# Patient Record
Sex: Male | Born: 1970 | Race: White | Hispanic: No | Marital: Single | Smoking: Never smoker
Health system: Southern US, Community
[De-identification: ages and names within clinical notes are randomized; demographics above are authoritative.]

## PROBLEM LIST (undated history)

## (undated) DIAGNOSIS — I219 Acute myocardial infarction, unspecified: Secondary | ICD-10-CM

## (undated) DIAGNOSIS — I499 Cardiac arrhythmia, unspecified: Secondary | ICD-10-CM

## (undated) DIAGNOSIS — N183 Chronic kidney disease, stage 3 unspecified: Secondary | ICD-10-CM

## (undated) DIAGNOSIS — I82402 Acute embolism and thrombosis of unspecified deep veins of left lower extremity: Secondary | ICD-10-CM

## (undated) DIAGNOSIS — L02619 Cutaneous abscess of unspecified foot: Secondary | ICD-10-CM

## (undated) DIAGNOSIS — L03119 Cellulitis of unspecified part of limb: Secondary | ICD-10-CM

## (undated) DIAGNOSIS — M869 Osteomyelitis, unspecified: Secondary | ICD-10-CM

## (undated) DIAGNOSIS — I10 Essential (primary) hypertension: Secondary | ICD-10-CM

## (undated) DIAGNOSIS — I509 Heart failure, unspecified: Secondary | ICD-10-CM

## (undated) DIAGNOSIS — E119 Type 2 diabetes mellitus without complications: Secondary | ICD-10-CM

## (undated) DIAGNOSIS — K2970 Gastritis, unspecified, without bleeding: Secondary | ICD-10-CM

## (undated) DIAGNOSIS — K219 Gastro-esophageal reflux disease without esophagitis: Secondary | ICD-10-CM

## (undated) HISTORY — PX: FOOT SURGERY: SHX648

---

## 2004-11-06 ENCOUNTER — Emergency Department: Payer: Self-pay | Admitting: Internal Medicine

## 2004-11-06 ENCOUNTER — Other Ambulatory Visit: Payer: Self-pay

## 2004-12-15 ENCOUNTER — Emergency Department: Payer: Self-pay | Admitting: Emergency Medicine

## 2005-01-24 ENCOUNTER — Emergency Department (HOSPITAL_COMMUNITY): Admission: EM | Admit: 2005-01-24 | Discharge: 2005-01-24 | Payer: Self-pay | Admitting: Family Medicine

## 2005-08-31 ENCOUNTER — Emergency Department: Payer: Self-pay | Admitting: Emergency Medicine

## 2006-01-22 ENCOUNTER — Emergency Department: Payer: Self-pay | Admitting: Emergency Medicine

## 2006-04-30 ENCOUNTER — Inpatient Hospital Stay: Payer: Self-pay

## 2006-08-14 ENCOUNTER — Emergency Department: Payer: Self-pay | Admitting: Emergency Medicine

## 2006-11-12 ENCOUNTER — Emergency Department: Payer: Self-pay | Admitting: Emergency Medicine

## 2007-02-02 ENCOUNTER — Emergency Department: Payer: Self-pay | Admitting: Internal Medicine

## 2007-05-23 ENCOUNTER — Emergency Department: Payer: Self-pay | Admitting: Emergency Medicine

## 2007-05-25 ENCOUNTER — Emergency Department: Payer: Self-pay | Admitting: Emergency Medicine

## 2007-06-12 ENCOUNTER — Emergency Department: Payer: Self-pay | Admitting: Emergency Medicine

## 2007-07-15 ENCOUNTER — Inpatient Hospital Stay: Payer: Self-pay | Admitting: Internal Medicine

## 2007-07-15 ENCOUNTER — Other Ambulatory Visit: Payer: Self-pay

## 2008-08-16 ENCOUNTER — Emergency Department: Payer: Self-pay | Admitting: Emergency Medicine

## 2008-09-01 ENCOUNTER — Emergency Department: Payer: Self-pay | Admitting: Emergency Medicine

## 2010-08-09 ENCOUNTER — Emergency Department: Payer: Self-pay | Admitting: Emergency Medicine

## 2010-10-31 ENCOUNTER — Emergency Department: Payer: Self-pay | Admitting: Emergency Medicine

## 2011-09-28 ENCOUNTER — Emergency Department: Payer: Self-pay | Admitting: Unknown Physician Specialty

## 2012-02-21 ENCOUNTER — Ambulatory Visit: Payer: Self-pay

## 2013-06-17 ENCOUNTER — Inpatient Hospital Stay: Payer: Self-pay | Admitting: Family Medicine

## 2013-06-17 LAB — CBC
HGB: 10.8 g/dL — ABNORMAL LOW (ref 13.0–18.0)
MCHC: 34 g/dL (ref 32.0–36.0)
MCV: 90 fL (ref 80–100)
RBC: 3.55 10*6/uL — ABNORMAL LOW (ref 4.40–5.90)
RDW: 12.5 % (ref 11.5–14.5)
WBC: 7.7 10*3/uL (ref 3.8–10.6)

## 2013-06-17 LAB — URINALYSIS, COMPLETE
Bilirubin,UR: NEGATIVE
Blood: NEGATIVE
Glucose,UR: 50 mg/dL (ref 0–75)
Hyaline Cast: 28
Ketone: NEGATIVE
Leukocyte Esterase: NEGATIVE
Nitrite: NEGATIVE
Protein: 75
RBC,UR: <1 /HPF (ref 0–5)
Specific Gravity: 1.015 (ref 1.003–1.030)
Squamous Epithelial: <1
WBC UR: <1 /HPF (ref 0–5)

## 2013-06-17 LAB — IRON AND TIBC
Iron Bind.Cap.(Total): 311 ug/dL (ref 250–450)
Iron: 76 ug/dL (ref 65–175)
Unbound Iron-Bind.Cap.: 235 ug/dL

## 2013-06-17 LAB — COMPREHENSIVE METABOLIC PANEL
Albumin: 3.9 g/dL (ref 3.4–5.0)
Alkaline Phosphatase: 70 U/L (ref 50–136)
Anion Gap: 9 (ref 7–16)
Bilirubin,Total: 0.2 mg/dL (ref 0.2–1.0)
Calcium, Total: 9.8 mg/dL (ref 8.5–10.1)
Chloride: 106 mmol/L (ref 98–107)
Co2: 23 mmol/L (ref 21–32)
Creatinine: 3.28 mg/dL — ABNORMAL HIGH (ref 0.60–1.30)
EGFR (African American): 25 — ABNORMAL LOW
Glucose: 372 mg/dL — ABNORMAL HIGH (ref 65–99)
Osmolality: 316 (ref 275–301)
SGOT(AST): 13 U/L — ABNORMAL LOW (ref 15–37)
SGPT (ALT): 27 U/L (ref 12–78)
Sodium: 138 mmol/L (ref 136–145)

## 2013-06-17 LAB — TROPONIN I: Troponin-I: <0.02 ng/mL

## 2013-06-18 DIAGNOSIS — I6789 Other cerebrovascular disease: Secondary | ICD-10-CM

## 2013-06-18 LAB — BASIC METABOLIC PANEL
BUN: 70 mg/dL — ABNORMAL HIGH (ref 7–18)
Calcium, Total: 8.9 mg/dL (ref 8.5–10.1)
Chloride: 116 mmol/L — ABNORMAL HIGH (ref 98–107)
Creatinine: 2.46 mg/dL — ABNORMAL HIGH (ref 0.60–1.30)
EGFR (Non-African Amer.): 31 — ABNORMAL LOW
Glucose: 126 mg/dL — ABNORMAL HIGH (ref 65–99)
Osmolality: 307 (ref 275–301)
Potassium: 4.4 mmol/L (ref 3.5–5.1)
Sodium: 143 mmol/L (ref 136–145)

## 2013-06-18 LAB — CBC WITH DIFFERENTIAL/PLATELET
Basophil #: 0.1 10*3/uL (ref 0.0–0.1)
Eosinophil #: 0.2 10*3/uL (ref 0.0–0.7)
Eosinophil %: 2.7 %
HCT: 28.9 % — ABNORMAL LOW (ref 40.0–52.0)
MCH: 30.4 pg (ref 26.0–34.0)
MCHC: 34.2 g/dL (ref 32.0–36.0)
MCV: 89 fL (ref 80–100)
Monocyte #: 0.5 x10 3/mm (ref 0.2–1.0)
Neutrophil %: 63.6 %
RBC: 3.25 10*6/uL — ABNORMAL LOW (ref 4.40–5.90)
WBC: 7.5 10*3/uL (ref 3.8–10.6)

## 2013-06-18 LAB — LIPID PANEL
Cholesterol: 164 mg/dL (ref 0–200)
Ldl Cholesterol, Calc: 117 mg/dL — ABNORMAL HIGH (ref 0–100)
Triglycerides: 98 mg/dL (ref 0–200)

## 2013-06-18 LAB — HEMOGLOBIN A1C: Hemoglobin A1C: 8.7 % — ABNORMAL HIGH (ref 4.2–6.3)

## 2014-03-31 ENCOUNTER — Emergency Department: Payer: Self-pay | Admitting: Emergency Medicine

## 2014-03-31 LAB — CBC WITH DIFFERENTIAL/PLATELET
Basophil #: 0 10*3/uL (ref 0.0–0.1)
Basophil %: 0.4 %
EOS PCT: 1.6 %
Eosinophil #: 0.2 10*3/uL (ref 0.0–0.7)
HCT: 37.2 % — AB (ref 40.0–52.0)
HGB: 12.4 g/dL — ABNORMAL LOW (ref 13.0–18.0)
Lymphocyte #: 0.6 10*3/uL — ABNORMAL LOW (ref 1.0–3.6)
Lymphocyte %: 6.5 %
MCH: 29.9 pg (ref 26.0–34.0)
MCHC: 33.3 g/dL (ref 32.0–36.0)
MCV: 90 fL (ref 80–100)
MONOS PCT: 4.8 %
Monocyte #: 0.5 x10 3/mm (ref 0.2–1.0)
Neutrophil #: 8.5 10*3/uL — ABNORMAL HIGH (ref 1.4–6.5)
Neutrophil %: 86.7 %
PLATELETS: 208 10*3/uL (ref 150–440)
RBC: 4.14 10*6/uL — ABNORMAL LOW (ref 4.40–5.90)
RDW: 13.2 % (ref 11.5–14.5)
WBC: 9.8 10*3/uL (ref 3.8–10.6)

## 2014-03-31 LAB — URINALYSIS, COMPLETE
BILIRUBIN, UR: NEGATIVE
Bacteria: NONE SEEN
GLUCOSE, UR: NEGATIVE mg/dL (ref 0–75)
KETONE: NEGATIVE
LEUKOCYTE ESTERASE: NEGATIVE
NITRITE: NEGATIVE
Ph: 5 (ref 4.5–8.0)
Protein: >=500
RBC,UR: 1 /HPF (ref 0–5)
Specific Gravity: 1.016 (ref 1.003–1.030)
Squamous Epithelial: NONE SEEN

## 2014-03-31 LAB — TROPONIN I: Troponin-I: <0.02 ng/mL

## 2014-03-31 LAB — COMPREHENSIVE METABOLIC PANEL
ALBUMIN: 3.7 g/dL (ref 3.4–5.0)
ALT: 26 U/L (ref 12–78)
AST: 21 U/L (ref 15–37)
Alkaline Phosphatase: 81 U/L
Anion Gap: 5 — ABNORMAL LOW (ref 7–16)
BILIRUBIN TOTAL: 0.4 mg/dL (ref 0.2–1.0)
BUN: 36 mg/dL — AB (ref 7–18)
CALCIUM: 8.8 mg/dL (ref 8.5–10.1)
CO2: 22 mmol/L (ref 21–32)
Chloride: 114 mmol/L — ABNORMAL HIGH (ref 98–107)
Creatinine: 2.04 mg/dL — ABNORMAL HIGH (ref 0.60–1.30)
EGFR (African American): 44 — ABNORMAL LOW
EGFR (Non-African Amer.): 38 — ABNORMAL LOW
Glucose: 116 mg/dL — ABNORMAL HIGH (ref 65–99)
Osmolality: 291 (ref 275–301)
POTASSIUM: 4.6 mmol/L (ref 3.5–5.1)
Sodium: 141 mmol/L (ref 136–145)
TOTAL PROTEIN: 8 g/dL (ref 6.4–8.2)

## 2014-03-31 LAB — LIPASE, BLOOD: Lipase: 150 U/L (ref 73–393)

## 2014-07-19 ENCOUNTER — Emergency Department: Payer: Self-pay | Admitting: Emergency Medicine

## 2014-07-26 ENCOUNTER — Inpatient Hospital Stay: Payer: Self-pay | Admitting: Internal Medicine

## 2014-07-26 LAB — POTASSIUM
Potassium: 6.1 mmol/L — ABNORMAL HIGH (ref 3.5–5.1)
Potassium: 5.7 mmol/L — ABNORMAL HIGH (ref 3.5–5.1)

## 2014-07-26 LAB — URINALYSIS, COMPLETE
BACTERIA: NONE SEEN
BLOOD: NEGATIVE
Bilirubin,UR: NEGATIVE
Glucose,UR: NEGATIVE mg/dL (ref 0–75)
Ketone: NEGATIVE
LEUKOCYTE ESTERASE: NEGATIVE
Nitrite: NEGATIVE
PH: 5 (ref 4.5–8.0)
Protein: 100
Specific Gravity: 1.012 (ref 1.003–1.030)
Squamous Epithelial: NONE SEEN
WBC UR: NONE SEEN /HPF (ref 0–5)

## 2014-07-26 LAB — CBC WITH DIFFERENTIAL/PLATELET
BASOS PCT: 1.1 %
Basophil #: 0.1 10*3/uL (ref 0.0–0.1)
Eosinophil #: 0.2 10*3/uL (ref 0.0–0.7)
Eosinophil %: 2.6 %
HCT: 31.4 % — ABNORMAL LOW (ref 40.0–52.0)
HGB: 9.8 g/dL — AB (ref 13.0–18.0)
LYMPHS ABS: 1.4 10*3/uL (ref 1.0–3.6)
Lymphocyte %: 23.8 %
MCH: 28.9 pg (ref 26.0–34.0)
MCHC: 31.2 g/dL — AB (ref 32.0–36.0)
MCV: 93 fL (ref 80–100)
Monocyte #: 0.4 x10 3/mm (ref 0.2–1.0)
Monocyte %: 7.1 %
NEUTROS ABS: 4 10*3/uL (ref 1.4–6.5)
NEUTROS PCT: 65.4 %
Platelet: 201 10*3/uL (ref 150–440)
RBC: 3.39 10*6/uL — ABNORMAL LOW (ref 4.40–5.90)
RDW: 13.5 % (ref 11.5–14.5)
WBC: 6.1 10*3/uL (ref 3.8–10.6)

## 2014-07-26 LAB — BASIC METABOLIC PANEL
Anion Gap: 6 — ABNORMAL LOW (ref 7–16)
BUN: 60 mg/dL — ABNORMAL HIGH (ref 7–18)
Calcium, Total: 8.6 mg/dL (ref 8.5–10.1)
Chloride: 114 mmol/L — ABNORMAL HIGH (ref 98–107)
Co2: 21 mmol/L (ref 21–32)
Creatinine: 4.03 mg/dL — ABNORMAL HIGH (ref 0.60–1.30)
EGFR (African American): 21 — ABNORMAL LOW
GFR CALC NON AF AMER: 17 — AB
GLUCOSE: 72 mg/dL (ref 65–99)
OSMOLALITY: 297 (ref 275–301)
Potassium: 6.8 mmol/L (ref 3.5–5.1)
Sodium: 141 mmol/L (ref 136–145)

## 2014-07-26 LAB — PROTIME-INR
INR: 1
PROTHROMBIN TIME: 12.7 s (ref 11.5–14.7)

## 2014-07-26 LAB — TROPONIN I: Troponin-I: <0.02 ng/mL

## 2014-07-26 LAB — CK TOTAL AND CKMB (NOT AT ARMC)
CK, TOTAL: 281 U/L
CK-MB: 5.6 ng/mL — ABNORMAL HIGH (ref 0.5–3.6)

## 2014-07-27 LAB — BASIC METABOLIC PANEL
Anion Gap: 6 — ABNORMAL LOW (ref 7–16)
BUN: 49 mg/dL — AB (ref 7–18)
CO2: 24 mmol/L (ref 21–32)
CREATININE: 3.26 mg/dL — AB (ref 0.60–1.30)
Calcium, Total: 8.6 mg/dL (ref 8.5–10.1)
Chloride: 114 mmol/L — ABNORMAL HIGH (ref 98–107)
EGFR (Non-African Amer.): 22 — ABNORMAL LOW
GFR CALC AF AMER: 27 — AB
Glucose: 84 mg/dL (ref 65–99)
Osmolality: 299 (ref 275–301)
Potassium: 5 mmol/L (ref 3.5–5.1)
Sodium: 144 mmol/L (ref 136–145)

## 2014-07-27 LAB — CBC WITH DIFFERENTIAL/PLATELET
Basophil #: 0.1 10*3/uL (ref 0.0–0.1)
Basophil %: 1 %
Eosinophil #: 0.2 10*3/uL (ref 0.0–0.7)
Eosinophil %: 3.2 %
HCT: 27.3 % — AB (ref 40.0–52.0)
HGB: 8.6 g/dL — ABNORMAL LOW (ref 13.0–18.0)
LYMPHS PCT: 33.8 %
Lymphocyte #: 1.9 10*3/uL (ref 1.0–3.6)
MCH: 29 pg (ref 26.0–34.0)
MCHC: 31.3 g/dL — ABNORMAL LOW (ref 32.0–36.0)
MCV: 93 fL (ref 80–100)
MONOS PCT: 7.6 %
Monocyte #: 0.4 x10 3/mm (ref 0.2–1.0)
Neutrophil #: 3.1 10*3/uL (ref 1.4–6.5)
Neutrophil %: 54.4 %
Platelet: 164 10*3/uL (ref 150–440)
RBC: 2.95 10*6/uL — AB (ref 4.40–5.90)
RDW: 13.5 % (ref 11.5–14.5)
WBC: 5.7 10*3/uL (ref 3.8–10.6)

## 2014-07-27 LAB — FERRITIN
FERRITIN (ARMC): 83 ng/mL (ref 8–388)
FERRITIN (ARMC): 84 ng/mL (ref 8–388)

## 2014-07-27 LAB — IRON AND TIBC
IRON: 52 ug/dL — AB (ref 65–175)
IRON: 66 ug/dL (ref 65–175)
Iron Bind.Cap.(Total): 239 ug/dL — ABNORMAL LOW (ref 250–450)
Iron Bind.Cap.(Total): 212 ug/dL — ABNORMAL LOW (ref 250–450)
Iron Saturation: 22 %
Iron Saturation: 31 %
UNBOUND IRON-BIND. CAP.: 187 ug/dL
Unbound Iron-Bind.Cap.: 146 ug/dL

## 2014-07-27 LAB — PHOSPHORUS: Phosphorus: 3.3 mg/dL (ref 2.5–4.9)

## 2014-07-28 LAB — RENAL FUNCTION PANEL
ALBUMIN: 2.8 g/dL — AB (ref 3.4–5.0)
Anion Gap: 5 — ABNORMAL LOW (ref 7–16)
BUN: 36 mg/dL — AB (ref 7–18)
CALCIUM: 8.2 mg/dL — AB (ref 8.5–10.1)
CO2: 25 mmol/L (ref 21–32)
Chloride: 113 mmol/L — ABNORMAL HIGH (ref 98–107)
Creatinine: 2.53 mg/dL — ABNORMAL HIGH (ref 0.60–1.30)
GFR CALC AF AMER: 36 — AB
GFR CALC NON AF AMER: 29 — AB
Glucose: 144 mg/dL — ABNORMAL HIGH (ref 65–99)
OSMOLALITY: 296 (ref 275–301)
POTASSIUM: 5.3 mmol/L — AB (ref 3.5–5.1)
Phosphorus: 3.8 mg/dL (ref 2.5–4.9)
SODIUM: 143 mmol/L (ref 136–145)

## 2014-07-28 LAB — HEMOGLOBIN A1C: Hemoglobin A1C: 7 % — ABNORMAL HIGH (ref 4.2–6.3)

## 2014-07-29 LAB — BASIC METABOLIC PANEL
Anion Gap: 6 — ABNORMAL LOW (ref 7–16)
BUN: 34 mg/dL — AB (ref 7–18)
CALCIUM: 8.8 mg/dL (ref 8.5–10.1)
CO2: 25 mmol/L (ref 21–32)
Chloride: 110 mmol/L — ABNORMAL HIGH (ref 98–107)
Creatinine: 2.4 mg/dL — ABNORMAL HIGH (ref 0.60–1.30)
EGFR (Non-African Amer.): 31 — ABNORMAL LOW
GFR CALC AF AMER: 38 — AB
Glucose: 207 mg/dL — ABNORMAL HIGH (ref 65–99)
OSMOLALITY: 295 (ref 275–301)
Potassium: 5.4 mmol/L — ABNORMAL HIGH (ref 3.5–5.1)
SODIUM: 141 mmol/L (ref 136–145)

## 2014-07-29 LAB — PROTEIN / CREATININE RATIO, URINE
Creatinine, Urine: 85.9 mg/dL (ref 30.0–125.0)
PROTEIN/CREAT. RATIO: 896 mg/g{creat} — AB (ref 0–200)
Protein, Random Urine: 77 mg/dL — ABNORMAL HIGH (ref 0–12)

## 2014-07-29 LAB — PROTEIN ELECTROPHORESIS(ARMC)

## 2014-07-31 LAB — UR PROT ELECTROPHORESIS, URINE RANDOM

## 2014-07-31 LAB — CULTURE, BLOOD (SINGLE)

## 2014-09-18 ENCOUNTER — Ambulatory Visit: Payer: Self-pay | Admitting: Nephrology

## 2014-09-18 LAB — BASIC METABOLIC PANEL
ANION GAP: 7 (ref 7–16)
BUN: 52 mg/dL — ABNORMAL HIGH (ref 7–18)
CALCIUM: 9 mg/dL (ref 8.5–10.1)
CREATININE: 2.5 mg/dL — AB (ref 0.60–1.30)
Chloride: 111 mmol/L — ABNORMAL HIGH (ref 98–107)
Co2: 22 mmol/L (ref 21–32)
GFR CALC AF AMER: 36 — AB
GFR CALC NON AF AMER: 30 — AB
Glucose: 270 mg/dL — ABNORMAL HIGH (ref 65–99)
Osmolality: 303 (ref 275–301)
Potassium: 5.6 mmol/L — ABNORMAL HIGH (ref 3.5–5.1)
Sodium: 140 mmol/L (ref 136–145)

## 2014-09-28 ENCOUNTER — Ambulatory Visit: Payer: Self-pay | Admitting: Pain Medicine

## 2014-10-06 ENCOUNTER — Ambulatory Visit: Payer: Self-pay | Admitting: Internal Medicine

## 2014-10-07 ENCOUNTER — Ambulatory Visit: Payer: Self-pay | Admitting: Pain Medicine

## 2014-10-14 ENCOUNTER — Ambulatory Visit: Payer: Self-pay | Admitting: Pain Medicine

## 2014-11-02 ENCOUNTER — Ambulatory Visit: Payer: Self-pay | Admitting: Internal Medicine

## 2014-11-09 ENCOUNTER — Emergency Department: Payer: Self-pay | Admitting: Student

## 2015-01-22 NOTE — H&P (Signed)
PATIENT NAME:  Juan Roberson, Juan Roberson MR#:  [id number] DATE OF BIRTH:  May 16, 1971  DATE OF ADMISSION:  06/17/2013  REFERRING PHYSICIAN:  Dr. Chaney Malling  PRIMARY CARE PHYSICIAN:  Dr. Loma Sender  CHIEF COMPLAINT: Slurred speech, weakness of the right side of the body lasting for 5 minutes 3 times today.   HISTORY OF PRESENT ILLNESS: A very nice 44 year old gentleman with history of diabetes. He comes in today with the main complaint of being at work, being his normal self this morning, and around 1:15 started developing acute numbness at the level of his right hand. He stopped working to rest, sit down, and then his right arm became numb completely as well. He went to talk to one of his coworkers, and they noticed that his speech was very slurred. He was not able to put words together for a little while, and then while he was standing, his right leg became numb as well. This episode lasted 5 minutes, and then within the next hour repeated 3 times again to the point that he was not able to continue his labor. He was very concerned, and came to the ER. In the ER department, he was evaluated. His CT scan of the head did not show any acute abnormalities. He had normal cardiac enzymes, but his creatinine was 3.28. His previous creatinine in 2012 was 1.3. The patient states that his blood sugars have not been very well-controlled, despite the fact that he has been taking is Glyburide. Within the last 6 months, he has developed some edema of the lower extremities. He asked his mother-in-law what to do, and she offered him her water pills. He started taking it for 3 to 4 days, and he started urinating a lot. He did not consult his primary care physician about this action. The patient overall says at this moment he is back to his normal self. He has not had any more episodes of slurred speech or right-sided numbness or weakness, and but he is concerned about this situation. The patient is admitted for evaluation and  observation of these issues, and also for treatment of his acute kidney injury.   REVIEW OF SYSTEMS:  A 12-system review of systems.  CONSTITUTIONAL: Denies fever, fatigue, weight loss or weight gain.  EYES: No double vision, blurry vision or erythema.  ENT: No difficulty swallowing. No tinnitus.  RESPIRATORY: No shortness of breath, cough, wheezing, or hemoptysis.  CARDIOVASCULAR: No chest pain, orthopnea or arrhythmia. The patient has edema of the lower extremities that apparently has been really severe to the point that he is not able to put on his shoes, that resolved after taking the medication given by his mother-in-law, which apparently might be Lasix.  GENITOURINARY: No dysuria, hematuria or changes in frequency.  GASTROINTESTINAL: No nausea, vomiting, or diarrhea. The patient had mild constipation within the last week.  ENDOCRINE: No polyuria, polydipsia or polyphagia. His blood sugars are not well-controlled. He does not have any thyroid disease.  HEMATOLOGIC AND LYMPHATIC: No anemia, easy bruising or swollen glands.  MUSCULOSKELETAL: No significant joint pain, gout, or deformity.  NEUROLOGIC: No numbness, tingling. No vertigo, no ataxia. Positive TIA episode mentioned above today. No memory loss. No seizures.  PSYCHIATRIC: No anxiety, depression or insomnia.   PAST MEDICAL HISTORY: 1.  Type 2 non-insulin-dependent diabetes.  2.  Apparently the patient was taking blood pressure medications that he stopped by himself.  3.  He denies any other medical problems.   ALLERGIES:  No known drug  allergies.   MEDICATIONS:  Glyburide 1 p.o. twice daily. He does not remember what dose he is taking.   FAMILY HISTORY:  Positive for MI in his father, who has had quadruple bypass. Cancer runs in his family. Lung cancer in his brother, who was a smoker. Sister had breast cancer. His mom had pancreatic cancer. She was not a drinker. Colon cancer also runs in some of his family members, uncles  especially.   SOCIAL HISTORY:  He is a nonsmoker, never has. He drinks very seldom. He lives with his wife. 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   PHYSICAL EXAMINATION:  VITAL SIGNS:  Blood pressure is 178/76, respirations 18 to 21, heart rate 82 to 92, temperature 98.3, oxygen saturation 100% on room air.  GENERAL: The patient is alert, oriented x 3, no acute distress. No respiratory distress. Hemodynamically stable.  HEENT: Pupils are equal and reactive. Extraocular movements are intact. Mucosa are moist. Anicteric sclerae. Pink conjunctivae. No oral lesions. No oropharyngeal exudates.  NECK:  Supple. No JVD. No thyromegaly. No adenopathy. No rigidity.  CARDIOVASCULAR:  Regular rate and rhythm. No murmurs, rubs or gallops.  LUNGS: Clear, without any wheezing or crepitus. No use of accessory muscles.  ABDOMEN:  Soft, nontender, nondistended. No hepatosplenomegaly. No masses. Bowel sounds are positive.  GENITAL: Exam is deferred.  EXTREMITIES:  At this moment, no edema is seen in his lower extremities. No cyanosis, no clubbing.  VASCULAR:  Good pulses +2. Capillary refill is less than 3.  SKIN:  No rashes, petechiae or ulcers.  MUSCULOSKELETAL:  No joint abnormalities or deformity. No swelling of joints.  LYMPHATIC:  Negative for lymphadenopathy in the neck or supraclavicular areas.  NEUROLOGIC: Cranial nerves II through XII intact. Strength is 5/5 in all 4 extremities. No facial droop. Tongue is central. Uvula is central. The patient is able to open his eyes and close them normally. Sensation over the face is normal. The patient is able to raise his shoulders without problem, raise his eyebrows without any asymmetry. His DTRs are +2. Cerebellar test:  Finger-to-nose normal in both extremities.  PSYCHIATRIC:  Negative for significant abnormalities.    EKG:  Normal sinus rhythm. No ST depression or elevation. Right bundle branch block.   LAB WORK:  His blood sugars are  375, BUN is 84, creatinine is 3.28. Previous creatinine 2 years ago was 1.31. Sodium 138, potassium 4.9, AST 13. Other LFTs were normal limits. White count is 7.7, hemoglobin is 10, platelet count 201. Urinalysis negative for signs of urinary tract infection.   ASSESSMENT AND PLAN: This is a very nice 44 year old gentleman with history of uncontrolled diabetes and hypertension, not taking medication for high blood pressure, who had  edema of the lower extremities, took some Lasix. Now comes with a transient ischemic attack.  1.  Acute kidney injury. This is likely the cause of his transient ischemic attack. The patient had a significant increase in his creatinine, which is likely secondary to patient being dehydrated and on top of that getting Lasix. The Lasix was not prescribed for anything. He got it from his mother-in-law. This is likely accounting for his acute kidney injury, and acute kidney injury is likely the reason for his TIA, for decreased intravascular volume depletion.   2.  The patient is admitted for treatment of this. IV fluids given, 100 mL an hour. Follow up creatinine in the morning.   3.  Transient ischemic attack. The patient had 3 episodes  of what looks like TIA symptoms, right-sided weakness, slurred speech.   4.  The patient is 17. He is not a smoker, but he is a diabetic. Likely has severe hypertension, likely has high cholesterol. He is not very compliant with treatment or following with his doctor, for what we are going to do a lipid profile and a hemoglobin A1c.   5.  The patient is going to have neuro checks. He is not going to have an MRI. I do not think it is  necessary at this moment. His symptoms are pretty straightforward, lasting only for 5 minutes. His CT is normal.   6.  I explained to the patient he just needs some studies to evaluate possible causes of the TIA, including ultrasound of the carotid arteries, and echocardiogram. The echocardiogram mostly refers to  his edema of the lower extremities to make sure he does not have CHF, but also to evaluate possibility of the patient having some turbulence of his atrium that could cause blood clots to form.   7.  The patient is going to start on aspirin, and is going to be monitored closely.   8.  Allow his blood pressure to be little bit high, but treat any high blood pressures above 180.   9.  Diabetes. Continue glyburide. Hemoglobin A1c ordered. Insulin sliding scale ordered. The patient has uncontrolled diabetes. Consider increasing the dose.   10.  Hypertension. Hydralazine p.r.n. Monitor closely.   11.  Anemia. The patient has normocytic anemia, unknown cause. Could be secondary to chronic kidney disease with diabetes and maybe some other issues. Cannot rule out the possibility of GI bleeding. We are going to get Guaiac screening for colon cancer. The patient is a colonoscopy patient. Will refer him at discharge.   12. Deep vein thrombosis prophylaxis. I am going to start him on heparin, GI prophylaxis and Protonix.   I spent about 45 minutes with this patient.    ____________________________ Felipa Furnace, MD rsg:mr D: 06/17/2013 19:10:54 ET T: 06/17/2013 19:48:53 ET JOB#: [id number]  cc: Felipa Furnace, MD, <Dictator> Sameera Betton Juanda Chance MD ELECTRONICALLY SIGNED 06/26/2013 10:48

## 2015-01-22 NOTE — Discharge Summary (Signed)
PATIENT NAME:  Juan Roberson, Juan Roberson DATE OF BIRTH:  02/26/1971  DATE OF ADMISSION:  06/17/2013 DATE OF DISCHARGE:  06/18/2013  REASON FOR ADMISSION: TIA symptoms with slurred speech and right-sided numbness.   DISCHARGE DIAGNOSES: 1. Acute kidney injury due to the patient taking diuretics that were not prescribed to him.  2. Transient ischemic attack.  3. Transient ischemic attack likely secondary to dehydration and decreased intravascular volume depletion.  4. Diabetes, uncontrolled.  5. Hypertension, not adherent to treatment.  6. Edema of the lower extremities.  7. Normocytic anemia.  8. Hyperlipidemia with LDL of 117 and decrease HDL of 27.   DISPOSITION: Home.   MEDICATIONS AT DISCHARGE: Glyburide 5 mg in the morning 2.5 mg in the evening; aspirin 81 mg a day; niacin 500 mg at bedtime.   FOLLOW-UP: With Dr. Loma Senderharles Phillips in the next 1 to 2 weeks and referral to GI for possible colonoscopy for evaluation of anemia.   HOSPITAL COURSE: This is a very nice 44 year old gentleman who was admitted with a history of having slurred speech, weakness of the right side of his body lasting 5 minutes and he had 3 different episodes. The episode started around 1:15 in the afternoon when he was at work and progressed from numbness of the hand up to numbness of the whole arm, then slurred speech and then concluded with numbness of the right leg as well altogether, and it happened 3 times for which the patient came to the ER. In the ER his CT scan did not show any abnormalities, but he was found to have a creatinine of 3.28. His previous creatinine in 2012 was 1.3. The patient states that his blood sugars have been increasing. He has not been following up on a regular basis. The patient had hemoglobin that was around 10 and he did not have any previous history of anemia. The patient was admitted with the diagnosis of TIA and acute kidney injury.   As far as important results we could  conclude that his creatinine of 3.28 dropped to 2.46, not back to normal, but the patient wanted to be discharged to follow-up with Dr. Vear ClockPhillips for recheck of his labs. His LDL cholesterol was 117. His HDL was 27. His hemoglobin A1c was 8.7. His liver enzymes were normal. His troponins were negative. His hemoglobin at discharge is 9.9, platelet count is 169. White count was normal. Urinalysis was normal.  EKG showed normal sinus rhythm. Echo Doppler did not show any major abnormalities. The patient is to follow-up with primary care physician.   PROBLEMS: 1. Acute kidney injury. The patient was treated with IV fluids. His creatinine came down at around 2 for discharge. Not back to his baseline but the patient feels comfortable going back home. Apparently, the patient was taking Lasix that was given to him by his mother-in-law without any previous prescription or doctor counseling. The patient likely got dehydrated due to increased urination. The patient took this because he had significant edema of the lower extremities and he was tired of this. For that reason he took several doses of Lasix, although he has been counseled as far as not taking medications that do not belong to him, only as they are prescribed.  2. As far as his diabetes, it is uncontrolled. His hemoglobin A1c is 8.7. He needs to follow up with Dr. Lacie ScottsNiemeyer for adjustment of his medications dose. 3.  As far as his hypertension, we recommend the patient to be on an  ACE inhibitor. We did not start it during this hospitalization because of his acute kidney injury, but he could start it once his creatinine is back to baseline. The patient needs an ACE inhibitor due to his history of diabetes.  4. Anemia. The patient has normocytic anemia. While this is unknown to him, it could be secondary to progression of chronic kidney disease, although again, we do not have a baseline of  what his creatinine is. We recommend an erythropoietin level at some  point, but first rule out the possibility of colon cancer with blood in stool screen. The patient did not have a bowel movement for which we could not get a sample, and he did not like to have a rectal exam.  The patient is recommended to followup with Dr. Lacie Scotts for this, and also referred to GI for colonoscopy for evaluation as he 44 years old and has some family history of colon cancer.   TIME SPENT: I spent about 45 minutes discharging this patient.    ____________________________ Felipa Furnace, MD rsg:sg D: 06/19/2013 07:05:00 ET T: 06/19/2013 08:54:32 ET JOB#: [id number]  cc: Marcine Matar., MD Felipa Furnace, MD, <Dictator>    Kuzey Ogata Juanda Chance MD ELECTRONICALLY SIGNED 06/26/2013 10:52

## 2015-01-23 NOTE — H&P (Signed)
PATIENT NAME:  Juan Roberson, ANGUS MR#:  [id number] DATE OF BIRTH:  [birth year]  DATE OF ADMISSION:  07/26/2014  PRIMARY CARE PHYSICIAN: Corky Downs, MD  EMERGENCY ROOM PHYSICIAN: Sheryl L. Mindi Junker, MD  CHIEF COMPLAINT: Muscle pain, weakness, falls and difficulty breathing.   HISTORY OF PRESENT ILLNESS: The patient is a 44 year old male patient who was seen in the Emergency Room a week ago for a knee infection, was given Bactrim. The patient seen in the Emergency Room on October 19th, then in the ER the patient received Bactrim for knee infection, and the patient has been taking Bactrim since 19th and finished all the Bactrim yesterday, and the patient noted to have decreased urination since yesterday associated with muscle cramps and also decreased p.o. intake today. The patient's blood sugar was 47 when he came into the Emergency Room. He also was found to have acute renal failure with a BUN of 60, creatinine 4.03 and potassium 6.8. The patient's creatinine in June was 2.04. The patient has diabetes and has CKD stage 3, follows up with Dr. Juel Burrow. The patient is alert, oriented and had a bladder scan show 450 mL and voided 400 mL of urine just now, and the patient is alert, oriented, complains of generalized body pains but denies any trouble breathing. Does have some redness over the skin on the left knee. The patient received calcium gluconate 1 gram, dextrose 25 grams and 10 units of regular insulin, sodium bicarbonate 1 amp and Kayexalate 30 grams. The patient is going for an ultrasound again and repeat potassium levels are drawn.   PAST MEDICAL HISTORY: Significant for hypertension and also diabetes and CKD stage 3, diabetic neuropathy.  ALLERGIES: No known allergies.   SOCIAL HISTORY: No smoking. No drinking. No drugs. Works at [employer].   MEDICATIONS: Glyburide 2.5 mg at night, 5 mg in the morning. Lisinopril 40 mg daily, Neurontin 300 mg at bedtime.   FAMILY HISTORY: The patient's  mother had diabetes and sister has diabetes.   PAST SURGICAL HISTORY: None.   REVIEW OF SYSTEMS:  CONSTITUTIONAL: Feels fatigue, weakness. No fever.  EYES: No blurred vision.  EARS, NOSE AND THROAT: No tinnitus. No ear pain. No epistaxis. No difficulty swallowing.  RESPIRATORY: No cough. No wheezing.  CARDIOVASCULAR: No chest pain. No orthopnea. No palpitations.  GASTROINTESTINAL: The patient complains of nausea today. Denies any abdominal pain. No hematemesis.  GENITOURINARY: The patient has decreased urination since last night and denies any burning urination.  ENDOCRINE: No polyuria or nocturia. Does have diabetes and diabetic nephropathy.  SKIN: Does have redness on the left knee area on the dorsal aspect and the patient says that it is improved a lot but still red and tender to touch.  NEUROLOGIC: Has a history of neuropathy.  PSYCHIATRIC: No anxiety or insomnia.   PHYSICAL EXAMINATION:  VITAL SIGNS: Temperature 99.2, heart rate 87, blood pressure 189/90, saturation is 96% on room air. GENERAL: Alert, awake, oriented. Male, well developed, well nourished, not in distress, answering questions appropriately.  HEAD: Atraumatic, normocephalic.  EYES: Pupils equally reacting to light. Extraocular movements are intact.  EARS, NOSE AND THROAT: No tympanic membrane congestion. No turbinate hypertrophy. No oropharyngeal erythema.  NECK: Supple. No JVD. No carotid bruit.  CARDIOVASCULAR: S1, S2, regular. PMI not displaced.  LUNGS: Clear to auscultation. No wheeze. No rales.  ABDOMEN: Soft, nontender, nondistended. Bowel sounds present. No organomegaly. No hernias.  SKIN: Inspection normal.  MUSCULOSKELETAL: There is swelling and redness of the left knee, dorsal aspect,  mainly involving the skin, tenderness also is slightly present.  NEUROLOGIC: Cranial nerves II-XII intact. Power 5/5 in upper and lower extremities. Sensation is intact. Deep tendon reflexes 2+ bilaterally.  PSYCHIATRIC: Mood  and affect are within normal limits.   LABORATORY DATA: Blood glucose initially was 47 and then 46; the most recent one at 7:10 was 125. ABG: PH 7.240, pCO2 of 48 and the patient's bicarbonate 20.6. Chest x-ray: No acute findings. WBC 6.1, hemoglobin 9.8, hematocrit 31.4, platelets 201,000. Electrolytes: Sodium is 141, potassium 6.8, chloride 114, bicarbonate 21, BUN 60, creatinine 4.03, glucose 72, GFR 70. Troponin less than 0.02, INR 1.    EKG showed normal sinus rhythm with normal EKG. no ST-T changes. No tall T waves.   ASSESSMENT AND PLAN:  1.  He is a 44 year old male with acute on chronic renal failure secondary to Bactrim and also lisinopril. The patient was given Bactrim for knee infection and the patient likely has drug-induced renal failure with hyperkalemia. Admit him to telemetry, start him on normal saline at 100 mL/hour, potassium shifting measures are already done and potassium levels are redrawn, recheck and give him further doses of Kayexalate and potassium gluconate. I have consulted nephrology and hold nephrotoxic medications, lisinopril and Bactrim, at this time.  2.  Hypoglycemia secondary to poor p.o. intake and also glipizide induced. Hold the glipizide and hyperglycemia seems to be resolving. Check Accu-Cheks every 3 hours without sliding scale coverage, and the patient will get sliding scale coverage if the  BG ) level is more than 200 consecutively for 2 times.  3.  Hypertension. Blood pressure was elevated. We could use hydralazine 25 mg t.i.d. and uptitrate as needed.  4.  Left knee infection. Has temperature 99.2 and normal white count. The patient is given a dose of Dilaudid. We will continue Tylenol as needed and hold off on the antibiotics at this time. The patient will be signed out to Dr. Juel BurrowMasoud.   TIME SPENT: Fifty-five minutes.    ____________________________ Katha HammingSnehalatha Lavine Hargrove, MD sk:TT D: 07/26/2014 19:33:25 ET T: 07/26/2014 20:25:50  ET JOB#: [id number]  cc: Katha HammingSnehalatha Resean Brander, MD, <Dictator> Katha HammingSNEHALATHA Tomoko Sandra MD ELECTRONICALLY SIGNED 08/31/2014 13:23

## 2015-01-23 NOTE — Discharge Summary (Signed)
PATIENT NAME:  Juan Roberson, Juan Roberson MR#:  [id number] DATE OF BIRTH:  05-26-1971  DATE OF ADMISSION:  07/26/2014 DATE OF DISCHARGE:  07/29/2014  ADMITTING DIAGNOSIS: Muscle pain, weakness, difficulty with breathing.   DISCHARGE DIAGNOSES:  1. Muscle pain, weakness, difficulty with breathing, felt to be due to acute on chronic renal failure.  2. Acute on chronic renal failure, felt to be due to possible Bactrim therapy.  3. Hyperkalemia again a combination of Bactrim therapy and hyperkalemia.  4. Hypoglycemia due to poor p.o. intake and acute renal failure, now blood sugars elevated.  5. Accelerated hypertension. Blood pressure improved with adjustment of medication.  6. Left knee infection now improved on antibiotics.  7. Diabetes.  8. Diabetic neuropathy.  9. Likely anemia or chronic disease.    CONSULTANTS: Munsoor Lateef, MD.   PERTINENT LABS AND EVALUATIONS: Admitting glucose 72, BUN 60, creatinine 4.01, sodium 141, potassium 6.8, chloride 114, CO2 of 21, iron level was 52, ferritin 83. Troponin less than 0.02. WBC 6.1, hemoglobin 9.8, platelet count 201.  Chest x-ray showed low volumes, no acute abnormality. Renal ultrasound showed negative for hydronephrosis.   HOSPITAL COURSE: Please refer to H and P done by the admitting physician. The patient is a 44 year old white male with history of chronic kidney disease stage III who was recently started on Bactrim therapy for left leg cellulitis. The patient presented to the ED with complaint of muscle aches and weakness. He was noted to have acute renal failure, hyperkalemia due to these symptoms, we were asked to admit the patient. The patient's Bactrim was discontinued. He was given IV fluids. He was given Kayexalate for the hyperkalemia, supportive care was provided nephrotoxins were avoided. With these treatments, his renal function improved significantly. His renal function is close to baseline. He was seen by nephrology who agreed with the plan.  The patient also was noted to have hypoglycemia due to poor p.o. intake and acute renal failure. The patient's blood sugars are now elevated. The patient also was noted to have accelerated hypertension. Blood pressure medications were adjusted. He is doing much better at this point and is stable for discharge.   DISCHARGE MEDICATIONS: Glyburide 5 mg 1 tab in the morning 1/2 tab in the evening, lisinopril 40 mg 1 tab p.o. daily, gabapentin 300 at bedtime, hydralazine 100 one tab p.o. t.i.d., Ceftin 500 mg 1 tab p.o. b.i.d. x 5 days.   DIET: Low-sodium, low-fat, low-cholesterol, carbohydrate.   ACTIVITY: As tolerated. Follow-up primary MD in 1 to 2 weeks. Follow up with Dr. Mady HaagensenMunsoor Lateef in 2 to 4 weeks    TIME SPENT: 35 minutes.    ____________________________ Lacie ScottsShreyang H. Allena KatzPatel, MD shp:JT D: 07/30/2014 14:24:42 ET T: 07/30/2014 23:29:41 ET JOB#: [id number]  cc: Juan Juan H. Allena KatzPatel, MD, <Dictator> Charise CarwinSHREYANG H Alida Greiner MD ELECTRONICALLY SIGNED 08/08/2014 8:38

## 2015-04-02 ENCOUNTER — Emergency Department: Payer: Worker's Compensation

## 2015-04-02 ENCOUNTER — Emergency Department
Admission: EM | Admit: 2015-04-02 | Discharge: 2015-04-02 | Disposition: A | Discharge status: Home / Self Care | Payer: Worker's Compensation | Attending: Student | Admitting: Student

## 2015-04-02 DIAGNOSIS — Y9389 Activity, other specified: Secondary | ICD-10-CM | POA: Diagnosis not present

## 2015-04-02 DIAGNOSIS — S91332A Puncture wound without foreign body, left foot, initial encounter: Secondary | ICD-10-CM | POA: Diagnosis not present

## 2015-04-02 DIAGNOSIS — L03116 Cellulitis of left lower limb: Secondary | ICD-10-CM

## 2015-04-02 DIAGNOSIS — Y288XXA Contact with other sharp object, undetermined intent, initial encounter: Secondary | ICD-10-CM | POA: Diagnosis not present

## 2015-04-02 DIAGNOSIS — Y998 Other external cause status: Secondary | ICD-10-CM | POA: Diagnosis not present

## 2015-04-02 DIAGNOSIS — S99922A Unspecified injury of left foot, initial encounter: Secondary | ICD-10-CM | POA: Diagnosis present

## 2015-04-02 DIAGNOSIS — Y9289 Other specified places as the place of occurrence of the external cause: Secondary | ICD-10-CM | POA: Diagnosis not present

## 2015-04-02 DIAGNOSIS — E119 Type 2 diabetes mellitus without complications: Secondary | ICD-10-CM | POA: Insufficient documentation

## 2015-04-02 MED ORDER — OXYCODONE-ACETAMINOPHEN 5-325 MG PO TABS
1.0000 | ORAL_TABLET | ORAL | Status: DC | PRN
  Administered 2015-04-02: 1 via ORAL

## 2015-04-02 MED ORDER — AMOXICILLIN-POT CLAVULANATE 875-125 MG PO TABS: 1.0000 | ORAL_TABLET | Freq: Two times a day (BID) | ORAL | Status: DC

## 2015-04-02 MED ORDER — OXYCODONE-ACETAMINOPHEN 5-325 MG PO TABS: 1.0000 | ORAL_TABLET | Freq: Four times a day (QID) | ORAL | Status: DC | PRN

## 2015-04-02 MED ORDER — CLINDAMYCIN PHOSPHATE 900 MG/6ML IJ SOLN
600.0000 mg | Freq: Once | INTRAMUSCULAR | Status: AC
  Administered 2015-04-02: 600 mg via INTRAMUSCULAR
  Filled 2015-04-02: qty 6

## 2015-04-02 MED ORDER — OXYCODONE-ACETAMINOPHEN 5-325 MG PO TABS
ORAL_TABLET | ORAL | Status: AC
  Administered 2015-04-02: 1 via ORAL
  Filled 2015-04-02: qty 1

## 2015-04-02 NOTE — ED Provider Notes (Signed)
CSN: [id number]     Arrival date & time 04/02/15  1804 History   First MD Initiated Contact with Patient 04/02/15 1835     Chief Complaint  Patient presents with  . Foreign Body in Skin     (Consider location/radiation/quality/duration/timing/severity/associated sxs/prior Treatment) HPI  44 year old male presents today for evaluation of left foot pain and redness. Patient states one day ago he stepped onto a screw and had to remove the screw himself. He was able to ambulate and was not having much pain until today, patient noticed significant redness throughout the plantar aspect of the foot as well as the dorsal aspect of the foot. Patient states he removed all the screw. His tetanus is up-to-date, within the last 2 years. He has been ambulatory today with 8 out of 10 pain. Pain is on the plantar aspect of the left foot. He has a history of diabetes. No neuropathy. Last hemoglobin A1c was 7.6.   No past medical history on file. No past surgical history on file. No family history on file. History  Substance Use Topics  . Smoking status: Not on file  . Smokeless tobacco: Not on file  . Alcohol Use: Not on file    Review of Systems  Constitutional: Negative.  Negative for fever, chills, activity change and appetite change.  HENT: Negative for congestion, ear pain, mouth sores, rhinorrhea, sinus pressure, sore throat and trouble swallowing.   Eyes: Negative for photophobia, pain and discharge.  Respiratory: Negative for cough, chest tightness and shortness of breath.   Cardiovascular: Negative for chest pain and leg swelling.  Gastrointestinal: Negative for nausea, vomiting, abdominal pain, diarrhea and abdominal distention.  Genitourinary: Negative for dysuria and difficulty urinating.  Musculoskeletal: Negative for back pain, arthralgias and gait problem.  Skin: Positive for rash and wound.  Neurological: Negative for dizziness and headaches.  Hematological: Negative for adenopathy.   Psychiatric/Behavioral: Negative for behavioral problems and agitation.      Allergies  Sulfur  Home Medications   Prior to Admission medications   Medication Sig Start Date End Date Taking? Authorizing Provider  amoxicillin-clavulanate (AUGMENTIN) 875-125 MG per tablet Take 1 tablet by mouth every 12 (twelve) hours. 7 days 04/02/15   Evon Slack, PA-C  oxyCODONE-acetaminophen (ROXICET) 5-325 MG per tablet Take 1-2 tablets by mouth every 6 (six) hours as needed for severe pain. 04/02/15   Evon Slack, PA-C   BP 150/70 mmHg  Pulse 94  Temp(Src) 98.3 F (36.8 C) (Oral)  Resp 18  Ht  (1.88 m)  Wt 220 lb (99.791 kg)  BMI 28.23 kg/m2  SpO2 98% Physical Exam  Constitutional: He is oriented to person, place, and time. He appears well-developed and well-nourished.  HENT:  Head: Normocephalic and atraumatic.  Eyes: Conjunctivae and EOM are normal.  Neck: Normal range of motion. Neck supple.  Cardiovascular: Normal rate and regular rhythm.   Pulmonary/Chest: Effort normal. No respiratory distress.  Musculoskeletal:  Plantar aspect of the left foot shows there is a small 1 x 1 cm puncture wound with no purulent drainage at the ball of the left foot, base of 3rd metatarsal. There is mild erythema on the plantar and dorsal aspect the left foot. Erythema does not track above the ankle. Sensation is intact throughout the left lower extremity. No evidence of foreign body noted.  Neurological: He is alert and oriented to person, place, and time. He has normal reflexes.    ED Course  Procedures (including critical care time) Labs  Review Labs Reviewed - No data to display  Imaging Review Dg Foot Complete Left  04/02/2015   CLINICAL DATA:  Penetrating injury. Patient stepped on a nail yesterday. Foot swelling.  EXAM: LEFT FOOT - COMPLETE 3+ VIEW  COMPARISON:  None.  FINDINGS: Anatomic alignment. No radiopaque foreign body. Mild forefoot soft tissue swelling present. In the plantar  soft tissues over the toes, there appears to be some gas in the soft tissues however this could be artifactual due to soft tissue overlap. There is no gas tracking proximally in the foot.  IMPRESSION: 1. No radiopaque foreign body or fracture. 2. Probable gas in the plantar soft tissues of the forefoot.   Electronically Signed   By: Andreas NewportGeoffrey  Lamke M.D.   On: 04/02/2015 18:35     EKG Interpretation None      MDM   Final diagnoses:  Cellulitis of left foot  Puncture wound of left foot, initial encounter    44 year old male with puncture wound to the left foot. Tetanus is up-to-date. He has developed a small cellulitis. There is no evidence of foreign body. Patient is given intramuscular clindamycin in the emergency department. He will be started on oral Augmentin. Will follow-up in 2-3 days for recheck. Over the next 2-3 days, patient needs to soak left foot into half warm water, half hydrogen peroxide. Keep lower extremity elevated  Evon Slackhomas C Gaines, PA-C 04/02/15 1923  Gayla DossEryka A Gayle, MD 04/03/15 [phone]

## 2015-04-02 NOTE — Discharge Instructions (Signed)

## 2015-04-02 NOTE — ED Notes (Signed)
Pt states that while working yesterday he stepped on a screw that went through his shoe, pt states that he had to back screw out with a drill. Pt states that he worked today and states that the pain has increased and he has swelling to the foot as well as a hx of diabetes, he reports that his fsbs last night was 114 but hasn't checked it today

## 2015-04-04 DIAGNOSIS — B9561 Methicillin susceptible Staphylococcus aureus infection as the cause of diseases classified elsewhere: Secondary | ICD-10-CM | POA: Diagnosis present

## 2015-04-04 DIAGNOSIS — N184 Chronic kidney disease, stage 4 (severe): Secondary | ICD-10-CM | POA: Diagnosis present

## 2015-04-04 DIAGNOSIS — K2980 Duodenitis without bleeding: Secondary | ICD-10-CM | POA: Diagnosis present

## 2015-04-04 DIAGNOSIS — E1165 Type 2 diabetes mellitus with hyperglycemia: Secondary | ICD-10-CM | POA: Diagnosis present

## 2015-04-04 DIAGNOSIS — Z882 Allergy status to sulfonamides status: Secondary | ICD-10-CM

## 2015-04-04 DIAGNOSIS — K920 Hematemesis: Secondary | ICD-10-CM | POA: Diagnosis not present

## 2015-04-04 DIAGNOSIS — Z6828 Body mass index (BMI) 28.0-28.9, adult: Secondary | ICD-10-CM

## 2015-04-04 DIAGNOSIS — D638 Anemia in other chronic diseases classified elsewhere: Secondary | ICD-10-CM | POA: Diagnosis present

## 2015-04-04 DIAGNOSIS — Z794 Long term (current) use of insulin: Secondary | ICD-10-CM

## 2015-04-04 DIAGNOSIS — L03116 Cellulitis of left lower limb: Principal | ICD-10-CM | POA: Diagnosis present

## 2015-04-04 DIAGNOSIS — I129 Hypertensive chronic kidney disease with stage 1 through stage 4 chronic kidney disease, or unspecified chronic kidney disease: Secondary | ICD-10-CM | POA: Diagnosis present

## 2015-04-04 DIAGNOSIS — N179 Acute kidney failure, unspecified: Secondary | ICD-10-CM | POA: Diagnosis present

## 2015-04-04 DIAGNOSIS — B961 Klebsiella pneumoniae [K. pneumoniae] as the cause of diseases classified elsewhere: Secondary | ICD-10-CM | POA: Diagnosis present

## 2015-04-04 DIAGNOSIS — K210 Gastro-esophageal reflux disease with esophagitis: Secondary | ICD-10-CM | POA: Diagnosis present

## 2015-04-04 DIAGNOSIS — K209 Esophagitis, unspecified: Secondary | ICD-10-CM | POA: Diagnosis present

## 2015-04-04 DIAGNOSIS — E663 Overweight: Secondary | ICD-10-CM | POA: Diagnosis present

## 2015-04-04 DIAGNOSIS — K2970 Gastritis, unspecified, without bleeding: Secondary | ICD-10-CM | POA: Diagnosis present

## 2015-04-04 DIAGNOSIS — E871 Hypo-osmolality and hyponatremia: Secondary | ICD-10-CM | POA: Diagnosis present

## 2015-04-04 DIAGNOSIS — Z79891 Long term (current) use of opiate analgesic: Secondary | ICD-10-CM

## 2015-04-04 DIAGNOSIS — S91342A Puncture wound with foreign body, left foot, initial encounter: Secondary | ICD-10-CM | POA: Diagnosis present

## 2015-04-04 DIAGNOSIS — L02612 Cutaneous abscess of left foot: Secondary | ICD-10-CM | POA: Diagnosis present

## 2015-04-04 NOTE — ED Notes (Signed)
Reports seen here on Friday because he stepped on a screw at work and told to return if it became worse.  Pt reports area red, swollen and that he has been running fever.

## 2015-04-05 ENCOUNTER — Inpatient Hospital Stay
Admission: EM | Admit: 2015-04-05 | Discharge: 2015-04-08 | DRG: 580 | Disposition: A | Discharge status: Home / Self Care | Payer: Worker's Compensation | Attending: Internal Medicine | Admitting: Internal Medicine

## 2015-04-05 ENCOUNTER — Emergency Department: Payer: Worker's Compensation

## 2015-04-05 ENCOUNTER — Encounter: Payer: Self-pay | Admitting: *Deleted

## 2015-04-05 DIAGNOSIS — K209 Esophagitis, unspecified without bleeding: Secondary | ICD-10-CM | POA: Insufficient documentation

## 2015-04-05 DIAGNOSIS — K210 Gastro-esophageal reflux disease with esophagitis: Secondary | ICD-10-CM | POA: Diagnosis present

## 2015-04-05 DIAGNOSIS — L03116 Cellulitis of left lower limb: Secondary | ICD-10-CM

## 2015-04-05 DIAGNOSIS — R110 Nausea: Secondary | ICD-10-CM | POA: Diagnosis not present

## 2015-04-05 DIAGNOSIS — N184 Chronic kidney disease, stage 4 (severe): Secondary | ICD-10-CM | POA: Diagnosis present

## 2015-04-05 DIAGNOSIS — Z79891 Long term (current) use of opiate analgesic: Secondary | ICD-10-CM | POA: Diagnosis not present

## 2015-04-05 DIAGNOSIS — I129 Hypertensive chronic kidney disease with stage 1 through stage 4 chronic kidney disease, or unspecified chronic kidney disease: Secondary | ICD-10-CM | POA: Diagnosis present

## 2015-04-05 DIAGNOSIS — B9561 Methicillin susceptible Staphylococcus aureus infection as the cause of diseases classified elsewhere: Secondary | ICD-10-CM | POA: Diagnosis present

## 2015-04-05 DIAGNOSIS — L02612 Cutaneous abscess of left foot: Secondary | ICD-10-CM | POA: Diagnosis present

## 2015-04-05 DIAGNOSIS — Z6828 Body mass index (BMI) 28.0-28.9, adult: Secondary | ICD-10-CM | POA: Diagnosis not present

## 2015-04-05 DIAGNOSIS — D638 Anemia in other chronic diseases classified elsewhere: Secondary | ICD-10-CM | POA: Diagnosis present

## 2015-04-05 DIAGNOSIS — K2970 Gastritis, unspecified, without bleeding: Secondary | ICD-10-CM | POA: Diagnosis present

## 2015-04-05 DIAGNOSIS — E663 Overweight: Secondary | ICD-10-CM | POA: Diagnosis present

## 2015-04-05 DIAGNOSIS — B961 Klebsiella pneumoniae [K. pneumoniae] as the cause of diseases classified elsewhere: Secondary | ICD-10-CM | POA: Diagnosis present

## 2015-04-05 DIAGNOSIS — E871 Hypo-osmolality and hyponatremia: Secondary | ICD-10-CM | POA: Diagnosis present

## 2015-04-05 DIAGNOSIS — K2980 Duodenitis without bleeding: Secondary | ICD-10-CM | POA: Insufficient documentation

## 2015-04-05 DIAGNOSIS — K920 Hematemesis: Secondary | ICD-10-CM | POA: Diagnosis not present

## 2015-04-05 DIAGNOSIS — N179 Acute kidney failure, unspecified: Secondary | ICD-10-CM | POA: Diagnosis present

## 2015-04-05 DIAGNOSIS — Z794 Long term (current) use of insulin: Secondary | ICD-10-CM | POA: Diagnosis not present

## 2015-04-05 DIAGNOSIS — E1165 Type 2 diabetes mellitus with hyperglycemia: Secondary | ICD-10-CM | POA: Diagnosis present

## 2015-04-05 DIAGNOSIS — Z882 Allergy status to sulfonamides status: Secondary | ICD-10-CM | POA: Diagnosis not present

## 2015-04-05 DIAGNOSIS — S91342A Puncture wound with foreign body, left foot, initial encounter: Secondary | ICD-10-CM | POA: Diagnosis present

## 2015-04-05 DIAGNOSIS — L0390 Cellulitis, unspecified: Secondary | ICD-10-CM | POA: Diagnosis present

## 2015-04-05 HISTORY — DX: Type 2 diabetes mellitus without complications: E11.9

## 2015-04-05 HISTORY — DX: Gastro-esophageal reflux disease without esophagitis: K21.9

## 2015-04-05 HISTORY — DX: Cutaneous abscess of unspecified foot: L02.619

## 2015-04-05 HISTORY — DX: Cellulitis of unspecified part of limb: L03.119

## 2015-04-05 LAB — BASIC METABOLIC PANEL
Anion gap: 12 (ref 5–15)
BUN: 91 mg/dL — ABNORMAL HIGH (ref 6–20)
CALCIUM: 9.1 mg/dL (ref 8.9–10.3)
CO2: 23 mmol/L (ref 22–32)
CREATININE: 2.75 mg/dL — AB (ref 0.61–1.24)
Chloride: 99 mmol/L — ABNORMAL LOW (ref 101–111)
GFR calc Af Amer: 30 mL/min — ABNORMAL LOW (ref >60)
GFR calc non Af Amer: 26 mL/min — ABNORMAL LOW (ref >60)
GLUCOSE: 318 mg/dL — AB (ref 65–99)
Potassium: 4 mmol/L (ref 3.5–5.1)
Sodium: 134 mmol/L — ABNORMAL LOW (ref 135–145)

## 2015-04-05 LAB — HEMOGLOBIN A1C: Hgb A1c MFr Bld: 8.3 % — ABNORMAL HIGH (ref 4.0–6.0)

## 2015-04-05 LAB — CBC WITH DIFFERENTIAL/PLATELET
BASOS ABS: 0 10*3/uL (ref 0–0.1)
Basophils Relative: 0 %
EOS PCT: 2 %
Eosinophils Absolute: 0.1 10*3/uL (ref 0–0.7)
HEMATOCRIT: 27 % — AB (ref 40.0–52.0)
Hemoglobin: 8.7 g/dL — ABNORMAL LOW (ref 13.0–18.0)
Lymphocytes Relative: 17 %
Lymphs Abs: 1.7 10*3/uL (ref 1.0–3.6)
MCH: 27.5 pg (ref 26.0–34.0)
MCHC: 32.2 g/dL (ref 32.0–36.0)
MCV: 85.5 fL (ref 80.0–100.0)
Monocytes Absolute: 1 10*3/uL (ref 0.2–1.0)
Monocytes Relative: 10 %
NEUTROS ABS: 7.2 10*3/uL — AB (ref 1.4–6.5)
Neutrophils Relative %: 71 %
Platelets: 184 10*3/uL (ref 150–440)
RBC: 3.16 MIL/uL — ABNORMAL LOW (ref 4.40–5.90)
RDW: 12 % (ref 11.5–14.5)
WBC: 10.1 10*3/uL (ref 3.8–10.6)

## 2015-04-05 LAB — GLUCOSE, CAPILLARY
Glucose-Capillary: 299 mg/dL — ABNORMAL HIGH (ref 65–99)
Glucose-Capillary: 202 mg/dL — ABNORMAL HIGH (ref 65–99)
Glucose-Capillary: 174 mg/dL — ABNORMAL HIGH (ref 65–99)
Glucose-Capillary: 118 mg/dL — ABNORMAL HIGH (ref 65–99)

## 2015-04-05 LAB — TSH: TSH: 0.043 u[IU]/mL — ABNORMAL LOW (ref 0.350–4.500)

## 2015-04-05 MED ORDER — MORPHINE SULFATE 4 MG/ML IJ SOLN
INTRAMUSCULAR | Status: AC
  Filled 2015-04-05: qty 1

## 2015-04-05 MED ORDER — INSULIN GLARGINE 100 UNIT/ML ~~LOC~~ SOLN
16.0000 [IU] | Freq: Every day | SUBCUTANEOUS | Status: DC
  Administered 2015-04-05 – 2015-04-08 (×3): 16 [IU] via SUBCUTANEOUS
  Filled 2015-04-05 (×6): qty 0.16

## 2015-04-05 MED ORDER — DOCUSATE SODIUM 100 MG PO CAPS
100.0000 mg | ORAL_CAPSULE | Freq: Two times a day (BID) | ORAL | Status: DC
  Administered 2015-04-05 – 2015-04-07 (×5): 100 mg via ORAL
  Filled 2015-04-05 (×7): qty 1

## 2015-04-05 MED ORDER — CIPROFLOXACIN IN D5W 400 MG/200ML IV SOLN
INTRAVENOUS | Status: AC
  Administered 2015-04-05: 400 mg via INTRAVENOUS
  Filled 2015-04-05: qty 200

## 2015-04-05 MED ORDER — PIPERACILLIN-TAZOBACTAM 3.375 G IVPB
3.3750 g | Freq: Three times a day (TID) | INTRAVENOUS | Status: DC
  Administered 2015-04-05 – 2015-04-08 (×10): 3.375 g via INTRAVENOUS
  Filled 2015-04-05 (×13): qty 50

## 2015-04-05 MED ORDER — ACETAMINOPHEN 500 MG PO TABS
1000.0000 mg | ORAL_TABLET | Freq: Once | ORAL | Status: AC
  Administered 2015-04-05: 1000 mg via ORAL

## 2015-04-05 MED ORDER — SODIUM CHLORIDE 0.9 % IV BOLUS (SEPSIS)
1000.0000 mL | Freq: Once | INTRAVENOUS | Status: AC
  Administered 2015-04-05: 1000 mL via INTRAVENOUS

## 2015-04-05 MED ORDER — ONDANSETRON HCL 4 MG/2ML IJ SOLN: 4.0000 mg | Freq: Once | INTRAMUSCULAR | Status: DC

## 2015-04-05 MED ORDER — CIPROFLOXACIN IN D5W 400 MG/200ML IV SOLN
400.0000 mg | Freq: Two times a day (BID) | INTRAVENOUS | Status: DC
  Administered 2015-04-05: 400 mg via INTRAVENOUS
  Filled 2015-04-05 (×2): qty 200

## 2015-04-05 MED ORDER — INSULIN ASPART 100 UNIT/ML ~~LOC~~ SOLN
0.0000 [IU] | Freq: Three times a day (TID) | SUBCUTANEOUS | Status: DC
  Administered 2015-04-05: 5 [IU] via SUBCUTANEOUS
  Administered 2015-04-05: 3 [IU] via SUBCUTANEOUS
  Administered 2015-04-05: 8 [IU] via SUBCUTANEOUS
  Administered 2015-04-06 (×2): 3 [IU] via SUBCUTANEOUS
  Administered 2015-04-06: 5 [IU] via SUBCUTANEOUS
  Administered 2015-04-07: 2 [IU] via SUBCUTANEOUS
  Administered 2015-04-07: 5 [IU] via SUBCUTANEOUS
  Administered 2015-04-07: 3 [IU] via SUBCUTANEOUS
  Administered 2015-04-08: 2 [IU] via SUBCUTANEOUS
  Filled 2015-04-05: qty 5
  Filled 2015-04-05: qty 8
  Filled 2015-04-05 (×2): qty 3
  Filled 2015-04-05: qty 5
  Filled 2015-04-05: qty 3
  Filled 2015-04-05: qty 2
  Filled 2015-04-05: qty 5
  Filled 2015-04-05 (×2): qty 3
  Filled 2015-04-05: qty 2

## 2015-04-05 MED ORDER — VANCOMYCIN HCL 10 G IV SOLR
1250.0000 mg | INTRAVENOUS | Status: DC
  Administered 2015-04-05 – 2015-04-07 (×3): 1250 mg via INTRAVENOUS
  Filled 2015-04-05 (×5): qty 1250

## 2015-04-05 MED ORDER — ACETAMINOPHEN 500 MG PO TABS
ORAL_TABLET | ORAL | Status: AC
  Administered 2015-04-05: 1000 mg via ORAL
  Filled 2015-04-05: qty 2

## 2015-04-05 MED ORDER — INSULIN ASPART 100 UNIT/ML ~~LOC~~ SOLN: 0.0000 [IU] | Freq: Every day | SUBCUTANEOUS | Status: DC

## 2015-04-05 MED ORDER — VANCOMYCIN HCL IN DEXTROSE 1-5 GM/200ML-% IV SOLN
INTRAVENOUS | Status: AC
  Filled 2015-04-05: qty 200

## 2015-04-05 MED ORDER — HEPARIN SODIUM (PORCINE) 5000 UNIT/ML IJ SOLN
5000.0000 [IU] | Freq: Three times a day (TID) | INTRAMUSCULAR | Status: DC
  Administered 2015-04-05 – 2015-04-06 (×4): 5000 [IU] via SUBCUTANEOUS
  Filled 2015-04-05 (×5): qty 1

## 2015-04-05 MED ORDER — VANCOMYCIN HCL IN DEXTROSE 1-5 GM/200ML-% IV SOLN: 1000.0000 mg | Freq: Once | INTRAVENOUS | Status: DC

## 2015-04-05 MED ORDER — ACETAMINOPHEN 650 MG RE SUPP: 650.0000 mg | Freq: Four times a day (QID) | RECTAL | Status: DC | PRN

## 2015-04-05 MED ORDER — PNEUMOCOCCAL VAC POLYVALENT 25 MCG/0.5ML IJ INJ
0.5000 mL | INJECTION | INTRAMUSCULAR | Status: AC
  Administered 2015-04-06: 0.5 mL via INTRAMUSCULAR
  Filled 2015-04-05: qty 0.5

## 2015-04-05 MED ORDER — ACETAMINOPHEN 325 MG PO TABS
650.0000 mg | ORAL_TABLET | Freq: Four times a day (QID) | ORAL | Status: DC | PRN
  Administered 2015-04-06 (×2): 650 mg via ORAL
  Filled 2015-04-05 (×2): qty 2

## 2015-04-05 MED ORDER — SODIUM CHLORIDE 0.9 % IV SOLN
INTRAVENOUS | Status: DC
  Administered 2015-04-05 – 2015-04-07 (×3): via INTRAVENOUS

## 2015-04-05 MED ORDER — MORPHINE SULFATE 2 MG/ML IJ SOLN
2.0000 mg | INTRAMUSCULAR | Status: DC | PRN
  Administered 2015-04-05: 2 mg via INTRAVENOUS
  Filled 2015-04-05: qty 1

## 2015-04-05 MED ORDER — ONDANSETRON HCL 4 MG/2ML IJ SOLN
4.0000 mg | Freq: Four times a day (QID) | INTRAMUSCULAR | Status: DC | PRN
  Administered 2015-04-05 – 2015-04-07 (×5): 4 mg via INTRAVENOUS
  Filled 2015-04-05 (×3): qty 2

## 2015-04-05 MED ORDER — CLONIDINE HCL 0.1 MG PO TABS
0.1000 mg | ORAL_TABLET | Freq: Two times a day (BID) | ORAL | Status: DC
  Administered 2015-04-05 – 2015-04-08 (×7): 0.1 mg via ORAL
  Filled 2015-04-05 (×7): qty 1

## 2015-04-05 MED ORDER — ONDANSETRON HCL 4 MG/2ML IJ SOLN
INTRAMUSCULAR | Status: AC
  Filled 2015-04-05: qty 2

## 2015-04-05 MED ORDER — VANCOMYCIN HCL IN DEXTROSE 1-5 GM/200ML-% IV SOLN
1000.0000 mg | Freq: Once | INTRAVENOUS | Status: AC
  Administered 2015-04-05: 1000 mg via INTRAVENOUS
  Filled 2015-04-05: qty 200

## 2015-04-05 MED ORDER — MORPHINE SULFATE 4 MG/ML IJ SOLN: 4.0000 mg | Freq: Once | INTRAMUSCULAR | Status: DC

## 2015-04-05 MED ORDER — ONDANSETRON HCL 4 MG PO TABS: 4.0000 mg | ORAL_TABLET | Freq: Four times a day (QID) | ORAL | Status: DC | PRN

## 2015-04-05 NOTE — Progress Notes (Signed)
Dr.Chen paged, callback received, informed of patient's blood pressure of 166/58. No new orders received. Will continue to monitor the patient.

## 2015-04-05 NOTE — Progress Notes (Signed)
Admitted today with left foot cellulitis. LLE elevated on pillow. On IV abx- vancomycin and Zosyn (see MAR), IV fluids NS infusing at 75 ml/hr. Going for surgery in am- for incision and drainage of left foot. Left foot culture sent this afternoon- pending results.

## 2015-04-05 NOTE — Progress Notes (Addendum)
ANTIBIOTIC CONSULT NOTE - INITIAL  Pharmacy Consult for Vancomycin and Zosyn Indication: Cellulitis  Allergies  Allergen Reactions  . Sulfur Other (See Comments)    Renal failure    Patient Measurements: Height: 6\' 2"  (188 cm) Weight: 220 lb (99.791 kg) IBW/kg (Calculated) : 82.2  Vital Signs: Temp: 98.2 F (36.8 C) (07/04 0752) Temp Source: Oral (07/04 0752) BP: 157/54 mmHg (07/04 0752) Pulse Rate: 72 (07/04 0752) Intake/Output from previous day: 07/03 0701 - 07/04 0700 In: -  Out: 300 [Urine:300]  Labs:  Recent Labs  04/05/15 0440  WBC 10.1  HGB 8.7*  PLT 184  CREATININE 2.75*   Estimated Creatinine Clearance: 42.3 mL/min (by C-G formula based on Cr of 2.75). No results for input(s): VANCOTROUGH, VANCOPEAK, VANCORANDOM, GENTTROUGH, GENTPEAK, GENTRANDOM, TOBRATROUGH, TOBRAPEAK, TOBRARND, AMIKACINPEAK, AMIKACINTROU, AMIKACIN in the last 72 hours.   Microbiology: Recent Results (from the past 720 hour(s))  Culture, blood (routine x 2)     Status: None (Preliminary result)   Collection Time: 04/05/15  4:38 AM  Result Value Ref Range Status   Specimen Description BLOOD LEFT FATTY CASTS UPPER  Final   Special Requests BOTTLES DRAWN AEROBIC AND ANAEROBIC 7CC  Final   Culture NO GROWTH < 12 HOURS  Final   Report Status PENDING  Incomplete  Culture, blood (routine x 2)     Status: None (Preliminary result)   Collection Time: 04/05/15  4:41 AM  Result Value Ref Range Status   Specimen Description BLOOD LEFT FATTY CASTS LOWER  Final   Special Requests BOTTLES DRAWN AEROBIC AND ANAEROBIC 7CC  Final   Culture NO GROWTH < 12 HOURS  Final   Report Status PENDING  Incomplete    Medical History: Past Medical History  Diagnosis Date  . Diabetes mellitus without complication   . Hypertension   . Chronic kidney disease   . GERD (gastroesophageal reflux disease)    Goal of Therapy:  Vancomycin trough ~ 15  Plan:  Calculations:  CrCl ~ 39.02, Ke = 0.0324, T 1/2 =  21.4hr, Vd = 69.86L.  Pt given Vancomycin 1gm at 08:21 this AM.  Ordered Vancomycin 1250 mg IV q24hr starting at 14:30 today (stacked dose) and first trough on 7/6 at 14:00 prior to 4th dose.  AM labs already ordered for tomorrow by MD. Pharmacy will recheck SCr tomorrow AM and adjust Vanc dose if warranted.  Zosyn:  Based on CrCl, ordered Zosyn 3.375g IV extended infusion q8hr.  Pharmacy will continue to follow.  Saraya Tirey,Natha S 04/05/2015,9:52 AM

## 2015-04-05 NOTE — Progress Notes (Signed)
Spoke with Dr. Anne HahnWillis. Pt to have surgery tomorrow and has heparin ordered. md order to hold morning dose.

## 2015-04-05 NOTE — H&P (Signed)
Juan Roberson. is an 44 y.o. male.   Chief Complaint: Fever and chills HPI: The patient presents emergency department complaining of swelling and pain in his left foot along with fever and chills. He's had multiple episodes of nonbloody nonbilious emesis for the last 2 days. He was seen in the emergency department at that time for a puncture wound on his left foot he sustained after stepping on a screw that went so deeply into his foot that he needed the drill to remove the screw. The screw punctured his shoe. He was prescribed Augmentin and sent home. Upon arrival today patient states that he had been in so much pain he was barely able to walk. Due to signs of groin cellulitis the emergency department called for admission.  Past Medical History  Diagnosis Date  . Diabetes mellitus without complication   . Hypertension     Past Surgical History  Procedure Laterality Date  . No past surgeries      Family History  Problem Relation Age of Onset  . Coronary artery disease Father   . Pancreatic cancer Mother   . Breast cancer Sister    Social History:  reports that he has never smoked. He does not have any smokeless tobacco history on file. He reports that he does not drink alcohol or use illicit drugs.  Allergies:  Allergies  Allergen Reactions  . Sulfur Other (See Comments)    Renal failure    Prior to Admission medications   Medication Sig Start Date End Date Taking? Authorizing Provider  cloNIDine (CATAPRES) 0.1 MG tablet Take 0.1 mg by mouth 2 (two) times daily.   Yes Historical Provider, MD  furosemide (LASIX) 40 MG tablet Take 40 mg by mouth daily.   Yes Historical Provider, MD  amoxicillin-clavulanate (AUGMENTIN) 875-125 MG per tablet Take 1 tablet by mouth every 12 (twelve) hours. 7 days 04/02/15   Duanne Guess, PA-C  oxyCODONE-acetaminophen (ROXICET) 5-325 MG per tablet Take 1-2 tablets by mouth every 6 (six) hours as needed for severe pain. 04/02/15   Duanne Guess,  PA-C     Results for orders placed or performed during the hospital encounter of 04/05/15 (from the past 48 hour(s))  CBC with Differential     Status: Abnormal   Collection Time: 04/05/15  4:40 AM  Result Value Ref Range   WBC 10.1 3.8 - 10.6 K/uL   RBC 3.16 (L) 4.40 - 5.90 MIL/uL   Hemoglobin 8.7 (L) 13.0 - 18.0 g/dL   HCT 27.0 (L) 40.0 - 52.0 %   MCV 85.5 80.0 - 100.0 fL   MCH 27.5 26.0 - 34.0 pg   MCHC 32.2 32.0 - 36.0 g/dL   RDW 12.0 11.5 - 14.5 %   Platelets 184 150 - 440 K/uL   Neutrophils Relative % 71 %   Neutro Abs 7.2 (H) 1.4 - 6.5 K/uL   Lymphocytes Relative 17 %   Lymphs Abs 1.7 1.0 - 3.6 K/uL   Monocytes Relative 10 %   Monocytes Absolute 1.0 0.2 - 1.0 K/uL   Eosinophils Relative 2 %   Eosinophils Absolute 0.1 0 - 0.7 K/uL   Basophils Relative 0 %   Basophils Absolute 0.0 0 - 0.1 K/uL  Basic metabolic panel     Status: Abnormal   Collection Time: 04/05/15  4:40 AM  Result Value Ref Range   Sodium 134 (L) 135 - 145 mmol/L   Potassium 4.0 3.5 - 5.1 mmol/L   Chloride 99 (  L) 101 - 111 mmol/L   CO2 23 22 - 32 mmol/L   Glucose, Bld 318 (H) 65 - 99 mg/dL   BUN 91 (H) 6 - 20 mg/dL   Creatinine, Ser 2.75 (H) 0.61 - 1.24 mg/dL   Calcium 9.1 8.9 - 10.3 mg/dL   GFR calc non Af Amer 26 (L) >60 mL/min   GFR calc Af Amer 30 (L) >60 mL/min    Comment: (NOTE) The eGFR has been calculated using the CKD EPI equation. This calculation has not been validated in all clinical situations. eGFR's persistently <60 mL/min signify possible Chronic Kidney Disease.    Anion gap 12 5 - 15   Dg Foot Complete Left  04/05/2015   CLINICAL DATA:  Worsening redness and swelling to the left foot after a screw was stuck in the foot.  EXAM: LEFT FOOT - COMPLETE 3+ VIEW  COMPARISON:  04/02/2015  FINDINGS: Forefoot soft tissue swelling is grossly stable. Previously seen soft tissue gas is no longer visible. There is no evidence of opaque foreign body, fracture, or osseous infection.  IMPRESSION:  Forefoot soft tissue swelling without foreign body or soft tissue gas. No acute osseous findings.   Electronically Signed   By: Monte Fantasia M.D.   On: 04/05/2015 04:59    Review of Systems  Constitutional: Positive for fever and chills.  HENT: Negative for sore throat and tinnitus.   Eyes: Negative for blurred vision and redness.  Respiratory: Negative for cough and shortness of breath.   Cardiovascular: Negative for chest pain, palpitations, orthopnea and PND.  Gastrointestinal: Positive for nausea and vomiting. Negative for abdominal pain and diarrhea.  Genitourinary: Negative for dysuria, urgency and frequency.  Musculoskeletal: Negative for myalgias and joint pain.  Skin: Negative for rash.       No lesions  Neurological: Negative for speech change, focal weakness and weakness.  Endo/Heme/Allergies: Does not bruise/bleed easily.       No temperature intolerance  Psychiatric/Behavioral: Negative for depression and suicidal ideas.    Blood pressure 124/62, pulse 88, temperature 99 F (37.2 C), temperature source Oral, resp. rate 16, height 6' 2" (1.88 m), weight 99.791 kg (220 lb), SpO2 93 %. Physical Exam  Nursing note and vitals reviewed. Constitutional: He is oriented to person, place, and time. He appears well-developed and well-nourished. No distress.  HENT:  Head: Normocephalic and atraumatic.  Mouth/Throat: Oropharynx is clear and moist.  Eyes: Conjunctivae and EOM are normal. Pupils are equal, round, and reactive to light. No scleral icterus.  Neck: Normal range of motion. Neck supple. No JVD present. No tracheal deviation present. No thyromegaly present.  Cardiovascular: Regular rhythm, normal heart sounds and intact distal pulses.  Tachycardia present.  Exam reveals no gallop and no friction rub.   No murmur heard. Respiratory: Effort normal and breath sounds normal.  GI: Soft. Bowel sounds are normal. He exhibits no distension. There is no tenderness.    Genitourinary:  Deferred  Musculoskeletal: Normal range of motion. He exhibits edema (trace).  Lymphadenopathy:    He has no cervical adenopathy.  Neurological: He is alert and oriented to person, place, and time. No cranial nerve deficit.  Skin: Skin is warm and dry. No rash noted. There is erythema.     0.5 centimeter diameter puncture wound all of left foot  Psychiatric: He has a normal mood and affect. His behavior is normal. Judgment and thought content normal.     Assessment/Plan This is a 44 year old male admitted for cellulitis of  the left foot. 1. Cellulitis: The patient does not meet criteria for sepsis at this time however he has symptoms of sepsis area we have obtained blood cultures and started the patient on anti-pseudomonal coverage due to the shoe puncture as well as vancomycin for MRSA coverage. There is no indication of osteomyelitis on x-ray. 2. Diabetes mellitus type 2: I placed the patient on sliding scale insulin while hospitalized 3. Overweight: BMI is 28.3; encourage healthy diet and exercise 4. DVT prophylaxis: Heparin 5. GI prophylaxis: None The patient is a full code. Time spent on admission orders and patient care approximately 35 minutes  Harrie Foreman 04/05/2015, 6:39 AM

## 2015-04-05 NOTE — Plan of Care (Signed)
Problem: Consults Goal: Cellulitis Patient Education See Patient Education Module for education specifics.  Outcome: Progressing Cellulitis handout given to patient

## 2015-04-05 NOTE — ED Provider Notes (Signed)
[facility] Emergency Department Provider Note  ____________________________________________  Time seen: Approximately 2:40 AM  I have reviewed the triage vital signs and the nursing notes.   HISTORY  Chief Complaint Foot Pain    HPI Juan BlossomWaltzie L Scovill Jr. is a 44 y.o. male who returns to the ED for increasing redness and swelling to left foot. Patient was seen on July 1 s/p stepping on screw with left foot. Patient has been on Augmentin since that time. Complains of low-grade fever, chills, intermittent nausea/vomiting, increased redness, swelling and streaking from the left foot to left leg. Patient is a diabetic and notes blood sugar has been in the 400s. Baseline blood sugarsusually in the 150 range. Complains of 6/10 left foot pain. Nothing makes the pain or redness better. Standing makes the pain worse. Patient states he has been compliant with antibiotics and foot soaks as review of sleep instructed.   Past medical history Diabetes (insulin-dependent)   Patient Active Problem List   Diagnosis Date Noted  . Cellulitis 04/05/2015    Past Surgical History  Procedure Laterality Date  . No past surgeries      No current outpatient prescriptions on file.  Allergies Sulfur  Family History  Problem Relation Age of Onset  . Coronary artery disease Father   . Pancreatic cancer Mother   . Breast cancer Sister     Social History History  Substance Use Topics  . Smoking status: Never Smoker   . Smokeless tobacco: Not on file  . Alcohol Use: No  No recent alcohol use  Review of Systems Constitutional: Positive for fever/chills Eyes: No visual changes. ENT: No sore throat. Cardiovascular: Denies chest pain. Respiratory: Denies shortness of breath. Gastrointestinal: No abdominal pain.  Positive for intermittent nausea and vomiting.  No diarrhea.  No constipation. Genitourinary: Negative for dysuria. Musculoskeletal: Positive for left foot pain,  swelling and redness. Negative for back pain. Skin: Negative for rash. Neurological: Negative for headaches, focal weakness or numbness.  10-point ROS otherwise negative.  ____________________________________________   PHYSICAL EXAM:  VITAL SIGNS: ED Triage Vitals  Enc Vitals Group     BP 04/04/15 2227 153/63 mmHg     Pulse Rate 04/04/15 2227 86     Resp 04/04/15 2227 20     Temp 04/04/15 2227 100.2 F (37.9 C)     Temp Source 04/04/15 2227 Oral     SpO2 04/04/15 2227 97 %     Weight 04/04/15 2227 220 lb (99.791 kg)     Height 04/04/15 2227 6\' 2"  (1.88 m)     Head Cir --      Peak Flow --      Pain Score 04/04/15 2228 10     Pain Loc --      Pain Edu? --      Excl. in GC? --     Constitutional: Alert and oriented. Well appearing and in no acute distress. Eyes: Conjunctivae are normal. PERRL. EOMI. Head: Atraumatic. Nose: No congestion/rhinnorhea. Mouth/Throat: Mucous membranes are moist.  Oropharynx non-erythematous. Neck: No stridor.   Cardiovascular: Normal rate, regular rhythm. Grossly normal heart sounds.  Good peripheral circulation. Respiratory: Normal respiratory effort.  No retractions. Lungs CTAB. Gastrointestinal: Soft and nontender. No distention. No abdominal bruits. No CVA tenderness. Musculoskeletal: Plantar aspect of left foot with small puncture wound without purulent drainage. There is moderate erythema and swelling to the foot with streaking up the calf. Sensation is intact. No palpable foreign body noted. Neurologic:  Normal speech  and language. No gross focal neurologic deficits are appreciated. Speech is normal.  Skin:  Skin is warm, dry and intact. No rash noted. Psychiatric: Mood and affect are normal. Speech and behavior are normal.  ____________________________________________   LABS (all labs ordered are listed, but only abnormal results are displayed)  Labs Reviewed  CBC WITH DIFFERENTIAL/PLATELET - Abnormal; Notable for the following:     RBC 3.16 (*)    Hemoglobin 8.7 (*)    HCT 27.0 (*)    Neutro Abs 7.2 (*)    All other components within normal limits  BASIC METABOLIC PANEL - Abnormal; Notable for the following:    Sodium 134 (*)    Chloride 99 (*)    Glucose, Bld 318 (*)    BUN 91 (*)    Creatinine, Ser 2.75 (*)    GFR calc non Af Amer 26 (*)    GFR calc Af Amer 30 (*)    All other components within normal limits  CULTURE, BLOOD (ROUTINE X 2)  CULTURE, BLOOD (ROUTINE X 2)  TSH  HEMOGLOBIN A1C   ____________________________________________  EKG  None ____________________________________________  RADIOLOGY  Left foot complete (viewed by me, interpreted by Dr. Grace Isaac): Forefoot soft tissue swelling without foreign body or soft tissue gas. No acute osseous findings. ____________________________________________   PROCEDURES  Procedure(s) performed: None  Critical Care performed: No  ____________________________________________   INITIAL IMPRESSION / ASSESSMENT AND PLAN / ED COURSE  Pertinent labs & imaging results that were available during my care of the patient were reviewed by me and considered in my medical decision making (see chart for details).  44 year old male, IDDM, returns with worsening cellulitis to left foot s/p puncture wound. Will obtain blood cultures, screening labs, obtain x-ray to evaluate osteomyelitis, initiate IV antibiotics to cover pseudomonas as well as MRSA infections. Anticipate hospital admission for failed outpatient antibiotics.  ----------------------------------------- 4:36 AM on 04/05/2015 -----------------------------------------  Delay due to difficulty obtaining lab and venous access. I have discussed the case with Dr. Sheryle Hail who will evaluate patient in the ED for hospital admission. ____________________________________________   FINAL CLINICAL IMPRESSION(S) / ED DIAGNOSES  Final diagnoses:  Cellulitis of left foot  Acute renal  insufficiency Fever    [name], MD 04/05/15 [phone]

## 2015-04-05 NOTE — Consult Note (Signed)
ORTHOPAEDIC CONSULTATION  REQUESTING PHYSICIAN: Shaune PollackQing Chen, MD  Chief Complaint: Left foot pain.  HPI: Juan BlossomWaltzie L Bielak Jr. is a 44 y.o. male who complains of  redness and swelling to his left foot. On approximately July 1 he stepped on a screw that penetrated his shoe and sock that he was wearing. He was seen in the emergency department that day. Placed on antibiotics and discharged home. States over the weekend he's noticed increased redness and swelling into his left foot. Was seen in the emergency department yesterday and admitted and started on antibiotics. I was consult to for evaluation and treatment. He does complain of fever and chills through the weekend.  Past Medical History  Diagnosis Date  . Diabetes mellitus without complication   . Hypertension   . Chronic kidney disease   . GERD (gastroesophageal reflux disease)    Past Surgical History  Procedure Laterality Date  . No past surgeries     History   Social History  . Marital Status: Single    Spouse Name: N/A  . Number of Children: N/A  . Years of Education: N/A   Social History Main Topics  . Smoking status: Never Smoker   . Smokeless tobacco: Not on file  . Alcohol Use: No  . Drug Use: No  . Sexual Activity: No   Other Topics Concern  . None   Social History Narrative   Lives with his wife who is an Charity fundraiserN   Family History  Problem Relation Age of Onset  . Coronary artery disease Father   . Pancreatic cancer Mother   . Breast cancer Sister   . Lung cancer Brother    Allergies  Allergen Reactions  . Sulfur Other (See Comments)    Renal failure   Prior to Admission medications   Medication Sig Start Date End Date Taking? Authorizing Provider  amoxicillin-clavulanate (AUGMENTIN) 875-125 MG per tablet Take 1 tablet by mouth every 12 (twelve) hours. 7 days 04/02/15  Yes Evon Slackhomas C Gaines, PA-C  cloNIDine (CATAPRES) 0.1 MG tablet Take 0.1 mg by mouth 2 (two) times daily.   Yes Historical Provider, MD   furosemide (LASIX) 40 MG tablet Take 40 mg by mouth daily.   Yes Historical Provider, MD  oxyCODONE-acetaminophen (ROXICET) 5-325 MG per tablet Take 1-2 tablets by mouth every 6 (six) hours as needed for severe pain. 04/02/15  Yes Evon Slackhomas C Gaines, PA-C  patiromer [facility](VELTASSA) 8.4 G packet Take 8.4 g by mouth daily.   Yes Historical Provider, MD   Dg Foot Complete Left  04/05/2015   CLINICAL DATA:  Worsening redness and swelling to the left foot after a screw was stuck in the foot.  EXAM: LEFT FOOT - COMPLETE 3+ VIEW  COMPARISON:  04/02/2015  FINDINGS: Forefoot soft tissue swelling is grossly stable. Previously seen soft tissue gas is no longer visible. There is no evidence of opaque foreign body, fracture, or osseous infection.  IMPRESSION: Forefoot soft tissue swelling without foreign body or soft tissue gas. No acute osseous findings.   Electronically Signed   By: Marnee SpringJonathon  Watts M.D.   On: 04/05/2015 04:59    Positive ROS: All other systems have been reviewed and were otherwise negative with the exception of those mentioned in the HPI and as above.  12 point ROS was performed.  Physical Exam: General: Alert and oriented.  No apparent distress.  Vascular:  Left foot:Dorsalis Pedis:  present Posterior Tibial:  present  Right foot: Dorsalis Pedis:  present Posterior Tibial:  present  Neuro: Patient has some decreased sensation with gross palpation to the left great toe. Sensation is intact from midfoot proximally.  Derm: On the plantar aspect of the left forefoot there is a noted puncture site. With Compression there is a scant amount of purulent drainage. He has noted cellulitis to the dorsal midfoot ending at approximately the ankle at this time. The right foot has no ulcers or lesions.  Ortho/MS: He hasn't noted edema from the toes to the knee. He has pain with palpation to the forefoot. No pain with ankle range of motion. No crepitance with midfoot range of motion.   Assessment: Infection  from puncture wound left forefoot  Plan: I reviewed the x-rays and there is no signs of fracturing of the forefoot. He likely has a soft tissue abscess from the puncture site. I've recommended operative incision and drainage. I discussed the risks benefits alternatives and competitions associated with surgery. Verbal consent has been given. Nothing by mouth overnight. We'll make him nonweightbearing on his left foot postoperatively. A deep culture was taken today at bedside.    [name], DPM Cell [phone]   04/05/2015 4:46 PM

## 2015-04-05 NOTE — Progress Notes (Signed)
[facility] [place] at [facility]   PATIENT NAME: Juan Roberson    MR#:  [id number]  DATE OF BIRTH:  [birth year]  SUBJECTIVE:  CHIEF COMPLAINT:   Chief Complaint  Patient presents with  . Foot Pain  still left foot swelling, red and tender.  REVIEW OF SYSTEMS:  CONSTITUTIONAL:  fever, no fatigue or weakness.  EYES: No blurred or double vision.  EARS, NOSE, AND THROAT: No tinnitus or ear pain.  RESPIRATORY: No cough, shortness of breath, wheezing or hemoptysis.  CARDIOVASCULAR: No chest pain, orthopnea, edema.  GASTROINTESTINAL: No nausea, vomiting, diarrhea or abdominal pain.  GENITOURINARY: No dysuria, hematuria.  ENDOCRINE: No polyuria, nocturia,  HEMATOLOGY: No anemia, easy bruising or bleeding SKIN: No rash or lesion. MUSCULOSKELETAL: No joint pain or arthritis.  Left foot swelling, red and tender. NEUROLOGIC: No tingling, numbness, weakness.  PSYCHIATRY: No anxiety or depression.   DRUG ALLERGIES:   Allergies  Allergen Reactions  . Sulfur Other (See Comments)    Renal failure    VITALS:  Blood pressure 157/54, pulse 72, temperature 98.2 F (36.8 C), temperature source Oral, resp. rate 18, height  (1.88 m), weight 99.791 kg (220 lb), SpO2 94 %.  PHYSICAL EXAMINATION:  GENERAL:  44 y.o.-year-old patient lying in the bed with no acute distress.  EYES: Pupils equal, round, reactive to light and accommodation. No scleral icterus. Extraocular muscles intact.  HEENT: Head atraumatic, normocephalic. Oropharynx and nasopharynx clear.  NECK:  Supple, no jugular venous distention. No thyroid enlargement, no tenderness.  LUNGS: Normal breath sounds bilaterally, no wheezing, rales,rhonchi or crepitation. No use of accessory muscles of respiration.  CARDIOVASCULAR: S1, S2 normal. No murmurs, rubs, or gallops.  ABDOMEN: Soft, nontender, nondistended. Bowel sounds present. No organomegaly or mass.  EXTREMITIES: No pedal edema, cyanosis, or clubbing.  Left foot swelling, erythema and tenderness. 0.5 cm puncture woound on palm without discharge. NEUROLOGIC: Cranial nerves II through XII are intact. Muscle strength 5/5 in all extremities. Sensation intact. Gait not checked.  PSYCHIATRIC: The patient is alert and oriented x 3.  SKIN: No obvious rash, lesion, or ulcer.    LABORATORY PANEL:   CBC  Recent Labs Lab 04/05/15 0440  WBC 10.1  HGB 8.7*  HCT 27.0*  PLT 184   ------------------------------------------------------------------------------------------------------------------  Chemistries   Recent Labs Lab 04/05/15 0440  NA 134*  K 4.0  CL 99*  CO2 23  GLUCOSE 318*  BUN 91*  CREATININE 2.75*  CALCIUM 9.1   ------------------------------------------------------------------------------------------------------------------  Cardiac Enzymes No results for input(s): TROPONINI in the last 168 hours. ------------------------------------------------------------------------------------------------------------------  RADIOLOGY:  Dg Foot Complete Left  04/05/2015   CLINICAL DATA:  Worsening redness and swelling to the left foot after a screw was stuck in the foot.  EXAM: LEFT FOOT - COMPLETE 3+ VIEW  COMPARISON:  04/02/2015  FINDINGS: Forefoot soft tissue swelling is grossly stable. Previously seen soft tissue gas is no longer visible. There is no evidence of opaque foreign body, fracture, or osseous infection.  IMPRESSION: Forefoot soft tissue swelling without foreign body or soft tissue gas. No acute osseous findings.   Electronically Signed   By: Marnee Spring M.D.   On: 04/05/2015 04:59    EKG:   Orders placed or performed in visit on 07/26/14  . EKG 12-Lead    ASSESSMENT AND PLAN:   1. Left foot cellulitis:  Cont vancomycin and zosyn, follow up blood cultures. Podiatry consult.  2. 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  IVF and f/u  BMP. Hold lasix.  * Hyponatremia. Cont NS iv. F/u BMP.  3. Diabetes mellitus type 2: not  controlled. on sliding scale insulin. Start lantus 16 units SQ daily.  4. HTN, cont clonidine.     All the records are reviewed and case discussed with Care Management/Social Workerr. Management plans discussed with the patient, family and they are in agreement.  CODE STATUS: Full code.  TOTAL TIME TAKING CARE OF THIS PATIENT: 38minutes.   POSSIBLE D/C IN 3-4 DAYS, DEPENDING ON CLINICAL CONDITION.   Shaune Pollackhen, Anthonia Monger M.D on 04/05/2015 at 11:26 AM  Between 7am to 6pm - Pager - [phone]  After 6pm go to [email or web address] - password [email or web address]  [place] [facility]  Office  [phone]  CC: Primary care physician; Corky DownsMASOUD,JAVED, MD

## 2015-04-06 ENCOUNTER — Inpatient Hospital Stay: Payer: Worker's Compensation | Admitting: Certified Registered"

## 2015-04-06 ENCOUNTER — Encounter: Payer: Self-pay | Admitting: Podiatry

## 2015-04-06 ENCOUNTER — Encounter
Admission: EM | Disposition: A | Discharge status: Home / Self Care | Payer: Self-pay | Source: Home / Self Care | Attending: Internal Medicine

## 2015-04-06 HISTORY — PX: I&D EXTREMITY: SHX5045

## 2015-04-06 LAB — BASIC METABOLIC PANEL
Anion gap: 8 (ref 5–15)
BUN: 72 mg/dL — ABNORMAL HIGH (ref 6–20)
CALCIUM: 9.6 mg/dL (ref 8.9–10.3)
CO2: 24 mmol/L (ref 22–32)
Chloride: 108 mmol/L (ref 101–111)
Creatinine, Ser: 2.74 mg/dL — ABNORMAL HIGH (ref 0.61–1.24)
GFR calc Af Amer: 30 mL/min — ABNORMAL LOW (ref >60)
GFR calc non Af Amer: 26 mL/min — ABNORMAL LOW (ref >60)
Glucose, Bld: 221 mg/dL — ABNORMAL HIGH (ref 65–99)
Potassium: 4.3 mmol/L (ref 3.5–5.1)
Sodium: 140 mmol/L (ref 135–145)

## 2015-04-06 LAB — SURGICAL PCR SCREEN
MRSA, PCR: NEGATIVE
STAPHYLOCOCCUS AUREUS: NEGATIVE

## 2015-04-06 LAB — GLUCOSE, CAPILLARY
Glucose-Capillary: 212 mg/dL — ABNORMAL HIGH (ref 65–99)
Glucose-Capillary: 192 mg/dL — ABNORMAL HIGH (ref 65–99)
Glucose-Capillary: 157 mg/dL — ABNORMAL HIGH (ref 65–99)
Glucose-Capillary: 191 mg/dL — ABNORMAL HIGH (ref 65–99)

## 2015-04-06 LAB — CBC
HEMATOCRIT: 28.6 % — AB (ref 40.0–52.0)
Hemoglobin: 9.1 g/dL — ABNORMAL LOW (ref 13.0–18.0)
MCH: 27.3 pg (ref 26.0–34.0)
MCHC: 31.7 g/dL — ABNORMAL LOW (ref 32.0–36.0)
MCV: 86.1 fL (ref 80.0–100.0)
PLATELETS: 217 10*3/uL (ref 150–440)
RBC: 3.32 MIL/uL — ABNORMAL LOW (ref 4.40–5.90)
RDW: 12.3 % (ref 11.5–14.5)
WBC: 8.5 10*3/uL (ref 3.8–10.6)

## 2015-04-06 SURGERY — IRRIGATION AND DEBRIDEMENT EXTREMITY
Anesthesia: General | Site: Foot | Laterality: Left | Wound class: Dirty or Infected

## 2015-04-06 MED ORDER — EPHEDRINE SULFATE 50 MG/ML IJ SOLN
INTRAMUSCULAR | Status: DC | PRN
  Administered 2015-04-06: 5 mg via INTRAVENOUS

## 2015-04-06 MED ORDER — MIDAZOLAM HCL 2 MG/2ML IJ SOLN
INTRAMUSCULAR | Status: DC | PRN
  Administered 2015-04-06: 2 mg via INTRAVENOUS

## 2015-04-06 MED ORDER — LIDOCAINE HCL 1 % IJ SOLN
INTRAMUSCULAR | Status: DC | PRN
  Administered 2015-04-06: 5 mL

## 2015-04-06 MED ORDER — AMLODIPINE BESYLATE 10 MG PO TABS
10.0000 mg | ORAL_TABLET | Freq: Every day | ORAL | Status: DC
  Administered 2015-04-07 – 2015-04-08 (×2): 10 mg via ORAL
  Filled 2015-04-06 (×2): qty 1

## 2015-04-06 MED ORDER — PROPOFOL 10 MG/ML IV BOLUS
INTRAVENOUS | Status: DC | PRN
  Administered 2015-04-06: 160 mg via INTRAVENOUS

## 2015-04-06 MED ORDER — SODIUM CHLORIDE 0.9 % IV SOLN
80.0000 mg | Freq: Once | INTRAVENOUS | Status: AC
  Administered 2015-04-06: 80 mg via INTRAVENOUS
  Filled 2015-04-06: qty 80

## 2015-04-06 MED ORDER — SODIUM CHLORIDE 0.9 % IV SOLN
8.0000 mg/h | INTRAVENOUS | Status: DC
  Administered 2015-04-07 (×2): 8 mg/h via INTRAVENOUS
  Filled 2015-04-06 (×2): qty 80

## 2015-04-06 MED ORDER — PANTOPRAZOLE SODIUM 40 MG PO TBEC
40.0000 mg | DELAYED_RELEASE_TABLET | Freq: Every day | ORAL | Status: DC
  Filled 2015-04-06: qty 1

## 2015-04-06 MED ORDER — AMLODIPINE BESYLATE 5 MG PO TABS
5.0000 mg | ORAL_TABLET | Freq: Every day | ORAL | Status: DC
  Administered 2015-04-06: 5 mg via ORAL
  Filled 2015-04-06: qty 1

## 2015-04-06 MED ORDER — CALCIUM CARBONATE ANTACID 500 MG PO CHEW
1.0000 | CHEWABLE_TABLET | Freq: Four times a day (QID) | ORAL | Status: DC | PRN
  Administered 2015-04-06 – 2015-04-07 (×2): 200 mg via ORAL
  Filled 2015-04-06 (×2): qty 1

## 2015-04-06 MED ORDER — FENTANYL CITRATE (PF) 100 MCG/2ML IJ SOLN: 25.0000 ug | INTRAMUSCULAR | Status: DC | PRN

## 2015-04-06 MED ORDER — LIDOCAINE-EPINEPHRINE 1 %-1:100000 IJ SOLN
INTRAMUSCULAR | Status: DC | PRN
  Administered 2015-04-06: 2.5 mL

## 2015-04-06 MED ORDER — ONDANSETRON HCL 4 MG/2ML IJ SOLN: 4.0000 mg | Freq: Once | INTRAMUSCULAR | Status: AC | PRN

## 2015-04-06 MED ORDER — LIDOCAINE HCL (CARDIAC) 20 MG/ML IV SOLN
INTRAVENOUS | Status: DC | PRN
  Administered 2015-04-06: 100 mg via INTRAVENOUS

## 2015-04-06 MED ORDER — LABETALOL HCL 5 MG/ML IV SOLN
5.0000 mg | INTRAVENOUS | Status: DC | PRN
  Administered 2015-04-06 (×2): 5 mg via INTRAVENOUS
  Filled 2015-04-06 (×3): qty 4

## 2015-04-06 MED ORDER — FENTANYL CITRATE (PF) 100 MCG/2ML IJ SOLN
INTRAMUSCULAR | Status: DC | PRN
  Administered 2015-04-06: 50 ug via INTRAVENOUS

## 2015-04-06 MED ORDER — BUPIVACAINE HCL 0.5 % IJ SOLN
INTRAMUSCULAR | Status: DC | PRN
  Administered 2015-04-06: 5 mL

## 2015-04-06 SURGICAL SUPPLY — 54 items
BANDAGE ELASTIC 4 CLIP ST LF (GAUZE/BANDAGES/DRESSINGS) ×2 IMPLANT
BANDAGE STRETCH 3X4.1 STRL (GAUZE/BANDAGES/DRESSINGS) ×2 IMPLANT
BLADE OSC/SAGITTAL MD 5.5X18 (BLADE) IMPLANT
BLADE OSCILLATING/SAGITTAL (BLADE)
BLADE SURG 15 STRL LF DISP TIS (BLADE) IMPLANT
BLADE SURG 15 STRL SS (BLADE)
BLADE SW THK.38XMED LNG THN (BLADE) IMPLANT
BNDG COHESIVE 4X5 TAN STRL (GAUZE/BANDAGES/DRESSINGS) IMPLANT
BNDG COHESIVE 6X5 TAN STRL LF (GAUZE/BANDAGES/DRESSINGS) IMPLANT
BNDG ESMARK 4X12 TAN STRL LF (GAUZE/BANDAGES/DRESSINGS) ×2 IMPLANT
BNDG GAUZE 4.5X4.1 6PLY STRL (MISCELLANEOUS) ×2 IMPLANT
CANISTER SUCT 1200ML W/VALVE (MISCELLANEOUS) ×2 IMPLANT
CANISTER SUCT 3000ML (MISCELLANEOUS) ×2 IMPLANT
CUFF TOURN 18 STER (MISCELLANEOUS) IMPLANT
CUFF TOURN DUAL PL 12 NO SLV (MISCELLANEOUS) ×2 IMPLANT
DRAPE FLUOR MINI C-ARM 54X84 (DRAPES) IMPLANT
DRAPE XRAY CASSETTE 23X24 (DRAPES) IMPLANT
DURAPREP 26ML APPLICATOR (WOUND CARE) ×2 IMPLANT
GAUZE PACKING 1/4X5YD (GAUZE/BANDAGES/DRESSINGS) IMPLANT
GAUZE PACKING IODOFORM 1X5 (MISCELLANEOUS) ×2 IMPLANT
GAUZE PETRO XEROFOAM 1X8 (MISCELLANEOUS) IMPLANT
GAUZE SPONGE 4X4 12PLY STRL (GAUZE/BANDAGES/DRESSINGS) ×2 IMPLANT
GAUZE STRETCH 2X75IN STRL (MISCELLANEOUS) IMPLANT
GLOVE BIO SURGEON STRL SZ7.5 (GLOVE) ×2 IMPLANT
GLOVE INDICATOR 8.0 STRL GRN (GLOVE) ×2 IMPLANT
GOWN STRL REUS W/TWL MED LVL3 (GOWN DISPOSABLE) ×4 IMPLANT
HANDPIECE SUCTION TUBG SURGILV (MISCELLANEOUS) ×2 IMPLANT
HANDPIECE VERSAJET DEBRIDEMENT (MISCELLANEOUS) IMPLANT
IV NS 1000ML (IV SOLUTION)
IV NS 1000ML BAXH (IV SOLUTION) IMPLANT
KIT RM TURNOVER STRD PROC AR (KITS) ×2 IMPLANT
LABEL OR SOLS (LABEL) ×2 IMPLANT
NDL SAFETY 25GX1.5 (NEEDLE) ×2 IMPLANT
NEEDLE FILTER BLUNT 18X 1/2SAF (NEEDLE)
NEEDLE FILTER BLUNT 18X1 1/2 (NEEDLE) IMPLANT
NS IRRIG 500ML POUR BTL (IV SOLUTION) ×4 IMPLANT
PACK EXTREMITY ARMC (MISCELLANEOUS) ×2 IMPLANT
PAD ABD DERMACEA PRESS 5X9 (GAUZE/BANDAGES/DRESSINGS) ×2 IMPLANT
PAD GROUND ADULT SPLIT (MISCELLANEOUS) ×2 IMPLANT
PENCIL ELECTRO HAND CTR (MISCELLANEOUS) IMPLANT
RASP SM TEAR CROSS CUT (RASP) IMPLANT
SOL .9 NS 3000ML IRR  AL (IV SOLUTION) ×1
SOL .9 NS 3000ML IRR UROMATIC (IV SOLUTION) ×1 IMPLANT
STOCKINETTE IMPERVIOUS 9X36 MD (GAUZE/BANDAGES/DRESSINGS) ×2 IMPLANT
SUT ETHILON 2 0 FS 18 (SUTURE) ×2 IMPLANT
SUT ETHILON 4-0 (SUTURE)
SUT ETHILON 4-0 FS2 18XMFL BLK (SUTURE)
SUT VIC AB 3-0 SH 27 (SUTURE)
SUT VIC AB 3-0 SH 27X BRD (SUTURE) IMPLANT
SUT VIC AB 4-0 FS2 27 (SUTURE) IMPLANT
SUTURE ETHLN 4-0 FS2 18XMF BLK (SUTURE) IMPLANT
SWAB CULTURE AMIES ANAERIB BLU (MISCELLANEOUS) ×2 IMPLANT
SYR 3ML LL SCALE MARK (SYRINGE) IMPLANT
SYRINGE 10CC LL (SYRINGE) ×4 IMPLANT

## 2015-04-06 NOTE — Progress Notes (Signed)
PT S/P INCISION AND DRAINAGE LEFT FOOT . DENIES ANY PAIN. DRESSING WITH ACE INTACT.LESS REDNESS AND  SWELLING IN TOES LEFT FOOT AFTER SURGERY. ELEVATED ON 2 PILLOWS

## 2015-04-06 NOTE — Progress Notes (Signed)
PT Cancellation Note  Patient Details Name: Juan BlossomWaltzie L Freeberg Jr. MRN: [id number] DOB: 10/01/1971   Cancelled Treatment:    Reason Eval/Treat Not Completed:  (See PT note for further details) Per pt's surgeon, due to surgery being performed today, PT evaluation will be attempted tomorrow.   Juan DunksCasey Lala Been 04/06/2015, 3:38 PM  Juan Roberson, SPT. [phone]

## 2015-04-06 NOTE — Progress Notes (Signed)
Notified Dr Clint GuyHower of pt spitting blood.

## 2015-04-06 NOTE — Transfer of Care (Signed)
Immediate Anesthesia Transfer of Care Note  Patient: Juan BlossomWaltzie L Foulkes Jr.  Procedure(s) Performed: Procedure(s): IRRIGATION AND DEBRIDEMENT EXTREMITY (Left)  Patient Location: PACU  Anesthesia Type:General  Level of Consciousness: awake, alert , oriented and patient cooperative  Airway & Oxygen Therapy: Patient Spontanous Breathing and Patient connected to face mask oxygen  Post-op Assessment: Report given to RN, Post -op Vital signs reviewed and stable and Patient moving all extremities X 4  Post vital signs: Reviewed and stable  Last Vitals:  Filed Vitals:   04/06/15 1310  BP: 160/70  Pulse: 99  Temp: 37.6 C  Resp: 16    Complications: No apparent anesthesia complications

## 2015-04-06 NOTE — Anesthesia Preprocedure Evaluation (Addendum)
Anesthesia Evaluation  Patient identified by MRN, date of birth, ID band Patient awake    Reviewed: Allergy & Precautions, H&P , NPO status , Patient's Chart, lab work & pertinent test results, reviewed documented beta blocker date and time   Airway Mallampati: II  TM Distance: >3 FB Neck ROM: full    Dental   Pulmonary          Cardiovascular hypertension, Normal cardiovascular examRate:Normal     Neuro/Psych    GI/Hepatic GERD-  ,  Endo/Other  diabetes  Renal/GU Renal disease     Musculoskeletal   Abdominal   Peds  Hematology   Anesthesia Other Findings   Reproductive/Obstetrics                            Anesthesia Physical Anesthesia Plan  ASA: II  Anesthesia Plan: General LMA   Post-op Pain Management:    Induction:   Airway Management Planned:   Additional Equipment:   Intra-op Plan:   Post-operative Plan:   Informed Consent: I have reviewed the patients History and Physical, chart, labs and discussed the procedure including the risks, benefits and alternatives for the proposed anesthesia with the patient or authorized representative who has indicated his/her understanding and acceptance.     Plan Discussed with: CRNA  Anesthesia Plan Comments:         Anesthesia Quick Evaluation

## 2015-04-06 NOTE — Progress Notes (Signed)
No complaints of pain in foot. Foot with decreased redness and swelling.  Remaining on iv antibiotics. Surgery later on today.

## 2015-04-06 NOTE — Anesthesia Procedure Notes (Signed)
Procedure Name: LMA Insertion Date/Time: 04/06/2015 12:26 PM Performed by: Michaele OfferSAVAGE, Daijah Scrivens Pre-anesthesia Checklist: Patient identified, Emergency Drugs available, Suction available, Patient being monitored and Timeout performed Patient Re-evaluated:Patient Re-evaluated prior to inductionOxygen Delivery Method: Circle system utilized Preoxygenation: Pre-oxygenation with 100% oxygen Intubation Type: IV induction Ventilation: Mask ventilation without difficulty LMA: LMA inserted LMA Size: 4.5 Number of attempts: 1 Placement Confirmation: positive ETCO2 and breath sounds checked- equal and bilateral Tube secured with: Tape Dental Injury: Teeth and Oropharynx as per pre-operative assessment

## 2015-04-06 NOTE — Progress Notes (Signed)
Called by nursing staff patient hematemesis Patient evaluated bedside, had 2 episodes of emesis one episode involving a blood clot currently asymptomatic at this time vital signs within normal limits Will initiate Protonix bolus as well as continuous infusion monitor for further symptomatology, continue Zofran as needed I have suspended heparin at this time given bleeding

## 2015-04-06 NOTE — Progress Notes (Signed)
Pt complains of heartburn. Notified Dr. Clint GuyHower. He will place an order for medication

## 2015-04-06 NOTE — Progress Notes (Signed)
[facility] - [place] at [facility]   PATIENT NAME: Juan ButtersWaltzie Roberson    MR#:  [id number]  DATE OF BIRTH:  01/12/1971  SUBJECTIVE:  CHIEF COMPLAINT:   Chief Complaint  Patient presents with  . Foot Pain  still left foot swelling, red and tender. Fever and chills, vomiting once.   REVIEW OF SYSTEMS:  CONSTITUTIONAL: has  Fever and chills, no fatigue or weakness.  EYES: No blurred or double vision.  EARS, NOSE, AND THROAT: No tinnitus or ear pain.  RESPIRATORY: No cough, shortness of breath, wheezing or hemoptysis.  CARDIOVASCULAR: No chest pain, orthopnea, edema.  GASTROINTESTINAL: has nausea, vomiting, no diarrhea or abdominal pain.  GENITOURINARY: No dysuria, hematuria.  ENDOCRINE: No polyuria, nocturia,  HEMATOLOGY: No anemia, easy bruising or bleeding SKIN: No rash or lesion. MUSCULOSKELETAL: No joint pain or arthritis.  Left foot swelling, red and tender. NEUROLOGIC: No tingling, numbness, weakness.  PSYCHIATRY: No anxiety or depression.   DRUG ALLERGIES:   Allergies  Allergen Reactions  . Sulfur Other (See Comments)    Renal failure    VITALS:  Blood pressure 162/66, pulse 94, temperature 100.4 F (38 C), temperature source Oral, resp. rate 18, height 6\' 2"  (1.88 m), weight 98.793 kg (217 lb 12.8 oz), SpO2 94 %.  PHYSICAL EXAMINATION:  GENERAL:  44 y.o.-year-old patient lying in the bed with no acute distress.  EYES: Pupils equal, round, reactive to light and accommodation. No scleral icterus. Extraocular muscles intact.  HEENT: Head atraumatic, normocephalic. Oropharynx and nasopharynx clear.  NECK:  Supple, no jugular venous distention. No thyroid enlargement, no tenderness.  LUNGS: Normal breath sounds bilaterally, no wheezing, rales,rhonchi or crepitation. No use of accessory muscles of respiration.  CARDIOVASCULAR: S1, S2 normal. No murmurs, rubs, or gallops.  ABDOMEN: Soft, nontender, nondistended. Bowel sounds present. No organomegaly or  mass.  EXTREMITIES: No pedal edema, cyanosis, or clubbing. Left foot swelling, erythema and tenderness, in dressing but with discharge. NEUROLOGIC: Cranial nerves II through XII are intact. Muscle strength 5/5 in all extremities. Sensation intact. Gait not checked.  PSYCHIATRIC: The patient is alert and oriented x 3.  SKIN: No obvious rash, lesion, or ulcer.    LABORATORY PANEL:   CBC  Recent Labs Lab 04/06/15 0652  WBC 8.5  HGB 9.1*  HCT 28.6*  PLT 217   ------------------------------------------------------------------------------------------------------------------  Chemistries   Recent Labs Lab 04/06/15 0652  NA 140  K 4.3  CL 108  CO2 24  GLUCOSE 221*  BUN 72*  CREATININE 2.74*  CALCIUM 9.6   ------------------------------------------------------------------------------------------------------------------  Cardiac Enzymes No results for input(s): TROPONINI in the last 168 hours. ------------------------------------------------------------------------------------------------------------------  RADIOLOGY:  Dg Foot Complete Left  04/05/2015   CLINICAL DATA:  Worsening redness and swelling to the left foot after a screw was stuck in the foot.  EXAM: LEFT FOOT - COMPLETE 3+ VIEW  COMPARISON:  04/02/2015  FINDINGS: Forefoot soft tissue swelling is grossly stable. Previously seen soft tissue gas is no longer visible. There is no evidence of opaque foreign body, fracture, or osseous infection.  IMPRESSION: Forefoot soft tissue swelling without foreign body or soft tissue gas. No acute osseous findings.   Electronically Signed   By: Marnee SpringJonathon  Watts M.D.   On: 04/05/2015 04:59    EKG:   Orders placed or performed in visit on 07/26/14  . EKG 12-Lead    ASSESSMENT AND PLAN:   1. Left foot cellulitis with abcess:  Cont vancomycin and zosyn, follow up blood cultures. Wound culture:  Staph. A. F/u Dr. Ether Griffins for I&D today.   2. 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 Better.  Continue IVF  and f/u BMP. Hold lasix.  * Hyponatremia. improved.  3. Diabetes mellitus type 2: better controlled. on sliding scale insulin. Start lantus 16 units SQ daily.  4. HTN, cont clonidine, add norvasc.     All the records are reviewed and case discussed with Care Management/Social Workerr. Management plans discussed with the patient, his daughter and they are in agreement.  CODE STATUS: Full code.  TOTAL TIME TAKING CARE OF THIS PATIENT: 37 minutes.   POSSIBLE D/C IN 3 DAYS, DEPENDING ON CLINICAL CONDITION.   Shaune Pollack M.D on 04/06/2015 at 10:54 AM  Between 7am to 6pm - Pager - [phone]  After 6pm go to [email or web address] - password [email or web address] [facility]  [place] [facility]  Office  [phone]  CC: Primary care physician; Corky Downs, MD

## 2015-04-06 NOTE — Op Note (Signed)
Operative note   Surgeon:Alishah Schulte    Assistant:none    Preop diagnosis:left foot infection from puncture wound    Postop diagnosis:left foot puncture wound with abscess    Procedure:incision and drainage left foot puncture wound with abscess    ZOX:[id number]:minimal    Anesthesia:local and general    Hemostasis:ankle tourniquet inflated to 250 mmHg for approximately 10 minutes    Specimen:wound culture from deep space abscess    Complications:none    Operative indications:44 year old gentleman who stepped on a screw at 5 days ago. Has developed infection and cellulitis to his left foot. He was admitted and daily with infection from the puncture site. He presents today for surgical intervention. The risks benefits alternatives and competitions associated were discussed with the patient and consent has been given    Procedure:  Patient was brought into the OR and placed on the operating table in thesupine position. After anesthesia was obtained theleft lower extremity was prepped and draped in usual sterile fashion.  Attention was directed to the plantar aspect of the left foot where the puncture site was noted proximally the third webspace. A longitudinal incision was made plantarly. Blunt dissection was taken through the subcutaneous tissue. Further blunt dissection was taken medial lateral distal and proximal. At amount of purulent drainage was noted to plantar to the third metatarsophalangeal joint. This was flushed with copious amounts of irrigation. The residual foreign body were noted in the area. The necrotic tissue were noted. After I flushed the area with a liter of saline. As the proximal and distal aspect of the incision site. Intraoperative puncture site was left packed open with iodoform dressing.    Patient tolerated the procedure and anesthesia well.  Was transported from the OR to the PACU with all vital signs stable and vascular status intact. To be discharged per routine  protocol.  Will follow up in approximately 1 week in the outpatient clinic.will be readmitted to the floor with further evaluation.

## 2015-04-07 ENCOUNTER — Encounter
Admission: EM | Disposition: A | Discharge status: Home / Self Care | Payer: Self-pay | Source: Home / Self Care | Attending: Internal Medicine

## 2015-04-07 ENCOUNTER — Inpatient Hospital Stay: Payer: Worker's Compensation | Admitting: Anesthesiology

## 2015-04-07 ENCOUNTER — Encounter: Payer: Self-pay | Admitting: Urgent Care

## 2015-04-07 DIAGNOSIS — K2970 Gastritis, unspecified, without bleeding: Secondary | ICD-10-CM | POA: Insufficient documentation

## 2015-04-07 DIAGNOSIS — K2980 Duodenitis without bleeding: Secondary | ICD-10-CM | POA: Insufficient documentation

## 2015-04-07 DIAGNOSIS — K209 Esophagitis, unspecified without bleeding: Secondary | ICD-10-CM | POA: Insufficient documentation

## 2015-04-07 DIAGNOSIS — K920 Hematemesis: Secondary | ICD-10-CM

## 2015-04-07 DIAGNOSIS — R110 Nausea: Secondary | ICD-10-CM

## 2015-04-07 HISTORY — PX: ESOPHAGOGASTRODUODENOSCOPY: SHX5428

## 2015-04-07 LAB — BASIC METABOLIC PANEL
ANION GAP: 13 (ref 5–15)
BUN: 53 mg/dL — AB (ref 6–20)
CHLORIDE: 106 mmol/L (ref 101–111)
CO2: 22 mmol/L (ref 22–32)
Calcium: 9.3 mg/dL (ref 8.9–10.3)
Creatinine, Ser: 2.37 mg/dL — ABNORMAL HIGH (ref 0.61–1.24)
GFR calc non Af Amer: 31 mL/min — ABNORMAL LOW (ref >60)
GFR, EST AFRICAN AMERICAN: 36 mL/min — AB (ref >60)
GLUCOSE: 218 mg/dL — AB (ref 65–99)
POTASSIUM: 4.1 mmol/L (ref 3.5–5.1)
SODIUM: 141 mmol/L (ref 135–145)

## 2015-04-07 LAB — CBC
HCT: 27.8 % — ABNORMAL LOW (ref 40.0–52.0)
Hemoglobin: 8.8 g/dL — ABNORMAL LOW (ref 13.0–18.0)
MCH: 26.9 pg (ref 26.0–34.0)
MCHC: 31.6 g/dL — AB (ref 32.0–36.0)
MCV: 85.3 fL (ref 80.0–100.0)
PLATELETS: 208 10*3/uL (ref 150–440)
RBC: 3.26 MIL/uL — ABNORMAL LOW (ref 4.40–5.90)
RDW: 12.4 % (ref 11.5–14.5)
WBC: 9.5 10*3/uL (ref 3.8–10.6)

## 2015-04-07 LAB — GLUCOSE, CAPILLARY
Glucose-Capillary: 194 mg/dL — ABNORMAL HIGH (ref 65–99)
Glucose-Capillary: 217 mg/dL — ABNORMAL HIGH (ref 65–99)
Glucose-Capillary: 141 mg/dL — ABNORMAL HIGH (ref 65–99)
Glucose-Capillary: 94 mg/dL (ref 65–99)

## 2015-04-07 LAB — VANCOMYCIN, TROUGH: Vancomycin Tr: 24 ug/mL (ref 10–20)

## 2015-04-07 SURGERY — EGD (ESOPHAGOGASTRODUODENOSCOPY)
Anesthesia: General | Wound class: Clean Contaminated

## 2015-04-07 MED ORDER — PROPOFOL 10 MG/ML IV BOLUS
INTRAVENOUS | Status: DC | PRN
  Administered 2015-04-07: 80 mg via INTRAVENOUS

## 2015-04-07 MED ORDER — ONDANSETRON HCL 4 MG/2ML IJ SOLN
4.0000 mg | Freq: Once | INTRAMUSCULAR | Status: AC | PRN
  Filled 2015-04-07: qty 2

## 2015-04-07 MED ORDER — HYDRALAZINE HCL 20 MG/ML IJ SOLN: 10.0000 mg | Freq: Four times a day (QID) | INTRAMUSCULAR | Status: DC | PRN

## 2015-04-07 MED ORDER — FENTANYL CITRATE (PF) 100 MCG/2ML IJ SOLN
INTRAMUSCULAR | Status: DC | PRN
  Administered 2015-04-07: 50 ug via INTRAVENOUS

## 2015-04-07 MED ORDER — MIDAZOLAM HCL 2 MG/2ML IJ SOLN
INTRAMUSCULAR | Status: DC | PRN
Start: 2015-04-07 — End: 2015-04-07
  Administered 2015-04-07: 1 mg via INTRAVENOUS

## 2015-04-07 MED ORDER — FENTANYL CITRATE (PF) 100 MCG/2ML IJ SOLN: 25.0000 ug | INTRAMUSCULAR | Status: DC | PRN

## 2015-04-07 MED ORDER — PROPOFOL INFUSION 10 MG/ML OPTIME
INTRAVENOUS | Status: DC | PRN
  Administered 2015-04-07: 100 ug/kg/min via INTRAVENOUS

## 2015-04-07 NOTE — Progress Notes (Signed)
[facility] [place] at [facility]   PATIENT NAME: Juan Roberson    MR#:  [id number]  DATE OF BIRTH:  05/07/1971  SUBJECTIVE:  CHIEF COMPLAINT:   Chief Complaint  Patient presents with  . Foot Pain  Bloody emesis x 3 last night.   REVIEW OF SYSTEMS:  CONSTITUTIONAL: has  Fever and chills, no fatigue or weakness.  EYES: No blurred or double vision.  EARS, NOSE, AND THROAT: No tinnitus or ear pain.  RESPIRATORY: No cough, shortness of breath, wheezing or hemoptysis.  CARDIOVASCULAR: No chest pain, orthopnea, edema.  GASTROINTESTINAL: has nausea, vomiting, no diarrhea or abdominal pain.  GENITOURINARY: No dysuria, hematuria.  ENDOCRINE: No polyuria, nocturia,  HEMATOLOGY: No anemia, easy bruising or bleeding SKIN: No rash or lesion. MUSCULOSKELETAL: No joint pain or arthritis.  Left foot in dressing. NEUROLOGIC: No tingling, numbness, weakness.  PSYCHIATRY: No anxiety or depression.   DRUG ALLERGIES:   Allergies  Allergen Reactions  . Sulfur Other (See Comments)    Renal failure    VITALS:  Blood pressure 159/79, pulse 98, temperature 98.6 F (37 C), temperature source Oral, resp. rate 14, height 6\' 2"  (1.88 m), weight 97.705 kg (215 lb 6.4 oz), SpO2 99 %.  PHYSICAL EXAMINATION:  GENERAL:  44 y.o.-year-old patient lying in the bed with no acute distress.  EYES: Pupils equal, round, reactive to light and accommodation. No scleral icterus. Extraocular muscles intact.  HEENT: Head atraumatic, normocephalic. Oropharynx and nasopharynx clear.  NECK:  Supple, no jugular venous distention. No thyroid enlargement, no tenderness.  LUNGS: Normal breath sounds bilaterally, no wheezing, rales,rhonchi or crepitation. No use of accessory muscles of respiration.  CARDIOVASCULAR: S1, S2 normal. No murmurs, rubs, or gallops.  ABDOMEN: Soft, nontender, nondistended. Bowel sounds present. No organomegaly or mass.  EXTREMITIES: No pedal edema, cyanosis, or clubbing.  Left foot swelling, erythema and tenderness, in dressing but with discharge. NEUROLOGIC: Cranial nerves II through XII are intact. Muscle strength 5/5 in all extremities. Sensation intact. Gait not checked.  PSYCHIATRIC: The patient is alert and oriented x 3.  SKIN: No obvious rash, lesion, or ulcer.    LABORATORY PANEL:   CBC  Recent Labs Lab 04/07/15 0924  WBC 9.5  HGB 8.8*  HCT 27.8*  PLT 208   ------------------------------------------------------------------------------------------------------------------  Chemistries   Recent Labs Lab 04/07/15 0924  NA 141  K 4.1  CL 106  CO2 22  GLUCOSE 218*  BUN 53*  CREATININE 2.37*  CALCIUM 9.3   ------------------------------------------------------------------------------------------------------------------  Cardiac Enzymes No results for input(s): TROPONINI in the last 168 hours. ------------------------------------------------------------------------------------------------------------------  RADIOLOGY:  No results found.  EKG:   Orders placed or performed in visit on 07/26/14  . EKG 12-Lead    ASSESSMENT AND PLAN:   1. Left foot cellulitis with abcess:  POD1 of  I&D Cont vancomycin and zosyn, neg blood cultures. Wound culture: Staph. A.  F/u sensitivity.   2. 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 improving.  Continue IVF and f/u BMP. Hold lasix.  * Hyponatremia. improved.  3. Diabetes mellitus type 2: better controlled. on sliding scale insulin. On lantus 16 units SQ daily.  4. HTN, cont clonidine, add norvasc. Iv hydralazine prn.  GIB: Bloody emesis x 3. NPO, IVF, continue protonix drip. f/u GI consult for possible EGD.   Anemia of chronic disease. Hb is stable. F/u Hb.     All the records are reviewed and case discussed with Care Management/Social Workerr. Management plans discussed with the patient, his daughter  and they are in agreement.  CODE STATUS: Full code.  TOTAL TIME TAKING CARE OF THIS PATIENT: 37  minutes.   POSSIBLE D/C IN 3 DAYS, DEPENDING ON CLINICAL CONDITION.   Shaune Pollack M.D on 04/07/2015 at 11:05 AM  Between 7am to 6pm - Pager - [phone]  After 6pm go to [email or web address] - password [email or web address] [facility]  [place] [facility]  Office  [phone]  CC: Primary care physician; Corky Downs, MD

## 2015-04-07 NOTE — Progress Notes (Signed)
Pt vomited bloody emesis x3. Started on protonix drip. Febrile, temp down after tylenol. Complained of nausea, improved after medication. Dsg dry and intact. Denies pain

## 2015-04-07 NOTE — Op Note (Signed)
[facility] Gastroenterology Patient Name: Juan Roberson Procedure Date: 04/07/2015 7:29 PM MRN: [id number] Account #: [id number] Date of Birth: 06/26/1971 Admit Type: Inpatient Age: 44 [facility] Gender: Male Note Status: Finalized Procedure:         Upper GI endoscopy Indications:       Hematemesis Providers:         Midge Miniumarren Latayna Ritchie, MD Medicines:         Propofol per Anesthesia Complications:     No immediate complications. Procedure:         Pre-Anesthesia Assessment:                    - Prior to the procedure, a History and Physical was                     performed, and patient medications and allergies were                     reviewed. The patient's tolerance of previous anesthesia                     was also reviewed. The risks and benefits of the procedure                     and the sedation options and risks were discussed with the                     patient. All questions were answered, and informed consent                     was obtained. Prior Anticoagulants: The patient has taken                     no previous anticoagulant or antiplatelet agents. ASA                     Grade Assessment: II - A patient with mild systemic                     disease. After reviewing the risks and benefits, the                     patient was deemed in satisfactory condition to undergo                     the procedure.                    After obtaining informed consent, the endoscope was passed                     under direct vision. Throughout the procedure, the                     patient's blood pressure, pulse, and oxygen saturations                     were monitored continuously. The Olympus GIF-160 endoscope                     (S#. [id number]) was introduced through the mouth, and                     advanced to the second part of duodenum. The upper GI  endoscopy was accomplished without difficulty. The patient       tolerated the procedure well. Findings:      LA Grade B (one or more mucosal breaks greater than 5 mm, not extending       between the tops of two mucosal folds) esophagitis with no bleeding was       found in the lower third of the esophagus.      Localized moderate inflammation characterized by erosions was found in       the gastric antrum. Biopsies were taken with a cold forceps for       histology.      Localized mild inflammation characterized by erythema was found in the       duodenal bulb. Impression:        - LA Grade B esophagitis.                    - Gastritis. Biopsied.                    - Duodenitis. Recommendation:    - Return patient to hospital ward for ongoing care. Procedure Code(s): --- Professional ---                    [phone], Esophagogastroduodenoscopy, flexible, transoral;                     with biopsy, single or multiple Diagnosis Code(s): --- Professional ---                    K92.0, Hematemesis                    K29.70, Gastritis, unspecified, without bleeding                    K20.9, Esophagitis, unspecified                    K29.80, Duodenitis without bleeding CPT copyright 2014 American Medical Association. All rights reserved. The codes documented in this report are preliminary and upon coder review may  be revised to meet current compliance requirements. Midge Minium, MD 04/07/2015 7:33:42 PM This report has been signed electronically. Number of Addenda: 0 Note Initiated On: 04/07/2015 7:29 PM      [facility]

## 2015-04-07 NOTE — Anesthesia Postprocedure Evaluation (Signed)
  Anesthesia Post-op Note  Patient: Juan BlossomWaltzie L Pepperman Jr.  Procedure(s) Performed: Procedure(s): ESOPHAGOGASTRODUODENOSCOPY (EGD) (N/A)  Anesthesia type:General  Patient location: PACU  Post pain: Pain level controlled  Post assessment: Post-op Vital signs reviewed, Patient's Cardiovascular Status Stable, Respiratory Function Stable, Patent Airway and No signs of Nausea or vomiting  Post vital signs: Reviewed and stable  Last Vitals:  Filed Vitals:   04/07/15 2032  BP:   Pulse:   Temp: 38.2 C  Resp:     Level of consciousness: awake, alert  and patient cooperative  Complications: No apparent anesthesia complications

## 2015-04-07 NOTE — Transfer of Care (Signed)
Immediate Anesthesia Transfer of Care Note  Patient: Juan BlossomWaltzie L Soliday Jr.  Procedure(s) Performed: Procedure(s): ESOPHAGOGASTRODUODENOSCOPY (EGD) (N/A)  Patient Location: PACU  Anesthesia Type:General  Level of Consciousness: awake, alert , oriented and patient cooperative  Airway & Oxygen Therapy: Patient Spontanous Breathing and Patient connected to nasal cannula oxygen  Post-op Assessment: Report given to RN and Post -op Vital signs reviewed and stable  Post vital signs: Reviewed and stable  Last Vitals:  Filed Vitals:   04/07/15 1117  BP: 147/74  Pulse: 85  Temp: 36.8 C  Resp:     Complications: No apparent anesthesia complications

## 2015-04-07 NOTE — Consult Note (Signed)
Gastroenterology Consultation  Referring Provider:  Dr Imogene Burn  Primary Care Physician:  Corky Downs, MD Primary Gastroenterologist:  Dr. Servando Snare (new)         Reason for Consultation:     Hematemesis  Date of Admission:  04/05/2015 Date of Consultation:  04/07/2015        HPI:   Juan Thang. is a 44 y.o. y/o male referred for consultation & management of hematemesis.  Patient was admitted with left foot cellulitis secondary to screw puncture wound and vomiting. Last night he began to have hematemesis. He describes a large  amount of blood with clot. He vomited 3 times.  He had some mid abdominal pain he rated 5/10. Pain has resolved currently. He has been on antibiotic since 5 days ago. He denies any NSAIDs. He had horrible heartburn last night.  Denies any  history of peptic ulcer disease or H. Pylori.  Weight has been stable, appetite was fine up until acute illness.   Past Medical History  Diagnosis Date  . Diabetes mellitus without complication   . Hypertension   . Chronic kidney disease   . GERD (gastroesophageal reflux disease)   . Cellulitis and abscess of foot     Left-Dr. Ether Griffins    Past Surgical History  Procedure Laterality Date  . No past surgeries    . I&d extremity Left 04/06/2015    Procedure: IRRIGATION AND DEBRIDEMENT EXTREMITY;  Surgeon: Gwyneth Revels, DPM;  Location: [facility] ORS;  Service: Podiatry;  Laterality: Left;    Prior to Admission medications   Medication Sig Start Date End Date Taking? Authorizing Provider  amoxicillin-clavulanate (AUGMENTIN) 875-125 MG per tablet Take 1 tablet by mouth every 12 (twelve) hours. 7 days 04/02/15  Yes Evon Slack, PA-C  cloNIDine (CATAPRES) 0.1 MG tablet Take 0.1 mg by mouth 2 (two) times daily.   Yes Historical Provider, MD  furosemide (LASIX) 40 MG tablet Take 40 mg by mouth daily.   Yes Historical Provider, MD  oxyCODONE-acetaminophen (ROXICET) 5-325 MG per tablet Take 1-2 tablets by mouth every 6 (six) hours as  needed for severe pain. 04/02/15  Yes Evon Slack, PA-C  patiromer [facility]) 8.4 G packet Take 8.4 g by mouth daily.   Yes Historical Provider, MD    Family History  Problem Relation Age of Onset  . Coronary artery disease Father   . Pancreatic cancer Mother   . Breast cancer Sister   . Lung cancer Brother   . Pancreatic cancer Mother      History   Social History Narrative   Single, lives alone, 3 children   History  Substance Use Topics  . Smoking status: Never Smoker   . Smokeless tobacco: Not on file  . Alcohol Use: No    Allergies as of 04/04/2015 - Review Complete 04/04/2015  Allergen Reaction Noted  . Sulfur Other (See Comments) 04/02/2015    Review of Systems:    All systems reviewed and negative except where noted in HPI.   Physical Exam:  Vital signs in last 24 hours: Temp:  [98.2 F (36.8 C)-101.9 F (38.8 C)] 98.2 F (36.8 C) (07/06 1117) Pulse Rate:  [85-110] 85 (07/06 1117) Resp:  [14-18] 14 (07/06 0724) BP: (147-188)/(74-88) 147/74 mmHg (07/06 1117) SpO2:  [97 %-100 %] 98 % (07/06 1117) Weight:  [215 lb 6.4 oz (97.705 kg)] 215 lb 6.4 oz (97.705 kg) (07/06 0512) Last BM Date: 04/04/15 Body mass index is 27.64 kg/(m^2). General:  Alert,  Well-developed, well-nourished, pleasant and cooperative in NAD, accompanied by his friend and daughter Head:  Normocephalic and atraumatic. Eyes:  Sclera clear, no icterus.   Conjunctiva pink. Ears:  Normal auditory acuity. Nose:  No deformity, discharge, or lesions. Mouth:  No deformity or lesions,oropharynx pink & moist. Neck:  Supple; no masses or thyromegaly. Lungs:  Respirations even and unlabored.  Clear throughout to auscultation.   No wheezes, crackles, or rhonchi. No acute distress. Heart:  Regular rate and rhythm; no murmurs, clicks, rubs, or gallops. Abdomen:  Normal bowel sounds.  No bruits.  Soft, non-tender and non-distended without masses, hepatosplenomegaly or hernias noted.  No guarding or  rebound tenderness.     Rectal:  Deferred.  Msk:  Symmetrical without gross deformities.  Good, equal movement bilaterally. Pulses:  Normal pulses noted. Extremities:  Dressing to left foot. No clubbing or edema.  No cyanosis. Neurologic:  Alert and oriented x3;  grossly normal neurologically. Skin:  Intact without significant lesions or rashes.  No jaundice. Lymph Nodes:  No significant cervical adenopathy. Psych:  Alert and cooperative. Normal mood and affect.  LAB RESULTS:  Recent Labs  04/05/15 0440 04/06/15 0652 04/07/15 0924  WBC 10.1 8.5 9.5  HGB 8.7* 9.1* 8.8*  HCT 27.0* 28.6* 27.8*  PLT 184 217 208   BMET  Recent Labs  04/05/15 0440 04/06/15 0652 04/07/15 0924  NA 134* 140 141  K 4.0 4.3 4.1  CL 99* 108 106  CO2 23 24 22   GLUCOSE 318* 221* 218*  BUN 91* 72* 53*  CREATININE 2.75* 2.74* 2.37*  CALCIUM 9.1 9.6 9.3      Impression / Plan:   Juan BlossomWaltzie L Hartung Jr. is a 44 y.o. y/o male with 3 episodes of hematemesis and upper abdominal pain that started after antibiotic treatment for left foot cellulitis. EGD with Dr. Servando SnareWohl today to look for Mallory-Weiss tear, gastritis or peptic ulcer disease.  I have discussed risks & benefits which include, but are not limited to, bleeding, infection, perforation & drug reaction.  The patient agrees with this plan & written consent will be obtained.    Plan: #1 agree with PPI drip #2 EGD with Dr. Servando SnareWohl #3 supportive measures  Thank you for involving me in the care of this patient.  The care of Juan BlossomWaltzie L Boteler Jr. will be discussed in direct collaboration with Dr Midge Miniumarren Wohl, Attending Gastroenterologist.   LOS: 2 days  Lorenza BurtonKandice Captain Blucher, NP  04/07/2015, 6:20 PM [facility]  [address] [place], [place] [address] Phone: [phone] Fax : [phone]

## 2015-04-07 NOTE — Care Management (Signed)
Physical therapy is recommending outpatient PT.Patient states he will need crutches which has been requested from [facility]. Rx requested from Dr. Imogene Burnhen.

## 2015-04-07 NOTE — Evaluation (Signed)
Physical Therapy Evaluation Patient Details Name: Juan BlossomWaltzie L Shea Jr. MRN: [id number] DOB: 08/10/1971 Today's Date: 04/07/2015   History of Present Illness  Pt is a  44 year old man who was admitted to the hospital for cellulitis of the left foot secondary to nail puncture through foot while at work. Pt had IND procedure performed on 04/06/15 and is currently NWB on L.   Clinical Impression  Pt presents with hx of DM, HTN, CKD, and GERD. Examination reveals that pt has fair strength, but decreased endurance. He performs all bed mobility independently and transfers at modified independent with crutches. It is slightly easier for pt to perform transfers with [facility]. Pt's decreased endurance as well as NWB status indicates that pt would benefit from skilled PT for increased endurance and prevention of atrophy secondary to NWB. Pt agreed with this recommendation.     Follow Up Recommendations Outpatient PT (Gait training with crutches. Atrophy prevention. )    Equipment Recommendations  Crutches    Recommendations for Other Services       Precautions / Restrictions Precautions Precautions: Fall Restrictions Weight Bearing Restrictions: Yes LLE Weight Bearing: Non weight bearing      Mobility  Bed Mobility Overal bed mobility: Independent             General bed mobility comments: Needs no assistance  Transfers Overall transfer level: Modified independent Equipment used: Crutches             General transfer comment: Pt transfers with modified indep and is able to manage his crutches while maintaining his NWB status  Ambulation/Gait Ambulation/Gait assistance: Modified independent (Device/Increase time);Min guard Ambulation Distance (Feet): 130 Feet Assistive device: Crutches Gait Pattern/deviations:  (3 point gait pattern on crutches) Gait velocity: decreased   General Gait Details: Pt was CGA for safety within the hospital, but he demonstrated competency with  sequencing of his ambulation with his crutches, as well as not violating his NWB status on the LLE following gait training with PT x10 min, which included crutch positioning, proper fit and sequencing of crutches with 3 point gait pattern. Pt stated fatigue towards the end of ambulation.   Stairs            Wheelchair Mobility    Modified Rankin (Stroke Patients Only)       Balance Overall balance assessment: No apparent balance deficits (not formally assessed)                                           Pertinent Vitals/Pain Pain Assessment: No/denies pain    Home Living Family/patient expects to be discharged to:: Private residence Living Arrangements: Children Available Help at Discharge: Family Type of Home: House Home Access: Level entry     Home Layout: One level Home Equipment: None      Prior Function Level of Independence: Independent         Comments: Able to complete all ADLs, occupation and driving independently      Hand Dominance        Extremity/Trunk Assessment   Upper Extremity Assessment: Overall WFL for tasks assessed           Lower Extremity Assessment: Overall WFL for tasks assessed;LLE deficits/detail (RLE WFL)   LLE Deficits / Details: NWB, not able to assess strength. Pt states no pain.      Communication   Communication:  No difficulties  Cognition Arousal/Alertness: Awake/alert Behavior During Therapy: WFL for tasks assessed/performed Overall Cognitive Status: Within Functional Limits for tasks assessed                      General Comments General comments (skin integrity, edema, etc.): No c/o pain or concern for wound site by pt.     Exercises        Assessment/Plan    PT Assessment Patient needs continued PT services  PT Diagnosis Difficulty walking;Abnormality of gait   PT Problem List Decreased strength;Decreased activity tolerance;Decreased mobility;Decreased knowledge of use of  DME  PT Treatment Interventions DME instruction;Gait training;Stair training;Functional mobility training;Therapeutic activities;Therapeutic exercise;Balance training;Neuromuscular re-education   PT Goals (Current goals can be found in the Care Plan section) Acute Rehab PT Goals Patient Stated Goal: To return home today PT Goal Formulation: With patient Time For Goal Achievement: 04/21/15 Potential to Achieve Goals: Good    Frequency 7X/week   Barriers to discharge        Co-evaluation               End of Session Equipment Utilized During Treatment: Gait belt Activity Tolerance: Patient tolerated treatment well;No increased pain Patient left: in chair;with chair alarm set;with nursing/sitter in room;with call bell/phone within reach Nurse Communication: Mobility status;Weight bearing status         Time: 1610-9604 PT Time Calculation (min) (ACUTE ONLY): 27 min   Charges:         PT G CodesBenna Dunks 04/23/2015, 11:34 AM  Benna Dunks, SPT. [phone]

## 2015-04-07 NOTE — Evaluation (Signed)
Physical Therapy Evaluation Patient Details Name: Juan Roberson. MRN: [id number] DOB: 09/28/1971 Today's Date: 04/07/2015   History of Present Illness  Pt is a  44 year old man who was admitted to the hospital for cellulitis of the left foot secondary to nail puncture through foot while at work.   Clinical Impression  Pt presents with hx of DM, HTN, CKD, and GERD. Examination reveals that pt has fair strength, but decreased endurance. He performs all bed mobility independently and transfers at modified independent with crutches. It is slightly easier for pt to perform transfers with [facility]. Pt's decreased endurance as well as NWB status indicates that pt would benefit from skilled PT for increased endurance and prevention of atrophy secondary to NWB. Pt agreed with this recommendation.     Follow Up Recommendations Outpatient PT (Gait training with crutches. Atrophy prevention. )    Equipment Recommendations  Crutches    Recommendations for Other Services       Precautions / Restrictions Precautions Precautions: Fall Restrictions Weight Bearing Restrictions: Yes LLE Weight Bearing: Non weight bearing      Mobility  Bed Mobility Overal bed mobility: Independent             General bed mobility comments: Needs no assistance  Transfers Overall transfer level: Modified independent Equipment used: Crutches             General transfer comment: Pt transfers with modified indep and is able to manage his crutches while maintaining his NWB status  Ambulation/Gait Ambulation/Gait assistance: Modified independent (Device/Increase time);Min guard Ambulation Distance (Feet): 130 Feet Assistive device: Crutches Gait Pattern/deviations:  (3 point gait pattern on crutches) Gait velocity: decreased   General Gait Details: Pt was CGA for safety within the hospital, but he demonstrated competency with sequencing of his ambulation with his crutches, as well as not  violating his NWB status on the LLE. Pt stated fatigue towards the end of ambulation.   Stairs            Wheelchair Mobility    Modified Rankin (Stroke Patients Only)       Balance Overall balance assessment: No apparent balance deficits (not formally assessed)                                           Pertinent Vitals/Pain Pain Assessment: No/denies pain    Home Living Family/patient expects to be discharged to:: Private residence Living Arrangements: Children Available Help at Discharge: Family Type of Home: House Home Access: Level entry     Home Layout: One level Home Equipment: None      Prior Function Level of Independence: Independent         Comments: Able to complete all ADLs, occupation and driving independently      Hand Dominance        Extremity/Trunk Assessment   Upper Extremity Assessment: Overall WFL for tasks assessed           Lower Extremity Assessment: Overall WFL for tasks assessed;LLE deficits/detail (RLE WFL)   LLE Deficits / Details: NWB, not able to assess strength. Pt states no pain.      Communication   Communication: No difficulties  Cognition Arousal/Alertness: Awake/alert Behavior During Therapy: WFL for tasks assessed/performed Overall Cognitive Status: Within Functional Limits for tasks assessed  General Comments General comments (skin integrity, edema, etc.): No c/o pain or concern for wound site by pt.     Exercises        Assessment/Plan    PT Assessment Patient needs continued PT services  PT Diagnosis Difficulty walking;Abnormality of gait   PT Problem List Decreased strength;Decreased activity tolerance;Decreased mobility;Decreased knowledge of use of DME  PT Treatment Interventions DME instruction;Gait training;Stair training;Functional mobility training;Therapeutic activities;Therapeutic exercise;Balance training;Neuromuscular re-education   PT  Goals (Current goals can be found in the Care Plan section) Acute Rehab PT Goals Patient Stated Goal: To return home today PT Goal Formulation: With patient Time For Goal Achievement: 04/21/15 Potential to Achieve Goals: Good    Frequency 7X/week   Barriers to discharge        Co-evaluation               End of Session Equipment Utilized During Treatment: Gait belt Activity Tolerance: Patient tolerated treatment well;No increased pain Patient left: in chair;with chair alarm set;with nursing/sitter in room;with call bell/phone within reach Nurse Communication: Mobility status;Weight bearing status         Time: 1610-9604 PT Time Calculation (min) (ACUTE ONLY): 27 min   Charges:         PT G CodesBenna Dunks 01-May-2015, 11:11 AM  Benna Dunks, SPT. [phone]

## 2015-04-07 NOTE — Anesthesia Preprocedure Evaluation (Signed)
Anesthesia Evaluation  Patient identified by MRN, date of birth, ID band Patient awake    Reviewed: Allergy & Precautions, NPO status , Patient's Chart, lab work & pertinent test results  History of Anesthesia Complications Negative for: history of anesthetic complications  Airway Mallampati: II  TM Distance: >3 FB Neck ROM: Full    Dental no notable dental hx. (+) Chipped,    Pulmonary neg pulmonary ROS,  breath sounds clear to auscultation  Pulmonary exam normal       Cardiovascular Exercise Tolerance: Good hypertension, Pt. on medications Normal cardiovascular examRhythm:Regular Rate:Normal     Neuro/Psych negative neurological ROS  negative psych ROS   GI/Hepatic Neg liver ROS, GERD-  Medicated,  Endo/Other  diabetes, Type 2, Insulin Dependent  Renal/GU CRFRenal disease  negative genitourinary   Musculoskeletal negative musculoskeletal ROS (+)   Abdominal   Peds negative pediatric ROS (+)  Hematology negative hematology ROS (+)   Anesthesia Other Findings   Reproductive/Obstetrics negative OB ROS                             Anesthesia Physical Anesthesia Plan  ASA: III  Anesthesia Plan: General   Post-op Pain Management:    Induction: Intravenous  Airway Management Planned: Nasal Cannula  Additional Equipment:   Intra-op Plan:   Post-operative Plan:   Informed Consent: I have reviewed the patients History and Physical, chart, labs and discussed the procedure including the risks, benefits and alternatives for the proposed anesthesia with the patient or authorized representative who has indicated his/her understanding and acceptance.     Plan Discussed with: CRNA and Surgeon  Anesthesia Plan Comments:         Anesthesia Quick Evaluation

## 2015-04-07 NOTE — Evaluation (Signed)
Physical Therapy Evaluation Patient Details Name: Juan Roberson. MRN: [id number] DOB: 1971/04/11 Today's Date: 04/07/2015   History of Present Illness  Pt is a  44 year old man who was admitted to the hospital for cellulitis of the left foot secondary to nail puncture through foot while at work. Pt had IND procedure performed on 04/06/15 and is currently NWB on L.   Clinical Impression  Pt presents with hx of DM, HTN, CKD, and GERD. Examination reveals that pt has fair strength, but decreased endurance. He performs all bed mobility independently and transfers at modified independent with crutches. It is slightly easier for pt to perform transfers with [facility] elevated. Pt's decreased endurance as well as NWB status indicates that pt would benefit from skilled PT for increased endurance and prevention of atrophy secondary to NWB. Pt agreed with this recommendation.     Follow Up Recommendations Outpatient PT (Gait training with crutches. Atrophy prevention. )    Equipment Recommendations  Crutches    Recommendations for Other Services       Precautions / Restrictions Precautions Precautions: Fall Restrictions Weight Bearing Restrictions: Yes LLE Weight Bearing: Non weight bearing      Mobility  Bed Mobility Overal bed mobility: Independent             General bed mobility comments: Needs no assistance  Transfers Overall transfer level: Modified independent Equipment used: Crutches             General transfer comment: Pt transfers with modified indep and is able to manage his crutches while maintaining his NWB status  Ambulation/Gait Ambulation/Gait assistance: Modified independent (Device/Increase time);Min guard Ambulation Distance (Feet): 130 Feet Assistive device: Crutches Gait Pattern/deviations:  (3 point gait pattern on crutches) Gait velocity: decreased   General Gait Details: Pt was CGA for safety within the hospital, but he demonstrated competency with  sequencing of his ambulation with his crutches, as well as not violating his NWB status on the LLE. Pt stated fatigue towards the end of ambulation.   Stairs            Wheelchair Mobility    Modified Rankin (Stroke Patients Only)       Balance Overall balance assessment: No apparent balance deficits (not formally assessed)                                           Pertinent Vitals/Pain Pain Assessment: No/denies pain    Home Living Family/patient expects to be discharged to:: Private residence Living Arrangements: Children Available Help at Discharge: Family Type of Home: House Home Access: Level entry     Home Layout: One level Home Equipment: None      Prior Function Level of Independence: Independent         Comments: Able to complete all ADLs, occupation and driving independently      Hand Dominance        Extremity/Trunk Assessment   Upper Extremity Assessment: Overall WFL for tasks assessed           Lower Extremity Assessment: Overall WFL for tasks assessed;LLE deficits/detail (RLE WFL)   LLE Deficits / Details: NWB, not able to assess strength. Pt states no pain.      Communication   Communication: No difficulties  Cognition Arousal/Alertness: Awake/alert Behavior During Therapy: WFL for tasks assessed/performed Overall Cognitive Status: Within Functional Limits for tasks assessed  General Comments General comments (skin integrity, edema, etc.): No c/o pain or concern for wound site by pt.     Exercises        Assessment/Plan    PT Assessment Patient needs continued PT services  PT Diagnosis Difficulty walking;Abnormality of gait   PT Problem List Decreased strength;Decreased activity tolerance;Decreased mobility;Decreased knowledge of use of DME  PT Treatment Interventions DME instruction;Gait training;Stair training;Functional mobility training;Therapeutic  activities;Therapeutic exercise;Balance training;Neuromuscular re-education   PT Goals (Current goals can be found in the Care Plan section) Acute Rehab PT Goals Patient Stated Goal: To return home today PT Goal Formulation: With patient Time For Goal Achievement: 04/21/15 Potential to Achieve Goals: Good    Frequency 7X/week   Barriers to discharge        Co-evaluation               End of Session Equipment Utilized During Treatment: Gait belt Activity Tolerance: Patient tolerated treatment well;No increased pain Patient left: in chair;with chair alarm set;with nursing/sitter in room;with call bell/phone within reach Nurse Communication: Mobility status;Weight bearing status         Time: [id number] PT Time Calculation (min) (ACUTE ONLY): 27 min   Charges:         PT G CodesBenna Dunks:        Ayauna Mcnay 04/07/2015, 11:30 AM  Benna Dunksasey Malia Corsi, SPT. [phone]

## 2015-04-07 NOTE — Evaluation (Signed)
Physical Therapy Evaluation Patient Details Name: Juan BlossomWaltzie L Lagrange Jr. MRN: [id number] DOB: 12/14/1970 Today's Date: 04/07/2015   History of Present Illness  Pt is a  44 year old man who was admitted to the hospital for cellulitis of the left foot secondary to nail puncture through foot while at work. Pt had I&D procedure on 04/06/15 and is currently NWB on L   Clinical Impression  Pt presents with hx of DM, HTN, CKD, and GERD. Examination reveals that pt has fair strength, but decreased endurance. He performs all bed mobility independently and transfers at modified independent with crutches. It is slightly easier for pt to perform transfers with [facility]. Pt's decreased endurance as well as NWB status indicates that pt would benefit from skilled PT for increased endurance and prevention of atrophy secondary to NWB. Pt agreed with this recommendation.     Follow Up Recommendations Outpatient PT (Gait training with crutches. Atrophy prevention. )    Equipment Recommendations  Crutches    Recommendations for Other Services       Precautions / Restrictions Precautions Precautions: Fall Restrictions Weight Bearing Restrictions: Yes LLE Weight Bearing: Non weight bearing      Mobility  Bed Mobility Overal bed mobility: Independent             General bed mobility comments: Needs no assistance  Transfers Overall transfer level: Modified independent Equipment used: Crutches             General transfer comment: Pt transfers with modified indep and is able to manage his crutches while maintaining his NWB status  Ambulation/Gait Ambulation/Gait assistance: Modified independent (Device/Increase time);Min guard Ambulation Distance (Feet): 130 Feet Assistive device: Crutches Gait Pattern/deviations:  (3 point gait pattern on crutches) Gait velocity: decreased   General Gait Details: Pt was CGA for safety within the hospital, but he demonstrated competency with sequencing  of his ambulation with his crutches, as well as not violating his NWB status on the LLE following gait training with PT x10 min, which included crutch positioning, proper fit and sequencing of crutches with 3 point gait pattern. Pt stated fatigue towards the end of ambulation.   Stairs            Wheelchair Mobility    Modified Rankin (Stroke Patients Only)       Balance Overall balance assessment: No apparent balance deficits (not formally assessed)                                           Pertinent Vitals/Pain Pain Assessment: No/denies pain    Home Living Family/patient expects to be discharged to:: Private residence Living Arrangements: Children Available Help at Discharge: Family Type of Home: House Home Access: Level entry     Home Layout: One level Home Equipment: None      Prior Function Level of Independence: Independent         Comments: Able to complete all ADLs, occupation and driving independently      Hand Dominance        Extremity/Trunk Assessment   Upper Extremity Assessment: Overall WFL for tasks assessed           Lower Extremity Assessment: Overall WFL for tasks assessed;LLE deficits/detail (RLE WFL)   LLE Deficits / Details: NWB, not able to assess strength. Pt states no pain.      Communication   Communication: No  difficulties  Cognition Arousal/Alertness: Awake/alert Behavior During Therapy: WFL for tasks assessed/performed Overall Cognitive Status: Within Functional Limits for tasks assessed                      General Comments General comments (skin integrity, edema, etc.): No c/o pain or concern for wound site by pt.     Exercises        Assessment/Plan    PT Assessment Patient needs continued PT services  PT Diagnosis Difficulty walking;Abnormality of gait   PT Problem List Decreased strength;Decreased activity tolerance;Decreased mobility;Decreased knowledge of use of DME  PT  Treatment Interventions DME instruction;Gait training;Stair training;Functional mobility training;Therapeutic activities;Therapeutic exercise;Balance training;Neuromuscular re-education   PT Goals (Current goals can be found in the Care Plan section) Acute Rehab PT Goals Patient Stated Goal: To return home today PT Goal Formulation: With patient Time For Goal Achievement: 04/21/15 Potential to Achieve Goals: Good    Frequency 7X/week   Barriers to discharge        Co-evaluation               End of Session Equipment Utilized During Treatment: Gait belt Activity Tolerance: Patient tolerated treatment well;No increased pain Patient left: in chair;with chair alarm set;with nursing/sitter in room;with call bell/phone within reach Nurse Communication: Mobility status;Weight bearing status         Time: 6213-0865 PT Time Calculation (min) (ACUTE ONLY): 27 min   Charges:   PT Evaluation $Initial PT Evaluation Tier I: 1 Procedure PT Treatments $Gait Training: 8-22 mins   PT G CodesBenna Dunks 04-26-15, 11:50 AM  Benna Dunks, SPT. [phone]

## 2015-04-07 NOTE — Progress Notes (Signed)
Daily Progress Note   Subjective  - 1 Day Post-Op  Doing well.  No pain.  Objective Filed Vitals:   04/07/15 0510 04/07/15 0512 04/07/15 0724 04/07/15 1117  BP: 166/88  159/79 147/74  Pulse: 104  98 85  Temp: 98.6 F (37 C)  98.6 F (37 C) 98.2 F (36.8 C)  TempSrc: Oral  Oral Oral  Resp: 18  14   Height:      Weight:  97.705 kg (215 lb 6.4 oz)    SpO2: 100%  99% 98%    Physical Exam: Plantar wound is packed open without purulence.  No drainage noted with compression.  Still residual dorsal foot erythema.  No lymphangitis.  Laboratory CBC    Component Value Date/Time   WBC 9.5 04/07/2015 0924   WBC 5.7 07/27/2014 0445   HGB 8.8* 04/07/2015 0924   HGB 8.6* 07/27/2014 0445   HCT 27.8* 04/07/2015 0924   HCT 27.3* 07/27/2014 0445   PLT 208 04/07/2015 0924   PLT 164 07/27/2014 0445    BMET    Component Value Date/Time   NA 141 04/07/2015 0924   NA 140 09/18/2014 1003   K 4.1 04/07/2015 0924   K 5.6* 09/18/2014 1003   CL 106 04/07/2015 0924   CL 111* 09/18/2014 1003   CO2 22 04/07/2015 0924   CO2 22 09/18/2014 1003   GLUCOSE 218* 04/07/2015 0924   GLUCOSE 270* 09/18/2014 1003   BUN 53* 04/07/2015 0924   BUN 52* 09/18/2014 1003   CREATININE 2.37* 04/07/2015 0924   CREATININE 2.50* 09/18/2014 1003   CALCIUM 9.3 04/07/2015 0924   CALCIUM 9.0 09/18/2014 1003   GFRNONAA 31* 04/07/2015 0924   GFRNONAA 38* 03/31/2014 2147   GFRAA 36* 04/07/2015 0924   GFRAA 44* 03/31/2014 2147   Results for orders placed or performed during the hospital encounter of 04/05/15  Culture, blood (routine x 2)     Status: None (Preliminary result)   Collection Time: 04/05/15  4:38 AM  Result Value Ref Range Status   Specimen Description BLOOD LEFT FATTY CASTS UPPER  Final   Special Requests BOTTLES DRAWN AEROBIC AND ANAEROBIC 7CC  Final   Culture NO GROWTH 1 DAY  Final   Report Status PENDING  Incomplete  Culture, blood (routine x 2)     Status: None (Preliminary result)   Collection Time: 04/05/15  4:41 AM  Result Value Ref Range Status   Specimen Description BLOOD LEFT FATTY CASTS LOWER  Final   Special Requests BOTTLES DRAWN AEROBIC AND ANAEROBIC 7CC  Final   Culture NO GROWTH 1 DAY  Final   Report Status PENDING  Incomplete  Wound culture     Status: None (Preliminary result)   Collection Time: 04/05/15  4:55 PM  Result Value Ref Range Status   Specimen Description FOOT  Final   Special Requests Immunocompromised  Final   Gram Stain   Final    FEW WBC SEEN MODERATE GRAM POSITIVE COCCI IN PAIRS IN CLUSTERS FEW GRAM NEGATIVE RODS FEW GRAM VARIABLE ROD    Culture   Final    HEAVY GROWTH STAPHYLOCOCCUS AUREUS LIGHT GROWTH KLEBSIELLA PNEUMONIAE    Report Status PENDING  Incomplete   Organism ID, Bacteria STAPHYLOCOCCUS AUREUS  Final   Organism ID, Bacteria KLEBSIELLA PNEUMONIAE  Final      Susceptibility   Staphylococcus aureus - MIC*    CIPROFLOXACIN <=0.5 SENSITIVE Sensitive     GENTAMICIN <=0.5 SENSITIVE Sensitive     OXACILLIN <=0.25  SENSITIVE Sensitive     TRIMETH/SULFA <=10 SENSITIVE Sensitive     CEFOXITIN SCREEN NEGATIVE Sensitive     Inducible Clindamycin NEGATIVE Sensitive     TETRACYCLINE Value in next row Sensitive      SENSITIVE<=1    CLINDAMYCIN Value in next row Sensitive      SENSITIVE<=0.25    LEVOFLOXACIN Value in next row Sensitive      SENSITIVE<=0.12    * HEAVY GROWTH STAPHYLOCOCCUS AUREUS   Klebsiella pneumoniae - MIC*    AMPICILLIN Value in next row Resistant      SENSITIVE<=0.12    CEFAZOLIN Value in next row Sensitive      SENSITIVE<=0.12    CEFTRIAXONE Value in next row Sensitive      SENSITIVE<=0.12    CIPROFLOXACIN Value in next row Sensitive      SENSITIVE<=0.12    GENTAMICIN Value in next row Sensitive      SENSITIVE<=0.12    IMIPENEM Value in next row Sensitive      SENSITIVE<=0.12    NITROFURANTOIN Value in next row Intermediate      SENSITIVE<=0.12    TRIMETH/SULFA Value in next row Sensitive       SENSITIVE<=0.12    CEFOXITIN Value in next row Sensitive      SENSITIVE<=0.12    * LIGHT GROWTH KLEBSIELLA PNEUMONIAE  Surgical PCR screen     Status: None   Collection Time: 04/06/15  7:25 AM  Result Value Ref Range Status   MRSA, PCR NEGATIVE NEGATIVE Final   Staphylococcus aureus NEGATIVE NEGATIVE Final    Comment:        The Xpert SA Assay (FDA approved for NASAL specimens in patients over 46 years of age), is one component of a comprehensive surveillance program.  Test performance has been validated by [facility] for patients greater than or equal to 46 year old. It is not intended to diagnose infection nor to guide or monitor treatment.   Wound culture     Status: None (Preliminary result)   Collection Time: 04/06/15 12:40 PM  Result Value Ref Range Status   Specimen Description FOOT  Final   Special Requests NONE  Final   Gram Stain   Final    FEW WBC SEEN MANY GRAM NEGATIVE RODS MANY GRAM POSITIVE COCCI IN PAIRS IN CLUSTERS    Culture   Final    HEAVY GROWTH STAPHYLOCOCCUS AUREUS SUSCEPTIBILITIES TO FOLLOW    Report Status PENDING  Incomplete    Assessment/Planning: Puncture wound with abscess s/p I & D   Growing Staph.  Awaiting sensitivities.  Dressing changed today,.  C/W NWB.    Will change dressing tomorrow.  Suspect can d/c home on po antibiotics once sensitivities return.  Gwyneth RevelsFowler, Yossef Gilkison A  04/07/2015, 4:18 PM

## 2015-04-08 LAB — WOUND CULTURE

## 2015-04-08 LAB — GLUCOSE, CAPILLARY: Glucose-Capillary: 134 mg/dL — ABNORMAL HIGH (ref 65–99)

## 2015-04-08 MED ORDER — CIPROFLOXACIN HCL 500 MG PO TABS: 500.0000 mg | ORAL_TABLET | Freq: Two times a day (BID) | ORAL | Status: DC

## 2015-04-08 MED ORDER — PANTOPRAZOLE SODIUM 40 MG PO TBEC: 40.0000 mg | DELAYED_RELEASE_TABLET | Freq: Every day | ORAL | Status: DC

## 2015-04-08 MED ORDER — AMLODIPINE BESYLATE 10 MG PO TABS: 10.0000 mg | ORAL_TABLET | Freq: Every day | ORAL | Status: DC

## 2015-04-08 NOTE — Progress Notes (Signed)
PT Cancellation Note  Patient Details Name: Juan Roberson. MRN: [id number] DOB: 12/16/1970   Cancelled Treatment:    Reason Eval/Treat Not Completed: Other (comment) (See PT note for further details) Pt had endoscopy procedure performed under general anesthesia, requiring PT to acquire new orders. Will continue PT once orders are issued.    Benna DunksCasey Tonyia Marschall 04/08/2015, 7:27 AM  Benna Dunksasey Aikam Vinje, SPT. [phone]

## 2015-04-08 NOTE — Discharge Summary (Signed)
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 Gastritis, duodenitis and esophagitis.  SECONDARY DIAGNOSIS:   Past Medical History  Diagnosis Date  . Diabetes mellitus without complication   . Hypertension   . Chronic kidney disease   . GERD (gastroesophageal reflux disease)   . Cellulitis and abscess of foot     Left-Dr. Alfonse FlavorsFowler    HOSPITAL COURSE:   1. Left foot cellulitis with abcess:  The patient has been treated with vancomycin and zosyn. He got I&D 2 days ago.  Blood cultures negative. Wound culture: Klebsiella pneumonia and staph aureus, both are sensitive to Cipro. He will get Cipro 500 mg by mouth twice a day for 10 more days. He needs to follow up with Dr. Ether GriffinsFowler as outpatient.  2. 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 His renal function is improving this IVF.  * Hyponatremia. improved with normal saline IV.  3. Diabetes mellitus type 2: He has been treated with sliding scale insulin and lantus 16 units SQ daily.  4. HTN. He has been treated with clonidine and norvasc with Iv hydralazine prn.  GIB: He had bloody emesis x 3. He was treated with protonix drip. EGD yesterday showed Gastritis, duodenitis and esophagitis but no active bleeding. He will get Protonix twice a day.  Anemia of chronic disease. Hb is stable.  DISCHARGE CONDITIONS:   Stable.  CONSULTS OBTAINED:  Treatment Team:  Gwyneth RevelsJustin Fowler, DPM Midge Miniumarren Wohl, MD  DRUG ALLERGIES:   Allergies  Allergen Reactions  . Sulfur Other (See Comments)    Renal failure    DISCHARGE MEDICATIONS:   Discharge Medication List  as of 04/08/2015 10:35 AM    START taking these medications   Details  amLODipine (NORVASC) 10 MG tablet Take 1 tablet (10 mg total) by mouth daily., Starting 04/08/2015, Until Discontinued, Print    ciprofloxacin (CIPRO) 500 MG tablet Take 1 tablet (500 mg total) by mouth 2 (two) times daily., Starting 04/08/2015, Until Discontinued, Print    pantoprazole (PROTONIX) 40 MG tablet Take 1 tablet (40 mg total) by mouth daily., Starting 04/08/2015, Until Discontinued, Print      CONTINUE these medications which have NOT CHANGED   Details  cloNIDine (CATAPRES) 0.1 MG tablet Take 0.1 mg by mouth 2 (two) times daily., Until Discontinued, Historical Med    furosemide (LASIX) 40 MG tablet Take 40 mg by mouth daily., Until Discontinued, Historical Med    oxyCODONE-acetaminophen (ROXICET) 5-325 MG per tablet Take 1-2 tablets by mouth every 6 (six) hours as needed for severe pain., Starting 04/02/2015, Until Discontinued, Print    patiromer Lelon Perla(VELTASSA) 8.4 G packet Take 8.4 g by mouth daily., Until Discontinued, Historical Med      STOP taking these medications     amoxicillin-clavulanate (AUGMENTIN) 875-125 MG per tablet          DISCHARGE INSTRUCTIONS:    If you experience worsening of your admission symptoms, develop shortness of breath, life threatening emergency, suicidal or homicidal thoughts you must seek medical attention immediately by calling 911 or calling your MD immediately  if symptoms less  severe.  You Must read complete instructions/literature along with all the possible adverse reactions/side effects for all the Medicines you take and that have been prescribed to you. Take any new Medicines after you have completely understood and accept all the possible adverse reactions/side effects.   Please note  You were cared for by a hospitalist during your hospital stay. If you have any questions about your discharge medications or the care you received while you were in the hospital after  you are discharged, you can call the unit and asked to speak with the hospitalist on call if the hospitalist that took care of you is not available. Once you are discharged, your primary care physician will handle any further medical issues. Please note that NO REFILLS for any discharge medications will be authorized once you are discharged, as it is imperative that you return to your primary care physician (or establish a relationship with a primary care physician if you do not have one) for your aftercare needs so that they can reassess your need for medications and monitor your lab values.    Today   SUBJECTIVE   No complaint   VITAL SIGNS:  Blood pressure 163/93, pulse 116, temperature 98.6 F (37 C), temperature source Oral, resp. rate 18, height 6\' 2"  (1.88 m), weight 97.705 kg (215 lb 6.4 oz), SpO2 98 %.  I/O:   Intake/Output Summary (Last 24 hours) at 04/08/15 1333 Last data filed at 04/08/15 0600  Gross per 24 hour  Intake 1098.75 ml  Output    500 ml  Net 598.75 ml    PHYSICAL EXAMINATION:  GENERAL:  44 y.o.-year-old patient lying in the bed with no acute distress.  EYES: Pupils equal, round, reactive to light and accommodation. No scleral icterus. Extraocular muscles intact.  HEENT: Head atraumatic, normocephalic. Oropharynx and nasopharynx clear.  NECK:  Supple, no jugular venous distention. No thyroid enlargement, no tenderness.  LUNGS: Normal breath sounds bilaterally, no wheezing, rales,rhonchi or crepitation. No use of accessory muscles of respiration.  CARDIOVASCULAR: S1, S2 normal. No murmurs, rubs, or gallops.  ABDOMEN: Soft, non-tender, non-distended. Bowel sounds present. No organomegaly or mass.  EXTREMITIES: No pedal edema, cyanosis, or clubbing. Left foot in dressing. NEUROLOGIC: Cranial nerves II through XII are intact. Muscle strength 5/5 in all extremities. Sensation intact. Gait not checked.  PSYCHIATRIC: The patient is alert and oriented x 3.  SKIN: No  obvious rash, lesion, or ulcer.   DATA REVIEW:   CBC  Recent Labs Lab 04/07/15 0924  WBC 9.5  HGB 8.8*  HCT 27.8*  PLT 208    Chemistries   Recent Labs Lab 04/07/15 0924  NA 141  K 4.1  CL 106  CO2 22  GLUCOSE 218*  BUN 53*  CREATININE 2.37*  CALCIUM 9.3    Cardiac Enzymes No results for input(s): TROPONINI in the last 168 hours.  Microbiology Results  Results for orders placed or performed during the hospital encounter of 04/05/15  Culture, blood (routine x 2)     Status: None (Preliminary result)   Collection Time: 04/05/15  4:38 AM  Result Value Ref Range Status   Specimen Description BLOOD LEFT FATTY CASTS UPPER  Final   Special Requests BOTTLES DRAWN AEROBIC AND ANAEROBIC 7CC  Final   Culture NO GROWTH 3 DAYS  Final   Report Status PENDING  Incomplete  Culture, blood (routine x 2)     Status: None (Preliminary result)   Collection Time: 04/05/15  4:41 AM  Result  Value Ref Range Status   Specimen Description BLOOD LEFT FATTY CASTS LOWER  Final   Special Requests BOTTLES DRAWN AEROBIC AND ANAEROBIC 7CC  Final   Culture NO GROWTH 3 DAYS  Final   Report Status PENDING  Incomplete  Wound culture     Status: None   Collection Time: 04/05/15  4:55 PM  Result Value Ref Range Status   Specimen Description FOOT  Final   Special Requests Immunocompromised  Final   Gram Stain   Final    FEW WBC SEEN MODERATE GRAM POSITIVE COCCI IN PAIRS IN CLUSTERS FEW GRAM NEGATIVE RODS FEW GRAM VARIABLE ROD    Culture   Final    HEAVY GROWTH STAPHYLOCOCCUS AUREUS LIGHT GROWTH KLEBSIELLA PNEUMONIAE    Report Status 04/08/2015 FINAL  Final   Organism ID, Bacteria STAPHYLOCOCCUS AUREUS  Final   Organism ID, Bacteria KLEBSIELLA PNEUMONIAE  Final      Susceptibility   Staphylococcus aureus - MIC*    CIPROFLOXACIN <=0.5 SENSITIVE Sensitive     GENTAMICIN <=0.5 SENSITIVE Sensitive     OXACILLIN <=0.25 SENSITIVE Sensitive     TRIMETH/SULFA <=10 SENSITIVE Sensitive      CEFOXITIN SCREEN NEGATIVE Sensitive     Inducible Clindamycin NEGATIVE Sensitive     TETRACYCLINE Value in next row Sensitive      SENSITIVE<=1    CLINDAMYCIN Value in next row Sensitive      SENSITIVE<=0.25    LEVOFLOXACIN Value in next row Sensitive      SENSITIVE<=0.12    * HEAVY GROWTH STAPHYLOCOCCUS AUREUS   Klebsiella pneumoniae - MIC*    AMPICILLIN Value in next row Resistant      SENSITIVE<=0.12    CEFAZOLIN Value in next row Sensitive      SENSITIVE<=0.12    CEFTRIAXONE Value in next row Sensitive      SENSITIVE<=0.12    CIPROFLOXACIN Value in next row Sensitive      SENSITIVE<=0.12    GENTAMICIN Value in next row Sensitive      SENSITIVE<=0.12    IMIPENEM Value in next row Sensitive      SENSITIVE<=0.12    NITROFURANTOIN Value in next row Intermediate      SENSITIVE<=0.12    TRIMETH/SULFA Value in next row Sensitive      SENSITIVE<=0.12    CEFOXITIN Value in next row Sensitive      SENSITIVE<=0.12    * LIGHT GROWTH KLEBSIELLA PNEUMONIAE  Surgical PCR screen     Status: None   Collection Time: 04/06/15  7:25 AM  Result Value Ref Range Status   MRSA, PCR NEGATIVE NEGATIVE Final   Staphylococcus aureus NEGATIVE NEGATIVE Final    Comment:        The Xpert SA Assay (FDA approved for NASAL specimens in patients over 47 years of age), is one component of a comprehensive surveillance program.  Test performance has been validated by [facility] for patients greater than or equal to 40 year old. It is not intended to diagnose infection nor to guide or monitor treatment.   Anaerobic culture     Status: None (Preliminary result)   Collection Time: 04/06/15 12:40 PM  Result Value Ref Range Status   Specimen Description FOOT  Final   Special Requests NONE  Final   Culture HOLDING FOR POSSIBLE ANAEROBE  Final   Report Status PENDING  Incomplete  Wound culture     Status: None (Preliminary result)   Collection Time: 04/06/15 12:40 PM  Result Value Ref Range  Status    Specimen Description FOOT  Final   Special Requests NONE  Final   Gram Stain   Final    FEW WBC SEEN MANY GRAM NEGATIVE RODS MANY GRAM POSITIVE COCCI IN PAIRS IN CLUSTERS    Culture HEAVY GROWTH STAPHYLOCOCCUS AUREUS  Final   Report Status PENDING  Incomplete   Organism ID, Bacteria STAPHYLOCOCCUS AUREUS  Final      Susceptibility   Staphylococcus aureus - MIC*    CIPROFLOXACIN <=0.5 SENSITIVE Sensitive     GENTAMICIN <=0.5 SENSITIVE Sensitive     OXACILLIN 0.5 SENSITIVE Sensitive     TRIMETH/SULFA <=10 SENSITIVE Sensitive     CEFOXITIN SCREEN NEGATIVE Sensitive     Inducible Clindamycin NEGATIVE Sensitive     TETRACYCLINE Value in next row Sensitive      SENSITIVE<=1    CLINDAMYCIN Value in next row Sensitive      SENSITIVE<=0.25    LEVOFLOXACIN Value in next row Sensitive      SENSITIVE<=0.12    * HEAVY GROWTH STAPHYLOCOCCUS AUREUS    RADIOLOGY:  No results found.      Management plans discussed with the patient, family and they are in agreement.  CODE STATUS:     Code Status Orders        Start     Ordered   04/05/15 0706  Full code   Continuous     04/05/15 0705      TOTAL TIME TAKING CARE OF THIS PATIENT: 36 minutes.    Shaune Pollack M.D on 04/08/2015 at 1:33 PM  Between 7am to 6pm - Pager - [phone]  After 6pm go to [email or web address] - password [email or web address] [facility]  [place] [facility]  Office  [phone]  CC: Primary care physician; Corky Downs, MD

## 2015-04-08 NOTE — Discharge Instructions (Signed)
Low sodium, low fat and ADA diet. °Activity as tolerated. °

## 2015-04-08 NOTE — Care Management (Signed)
Crutches delivered by [facility]. No further RNCM needs. Case closed.

## 2015-04-08 NOTE — Anesthesia Postprocedure Evaluation (Signed)
  Anesthesia Post-op Note  Patient: Juan BlossomWaltzie L Foulk Jr.  Procedure(s) Performed: Procedure(s): IRRIGATION AND DEBRIDEMENT EXTREMITY (Left)  Anesthesia type:General LMA  Patient location: PACU  Post pain: Pain level controlled  Post assessment: Post-op Vital signs reviewed, Patient's Cardiovascular Status Stable, Respiratory Function Stable, Patent Airway and No signs of Nausea or vomiting  Post vital signs: Reviewed and stable  Last Vitals:  Filed Vitals:   04/08/15 0731  BP: 163/93  Pulse: 116  Temp: 37 C  Resp:     Level of consciousness: awake, alert  and patient cooperative  Complications: No apparent anesthesia complications

## 2015-04-09 ENCOUNTER — Encounter: Payer: Self-pay | Admitting: Gastroenterology

## 2015-04-09 LAB — GLUCOSE, CAPILLARY
Glucose-Capillary: 137 mg/dL — ABNORMAL HIGH (ref 65–99)
Glucose-Capillary: 101 mg/dL — ABNORMAL HIGH (ref 65–99)

## 2015-04-10 LAB — CULTURE, BLOOD (ROUTINE X 2)
Culture: NO GROWTH
Culture: NO GROWTH

## 2015-04-11 LAB — WOUND CULTURE

## 2015-04-12 ENCOUNTER — Telehealth: Payer: Self-pay | Admitting: Gastroenterology

## 2015-04-12 NOTE — Telephone Encounter (Signed)
Please okay for me to call in Zofran. Pt is only allergic to Sulfa drugs.

## 2015-04-12 NOTE — Telephone Encounter (Signed)
Pt had endo last Wednesday and has been throwing up ever since. Can you call him in a rx for that.

## 2015-04-13 ENCOUNTER — Other Ambulatory Visit: Payer: Self-pay

## 2015-04-13 DIAGNOSIS — R112 Nausea with vomiting, unspecified: Secondary | ICD-10-CM

## 2015-04-13 MED ORDER — ONDANSETRON HCL 4 MG PO TABS: 4.0000 mg | ORAL_TABLET | Freq: Three times a day (TID) | ORAL | Status: DC | PRN

## 2015-04-13 NOTE — Telephone Encounter (Signed)
Yes. Please start patient on Zofran.

## 2015-04-13 NOTE — Telephone Encounter (Signed)
Rx for Zofran 4mg  [facility] [address] per pt [facility]. Pt notified this was done.

## 2015-04-16 LAB — ANAEROBIC CULTURE

## 2015-05-13 ENCOUNTER — Other Ambulatory Visit: Payer: Self-pay | Admitting: Podiatry

## 2015-05-13 ENCOUNTER — Ambulatory Visit
Admission: RE | Admit: 2015-05-13 | Discharge: 2015-05-13 | Disposition: A | Discharge status: Home / Self Care | Payer: Worker's Compensation | Source: Ambulatory Visit | Attending: Podiatry | Admitting: Podiatry

## 2015-05-13 DIAGNOSIS — M79605 Pain in left leg: Secondary | ICD-10-CM

## 2015-05-13 DIAGNOSIS — I82402 Acute embolism and thrombosis of unspecified deep veins of left lower extremity: Secondary | ICD-10-CM | POA: Diagnosis not present

## 2015-05-13 DIAGNOSIS — I808 Phlebitis and thrombophlebitis of other sites: Secondary | ICD-10-CM | POA: Diagnosis not present

## 2015-05-13 DIAGNOSIS — I82890 Acute embolism and thrombosis of other specified veins: Secondary | ICD-10-CM | POA: Insufficient documentation

## 2015-05-13 DIAGNOSIS — M7989 Other specified soft tissue disorders: Secondary | ICD-10-CM | POA: Diagnosis present

## 2015-05-18 ENCOUNTER — Encounter: Payer: Self-pay | Admitting: *Deleted

## 2015-05-18 ENCOUNTER — Inpatient Hospital Stay
Admission: EM | Admit: 2015-05-18 | Discharge: 2015-05-25 | DRG: 504 | Disposition: A | Discharge status: Home Health | Payer: Worker's Compensation | Attending: Internal Medicine | Admitting: Internal Medicine

## 2015-05-18 ENCOUNTER — Emergency Department: Payer: Worker's Compensation

## 2015-05-18 DIAGNOSIS — E1140 Type 2 diabetes mellitus with diabetic neuropathy, unspecified: Secondary | ICD-10-CM | POA: Diagnosis present

## 2015-05-18 DIAGNOSIS — E119 Type 2 diabetes mellitus without complications: Secondary | ICD-10-CM

## 2015-05-18 DIAGNOSIS — R110 Nausea: Secondary | ICD-10-CM | POA: Diagnosis not present

## 2015-05-18 DIAGNOSIS — I82402 Acute embolism and thrombosis of unspecified deep veins of left lower extremity: Secondary | ICD-10-CM | POA: Diagnosis present

## 2015-05-18 DIAGNOSIS — Z800 Family history of malignant neoplasm of digestive organs: Secondary | ICD-10-CM | POA: Diagnosis not present

## 2015-05-18 DIAGNOSIS — R748 Abnormal levels of other serum enzymes: Secondary | ICD-10-CM | POA: Diagnosis present

## 2015-05-18 DIAGNOSIS — Z803 Family history of malignant neoplasm of breast: Secondary | ICD-10-CM

## 2015-05-18 DIAGNOSIS — Z8249 Family history of ischemic heart disease and other diseases of the circulatory system: Secondary | ICD-10-CM

## 2015-05-18 DIAGNOSIS — I129 Hypertensive chronic kidney disease with stage 1 through stage 4 chronic kidney disease, or unspecified chronic kidney disease: Secondary | ICD-10-CM | POA: Diagnosis present

## 2015-05-18 DIAGNOSIS — I429 Cardiomyopathy, unspecified: Secondary | ICD-10-CM | POA: Diagnosis not present

## 2015-05-18 DIAGNOSIS — K2970 Gastritis, unspecified, without bleeding: Secondary | ICD-10-CM | POA: Diagnosis not present

## 2015-05-18 DIAGNOSIS — R931 Abnormal findings on diagnostic imaging of heart and coronary circulation: Secondary | ICD-10-CM | POA: Diagnosis not present

## 2015-05-18 DIAGNOSIS — W450XXS Nail entering through skin, sequela: Secondary | ICD-10-CM | POA: Diagnosis present

## 2015-05-18 DIAGNOSIS — S91332S Puncture wound without foreign body, left foot, sequela: Secondary | ICD-10-CM

## 2015-05-18 DIAGNOSIS — M86172 Other acute osteomyelitis, left ankle and foot: Secondary | ICD-10-CM | POA: Diagnosis present

## 2015-05-18 DIAGNOSIS — K920 Hematemesis: Secondary | ICD-10-CM | POA: Diagnosis not present

## 2015-05-18 DIAGNOSIS — Z801 Family history of malignant neoplasm of trachea, bronchus and lung: Secondary | ICD-10-CM

## 2015-05-18 DIAGNOSIS — B999 Unspecified infectious disease: Secondary | ICD-10-CM

## 2015-05-18 DIAGNOSIS — E1122 Type 2 diabetes mellitus with diabetic chronic kidney disease: Secondary | ICD-10-CM | POA: Diagnosis present

## 2015-05-18 DIAGNOSIS — Z7901 Long term (current) use of anticoagulants: Secondary | ICD-10-CM | POA: Diagnosis not present

## 2015-05-18 DIAGNOSIS — L02612 Cutaneous abscess of left foot: Secondary | ICD-10-CM | POA: Diagnosis not present

## 2015-05-18 DIAGNOSIS — L0291 Cutaneous abscess, unspecified: Secondary | ICD-10-CM

## 2015-05-18 DIAGNOSIS — M86179 Other acute osteomyelitis, unspecified ankle and foot: Secondary | ICD-10-CM | POA: Diagnosis present

## 2015-05-18 DIAGNOSIS — I5189 Other ill-defined heart diseases: Secondary | ICD-10-CM | POA: Diagnosis not present

## 2015-05-18 DIAGNOSIS — K210 Gastro-esophageal reflux disease with esophagitis: Secondary | ICD-10-CM | POA: Diagnosis present

## 2015-05-18 DIAGNOSIS — R9431 Abnormal electrocardiogram [ECG] [EKG]: Secondary | ICD-10-CM | POA: Diagnosis present

## 2015-05-18 DIAGNOSIS — M869 Osteomyelitis, unspecified: Secondary | ICD-10-CM

## 2015-05-18 DIAGNOSIS — N179 Acute kidney failure, unspecified: Secondary | ICD-10-CM | POA: Diagnosis present

## 2015-05-18 DIAGNOSIS — L0390 Cellulitis, unspecified: Secondary | ICD-10-CM | POA: Diagnosis present

## 2015-05-18 DIAGNOSIS — N183 Chronic kidney disease, stage 3 (moderate): Secondary | ICD-10-CM | POA: Diagnosis present

## 2015-05-18 HISTORY — DX: Essential (primary) hypertension: I10

## 2015-05-18 HISTORY — DX: Gastritis, unspecified, without bleeding: K29.70

## 2015-05-18 HISTORY — DX: Osteomyelitis, unspecified: M86.9

## 2015-05-18 HISTORY — DX: Acute embolism and thrombosis of unspecified deep veins of left lower extremity: I82.402

## 2015-05-18 HISTORY — DX: Chronic kidney disease, stage 3 (moderate): N18.3

## 2015-05-18 HISTORY — DX: Chronic kidney disease, stage 3 unspecified: N18.30

## 2015-05-18 LAB — BASIC METABOLIC PANEL
ANION GAP: 10 (ref 5–15)
BUN: 35 mg/dL — ABNORMAL HIGH (ref 6–20)
CALCIUM: 9.5 mg/dL (ref 8.9–10.3)
CHLORIDE: 111 mmol/L (ref 101–111)
CO2: 21 mmol/L — ABNORMAL LOW (ref 22–32)
CREATININE: 1.78 mg/dL — AB (ref 0.61–1.24)
GFR calc Af Amer: 51 mL/min — ABNORMAL LOW (ref >60)
GFR calc non Af Amer: 44 mL/min — ABNORMAL LOW (ref >60)
GLUCOSE: 131 mg/dL — AB (ref 65–99)
Potassium: 3.9 mmol/L (ref 3.5–5.1)
Sodium: 142 mmol/L (ref 135–145)

## 2015-05-18 LAB — WOUND CULTURE

## 2015-05-18 LAB — CBC WITH DIFFERENTIAL/PLATELET
BASOS PCT: 0 %
Basophils Absolute: 0 10*3/uL (ref 0–0.1)
Eosinophils Absolute: 0.1 10*3/uL (ref 0–0.7)
Eosinophils Relative: 2 %
HEMATOCRIT: 27 % — AB (ref 40.0–52.0)
Hemoglobin: 8.4 g/dL — ABNORMAL LOW (ref 13.0–18.0)
LYMPHS ABS: 1.1 10*3/uL (ref 1.0–3.6)
Lymphocytes Relative: 20 %
MCH: 25 pg — AB (ref 26.0–34.0)
MCHC: 31.2 g/dL — AB (ref 32.0–36.0)
MCV: 80.2 fL (ref 80.0–100.0)
MONO ABS: 0.4 10*3/uL (ref 0.2–1.0)
Monocytes Relative: 8 %
Neutro Abs: 4 10*3/uL (ref 1.4–6.5)
Neutrophils Relative %: 70 %
Platelets: 165 10*3/uL (ref 150–440)
RBC: 3.37 MIL/uL — ABNORMAL LOW (ref 4.40–5.90)
RDW: 14.5 % (ref 11.5–14.5)
WBC: 5.7 10*3/uL (ref 3.8–10.6)

## 2015-05-18 LAB — GLUCOSE, CAPILLARY: Glucose-Capillary: 111 mg/dL — ABNORMAL HIGH (ref 65–99)

## 2015-05-18 MED ORDER — AMLODIPINE BESYLATE 10 MG PO TABS
10.0000 mg | ORAL_TABLET | Freq: Every day | ORAL | Status: DC
  Administered 2015-05-22 – 2015-05-25 (×4): 10 mg via ORAL
  Filled 2015-05-18 (×8): qty 1

## 2015-05-18 MED ORDER — CLONIDINE HCL 0.1 MG PO TABS
0.2000 mg | ORAL_TABLET | Freq: Every day | ORAL | Status: DC
  Administered 2015-05-18 – 2015-05-25 (×8): 0.2 mg via ORAL
  Filled 2015-05-18 (×9): qty 2

## 2015-05-18 MED ORDER — ACETAMINOPHEN 325 MG PO TABS
650.0000 mg | ORAL_TABLET | Freq: Four times a day (QID) | ORAL | Status: DC | PRN
  Administered 2015-05-19 – 2015-05-23 (×3): 650 mg via ORAL
  Filled 2015-05-18 (×3): qty 2

## 2015-05-18 MED ORDER — MORPHINE SULFATE (PF) 4 MG/ML IV SOLN: 4.0000 mg | INTRAVENOUS | Status: DC | PRN

## 2015-05-18 MED ORDER — VANCOMYCIN HCL IN DEXTROSE 1-5 GM/200ML-% IV SOLN
1000.0000 mg | Freq: Once | INTRAVENOUS | Status: AC
  Administered 2015-05-18: 1000 mg via INTRAVENOUS
  Filled 2015-05-18: qty 200

## 2015-05-18 MED ORDER — ONDANSETRON HCL 4 MG/2ML IJ SOLN
4.0000 mg | Freq: Four times a day (QID) | INTRAMUSCULAR | Status: DC | PRN
  Administered 2015-05-18 – 2015-05-19 (×3): 4 mg via INTRAVENOUS
  Filled 2015-05-18 (×3): qty 2

## 2015-05-18 MED ORDER — CIPROFLOXACIN IN D5W 400 MG/200ML IV SOLN
400.0000 mg | Freq: Once | INTRAVENOUS | Status: AC
  Administered 2015-05-18: 400 mg via INTRAVENOUS
  Filled 2015-05-18: qty 200

## 2015-05-18 MED ORDER — ONDANSETRON HCL 4 MG/2ML IJ SOLN
4.0000 mg | Freq: Once | INTRAMUSCULAR | Status: AC
  Administered 2015-05-18: 4 mg via INTRAVENOUS
  Filled 2015-05-18: qty 2

## 2015-05-18 MED ORDER — ACETAMINOPHEN 650 MG RE SUPP: 650.0000 mg | Freq: Four times a day (QID) | RECTAL | Status: DC | PRN

## 2015-05-18 MED ORDER — CIPROFLOXACIN IN D5W 400 MG/200ML IV SOLN
400.0000 mg | Freq: Two times a day (BID) | INTRAVENOUS | Status: DC
  Administered 2015-05-19 – 2015-05-20 (×3): 400 mg via INTRAVENOUS
  Filled 2015-05-18 (×5): qty 200

## 2015-05-18 MED ORDER — PREGABALIN 75 MG PO CAPS
75.0000 mg | ORAL_CAPSULE | Freq: Three times a day (TID) | ORAL | Status: DC
  Administered 2015-05-18: 75 mg via ORAL
  Filled 2015-05-18 (×2): qty 1

## 2015-05-18 MED ORDER — HYDRALAZINE HCL 20 MG/ML IJ SOLN
10.0000 mg | INTRAMUSCULAR | Status: DC | PRN
  Administered 2015-05-18: 10 mg via INTRAVENOUS
  Filled 2015-05-18: qty 1

## 2015-05-18 MED ORDER — HYDRALAZINE HCL 50 MG PO TABS
100.0000 mg | ORAL_TABLET | Freq: Three times a day (TID) | ORAL | Status: DC
  Administered 2015-05-19 – 2015-05-22 (×6): 100 mg via ORAL
  Filled 2015-05-18 (×10): qty 2

## 2015-05-18 MED ORDER — INSULIN GLARGINE 100 UNIT/ML ~~LOC~~ SOLN
16.0000 [IU] | Freq: Every day | SUBCUTANEOUS | Status: DC
  Administered 2015-05-18 – 2015-05-24 (×7): 16 [IU] via SUBCUTANEOUS
  Filled 2015-05-18 (×9): qty 0.16

## 2015-05-18 MED ORDER — ACETAMINOPHEN 325 MG PO TABS
650.0000 mg | ORAL_TABLET | Freq: Once | ORAL | Status: AC
  Administered 2015-05-18: 650 mg via ORAL
  Filled 2015-05-18: qty 2

## 2015-05-18 MED ORDER — INSULIN ASPART 100 UNIT/ML ~~LOC~~ SOLN
0.0000 [IU] | Freq: Three times a day (TID) | SUBCUTANEOUS | Status: DC
  Administered 2015-05-20 (×2): 2 [IU] via SUBCUTANEOUS
  Administered 2015-05-22: 5 [IU] via SUBCUTANEOUS
  Administered 2015-05-22 – 2015-05-24 (×4): 2 [IU] via SUBCUTANEOUS
  Administered 2015-05-25: 5 [IU] via SUBCUTANEOUS
  Administered 2015-05-25: 2 [IU] via SUBCUTANEOUS
  Filled 2015-05-18 (×7): qty 2
  Filled 2015-05-18 (×2): qty 5

## 2015-05-18 MED ORDER — ALBUTEROL SULFATE (2.5 MG/3ML) 0.083% IN NEBU: 2.5000 mg | INHALATION_SOLUTION | RESPIRATORY_TRACT | Status: DC | PRN

## 2015-05-18 MED ORDER — PANTOPRAZOLE SODIUM 40 MG PO TBEC
40.0000 mg | DELAYED_RELEASE_TABLET | Freq: Every day | ORAL | Status: DC
  Administered 2015-05-18 – 2015-05-25 (×7): 40 mg via ORAL
  Filled 2015-05-18 (×10): qty 1

## 2015-05-18 MED ORDER — PATIROMER SORBITEX CALCIUM 8.4 G PO PACK
8.4000 g | PACK | Freq: Every day | ORAL | Status: DC
Start: 2015-05-18 — End: 2015-05-19
  Administered 2015-05-19: 8.4 g via ORAL
  Filled 2015-05-18 (×3): qty 4

## 2015-05-18 MED ORDER — SODIUM CHLORIDE 0.9 % IV SOLN
Freq: Once | INTRAVENOUS | Status: AC
  Administered 2015-05-18: 20:00:00 via INTRAVENOUS

## 2015-05-18 MED ORDER — GABAPENTIN 300 MG PO CAPS
300.0000 mg | ORAL_CAPSULE | Freq: Every day | ORAL | Status: DC
  Administered 2015-05-18: 300 mg via ORAL
  Filled 2015-05-18: qty 1

## 2015-05-18 MED ORDER — OXYCODONE HCL 5 MG PO TABS
5.0000 mg | ORAL_TABLET | ORAL | Status: DC | PRN
  Administered 2015-05-22: 5 mg via ORAL
  Filled 2015-05-18: qty 1

## 2015-05-18 MED ORDER — PIPERACILLIN-TAZOBACTAM 3.375 G IVPB: 3.3750 g | Freq: Once | INTRAVENOUS | Status: DC

## 2015-05-18 MED ORDER — HYDROCODONE-ACETAMINOPHEN 7.5-325 MG PO TABS
1.0000 | ORAL_TABLET | Freq: Two times a day (BID) | ORAL | Status: DC
  Administered 2015-05-18 – 2015-05-24 (×7): 1 via ORAL
  Filled 2015-05-18 (×13): qty 1

## 2015-05-18 MED ORDER — TRAMADOL HCL 50 MG PO TABS: 50.0000 mg | ORAL_TABLET | ORAL | Status: DC | PRN

## 2015-05-18 MED ORDER — TOPIRAMATE 25 MG PO TABS
25.0000 mg | ORAL_TABLET | Freq: Two times a day (BID) | ORAL | Status: DC
  Administered 2015-05-18 – 2015-05-25 (×12): 25 mg via ORAL
  Filled 2015-05-18 (×14): qty 1

## 2015-05-18 MED ORDER — FUROSEMIDE 40 MG PO TABS
40.0000 mg | ORAL_TABLET | Freq: Every day | ORAL | Status: DC
  Administered 2015-05-18 – 2015-05-22 (×3): 40 mg via ORAL
  Filled 2015-05-18 (×6): qty 1

## 2015-05-18 MED ORDER — INSULIN ASPART 100 UNIT/ML ~~LOC~~ SOLN: 0.0000 [IU] | Freq: Every day | SUBCUTANEOUS | Status: DC

## 2015-05-18 MED ORDER — VANCOMYCIN HCL IN DEXTROSE 1-5 GM/200ML-% IV SOLN: 1000.0000 mg | Freq: Once | INTRAVENOUS | Status: DC

## 2015-05-18 MED ORDER — CARVEDILOL 25 MG PO TABS
50.0000 mg | ORAL_TABLET | Freq: Two times a day (BID) | ORAL | Status: DC
  Administered 2015-05-19 – 2015-05-22 (×5): 50 mg via ORAL
  Filled 2015-05-18 (×7): qty 2

## 2015-05-18 MED ORDER — VANCOMYCIN HCL IN DEXTROSE 1-5 GM/200ML-% IV SOLN
1000.0000 mg | Freq: Two times a day (BID) | INTRAVENOUS | Status: DC
  Administered 2015-05-19 – 2015-05-20 (×3): 1000 mg via INTRAVENOUS
  Filled 2015-05-18 (×3): qty 200

## 2015-05-18 MED ORDER — ONDANSETRON HCL 4 MG PO TABS: 4.0000 mg | ORAL_TABLET | Freq: Four times a day (QID) | ORAL | Status: DC | PRN

## 2015-05-18 MED ORDER — DULOXETINE HCL 30 MG PO CPEP
30.0000 mg | ORAL_CAPSULE | Freq: Two times a day (BID) | ORAL | Status: DC
  Administered 2015-05-18: 30 mg via ORAL
  Filled 2015-05-18 (×2): qty 1

## 2015-05-18 NOTE — H&P (Signed)
[facility] - [place] at [facility]   PATIENT NAME: Juan Roberson    MR#:  [id number]  DATE OF BIRTH:  11-02-1970  DATE OF ADMISSION:  05/18/2015  PRIMARY CARE PHYSICIAN: Corky Downs, MD   REQUESTING/REFERRING PHYSICIAN: Renford Dills, NP  CHIEF COMPLAINT:   Chief Complaint  Patient presents with  . Foot Pain   left foot pain and swelling.  HISTORY OF PRESENT ILLNESS:  Juan Roberson  is a 44 y.o. male with known history of left foot cellulitis with abscess, recent left lower extremity DVT, hypertension, diabetes and a CKD. The patient was found to have left foot abscess, got antibiotics treatment and debridement in the hospital last month. He was discharged with the antibiotics and wound care. He was also found to have a left lower extremity DVT. He is taking clindamycin, Cipro and Coumadin. He still have left foot pain continuously. Dr. Alberteen Spindle, podiatrist, suggested I MRI of the left foot which show Osteomyelitis of the proximal phalanx of the third toe. Dr. Alberteen Spindle suggest the patient came to the ED and start IV antibiotics. He is going to do surgery tomorrow. The patient had low grade fever and chills but denies any other symptoms.  PAST MEDICAL HISTORY:   Past Medical History  Diagnosis Date  . Diabetes mellitus without complication   . Hypertension   . Chronic kidney disease   . GERD (gastroesophageal reflux disease)   . Cellulitis and abscess of foot     Left-Dr. Ether Griffins    PAST SURGICAL HISTORY:   Past Surgical History  Procedure Laterality Date  . No past surgeries    . I&d extremity Left 04/06/2015    Procedure: IRRIGATION AND DEBRIDEMENT EXTREMITY;  Surgeon: Gwyneth Revels, DPM;  Location: [facility] ORS;  Service: Podiatry;  Laterality: Left;  . Esophagogastroduodenoscopy N/A 04/07/2015    Procedure: ESOPHAGOGASTRODUODENOSCOPY (EGD);  Surgeon: Midge Minium, MD;  [facility] END[facility]COPY;  Service: Endoscopy;  Laterality: N/A;  . Foot surgery       SOCIAL HISTORY:   Social History  Substance Use Topics  . Smoking status: Never Smoker   . Smokeless tobacco: Not on file  . Alcohol Use: No    FAMILY HISTORY:   Family History  Problem Relation Age of Onset  . Coronary artery disease Father   . Pancreatic cancer Mother   . Breast cancer Sister   . Lung cancer Brother   . Pancreatic cancer Mother     DRUG ALLERGIES:   Allergies  Allergen Reactions  . Sulfur Other (See Comments)    Renal failure    REVIEW OF SYSTEMS:  CONSTITUTIONAL: No fever, has weakness.  EYES: No blurred or double vision.  EARS, NOSE, AND THROAT: No tinnitus or ear pain.  RESPIRATORY: No cough, shortness of breath, wheezing or hemoptysis.  CARDIOVASCULAR: No chest pain, orthopnea, edema.  GASTROINTESTINAL: No nausea, vomiting, diarrhea or abdominal pain.  GENITOURINARY: No dysuria, hematuria.  ENDOCRINE: No polyuria, nocturia,  HEMATOLOGY: No anemia, easy bruising or bleeding SKIN: No rash or lesion. MUSCULOSKELETAL: Left foot pain and swelling. NEUROLOGIC: No tingling, numbness, weakness.  PSYCHIATRY: No anxiety or depression.   MEDICATIONS AT HOME:   Prior to Admission medications   Medication Sig Start Date End Date Taking? Authorizing Provider  amLODipine (NORVASC) 10 MG tablet Take 1 tablet (10 mg total) by mouth daily. 04/08/15  Yes Shaune Pollack, MD  carvedilol (COREG) 25 MG tablet Take 50 mg by mouth 2 (two) times daily. 03/09/15  Yes Historical Provider, MD  ciprofloxacin (CIPRO) 500 MG tablet Take 1 tablet by mouth 2 (two) times daily. 05/13/15 05/23/15 Yes Historical Provider, MD  clindamycin (CLEOCIN) 300 MG capsule Take 1 capsule by mouth 4 (four) times daily. 05/13/15 05/23/15 Yes Historical Provider, MD  cloNIDine (CATAPRES) 0.2 MG tablet Take 0.2 mg by mouth daily. 04/30/15  Yes Historical Provider, MD  doxycycline (VIBRA-TABS) 100 MG tablet Take 1 tablet by mouth 2 (two) times daily. 05/17/15 05/27/15 Yes Historical Provider, MD   DULoxetine (CYMBALTA) 30 MG capsule Take 30 mg by mouth 2 (two) times daily. 03/26/15  Yes Historical Provider, MD  furosemide (LASIX) 40 MG tablet Take 40 mg by mouth daily.   Yes Historical Provider, MD  gabapentin (NEURONTIN) 300 MG capsule Take 300 mg by mouth daily. 03/04/15  Yes Historical Provider, MD  glyBURIDE (DIABETA) 5 MG tablet  05/18/15  Yes Historical Provider, MD  hydrALAZINE (APRESOLINE) 100 MG tablet Take 100 mg by mouth 3 (three) times daily. 04/17/15  Yes Historical Provider, MD  HYDROcodone-acetaminophen (NORCO) 7.5-325 MG per tablet Take 1 tablet by mouth 2 (two) times daily. 04/30/15  Yes Historical Provider, MD  IODOSORB 0.9 % gel Apply 1 application topically daily as needed. 04/16/15  Yes Historical Provider, MD  LYRICA 75 MG capsule Take 75 mg by mouth 3 (three) times daily. 03/08/15  Yes Historical Provider, MD  ondansetron (ZOFRAN) 4 MG tablet Take 1 tablet by mouth as needed. 04/13/15  Yes Historical Provider, MD  pantoprazole (PROTONIX) 40 MG tablet Take 1 tablet (40 mg total) by mouth daily. 04/08/15  Yes Shaune Pollack, MD  patiromer [facility]) 8.4 G packet Take 8.4 g by mouth daily.   Yes Historical Provider, MD  topiramate (TOPAMAX) 25 MG tablet Take 25 mg by mouth 2 (two) times daily. 04/12/15  Yes Historical Provider, MD  traMADol (ULTRAM) 50 MG tablet Take 1 tablet by mouth every 4 (four) hours as needed. 05/13/15  Yes Historical Provider, MD  TRESIBA FLEXTOUCH 200 UNIT/ML SOPN Inject 16 Units into the skin daily. 03/29/15  Yes Historical Provider, MD  warfarin (COUMADIN) 5 MG tablet Take 1 tablet by mouth daily. 05/17/15  Yes Historical Provider, MD      VITAL SIGNS:  Blood pressure 193/113, pulse 112, temperature 99.6 F (37.6 C), temperature source Oral, resp. rate 16, height 6\' 3"  (1.905 m), weight 90.266 kg (199 lb), SpO2 97 %.  PHYSICAL EXAMINATION:  GENERAL:  44 y.o.-year-old patient lying in the bed with no acute distress.  EYES: Pupils equal, round, reactive to  light and accommodation. No scleral icterus. Extraocular muscles intact.  HEENT: Head atraumatic, normocephalic. Oropharynx and nasopharynx clear.  NECK:  Supple, no jugular venous distention. No thyroid enlargement, no tenderness.  LUNGS: Normal breath sounds bilaterally, no wheezing, rales,rhonchi or crepitation. No use of accessory muscles of respiration.  CARDIOVASCULAR: S1, S2 normal. No murmurs, rubs, or gallops.  ABDOMEN: Soft, nontender, nondistended. Bowel sounds present. No organomegaly or mass.  EXTREMITIES: No pedal edema, cyanosis, or clubbing. No calf tenderness. Left foot swelling and tenderness. NEUROLOGIC: Cranial nerves II through XII are intact. Muscle strength 5/5 in all extremities. Sensation intact. Gait not checked.  PSYCHIATRIC: The patient is alert and oriented x 3.  SKIN: No obvious rash, lesion, or ulcer.   LABORATORY PANEL:   CBC  Recent Labs Lab 05/18/15 1357  WBC 5.7  HGB 8.4*  HCT 27.0*  PLT 165   ------------------------------------------------------------------------------------------------------------------  Chemistries   Recent Labs Lab 05/18/15 1357  NA 142  K 3.9  CL 111  CO2 21*  GLUCOSE 131*  BUN 35*  CREATININE 1.78*  CALCIUM 9.5   ------------------------------------------------------------------------------------------------------------------  Cardiac Enzymes No results for input(s): TROPONINI in the last 168 hours. ------------------------------------------------------------------------------------------------------------------  RADIOLOGY:  Mr Foot Left Wo Contrast  05/18/2015   CLINICAL DATA:  Injury on June 30th stepping on a nail. MRSA. Possible osteomyelitis. Deep vein thrombosis.  EXAM: MRI OF THE LEFT FOREFOOT WITHOUT CONTRAST  TECHNIQUE: Multiplanar, multisequence MR imaging was performed. No intravenous contrast was administered.  COMPARISON:  04/05/2015  FINDINGS: As abnormal osseous edema primarily in the proximal  phalanx of the third toe but also to a lesser extent in the heads of the second, third, and fourth metatarsals. There is spurring at the Lisfranc joint without Lisfranc joint malalignment, and the Lisfranc ligament appears intact.  Dorsal subcutaneous edema along the foot noted with edema tracking within along the plantar musculature of the foot. Focally confluent edema projects along the web space between the proximal phalanges of the third and fourth toes, in the vicinity of what appears to potentially be a puncture wound or draining sinus site on image 15 series 5 along the plantar ball of the foot.  Trace effusion of the middle subtalar facet.  IMPRESSION: 1. 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 Possible osteomyelitis involving the heads of the second, third, and fourth metatarsals. 2. Phlegmon and potentially incipient abscess tracking in the web space between the third and fourth proximal phalanges, with small locules of gas in the soft tissues in this vicinity, and extensive soft tissue edema. Possible draining sinus tract, correlate with site of puncture. 3. Dorsal subcutaneous edema and edema infiltrating the plantar musculature of the foot, likely representing cellulitis and myositis, respectively.   Electronically Signed   By: Gaylyn Rong M.D.   On: 05/18/2015 19:07    EKG:   Orders placed or performed in visit on 07/26/14  . 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 I will start Cipro and vancomycin IVPB, follow-up blood culture, CBC and wound culture. Follow-up podiatrist for surgery tomorrow. Hold Coumadin and check INR. Continue hypertension medication. For diabetes, I will start sliding scale and Lantus 16 units subcutaneous at bedtime.   All the records are reviewed and case  discussed with ED provider. Management plans discussed with the patient, his father and they are in agreement.  CODE STATUS: Full code  TOTAL TIME TAKING CARE OF THIS PATIENT: 56 minutes.    Shaune Pollack M.D on 05/18/2015 at 8:51 PM  Between 7am to 6pm - Pager - [phone]  After 6pm go to [email or web address] - password [email or web address] [facility]  [place] [facility]  Office  [phone]  CC: Primary care physician; Corky Downs, MD

## 2015-05-18 NOTE — Consult Note (Signed)
Reason for Consult: Osteomyelitis left third toe with abscess of the left forefoot Referring Physician: Dr. Raiford Noble Jerimiah Wolman. is an 44 y.o. male.  HPI: The patient relates a history of stepping on a screw at work approximately 1-1/2 months ago on June 30. He initially had a debridement performed by Dr. Vickki Muff who is been following him outpatient. He has been on multiple rounds of antibiotics over that time with a continued ulceration beneath the forefoot. Recently had cultures taken which showed MRSA. There was concern for continued infection and attempt was made to have an outpatient MRI performed but this was delayed having to go to the [employer] process. He developed significant nausea and vomiting over the last couple of days and recommendation was to be seen in the emergency department for evaluation and MRI of the foot. MRI was performed earlier today which did reveal osteomyelitis in the third toe with continued abscess in the forefoot. Decision was made for hospitalization for IV antibiotics and debridement  Past Medical History  Diagnosis Date  . Diabetes mellitus without complication   . Hypertension   . Chronic kidney disease   . GERD (gastroesophageal reflux disease)   . Cellulitis and abscess of foot     Left-Dr. Vickki Muff    Past Surgical History  Procedure Laterality Date  . No past surgeries    . I&d extremity Left 04/06/2015    Procedure: IRRIGATION AND DEBRIDEMENT EXTREMITY;  Surgeon: Samara Deist, DPM;  Location: [facility] ORS;  Service: Podiatry;  Laterality: Left;  . Esophagogastroduodenoscopy N/A 04/07/2015    Procedure: ESOPHAGOGASTRODUODENOSCOPY (EGD);  Surgeon: Lucilla Lame, MD;  Location: [facility] ENDOSCOPY;  Service: Endoscopy;  Laterality: N/A;  . Foot surgery      Family History  Problem Relation Age of Onset  . Coronary artery disease Father   . Pancreatic cancer Mother   . Breast cancer Sister   . Lung cancer Brother   . Pancreatic cancer Mother      Social History:  reports that he has never smoked. He does not have any smokeless tobacco history on file. He reports that he does not drink alcohol or use illicit drugs.  Allergies:  Allergies  Allergen Reactions  . Sulfur Other (See Comments)    Renal failure    Medications: I have reviewed the patient's current medications.  Results for orders placed or performed during the hospital encounter of 05/18/15 (from the past 48 hour(s))  Basic metabolic panel     Status: Abnormal   Collection Time: 05/18/15  1:57 PM  Result Value Ref Range   Sodium 142 135 - 145 mmol/L   Potassium 3.9 3.5 - 5.1 mmol/L   Chloride 111 101 - 111 mmol/L   CO2 21 (L) 22 - 32 mmol/L   Glucose, Bld 131 (H) 65 - 99 mg/dL   BUN 35 (H) 6 - 20 mg/dL   Creatinine, Ser 1.78 (H) 0.61 - 1.24 mg/dL   Calcium 9.5 8.9 - 10.3 mg/dL   GFR calc non Af Amer 44 (L) >60 mL/min   GFR calc Af Amer 51 (L) >60 mL/min    Comment: (NOTE) The eGFR has been calculated using the CKD EPI equation. This calculation has not been validated in all clinical situations. eGFR's persistently <60 mL/min signify possible Chronic Kidney Disease.    Anion gap 10 5 - 15  CBC with Differential     Status: Abnormal   Collection Time: 05/18/15  1:57 PM  Result Value Ref Range  WBC 5.7 3.8 - 10.6 K/uL   RBC 3.37 (L) 4.40 - 5.90 MIL/uL   Hemoglobin 8.4 (L) 13.0 - 18.0 g/dL   HCT 27.0 (L) 40.0 - 52.0 %   MCV 80.2 80.0 - 100.0 fL   MCH 25.0 (L) 26.0 - 34.0 pg   MCHC 31.2 (L) 32.0 - 36.0 g/dL   RDW 14.5 11.5 - 14.5 %   Platelets 165 150 - 440 K/uL   Neutrophils Relative % 70 %   Neutro Abs 4.0 1.4 - 6.5 K/uL   Lymphocytes Relative 20 %   Lymphs Abs 1.1 1.0 - 3.6 K/uL   Monocytes Relative 8 %   Monocytes Absolute 0.4 0.2 - 1.0 K/uL   Eosinophils Relative 2 %   Eosinophils Absolute 0.1 0 - 0.7 K/uL   Basophils Relative 0 %   Basophils Absolute 0.0 0 - 0.1 K/uL  Glucose, capillary     Status: Abnormal   Collection Time: 05/18/15   9:32 PM  Result Value Ref Range   Glucose-Capillary 111 (H) 65 - 99 mg/dL    Mr Foot Left Wo Contrast  05/18/2015   CLINICAL DATA:  Injury on June 30th stepping on a nail. MRSA. Possible osteomyelitis. Deep vein thrombosis.  EXAM: MRI OF THE LEFT FOREFOOT WITHOUT CONTRAST  TECHNIQUE: Multiplanar, multisequence MR imaging was performed. No intravenous contrast was administered.  COMPARISON:  04/05/2015  FINDINGS: As abnormal osseous edema primarily in the proximal phalanx of the third toe but also to a lesser extent in the heads of the second, third, and fourth metatarsals. There is spurring at the Lisfranc joint without Lisfranc joint malalignment, and the Lisfranc ligament appears intact.  Dorsal subcutaneous edema along the foot noted with edema tracking within along the plantar musculature of the foot. Focally confluent edema projects along the web space between the proximal phalanges of the third and fourth toes, in the vicinity of what appears to potentially be a puncture wound or draining sinus site on image 15 series 5 along the plantar ball of the foot.  Trace effusion of the middle subtalar facet.  IMPRESSION: 1. 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 Possible osteomyelitis involving the heads of the second, third, and fourth metatarsals. 2. Phlegmon and potentially incipient abscess tracking in the web space between the third and fourth proximal phalanges, with small locules of gas in the soft tissues in this vicinity, and extensive soft tissue edema. Possible draining sinus tract, correlate with site of puncture. 3. Dorsal subcutaneous edema and edema infiltrating the plantar musculature of the foot, likely representing cellulitis and myositis, respectively.   Electronically Signed   By: Van Clines M.D.   On: 05/18/2015 19:07    Review of Systems  Constitutional: Positive for chills.  HENT: Negative.   Eyes: Negative.   Respiratory: Negative.   Cardiovascular:  Negative.   Gastrointestinal: Positive for nausea and vomiting.  Genitourinary: Negative.   Musculoskeletal:       Pain in the left forefoot and toes.  Skin:       Swelling and redness in the left forefoot that has been persistent since his injury. Denies any current drainage from the wound  Neurological: Positive for weakness.       He does relate numbness and paresthesias in the feet related to his diabetes.  Endo/Heme/Allergies: Negative.   Psychiatric/Behavioral: Negative.    Blood pressure 195/100, pulse 118, temperature 98.7 F (37.1 C), temperature source Oral, resp. rate 19, height 6' 3" (1.905 m),  weight 88.678 kg (195 lb 8 oz), SpO2 100 %. Physical Exam  Cardiovascular:  DP and PT pulses are fully palpable. Capillary filling time intact to all digits.  Musculoskeletal:  Guarded range of motion in the forefoot joints on the left. Muscle testing is deferred. Pain on palpation around the third toe and metatarsal area.  Neurological:  There is loss of protective threshold with a monofilament wire from the ankle distal to the toes. Proprioception is impaired on the left foot. Appears intact on the right  Skin:  Skin is warm dry and supple. There is erythema and edema in the left forefoot around the base of the second third and fourth toes. An intact eschar is noted on the plantar aspect of the left foot at site of the puncture wound. No active drainage.    Assessment/Plan: Assessment: Osteomyelitis left third toe proximal phalanx with abscess and left forefoot  Plan: Discussed with the patient the need for debridement of the infected portion of bone in his left third toe as well as debridement of the left forefoot. Discussed the possibility of amputation of the third toe but at this point it appears clearly viable and he would like to pursue with just debridement of bone. The possibility of continued infection and further debridements or surgeries was discussed with the patient. We  will obtain a consent form for debridement of infected soft tissues and bone on the left foot and plan for this at some point tomorrow. Plan for the patient to be nothing by mouth after breakfast as it will most likely be tomorrow evening.  CLINE,TODD W. 05/18/2015, 10:18 PM

## 2015-05-18 NOTE — ED Notes (Signed)
Mri said it would be around 6 pm

## 2015-05-18 NOTE — ED Provider Notes (Signed)
Juan Roberson  ____________________________________________  Time seen: Approximately 12:20 PM  I have reviewed the triage vital signs and the nursing notes.   HISTORY  Chief Complaint Foot Pain    HPI Juan Roberson. is a 44 y.o. male who presents for evaluation of left foot pain which has been continuous since since the injury on June 30. Patient has been hospitalized for the same recently diagnosed with MRSA and blood clots to his left lower leg. The patient was encouraged to come over here via podiatry on call to have an MRI of his left foot to rule out osteomyelitis.   Past Medical History  Diagnosis Date  . Diabetes mellitus without complication   . Hypertension   . Chronic kidney disease   . GERD (gastroesophageal reflux disease)   . Cellulitis and abscess of foot     Left-Dr. Ether Griffins    Patient Active Problem List   Diagnosis Date Noted  . Hematemesis with nausea   . Gastritis   . Acute esophagitis   . Duodenitis   . Cellulitis 04/05/2015    Past Surgical History  Procedure Laterality Date  . No past surgeries    . I&d extremity Left 04/06/2015    Procedure: IRRIGATION AND DEBRIDEMENT EXTREMITY;  Surgeon: Gwyneth Revels, DPM;  Location: [facility] ORS;  Service: Podiatry;  Laterality: Left;  . Esophagogastroduodenoscopy N/A 04/07/2015    Procedure: ESOPHAGOGASTRODUODENOSCOPY (EGD);  Surgeon: Midge Minium, MD;  Location: [facility] ENDOSCOPY;  Service: Endoscopy;  Laterality: N/A;  . Foot surgery      Current Outpatient Rx  Name  Route  Sig  Dispense  Refill  . amLODipine (NORVASC) 10 MG tablet   Oral   Take 1 tablet (10 mg total) by mouth daily.   30 tablet   0   . ciprofloxacin (CIPRO) 500 MG tablet   Oral   Take 1 tablet (500 mg total) by mouth 2 (two) times daily.   20 tablet   0   . cloNIDine (CATAPRES) 0.1 MG tablet   Oral   Take 0.1 mg by mouth 2 (two) times daily.         . furosemide (LASIX)  40 MG tablet   Oral   Take 40 mg by mouth daily.         . ondansetron (ZOFRAN) 4 MG tablet   Oral   Take 1 tablet (4 mg total) by mouth every 8 (eight) hours as needed for nausea or vomiting.   20 tablet   0   . oxyCODONE-acetaminophen (ROXICET) 5-325 MG per tablet   Oral   Take 1-2 tablets by mouth every 6 (six) hours as needed for severe pain.   25 tablet   0   . pantoprazole (PROTONIX) 40 MG tablet   Oral   Take 1 tablet (40 mg total) by mouth daily.   30 tablet   0   . patiromer (VELTASSA) 8.4 G packet   Oral   Take 8.4 g by mouth daily.           Allergies Sulfur  Family History  Problem Relation Age of Onset  . Coronary artery disease Father   . Pancreatic cancer Mother   . Breast cancer Sister   . Lung cancer Brother   . Pancreatic cancer Mother     Social History Social History  Substance Use Topics  . Smoking status: Never Smoker   . Smokeless tobacco: None  . Alcohol Use: No  Review of Systems Constitutional: No fever/chills Eyes: No visual changes. ENT: No sore throat. Cardiovascular: Denies chest pain. Respiratory: Denies shortness of breath. Gastrointestinal: No abdominal pain.  No nausea, no vomiting.  No diarrhea.  No constipation. Genitourinary: Negative for dysuria. Musculoskeletal: Positive for erythema redness drainage to the left foot. Drainage minimally on the bottom noticed on the gauze. Skin: Negative for rash. Neurological: Negative for headaches, focal weakness or numbness.  10-point ROS otherwise negative.  ____________________________________________   PHYSICAL EXAM:  VITAL SIGNS: ED Triage Vitals  Enc Vitals Group     BP 05/18/15 1205 196/98 mmHg     Pulse Rate 05/18/15 1205 113     Resp 05/18/15 1205 24     Temp 05/18/15 1205 98.9 F (37.2 C)     Temp Source 05/18/15 1205 Oral     SpO2 05/18/15 1205 98 %     Weight 05/18/15 1205 199 lb (90.266 kg)     Height 05/18/15 1205 6\' 3"  (1.905 m)     Head Cir --       Peak Flow --      Pain Score 05/18/15 1206 8     Pain Loc --      Pain Edu? --      Excl. in GC? --     Constitutional: Alert and oriented. Well appearing and in no acute distress. Vital signs noted patient tachycardic with elevated blood pressure. Eyes: Conjunctivae are normal. PERRL. EOMI. Head: Atraumatic. Nose: No congestion/rhinnorhea. Mouth/Throat: Mucous membranes are moist.  Oropharynx non-erythematous. Neck: No stridor.   Cardiovascular: Normal rate, regular rhythm. Grossly normal heart sounds.  Good peripheral circulation. Respiratory: Normal respiratory effort.  No retractions. Lungs CTAB. Gastrointestinal: Soft and nontender. No distention. No abdominal bruits. No CVA tenderness. Musculoskeletal: Positive left foot tenderness with obvious erythema dorsally. Neurologic:  Normal speech and language. No gross focal neurologic deficits are appreciated. No gait instability. Skin:  Skin is warm, dry and intact. No rash noted. Psychiatric: Mood and affect are normal. Speech and behavior are normal.  ____________________________________________   LABS (all labs ordered are listed, but only abnormal results are displayed)  Labs Reviewed  BASIC METABOLIC PANEL - Abnormal; Notable for the following:    CO2 21 (*)    Glucose, Bld 131 (*)    BUN 35 (*)    Creatinine, Ser 1.78 (*)    GFR calc non Af Amer 44 (*)    GFR calc Af Amer 51 (*)    All other components within normal limits  CBC WITH DIFFERENTIAL/PLATELET - Abnormal; Notable for the following:    RBC 3.37 (*)    Hemoglobin 8.4 (*)    HCT 27.0 (*)    MCH 25.0 (*)    MCHC 31.2 (*)    All other components within normal limits   ____________________________________________   RADIOLOGY  MRI left foot ordered: ____________________________________________   PROCEDURES  Procedure(s) performed: None  Critical Care performed: No  ____________________________________________   INITIAL IMPRESSION /  ASSESSMENT AND PLAN / ED COURSE  Pertinent labs & imaging results that were available during my care of the patient were reviewed by me and considered in my medical decision making (see chart for details).  Spoke with attending on call Dr. Alberteen Spindle from podiatry. Baseline lab work ordered CBC, BMP. MRI left foot ordered. At the time of this Roberson the MRI results were not back. Care is being turned over to Juan Kelp, Juan Roberson. ____________________________________________   FINAL CLINICAL IMPRESSION(S) / ED DIAGNOSES  Final diagnoses:  Infection      Evangeline Dakin, PA-C 05/18/15 1507  Sharyn Creamer, MD 05/19/15 2235

## 2015-05-18 NOTE — ED Notes (Signed)
Had left foot surgey June 30,has been seeing dr Ether Griffins, was called to come for possible infection

## 2015-05-18 NOTE — ED Notes (Signed)
Patient transported to MRI 

## 2015-05-18 NOTE — ED Notes (Signed)
receently had surgery on left foot from screw going into foot June 30 at work, is still seeing md, has blood clots in foot, positive for mrsa and type b strep, left foot is swollen , has dsy on foot

## 2015-05-18 NOTE — Progress Notes (Signed)
ANTIBIOTIC CONSULT NOTE - INITIAL  Pharmacy Consult for Vancomycin Indication: osteomyelitis  Allergies  Allergen Reactions  . Sulfur Other (See Comments)    Renal failure    Patient Measurements: Height:  (190.5 cm) Weight: 195 lb 8 oz (88.678 kg) IBW/kg (Calculated) : 84.5 Adjusted Body Weight: 86.8 kg  Vital Signs: Temp: 98.7 F (37.1 C) (08/16 2121) Temp Source: Oral (08/16 2121) BP: 195/100 mmHg (08/16 2133) Pulse Rate: 118 (08/16 2121) Intake/Output from previous day:   Intake/Output from this shift:    Labs:  Recent Labs  05/18/15 1357  WBC 5.7  HGB 8.4*  PLT 165  CREATININE 1.78*   Estimated Creatinine Clearance: 62 mL/min (by C-G formula based on Cr of 1.78). No results for input(s): VANCOTROUGH, VANCOPEAK, VANCORANDOM, GENTTROUGH, GENTPEAK, GENTRANDOM, TOBRATROUGH, TOBRAPEAK, TOBRARND, AMIKACINPEAK, AMIKACINTROU, AMIKACIN in the last 72 hours.   Microbiology: Recent Results (from the past 720 hour(s))  Wound culture     Status: None   Collection Time: 05/13/15  2:50 AM  Result Value Ref Range Status   Specimen Description FOOT  Final   Special Requests NONE  Final   Gram Stain   Final    FEW WBC SEEN FEW GRAM POSITIVE COCCI RARE GRAM NEGATIVE RODS    Culture   Final    MODERATE GROWTH METHICILLIN RESISTANT STAPHYLOCOCCUS AUREUS MODERATE GROWTH GROUP B STREP(S.AGALACTIAE)ISOLATED CRITICAL RESULT CALLED TO, READ BACK BY AND VERIFIED WITH: LAURIE WOODS AR 1515 05/17/15 CTJ Virtually 100% of S. agalactiae (Group B) strains are susceptible to Penicillin.  For Penicillin-allergic patients, Erythromycin (85-95% sensitive) and Clindamycin (80% sensitive) are drugs of choice. Contact microbiology lab to request sensitivities if  needed within 7 days.    Report Status 05/18/2015 FINAL  Final   Organism ID, Bacteria METHICILLIN RESISTANT STAPHYLOCOCCUS AUREUS  Final      Susceptibility   Methicillin resistant staphylococcus aureus - MIC*   CIPROFLOXACIN >=8 RESISTANT Resistant     GENTAMICIN <=0.5 SENSITIVE Sensitive     OXACILLIN Value in next row Resistant      >=4 MODERATELY RESISTANT    VANCOMYCIN <=0.5 SENSITIVE Sensitive     TRIMETH/SULFA <=10 SENSITIVE Sensitive     CEFOXITIN SCREEN Value in next row Resistant      POSITIVECEFOXITIN SCREEN - This test may be used to predict mecA-mediated oxacillin resistance, and it is based on the cefoxitin disk screen test.  The cefoxitin screen and oxacillin work in combination to determine the final interpretation reported for oxacillin.     Inducible Clindamycin Value in next row Sensitive      POSITIVECEFOXITIN SCREEN - This test may be used to predict mecA-mediated oxacillin resistance, and it is based on the cefoxitin disk screen test.  The cefoxitin screen and oxacillin work in combination to determine the final interpretation reported for oxacillin.     ERYTHROMYCIN Value in next row Resistant      RESISTANT>=8    TETRACYCLINE Value in next row Sensitive      SENSITIVE<=1    CLINDAMYCIN Value in next row Sensitive      SENSITIVE<=0.25    LINEZOLID Value in next row Sensitive      SENSITIVE2    * MODERATE GROWTH METHICILLIN RESISTANT STAPHYLOCOCCUS AUREUS  Anaerobic culture     Status: None (Preliminary result)   Collection Time: 05/13/15  2:50 AM  Result Value Ref Range Status   Specimen Description FOOT  Final   Special Requests NONE  Final   Culture HOLDING FOR POSSIBLE  ANAEROBE  Final   Report Status PENDING  Incomplete    Medical History: Past Medical History  Diagnosis Date  . Diabetes mellitus without complication   . Hypertension   . Chronic kidney disease   . GERD (gastroesophageal reflux disease)   . Cellulitis and abscess of foot     Left-Dr. Ether Griffins    Medications:  Infusions:   Assessment: 44 yom cc foot pain with known history of cellulitis/abscess. MRI of left foot showed osteo. Now starting cipro/vancomycin.   CrCl 66 mL/min, Vd 63 L, Ke 0.059  hr-1, T 1/2 11.6 hr.   Goal of Therapy:  Vancomycin trough level 15-20 mcg/ml  Plan:  Measure antibiotic drug levels at steady state Follow up culture results   Start vancomycin 1 gm IV Q12H with stacked dosing (first dose now, second dose in 6 hours). Will measure trough level before fourth dose and adjust as needed to maintain trough 15 to 20 mcg/mL.   Carola Frost, Pharm.D. Clinical Pharmacist 05/18/2015,9:54 PM

## 2015-05-18 NOTE — ED Provider Notes (Signed)
----------------------------------------- 1620 PM on 05/18/2015 -----------------------------------------   Blood pressure 185/94, pulse 112, temperature 98.3 F (36.8 C), temperature source Oral, resp. rate 24, height  (1.905 m), weight 199 lb (90.266 kg), SpO2 97 %.  Today's Vitals   05/18/15 1540 05/18/15 1610 05/18/15 1920 05/18/15 2055  BP: 164/74 170/89 193/113 195/100  Pulse: 94 110 112 111  Temp:  98.7 F (37.1 C) 99.6 F (37.6 C) 99.1 F (37.3 C)  TempSrc:  Oral Oral Oral  Resp: Height:      Weight:      SpO2:  97% 97% 97%  PainSc:  5   0-No pain     Assuming care from Lawernce Pitts PA at 1600.  In short, Juan Roberson. is a 44 y.o. male with a chief complaint of Foot Pain   patient reports that he has had continuous left foot pain since the end of June. Patient states that the pain was initiated after accidentally stepping on a nail. Patient reports that he found out this week that he has MRSA in the wound as well as he has a left lower leg DVT. Patient reports that he is currently taking clindamycin, ciprofloxacin as well as Coumadin.Juan Roberson  Refer to the original H&P for additional details.  Patient reports that he has been following with Dr. Alberteen Spindle and Dr. Ether Griffins podiatry outpatient. Patient reports that in mid July he was admitted the hospital for debridement of his left foot. Patient reports that his left foot had been doing better but in the last 2 days he had swelling and increased pain to the left foot. Patient states that at this time pain is at 3 out of 10 and states that pain is "okay." Patient states that it has not been draining but the bottom of his foot has quickly became swollen.  Patient denies nausea, vomiting, fever, chest pain, shortness of breath, dizziness, weakness. Patient reports left plantar foot pain and occasional left calf pain. His reports that he has been eating and drinking well at home. Patient reports that he is a  diabetic.  Reports tetanus immunization is up-to-date.  Charles beers PA spoke with Dr. Alberteen Spindle podiatry, per Haig Prophet plan is to go forward with MRI. Awaiting MRI at this time.  Past Medical History  Diagnosis Date  . Diabetes mellitus without complication   . Hypertension   . Chronic kidney disease   . GERD (gastroesophageal reflux disease)   . Cellulitis and abscess of foot     Left-Dr. Ether Griffins  Anemia (HgB 8.8 on 04/07/2015)   Patient Active Problem List   Diagnosis Date Noted  . Osteomyelitis of ankle or foot, acute 05/18/2015  . Foot abscess, left 05/18/2015  . Hematemesis with nausea   . Gastritis   . Acute esophagitis   . Duodenitis   . Cellulitis 04/05/2015    Past Surgical History  Procedure Laterality Date  . No past surgeries    . I&d extremity Left 04/06/2015    Procedure: IRRIGATION AND DEBRIDEMENT EXTREMITY;  Surgeon: Gwyneth Revels, DPM;  Location: [facility] ORS;  Service: Podiatry;  Laterality: Left;  . Esophagogastroduodenoscopy N/A 04/07/2015    Procedure: ESOPHAGOGASTRODUODENOSCOPY (EGD);  Surgeon: Midge Minium, MD;  Location: [facility] ENDOSCOPY;  Service: Endoscopy;  Laterality: N/A;  . Foot surgery      No current outpatient prescriptions on file.  Allergies Sulfur    Patient reexamined:  Constitutional: Alert and oriented. Well appearing and in no acute distress.  Eyes:  Conjunctivae are normal. PERRL. EOMI. Head: Atraumatic.  Nose: No congestion/rhinnorhea.  Mouth/Throat: Mucous membranes are moist. Oropharynx non-erythematous. Neck: No stridor.  Cardiovascular: tachycardic, regular rhythm. Heart rate currently at 104.Grossly normal heart sounds. Good peripheral circulation. Respiratory: Normal respiratory effort. No retractions. Lungs CTAB. Gastrointestinal: Soft and nontender. No distention. Juan Roberson No CVA tenderness. Musculoskeletal:No lower or upper extremity tenderness nor edema. No joint effusions. Bilateral pedal pulses equal and easily palpated. No  cervical, thoracic or lumbar tenderness to palpation Except: left plantar foot pain, see skin below. Left calf nontender. NO swelling to left leg above left foot. Right leg nontender.  Neurologic: Normal speech and language. No gross focal neurologic deficits are appreciated. No gait instability. Skin: Skin is warm, dry and intact. No rash noted. Except: left plantar distal foot with mod swelling, mild TTP, minimal purulent drainage at break of skin and minimal erythema, no surrounding erythema, mild to mod dorsal left foot swelling. Bilateral pedal pulses equal and easily palpated.    LABS (all labs ordered are listed, but only abnormal results are displayed)  Labs Reviewed  BASIC METABOLIC PANEL - Abnormal; Notable for the following:    CO2 21 (*)    Glucose, Bld 131 (*)    BUN 35 (*)    Creatinine, Ser 1.78 (*)    GFR calc non Af Amer 44 (*)    GFR calc Af Amer 51 (*)    All other components within normal limits  CBC WITH DIFFERENTIAL/PLATELET - Abnormal; Notable for the following:    RBC 3.37 (*)    Hemoglobin 8.4 (*)    HCT 27.0 (*)    MCH 25.0 (*)    MCHC 31.2 (*)    All other components within normal limits  CULTURE, BLOOD (ROUTINE X 2)  CULTURE, BLOOD (ROUTINE X 2)  BASIC METABOLIC PANEL  CBC  HEMOGLOBIN A1C  PROTIME-INR  APTT      Radiology:   CLINICAL DATA: Injury on June 30th stepping on a nail. MRSA. Possible osteomyelitis. Deep vein thrombosis.  EXAM: MRI OF THE LEFT FOREFOOT WITHOUT CONTRAST  TECHNIQUE: Multiplanar, multisequence MR imaging was performed. No intravenous contrast was administered.  COMPARISON: 04/05/2015  FINDINGS: As abnormal osseous edema primarily in the proximal phalanx of the third toe but also to a lesser extent in the heads of the second, third, and fourth metatarsals. There is spurring at the Lisfranc joint without Lisfranc joint malalignment, and the Lisfranc ligament appears intact.  Dorsal subcutaneous edema along  the foot noted with edema tracking within along the plantar musculature of the foot. Focally confluent edema projects along the web space between the proximal phalanges of the third and fourth toes, in the vicinity of what appears to potentially be a puncture wound or draining sinus site on image 15 series 5 along the plantar ball of the foot.  Trace effusion of the middle subtalar facet.  IMPRESSION: 1. 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 Possible osteomyelitis involving the heads of the second, third, and fourth metatarsals. 2. Phlegmon and potentially incipient abscess tracking in the web space between the third and fourth proximal phalanges, with small locules of gas in the soft tissues in this vicinity, and extensive soft tissue edema. Possible draining sinus tract, correlate with site of puncture. 3. Dorsal subcutaneous edema and edema infiltrating the plantar musculature of the foot, likely representing cellulitis and myositis, respectively.   Electronically Signed By: Gaylyn Rong M.D. On: 05/18/2015 19:07   The current plan of care  is to await MRI.   1630: Patient reports current pain is 4 out of 10 to left foot. Resting calmly in bed. Chronic kidney disease, WBC 5.7, Labs reviewed. Awaiting MRI. Spoke with house supervisor in regards to wait time for MRI. Per house supervisor she will further evaluate MRI timing.  1700: House supervisor reports MRI will work to get patient in as soon as possible. Patient resting calmly in bed. Awaiting MRI.Denies need for pain medication.  1730: Patient to MRI.  1915: Returning from MRI, pt reports some nausea, zofran ordered. REviewed MRI result. Dr Alberteen Spindle podiatry paged. MRI showing osteomyelitis the proximal phalanx of the third toe with possible cellulitis involving the heads of the second, third, and fourth metatarsals. Phlegmon and potential incipient abscess tracking in the web space between third and  fourth proximal phalanges, with extensive soft tissue edema. Dorsal subcutaneous edema and edema infiltrating the plantar musculature of the foot. See MRI report.  1945: Reports nausea resolved, pain 1/10 to left foot.  Discussed patient with Dr. Alberteen Spindle podiatry. Dr. Alberteen Spindle recommends patient to be started on IV vancomycin and IV ciprofloxacin. Dr. Alberteen Spindle recommends patient to be admitted to hospitalist care and he will be consulted. Dr. Alberteen Spindle reports likely will take patient into the OR for debridement and cleaning of wound tomorrow. Hospitalist paged. Will admit for IV antibiotics and further podiatry management. 2000: Discussed patient with Dr. Imogene Burn hospitalist. Dr. Imogene Burn to admit patient. Care transferred to Dr. Imogene Burn.    Renford Dills, NP 05/18/15 1610  Loleta Rose, MD 05/18/15 [phone]

## 2015-05-19 ENCOUNTER — Encounter: Payer: Self-pay | Admitting: Anesthesiology

## 2015-05-19 ENCOUNTER — Encounter
Admission: EM | Disposition: A | Discharge status: Home Health | Payer: Self-pay | Source: Home / Self Care | Attending: Specialist

## 2015-05-19 LAB — HEMOGLOBIN A1C: Hgb A1c MFr Bld: 7.7 % — ABNORMAL HIGH (ref 4.0–6.0)

## 2015-05-19 LAB — CBC
HCT: 28.2 % — ABNORMAL LOW (ref 40.0–52.0)
Hemoglobin: 8.7 g/dL — ABNORMAL LOW (ref 13.0–18.0)
MCH: 24.8 pg — AB (ref 26.0–34.0)
MCHC: 31 g/dL — AB (ref 32.0–36.0)
MCV: 80 fL (ref 80.0–100.0)
PLATELETS: 175 10*3/uL (ref 150–440)
RBC: 3.52 MIL/uL — AB (ref 4.40–5.90)
RDW: 14.7 % — AB (ref 11.5–14.5)
WBC: 6.7 10*3/uL (ref 3.8–10.6)

## 2015-05-19 LAB — GLUCOSE, CAPILLARY
GLUCOSE-CAPILLARY: 113 mg/dL — AB (ref 65–99)
GLUCOSE-CAPILLARY: 96 mg/dL (ref 65–99)
GLUCOSE-CAPILLARY: 121 mg/dL — AB (ref 65–99)
Glucose-Capillary: 111 mg/dL — ABNORMAL HIGH (ref 65–99)

## 2015-05-19 LAB — BASIC METABOLIC PANEL
ANION GAP: 8 (ref 5–15)
BUN: 33 mg/dL — AB (ref 6–20)
CO2: 25 mmol/L (ref 22–32)
Calcium: 9.2 mg/dL (ref 8.9–10.3)
Chloride: 111 mmol/L (ref 101–111)
Creatinine, Ser: 1.98 mg/dL — ABNORMAL HIGH (ref 0.61–1.24)
GFR calc Af Amer: 45 mL/min — ABNORMAL LOW (ref >60)
GFR, EST NON AFRICAN AMERICAN: 39 mL/min — AB (ref >60)
Glucose, Bld: 110 mg/dL — ABNORMAL HIGH (ref 65–99)
POTASSIUM: 3.9 mmol/L (ref 3.5–5.1)
SODIUM: 144 mmol/L (ref 135–145)

## 2015-05-19 LAB — C-REACTIVE PROTEIN: CRP: 2.4 mg/dL — ABNORMAL HIGH (ref <1.0)

## 2015-05-19 LAB — APTT: APTT: 26 s (ref 24–36)

## 2015-05-19 LAB — PROTIME-INR
INR: 2.08
Prothrombin Time: 23.5 seconds — ABNORMAL HIGH (ref 11.4–15.0)

## 2015-05-19 SURGERY — IRRIGATION AND DEBRIDEMENT FOOT
Anesthesia: Choice | Laterality: Left

## 2015-05-19 SURGICAL SUPPLY — 46 items
BAG COUNTER SPONGE EZ (MISCELLANEOUS) IMPLANT
BANDAGE ELASTIC 4 CLIP NS LF (GAUZE/BANDAGES/DRESSINGS) IMPLANT
BLADE OSCILLATING/SAGITTAL (BLADE)
BLADE SURG 15 STRL LF DISP TIS (BLADE) IMPLANT
BLADE SURG 15 STRL SS (BLADE)
BLADE SW THK.38XMED LNG THN (BLADE) IMPLANT
BNDG ESMARK 4X12 TAN STRL LF (GAUZE/BANDAGES/DRESSINGS) IMPLANT
BNDG GAUZE 4.5X4.1 6PLY STRL (MISCELLANEOUS) IMPLANT
CANISTER SUCT 1200ML W/VALVE (MISCELLANEOUS) IMPLANT
COUNTER SPONGE BAG EZ (MISCELLANEOUS)
CUFF TOURN 18 STER (MISCELLANEOUS) IMPLANT
CUFF TOURN DUAL PL 12 NO SLV (MISCELLANEOUS) IMPLANT
DRAPE FLUOR MINI C-ARM 54X84 (DRAPES) IMPLANT
DURAPREP 26ML APPLICATOR (WOUND CARE) IMPLANT
GAUZE FLUFF 18X24 1PLY STRL (GAUZE/BANDAGES/DRESSINGS) IMPLANT
GAUZE PETRO XEROFOAM 1X8 (MISCELLANEOUS) IMPLANT
GAUZE SPONGE 4X4 12PLY STRL (GAUZE/BANDAGES/DRESSINGS) IMPLANT
GAUZE STRETCH 2X75IN STRL (MISCELLANEOUS) IMPLANT
GLOVE BIO SURGEON STRL SZ7.5 (GLOVE) IMPLANT
GLOVE INDICATOR 8.0 STRL GRN (GLOVE) IMPLANT
GOWN STRL REUS W/ TWL LRG LVL3 (GOWN DISPOSABLE) IMPLANT
GOWN STRL REUS W/TWL LRG LVL3 (GOWN DISPOSABLE)
HANDPIECE VERSAJET DEBRIDEMENT (MISCELLANEOUS) IMPLANT
LABEL OR SOLS (LABEL) IMPLANT
NDL SAFETY 25GX1.5 (NEEDLE) IMPLANT
NEEDLE FILTER BLUNT 18X 1/2SAF (NEEDLE)
NEEDLE FILTER BLUNT 18X1 1/2 (NEEDLE) IMPLANT
NS IRRIG 500ML POUR BTL (IV SOLUTION) IMPLANT
PACK EXTREMITY ARMC (MISCELLANEOUS) IMPLANT
PAD ABD DERMACEA PRESS 5X9 (GAUZE/BANDAGES/DRESSINGS) IMPLANT
PAD GROUND ADULT SPLIT (MISCELLANEOUS) IMPLANT
PENCIL ELECTRO HAND CTR (MISCELLANEOUS) IMPLANT
RASP SM TEAR CROSS CUT (RASP) IMPLANT
SOL PREP PVP 2OZ (MISCELLANEOUS)
SOLUTION PREP PVP 2OZ (MISCELLANEOUS) IMPLANT
STOCKINETTE STRL 4IN 9604848 (GAUZE/BANDAGES/DRESSINGS) IMPLANT
STOCKINETTE STRL 6IN 960660 (GAUZE/BANDAGES/DRESSINGS) IMPLANT
STRAP SAFETY BODY (MISCELLANEOUS) IMPLANT
SUT ETHILON 4-0 (SUTURE)
SUT ETHILON 4-0 FS2 18XMFL BLK (SUTURE)
SUT VIC AB 3-0 SH 27 (SUTURE)
SUT VIC AB 3-0 SH 27X BRD (SUTURE) IMPLANT
SUT VIC AB 4-0 FS2 27 (SUTURE) IMPLANT
SUTURE ETHLN 4-0 FS2 18XMF BLK (SUTURE) IMPLANT
SYR 3ML LL SCALE MARK (SYRINGE) IMPLANT
SYRINGE 10CC LL (SYRINGE) IMPLANT

## 2015-05-19 NOTE — Progress Notes (Signed)
Juan Roberson admitted to floor with manual BP 195/100, Dr Clint Guy, MD on call notified, PRN hydralazine ordered. Also, spoke with Dr Clint Guy about amlodipine, clonidine, carvedilol, and hydralazine all scheduled at bedtime. Juan Roberson states that he only takes clonidine for BP currently at home, Dr Clint Guy stated to only give clonidine. Juan Roberson is to have surgery tomorrow, spoke with Dr Alberteen Spindle about holding HS Lantus, Dr Alberteen Spindle stated to administer as ordered. Nursing continues to monitor.

## 2015-05-19 NOTE — Progress Notes (Signed)
Kile to have surgery later today, PT is 23.5, Dr Clint Guy, MD on call notified. Nursing continues to monitor.

## 2015-05-19 NOTE — Progress Notes (Signed)
[facility] [place] [facility]   PATIENT NAME: Juan Roberson    MR#:  [id number]  DATE OF BIRTH:  September 02, 1971  SUBJECTIVE:  CHIEF COMPLAINT:   Chief Complaint  Patient presents with  . Foot Pain   Patient here with left foot pain and swelling and MRI findings suggestive of osteomyelitis on the left third toe. Patient still has some pain in the left foot, but no fever.    REVIEW OF SYSTEMS:    Review of Systems  Constitutional: Negative for fever and chills.  HENT: Negative for congestion and tinnitus.   Eyes: Negative for blurred vision and double vision.  Respiratory: Negative for cough, shortness of breath and wheezing.   Cardiovascular: Negative for chest pain, orthopnea and PND.  Gastrointestinal: Negative for nausea, vomiting, abdominal pain and diarrhea.  Genitourinary: Negative for dysuria and hematuria.  Musculoskeletal: Positive for joint pain (left foot pain).  Neurological: Negative for dizziness, sensory change and focal weakness.  All other systems reviewed and are negative.   Nutrition: Diabetic Tolerating Diet: Yes Tolerating PT: Ambulatory  DRUG ALLERGIES:   Allergies  Allergen Reactions  . Sulfur Other (See Comments)    Renal failure    VITALS:  Blood pressure 161/86, pulse 110, temperature 98.9 F (37.2 C), temperature source Oral, resp. rate 18, height  (1.905 m), weight 88.678 kg (195 lb 8 oz), SpO2 97 %.  PHYSICAL EXAMINATION:   Physical Exam  GENERAL:  44 y.o.-year-old patient lying in the bed with no acute distress.  EYES: Pupils equal, round, reactive to light and accommodation. No scleral icterus. Extraocular muscles intact.  HEENT: Head atraumatic, normocephalic. Oropharynx and nasopharynx clear.  NECK:  Supple, no jugular venous distention. No thyroid enlargement, no tenderness.  LUNGS: Normal breath sounds bilaterally, no wheezing, rales, rhonchi. No use of accessory muscles of respiration.   CARDIOVASCULAR: S1, S2 normal. No murmurs, rubs, or gallops.  ABDOMEN: Soft, nontender, nondistended. Bowel sounds present. No organomegaly or mass.  EXTREMITIES: No cyanosis, clubbing or edema b/l.  Left plantar foot swelling, redness.   NEUROLOGIC: Cranial nerves II through XII are intact. No focal Motor or sensory deficits b/l.   PSYCHIATRIC: The patient is alert and oriented x 3. Good affect SKIN: No obvious rash, lesion, or ulcer.    LABORATORY PANEL:   CBC  Recent Labs Lab 05/19/15 0514  WBC 6.7  HGB 8.7*  HCT 28.2*  PLT 175   ------------------------------------------------------------------------------------------------------------------  Chemistries   Recent Labs Lab 05/19/15 0514  NA 144  K 3.9  CL 111  CO2 25  GLUCOSE 110*  BUN 33*  CREATININE 1.98*  CALCIUM 9.2   ------------------------------------------------------------------------------------------------------------------  Cardiac Enzymes No results for input(s): TROPONINI in the last 168 hours. ------------------------------------------------------------------------------------------------------------------  RADIOLOGY:  Mr Foot Left Wo Contrast  05/18/2015   CLINICAL DATA:  Injury on June 30th stepping on a nail. MRSA. Possible osteomyelitis. Deep vein thrombosis.  EXAM: MRI OF THE LEFT FOREFOOT WITHOUT CONTRAST  TECHNIQUE: Multiplanar, multisequence MR imaging was performed. No intravenous contrast was administered.  COMPARISON:  04/05/2015  FINDINGS: As abnormal osseous edema primarily in the proximal phalanx of the third toe but also to a lesser extent in the heads of the second, third, and fourth metatarsals. There is spurring at the Lisfranc joint without Lisfranc joint malalignment, and the Lisfranc ligament appears intact.  Dorsal subcutaneous edema along the foot noted with edema tracking within along the plantar musculature of the foot. Focally confluent edema projects along the  web space  between the proximal phalanges of the third and fourth toes, in the vicinity of what appears to potentially be a puncture wound or draining sinus site on image 15 series 5 along the plantar ball of the foot.  Trace effusion of the middle subtalar facet.  IMPRESSION: 1. 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 Possible osteomyelitis involving the heads of the second, third, and fourth metatarsals. 2. Phlegmon and potentially incipient abscess tracking in the web space between the third and fourth proximal phalanges, with small locules of gas in the soft tissues in this vicinity, and extensive soft tissue edema. Possible draining sinus tract, correlate with site of puncture. 3. Dorsal subcutaneous edema and edema infiltrating the plantar musculature of the foot, likely representing cellulitis and myositis, respectively.   Electronically Signed   By: Gaylyn Rong M.D.   On: 05/18/2015 19:07     ASSESSMENT AND PLAN:   44 year old male with history of diabetes, hypertension, chronic kidney disease stage III, GERD, history of left foot cellulitis who presents to the hospital but the left foot swelling and redness and pain.  #1 left foot redness swelling and pain-this is likely secondary to osteomyelitis/abscess formation. -Patient had MRI of the left foot showing osteomyelitis of the proximal phalanx of the third toe. There is also phlegmon or abscess tracking in the web space between the third and fourth proximal phalanges. -Continue supportive care with IV antibiotics with vancomycin, Cipro. -Patient has been seen by infectious disease, podiatry and they both recommend surgical debridement. -Patient likely will need long-term IV antibiotic therapy after surgical debridement. -There is no acute need for orthopedic evaluation at this time.  #2 chronic kidney disease stage III-patient's creatinine is close to baseline. We'll continue to monitor renal function. -No acute need for  nephrology consultation at this time.  #3 hypertension-continue clonidine, Coreg, amlodipine.  #4 diabetic neuropathy-continue Neurontin, Lyrica.  #5 diabetes with renal complication-continue Lantus, sliding scale insulin.  #6 GERD-continue Protonix.  It's in the patient's best interest to get surgical debridement done first prior to initiating any long-term IV antibiotic therapy.  All the records are reviewed and case discussed with Care Management/Social Workerr. Management plans discussed with the patient, family and they are in agreement.  CODE STATUS: Full code  DVT Prophylaxis: Ambulatory  TOTAL TIME TAKING CARE OF THIS PATIENT: 35 minutes.   POSSIBLE D/C IN 2-3 DAYS, DEPENDING ON CLINICAL CONDITION.   Houston Siren M.D on 05/19/2015 at 3:21 PM  Between 7am to 6pm - Pager - [phone]  After 6pm go to [email or web address] - password [email or web address] [facility]  [facility]  Office  [phone]  CC: Primary care physician; Corky Downs, MD

## 2015-05-19 NOTE — Care Management Note (Addendum)
Case Management Note  Patient Details  Name: Juan Roberson. MRN: [id number] Date of Birth: 05/12/1971  Subjective/Objective:                  DM RN requested from RN. Patient states that he had foot injury in June (30th) at work and now having more complications related to that injury. He has required no DME to ambulate. His PCP is Dr. Juel Burrow. He states he is drawing $340/week on workers' comp. He believes that he still has [employer] coverage although he has not worked.  He expressed concern that his Rx may not get paid for if he loses his health insurance. 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 He states he and his daughter Philippa Chester are able to drive. He uses [facility] for Rx.   Action/Plan: I have left a message for [employer] Mahlon Gammon adjustor with his Workers' Compensation [phone] so that she may assist [place] with discharge planning. Surgery today. RNCM will continue to follow. 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 Patient may benefit from Vocational Rehabilitation especially if his job is lost.   Received callback 05/19/15 11:01AM from case worker Debby Bud [phone]. She is willing to assist with patient discharge planning.  05/19/15 at 11:44AM: received callback from [facility] again stating Case # [id number]. Surgery has not been approved (planned for today). I have faxed medical record per Dawn's request for review. She will process this information with patient's case adjustor Ms. Fitzpatrick.  05/19/15 at 1346: Received call from [facility] asking for ID before patient has surgery performed per Ms. Fitzpatrick. I called Ms. Fitzpatrick (adjustor) back stating that osteomyelitis has been diagnosed on MRI and the surgery has been arranged for 1700 today. She insists that "we save his foot and get infectious disease (ID) involved". At 1352 I spoke with Dr. Linus Galas podiatry and he will have  infectious disease consulted today. Per Dr. Alberteen Spindle he plans to "clean the infection out of patient's foot and may not need to take the toe off". I explained to Ms. Luan Pulling that it is osteo therefore the infection is in the bone. She still wants infectious disease involved. I explained the risks with delaying surgery to Ms. Fitzpatrick such as sepsis, organ damage, and increased risk of more tissue/bone loss without proceeding with surgery. She acknowledges; again requests ID.  04/18/15 1358: Ms. Luan Pulling called back (while I was on the phone with Dr. Alberteen Spindle) and wants ortho also involved. Dr. Alberteen Spindle notified. Surgery cancelled per Ms. Fitzpatrick request to "save his foot".  I have paged Dr. Cherlynn Kaiser to update and request nephro because of renal disease and  increased risk of damage to renal with aggressive/long-term antibiotics.   05/19/15 at 1412: Spoke with Dr. Sampson Goon with infectious disease and he will see patient today.   05/19/15 at 1533: Faxed note from Dr. Sampson Goon with ID- recommends proceeding with surgery. Ortho declined as this is podiatry speciality per Dr. Cherlynn Kaiser. Renal consult pending based on renal labs per Dr. Cherlynn Kaiser- not ordered at this time. Surgery spot lost for today at 1700; Per Dr.Cline with podiatry he may be able to fit patient in for Friday on surgical schedule.   05/20/15 1519: Visit from [facility] here at [facility] she apparently works for [employer] and not directly with [employer]. I gave her list of home health agencies that cover this area. She is also aware  that patient may need PICC and IV antibiotics at discharge.    Expected Discharge Date:                  Expected Discharge Plan:     In-House Referral:     Discharge planning Services  CM Consult  Post Acute Care Choice:    Choice offered to:  Patient, Adult Children (daughter Education officer, community)  DME Arranged:    DME Agency:     HH Arranged:    HH Agency:     Status of Service:  In process, will  continue to follow  Medicare Important Message Given:    Date Medicare IM Given:    Medicare IM give by:    Date Additional Medicare IM Given:    Additional Medicare Important Message give by:     If discussed at Long Length of Stay Meetings, dates discussed:    Additional Comments:  Collie Siad, RN 05/19/2015, 9:43 AM

## 2015-05-19 NOTE — Progress Notes (Signed)
There are several meds that the pt said he no longer takes.  Dr Cherlynn Kaiser said that it would be okay to discontinue these orders

## 2015-05-19 NOTE — Consult Note (Signed)
[facility] Infectious Disease     Reason for Consult: Foot osteomyelitis    Referring Physician: Dr Cleda Mccreedy Date of Admission:  05/18/2015   Principal Problem:   Osteomyelitis of ankle or foot, acute Active Problems:   Foot abscess, left   HPI: Juan Roberson. is a 44 y.o. male with hx DM and CKD who injured his L foot when he stepped on a screw at work on June 30th, He had to have I and D of the wound July 5th and has been treated as otpt with oral antibiotics however the wound has worsened.  There was some concern for osteomyelitis and MRI done revealed osteomyelitis in the third toe with continued abscess in the forefoot.  He developed NV and was admitted for IV abx and surgery. We care consulted for further abx management He had cultures done as otpt which grew Juny 4th MSSA and Klebsiella. Current wound cx with MRSA and Group B strep.  Currently foot is warm, red and has open wound.  Has been on vancomycin. He is bactrim allergic.   Past Medical History  Diagnosis Date  . Diabetes mellitus without complication   . Hypertension   . Chronic kidney disease   . GERD (gastroesophageal reflux disease)   . Cellulitis and abscess of foot     Left-Dr. Vickki Muff   Past Surgical History  Procedure Laterality Date  . No past surgeries    . I&d extremity Left 04/06/2015    Procedure: IRRIGATION AND DEBRIDEMENT EXTREMITY;  Surgeon: Samara Deist, DPM;  Location: [facility] ORS;  Service: Podiatry;  Laterality: Left;  . Esophagogastroduodenoscopy N/A 04/07/2015    Procedure: ESOPHAGOGASTRODUODENOSCOPY (EGD);  Surgeon: Lucilla Lame, MD;  Location: [facility] ENDOSCOPY;  Service: Endoscopy;  Laterality: N/A;  . Foot surgery     Social History  Substance Use Topics  . Smoking status: Never Smoker   . Smokeless tobacco: None  . Alcohol Use: No   Family History  Problem Relation Age of Onset  . Coronary artery disease Father   . Pancreatic cancer Mother   . Breast cancer Sister   . Lung cancer Brother    . Pancreatic cancer Mother     Allergies:  Allergies  Allergen Reactions  . Sulfur Other (See Comments)    Renal failure    Current antibiotics: Antibiotics Given (last 72 hours)    Date/Time Action Medication Dose Rate   05/18/15 2251 Given   vancomycin (VANCOCIN) IVPB 1000 mg/200 mL premix 1,000 mg 200 mL/hr   05/19/15 0537 Given   vancomycin (VANCOCIN) IVPB 1000 mg/200 mL premix 1,000 mg 200 mL/hr   05/19/15 0818 Given   ciprofloxacin (CIPRO) IVPB 400 mg 400 mg 200 mL/hr      MEDICATIONS: . amLODipine  10 mg Oral Daily  . carvedilol  50 mg Oral BID  . ciprofloxacin  400 mg Intravenous Q12H  . cloNIDine  0.2 mg Oral Daily  . DULoxetine  30 mg Oral BID  . furosemide  40 mg Oral Daily  . gabapentin  300 mg Oral Daily  . hydrALAZINE  100 mg Oral TID  . HYDROcodone-acetaminophen  1 tablet Oral BID  . insulin aspart  0-5 Units Subcutaneous QHS  . insulin aspart  0-9 Units Subcutaneous TID WC  . insulin glargine  16 Units Subcutaneous Q2200  . pantoprazole  40 mg Oral Daily  . patiromer  8.4 g Oral Daily  . pregabalin  75 mg Oral TID  . topiramate  25 mg Oral  BID  . vancomycin  1,000 mg Intravenous Q12H    Review of Systems - 11 systems reviewed and negative per HPI   OBJECTIVE: Temp:  [98.3 F (36.8 C)-99.6 F (37.6 C)] 98.9 F (37.2 C) (08/17 0752) Pulse Rate:  [58-118] 110 (08/17 0752) Resp:  [16-19] 18 (08/17 0752) BP: (161-195)/(70-113) 161/86 mmHg (08/17 0752) SpO2:  [97 %-100 %] 97 % (08/17 0752) Weight:  [88.678 kg (195 lb 8 oz)-90.719 kg (200 lb)] 88.678 kg (195 lb 8 oz) (08/16 2141) Physical Exam  Constitutional: He is oriented to person, place, and time. He appears well-developed and well-nourished. No distress.  HENT: Mouth/Throat: Oropharynx is clear and moist. No oropharyngeal exudate.  Cardiovascular: Normal rate, regular rhythm and normal heart sounds. Pulmonary/Chest: Effort normal and breath sounds normal. No respiratory distress. He has no  wheezes.  Abdominal: Soft. Bowel sounds are normal. He exhibits no distension. There is no tenderness.  Lymphadenopathy: He has no cervical adenopathy.  Neurological: He is alert and oriented to person, place, and time.  Ext L forefoot is 1+ edema, mod erythema, very warm to touch. Has an open wound on plantar surface with mild drainage Psychiatric: He has a normal mood and affect. His behavior is normal.   LABS: Results for orders placed or performed during the hospital encounter of 05/18/15 (from the past 48 hour(s))  Basic metabolic panel     Status: Abnormal   Collection Time: 05/18/15  1:57 PM  Result Value Ref Range   Sodium 142 135 - 145 mmol/L   Potassium 3.9 3.5 - 5.1 mmol/L   Chloride 111 101 - 111 mmol/L   CO2 21 (L) 22 - 32 mmol/L   Glucose, Bld 131 (H) 65 - 99 mg/dL   BUN 35 (H) 6 - 20 mg/dL   Creatinine, Ser 1.78 (H) 0.61 - 1.24 mg/dL   Calcium 9.5 8.9 - 10.3 mg/dL   GFR calc non Af Amer 44 (L) >60 mL/min   GFR calc Af Amer 51 (L) >60 mL/min    Comment: (NOTE) The eGFR has been calculated using the CKD EPI equation. This calculation has not been validated in all clinical situations. eGFR's persistently <60 mL/min signify possible Chronic Kidney Disease.    Anion gap 10 5 - 15  CBC with Differential     Status: Abnormal   Collection Time: 05/18/15  1:57 PM  Result Value Ref Range   WBC 5.7 3.8 - 10.6 K/uL   RBC 3.37 (L) 4.40 - 5.90 MIL/uL   Hemoglobin 8.4 (L) 13.0 - 18.0 g/dL   HCT 27.0 (L) 40.0 - 52.0 %   MCV 80.2 80.0 - 100.0 fL   MCH 25.0 (L) 26.0 - 34.0 pg   MCHC 31.2 (L) 32.0 - 36.0 g/dL   RDW 14.5 11.5 - 14.5 %   Platelets 165 150 - 440 K/uL   Neutrophils Relative % 70 %   Neutro Abs 4.0 1.4 - 6.5 K/uL   Lymphocytes Relative 20 %   Lymphs Abs 1.1 1.0 - 3.6 K/uL   Monocytes Relative 8 %   Monocytes Absolute 0.4 0.2 - 1.0 K/uL   Eosinophils Relative 2 %   Eosinophils Absolute 0.1 0 - 0.7 K/uL   Basophils Relative 0 %   Basophils Absolute 0.0 0 - 0.1  K/uL  Blood culture (routine x 2)     Status: None (Preliminary result)   Collection Time: 05/18/15  8:01 PM  Result Value Ref Range   Specimen Description BLOOD RIGHT ARM  Special Requests BOTTLES DRAWN AEROBIC AND ANAEROBIC 2CC    Culture NO GROWTH < 12 HOURS    Report Status PENDING   Blood culture (routine x 2)     Status: None (Preliminary result)   Collection Time: 05/18/15  8:01 PM  Result Value Ref Range   Specimen Description BLOOD LEFT HAND    Special Requests BOTTLES DRAWN AEROBIC AND ANAEROBIC 3CC    Culture NO GROWTH < 12 HOURS    Report Status PENDING   Glucose, capillary     Status: Abnormal   Collection Time: 05/18/15  9:32 PM  Result Value Ref Range   Glucose-Capillary 111 (H) 65 - 99 mg/dL  Hemoglobin Q1A     Status: Abnormal   Collection Time: 05/18/15 10:05 PM  Result Value Ref Range   Hgb A1c MFr Bld 7.7 (H) 4.0 - 6.0 %  Protime-INR     Status: Abnormal   Collection Time: 05/19/15 12:40 AM  Result Value Ref Range   Prothrombin Time 23.5 (H) 11.4 - 15.0 seconds   INR 2.08   APTT     Status: None   Collection Time: 05/19/15 12:40 AM  Result Value Ref Range   aPTT 26 24 - 36 seconds  Basic metabolic panel     Status: Abnormal   Collection Time: 05/19/15  5:14 AM  Result Value Ref Range   Sodium 144 135 - 145 mmol/L   Potassium 3.9 3.5 - 5.1 mmol/L   Chloride 111 101 - 111 mmol/L   CO2 25 22 - 32 mmol/L   Glucose, Bld 110 (H) 65 - 99 mg/dL   BUN 33 (H) 6 - 20 mg/dL   Creatinine, Ser 2.76 (H) 0.61 - 1.24 mg/dL   Calcium 9.2 8.9 - 36.6 mg/dL   GFR calc non Af Amer 39 (L) >60 mL/min   GFR calc Af Amer 45 (L) >60 mL/min    Comment: (NOTE) The eGFR has been calculated using the CKD EPI equation. This calculation has not been validated in all clinical situations. eGFR's persistently <60 mL/min signify possible Chronic Kidney Disease.    Anion gap 8 5 - 15  CBC     Status: Abnormal   Collection Time: 05/19/15  5:14 AM  Result Value Ref Range   WBC  6.7 3.8 - 10.6 K/uL   RBC 3.52 (L) 4.40 - 5.90 MIL/uL   Hemoglobin 8.7 (L) 13.0 - 18.0 g/dL   HCT 16.6 (L) 63.3 - 89.7 %   MCV 80.0 80.0 - 100.0 fL   MCH 24.8 (L) 26.0 - 34.0 pg   MCHC 31.0 (L) 32.0 - 36.0 g/dL   RDW 72.9 (H) 25.1 - 83.6 %   Platelets 175 150 - 440 K/uL  Glucose, capillary     Status: Abnormal   Collection Time: 05/19/15  7:51 AM  Result Value Ref Range   Glucose-Capillary 111 (H) 65 - 99 mg/dL   Comment 1 Notify RN   Glucose, capillary     Status: Abnormal   Collection Time: 05/19/15 11:35 AM  Result Value Ref Range   Glucose-Capillary 113 (H) 65 - 99 mg/dL   Comment 1 Notify RN    No components found for: ESR, C REACTIVE PROTEIN MICRO: Recent Results (from the past 720 hour(s))  Wound culture     Status: None   Collection Time: 05/13/15  2:50 AM  Result Value Ref Range Status   Specimen Description FOOT  Final   Special Requests NONE  Final  Gram Stain   Final    FEW WBC SEEN FEW GRAM POSITIVE COCCI RARE GRAM NEGATIVE RODS    Culture   Final    MODERATE GROWTH METHICILLIN RESISTANT STAPHYLOCOCCUS AUREUS MODERATE GROWTH GROUP B STREP(S.AGALACTIAE)ISOLATED CRITICAL RESULT CALLED TO, READ BACK BY AND VERIFIED WITH: [place] 1194 05/17/15 CTJ Virtually 100% of S. agalactiae (Group B) strains are susceptible to Penicillin.  For Penicillin-allergic patients, Erythromycin (85-95% sensitive) and Clindamycin (80% sensitive) are drugs of choice. Contact microbiology lab to request sensitivities if  needed within 7 days.    Report Status 05/18/2015 FINAL  Final   Organism ID, Bacteria METHICILLIN RESISTANT STAPHYLOCOCCUS AUREUS  Final      Susceptibility   Methicillin resistant staphylococcus aureus - MIC*    CIPROFLOXACIN >=8 RESISTANT Resistant     GENTAMICIN <=0.5 SENSITIVE Sensitive     OXACILLIN Value in next row Resistant      >=4 MODERATELY RESISTANT    VANCOMYCIN <=0.5 SENSITIVE Sensitive     TRIMETH/SULFA <=10 SENSITIVE Sensitive     CEFOXITIN  SCREEN Value in next row Resistant      POSITIVECEFOXITIN SCREEN - This test may be used to predict mecA-mediated oxacillin resistance, and it is based on the cefoxitin disk screen test.  The cefoxitin screen and oxacillin work in combination to determine the final interpretation reported for oxacillin.     Inducible Clindamycin Value in next row Sensitive      POSITIVECEFOXITIN SCREEN - This test may be used to predict mecA-mediated oxacillin resistance, and it is based on the cefoxitin disk screen test.  The cefoxitin screen and oxacillin work in combination to determine the final interpretation reported for oxacillin.     ERYTHROMYCIN Value in next row Resistant      RESISTANT>=8    TETRACYCLINE Value in next row Sensitive      SENSITIVE<=1    CLINDAMYCIN Value in next row Sensitive      SENSITIVE<=0.25    LINEZOLID Value in next row Sensitive      SENSITIVE2    * MODERATE GROWTH METHICILLIN RESISTANT STAPHYLOCOCCUS AUREUS  Anaerobic culture     Status: None (Preliminary result)   Collection Time: 05/13/15  2:50 AM  Result Value Ref Range Status   Specimen Description FOOT  Final   Special Requests NONE  Final   Culture HOLDING FOR POSSIBLE ANAEROBE  Final   Report Status PENDING  Incomplete  Blood culture (routine x 2)     Status: None (Preliminary result)   Collection Time: 05/18/15  8:01 PM  Result Value Ref Range Status   Specimen Description BLOOD RIGHT ARM  Final   Special Requests BOTTLES DRAWN AEROBIC AND ANAEROBIC 2CC  Final   Culture NO GROWTH < 12 HOURS  Final   Report Status PENDING  Incomplete  Blood culture (routine x 2)     Status: None (Preliminary result)   Collection Time: 05/18/15  8:01 PM  Result Value Ref Range Status   Specimen Description BLOOD LEFT HAND  Final   Special Requests BOTTLES DRAWN AEROBIC AND ANAEROBIC 3CC  Final   Culture NO GROWTH < 12 HOURS  Final   Report Status PENDING  Incomplete   July 4th Specimen Description FOOT   Special Requests  Immunocompromised   Gram Stain FEW WBC SEEN  MODERATE GRAM POSITIVE COCCI IN PAIRS IN CLUSTERS  FEW GRAM NEGATIVE RODS  FEW GRAM VARIABLE ROD       Culture HEAVY GROWTH STAPHYLOCOCCUS AUREUS  LIGHT  GROWTH KLEBSIELLA PNEUMONIAE       Report Status 04/08/2015 FINAL   Organism ID, Bacteria STAPHYLOCOCCUS AUREUS   Organism ID, Bacteria KLEBSIELLA PNEUMONIAE   Resulting Agency [facility]    Culture & Susceptibility      KLEBSIELLA PNEUMONIAE     Antibiotic Sensitivity Microscan Status    AMPICILLIN Resistant >=32 RESISTANT Final    Method: MIC    CEFAZOLIN Sensitive <=4 SENSITIVE Final    Method: MIC    CEFOXITIN Sensitive <=4 SENSITIVE Final    Method: MIC    CEFTRIAXONE Sensitive <=1 SENSITIVE Final    Method: MIC    CIPROFLOXACIN Sensitive <=0.25 SENSITIVE Final    Method: MIC    GENTAMICIN Sensitive <=1 SENSITIVE Final    Method: MIC    IMIPENEM Sensitive <=0.25 SENSITIVE Final    Method: MIC    NITROFURANTOIN Intermediate 64 INTERMEDIATE Final    Method: MIC    TRIMETH/SULFA Sensitive <=20 SENSITIVE Final    Method: MIC    Comments KLEBSIELLA PNEUMONIAE (MIC)    LIGHT GROWTH KLEBSIELLA PNEUMONIAE             STAPHYLOCOCCUS AUREUS     Antibiotic Sensitivity Microscan Status    CEFOXITIN SCREEN Sensitive NEGATIVE Final    Method: MIC    CIPROFLOXACIN Sensitive <=0.5 SENSITIVE Final    Method: MIC    CLINDAMYCIN Sensitive SENSITIVE <=0.25 Final    Method: MIC    GENTAMICIN Sensitive <=0.5 SENSITIVE Final    Method: MIC    Inducible Clindamycin Sensitive NEGATIVE Final    Method: MIC    LEVOFLOXACIN Sensitive SENSITIVE <=0.12 Final    Method: MIC    OXACILLIN Sensitive <=0.25 SENSITIVE Final    Method: MIC    TETRACYCLINE Sensitive SENSITIVE <=1 Final    Method: MIC    TRIMETH/SULFA Sensitive <=10 SENSITIVE Final    Method: MIC    Comments STAPHYLOCOCCUS AUREUS  (MIC)    HEAVY GROWTH STAPHYLOCOCCUS AUREUS                  IMAGING: Mr Foot Left Wo Contrast  05/18/2015   CLINICAL DATA:  Injury on June 30th stepping on a nail. MRSA. Possible osteomyelitis. Deep vein thrombosis.  EXAM: MRI OF THE LEFT FOREFOOT WITHOUT CONTRAST  TECHNIQUE: Multiplanar, multisequence MR imaging was performed. No intravenous contrast was administered.  COMPARISON:  04/05/2015  FINDINGS: As abnormal osseous edema primarily in the proximal phalanx of the third toe but also to a lesser extent in the heads of the second, third, and fourth metatarsals. There is spurring at the Lisfranc joint without Lisfranc joint malalignment, and the Lisfranc ligament appears intact.  Dorsal subcutaneous edema along the foot noted with edema tracking within along the plantar musculature of the foot. Focally confluent edema projects along the web space between the proximal phalanges of the third and fourth toes, in the vicinity of what appears to potentially be a puncture wound or draining sinus site on image 15 series 5 along the plantar ball of the foot.  Trace effusion of the middle subtalar facet.  IMPRESSION: 1. 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 Possible osteomyelitis involving the heads of the second, third, and fourth metatarsals. 2. Phlegmon and potentially incipient abscess tracking in the web space between the third and fourth proximal phalanges, with small locules of gas in the soft tissues in this vicinity, and extensive soft tissue edema. Possible draining sinus tract, correlate with site of puncture. 3. Dorsal subcutaneous  edema and edema infiltrating the plantar musculature of the foot, likely representing cellulitis and myositis, respectively.   Electronically Signed   By: Van Clines M.D.   On: 05/18/2015 19:07   US Venous Img Lower Unilateral Left  05/13/2015   CLINICAL DATA:  44 year old male with a history of left lower extremity pain  EXAM: LEFT  LOWER EXTREMITY VENOUS DOPPLER ULTRASOUND  TECHNIQUE: Gray-scale sonography with graded compression, as well as color Doppler and duplex ultrasound were performed to evaluate the lower extremity deep venous systems from the level of the common femoral vein and including the common femoral, femoral, profunda femoral, popliteal and calf veins including the posterior tibial, peroneal and gastrocnemius veins when visible. The superficial great saphenous vein was also interrogated. Spectral Doppler was utilized to evaluate flow at rest and with distal augmentation maneuvers in the common femoral, femoral and popliteal veins.  COMPARISON:  None.  FINDINGS: Contralateral Common Femoral Vein: Respiratory phasicity is normal and symmetric with the symptomatic side. No evidence of thrombus. Normal compressibility.  Common Femoral Vein: No evidence of thrombus. Normal compressibility, respiratory phasicity and response to augmentation.  Saphenofemoral Junction: No evidence of thrombus. Normal compressibility and flow on color Doppler imaging.  Profunda Femoral Vein: No evidence of thrombus. Normal compressibility and flow on color Doppler imaging.  Femoral Vein: No evidence of thrombus. Normal compressibility, respiratory phasicity and response to augmentation.  Popliteal Vein: No evidence of thrombus. Normal compressibility, respiratory phasicity and response to augmentation.  Calf Veins: Posterior tibial vein patent, with no thrombus, retained flow, incomplete compressibility.  The left peroneal vein demonstrates occlusive thrombus.  Superficial Great Saphenous Vein: No evidence of thrombus. Normal compressibility and flow on color Doppler imaging.  Venous Reflux:  None.  Other Findings: Superficial vein at the knee/upper calf thrombosed. It is uncertain if this communicates directly with the great saphenous vein or small saphenous vein.  IMPRESSION: Sonographic survey of the left lower extremity is positive for DVT, with  occlusive thrombus in the left peroneal vein, which does not extend into the popliteal vein.  Sonographic survey also positive for superficial thrombophlebitis, with thrombosed vein at the level of the knee/upper calf.  Signed,  Dulcy Fanny. Earleen Newport, DO  [facility]  [facility]   Electronically Signed   By: Corrie Mckusick D.O.   On: 05/13/2015 17:18    Assessment:   Juan Roberson. is a 44 y.o. male with hx DM and CKD who injured his L foot when he stepped on a screw at work on June 30th, He had to have I and D of the wound July 5th and has been treated as otpt with oral antibiotics however the wound has worsened.  There was some concern for osteomyelitis and MRI done revealed osteomyelitis in the third toe with continued abscess in the forefoot.  He developed NV and was admitted for IV abx and surgery. We care consulted for further abx management He had cultures done as otpt which grew Juny 4th MSSA and Klebsiella. Current wound cx with MRSA and Group B strep.  Recommendations He needs surgery to debride infected bone and abscess drainage Cont vanco Add cipro as may have gram negatives as well since Klebsiella noted before. Will likley need picc for 2-4 week IV vanco after surgical debridement Discussed with patient and case manage  Thank you very much for allowing me to participate in the care of this patient. Please call with questions.   Cheral Marker. Ola Spurr, MD

## 2015-05-20 LAB — CREATININE, SERUM
Creatinine, Ser: 2.27 mg/dL — ABNORMAL HIGH (ref 0.61–1.24)
GFR calc Af Amer: 38 mL/min — ABNORMAL LOW (ref >60)
GFR calc non Af Amer: 33 mL/min — ABNORMAL LOW (ref >60)

## 2015-05-20 LAB — VANCOMYCIN, TROUGH: VANCOMYCIN TR: 29 ug/mL — AB (ref 10–20)

## 2015-05-20 LAB — ANAEROBIC CULTURE

## 2015-05-20 LAB — GLUCOSE, CAPILLARY
GLUCOSE-CAPILLARY: 124 mg/dL — AB (ref 65–99)
GLUCOSE-CAPILLARY: 152 mg/dL — AB (ref 65–99)
Glucose-Capillary: 180 mg/dL — ABNORMAL HIGH (ref 65–99)
Glucose-Capillary: 132 mg/dL — ABNORMAL HIGH (ref 65–99)

## 2015-05-20 LAB — SEDIMENTATION RATE: Sed Rate: 58 mm/hr — ABNORMAL HIGH (ref 0–15)

## 2015-05-20 MED ORDER — VANCOMYCIN HCL 10 G IV SOLR
1500.0000 mg | INTRAVENOUS | Status: DC
  Administered 2015-05-20: 1500 mg via INTRAVENOUS
  Filled 2015-05-20 (×2): qty 1500

## 2015-05-20 MED ORDER — CIPROFLOXACIN HCL 500 MG PO TABS
500.0000 mg | ORAL_TABLET | Freq: Two times a day (BID) | ORAL | Status: DC
  Administered 2015-05-20: 500 mg via ORAL
  Filled 2015-05-20 (×2): qty 1

## 2015-05-20 NOTE — Progress Notes (Deleted)
1 Day Post-Op  Subjective: Patient seen. Does not complain of any significant pain. States he is ready to go home.  Objective: Vital signs in last 24 hours: Temp:  [97.8 F (36.6 C)-99.9 F (37.7 C)] 97.8 F (36.6 C) (08/18 0729) Pulse Rate:  [76-94] 76 (08/18 0729) Resp:  [18] 18 (08/18 0729) BP: (113-140)/(62-64) 123/64 mmHg (08/18 0729) SpO2:  [97 %-100 %] 100 % (08/18 0729) Last BM Date: 05/19/15  Intake/Output from previous day: 08/17 0701 - 08/18 0700 In: 980 [P.O.:580; IV Piggyback:400] Out: -  Intake/Output this shift: Total I/O In: 200 [IV Piggyback:200] Out: -   Minimal erythema and edema. Some bloody discharge on the packing and bandaging. Minimal purulence. Incision is well coapted.  Lab Results:   Recent Labs  05/18/15 1357 05/19/15 0514  WBC 5.7 6.7  HGB 8.4* 8.7*  HCT 27.0* 28.2*  PLT 165 175   BMET  Recent Labs  05/18/15 1357 05/19/15 0514 05/20/15 0444  NA 142 144  --   K 3.9 3.9  --   CL 111 111  --   CO2 21* 25  --   GLUCOSE 131* 110*  --   BUN 35* 33*  --   CREATININE 1.78* 1.98* 2.27*  CALCIUM 9.5 9.2  --    PT/INR  Recent Labs  05/19/15 0040  LABPROT 23.5*  INR 2.08   ABG No results for input(s): PHART, HCO3 in the last 72 hours.  Invalid input(s): PCO2, PO2  Studies/Results: Mr Foot Left Wo Contrast  05/18/2015   CLINICAL DATA:  Injury on June 30th stepping on a nail. MRSA. Possible osteomyelitis. Deep vein thrombosis.  EXAM: MRI OF THE LEFT FOREFOOT WITHOUT CONTRAST  TECHNIQUE: Multiplanar, multisequence MR imaging was performed. No intravenous contrast was administered.  COMPARISON:  04/05/2015  FINDINGS: As abnormal osseous edema primarily in the proximal phalanx of the third toe but also to a lesser extent in the heads of the second, third, and fourth metatarsals. There is spurring at the Lisfranc joint without Lisfranc joint malalignment, and the Lisfranc ligament appears intact.  Dorsal subcutaneous edema along the foot  noted with edema tracking within along the plantar musculature of the foot. Focally confluent edema projects along the web space between the proximal phalanges of the third and fourth toes, in the vicinity of what appears to potentially be a puncture wound or draining sinus site on image 15 series 5 along the plantar ball of the foot.  Trace effusion of the middle subtalar facet.  IMPRESSION: 1. 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 Possible osteomyelitis involving the heads of the second, third, and fourth metatarsals. 2. Phlegmon and potentially incipient abscess tracking in the web space between the third and fourth proximal phalanges, with small locules of gas in the soft tissues in this vicinity, and extensive soft tissue edema. Possible draining sinus tract, correlate with site of puncture. 3. Dorsal subcutaneous edema and edema infiltrating the plantar musculature of the foot, likely representing cellulitis and myositis, respectively.   Electronically Signed   By: Gaylyn Rong M.D.   On: 05/18/2015 19:07    Anti-infectives: Anti-infectives    Start     Dose/Rate Route Frequency Ordered Stop   05/20/15 2000  ciprofloxacin (CIPRO) tablet 500 mg     500 mg Oral 2 times daily 05/20/15 0933     05/20/15 1600  vancomycin (VANCOCIN) 1,500 mg in sodium chloride 0.9 % 500 mL IVPB     1,500 mg 250 mL/hr  over 120 Minutes Intravenous Every 24 hours 05/20/15 0533     05/19/15 0800  ciprofloxacin (CIPRO) IVPB 400 mg  Status:  Discontinued     400 mg 200 mL/hr over 60 Minutes Intravenous Every 12 hours 05/18/15 2117 05/20/15 0933   05/19/15 0500  vancomycin (VANCOCIN) IVPB 1000 mg/200 mL premix  Status:  Discontinued     1,000 mg 200 mL/hr over 60 Minutes Intravenous Every 12 hours 05/18/15 2153 05/20/15 0527   05/18/15 2200  vancomycin (VANCOCIN) IVPB 1000 mg/200 mL premix     1,000 mg 200 mL/hr over 60 Minutes Intravenous  Once 05/18/15 2152 05/18/15 2351   05/18/15 2000   ciprofloxacin (CIPRO) IVPB 400 mg     400 mg 200 mL/hr over 60 Minutes Intravenous  Once 05/18/15 1959 05/18/15 2056   05/18/15 1945  vancomycin (VANCOCIN) IVPB 1000 mg/200 mL premix  Status:  Discontinued     1,000 mg 200 mL/hr over 60 Minutes Intravenous  Once 05/18/15 1937 05/18/15 2152   05/18/15 1945  piperacillin-tazobactam (ZOSYN) IVPB 3.375 g  Status:  Discontinued     3.375 g 12.5 mL/hr over 240 Minutes Intravenous  Once 05/18/15 1937 05/18/15 1959      Assessment/Plan: s/p Procedure(s): IRRIGATION AND DEBRIDEMENT FOOT (Left) The wound was repacked with sterile saline wet-to-dry dressing. The patient should be stable for discharge with home health care for daily wound packing. Antibiotic management per infectious disease but should be okay on oral antibiotics pending culture results. We will follow the patient up next week outpatient.  LOS: 2 days    Marisa Hage W. 05/20/2015

## 2015-05-20 NOTE — Care Management (Signed)
SURGERY APPROVED per Mahlon Gammon adjustor with patient's workers' comp on 05/20/15. Copy delivered to Dr. Tawanna Cooler Cline's office and placed on patient physical chart.

## 2015-05-20 NOTE — Progress Notes (Signed)
[facility] - [place] at [facility]   PATIENT NAME: Juan Roberson    MR#:  [id number]  DATE OF BIRTH:  10-29-1970  SUBJECTIVE:  CHIEF COMPLAINT:   Chief Complaint  Patient presents with  . Foot Pain   Patient here with left foot pain and swelling and MRI findings suggestive of osteomyelitis on the left third toe. Also noted to have an abscess between the 3rd & 4th toe web space.  Likely to go to OR tomorrow.     REVIEW OF SYSTEMS:    Review of Systems  Constitutional: Negative for fever and chills.  HENT: Negative for congestion and tinnitus.   Eyes: Negative for blurred vision and double vision.  Respiratory: Negative for cough, shortness of breath and wheezing.   Cardiovascular: Negative for chest pain, orthopnea and PND.  Gastrointestinal: Negative for nausea, vomiting, abdominal pain and diarrhea.  Genitourinary: Negative for dysuria and hematuria.  Musculoskeletal: Positive for joint pain (left foot pain).  Neurological: Negative for dizziness, sensory change and focal weakness.  All other systems reviewed and are negative.   Nutrition: Diabetic Tolerating Diet: Yes Tolerating PT: Ambulatory  DRUG ALLERGIES:   Allergies  Allergen Reactions  . Sulfur Other (See Comments)    Renal failure    VITALS:  Blood pressure 123/64, pulse 76, temperature 97.8 F (36.6 C), temperature source Oral, resp. rate 18, height  (1.905 m), weight 88.678 kg (195 lb 8 oz), SpO2 100 %.  PHYSICAL EXAMINATION:   Physical Exam  GENERAL:  44 y.o.-year-old patient lying in the bed with no acute distress.  EYES: Pupils equal, round, reactive to light and accommodation. No scleral icterus. Extraocular muscles intact.  HEENT: Head atraumatic, normocephalic. Oropharynx and nasopharynx clear.  NECK:  Supple, no jugular venous distention. No thyroid enlargement, no tenderness.  LUNGS: Normal breath sounds bilaterally, no wheezing, rales, rhonchi. No use of accessory  muscles of respiration.  CARDIOVASCULAR: S1, S2 normal. No murmurs, rubs, or gallops.  ABDOMEN: Soft, nontender, nondistended. Bowel sounds present. No organomegaly or mass.  EXTREMITIES: No cyanosis, clubbing or edema b/l.  Left plantar foot swelling, redness, open sore with minimal drainage.     NEUROLOGIC: Cranial nerves II through XII are intact. No focal Motor or sensory deficits b/l.   PSYCHIATRIC: The patient is alert and oriented x 3. Good affect SKIN: No obvious rash, lesion, or left foot plantar ulcer.    LABORATORY PANEL:   CBC  Recent Labs Lab 05/19/15 0514  WBC 6.7  HGB 8.7*  HCT 28.2*  PLT 175   ------------------------------------------------------------------------------------------------------------------  Chemistries   Recent Labs Lab 05/19/15 0514 05/20/15 0444  NA 144  --   K 3.9  --   CL 111  --   CO2 25  --   GLUCOSE 110*  --   BUN 33*  --   CREATININE 1.98* 2.27*  CALCIUM 9.2  --    ------------------------------------------------------------------------------------------------------------------  Cardiac Enzymes No results for input(s): TROPONINI in the last 168 hours. ------------------------------------------------------------------------------------------------------------------  RADIOLOGY:  Mr Foot Left Wo Contrast  05/18/2015   CLINICAL DATA:  Injury on June 30th stepping on a nail. MRSA. Possible osteomyelitis. Deep vein thrombosis.  EXAM: MRI OF THE LEFT FOREFOOT WITHOUT CONTRAST  TECHNIQUE: Multiplanar, multisequence MR imaging was performed. No intravenous contrast was administered.  COMPARISON:  04/05/2015  FINDINGS: As abnormal osseous edema primarily in the proximal phalanx of the third toe but also to a lesser extent in the heads of the second, third, and  fourth metatarsals. There is spurring at the Lisfranc joint without Lisfranc joint malalignment, and the Lisfranc ligament appears intact.  Dorsal subcutaneous edema along the foot  noted with edema tracking within along the plantar musculature of the foot. Focally confluent edema projects along the web space between the proximal phalanges of the third and fourth toes, in the vicinity of what appears to potentially be a puncture wound or draining sinus site on image 15 series 5 along the plantar ball of the foot.  Trace effusion of the middle subtalar facet.  IMPRESSION: 1. 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 Possible osteomyelitis involving the heads of the second, third, and fourth metatarsals. 2. Phlegmon and potentially incipient abscess tracking in the web space between the third and fourth proximal phalanges, with small locules of gas in the soft tissues in this vicinity, and extensive soft tissue edema. Possible draining sinus tract, correlate with site of puncture. 3. Dorsal subcutaneous edema and edema infiltrating the plantar musculature of the foot, likely representing cellulitis and myositis, respectively.   Electronically Signed   By: Gaylyn Rong M.D.   On: 05/18/2015 19:07     ASSESSMENT AND PLAN:   44 year old male with history of diabetes, hypertension, chronic kidney disease stage III, GERD, history of left foot cellulitis who presents to the hospital but the left foot swelling and redness and pain.  #1 left foot redness swelling and pain-this is likely secondary to osteomyelitis/abscess formation. -Patient had MRI of the left foot showing osteomyelitis of the proximal phalanx of the third toe. There is also phlegmon or abscess tracking in the web space between the third and fourth proximal phalanges. -Continue supportive care with IV antibiotics with vancomycin, Cipro. -Patient has been seen by infectious disease, podiatry and plan for surgical debridement tomorrow.  -Patient likely will need long-term IV antibiotic therapy after surgical debridement. Will order PICC line after surgical debridement.   #2 chronic kidney disease stage  III-patient's creatinine is close to baseline. We'll continue to monitor renal function. -No acute need for nephrology consultation at this time.  #3 hypertension-continue clonidine, Coreg, amlodipine.  #4 diabetic neuropathy-continue Neurontin, Lyrica.  #5 diabetes with renal complication-continue Lantus, sliding scale insulin.  #6 GERD-continue Protonix.  All the records are reviewed and case discussed with Care Management/Social Workerr. Management plans discussed with the patient, family and they are in agreement.  CODE STATUS: Full code  DVT Prophylaxis: Ambulatory  TOTAL TIME TAKING CARE OF THIS PATIENT: 25 minutes.   POSSIBLE D/C IN 2-3 DAYS, DEPENDING ON CLINICAL CONDITION.   Houston Siren M.D on 05/20/2015 at 2:05 PM  Between 7am to 6pm - Pager - [phone]  After 6pm go to [email or web address] - password [email or web address] [facility]  [place] [facility]  Office  [phone]  CC: Primary care physician; Corky Downs, MD

## 2015-05-20 NOTE — Progress Notes (Signed)
Initial Nutrition Assessment   INTERVENTION:   Meals and Snacks: Cater to patient preferences Education: reinforced importance of adequate (not excessive as pt with hx of CKD) protein intake and good glucose control for wound healing; HGbA1c 7.7. Managing diabetes fairly well as outpatient. Pt did not have any questions regarding diet on visit today   NUTRITION DIAGNOSIS:   No nutrition diagnosis at this time  GOAL:   Patient will meet greater than or equal to 90% of their needs  MONITOR:    (Energy Intake, Anthropometrics, Glucose Profile)  REASON FOR ASSESSMENT:   Malnutrition Screening Tool    ASSESSMENT:    Pt admitted with foot pain with possible osteomyelitis of toe, abscess between 3rd and 4th toe; plan for surgical debridement tomorrow  Past Medical History  Diagnosis Date  . Diabetes mellitus without complication   . Hypertension   . Chronic kidney disease   . GERD (gastroesophageal reflux disease)   . Cellulitis and abscess of foot     Left-Dr. Ether Griffins    Diet Order:  Diet Carb Modified Fluid consistency:: Thin; Room service appropriate?: Yes Diet NPO time specified   Energy Intake: pt reports good appetite at present, eating food from [employer] for lunch on visit today. No recorded po intake   Nutrition Focused Physical Exam: Nutrition-Focused physical exam completed. Findings are WDL for fat depletion, muscle depletion, and edema.   Glucose Profile:  Recent Labs  05/19/15 2055 05/20/15 0727 05/20/15 1121  GLUCAP 121* 124* 180*   Lab Results  Component Value Date   HGBA1C 7.7* 05/18/2015   Electrolyte and Renal Profile:  Recent Labs Lab 05/18/15 1357 05/19/15 0514 05/20/15 0444  BUN 35* 33*  --   CREATININE 1.78* 1.98* 2.27*  NA 142 144  --   K 3.9 3.9  --    Protein Profile: No results for input(s): ALBUMIN in the last 168 hours.  Meds: lasix, ss novolog, lantus  Height:   Ht Readings from Last 1 Encounters:  05/18/15  (1.905  m)    Weight:  Wt Readings from Last 1 Encounters:  05/18/15 195 lb 8 oz (88.678 kg)    BMI:  Body mass index is 24.44 kg/(m^2).  LOW Care Level  Romelle Starcher MS, [place], LDN [phone] Pager

## 2015-05-20 NOTE — Progress Notes (Signed)
ANTIBIOTIC CONSULT NOTE - INITIAL  Pharmacy Consult for Vancomycin Indication: osteomyelitis  Allergies  Allergen Reactions  . Sulfur Other (See Comments)    Renal failure    Patient Measurements: Height:  (190.5 cm) Weight: 195 lb 8 oz (88.678 kg) IBW/kg (Calculated) : 84.5 Adjusted Body Weight: 86.8 kg  Vital Signs: Temp: 98.1 F (36.7 C) (08/18 0433) Temp Source: Oral (08/18 0433) BP: 113/64 mmHg (08/18 0433) Pulse Rate: 82 (08/18 0433) Intake/Output from previous day: 08/17 0701 - 08/18 0700 In: 980 [P.O.:580; IV Piggyback:400] Out: -  Intake/Output from this shift: Total I/O In: 880 [P.O.:480; IV Piggyback:400] Out: -   Labs:  Recent Labs  05/18/15 1357 05/19/15 0514 05/20/15 0444  WBC 5.7 6.7  --   HGB 8.4* 8.7*  --   PLT 165 175  --   CREATININE 1.78* 1.98* 2.27*   Estimated Creatinine Clearance: 48.6 mL/min (by C-G formula based on Cr of 2.27).  Recent Labs  05/20/15 0444  VANCOTROUGH 29*     Microbiology: Recent Results (from the past 720 hour(s))  Wound culture     Status: None   Collection Time: 05/13/15  2:50 AM  Result Value Ref Range Status   Specimen Description FOOT  Final   Special Requests NONE  Final   Gram Stain   Final    FEW WBC SEEN FEW GRAM POSITIVE COCCI RARE GRAM NEGATIVE RODS    Culture   Final    MODERATE GROWTH METHICILLIN RESISTANT STAPHYLOCOCCUS AUREUS MODERATE GROWTH GROUP B STREP(S.AGALACTIAE)ISOLATED CRITICAL RESULT CALLED TO, READ BACK BY AND VERIFIED WITH: LAURIE WOODS AR 1515 05/17/15 CTJ Virtually 100% of S. agalactiae (Group B) strains are susceptible to Penicillin.  For Penicillin-allergic patients, Erythromycin (85-95% sensitive) and Clindamycin (80% sensitive) are drugs of choice. Contact microbiology lab to request sensitivities if  needed within 7 days.    Report Status 05/18/2015 FINAL  Final   Organism ID, Bacteria METHICILLIN RESISTANT STAPHYLOCOCCUS AUREUS  Final      Susceptibility   Methicillin resistant staphylococcus aureus - MIC*    CIPROFLOXACIN >=8 RESISTANT Resistant     GENTAMICIN <=0.5 SENSITIVE Sensitive     OXACILLIN Value in next row Resistant      >=4 MODERATELY RESISTANT    VANCOMYCIN <=0.5 SENSITIVE Sensitive     TRIMETH/SULFA <=10 SENSITIVE Sensitive     CEFOXITIN SCREEN Value in next row Resistant      POSITIVECEFOXITIN SCREEN - This test may be used to predict mecA-mediated oxacillin resistance, and it is based on the cefoxitin disk screen test.  The cefoxitin screen and oxacillin work in combination to determine the final interpretation reported for oxacillin.     Inducible Clindamycin Value in next row Sensitive      POSITIVECEFOXITIN SCREEN - This test may be used to predict mecA-mediated oxacillin resistance, and it is based on the cefoxitin disk screen test.  The cefoxitin screen and oxacillin work in combination to determine the final interpretation reported for oxacillin.     ERYTHROMYCIN Value in next row Resistant      RESISTANT>=8    TETRACYCLINE Value in next row Sensitive      SENSITIVE<=1    CLINDAMYCIN Value in next row Sensitive      SENSITIVE<=0.25    LINEZOLID Value in next row Sensitive      SENSITIVE2    * MODERATE GROWTH METHICILLIN RESISTANT STAPHYLOCOCCUS AUREUS  Anaerobic culture     Status: None (Preliminary result)   Collection Time: 05/13/15  2:50 AM  Result Value Ref Range Status   Specimen Description FOOT  Final   Special Requests NONE  Final   Culture HOLDING FOR POSSIBLE ANAEROBE  Final   Report Status PENDING  Incomplete  Blood culture (routine x 2)     Status: None (Preliminary result)   Collection Time: 05/18/15  8:01 PM  Result Value Ref Range Status   Specimen Description BLOOD RIGHT ARM  Final   Special Requests BOTTLES DRAWN AEROBIC AND ANAEROBIC 2CC  Final   Culture NO GROWTH < 12 HOURS  Final   Report Status PENDING  Incomplete  Blood culture (routine x 2)     Status: None (Preliminary result)    Collection Time: 05/18/15  8:01 PM  Result Value Ref Range Status   Specimen Description BLOOD LEFT HAND  Final   Special Requests BOTTLES DRAWN AEROBIC AND ANAEROBIC 3CC  Final   Culture NO GROWTH < 12 HOURS  Final   Report Status PENDING  Incomplete    Medical History: Past Medical History  Diagnosis Date  . Diabetes mellitus without complication   . Hypertension   . Chronic kidney disease   . GERD (gastroesophageal reflux disease)   . Cellulitis and abscess of foot     Left-Dr. Ether Griffins    Medications:  Infusions:   Assessment: 44 yom cc foot pain with known history of cellulitis/abscess. MRI of left foot showed osteo. Now starting cipro/vancomycin.   CrCl 55 mL/min, Vd 63 L, Ke 0.038 hr-1, T 1/2 18.8 hr.   Goal of Therapy:  Vancomycin trough level 15-20 mcg/ml  Plan:  Measure antibiotic drug levels at steady state Follow up culture results   Vanc trough returned 29 mcg/mL. Based on new Ke of 0.038 hr-1 will change to 1.5 gm IV Q24H for predicted trough of 17 mcg/mL. Will check level before 4th dose.    Carola Frost, Pharm.D. Clinical Pharmacist 05/20/2015,5:30 AM

## 2015-05-20 NOTE — Progress Notes (Signed)
1 Day Post-Op  Subjective: Patient seen. States he is feeling much better as far as the nausea and vomiting.  Objective: Vital signs in last 24 hours: Temp:  [97.8 F (36.6 C)-99.9 F (37.7 C)] 97.8 F (36.6 C) (08/18 0729) Pulse Rate:  [76-94] 76 (08/18 0729) Resp:  [18] 18 (08/18 0729) BP: (113-140)/(62-64) 123/64 mmHg (08/18 0729) SpO2:  [97 %-100 %] 100 % (08/18 0729) Last BM Date: 05/19/15  Intake/Output from previous day: 08/17 0701 - 08/18 0700 In: 980 [P.O.:580; IV Piggyback:400] Out: -  Intake/Output this shift: Total I/O In: 200 [IV Piggyback:200] Out: -   Wound on the left foot appears stable at this point. Light bandage intact.  Lab Results:   Recent Labs  05/18/15 1357 05/19/15 0514  WBC 5.7 6.7  HGB 8.4* 8.7*  HCT 27.0* 28.2*  PLT 165 175   BMET  Recent Labs  05/18/15 1357 05/19/15 0514 05/20/15 0444  NA 142 144  --   K 3.9 3.9  --   CL 111 111  --   CO2 21* 25  --   GLUCOSE 131* 110*  --   BUN 35* 33*  --   CREATININE 1.78* 1.98* 2.27*  CALCIUM 9.5 9.2  --    PT/INR  Recent Labs  05/19/15 0040  LABPROT 23.5*  INR 2.08   ABG No results for input(s): PHART, HCO3 in the last 72 hours.  Invalid input(s): PCO2, PO2  Studies/Results: Mr Foot Left Wo Contrast  05/18/2015   CLINICAL DATA:  Injury on June 30th stepping on a nail. MRSA. Possible osteomyelitis. Deep vein thrombosis.  EXAM: MRI OF THE LEFT FOREFOOT WITHOUT CONTRAST  TECHNIQUE: Multiplanar, multisequence MR imaging was performed. No intravenous contrast was administered.  COMPARISON:  04/05/2015  FINDINGS: As abnormal osseous edema primarily in the proximal phalanx of the third toe but also to a lesser extent in the heads of the second, third, and fourth metatarsals. There is spurring at the Lisfranc joint without Lisfranc joint malalignment, and the Lisfranc ligament appears intact.  Dorsal subcutaneous edema along the foot noted with edema tracking within along the plantar  musculature of the foot. Focally confluent edema projects along the web space between the proximal phalanges of the third and fourth toes, in the vicinity of what appears to potentially be a puncture wound or draining sinus site on image 15 series 5 along the plantar ball of the foot.  Trace effusion of the middle subtalar facet.  IMPRESSION: 1. 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 Possible osteomyelitis involving the heads of the second, third, and fourth metatarsals. 2. Phlegmon and potentially incipient abscess tracking in the web space between the third and fourth proximal phalanges, with small locules of gas in the soft tissues in this vicinity, and extensive soft tissue edema. Possible draining sinus tract, correlate with site of puncture. 3. Dorsal subcutaneous edema and edema infiltrating the plantar musculature of the foot, likely representing cellulitis and myositis, respectively.   Electronically Signed   By: Gaylyn Rong M.D.   On: 05/18/2015 19:07    Anti-infectives: Anti-infectives    Start     Dose/Rate Route Frequency Ordered Stop   05/20/15 2000  ciprofloxacin (CIPRO) tablet 500 mg     500 mg Oral 2 times daily 05/20/15 0933     05/20/15 1600  vancomycin (VANCOCIN) 1,500 mg in sodium chloride 0.9 % 500 mL IVPB     1,500 mg 250 mL/hr over 120 Minutes Intravenous Every 24  hours 05/20/15 0533     05/19/15 0800  ciprofloxacin (CIPRO) IVPB 400 mg  Status:  Discontinued     400 mg 200 mL/hr over 60 Minutes Intravenous Every 12 hours 05/18/15 2117 05/20/15 0933   05/19/15 0500  vancomycin (VANCOCIN) IVPB 1000 mg/200 mL premix  Status:  Discontinued     1,000 mg 200 mL/hr over 60 Minutes Intravenous Every 12 hours 05/18/15 2153 05/20/15 0527   05/18/15 2200  vancomycin (VANCOCIN) IVPB 1000 mg/200 mL premix     1,000 mg 200 mL/hr over 60 Minutes Intravenous  Once 05/18/15 2152 05/18/15 2351   05/18/15 2000  ciprofloxacin (CIPRO) IVPB 400 mg     400 mg 200 mL/hr  over 60 Minutes Intravenous  Once 05/18/15 1959 05/18/15 2056   05/18/15 1945  vancomycin (VANCOCIN) IVPB 1000 mg/200 mL premix  Status:  Discontinued     1,000 mg 200 mL/hr over 60 Minutes Intravenous  Once 05/18/15 1937 05/18/15 2152   05/18/15 1945  piperacillin-tazobactam (ZOSYN) IVPB 3.375 g  Status:  Discontinued     3.375 g 12.5 mL/hr over 240 Minutes Intravenous  Once 05/18/15 1937 05/18/15 1959      Assessment/Plan: s/p Procedure(s): IRRIGATION AND DEBRIDEMENT FOOT (Left) Assessment: Abscess left forefoot with osteomyelitis left third toe.   Plan: Surgery was finally authorized by his Worker's Compensation. Discussed with the patient as previous that we will go ahead and plan for surgery tomorrow afternoon. Same procedure and recovery time as well as possible complications. Plan for surgery tomorrow. Nothing by mouth after midnight. Consent forms are signed  LOS: 2 days    Kristy Catoe W. 05/20/2015

## 2015-05-21 ENCOUNTER — Inpatient Hospital Stay: Payer: Worker's Compensation | Admitting: Anesthesiology

## 2015-05-21 ENCOUNTER — Encounter: Payer: Self-pay | Admitting: Nurse Practitioner

## 2015-05-21 ENCOUNTER — Encounter
Admission: EM | Disposition: A | Discharge status: Home Health | Payer: Self-pay | Source: Home / Self Care | Attending: Specialist

## 2015-05-21 DIAGNOSIS — R9431 Abnormal electrocardiogram [ECG] [EKG]: Secondary | ICD-10-CM

## 2015-05-21 HISTORY — PX: IRRIGATION AND DEBRIDEMENT FOOT: SHX6602

## 2015-05-21 LAB — BASIC METABOLIC PANEL
ANION GAP: 9 (ref 5–15)
BUN: 49 mg/dL — ABNORMAL HIGH (ref 6–20)
CALCIUM: 8.6 mg/dL — AB (ref 8.9–10.3)
CO2: 23 mmol/L (ref 22–32)
Chloride: 109 mmol/L (ref 101–111)
Creatinine, Ser: 2.78 mg/dL — ABNORMAL HIGH (ref 0.61–1.24)
GFR, EST AFRICAN AMERICAN: 30 mL/min — AB (ref >60)
GFR, EST NON AFRICAN AMERICAN: 26 mL/min — AB (ref >60)
GLUCOSE: 108 mg/dL — AB (ref 65–99)
POTASSIUM: 3.9 mmol/L (ref 3.5–5.1)
Sodium: 141 mmol/L (ref 135–145)

## 2015-05-21 LAB — GLUCOSE, CAPILLARY
GLUCOSE-CAPILLARY: 103 mg/dL — AB (ref 65–99)
GLUCOSE-CAPILLARY: 95 mg/dL (ref 65–99)
Glucose-Capillary: 90 mg/dL (ref 65–99)

## 2015-05-21 LAB — TROPONIN I: TROPONIN I: 0.05 ng/mL — AB (ref <0.031)

## 2015-05-21 SURGERY — IRRIGATION AND DEBRIDEMENT FOOT
Anesthesia: General | Laterality: Left

## 2015-05-21 MED ORDER — GENTAMICIN SULFATE 40 MG/ML IJ SOLN
INTRAMUSCULAR | Status: AC
  Filled 2015-05-21: qty 2

## 2015-05-21 MED ORDER — SODIUM CHLORIDE 0.9 % IJ SOLN
3.0000 mL | Freq: Two times a day (BID) | INTRAMUSCULAR | Status: DC
  Administered 2015-05-21 – 2015-05-22 (×3): 3 mL via INTRAVENOUS

## 2015-05-21 MED ORDER — FENTANYL CITRATE (PF) 100 MCG/2ML IJ SOLN: 25.0000 ug | INTRAMUSCULAR | Status: DC | PRN

## 2015-05-21 MED ORDER — PROPOFOL 10 MG/ML IV BOLUS
INTRAVENOUS | Status: DC | PRN
  Administered 2015-05-21: 50 mg via INTRAVENOUS

## 2015-05-21 MED ORDER — PROPOFOL INFUSION 10 MG/ML OPTIME
INTRAVENOUS | Status: DC | PRN
  Administered 2015-05-21: 75 ug/kg/min via INTRAVENOUS

## 2015-05-21 MED ORDER — SODIUM CHLORIDE 0.9 % IV SOLN
530.0000 mg | INTRAVENOUS | Status: DC
  Filled 2015-05-21: qty 10.6

## 2015-05-21 MED ORDER — SODIUM CHLORIDE 0.9 % IV SOLN
INTRAVENOUS | Status: DC | PRN
  Administered 2015-05-21: 14:00:00 via INTRAVENOUS

## 2015-05-21 MED ORDER — VANCOMYCIN HCL 1000 MG IV SOLR
INTRAVENOUS | Status: AC
  Filled 2015-05-21: qty 1000

## 2015-05-21 MED ORDER — MIDAZOLAM HCL 2 MG/2ML IJ SOLN
INTRAMUSCULAR | Status: DC | PRN
  Administered 2015-05-21: 2 mg via INTRAVENOUS

## 2015-05-21 MED ORDER — VANCOMYCIN HCL 10 G IV SOLR
1500.0000 mg | INTRAVENOUS | Status: DC
  Filled 2015-05-21: qty 1500

## 2015-05-21 MED ORDER — ASPIRIN 81 MG PO CHEW
CHEWABLE_TABLET | ORAL | Status: AC
  Administered 2015-05-21: 324 mg
  Filled 2015-05-21: qty 1

## 2015-05-21 MED ORDER — ASPIRIN 81 MG PO CHEW
81.0000 mg | CHEWABLE_TABLET | Freq: Every day | ORAL | Status: DC
  Administered 2015-05-22: 81 mg via ORAL
  Filled 2015-05-21: qty 1

## 2015-05-21 MED ORDER — ONDANSETRON HCL 4 MG/2ML IJ SOLN: 4.0000 mg | Freq: Once | INTRAMUSCULAR | Status: DC | PRN

## 2015-05-21 MED ORDER — SODIUM CHLORIDE 0.9 % IV SOLN: 250.0000 mL | INTRAVENOUS | Status: DC | PRN

## 2015-05-21 MED ORDER — SODIUM CHLORIDE 0.9 % IJ SOLN: 3.0000 mL | INTRAMUSCULAR | Status: DC | PRN

## 2015-05-21 MED ORDER — NEOMYCIN-POLYMYXIN B GU 40-200000 IR SOLN
Status: AC
  Filled 2015-05-21: qty 2

## 2015-05-21 MED ORDER — LIDOCAINE HCL (PF) 1 % IJ SOLN
INTRAMUSCULAR | Status: AC
  Filled 2015-05-21: qty 30

## 2015-05-21 MED ORDER — BUPIVACAINE HCL (PF) 0.5 % IJ SOLN
INTRAMUSCULAR | Status: AC
  Filled 2015-05-21: qty 30

## 2015-05-21 MED ORDER — LIDOCAINE HCL (PF) 1 % IJ SOLN
INTRAMUSCULAR | Status: DC | PRN
  Administered 2015-05-21: 10 mL

## 2015-05-21 MED ORDER — SODIUM CHLORIDE 0.9 % IV SOLN
500.0000 mg | INTRAVENOUS | Status: DC
  Administered 2015-05-21: 500 mg via INTRAVENOUS
  Filled 2015-05-21 (×3): qty 10

## 2015-05-21 MED ORDER — CIPROFLOXACIN HCL 500 MG PO TABS
500.0000 mg | ORAL_TABLET | ORAL | Status: DC
  Administered 2015-05-21 – 2015-05-22 (×2): 500 mg via ORAL
  Filled 2015-05-21 (×2): qty 1

## 2015-05-21 MED ORDER — ATORVASTATIN CALCIUM 20 MG PO TABS
80.0000 mg | ORAL_TABLET | Freq: Every day | ORAL | Status: DC
  Administered 2015-05-22 – 2015-05-24 (×3): 80 mg via ORAL
  Filled 2015-05-21 (×3): qty 4

## 2015-05-21 SURGICAL SUPPLY — 51 items
BAG COUNTER SPONGE EZ (MISCELLANEOUS) IMPLANT
BANDAGE ELASTIC 4 CLIP NS LF (GAUZE/BANDAGES/DRESSINGS) ×3 IMPLANT
BLADE DEBAKEY 8.0 (BLADE) ×2 IMPLANT
BLADE DEBAKEY 8.0MM (BLADE) ×1
BLADE EAR TYMPAN 2.5 60D BEAV (BLADE) ×3 IMPLANT
BLADE OSC/SAGITTAL MD 5.5X18 (BLADE) ×3 IMPLANT
BLADE OSCILLATING/SAGITTAL (BLADE) ×2
BLADE SURG 15 STRL LF DISP TIS (BLADE) ×1 IMPLANT
BLADE SURG 15 STRL SS (BLADE) ×2
BLADE SW THK.38XMED LNG THN (BLADE) ×1 IMPLANT
BNDG ESMARK 4X12 TAN STRL LF (GAUZE/BANDAGES/DRESSINGS) ×3 IMPLANT
BNDG GAUZE 4.5X4.1 6PLY STRL (MISCELLANEOUS) ×3 IMPLANT
CANISTER SUCT 1200ML W/VALVE (MISCELLANEOUS) ×3 IMPLANT
COUNTER SPONGE BAG EZ (MISCELLANEOUS)
CUFF TOURN 18 STER (MISCELLANEOUS) ×3 IMPLANT
CUFF TOURN DUAL PL 12 NO SLV (MISCELLANEOUS) IMPLANT
DRAPE FLUOR MINI C-ARM 54X84 (DRAPES) ×3 IMPLANT
DURAPREP 26ML APPLICATOR (WOUND CARE) ×3 IMPLANT
GAUZE FLUFF 18X24 1PLY STRL (GAUZE/BANDAGES/DRESSINGS) ×3 IMPLANT
GAUZE PETRO XEROFOAM 1X8 (MISCELLANEOUS) ×3 IMPLANT
GAUZE SPONGE 4X4 12PLY STRL (GAUZE/BANDAGES/DRESSINGS) ×3 IMPLANT
GAUZE STRETCH 2X75IN STRL (MISCELLANEOUS) ×3 IMPLANT
GLOVE BIO SURGEON STRL SZ7.5 (GLOVE) ×12 IMPLANT
GLOVE INDICATOR 8.0 STRL GRN (GLOVE) ×3 IMPLANT
GOWN STRL REUS W/ TWL LRG LVL3 (GOWN DISPOSABLE) ×2 IMPLANT
GOWN STRL REUS W/TWL LRG LVL3 (GOWN DISPOSABLE) ×4
HANDPIECE VERSAJET DEBRIDEMENT (MISCELLANEOUS) ×3 IMPLANT
KIT STIMULAN RAPID CURE 5CC (Orthopedic Implant) ×3 IMPLANT
LABEL OR SOLS (LABEL) IMPLANT
NDL SAFETY 25GX1.5 (NEEDLE) ×9 IMPLANT
NEEDLE FILTER BLUNT 18X 1/2SAF (NEEDLE) ×2
NEEDLE FILTER BLUNT 18X1 1/2 (NEEDLE) ×1 IMPLANT
NS IRRIG 500ML POUR BTL (IV SOLUTION) ×3 IMPLANT
PACK EXTREMITY ARMC (MISCELLANEOUS) ×3 IMPLANT
PAD ABD DERMACEA PRESS 5X9 (GAUZE/BANDAGES/DRESSINGS) ×6 IMPLANT
PAD GROUND ADULT SPLIT (MISCELLANEOUS) ×3 IMPLANT
PENCIL ELECTRO HAND CTR (MISCELLANEOUS) ×3 IMPLANT
RASP SM TEAR CROSS CUT (RASP) ×3 IMPLANT
SOL PREP PVP 2OZ (MISCELLANEOUS) ×3
SOLUTION PREP PVP 2OZ (MISCELLANEOUS) ×1 IMPLANT
STOCKINETTE STRL 4IN 9604848 (GAUZE/BANDAGES/DRESSINGS) ×3 IMPLANT
STOCKINETTE STRL 6IN 960660 (GAUZE/BANDAGES/DRESSINGS) ×3 IMPLANT
STRAP SAFETY BODY (MISCELLANEOUS) ×3 IMPLANT
SUT ETHILON 4-0 (SUTURE) ×2
SUT ETHILON 4-0 FS2 18XMFL BLK (SUTURE) ×1
SUT VIC AB 3-0 SH 27 (SUTURE) ×2
SUT VIC AB 3-0 SH 27X BRD (SUTURE) ×1 IMPLANT
SUT VIC AB 4-0 FS2 27 (SUTURE) ×3 IMPLANT
SUTURE ETHLN 4-0 FS2 18XMF BLK (SUTURE) ×1 IMPLANT
SYR 3ML LL SCALE MARK (SYRINGE) ×3 IMPLANT
SYRINGE 10CC LL (SYRINGE) ×6 IMPLANT

## 2015-05-21 NOTE — Progress Notes (Signed)
[facility] - [place] at [facility]   PATIENT NAME: Juan Roberson    MR#:  [id number]  DATE OF BIRTH:  1971/05/06  SUBJECTIVE:  CHIEF COMPLAINT:   Chief Complaint  Patient presents with  . Foot Pain   Patient here with left foot pain and swelling and MRI findings suggestive of osteomyelitis on the left third toe. Also noted to have an abscess between the 3rd & 4th toe web space. Going to OR later today for debridement .   REVIEW OF SYSTEMS:    Review of Systems  Constitutional: Negative for fever and chills.  HENT: Negative for congestion and tinnitus.   Eyes: Negative for blurred vision and double vision.  Respiratory: Negative for cough, shortness of breath and wheezing.   Cardiovascular: Negative for chest pain, orthopnea and PND.  Gastrointestinal: Negative for nausea, vomiting, abdominal pain and diarrhea.  Genitourinary: Negative for dysuria and hematuria.  Musculoskeletal: Positive for joint pain (left foot pain).  Neurological: Negative for dizziness, sensory change and focal weakness.  All other systems reviewed and are negative.   Nutrition: Diabetic Tolerating Diet: Yes Tolerating PT: Ambulatory  DRUG ALLERGIES:   Allergies  Allergen Reactions  . Sulfur Other (See Comments)    Renal failure    VITALS:  Blood pressure 122/84, pulse 88, temperature 100 F (37.8 C), temperature source Oral, resp. rate 18, height 6\' 3"  (1.905 m), weight 88.678 kg (195 lb 8 oz), SpO2 95 %.  PHYSICAL EXAMINATION:   Physical Exam  GENERAL:  44 y.o.-year-old patient lying in the bed with no acute distress.  EYES: Pupils equal, round, reactive to light and accommodation. No scleral icterus. Extraocular muscles intact.  HEENT: Head atraumatic, normocephalic. Oropharynx and nasopharynx clear.  NECK:  Supple, no jugular venous distention. No thyroid enlargement, no tenderness.  LUNGS: Normal breath sounds bilaterally, no wheezing, rales, rhonchi. No use of  accessory muscles of respiration.  CARDIOVASCULAR: S1, S2 normal. No murmurs, rubs, or gallops.  ABDOMEN: Soft, nontender, nondistended. Bowel sounds present. No organomegaly or mass.  EXTREMITIES: No cyanosis, clubbing or edema b/l.  Left plantar foot swelling, redness, open sore with minimal drainage.     NEUROLOGIC: Cranial nerves II through XII are intact. No focal Motor or sensory deficits b/l.   PSYCHIATRIC: The patient is alert and oriented x 3. Good affect SKIN: No obvious rash, lesion, or left foot plantar ulcer.    LABORATORY PANEL:   CBC  Recent Labs Lab 05/19/15 0514  WBC 6.7  HGB 8.7*  HCT 28.2*  PLT 175   ------------------------------------------------------------------------------------------------------------------  Chemistries   Recent Labs Lab 05/21/15 0430  NA 141  K 3.9  CL 109  CO2 23  GLUCOSE 108*  BUN 49*  CREATININE 2.78*  CALCIUM 8.6*   ------------------------------------------------------------------------------------------------------------------  Cardiac Enzymes No results for input(s): TROPONINI in the last 168 hours. ------------------------------------------------------------------------------------------------------------------  RADIOLOGY:  No results found.   ASSESSMENT AND PLAN:   44 year old male with history of diabetes, hypertension, chronic kidney disease stage III, GERD, history of left foot cellulitis who presents to the hospital but the left foot swelling and redness and pain.  #1 left foot redness swelling and pain-this is likely secondary to osteomyelitis/abscess formation. -Patient had MRI of the left foot showing osteomyelitis of the proximal phalanx of the third toe. There is also phlegmon or abscess tracking in the web space between the third and fourth proximal phalanges. -Continue supportive care with IV antibiotics with vancomycin, Cipro. -Patient has been seen by infectious  disease, podiatry and plan for  surgical debridement today.  -Patient likely will need long-term IV antibiotic therapy after surgical debridement. Will order PICC line later today after surgical debridement.   #2 chronic kidney disease stage III-patient's creatinine is close to baseline. We'll continue to monitor renal function.  #3 hypertension-continue clonidine, Coreg, amlodipine.  #4 diabetic neuropathy-continue Neurontin, Lyrica.  #5 diabetes with renal complication-continue Lantus, sliding scale insulin.  #6 GERD-continue Protonix.  Likely d/c home with home health nursing after PICC line placed.   All the records are reviewed and case discussed with Care Management/Social Workerr. Management plans discussed with the patient, family and they are in agreement.  CODE STATUS: Full code  DVT Prophylaxis: Ambulatory  TOTAL TIME TAKING CARE OF THIS PATIENT: 25 minutes.   POSSIBLE D/C IN 1-2 DAYS, DEPENDING ON CLINICAL CONDITION.   Houston Siren M.D on 05/21/2015 at 1:10 PM  Between 7am to 6pm - Pager - [phone]  After 6pm go to [email or web address] - password [email or web address] [facility]  [place] [facility]  Office  [phone]  CC: Primary care physician; Corky Downs, MD

## 2015-05-21 NOTE — Progress Notes (Signed)
ANTIBIOTIC CONSULT NOTE - FOLLOW UP   Pharmacy Consult for Vancomycin Indication: osteomyelitis  Allergies  Allergen Reactions  . Sulfur Other (See Comments)    Renal failure    Patient Measurements: Height:  (190.5 cm) Weight: 195 lb 8 oz (88.678 kg) IBW/kg (Calculated) : 84.5 Adjusted Body Weight: 86.8 kg  Vital Signs: Temp: 100 F (37.8 C) (08/19 0839) Temp Source: Oral (08/19 0839) BP: 122/84 mmHg (08/19 0839) Pulse Rate: 88 (08/19 0839) Intake/Output from previous day: 08/18 0701 - 08/19 0700 In: 200 [IV Piggyback:200] Out: 0  Intake/Output from this shift:    Labs:  Recent Labs  05/18/15 1357 05/19/15 0514 05/20/15 0444 05/21/15 0430  WBC 5.7 6.7  --   --   HGB 8.4* 8.7*  --   --   PLT 165 175  --   --   CREATININE 1.78* 1.98* 2.27* 2.78*   Estimated Creatinine Clearance: 39.7 mL/min (by C-G formula based on Cr of 2.78).  Recent Labs  05/20/15 0444  VANCOTROUGH 29*     Microbiology: Recent Results (from the past 720 hour(s))  Wound culture     Status: None   Collection Time: 05/13/15  2:50 AM  Result Value Ref Range Status   Specimen Description FOOT  Final   Special Requests NONE  Final   Gram Stain   Final    FEW WBC SEEN FEW GRAM POSITIVE COCCI RARE GRAM NEGATIVE RODS    Culture   Final    MODERATE GROWTH METHICILLIN RESISTANT STAPHYLOCOCCUS AUREUS MODERATE GROWTH GROUP B STREP(S.AGALACTIAE)ISOLATED CRITICAL RESULT CALLED TO, READ BACK BY AND VERIFIED WITH: LAURIE WOODS AR 1515 05/17/15 CTJ Virtually 100% of S. agalactiae (Group B) strains are susceptible to Penicillin.  For Penicillin-allergic patients, Erythromycin (85-95% sensitive) and Clindamycin (80% sensitive) are drugs of choice. Contact microbiology lab to request sensitivities if  needed within 7 days.    Report Status 05/18/2015 FINAL  Final   Organism ID, Bacteria METHICILLIN RESISTANT STAPHYLOCOCCUS AUREUS  Final      Susceptibility   Methicillin resistant  staphylococcus aureus - MIC*    CIPROFLOXACIN >=8 RESISTANT Resistant     GENTAMICIN <=0.5 SENSITIVE Sensitive     OXACILLIN Value in next row Resistant      >=4 MODERATELY RESISTANT    VANCOMYCIN <=0.5 SENSITIVE Sensitive     TRIMETH/SULFA <=10 SENSITIVE Sensitive     CEFOXITIN SCREEN Value in next row Resistant      POSITIVECEFOXITIN SCREEN - This test may be used to predict mecA-mediated oxacillin resistance, and it is based on the cefoxitin disk screen test.  The cefoxitin screen and oxacillin work in combination to determine the final interpretation reported for oxacillin.     Inducible Clindamycin Value in next row Sensitive      POSITIVECEFOXITIN SCREEN - This test may be used to predict mecA-mediated oxacillin resistance, and it is based on the cefoxitin disk screen test.  The cefoxitin screen and oxacillin work in combination to determine the final interpretation reported for oxacillin.     ERYTHROMYCIN Value in next row Resistant      RESISTANT>=8    TETRACYCLINE Value in next row Sensitive      SENSITIVE<=1    CLINDAMYCIN Value in next row Sensitive      SENSITIVE<=0.25    LINEZOLID Value in next row Sensitive      SENSITIVE2    * MODERATE GROWTH METHICILLIN RESISTANT STAPHYLOCOCCUS AUREUS  Anaerobic culture     Status: None  Collection Time: 05/13/15  2:50 AM  Result Value Ref Range Status   Specimen Description FOOT  Final   Special Requests NONE  Final   Culture NO ANAEROBES ISOLATED  Final   Report Status 05/20/2015 FINAL  Final  Blood culture (routine x 2)     Status: None (Preliminary result)   Collection Time: 05/18/15  8:01 PM  Result Value Ref Range Status   Specimen Description BLOOD RIGHT ARM  Final   Special Requests BOTTLES DRAWN AEROBIC AND ANAEROBIC 2CC  Final   Culture NO GROWTH 2 DAYS  Final   Report Status PENDING  Incomplete  Blood culture (routine x 2)     Status: None (Preliminary result)   Collection Time: 05/18/15  8:01 PM  Result Value Ref Range  Status   Specimen Description BLOOD LEFT HAND  Final   Special Requests BOTTLES DRAWN AEROBIC AND ANAEROBIC 3CC  Final   Culture NO GROWTH 2 DAYS  Final   Report Status PENDING  Incomplete    Medical History: Past Medical History  Diagnosis Date  . Diabetes mellitus without complication   . Hypertension   . Chronic kidney disease   . GERD (gastroesophageal reflux disease)   . Cellulitis and abscess of foot     Left-Dr. Ether Griffins    Medications:  Infusions:   Assessment: 44 yom cc foot pain with known history of cellulitis/abscess. MRI of left foot showed osteo. Now starting cipro/vancomycin.   CrCl 55 mL/min, Vd 63 L, Ke 0.038 hr-1, T 1/2 18.8 hr.    8/19: Scr continues to trend up   Goal of Therapy:  Vancomycin trough level 15-20 mcg/ml  Plan:  Measure antibiotic drug levels at steady state Follow up culture results   Will start to dose Vancomycin per levels. Will order Vancomycin random level to bed drawn ~ 24 hours post dose. Level likely to be elevated due to continuous bumps in Scr. Pharmacy to follow and redose base on Vancomycin levels.    Laurelyn Terrero D, Pharm.D. Clinical Pharmacist 05/21/2015,8:46 AM

## 2015-05-21 NOTE — Progress Notes (Signed)
ANTIBIOTIC CONSULT NOTE - INITIAL  Pharmacy Consult for Daptomycin Indication: Osteomyelitis  Allergies  Allergen Reactions  . Sulfur Other (See Comments)    Renal failure    Patient Measurements: Height: 6\' 3"  (190.5 cm) Weight: 195 lb 8 oz (88.678 kg) IBW/kg (Calculated) : 84.5   Vital Signs: Temp: 97.5 F (36.4 C) (08/19 1457) Temp Source: Oral (08/19 0839) BP: 133/73 mmHg (08/19 1557) Pulse Rate: 77 (08/19 1557) Intake/Output from previous day: 08/18 0701 - 08/19 0700 In: 200 [IV Piggyback:200] Out: 0  Intake/Output from this shift: Total I/O In: 600 [I.V.:600] Out: 10 [Blood:10]  Labs:  Recent Labs  05/19/15 0514 05/20/15 0444 05/21/15 0430  WBC 6.7  --   --   HGB 8.7*  --   --   PLT 175  --   --   CREATININE 1.98* 2.27* 2.78*   Estimated Creatinine Clearance: 39.7 mL/min (by C-G formula based on Cr of 2.78).  Recent Labs  05/20/15 0444  VANCOTROUGH 29*     Microbiology: Recent Results (from the past 720 hour(s))  Wound culture     Status: None   Collection Time: 05/13/15  2:50 AM  Result Value Ref Range Status   Specimen Description FOOT  Final   Special Requests NONE  Final   Gram Stain   Final    FEW WBC SEEN FEW GRAM POSITIVE COCCI RARE GRAM NEGATIVE RODS    Culture   Final    MODERATE GROWTH METHICILLIN RESISTANT STAPHYLOCOCCUS AUREUS MODERATE GROWTH GROUP B STREP(S.AGALACTIAE)ISOLATED CRITICAL RESULT CALLED TO, READ BACK BY AND VERIFIED WITH: LAURIE WOODS AR 1515 05/17/15 CTJ Virtually 100% of S. agalactiae (Group B) strains are susceptible to Penicillin.  For Penicillin-allergic patients, Erythromycin (85-95% sensitive) and Clindamycin (80% sensitive) are drugs of choice. Contact microbiology lab to request sensitivities if  needed within 7 days.    Report Status 05/18/2015 FINAL  Final   Organism ID, Bacteria METHICILLIN RESISTANT STAPHYLOCOCCUS AUREUS  Final      Susceptibility   Methicillin resistant staphylococcus aureus - MIC*     CIPROFLOXACIN >=8 RESISTANT Resistant     GENTAMICIN <=0.5 SENSITIVE Sensitive     OXACILLIN Value in next row Resistant      >=4 MODERATELY RESISTANT    VANCOMYCIN <=0.5 SENSITIVE Sensitive     TRIMETH/SULFA <=10 SENSITIVE Sensitive     CEFOXITIN SCREEN Value in next row Resistant      POSITIVECEFOXITIN SCREEN - This test may be used to predict mecA-mediated oxacillin resistance, and it is based on the cefoxitin disk screen test.  The cefoxitin screen and oxacillin work in combination to determine the final interpretation reported for oxacillin.     Inducible Clindamycin Value in next row Sensitive      POSITIVECEFOXITIN SCREEN - This test may be used to predict mecA-mediated oxacillin resistance, and it is based on the cefoxitin disk screen test.  The cefoxitin screen and oxacillin work in combination to determine the final interpretation reported for oxacillin.     ERYTHROMYCIN Value in next row Resistant      RESISTANT>=8    TETRACYCLINE Value in next row Sensitive      SENSITIVE<=1    CLINDAMYCIN Value in next row Sensitive      SENSITIVE<=0.25    LINEZOLID Value in next row Sensitive      SENSITIVE2    * MODERATE GROWTH METHICILLIN RESISTANT STAPHYLOCOCCUS AUREUS  Anaerobic culture     Status: None   Collection Time: 05/13/15  2:50 AM  Result Value Ref Range Status   Specimen Description FOOT  Final   Special Requests NONE  Final   Culture NO ANAEROBES ISOLATED  Final   Report Status 05/20/2015 FINAL  Final  Blood culture (routine x 2)     Status: None (Preliminary result)   Collection Time: 05/18/15  8:01 PM  Result Value Ref Range Status   Specimen Description BLOOD RIGHT ARM  Final   Special Requests BOTTLES DRAWN AEROBIC AND ANAEROBIC 2CC  Final   Culture NO GROWTH 3 DAYS  Final   Report Status PENDING  Incomplete  Blood culture (routine x 2)     Status: None (Preliminary result)   Collection Time: 05/18/15  8:01 PM  Result Value Ref Range Status   Specimen  Description BLOOD LEFT HAND  Final   Special Requests BOTTLES DRAWN AEROBIC AND ANAEROBIC 3CC  Final   Culture NO GROWTH 3 DAYS  Final   Report Status PENDING  Incomplete    Medical History: Past Medical History  Diagnosis Date  . Diabetes mellitus without complication   . Essential hypertension   . CKD (chronic kidney disease), stage III   . GERD (gastroesophageal reflux disease)   . Cellulitis and abscess of foot     Left-Dr. Ether Griffins  . Osteomyelitis     a. 05/2015 L foot.  . Left leg DVT     a. Dx 05/2015 -> Coumadin.  . Gastritis     a. 04/2015 hematemesis -> EGD: gastritis, esophagitis, duodenitis.  No active bleeding.  PPI added.    Medications:  Scheduled:  . amLODipine  10 mg Oral Daily  . carvedilol  50 mg Oral BID  . ciprofloxacin  500 mg Oral Q18H  . cloNIDine  0.2 mg Oral Daily  . DAPTOmycin (CUBICIN)  IV  530 mg Intravenous Q24H  . furosemide  40 mg Oral Daily  . hydrALAZINE  100 mg Oral TID  . HYDROcodone-acetaminophen  1 tablet Oral BID  . insulin aspart  0-5 Units Subcutaneous QHS  . insulin aspart  0-9 Units Subcutaneous TID WC  . insulin glargine  16 Units Subcutaneous Q2200  . pantoprazole  40 mg Oral Daily  . 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 ID wants to use daptomycin due to patient with CKD and will need 4 weeks of iv abx.   Goal of Therapy:  Resolution of infection  Plan:  Daptomycin 500 mg iv q 24 hours. Will monitor renal function closely and make sure that CPK is checked weekly.   Luisa Hart D 05/21/2015,4:15 PM

## 2015-05-21 NOTE — Progress Notes (Signed)
Received pt from PACU. Pt reports no pain. L foot is elevated and wrapped. Family at the beside. NP Brion Aliment at the bedside. Trop 0.05. Room air. ST depression. A & O. Pt has no further concerns at this time.

## 2015-05-21 NOTE — Progress Notes (Signed)
[facility] INFECTIOUS DISEASE PROGRESS NOTE Date of Admission:  05/18/2015     ID: Juan Roberson. is a 44 y.o. male with MRSA foot infection following puncture wound  Principal Problem:   Osteomyelitis of ankle or foot, acute Active Problems:   Foot abscess, left   Subjective: S/p I and D today  ROS  Eleven systems are reviewed and negative except per hpi  Medications:  Antibiotics Given (last 72 hours)    Date/Time Action Medication Dose Rate   05/18/15 2251 Given   vancomycin (VANCOCIN) IVPB 1000 mg/200 mL premix 1,000 mg 200 mL/hr   05/19/15 0537 Given   vancomycin (VANCOCIN) IVPB 1000 mg/200 mL premix 1,000 mg 200 mL/hr   05/19/15 0818 Given   ciprofloxacin (CIPRO) IVPB 400 mg 400 mg 200 mL/hr   05/19/15 1614 Given   vancomycin (VANCOCIN) IVPB 1000 mg/200 mL premix 1,000 mg 200 mL/hr   05/19/15 2017 Given   ciprofloxacin (CIPRO) IVPB 400 mg 400 mg 200 mL/hr   05/20/15 0551 Given   vancomycin (VANCOCIN) IVPB 1000 mg/200 mL premix 1,000 mg 200 mL/hr   05/20/15 4580 Given   ciprofloxacin (CIPRO) IVPB 400 mg 400 mg 200 mL/hr   05/20/15 1648 Given   vancomycin (VANCOCIN) 1,500 mg in sodium chloride 0.9 % 500 mL IVPB 1,500 mg 250 mL/hr   05/20/15 2027 Given   ciprofloxacin (CIPRO) tablet 500 mg 500 mg      . amLODipine  10 mg Oral Daily  . aspirin      . carvedilol  50 mg Oral BID  . ciprofloxacin  500 mg Oral Q18H  . cloNIDine  0.2 mg Oral Daily  . furosemide  40 mg Oral Daily  . hydrALAZINE  100 mg Oral TID  . HYDROcodone-acetaminophen  1 tablet Oral BID  . insulin aspart  0-5 Units Subcutaneous QHS  . insulin aspart  0-9 Units Subcutaneous TID WC  . insulin glargine  16 Units Subcutaneous Q2200  . pantoprazole  40 mg Oral Daily  . topiramate  25 mg Oral BID  . vancomycin  1,500 mg Intravenous Q24H    Objective: Vital signs in last 24 hours: Temp:  [97.5 F (36.4 C)-100.3 F (37.9 C)] 97.5 F (36.4 C) (08/19 1457) Pulse Rate:  [74-94] 78 (08/19  1527) Resp:  [14-18] 14 (08/19 1457) BP: (107-132)/(48-84) 121/66 mmHg (08/19 1527) SpO2:  [94 %-100 %] 100 % (08/19 1527) FiO2 (%):  [21 %] 21 % (08/19 1455) Constitutional: He is oriented to person, place, and time. He appears well-developed and well-nourished. No distress.  HENT: Mouth/Throat: Oropharynx is clear and moist. No oropharyngeal exudate.  Cardiovascular: Normal rate, regular rhythm and normal heart sounds. Pulmonary/Chest: Effort normal and breath sounds normal. No respiratory distress. He has no wheezes.  Abdominal: Soft. Bowel sounds are normal. He exhibits no distension. There is no tenderness.  Lymphadenopathy: He has no cervical adenopathy.  Neurological: He is alert and oriented to person, place, and time.  Ext Lfoot wrapped post op Psychiatric: He has a normal mood and affect. His behavior is normal.   Lab Results  Recent Labs  05/19/15 0514 05/20/15 0444 05/21/15 0430  WBC 6.7  --   --   HGB 8.7*  --   --   HCT 28.2*  --   --   NA 144  --  141  K 3.9  --  3.9  CL 111  --  109  CO2 25  --  23  BUN 33*  --  49*  CREATININE 1.98* 2.27* 2.78*    Results for orders placed or performed during the hospital encounter of 05/18/15  Blood culture (routine x 2)     Status: None (Preliminary result)   Collection Time: 05/18/15  8:01 PM  Result Value Ref Range Status   Specimen Description BLOOD RIGHT ARM  Final   Special Requests BOTTLES DRAWN AEROBIC AND ANAEROBIC 2CC  Final   Culture NO GROWTH 3 DAYS  Final   Report Status PENDING  Incomplete  Blood culture (routine x 2)     Status: None (Preliminary result)   Collection Time: 05/18/15  8:01 PM  Result Value Ref Range Status   Specimen Description BLOOD LEFT HAND  Final   Special Requests BOTTLES DRAWN AEROBIC AND ANAEROBIC 3CC  Final   Culture NO GROWTH 3 DAYS  Final   Report Status PENDING  Incomplete   Microbiology:  Assessment/Plan: Juan Roberson. is a 44 y.o. male with hx DM and CKD who  injured his L foot when he stepped on a screw at work on June 30th, He had to have I and D of the wound July 5th and has been treated as otpt with oral antibiotics however the wound has worsened. There was some concern for osteomyelitis and MRI done revealed osteomyelitis in the third toe with continued abscess in the forefoot. He developed NV and was admitted for IV abx and surgery. We were consulted for further abx management He had cultures done as otpt which grew Juny 4th MSSA and Klebsiella. Current wound cx with MRSA and Group B strep. S/p I and D 8/19 - removal of infected bone and tissue. ESR 58, CRP 2,4 (will repeat) Recommendations Place PICC He  will need likely 4 weeks IV abx for the MRSA infection however given CKD I am hesitant to use vancomycin. Will change to daptomycin 6 mg/kg. Check cpk now and weekly Continue oral cipro as may have gram negatives as well since Klebsiella noted before. Following IV course will likely need several weeks oral abx as well. Thank you very much for the consult. Will follow with you.  [place], [place]   05/21/2015, 3:56 PM

## 2015-05-21 NOTE — Progress Notes (Signed)
Pt was ordered both daptomycin and atorvastatin. Statins increase the risk of myopathies and rhabdo when used with daptomycin. However patient has osteomyelitis and CKD, therefore long term tx with vancomycin is not an optimal. Pt also has long standing HTN, DM and is at an increased risk of CAD. Patient would benefit greatly from statin.  Per package insert of daptomycin: In patients who continue on both drugs concomitantly, consider more frequent monitoring of CPK levels than the recommended weekly monitoring, as well as monitor patients for symptoms of myopathy including muscle pain or weakness. Additionally, more frequent monitoring of CPK levels is recommended in patients with renal insufficiency or who develop unexplained elevations in CPK. Discontinue daptomycin in patients who develop symptoms of myopathy and CPK elevations of greater than 1000 units/L, or in patients without symptoms with CPK elevations of greater than 2000 units/L  Please consider monitoring CPK more frequently than weekly while patient is on daptomycin therapy. Thank you  Olene Floss, Pharm.D Clinical Pharmacist

## 2015-05-21 NOTE — Progress Notes (Signed)
Glucometer not transferring data, pts glucose check was 160. Juan Roberson

## 2015-05-21 NOTE — Interval H&P Note (Signed)
History and Physical Interval Note:  05/21/2015 12:48 PM  Juan Roberson.  has presented today for surgery, with the diagnosis of infection left foot 3rd toe  The various methods of treatment have been discussed with the patient and family. After consideration of risks, benefits and other options for treatment, the patient has consented to  Procedure(s): IRRIGATION AND DEBRIDEMENT FOOT (Left) as a surgical intervention .  The patient's history has been reviewed, patient examined, no change in status, stable for surgery.  I have reviewed the patient's chart and labs.  Questions were answered to the patient's satisfaction.     Cecil Vandyke W.

## 2015-05-21 NOTE — OR Nursing (Signed)
Dr Henrene Hawking reviewed ekg cardiology consult called to Dr Kirke Corin.  At first, no new orders, said would see pt later even though told about st depression in all of v leads .  Dr Kirke Corin called back once reviewing 12 lead ekg and 324 mg baby asa chewed, stat tis drawn, and preparing for transfer to telem. Floor.  Drs. Kephart and [employer].  Family, daughter at bedside and updated on transfer to tele. Floor.

## 2015-05-21 NOTE — Transfer of Care (Signed)
Immediate Anesthesia Transfer of Care Note  Patient: Juan Roberson.  Procedure(s) Performed: Procedure(s): IRRIGATION AND DEBRIDEMENT FOOT (Left)  Patient Location: PACU  Anesthesia Type:General  Level of Consciousness: sedated  Airway & Oxygen Therapy: Patient Spontanous Breathing and Patient connected to face mask oxygen  Post-op Assessment: Report given to RN and Post -op Vital signs reviewed and stable  Post vital signs: Reviewed and stable  Last Vitals:  Filed Vitals:   05/21/15 0839  BP: 122/84  Pulse: 88  Temp: 37.8 C  Resp: 18    Complications: No apparent anesthesia complications

## 2015-05-21 NOTE — Anesthesia Preprocedure Evaluation (Addendum)
Anesthesia Evaluation  Patient identified by MRN, date of birth, ID band Patient awake    Reviewed: Allergy & Precautions, NPO status , Patient's Chart, lab work & pertinent test results  History of Anesthesia Complications Negative for: history of anesthetic complications  Airway Mallampati: II  TM Distance: >3 FB Neck ROM: Full    Dental  (+) Teeth Intact   Pulmonary          Cardiovascular hypertension, Pt. on medications     Neuro/Psych    GI/Hepatic GERD-  Medicated and Controlled,  Endo/Other  diabetes, Type 2  Renal/GU Renal disease (ARF hx)     Musculoskeletal   Abdominal   Peds  Hematology   Anesthesia Other Findings   Reproductive/Obstetrics                            Anesthesia Physical Anesthesia Plan  ASA: II  Anesthesia Plan: General   Post-op Pain Management:    Induction: Intravenous  Airway Management Planned: Nasal Cannula  Additional Equipment:   Intra-op Plan:   Post-operative Plan:   Informed Consent: I have reviewed the patients History and Physical, chart, labs and discussed the procedure including the risks, benefits and alternatives for the proposed anesthesia with the patient or authorized representative who has indicated his/her understanding and acceptance.     Plan Discussed with:   Anesthesia Plan Comments:         Anesthesia Quick Evaluation

## 2015-05-21 NOTE — Anesthesia Procedure Notes (Signed)
Procedure Name: MAC Date/Time: 05/21/2015 1:45 PM Performed by: Junious Silk Pre-anesthesia Checklist: Patient identified, Emergency Drugs available, Suction available, Patient being monitored and Timeout performed Oxygen Delivery Method: Simple face mask

## 2015-05-21 NOTE — Consult Note (Signed)
CARDIOLOGY CONSULT NOTE   Patient ID: Juan Roberson. MRN: [id number], DOB/AGE: May 19, 1971   Admit date: 05/18/2015 Date of Consult: 05/21/2015   Primary Physician: Corky Downs, MD Primary Cardiologist: new - seen by M. Kirke Corin, MD   Pt. Profile  44 y/o male w/o prior cardiac hx whom we've been asked to eval 2/2 abnl ecg.  Problem List  Past Medical History  Diagnosis Date  . Diabetes mellitus without complication     a. Dx ~ 1996.  . Essential hypertension   . CKD (chronic kidney disease), stage III   . GERD (gastroesophageal reflux disease)   . Cellulitis and abscess of foot     Left-Dr. Ether Griffins  . Osteomyelitis     a. 05/2015 L foot.  . Left leg DVT     a. Dx 05/2015 -> Coumadin.  . Gastritis     a. 04/2015 hematemesis -> EGD: gastritis, esophagitis, duodenitis.  No active bleeding.  PPI added.    Past Surgical History  Procedure Laterality Date  . No past surgeries    . I&d extremity Left 04/06/2015    Procedure: IRRIGATION AND DEBRIDEMENT EXTREMITY;  Surgeon: Gwyneth Revels, DPM;  Location: [facility] ORS;  Service: Podiatry;  Laterality: Left;  . Esophagogastroduodenoscopy N/A 04/07/2015    Procedure: ESOPHAGOGASTRODUODENOSCOPY (EGD);  Surgeon: Midge Minium, MD;  Location: [facility] ENDOSCOPY;  Service: Endoscopy;  Laterality: N/A;  . Foot surgery      Allergies  Allergies  Allergen Reactions  . Sulfur Other (See Comments)    Renal failure   HPI   44 y/o male w/o prior cardiac hx.  He does have risk factors including DM and HTN.  He was recently admitted to [facility] in July with cellulitis (wound grew Kelbsiella and S aureus) of his left foot following stepping on a screw.  He was treated with abx and I & D, and while hospitalized, developed hematemesis.  He was seen by GI with EGD showing duodenitis, esophagitis, and gastritis.  No source of bleeding was found.  He was placed on PPI therapy and subsequently d/c'd on cipro therapy.  Due to ongoing swelling of LLE/calf, he underwent  LLE u/s on 8/11, which revealed a DVT with occlusive thrombus in the left peroneal vein.  He was placed on coumadin therapy, though says he only took one dose prior to this hospitalization.  More recently, he f/u with his podiatrist re: left foot cellulitis and abscess and an MRI was performed showing osteomyelitis of the proximal phalanx of the third toe on the left foot.  He was referred to the [facility] ED on 8/16 and admitted and placed on IV vancomycin and cipro as recommended by ID.  He was seen by general surgery and this AM underwent I & D.  While in OR, he was noted to develop T wave inversion on telemetry monitoring.  Post-op ECG now shows deep anterolateral downsloping ST depression and TWI, though the patient is asymptomatic. He denies any prior h/o chest pain, doe, or limitations in activity.  Up until June, when he stepped on the screw, he'd been working in [employer].  Inpatient Medications  . amLODipine  10 mg Oral Daily  . carvedilol  50 mg Oral BID  . ciprofloxacin  500 mg Oral Q18H  . cloNIDine  0.2 mg Oral Daily  . DAPTOmycin (CUBICIN)  IV  500 mg Intravenous Q24H  . furosemide  40 mg Oral Daily  . hydrALAZINE  100 mg Oral TID  . HYDROcodone-acetaminophen  1 tablet Oral BID  . insulin aspart  0-5 Units Subcutaneous QHS  . insulin aspart  0-9 Units Subcutaneous TID WC  . insulin glargine  16 Units Subcutaneous Q2200  . pantoprazole  40 mg Oral Daily  . sodium chloride  3 mL Intravenous Q12H  . topiramate  25 mg Oral BID    Family History Family History  Problem Relation Age of Onset  . Coronary artery disease Father   . Pancreatic cancer Mother   . Breast cancer Sister   . Lung cancer Brother   . Pancreatic cancer Mother      Social History Social History   Social History  . Marital Status: Single    Spouse Name: N/A  . Number of Children: 3  . Years of Education: N/A   Occupational History  . Not on file.   Social History Main Topics  . Smoking  status: Never Smoker   . Smokeless tobacco: Not on file  . Alcohol Use: No  . Drug Use: No  . Sexual Activity: No   Other Topics Concern  . Not on file   Social History Narrative   Single, lives alone, 3 children     Review of Systems  General:  No chills, fever, night sweats or weight changes.  Cardiovascular:  No chest pain, dyspnea on exertion, edema, orthopnea, palpitations, paroxysmal nocturnal dyspnea. Dermatological: No rash, lesions/masses Respiratory: No cough, dyspnea Urologic: No hematuria, dysuria Abdominal:   No nausea, vomiting, diarrhea, bright red blood per rectum, melena, or hematemesis Neurologic:  No visual changes, wkns, changes in mental status.  L foot numbness. All other systems reviewed and are otherwise negative except as noted above.  Physical Exam  Blood pressure 133/73, pulse 77, temperature 97.5 F (36.4 C), temperature source Oral, resp. rate 14, height 6\' 3"  (1.905 m), weight 195 lb 8 oz (88.678 kg), SpO2 100 %.  General: Pleasant, NAD Psych: Normal affect. Neuro: Alert and oriented X 3. Moves all extremities spontaneously. HEENT: Normal  Neck: Supple without bruits or JVD. Lungs:  Resp regular and unlabored, CTA. Heart: RRR no s3, s4, or murmurs. Abdomen: Soft, non-tender, non-distended, BS + x 4.  Extremities: No clubbing, cyanosis or edema. DP/PT/Radials 2+ on right.  Unable to assess distal pulses on Left 2/2 dsg.  Labs   Lab Results  Component Value Date   WBC 6.7 05/19/2015   HGB 8.7* 05/19/2015   HCT 28.2* 05/19/2015   MCV 80.0 05/19/2015   PLT 175 05/19/2015     Recent Labs Lab 05/21/15 0430  NA 141  K 3.9  CL 109  CO2 23  BUN 49*  CREATININE 2.78*  CALCIUM 8.6*  GLUCOSE 108*   Radiology/Studies  Mr Foot Left Wo Contrast  05/18/2015   CLINICAL DATA:  Injury on June 30th stepping on a nail. MRSA. Possible osteomyelitis. Deep vein thrombosis.  EXAM: MRI OF THE LEFT FOREFOOT WITHOUT CONTRAST  TECHNIQUE: Multiplanar,  multisequence MR imaging was performed. No intravenous contrast was administered.  COMPARISON:  04/05/2015  FINDINGS: As abnormal osseous edema primarily in the proximal phalanx of the third toe but also to a lesser extent in the heads of the second, third, and fourth metatarsals. There is spurring at the Lisfranc joint without Lisfranc joint malalignment, and the Lisfranc ligament appears intact.  Dorsal subcutaneous edema along the foot noted with edema tracking within along the plantar musculature of the foot. Focally confluent edema projects along the web space between the proximal phalanges of the third  and fourth toes, in the vicinity of what appears to potentially be a puncture wound or draining sinus site on image 15 series 5 along the plantar ball of the foot.  Trace effusion of the middle subtalar facet.  IMPRESSION: 1. 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 Possible osteomyelitis involving the heads of the second, third, and fourth metatarsals. 2. Phlegmon and potentially incipient abscess tracking in the web space between the third and fourth proximal phalanges, with small locules of gas in the soft tissues in this vicinity, and extensive soft tissue edema. Possible draining sinus tract, correlate with site of puncture. 3. Dorsal subcutaneous edema and edema infiltrating the plantar musculature of the foot, likely representing cellulitis and myositis, respectively.   Electronically Signed   By: Gaylyn Rong M.D.   On: 05/18/2015 19:07   US Venous Img Lower Unilateral Left  05/13/2015   CLINICAL DATA:  44 year old male with a history of left lower extremity pain  EXAM: LEFT LOWER EXTREMITY VENOUS DOPPLER ULTRASOUND  TECHNIQUE: Gray-scale sonography with graded compression, as well as color Doppler and duplex ultrasound were performed to evaluate the lower extremity deep venous systems from the level of the common femoral vein and including the common femoral, femoral, profunda  femoral, popliteal and calf veins including the posterior tibial, peroneal and gastrocnemius veins when visible. The superficial great saphenous vein was also interrogated. Spectral Doppler was utilized to evaluate flow at rest and with distal augmentation maneuvers in the common femoral, femoral and popliteal veins.  COMPARISON:  None.  FINDINGS: Contralateral Common Femoral Vein: Respiratory phasicity is normal and symmetric with the symptomatic side. No evidence of thrombus. Normal compressibility.  Common Femoral Vein: No evidence of thrombus. Normal compressibility, respiratory phasicity and response to augmentation.  Saphenofemoral Junction: No evidence of thrombus. Normal compressibility and flow on color Doppler imaging.  Profunda Femoral Vein: No evidence of thrombus. Normal compressibility and flow on color Doppler imaging.  Femoral Vein: No evidence of thrombus. Normal compressibility, respiratory phasicity and response to augmentation.  Popliteal Vein: No evidence of thrombus. Normal compressibility, respiratory phasicity and response to augmentation.  Calf Veins: Posterior tibial vein patent, with no thrombus, retained flow, incomplete compressibility.  The left peroneal vein demonstrates occlusive thrombus.  Superficial Great Saphenous Vein: No evidence of thrombus. Normal compressibility and flow on color Doppler imaging.  Venous Reflux:  None.  Other Findings: Superficial vein at the knee/upper calf thrombosed. It is uncertain if this communicates directly with the great saphenous vein or small saphenous vein.  IMPRESSION: Sonographic survey of the left lower extremity is positive for DVT, with occlusive thrombus in the left peroneal vein, which does not extend into the popliteal vein.  Sonographic survey also positive for superficial thrombophlebitis, with thrombosed vein at the level of the knee/upper calf.  Signed,  Yvone Neu. Loreta Ave, DO  [facility]  [facility]  Radiology   Electronically Signed   By: Gilmer Mor D.O.   On: 05/13/2015 17:18    ECG  RSR, 71, deep, new anterolateral downsloping ST depression with TWI along with ST dep/TWI in lead II.  ASSESSMENT AND PLAN  1.  Abnormal ECG:  Pt was noted to develop marked anterolateral ST depression and TWI while in OR today for I & D of left foot.  12 lead ECG confirms these findings which are concerning for ischemia, although patient is completely asymptomatic w/o c/p or dyspnea.  He does have a 20 yr h/o DM, HTN, and  CKD III and thus is at risk for CAD and subendocardial ischemia.  I will cycle troponins and order echo to assess EF and wall motion.  Add asa and statin.  Cont bb.  Heparinize if OK with surgery (especially in light of DVT dx on 8/11).  If CE neg and echo shows nl EF, would rec myoview at some point to risk stratify.  If he rules in or EF abnl, may need cath, though CKD III with elevated creat this admission would be prohibitive.  2.  L foot osteomyelitis:  S/p I & D.  Abx per ID/IM.  3.  Essential HTN: stable on multiple meds.  4.  CKD III:  Creat has been variable.  Up this AM.  Follow.  5.  LLE DVT:  DVT was dx by U/S on 8/11.  Was on coumadin for a day or so prior to admission.  INR was therapeutic on admission (2.08 on 8/17).  He has not been on any form of anticoagulation since admission.  Please add either therapeutic dosing heparin or lovenox for treatment of LLE DVT ASAP - if feasible from surgical standpoint.  He does not show any signs/symptoms of PE at this time.  If RV down on echo, would check V:Q scan.  6.  Type II DM:  Insulin mgmt per IM.  7.  H/o Hematemesis/duodenitis/gastritis/esophagitis:  Noted on EGD in July w/o source of bleeding.  Cont PPI.  Will have to follow H/H closely in setting of need for oral anticoagulation and baseline anemia.  If he develops recurrent hematemesis, would need IVC filter.  Signed, Nicolasa Ducking, NP 05/21/2015, 4:37 PM

## 2015-05-21 NOTE — Progress Notes (Signed)
MD Bensimhon was notified of Trop 0.05. No further concerns at this time.

## 2015-05-21 NOTE — H&P (View-Only) (Signed)
Reason for Consult: Osteomyelitis left third toe with abscess of the left forefoot Referring Physician: Dr. Chen  Juan L Desrosiers Jr. is an 44 y.o. male.  HPI: The patient relates a history of stepping on a screw at work approximately 1-1/2 months ago on June 30. He initially had a debridement performed by Dr. Fowler who is been following him outpatient. He has been on multiple rounds of antibiotics over that time with a continued ulceration beneath the forefoot. Recently had cultures taken which showed MRSA. There was concern for continued infection and attempt was made to have an outpatient MRI performed but this was delayed having to go to the Worker's Compensation process. He developed significant nausea and vomiting over the last couple of days and recommendation was to be seen in the emergency department for evaluation and MRI of the foot. MRI was performed earlier today which did reveal osteomyelitis in the third toe with continued abscess in the forefoot. Decision was made for hospitalization for IV antibiotics and debridement  Past Medical History  Diagnosis Date  . Diabetes mellitus without complication   . Hypertension   . Chronic kidney disease   . GERD (gastroesophageal reflux disease)   . Cellulitis and abscess of foot     Left-Dr. Fowler    Past Surgical History  Procedure Laterality Date  . No past surgeries    . I&d extremity Left 04/06/2015    Procedure: IRRIGATION AND DEBRIDEMENT EXTREMITY;  Surgeon: Justin Fowler, DPM;  Location: [facility] ORS;  Service: Podiatry;  Laterality: Left;  . Esophagogastroduodenoscopy N/A 04/07/2015    Procedure: ESOPHAGOGASTRODUODENOSCOPY (EGD);  Surgeon: Darren Wohl, MD;  Location: [facility] ENDOSCOPY;  Service: Endoscopy;  Laterality: N/A;  . Foot surgery      Family History  Problem Relation Age of Onset  . Coronary artery disease Father   . Pancreatic cancer Mother   . Breast cancer Sister   . Lung cancer Brother   . Pancreatic cancer Mother      Social History:  reports that he has never smoked. He does not have any smokeless tobacco history on file. He reports that he does not drink alcohol or use illicit drugs.  Allergies:  Allergies  Allergen Reactions  . Sulfur Other (See Comments)    Renal failure    Medications: I have reviewed the patient's current medications.  Results for orders placed or performed during the hospital encounter of 05/18/15 (from the past 48 hour(s))  Basic metabolic panel     Status: Abnormal   Collection Time: 05/18/15  1:57 PM  Result Value Ref Range   Sodium 142 135 - 145 mmol/L   Potassium 3.9 3.5 - 5.1 mmol/L   Chloride 111 101 - 111 mmol/L   CO2 21 (L) 22 - 32 mmol/L   Glucose, Bld 131 (H) 65 - 99 mg/dL   BUN 35 (H) 6 - 20 mg/dL   Creatinine, Ser 1.78 (H) 0.61 - 1.24 mg/dL   Calcium 9.5 8.9 - 10.3 mg/dL   GFR calc non Af Amer 44 (L) >60 mL/min   GFR calc Af Amer 51 (L) >60 mL/min    Comment: (NOTE) The eGFR has been calculated using the CKD EPI equation. This calculation has not been validated in all clinical situations. eGFR's persistently <60 mL/min signify possible Chronic Kidney Disease.    Anion gap 10 5 - 15  CBC with Differential     Status: Abnormal   Collection Time: 05/18/15  1:57 PM  Result Value Ref Range     WBC 5.7 3.8 - 10.6 K/uL   RBC 3.37 (L) 4.40 - 5.90 MIL/uL   Hemoglobin 8.4 (L) 13.0 - 18.0 g/dL   HCT 27.0 (L) 40.0 - 52.0 %   MCV 80.2 80.0 - 100.0 fL   MCH 25.0 (L) 26.0 - 34.0 pg   MCHC 31.2 (L) 32.0 - 36.0 g/dL   RDW 14.5 11.5 - 14.5 %   Platelets 165 150 - 440 K/uL   Neutrophils Relative % 70 %   Neutro Abs 4.0 1.4 - 6.5 K/uL   Lymphocytes Relative 20 %   Lymphs Abs 1.1 1.0 - 3.6 K/uL   Monocytes Relative 8 %   Monocytes Absolute 0.4 0.2 - 1.0 K/uL   Eosinophils Relative 2 %   Eosinophils Absolute 0.1 0 - 0.7 K/uL   Basophils Relative 0 %   Basophils Absolute 0.0 0 - 0.1 K/uL  Glucose, capillary     Status: Abnormal   Collection Time: 05/18/15   9:32 PM  Result Value Ref Range   Glucose-Capillary 111 (H) 65 - 99 mg/dL    Mr Foot Left Wo Contrast  05/18/2015   CLINICAL DATA:  Injury on June 30th stepping on a nail. MRSA. Possible osteomyelitis. Deep vein thrombosis.  EXAM: MRI OF THE LEFT FOREFOOT WITHOUT CONTRAST  TECHNIQUE: Multiplanar, multisequence MR imaging was performed. No intravenous contrast was administered.  COMPARISON:  04/05/2015  FINDINGS: As abnormal osseous edema primarily in the proximal phalanx of the third toe but also to a lesser extent in the heads of the second, third, and fourth metatarsals. There is spurring at the Lisfranc joint without Lisfranc joint malalignment, and the Lisfranc ligament appears intact.  Dorsal subcutaneous edema along the foot noted with edema tracking within along the plantar musculature of the foot. Focally confluent edema projects along the web space between the proximal phalanges of the third and fourth toes, in the vicinity of what appears to potentially be a puncture wound or draining sinus site on image 15 series 5 along the plantar ball of the foot.  Trace effusion of the middle subtalar facet.  IMPRESSION: 1. 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 Possible osteomyelitis involving the heads of the second, third, and fourth metatarsals. 2. Phlegmon and potentially incipient abscess tracking in the web space between the third and fourth proximal phalanges, with small locules of gas in the soft tissues in this vicinity, and extensive soft tissue edema. Possible draining sinus tract, correlate with site of puncture. 3. Dorsal subcutaneous edema and edema infiltrating the plantar musculature of the foot, likely representing cellulitis and myositis, respectively.   Electronically Signed   By: Van Clines M.D.   On: 05/18/2015 19:07    Review of Systems  Constitutional: Positive for chills.  HENT: Negative.   Eyes: Negative.   Respiratory: Negative.   Cardiovascular:  Negative.   Gastrointestinal: Positive for nausea and vomiting.  Genitourinary: Negative.   Musculoskeletal:       Pain in the left forefoot and toes.  Skin:       Swelling and redness in the left forefoot that has been persistent since his injury. Denies any current drainage from the wound  Neurological: Positive for weakness.       He does relate numbness and paresthesias in the feet related to his diabetes.  Endo/Heme/Allergies: Negative.   Psychiatric/Behavioral: Negative.    Blood pressure 195/100, pulse 118, temperature 98.7 F (37.1 C), temperature source Oral, resp. rate 19, height 6' 3" (1.905 m),  weight 88.678 kg (195 lb 8 oz), SpO2 100 %. Physical Exam  Cardiovascular:  DP and PT pulses are fully palpable. Capillary filling time intact to all digits.  Musculoskeletal:  Guarded range of motion in the forefoot joints on the left. Muscle testing is deferred. Pain on palpation around the third toe and metatarsal area.  Neurological:  There is loss of protective threshold with a monofilament wire from the ankle distal to the toes. Proprioception is impaired on the left foot. Appears intact on the right  Skin:  Skin is warm dry and supple. There is erythema and edema in the left forefoot around the base of the second third and fourth toes. An intact eschar is noted on the plantar aspect of the left foot at site of the puncture wound. No active drainage.    Assessment/Plan: Assessment: Osteomyelitis left third toe proximal phalanx with abscess and left forefoot  Plan: Discussed with the patient the need for debridement of the infected portion of bone in his left third toe as well as debridement of the left forefoot. Discussed the possibility of amputation of the third toe but at this point it appears clearly viable and he would like to pursue with just debridement of bone. The possibility of continued infection and further debridements or surgeries was discussed with the patient. We  will obtain a consent form for debridement of infected soft tissues and bone on the left foot and plan for this at some point tomorrow. Plan for the patient to be nothing by mouth after breakfast as it will most likely be tomorrow evening.  Fadia Marlar W. 05/18/2015, 10:18 PM      

## 2015-05-21 NOTE — OR Nursing (Signed)
SDS RN checked FSBS 84

## 2015-05-21 NOTE — Op Note (Signed)
Date of operation: 05/21/2015.  Surgeon: Ricci Barker DPM.  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  Postoperative diagnosis: Same.  Procedure: 1. I&D abscess left forefoot. 2. Debridement infected bone proximal phalanx left third toe.  Anesthesia: Local Mac.  Hemostasis: Pneumatic tourniquet left ankle 250 mmHg.  Estimated blood loss: Minimal.  Pathology: Bone left third toe.  Cultures: Bone cultures proximal phalanx toe.  Materials: Stimulan rapid cure beads impregnated with vancomycin.  Complications: None apparent.  Operative indications: This is a 44 year old male with a history of stepping on a screw on 04/01/2015. Initially had an I&D back in July but continued to have infection in the left foot. Recent cultures revealed MRSA growing from the left foot. An MRI was ordered which did reveal infection in the base of the proximal phalanx of the third toe with continued abscess and infected tissue in the left plantar forefoot. Decision was made for hospitalization with I&D and IV antibiotics.  Operative procedure: Patient was taken to the operating room and placed on the table in the supine position. Following satisfactory sedation the left foot was anesthetized with 10 cc of 0.5% bupivacaine plain around the third and fourth metatarsal region and forefoot. A pneumatic tourniquet was applied at the level of the left ankle and the foot was prepped and draped in usual sterile fashion. The foot was exsanguinated and the tourniquet was inflated to 250 mmHg.  Attention was then directed to the dorsal aspect of the left foot where an approximate 2.5 cm linear incision was made coursing proximal to distal over the third toe and metatarsal. The incision was deepened down to the level of the joint where a transverse tenotomy was performed at the level of the joint. There was noted to be some chronic infected tissue along  the medial shaft of the proximal phalanx. The base of the proximal phalanx was then transected using a pneumatic saw and removed in toto area the flexor tendon was sacrificed to help prevent contracture. The devitalized tissue along the shaft of the proximal phalanx was then grossly debrided through superficial and deep subcutaneous all the way down to the level of bone and excised sharply using a ronguer.   Next attention was directed to the plantar aspect of the left foot where a chronic draining wound was present beneath the third and fourth metatarsal area. An approximate 2 cm distal incision was made ending in the third webspace at the base of the third and fourth toes and a proximal incision from the ulceration approximately 3 cm in length in the interspace between the third and fourth metatarsals. The incision was deepened sharply down to the deep subcutaneous tissue. A large amount of infected devitalized tissue was noted just beneath the ulcerative area extending back to the base of the third and fourth toes as well as beneath the third metatarsal area. Gross excisional debridement was performed sharply using a ronguer through the deep and superficial subcutaneous tissues. Following gross debridement the remaining tissues were debrided using a versa jet on a setting of 6. Good healthy remaining tissues were noted at this point and the wound was then flushed with pulse irrigation utilizing 3 L of fluid. At this point a dorsal incision was closed using 4-0 nylon vertical mattress and simple interrupted sutures. The plantar incision was closed proximally and distally leaving the central portion open using 4-0 nylon simple interrupted sutures. The central hole was then opened and the antibiotic  beads were placed into the wound and then the remainder of the wound was closed leaving an approximate 5 mm hole for drainage and packing. Xeroform sterile gauze bandage and Kerlix were then applied. The tourniquet was  released and a second Kerlix and Ace wrap were then applied. Patient tolerated procedure and anesthesia well and was transported to the PACU with vital signs stable and in good condition.

## 2015-05-22 ENCOUNTER — Inpatient Hospital Stay: Payer: Worker's Compensation

## 2015-05-22 ENCOUNTER — Inpatient Hospital Stay
Admit: 2015-05-22 | Discharge: 2015-05-22 | Disposition: A | Discharge status: Home / Self Care | Payer: Worker's Compensation | Attending: Nurse Practitioner | Admitting: Nurse Practitioner

## 2015-05-22 DIAGNOSIS — R9431 Abnormal electrocardiogram [ECG] [EKG]: Secondary | ICD-10-CM | POA: Insufficient documentation

## 2015-05-22 LAB — GLUCOSE, CAPILLARY
GLUCOSE-CAPILLARY: 120 mg/dL — AB (ref 65–99)
Glucose-Capillary: 251 mg/dL — ABNORMAL HIGH (ref 65–99)
Glucose-Capillary: 163 mg/dL — ABNORMAL HIGH (ref 65–99)
Glucose-Capillary: 127 mg/dL — ABNORMAL HIGH (ref 65–99)

## 2015-05-22 LAB — C-REACTIVE PROTEIN: CRP: 1.9 mg/dL — AB (ref <1.0)

## 2015-05-22 LAB — TROPONIN I
TROPONIN I: 0.05 ng/mL — AB (ref <0.031)
TROPONIN I: 0.04 ng/mL — AB (ref <0.031)

## 2015-05-22 LAB — CREATININE, SERUM
Creatinine, Ser: 2.97 mg/dL — ABNORMAL HIGH (ref 0.61–1.24)
GFR calc non Af Amer: 24 mL/min — ABNORMAL LOW (ref >60)
GFR, EST AFRICAN AMERICAN: 28 mL/min — AB (ref >60)

## 2015-05-22 LAB — CK: Total CK: 20 U/L — ABNORMAL LOW (ref 49–397)

## 2015-05-22 MED ORDER — CIPROFLOXACIN HCL 500 MG PO TABS
500.0000 mg | ORAL_TABLET | Freq: Two times a day (BID) | ORAL | Status: DC
  Administered 2015-05-22 – 2015-05-25 (×6): 500 mg via ORAL
  Filled 2015-05-22 (×6): qty 1

## 2015-05-22 MED ORDER — SODIUM CHLORIDE 0.9 % IV SOLN
500.0000 mg | INTRAVENOUS | Status: DC
  Administered 2015-05-22 – 2015-05-23 (×2): 500 mg via INTRAVENOUS
  Filled 2015-05-22 (×3): qty 10

## 2015-05-22 NOTE — Care Management Note (Signed)
Case Management Note  Patient Details  Name: Juan Roberson. MRN: [id number] Date of Birth: [birth year]  Subjective/Objective:  Spoke with Shelle Iron from [facility] who is managing Juan Roberson  Discharge planning and implementation. Plan is to use the [facility] pharmacy and home health staff. Updated Kim that that the current plan is for Juan Roberson to have a PICC line put in today and receive his first IV dose of Daptomycin at [facility]. He will then be discharged home later today and home health will provide his next Daptomycin dose on Sunday. Will contact Kim again prior to Juan Roberson discharge. Luanna Cole to find out whether Daptomycin is available from either [facility] or other local pharmacies or if it needs to be ordered.                 Action/Plan:   Expected Discharge Date:                  Expected Discharge Plan:     In-House Referral:     Discharge planning Services  CM Consult  Post Acute Care Choice:    Choice offered to:  Patient, Adult Children (daughter Education officer, community)  DME Arranged:    DME Agency:     HH Arranged:    HH Agency:     Status of Service:  In process, will continue to follow  Medicare Important Message Given:    Date Medicare IM Given:    Medicare IM give by:    Date Additional Medicare IM Given:    Additional Medicare Important Message give by:     If discussed at Long Length of Stay Meetings, dates discussed:    Additional Comments:  Andrea Colglazier A, RN 05/22/2015, 11:38 AM

## 2015-05-22 NOTE — Progress Notes (Signed)
1 Day Post-Op  Subjective: Patient seen. Relates no complaints at all of pain. Doing well.  Objective: Vital signs in last 24 hours: Temp:  [97.5 F (36.4 C)-98.5 F (36.9 C)] 98 F (36.7 C) (08/20 1140) Pulse Rate:  [72-87] 72 (08/20 1140) Resp:  [14-22] 18 (08/20 1140) BP: (105-143)/(43-73) 105/43 mmHg (08/20 1140) SpO2:  [95 %-100 %] 95 % (08/20 1140) FiO2 (%):  [21 %] 21 % (08/19 1455) Last BM Date: 05/20/15 (per pt)  Intake/Output from previous day: 08/19 0701 - 08/20 0700 In: 600 [I.V.:600] Out: 10 [Blood:10] Intake/Output this shift:    Moderate bleeding on the bandage. Upon removal there is still some active bleeding from the plantar wound upon removal of the packing. Incisions are well coapted with minimal edema or erythema. Overall appears stable.  Lab Results:  No results for input(s): WBC, HGB, HCT, PLT in the last 72 hours. BMET  Recent Labs  05/21/15 0430 05/22/15 0436  NA 141  --   K 3.9  --   CL 109  --   CO2 23  --   GLUCOSE 108*  --   BUN 49*  --   CREATININE 2.78* 2.97*  CALCIUM 8.6*  --    PT/INR No results for input(s): LABPROT, INR in the last 72 hours. ABG No results for input(s): PHART, HCO3 in the last 72 hours.  Invalid input(s): PCO2, PO2  Studies/Results: No results found.  Anti-infectives: Anti-infectives    Start     Dose/Rate Route Frequency Ordered Stop   05/22/15 2000  ciprofloxacin (CIPRO) tablet 500 mg     500 mg Oral 2 times daily 05/22/15 1143     05/22/15 1800  DAPTOmycin (CUBICIN) 500 mg in sodium chloride 0.9 % IVPB     500 mg 220 mL/hr over 30 Minutes Intravenous Every 24 hours 05/22/15 1131     05/21/15 1900  vancomycin (VANCOCIN) 1,500 mg in sodium chloride 0.9 % 500 mL IVPB  Status:  Discontinued     1,500 mg 250 mL/hr over 120 Minutes Intravenous Every 24 hours 05/21/15 0846 05/21/15 1603   05/21/15 1700  DAPTOmycin (CUBICIN) 530 mg in sodium chloride 0.9 % IVPB  Status:  Discontinued     530 mg 221.2 mL/hr  over 30 Minutes Intravenous Every 24 hours 05/21/15 1613 05/21/15 1617   05/21/15 1700  DAPTOmycin (CUBICIN) 500 mg in sodium chloride 0.9 % IVPB  Status:  Discontinued     500 mg 220 mL/hr over 30 Minutes Intravenous Every 24 hours 05/21/15 1617 05/22/15 1131   05/21/15 1400  ciprofloxacin (CIPRO) tablet 500 mg  Status:  Discontinued     500 mg Oral Every 18 hours 05/21/15 0855 05/22/15 1143   05/20/15 2000  ciprofloxacin (CIPRO) tablet 500 mg  Status:  Discontinued     500 mg Oral 2 times daily 05/20/15 0933 05/21/15 0855   05/20/15 1600  vancomycin (VANCOCIN) 1,500 mg in sodium chloride 0.9 % 500 mL IVPB  Status:  Discontinued     1,500 mg 250 mL/hr over 120 Minutes Intravenous Every 24 hours 05/20/15 0533 05/21/15 0846   05/19/15 0800  ciprofloxacin (CIPRO) IVPB 400 mg  Status:  Discontinued     400 mg 200 mL/hr over 60 Minutes Intravenous Every 12 hours 05/18/15 2117 05/20/15 0933   05/19/15 0500  vancomycin (VANCOCIN) IVPB 1000 mg/200 mL premix  Status:  Discontinued     1,000 mg 200 mL/hr over 60 Minutes Intravenous Every 12 hours 05/18/15 2153 05/20/15  2956   05/18/15 2200  vancomycin (VANCOCIN) IVPB 1000 mg/200 mL premix     1,000 mg 200 mL/hr over 60 Minutes Intravenous  Once 05/18/15 2152 05/18/15 2351   05/18/15 2000  ciprofloxacin (CIPRO) IVPB 400 mg     400 mg 200 mL/hr over 60 Minutes Intravenous  Once 05/18/15 1959 05/18/15 2056   05/18/15 1945  vancomycin (VANCOCIN) IVPB 1000 mg/200 mL premix  Status:  Discontinued     1,000 mg 200 mL/hr over 60 Minutes Intravenous  Once 05/18/15 1937 05/18/15 2152   05/18/15 1945  piperacillin-tazobactam (ZOSYN) IVPB 3.375 g  Status:  Discontinued     3.375 g 12.5 mL/hr over 240 Minutes Intravenous  Once 05/18/15 1937 05/18/15 1959      Assessment/Plan: s/p Procedure(s): IRRIGATION AND DEBRIDEMENT FOOT (Left) Assessment: 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   Plan: The wound was repacked  with sterile saline gauze. Spoke with Dr. Seth Bake. previously about his Coumadin. At this point we'll hold off until tomorrow to reinitiate his Coumadin due to the bleeding present today. If cleared by cardiology he should be stable for discharge following initiation of his PICC line and IV antibiotics. Home health care should change the packing once a day with sterile saline gauze packed into the wound. Plan for follow-up this coming week in the office. If not discharged today we will follow tomorrow.  LOS: 4 days    Velinda Wrobel W. 05/22/2015

## 2015-05-22 NOTE — Progress Notes (Signed)
PICC team here - requesting to do IJ PICC due to creatinine levels, Dr. Winona Legato called - she is okay with that and states patient only needs 1 lumen for the antibiotics

## 2015-05-22 NOTE — Progress Notes (Addendum)
[facility] [place] [facility]   PATIENT NAME: Juan Roberson    MR#:  [id number]  DATE OF BIRTH:  1970/10/10  SUBJECTIVE:  CHIEF COMPLAINT:   Chief Complaint  Patient presents with  . Foot Pain   Patient here with left foot pain and swelling and MRI findings suggestive of osteomyelitis on the left third toe. Also noted to have an abscess between the 3rd & 4th toe web space. S/p incision and drainage of left forefoot abscess and debridement of infected bone of proximal phalanx of left third toe 19th of August 2016 by Dr. Alberteen Spindle. Patient denies any significant pains and feels comfortable. He wants to go home. Echocardiogram is being planned and PICC line will be placed later today for daptomycin. Cardiology, however recommends echocardiogram as decision-making to for stress test to 4/22 of August 2016 versus cardiac catheterization.    REVIEW OF SYSTEMS:    Review of Systems  Constitutional: Negative for fever and chills.  HENT: Negative for congestion and tinnitus.   Eyes: Negative for blurred vision and double vision.  Respiratory: Negative for cough, shortness of breath and wheezing.   Cardiovascular: Negative for chest pain, orthopnea and PND.  Gastrointestinal: Negative for nausea, vomiting, abdominal pain and diarrhea.  Genitourinary: Negative for dysuria and hematuria.  Musculoskeletal: Positive for joint pain (left foot pain).  Neurological: Negative for dizziness, sensory change and focal weakness.  All other systems reviewed and are negative.   Nutrition: Diabetic Tolerating Diet: Yes Tolerating PT: Ambulatory  DRUG ALLERGIES:   Allergies  Allergen Reactions  . Sulfur Other (See Comments)    Renal failure    VITALS:  Blood pressure 105/43, pulse 72, temperature 98 F (36.7 C), temperature source Oral, resp. rate 18, height 6\' 3"  (1.905 m), weight 88.678 kg (195 lb 8 oz), SpO2 95 %.  PHYSICAL EXAMINATION:   Physical Exam  GENERAL:   44 y.o.-year-old patient lying in the bed with no acute distress.  EYES: Pupils equal, round, reactive to light and accommodation. No scleral icterus. Extraocular muscles intact.  HEENT: Head atraumatic, normocephalic. Oropharynx and nasopharynx clear.  NECK:  Supple, no jugular venous distention. No thyroid enlargement, no tenderness.  LUNGS: Normal breath sounds bilaterally, no wheezing, rales, rhonchi. No use of accessory muscles of respiration.  CARDIOVASCULAR: S1, S2 normal. No murmurs, rubs, or gallops.  ABDOMEN: Soft, nontender, nondistended. Bowel sounds present. No organomegaly or mass.  EXTREMITIES: No cyanosis, clubbing or edema b/l.  Left plantar foot swelling, redness, open sore with minimal drainage.     NEUROLOGIC: Cranial nerves II through XII are intact. No focal Motor or sensory deficits b/l.   PSYCHIATRIC: The patient is alert and oriented x 3. Good affect SKIN: No obvious rash, lesion, or left foot plantar ulcer. Left foot is in the dressing. No bleeding, discharge was noted   LABORATORY PANEL:   CBC  Recent Labs Lab 05/19/15 0514  WBC 6.7  HGB 8.7*  HCT 28.2*  PLT 175   ------------------------------------------------------------------------------------------------------------------  Chemistries   Recent Labs Lab 05/21/15 0430 05/22/15 0436  NA 141  --   K 3.9  --   CL 109  --   CO2 23  --   GLUCOSE 108*  --   BUN 49*  --   CREATININE 2.78* 2.97*  CALCIUM 8.6*  --    ------------------------------------------------------------------------------------------------------------------  Cardiac Enzymes  Recent Labs Lab 05/22/15 1034  TROPONINI 0.04*   ------------------------------------------------------------------------------------------------------------------  RADIOLOGY:  No results found.   ASSESSMENT  AND PLAN:   44 year old male with history of diabetes, hypertension, chronic kidney disease stage III, GERD, history of left foot  cellulitis who presents to the hospital but the left foot swelling and redness and pain.  #1 left foot abscess as well as a osteomyelitis of the third toe proximal phalanx, status post incision and debridement and drainage of abscess , PICC line will be placed and patient will be continued on daptomycin per Dr. Jarrett Ables recommendations . Patient will need 4 weeks therapy was likely oral antibiotics afterwards. He is also to be continued on ciprofloxacin orally    #2 chronic kidney disease stage III-patient's creatinine is close to baseline. We'll continue to monitor renal function.  #3 hypertension-continue clonidine, Coreg, amlodipine. Blood pressure is relatively well controlled  #4 diabetic neuropathy-continue Neurontin, Lyrica.   #5 diabetes with renal complication-continue Lantus, sliding scale insulin.Fasting blood glucose level is 120 today  #6 GERD-continue Protonix.  # 7. Elevated troponin. Appreciate cardiology involvement. Patient is to continue aspirin, Lipitor, Coreg, he is getting echocardiogram and if patient's echo is remarkable for normal ejection fraction. He will undergo stress test on Monday 27th of August 2016. However, patient patient's echocardiogram is abnormal, patient will go to cardiac catheterization per cardiology   #8  Recent diagnosis of left lower extremity DVT patient is to be started on Coumadin therapy for that provided by that is Dr. Alberteen Spindle is okay with this . Apparently patient had bleeding and Dr. Alberteen Spindle feels that Coumadin should be restarted at a later time, possibly tomorrow. Upon further evaluation, it appears the patient did have peroneal vein thrombosis, not extending to popliteal, also superficial thrombophlebitis at the knee up or call the area, attempted to reach Dr. Evie Lacks, however, not able to we will discuss with vascular surgery therapy options   Likely d/c home with home health nursing after PICC line placed.   All the records are reviewed and  case discussed with Care Management/Social Workerr. Management plans discussed with the patient, family and they are in agreement.  CODE STATUS: Full code  DVT Prophylaxis: Ambulatory  TOTAL TIME TAKING CARE OF THIS PATIENT: .  Discussed with cardiology, dr Alberteen Spindle POSSIBLE D/C IN 1-2 DAYS, DEPENDING ON CLINICAL CONDITION.   Katharina Caper M.D on 05/22/2015 at 11:46 AM  Between 7am to 6pm - Pager - [phone]  After 6pm go to [email or web address] - password [email or web address] [facility]  [facility]  Office  [phone]  CC: Primary care physician; Corky Downs, MD

## 2015-05-22 NOTE — Progress Notes (Signed)
Dr. Winona Legato notified that patient has slight fever of 100.2 - orders to go ahead and give tylenol

## 2015-05-22 NOTE — Progress Notes (Signed)
ANTIBIOTIC CONSULT NOTE - INITIAL  Pharmacy Consult for Daptomycin Indication: Osteomyelitis  Allergies  Allergen Reactions  . Sulfur Other (See Comments)    Renal failure    Patient Measurements: Height:  (190.5 cm) Weight: 195 lb 8 oz (88.678 kg) IBW/kg (Calculated) : 84.5   Vital Signs: Temp: 98 F (36.7 C) (08/20 1140) Temp Source: Oral (08/20 1140) BP: 105/43 mmHg (08/20 1140) Pulse Rate: 72 (08/20 1140) Intake/Output from previous day: 08/19 0701 - 08/20 0700 In: 600 [I.V.:600] Out: 10 [Blood:10] Intake/Output from this shift:    Labs:  Recent Labs  05/20/15 0444 05/21/15 0430 05/22/15 0436  CREATININE 2.27* 2.78* 2.97*   Estimated Creatinine Clearance: 37.1 mL/min (by C-G formula based on Cr of 2.97).  Recent Labs  05/20/15 0444  VANCOTROUGH 29*     Microbiology: Recent Results (from the past 720 hour(s))  Wound culture     Status: None   Collection Time: 05/13/15  2:50 AM  Result Value Ref Range Status   Specimen Description FOOT  Final   Special Requests NONE  Final   Gram Stain   Final    FEW WBC SEEN FEW GRAM POSITIVE COCCI RARE GRAM NEGATIVE RODS    Culture   Final    MODERATE GROWTH METHICILLIN RESISTANT STAPHYLOCOCCUS AUREUS MODERATE GROWTH GROUP B STREP(S.AGALACTIAE)ISOLATED CRITICAL RESULT CALLED TO, READ BACK BY AND VERIFIED WITH: LAURIE WOODS AR 1515 05/17/15 CTJ Virtually 100% of S. agalactiae (Group B) strains are susceptible to Penicillin.  For Penicillin-allergic patients, Erythromycin (85-95% sensitive) and Clindamycin (80% sensitive) are drugs of choice. Contact microbiology lab to request sensitivities if  needed within 7 days.    Report Status 05/18/2015 FINAL  Final   Organism ID, Bacteria METHICILLIN RESISTANT STAPHYLOCOCCUS AUREUS  Final      Susceptibility   Methicillin resistant staphylococcus aureus - MIC*    CIPROFLOXACIN >=8 RESISTANT Resistant     GENTAMICIN <=0.5 SENSITIVE Sensitive     OXACILLIN Value in  next row Resistant      >=4 MODERATELY RESISTANT    VANCOMYCIN <=0.5 SENSITIVE Sensitive     TRIMETH/SULFA <=10 SENSITIVE Sensitive     CEFOXITIN SCREEN Value in next row Resistant      POSITIVECEFOXITIN SCREEN - This test may be used to predict mecA-mediated oxacillin resistance, and it is based on the cefoxitin disk screen test.  The cefoxitin screen and oxacillin work in combination to determine the final interpretation reported for oxacillin.     Inducible Clindamycin Value in next row Sensitive      POSITIVECEFOXITIN SCREEN - This test may be used to predict mecA-mediated oxacillin resistance, and it is based on the cefoxitin disk screen test.  The cefoxitin screen and oxacillin work in combination to determine the final interpretation reported for oxacillin.     ERYTHROMYCIN Value in next row Resistant      RESISTANT>=8    TETRACYCLINE Value in next row Sensitive      SENSITIVE<=1    CLINDAMYCIN Value in next row Sensitive      SENSITIVE<=0.25    LINEZOLID Value in next row Sensitive      SENSITIVE2    * MODERATE GROWTH METHICILLIN RESISTANT STAPHYLOCOCCUS AUREUS  Anaerobic culture     Status: None   Collection Time: 05/13/15  2:50 AM  Result Value Ref Range Status   Specimen Description FOOT  Final   Special Requests NONE  Final   Culture NO ANAEROBES ISOLATED  Final   Report Status 05/20/2015 FINAL  Final  Blood culture (routine x 2)     Status: None (Preliminary result)   Collection Time: 05/18/15  8:01 PM  Result Value Ref Range Status   Specimen Description BLOOD RIGHT ARM  Final   Special Requests BOTTLES DRAWN AEROBIC AND ANAEROBIC 2CC  Final   Culture NO GROWTH 4 DAYS  Final   Report Status PENDING  Incomplete  Blood culture (routine x 2)     Status: None (Preliminary result)   Collection Time: 05/18/15  8:01 PM  Result Value Ref Range Status   Specimen Description BLOOD LEFT HAND  Final   Special Requests BOTTLES DRAWN AEROBIC AND ANAEROBIC 3CC  Final   Culture NO  GROWTH 4 DAYS  Final   Report Status PENDING  Incomplete  Wound culture     Status: None (Preliminary result)   Collection Time: 05/21/15  2:07 PM  Result Value Ref Range Status   Specimen Description WOUND  Final   Special Requests NONE  Final   Gram Stain PENDING  Incomplete   Culture NO GROWTH < 24 HOURS  Final   Report Status PENDING  Incomplete    Medical History: Past Medical History  Diagnosis Date  . Diabetes mellitus without complication     a. Dx ~ 1996.  . Essential hypertension   . CKD (chronic kidney disease), stage III   . GERD (gastroesophageal reflux disease)   . Cellulitis and abscess of foot     Left-Dr. Ether Griffins  . Osteomyelitis     a. 05/2015 L foot.  . Left leg DVT     a. Dx 05/2015 -> Coumadin.  . Gastritis     a. 04/2015 hematemesis -> EGD: gastritis, esophagitis, duodenitis.  No active bleeding.  PPI added.    Medications:  Scheduled:  . amLODipine  10 mg Oral Daily  . aspirin  81 mg Oral Daily  . atorvastatin  80 mg Oral q1800  . carvedilol  50 mg Oral BID  . ciprofloxacin  500 mg Oral Q18H  . cloNIDine  0.2 mg Oral Daily  . DAPTOmycin (CUBICIN)  IV  500 mg Intravenous Q24H  . furosemide  40 mg Oral Daily  . hydrALAZINE  100 mg Oral TID  . HYDROcodone-acetaminophen  1 tablet Oral BID  . insulin aspart  0-5 Units Subcutaneous QHS  . insulin aspart  0-9 Units Subcutaneous TID WC  . insulin glargine  16 Units Subcutaneous Q2200  . pantoprazole  40 mg Oral Daily  . sodium chloride  3 mL Intravenous Q12H  . 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 ID wants to use daptomycin due to patient with CKD and will need 4 weeks of iv abx. Patient is also ordered  ciprofloxacin 500mg  PO Q12hr.    Goal of Therapy:  Resolution of infection  Plan:  Continue daptomycin 500 mg iv q 24 hours. Will monitor renal function closely and make sure that CPK is checked weekly. Will recheck serum creatinine with am labs on 8/22.    Pharmacy will continue to monitor and adjust per consult.   Simpson,Michael L 05/22/2015,11:41 AM

## 2015-05-22 NOTE — Anesthesia Post-op Follow-up Note (Signed)
  Anesthesia Pain Follow-up Note  Patient: Juan Roberson.  Day #: 1  Date of Follow-up: 05/22/2015 Time: 7:34 AM  Last Vitals:  Filed Vitals:   05/22/15 0416  BP: 140/68  Pulse: 83  Temp: 36.9 C  Resp: 18    Level of Consciousness: alert  Pain: none   Side Effects:None  Catheter Site Exam:clean, dry, no drainage  Plan: D/C from anesthesia care  Basilio Cairo

## 2015-05-22 NOTE — Progress Notes (Signed)
Patient: Juan Roberson. / Admit Date: 05/18/2015 / Date of Encounter: 05/22/2015, 10:17 AM   Subjective: No chest pain, SOB, palpitations, nausea, or vomiting. Echo is pending. Has ambulated in his room without issues.   Review of Systems: Review of Systems  Constitutional: Negative for fever, chills, weight loss and malaise/fatigue.  HENT: Negative for congestion.   Eyes: Negative for discharge and redness.  Respiratory: Negative for cough, hemoptysis, sputum production, shortness of breath and wheezing.   Cardiovascular: Negative for chest pain, palpitations, orthopnea, claudication, leg swelling and PND.  Gastrointestinal: Negative for nausea and vomiting.  Musculoskeletal: Positive for joint pain. Negative for falls.  Skin: Negative for rash.  Neurological: Negative for sensory change, focal weakness and weakness.    Objective: Telemetry: NSR 70s Physical Exam: Blood pressure 133/64, pulse 75, temperature 98.3 F (36.8 C), temperature source Oral, resp. rate 16, height 6\' 3"  (1.905 m), weight 195 lb 8 oz (88.678 kg), SpO2 97 %. Body mass index is 24.44 kg/(m^2). General: Well developed, well nourished, in no acute distress. Head: Normocephalic, atraumatic, sclera non-icteric, no xanthomas, nares are without discharge. Neck: Negative for carotid bruits. JVP not elevated. Lungs: Clear bilaterally to auscultation without wheezes, rales, or rhonchi. Breathing is unlabored. Heart: RRR S1 S2 without murmurs, rubs, or gallops.  Abdomen: Soft, non-tender, non-distended with normoactive bowel sounds. No rebound/guarding. Extremities: No clubbing or cyanosis. No edema. Left foot bandaged.  Neuro: Alert and oriented X 3. Moves all extremities spontaneously. Psych:  Responds to questions appropriately with a normal affect.   Intake/Output Summary (Last 24 hours) at 05/22/15 1017 Last data filed at 05/22/15 0900  Gross per 24 hour  Intake    600 ml  Output     10 ml  Net    590  ml    Inpatient Medications:  . amLODipine  10 mg Oral Daily  . aspirin  81 mg Oral Daily  . atorvastatin  80 mg Oral q1800  . carvedilol  50 mg Oral BID  . ciprofloxacin  500 mg Oral Q18H  . cloNIDine  0.2 mg Oral Daily  . DAPTOmycin (CUBICIN)  IV  500 mg Intravenous Q24H  . furosemide  40 mg Oral Daily  . hydrALAZINE  100 mg Oral TID  . HYDROcodone-acetaminophen  1 tablet Oral BID  . insulin aspart  0-5 Units Subcutaneous QHS  . insulin aspart  0-9 Units Subcutaneous TID WC  . insulin glargine  16 Units Subcutaneous Q2200  . pantoprazole  40 mg Oral Daily  . sodium chloride  3 mL Intravenous Q12H  . topiramate  25 mg Oral BID   Infusions:    Labs:  Recent Labs  05/21/15 0430 05/22/15 0436  NA 141  --   K 3.9  --   CL 109  --   CO2 23  --   GLUCOSE 108*  --   BUN 49*  --   CREATININE 2.78* 2.97*  CALCIUM 8.6*  --    No results for input(s): AST, ALT, ALKPHOS, BILITOT, PROT, ALBUMIN in the last 72 hours. No results for input(s): WBC, NEUTROABS, HGB, HCT, MCV, PLT in the last 72 hours.  Recent Labs  05/21/15 1558 05/22/15 0436  CKTOTAL  --  20*  TROPONINI 0.05* 0.05*   Invalid input(s): POCBNP No results for input(s): HGBA1C in the last 72 hours.   Weights: Filed Weights   05/18/15 1205 05/18/15 2141  Weight: 199 lb (90.266 kg) 195 lb 8 oz (88.678 kg)  Radiology/Studies:  Mr Foot Left Wo Contrast  05/18/2015   CLINICAL DATA:  Injury on June 30th stepping on a nail. MRSA. Possible osteomyelitis. Deep vein thrombosis.  EXAM: MRI OF THE LEFT FOREFOOT WITHOUT CONTRAST  TECHNIQUE: Multiplanar, multisequence MR imaging was performed. No intravenous contrast was administered.  COMPARISON:  04/05/2015  FINDINGS: As abnormal osseous edema primarily in the proximal phalanx of the third toe but also to a lesser extent in the heads of the second, third, and fourth metatarsals. There is spurring at the Lisfranc joint without Lisfranc joint malalignment, and the  Lisfranc ligament appears intact.  Dorsal subcutaneous edema along the foot noted with edema tracking within along the plantar musculature of the foot. Focally confluent edema projects along the web space between the proximal phalanges of the third and fourth toes, in the vicinity of what appears to potentially be a puncture wound or draining sinus site on image 15 series 5 along the plantar ball of the foot.  Trace effusion of the middle subtalar facet.  IMPRESSION: 1. 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 Possible osteomyelitis involving the heads of the second, third, and fourth metatarsals. 2. Phlegmon and potentially incipient abscess tracking in the web space between the third and fourth proximal phalanges, with small locules of gas in the soft tissues in this vicinity, and extensive soft tissue edema. Possible draining sinus tract, correlate with site of puncture. 3. Dorsal subcutaneous edema and edema infiltrating the plantar musculature of the foot, likely representing cellulitis and myositis, respectively.   Electronically Signed   By: Gaylyn Rong M.D.   On: 05/18/2015 19:07   US Venous Img Lower Unilateral Left  05/13/2015   CLINICAL DATA:  44 year old male with a history of left lower extremity pain  EXAM: LEFT LOWER EXTREMITY VENOUS DOPPLER ULTRASOUND  TECHNIQUE: Gray-scale sonography with graded compression, as well as color Doppler and duplex ultrasound were performed to evaluate the lower extremity deep venous systems from the level of the common femoral vein and including the common femoral, femoral, profunda femoral, popliteal and calf veins including the posterior tibial, peroneal and gastrocnemius veins when visible. The superficial great saphenous vein was also interrogated. Spectral Doppler was utilized to evaluate flow at rest and with distal augmentation maneuvers in the common femoral, femoral and popliteal veins.  COMPARISON:  None.  FINDINGS: Contralateral  Common Femoral Vein: Respiratory phasicity is normal and symmetric with the symptomatic side. No evidence of thrombus. Normal compressibility.  Common Femoral Vein: No evidence of thrombus. Normal compressibility, respiratory phasicity and response to augmentation.  Saphenofemoral Junction: No evidence of thrombus. Normal compressibility and flow on color Doppler imaging.  Profunda Femoral Vein: No evidence of thrombus. Normal compressibility and flow on color Doppler imaging.  Femoral Vein: No evidence of thrombus. Normal compressibility, respiratory phasicity and response to augmentation.  Popliteal Vein: No evidence of thrombus. Normal compressibility, respiratory phasicity and response to augmentation.  Calf Veins: Posterior tibial vein patent, with no thrombus, retained flow, incomplete compressibility.  The left peroneal vein demonstrates occlusive thrombus.  Superficial Great Saphenous Vein: No evidence of thrombus. Normal compressibility and flow on color Doppler imaging.  Venous Reflux:  None.  Other Findings: Superficial vein at the knee/upper calf thrombosed. It is uncertain if this communicates directly with the great saphenous vein or small saphenous vein.  IMPRESSION: Sonographic survey of the left lower extremity is positive for DVT, with occlusive thrombus in the left peroneal vein, which does not extend into the popliteal vein.  Sonographic survey also positive for superficial thrombophlebitis, with thrombosed vein at the level of the knee/upper calf.  Signed,  Yvone Neu. Loreta Ave, DO  [facility]  [facility]   Electronically Signed   By: Gilmer Mor D.O.   On: 05/13/2015 17:18     Assessment and Plan   1. Abnormal ECG: Pt was noted to develop marked anterolateral ST depression and TWI while in OR on 8/19 for I & D of left foot. 12 lead ECG confirms these findings which are concerning for ischemia, although patient is completely asymptomatic w/o  c/p or dyspnea. He does have a 20 yr h/o DM, HTN, and CKD III and thus is at risk for CAD and subendocardial ischemia. Cycled troponins have been flat trending at 0.05 x 2.  Echo is pending to assess EF and wall motion. Continue asa and statin. Cont bb. If troponins increase add IV heparin. If CE neg and echo shows nl EF, would rec inpatient myoview on Monday 8/22. If he rules in or EF abnl, plan for inpatient cardiac cath on 8/22, though with CKD III would need to be cautious with contrast.  2. L foot osteomyelitis: S/p I & D. Abx per ID/IM.  3. Essential HTN: stable on multiple meds.  4. CKD III: Creat has been variable. Up this AM. Follow. Per IM.   5. LLE DVT: DVT was dx by U/S on 8/11. Was on coumadin for a day or so prior to admission. INR was therapeutic on admission (2.08 on 8/17). He has not been on any form of anticoagulation since admission. Please add either therapeutic dosing heparin or lovenox for treatment of LLE DVT ASAP - if feasible from surgical standpoint. He does not show any signs/symptoms of PE at this time. If RV down on echo, would check V:Q scan.  6. Type II DM: Insulin mgmt per IM.  7. H/o Hematemesis/duodenitis/gastritis/esophagitis: Noted on EGD in July w/o source of bleeding. Cont PPI. Will have to follow H/H closely in setting of need for oral anticoagulation and baseline anemia. If he develops recurrent hematemesis, would need IVC filter.   SignedEula Listen, PA-C Pager: [phone] 05/22/2015, 10:17 AM  Patient seen, examined. Available data reviewed. Agree with findings, assessment, and plan as outlined by Eula Listen, PA-C. The patient was independently interviewed and examined. Heart is regular rate and rhythm without murmur or gallop. Lung fields are clear. There is no peripheral edema. Laboratory and radiographic findings reviewed. I have personally reviewed the patient's echo images. He has mild LV dysfunction with an LVEF of  50%. There is a restrictive filling pattern. I think the patient should remain hospitalized for an inpatient nuclear stress test Monday morning as outlined above. Discussed the plan with the patient who understands and agrees. Will continue his current medical program. 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 He is on an appropriate medical program with aspirin, a high intensity statin drug, and carvedilol. I do not think he is a good candidate for an ACE inhibitor with stage IV chronic kidney disease and GFR less than 30. Considering his renal dysfunction, probably best to avoid cardiac catheterization/contrast administration unless his nuclear stress test is in the moderate to high risk category.  Tonny Bollman, M.D. 05/22/2015 1:25 PM

## 2015-05-22 NOTE — Progress Notes (Signed)
Per Harriett Sine RN [facility]) - line is in appropriate place (xray verification) and ready to use.

## 2015-05-22 NOTE — Progress Notes (Signed)
Dr. Vaickute awarWinona LegatoInfectious Disease recommendation for PICC for prolonged IV ABX - MD informed patient, consent for PICC signed. MD to discuss home health options with care manager. [facility] notified of need for PICC placement. Per Ginger at [facility], Harriett Sine RN to be here around 12:30.

## 2015-05-22 NOTE — Care Management Note (Signed)
Case Management Note  Patient Details  Name: Juan Roberson. MRN: [id number] Date of Birth: Mar 28, 1971  Subjective/Objective:   Discussed current discharge plan with Ladona Ridgel, RN who reports that Juan Roberson is pending various tests and will most likely not be discharged until Monday 05/24/15 if his stress test shows need for a cardiac cath. Shelle Iron at [employer] was notified.                  Action/Plan:   Expected Discharge Date:                  Expected Discharge Plan:     In-House Referral:     Discharge planning Services  CM Consult  Post Acute Care Choice:    Choice offered to:  Patient, Adult Children (daughter Education officer, community)  DME Arranged:    DME Agency:     HH Arranged:    HH Agency:     Status of Service:  In process, will continue to follow  Medicare Important Message Given:    Date Medicare IM Given:    Medicare IM give by:    Date Additional Medicare IM Given:    Additional Medicare Important Message give by:     If discussed at Long Length of Stay Meetings, dates discussed:    Additional Comments:  Josealberto Montalto A, RN 05/22/2015, 12:51 PM

## 2015-05-22 NOTE — Anesthesia Postprocedure Evaluation (Signed)
  Anesthesia Post-op Note  Patient: Juan Roberson.  Procedure(s) Performed: Procedure(s): IRRIGATION AND DEBRIDEMENT FOOT (Left)  Anesthesia type:General  Patient location: PACU  Post pain: Pain level controlled  Post assessment: Post-op Vital signs reviewed, Patient's Cardiovascular Status Stable, Respiratory Function Stable, Patent Airway and No signs of Nausea or vomiting  Post vital signs: Reviewed and stable  Last Vitals:  Filed Vitals:   05/22/15 0416  BP: 140/68  Pulse: 83  Temp: 36.9 C  Resp: 18    Level of consciousness: awake, alert  and patient cooperative  Complications: No apparent anesthesia complications

## 2015-05-23 DIAGNOSIS — I429 Cardiomyopathy, unspecified: Secondary | ICD-10-CM

## 2015-05-23 LAB — CULTURE, BLOOD (ROUTINE X 2)
CULTURE: NO GROWTH
Culture: NO GROWTH

## 2015-05-23 LAB — BASIC METABOLIC PANEL
ANION GAP: 7 (ref 5–15)
BUN: 63 mg/dL — ABNORMAL HIGH (ref 6–20)
CHLORIDE: 109 mmol/L (ref 101–111)
CO2: 20 mmol/L — AB (ref 22–32)
Calcium: 8.1 mg/dL — ABNORMAL LOW (ref 8.9–10.3)
Creatinine, Ser: 3.58 mg/dL — ABNORMAL HIGH (ref 0.61–1.24)
GFR calc non Af Amer: 19 mL/min — ABNORMAL LOW (ref >60)
GFR, EST AFRICAN AMERICAN: 22 mL/min — AB (ref >60)
Glucose, Bld: 125 mg/dL — ABNORMAL HIGH (ref 65–99)
Potassium: 4.6 mmol/L (ref 3.5–5.1)
Sodium: 136 mmol/L (ref 135–145)

## 2015-05-23 LAB — GLUCOSE, CAPILLARY
GLUCOSE-CAPILLARY: 163 mg/dL — AB (ref 65–99)
Glucose-Capillary: 119 mg/dL — ABNORMAL HIGH (ref 65–99)

## 2015-05-23 MED ORDER — ASPIRIN EC 325 MG PO TBEC
325.0000 mg | DELAYED_RELEASE_TABLET | Freq: Every day | ORAL | Status: DC
  Administered 2015-05-23 – 2015-05-25 (×3): 325 mg via ORAL
  Filled 2015-05-23 (×3): qty 1

## 2015-05-23 MED ORDER — SODIUM CHLORIDE 0.9 % IV SOLN: 250.0000 mL | INTRAVENOUS | Status: DC | PRN

## 2015-05-23 MED ORDER — SODIUM CHLORIDE 0.9 % IJ SOLN: 10.0000 mL | INTRAMUSCULAR | Status: DC | PRN

## 2015-05-23 MED ORDER — SODIUM CHLORIDE 0.9 % IJ SOLN
10.0000 mL | Freq: Two times a day (BID) | INTRAMUSCULAR | Status: DC
  Administered 2015-05-23 – 2015-05-24 (×4): 10 mL

## 2015-05-23 MED ORDER — CARVEDILOL 25 MG PO TABS
25.0000 mg | ORAL_TABLET | Freq: Two times a day (BID) | ORAL | Status: DC
  Administered 2015-05-23 – 2015-05-25 (×5): 25 mg via ORAL
  Filled 2015-05-23 (×5): qty 1

## 2015-05-23 MED ORDER — HYDRALAZINE HCL 50 MG PO TABS
50.0000 mg | ORAL_TABLET | Freq: Three times a day (TID) | ORAL | Status: DC
  Administered 2015-05-23 (×2): 50 mg via ORAL
  Filled 2015-05-23 (×4): qty 1

## 2015-05-23 NOTE — Progress Notes (Signed)
Patient resting quietly with family at bedside. Complaints of pain early this morning relieved by scheduled pain meds. No other complaints today. Patient to have stress test tomorrow.

## 2015-05-23 NOTE — Progress Notes (Signed)
MD made aware of pt's BP 109/50, MD, Dr. Anne Hahn, gave orders to hold coreg & hydralazine this dose. No other orders. Will continue to monitor. Shirley Friar, RN

## 2015-05-23 NOTE — Progress Notes (Signed)
Patient: Juan Roberson. / Admit Date: 05/18/2015 / Date of Encounter: 05/23/2015, 8:16 AM   Subjective: Feels well this morning. No chest pain, SOB, or palpitations. Has ambulated in room with issues. BP somewhat soft overnight into the low 100s systolic. Coreg and hydralazine were held. BP has responded back into the 120s systolic this morning. Troponin trend 0.05-->0.05-->0.04. He is for [employer] 8/22.   Review of Systems: Review of Systems  Constitutional: Negative for fever, chills, weight loss, malaise/fatigue and diaphoresis.  Eyes: Negative for discharge and redness.  Respiratory: Negative for cough, hemoptysis, sputum production, shortness of breath and wheezing.   Cardiovascular: Negative for chest pain, palpitations, orthopnea, claudication, leg swelling and PND.  Gastrointestinal: Negative for nausea and vomiting.  Musculoskeletal: Negative for myalgias and falls.  Skin: Negative for rash.  Neurological: Negative for sensory change, speech change, focal weakness and weakness.  Endo/Heme/Allergies: Does not bruise/bleed easily.  Psychiatric/Behavioral: The patient is not nervous/anxious.     Objective: Telemetry: NSR, 70s Physical Exam: Blood pressure 127/56, pulse 80, temperature 98.6 F (37 C), temperature source Oral, resp. rate 18, height 6\' 3"  (1.905 m), weight 210 lb 1.6 oz (95.3 kg), SpO2 98 %. Body mass index is 26.26 kg/(m^2). General: Well developed, well nourished, in no acute distress. Head: Normocephalic, atraumatic, sclera non-icteric, no xanthomas, nares are without discharge. Neck: Negative for carotid bruits. JVP not elevated. Lungs: Clear bilaterally to auscultation without wheezes, rales, or rhonchi. Breathing is unlabored. Heart: RRR S1 S2 without murmurs, rubs, or gallops.  Abdomen: Soft, non-tender, non-distended with normoactive bowel sounds. No rebound/guarding. Extremities: No clubbing or cyanosis. No edema. Left foot bandaged.    Neuro: Alert and oriented X 3. Moves all extremities spontaneously. Psych:  Responds to questions appropriately with a normal affect.   Intake/Output Summary (Last 24 hours) at 05/23/15 0816 Last data filed at 05/22/15 2118  Gross per 24 hour  Intake    113 ml  Output      0 ml  Net    113 ml    Inpatient Medications:  . amLODipine  10 mg Oral Daily  . aspirin  81 mg Oral Daily  . atorvastatin  80 mg Oral q1800  . carvedilol  50 mg Oral BID  . ciprofloxacin  500 mg Oral BID  . cloNIDine  0.2 mg Oral Daily  . DAPTOmycin (CUBICIN)  IV  500 mg Intravenous Q24H  . furosemide  40 mg Oral Daily  . hydrALAZINE  100 mg Oral TID  . HYDROcodone-acetaminophen  1 tablet Oral BID  . insulin aspart  0-5 Units Subcutaneous QHS  . insulin aspart  0-9 Units Subcutaneous TID WC  . insulin glargine  16 Units Subcutaneous Q2200  . pantoprazole  40 mg Oral Daily  . sodium chloride  10-40 mL Intracatheter Q12H  . topiramate  25 mg Oral BID   Infusions:    Labs:  Recent Labs  05/21/15 0430 05/22/15 0436 05/23/15 0427  NA 141  --  136  K 3.9  --  4.6  CL 109  --  109  CO2 23  --  20*  GLUCOSE 108*  --  125*  BUN 49*  --  63*  CREATININE 2.78* 2.97* 3.58*  CALCIUM 8.6*  --  8.1*   No results for input(s): AST, ALT, ALKPHOS, BILITOT, PROT, ALBUMIN in the last 72 hours. No results for input(s): WBC, NEUTROABS, HGB, HCT, MCV, PLT in the last 72 hours.  Recent Labs  05/21/15 1558 05/22/15 0436 05/22/15 1034  CKTOTAL  --  20*  --   TROPONINI 0.05* 0.05* 0.04*   Invalid input(s): POCBNP No results for input(s): HGBA1C in the last 72 hours.   Weights: Filed Weights   05/18/15 1205 05/18/15 2141 05/23/15 0417  Weight: 199 lb (90.266 kg) 195 lb 8 oz (88.678 kg) 210 lb 1.6 oz (95.3 kg)     Radiology/Studies:  Mr Foot Left Wo Contrast  05/18/2015   CLINICAL DATA:  Injury on June 30th stepping on a nail. MRSA. Possible osteomyelitis. Deep vein thrombosis.  EXAM: MRI OF THE LEFT  FOREFOOT WITHOUT CONTRAST  TECHNIQUE: Multiplanar, multisequence MR imaging was performed. No intravenous contrast was administered.  COMPARISON:  04/05/2015  FINDINGS: As abnormal osseous edema primarily in the proximal phalanx of the third toe but also to a lesser extent in the heads of the second, third, and fourth metatarsals. There is spurring at the Lisfranc joint without Lisfranc joint malalignment, and the Lisfranc ligament appears intact.  Dorsal subcutaneous edema along the foot noted with edema tracking within along the plantar musculature of the foot. Focally confluent edema projects along the web space between the proximal phalanges of the third and fourth toes, in the vicinity of what appears to potentially be a puncture wound or draining sinus site on image 15 series 5 along the plantar ball of the foot.  Trace effusion of the middle subtalar facet.  IMPRESSION: 1. 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 Possible osteomyelitis involving the heads of the second, third, and fourth metatarsals. 2. Phlegmon and potentially incipient abscess tracking in the web space between the third and fourth proximal phalanges, with small locules of gas in the soft tissues in this vicinity, and extensive soft tissue edema. Possible draining sinus tract, correlate with site of puncture. 3. Dorsal subcutaneous edema and edema infiltrating the plantar musculature of the foot, likely representing cellulitis and myositis, respectively.   Electronically Signed   By: Gaylyn Rong M.D.   On: 05/18/2015 19:07   US Venous Img Lower Unilateral Left  05/13/2015   CLINICAL DATA:  44 year old male with a history of left lower extremity pain  EXAM: LEFT LOWER EXTREMITY VENOUS DOPPLER ULTRASOUND  TECHNIQUE: Gray-scale sonography with graded compression, as well as color Doppler and duplex ultrasound were performed to evaluate the lower extremity deep venous systems from the level of the common femoral vein and  including the common femoral, femoral, profunda femoral, popliteal and calf veins including the posterior tibial, peroneal and gastrocnemius veins when visible. The superficial great saphenous vein was also interrogated. Spectral Doppler was utilized to evaluate flow at rest and with distal augmentation maneuvers in the common femoral, femoral and popliteal veins.  COMPARISON:  None.  FINDINGS: Contralateral Common Femoral Vein: Respiratory phasicity is normal and symmetric with the symptomatic side. No evidence of thrombus. Normal compressibility.  Common Femoral Vein: No evidence of thrombus. Normal compressibility, respiratory phasicity and response to augmentation.  Saphenofemoral Junction: No evidence of thrombus. Normal compressibility and flow on color Doppler imaging.  Profunda Femoral Vein: No evidence of thrombus. Normal compressibility and flow on color Doppler imaging.  Femoral Vein: No evidence of thrombus. Normal compressibility, respiratory phasicity and response to augmentation.  Popliteal Vein: No evidence of thrombus. Normal compressibility, respiratory phasicity and response to augmentation.  Calf Veins: Posterior tibial vein patent, with no thrombus, retained flow, incomplete compressibility.  The left peroneal vein demonstrates occlusive thrombus.  Superficial Great Saphenous Vein: No evidence  of thrombus. Normal compressibility and flow on color Doppler imaging.  Venous Reflux:  None.  Other Findings: Superficial vein at the knee/upper calf thrombosed. It is uncertain if this communicates directly with the great saphenous vein or small saphenous vein.  IMPRESSION: Sonographic survey of the left lower extremity is positive for DVT, with occlusive thrombus in the left peroneal vein, which does not extend into the popliteal vein.  Sonographic survey also positive for superficial thrombophlebitis, with thrombosed vein at the level of the knee/upper calf.  Signed,  Yvone Neu. Loreta Ave, DO  [facility]  [facility]   Electronically Signed   By: Gilmer Mor D.O.   On: 05/13/2015 17:18   Dg Chest Port 1 View  05/22/2015   CLINICAL DATA:  Central line placement.  Confirm line position.  EXAM: PORTABLE CHEST - 1 VIEW  COMPARISON:  07/26/2014.  FINDINGS: Cardiopericardial silhouette is enlarged. Opacity at the LEFT costophrenic angle may represent airspace disease or atelectasis.  There is no pneumothorax. RIGHT IJ central line is present. The tip of the line appears at the cavoatrial junction although this is difficult to visualize because of under penetration on this portable chest radiograph.  IMPRESSION: 1. Uncomplicated RIGHT IJ central line placement with the tip at the junction of the superior vena cava and RIGHT atrium. 2. Cardiomegaly without failure. 3. Opacity at the LEFT costophrenic angle may represent airspace disease or atelectasis.   Electronically Signed   By: Andreas Newport M.D.   On: 05/22/2015 14:37     Assessment and Plan   1. Abnormal ECG: Pt was noted to develop marked anterolateral ST depression and TWI while in OR on 8/19 for I & D of left foot. 12 lead ECG confirms these findings which are concerning for ischemia, although patient is completely asymptomatic w/o c/p or dyspnea. He does have a 20 yr h/o DM, HTN, and CKD III and thus is at risk for CAD and subendocardial ischemia. Cycled troponins have been flat trending at 0.05 x 2-->0.4. Echo is pending to assess EF and wall motion. Continue asa and statin. Cont bb. Troponin with flat/down trend, will hold heparin gtt for ACS. Given flat, down trend in CE, plan for [facility] on Monday, 8/22 to evaluate for high risk ischemia.  Less ideal situation to plan for cardiac cath given his CKD stage III in the setting of acute renal injury.   2. L foot osteomyelitis: S/p I & D. Abx per ID/IM.  3. Essential HTN: stable on multiple meds.  4. Acute on CKD III: Creat has  been variable. Up this AM. Follow. Per IM.  Likely 2/2 hypotension.  Will place hold parameters on antihypertensives.     5. LLE DVT: DVT was dx by U/S on 8/11. Was on coumadin for a day or so prior to admission. INR was therapeutic on admission (2.08 on 8/17). He has not been on any form of anticoagulation since admission. Please add either therapeutic dosing heparin or lovenox for treatment of LLE DVT ASAP - if feasible from surgical standpoint. He does not show any signs/symptoms of PE at this time. If RV down on echo, would check V:Q scan.  6. Type II DM: Insulin mgmt per IM.  7. H/o Hematemesis/duodenitis/gastritis/esophagitis: Noted on EGD in July w/o source of bleeding. Cont PPI. Will have to follow H/H closely in setting of need for oral anticoagulation and baseline anemia. If he develops recurrent hematemesis, would need IVC filter.  Signed, Alycia Rossetti  Shea Evans, PA-C Pager: [phone] 05/23/2015, 8:16 AM  Patient seen, examined. Available data reviewed. Agree with findings, assessment, and plan as outlined by Eula Listen, PA-C. The patient remains free of any cardiovascular symptoms. I have personally reviewed his echo images and he does have mild global LV systolic dysfunction. 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 He only takes clonidine at home and reports suboptimal blood pressure control over a long period of time. Plans noted for a pharmacologic nuclear stress test tomorrow for further risk stratification. Would have a high threshold for cardiac catheterization considering his absence of symptoms and presence of chronic kidney disease. I agree with backing down on his antihypertensives regimen in the setting of acute on chronic kidney injury, now with creatinine of 3.6 this morning. He may have had relative hypoperfusion of his kidneys.  Tonny Bollman, M.D. 05/23/2015 11:12 AM

## 2015-05-23 NOTE — Progress Notes (Signed)
Spoke with Eula Listen PA asking for parameters for BP meds - said he would place them after he rounds on patient.

## 2015-05-23 NOTE — Progress Notes (Signed)
[facility] - [place] at [facility]   PATIENT NAME: Juan Roberson    MR#:  [id number]  DATE OF BIRTH:  [birth year]  SUBJECTIVE:  CHIEF COMPLAINT:   Chief Complaint  Patient presents with  . Foot Pain   Patient here with left foot pain and swelling and MRI findings suggestive of osteomyelitis on the left third toe. Also noted to have an abscess between the 3rd & 4th toe web space. S/p incision and drainage of left forefoot abscess and debridement of infected bone of proximal phalanx of left third toe 19th of August 2016 by Dr. Alberteen Spindle. Patient denies any significant pains and feels comfortable. Foot was redressed today by Dr. Alberteen Spindle on the minimal blood was needed noted on the dressing. Minimal purulence.  Echocardiogram is being planned and PICC line will be placed later today for daptomycin. Cardiology, however recommends Lexiscan stress test on 05/24/2015 . Blood cultures from 05/18/2015 and negative. Wound cultures 19th of August 2016 no growth, on daptomycin and ciprofloxacin at present. PICC line was placed 20th of August 2016.  REVIEW OF SYSTEMS:    Review of Systems  Constitutional: Negative for fever and chills.  HENT: Negative for congestion and tinnitus.   Eyes: Negative for blurred vision and double vision.  Respiratory: Negative for cough, shortness of breath and wheezing.   Cardiovascular: Negative for chest pain, orthopnea and PND.  Gastrointestinal: Negative for nausea, vomiting, abdominal pain and diarrhea.  Genitourinary: Negative for dysuria and hematuria.  Musculoskeletal: Positive for joint pain (left foot pain).  Neurological: Negative for dizziness, sensory change and focal weakness.  All other systems reviewed and are negative.   Nutrition: Diabetic Tolerating Diet: Yes Tolerating PT: Ambulatory  DRUG ALLERGIES:   Allergies  Allergen Reactions  . Sulfur Other (See Comments)    Renal failure    VITALS:  Blood pressure 138/59, pulse  74, temperature 98.3 F (36.8 C), temperature source Oral, resp. rate 18, height  (1.905 m), weight 95.3 kg (210 lb 1.6 oz), SpO2 97 %.  PHYSICAL EXAMINATION:   Physical Exam  GENERAL:  44 y.o.-year-old patient lying in the bed with no acute distress.  EYES: Pupils equal, round, reactive to light and accommodation. No scleral icterus. Extraocular muscles intact.  HEENT: Head atraumatic, normocephalic. Oropharynx and nasopharynx clear.  NECK:  Supple, no jugular venous distention. No thyroid enlargement, no tenderness.  LUNGS: Normal breath sounds bilaterally, no wheezing, rales, rhonchi. No use of accessory muscles of respiration.  CARDIOVASCULAR: S1, S2 normal. No murmurs, rubs, or gallops.  ABDOMEN: Soft, nontender, nondistended. Bowel sounds present. No organomegaly or mass.  EXTREMITIES: No cyanosis, clubbing or edema b/l.  Left plantar foot swelling, redness, open sore with minimal drainage.     NEUROLOGIC: Cranial nerves II through XII are intact. No focal Motor or sensory deficits b/l.   PSYCHIATRIC: The patient is alert and oriented x 3. Good affect SKIN: No obvious rash, lesion, or left foot plantar ulcer. Left foot reveals the incisions anterior aspect of the foot with minimal purulence. No significant bleeding. No significant swelling, no tenderness on palpation. Redressed with Dr. Alberteen Spindle   LABORATORY PANEL:   CBC  Recent Labs Lab 05/19/15 0514  WBC 6.7  HGB 8.7*  HCT 28.2*  PLT 175   ------------------------------------------------------------------------------------------------------------------  Chemistries   Recent Labs Lab 05/23/15 0427  NA 136  K 4.6  CL 109  CO2 20*  GLUCOSE 125*  BUN 63*  CREATININE 3.58*  CALCIUM 8.1*   ------------------------------------------------------------------------------------------------------------------  Cardiac Enzymes  Recent Labs Lab 05/22/15 1034  TROPONINI 0.04*    ------------------------------------------------------------------------------------------------------------------  RADIOLOGY:  Dg Chest Port 1 View  05/22/2015   CLINICAL DATA:  Central line placement.  Confirm line position.  EXAM: PORTABLE CHEST - 1 VIEW  COMPARISON:  07/26/2014.  FINDINGS: Cardiopericardial silhouette is enlarged. Opacity at the LEFT costophrenic angle may represent airspace disease or atelectasis.  There is no pneumothorax. RIGHT IJ central line is present. The tip of the line appears at the cavoatrial junction although this is difficult to visualize because of under penetration on this portable chest radiograph.  IMPRESSION: 1. Uncomplicated RIGHT IJ central line placement with the tip at the junction of the superior vena cava and RIGHT atrium. 2. Cardiomegaly without failure. 3. Opacity at the LEFT costophrenic angle may represent airspace disease or atelectasis.   Electronically Signed   By: Andreas Newport M.D.   On: 05/22/2015 14:37     ASSESSMENT AND PLAN:   44 year old male with history of diabetes, hypertension, chronic kidney disease stage III, GERD, history of left foot cellulitis who presents to the hospital but the left foot swelling and redness and pain.  #1 left foot abscess as well as a osteomyelitis of the third toe proximal phalanx, status post incision and debridement and drainage of abscess , PICC line will be placed 20th of August 2016 and patient to be continued on daptomycin IV and ciprofloxacin orally for  4 weeks per Dr. Jarrett Ables recommendations . Patient will need 4 weeks therapy was likely oral antibiotics afterwards.     #2 chronic kidney disease stage III-patient's creatinine is close to baseline. We'll continue to monitor renal function.  #3 hypertension, patient was relatively hypotensive yesterday. However, patient's blood pressure rebounds today-continue clonidine, Coreg, amlodipine. Blood pressure is overall well controlled  #4  diabetic neuropathy-continue Neurontin, Lyrica.   #5 diabetes with renal complication-continue Lantus, sliding scale insulin.Fasting blood glucose level is 119  today  #6 GERD-continue Protonix.  # 7. Elevated troponin. Appreciate cardiology involvement. Patient is to continue aspirin, Lipitor, Coreg, he had echocardiogram which revealed normal ejection fraction but moderate MR and TR. He will undergo stress test on Monday 22 nd of August 2016. Appreciate cardiologist's input  #8  Recent diagnosis of left lower extremity DVT, discussed with Dr. Evie Lacks, no Coumadin is recommended , other than full aspirin therapy, discussed is Dr. Alberteen Spindle   Likely d/c home tomorrow.  Discussed is Dr. Alberteen Spindle as well as patient's family about his treatment plan and discharge planning  All the records are reviewed and case discussed with Care Management/Social Workerr. Management plans discussed with the patient, family and they are in agreement.  CODE STATUS: Full code  DVT Prophylaxis: Ambulatory  TOTAL TIME TAKING CARE OF THIS PATIENT: 40 minutes.  Discussed with cardiology, dr Alberteen Spindle, additional time spent about 10 more minutes POSSIBLE D/C  tomorrow.   Katharina Caper M.D on 05/23/2015 at 9:55 AM  Between 7am to 6pm - Pager - [phone]  After 6pm go to [email or web address] - password [email or web address]  [place] [facility]  Office  [phone]  CC: Primary care physician; Corky Downs, MD

## 2015-05-23 NOTE — Progress Notes (Signed)
Glucometer not synching with CHL - CBG 191

## 2015-05-23 NOTE — Progress Notes (Signed)
Glucometer not syncing with CHL. CBG 160.

## 2015-05-23 NOTE — Progress Notes (Signed)
2 Days Post-Op  Subjective: Patient seen. Denies any complaints with his left foot. Feeling well.  Objective: Vital signs in last 24 hours: Temp:  [98 F (36.7 C)-101 F (38.3 C)] 98.6 F (37 C) (08/21 0417) Pulse Rate:  [72-85] 80 (08/21 0417) Resp:  [18] 18 (08/21 0417) BP: (105-127)/(43-59) 127/56 mmHg (08/21 0417) SpO2:  [95 %-100 %] 98 % (08/21 0417) Weight:  [95.3 kg (210 lb 1.6 oz)] 95.3 kg (210 lb 1.6 oz) (08/21 0417) Last BM Date: 05/21/15  Intake/Output from previous day: 08/20 0701 - 08/21 0700 In: 113 [I.V.:3; IV Piggyback:110] Out: -  Intake/Output this shift:    Only mild to moderate bleeding on the bandage. Upon removal of the packing there is mild purulence. Minimal erythema or edema. Incisions are well coapted.  Lab Results:  No results for input(s): WBC, HGB, HCT, PLT in the last 72 hours. BMET  Recent Labs  05/21/15 0430 05/22/15 0436 05/23/15 0427  NA 141  --  136  K 3.9  --  4.6  CL 109  --  109  CO2 23  --  20*  GLUCOSE 108*  --  125*  BUN 49*  --  63*  CREATININE 2.78* 2.97* 3.58*  CALCIUM 8.6*  --  8.1*   PT/INR No results for input(s): LABPROT, INR in the last 72 hours. ABG No results for input(s): PHART, HCO3 in the last 72 hours.  Invalid input(s): PCO2, PO2  Studies/Results: Dg Chest Port 1 View  05/22/2015   CLINICAL DATA:  Central line placement.  Confirm line position.  EXAM: PORTABLE CHEST - 1 VIEW  COMPARISON:  07/26/2014.  FINDINGS: Cardiopericardial silhouette is enlarged. Opacity at the LEFT costophrenic angle may represent airspace disease or atelectasis.  There is no pneumothorax. RIGHT IJ central line is present. The tip of the line appears at the cavoatrial junction although this is difficult to visualize because of under penetration on this portable chest radiograph.  IMPRESSION: 1. Uncomplicated RIGHT IJ central line placement with the tip at the junction of the superior vena cava and RIGHT atrium. 2. Cardiomegaly without  failure. 3. Opacity at the LEFT costophrenic angle may represent airspace disease or atelectasis.   Electronically Signed   By: Andreas Newport M.D.   On: 05/22/2015 14:37    Anti-infectives: Anti-infectives    Start     Dose/Rate Route Frequency Ordered Stop   05/22/15 2000  ciprofloxacin (CIPRO) tablet 500 mg     500 mg Oral 2 times daily 05/22/15 1143     05/22/15 1800  DAPTOmycin (CUBICIN) 500 mg in sodium chloride 0.9 % IVPB     500 mg 220 mL/hr over 30 Minutes Intravenous Every 24 hours 05/22/15 1131     05/21/15 1900  vancomycin (VANCOCIN) 1,500 mg in sodium chloride 0.9 % 500 mL IVPB  Status:  Discontinued     1,500 mg 250 mL/hr over 120 Minutes Intravenous Every 24 hours 05/21/15 0846 05/21/15 1603   05/21/15 1700  DAPTOmycin (CUBICIN) 530 mg in sodium chloride 0.9 % IVPB  Status:  Discontinued     530 mg 221.2 mL/hr over 30 Minutes Intravenous Every 24 hours 05/21/15 1613 05/21/15 1617   05/21/15 1700  DAPTOmycin (CUBICIN) 500 mg in sodium chloride 0.9 % IVPB  Status:  Discontinued     500 mg 220 mL/hr over 30 Minutes Intravenous Every 24 hours 05/21/15 1617 05/22/15 1131   05/21/15 1400  ciprofloxacin (CIPRO) tablet 500 mg  Status:  Discontinued  500 mg Oral Every 18 hours 05/21/15 0855 05/22/15 1143   05/20/15 2000  ciprofloxacin (CIPRO) tablet 500 mg  Status:  Discontinued     500 mg Oral 2 times daily 05/20/15 0933 05/21/15 0855   05/20/15 1600  vancomycin (VANCOCIN) 1,500 mg in sodium chloride 0.9 % 500 mL IVPB  Status:  Discontinued     1,500 mg 250 mL/hr over 120 Minutes Intravenous Every 24 hours 05/20/15 0533 05/21/15 0846   05/19/15 0800  ciprofloxacin (CIPRO) IVPB 400 mg  Status:  Discontinued     400 mg 200 mL/hr over 60 Minutes Intravenous Every 12 hours 05/18/15 2117 05/20/15 0933   05/19/15 0500  vancomycin (VANCOCIN) IVPB 1000 mg/200 mL premix  Status:  Discontinued     1,000 mg 200 mL/hr over 60 Minutes Intravenous Every 12 hours 05/18/15 2153 05/20/15  0527   05/18/15 2200  vancomycin (VANCOCIN) IVPB 1000 mg/200 mL premix     1,000 mg 200 mL/hr over 60 Minutes Intravenous  Once 05/18/15 2152 05/18/15 2351   05/18/15 2000  ciprofloxacin (CIPRO) IVPB 400 mg     400 mg 200 mL/hr over 60 Minutes Intravenous  Once 05/18/15 1959 05/18/15 2056   05/18/15 1945  vancomycin (VANCOCIN) IVPB 1000 mg/200 mL premix  Status:  Discontinued     1,000 mg 200 mL/hr over 60 Minutes Intravenous  Once 05/18/15 1937 05/18/15 2152   05/18/15 1945  piperacillin-tazobactam (ZOSYN) IVPB 3.375 g  Status:  Discontinued     3.375 g 12.5 mL/hr over 240 Minutes Intravenous  Once 05/18/15 1937 05/18/15 1959      Assessment/Plan: s/p Procedure(s): IRRIGATION AND DEBRIDEMENT FOOT (Left) Plan: Wound on the left foot was repacked with sterile saline gauze. Continue antibiotics. Had PICC line placed yesterday. Awaiting culture results. Follow daily until discharge.  LOS: 5 days    Armstrong Creasy W. 05/23/2015

## 2015-05-24 ENCOUNTER — Encounter: Payer: BLUE CROSS/BLUE SHIELD | Attending: Physician Assistant

## 2015-05-24 ENCOUNTER — Encounter: Payer: Self-pay | Admitting: Podiatry

## 2015-05-24 DIAGNOSIS — R931 Abnormal findings on diagnostic imaging of heart and coronary circulation: Secondary | ICD-10-CM | POA: Insufficient documentation

## 2015-05-24 DIAGNOSIS — I129 Hypertensive chronic kidney disease with stage 1 through stage 4 chronic kidney disease, or unspecified chronic kidney disease: Secondary | ICD-10-CM | POA: Insufficient documentation

## 2015-05-24 DIAGNOSIS — I5189 Other ill-defined heart diseases: Secondary | ICD-10-CM | POA: Insufficient documentation

## 2015-05-24 DIAGNOSIS — L02612 Cutaneous abscess of left foot: Secondary | ICD-10-CM | POA: Insufficient documentation

## 2015-05-24 DIAGNOSIS — K920 Hematemesis: Secondary | ICD-10-CM | POA: Insufficient documentation

## 2015-05-24 DIAGNOSIS — N183 Chronic kidney disease, stage 3 (moderate): Secondary | ICD-10-CM | POA: Insufficient documentation

## 2015-05-24 DIAGNOSIS — R110 Nausea: Secondary | ICD-10-CM | POA: Insufficient documentation

## 2015-05-24 DIAGNOSIS — L0390 Cellulitis, unspecified: Secondary | ICD-10-CM | POA: Insufficient documentation

## 2015-05-24 DIAGNOSIS — R9431 Abnormal electrocardiogram [ECG] [EKG]: Secondary | ICD-10-CM

## 2015-05-24 DIAGNOSIS — M86179 Other acute osteomyelitis, unspecified ankle and foot: Secondary | ICD-10-CM | POA: Insufficient documentation

## 2015-05-24 DIAGNOSIS — K2970 Gastritis, unspecified, without bleeding: Secondary | ICD-10-CM | POA: Insufficient documentation

## 2015-05-24 LAB — WOUND CULTURE: CULTURE: NO GROWTH

## 2015-05-24 LAB — BASIC METABOLIC PANEL
ANION GAP: 7 (ref 5–15)
BUN: 69 mg/dL — ABNORMAL HIGH (ref 6–20)
CHLORIDE: 107 mmol/L (ref 101–111)
CO2: 21 mmol/L — ABNORMAL LOW (ref 22–32)
Calcium: 8.3 mg/dL — ABNORMAL LOW (ref 8.9–10.3)
Creatinine, Ser: 3.4 mg/dL — ABNORMAL HIGH (ref 0.61–1.24)
GFR calc Af Amer: 23 mL/min — ABNORMAL LOW (ref >60)
GFR, EST NON AFRICAN AMERICAN: 20 mL/min — AB (ref >60)
GLUCOSE: 95 mg/dL (ref 65–99)
POTASSIUM: 4.1 mmol/L (ref 3.5–5.1)
Sodium: 135 mmol/L (ref 135–145)

## 2015-05-24 LAB — GLUCOSE, CAPILLARY
GLUCOSE-CAPILLARY: 81 mg/dL (ref 65–99)
GLUCOSE-CAPILLARY: 164 mg/dL — AB (ref 65–99)
Glucose-Capillary: 84 mg/dL (ref 65–99)
Glucose-Capillary: 114 mg/dL — ABNORMAL HIGH (ref 65–99)
Glucose-Capillary: 193 mg/dL — ABNORMAL HIGH (ref 65–99)

## 2015-05-24 MED ORDER — CIPROFLOXACIN HCL 500 MG PO TABS: 500.0000 mg | ORAL_TABLET | Freq: Two times a day (BID) | ORAL | Status: DC

## 2015-05-24 MED ORDER — TECHNETIUM TC 99M SESTAMIBI - CARDIOLITE
33.6000 | Freq: Once | INTRAVENOUS | Status: AC | PRN
  Administered 2015-05-24: 12:00:00 33.6 via INTRAVENOUS

## 2015-05-24 MED ORDER — ALBUTEROL SULFATE (2.5 MG/3ML) 0.083% IN NEBU: 2.5000 mg | INHALATION_SOLUTION | RESPIRATORY_TRACT | Status: DC | PRN

## 2015-05-24 MED ORDER — ASPIRIN 325 MG PO TBEC: 325.0000 mg | DELAYED_RELEASE_TABLET | Freq: Every day | ORAL | Status: DC

## 2015-05-24 MED ORDER — SODIUM CHLORIDE 0.9 % IV SOLN: 500.0000 mg | INTRAVENOUS | Status: DC

## 2015-05-24 MED ORDER — SENNOSIDES-DOCUSATE SODIUM 8.6-50 MG PO TABS
2.0000 | ORAL_TABLET | Freq: Two times a day (BID) | ORAL | Status: DC
  Administered 2015-05-25: 2 via ORAL
  Filled 2015-05-24: qty 2

## 2015-05-24 MED ORDER — TECHNETIUM TC 99M SESTAMIBI - CARDIOLITE
14.0470 | Freq: Once | INTRAVENOUS | Status: AC | PRN
  Administered 2015-05-24: 14.047 via INTRAVENOUS

## 2015-05-24 MED ORDER — INSULIN ASPART 100 UNIT/ML ~~LOC~~ SOLN: 0.0000 [IU] | Freq: Three times a day (TID) | SUBCUTANEOUS | Status: DC

## 2015-05-24 MED ORDER — ATORVASTATIN CALCIUM 80 MG PO TABS: 80.0000 mg | ORAL_TABLET | Freq: Every day | ORAL | Status: DC

## 2015-05-24 MED ORDER — REGADENOSON 0.4 MG/5ML IV SOLN
0.4000 mg | Freq: Once | INTRAVENOUS | Status: DC
  Filled 2015-05-24: qty 5

## 2015-05-24 MED ORDER — REGADENOSON 0.4 MG/5ML IV SOLN
0.4000 mg | Freq: Once | INTRAVENOUS | Status: AC
  Administered 2015-05-24: 0.4 mg via INTRAVENOUS
  Filled 2015-05-24: qty 5

## 2015-05-24 MED ORDER — SODIUM CHLORIDE 0.9 % IV SOLN
500.0000 mg | INTRAVENOUS | Status: DC
  Administered 2015-05-24 – 2015-05-25 (×2): 500 mg via INTRAVENOUS
  Filled 2015-05-24 (×2): qty 10

## 2015-05-24 MED ORDER — INSULIN GLARGINE 100 UNIT/ML ~~LOC~~ SOLN: 16.0000 [IU] | Freq: Every day | SUBCUTANEOUS | Status: DC

## 2015-05-24 MED ORDER — OXYCODONE HCL 5 MG PO TABS: 5.0000 mg | ORAL_TABLET | ORAL | Status: DC | PRN

## 2015-05-24 MED ORDER — SENNOSIDES-DOCUSATE SODIUM 8.6-50 MG PO TABS: 2.0000 | ORAL_TABLET | Freq: Two times a day (BID) | ORAL | Status: DC

## 2015-05-24 NOTE — Progress Notes (Signed)
ANTIBIOTIC CONSULT NOTE - INITIAL  Pharmacy Consult for Daptomycin Indication: Osteomyelitis  Allergies  Allergen Reactions  . Sulfur Other (See Comments)    Renal failure    Patient Measurements: Height:  (190.5 cm) Weight: 210 lb 1.6 oz (95.3 kg) IBW/kg (Calculated) : 84.5   Vital Signs: Temp: 98.2 F (36.8 C) (08/22 1319) Temp Source: Oral (08/22 1319) BP: 156/67 mmHg (08/22 1319) Pulse Rate: 85 (08/22 1319) Intake/Output from previous day: 08/21 0701 - 08/22 0700 In: 110 [IV Piggyback:110] Out: 0  Intake/Output from this shift: Total I/O In: 10 [I.V.:10] Out: 0   Labs:  Recent Labs  05/22/15 0436 05/23/15 0427 05/24/15 0425  CREATININE 2.97* 3.58* 3.40*   Estimated Creatinine Clearance: 32.4 mL/min (by C-G formula based on Cr of 3.4). No results for input(s): VANCOTROUGH, VANCOPEAK, VANCORANDOM, GENTTROUGH, GENTPEAK, GENTRANDOM, TOBRATROUGH, TOBRAPEAK, TOBRARND, AMIKACINPEAK, AMIKACINTROU, AMIKACIN in the last 72 hours.   Microbiology: Recent Results (from the past 720 hour(s))  Wound culture     Status: None   Collection Time: 05/13/15  2:50 AM  Result Value Ref Range Status   Specimen Description FOOT  Final   Special Requests NONE  Final   Gram Stain   Final    FEW WBC SEEN FEW GRAM POSITIVE COCCI RARE GRAM NEGATIVE RODS    Culture   Final    MODERATE GROWTH METHICILLIN RESISTANT STAPHYLOCOCCUS AUREUS MODERATE GROWTH GROUP B STREP(S.AGALACTIAE)ISOLATED CRITICAL RESULT CALLED TO, READ BACK BY AND VERIFIED WITH: LAURIE WOODS AR 1515 05/17/15 CTJ Virtually 100% of S. agalactiae (Group B) strains are susceptible to Penicillin.  For Penicillin-allergic patients, Erythromycin (85-95% sensitive) and Clindamycin (80% sensitive) are drugs of choice. Contact microbiology lab to request sensitivities if  needed within 7 days.    Report Status 05/18/2015 FINAL  Final   Organism ID, Bacteria METHICILLIN RESISTANT STAPHYLOCOCCUS AUREUS  Final   Susceptibility   Methicillin resistant staphylococcus aureus - MIC*    CIPROFLOXACIN >=8 RESISTANT Resistant     GENTAMICIN <=0.5 SENSITIVE Sensitive     OXACILLIN Value in next row Resistant      >=4 MODERATELY RESISTANT    VANCOMYCIN <=0.5 SENSITIVE Sensitive     TRIMETH/SULFA <=10 SENSITIVE Sensitive     CEFOXITIN SCREEN Value in next row Resistant      POSITIVECEFOXITIN SCREEN - This test may be used to predict mecA-mediated oxacillin resistance, and it is based on the cefoxitin disk screen test.  The cefoxitin screen and oxacillin work in combination to determine the final interpretation reported for oxacillin.     Inducible Clindamycin Value in next row Sensitive      POSITIVECEFOXITIN SCREEN - This test may be used to predict mecA-mediated oxacillin resistance, and it is based on the cefoxitin disk screen test.  The cefoxitin screen and oxacillin work in combination to determine the final interpretation reported for oxacillin.     ERYTHROMYCIN Value in next row Resistant      RESISTANT>=8    TETRACYCLINE Value in next row Sensitive      SENSITIVE<=1    CLINDAMYCIN Value in next row Sensitive      SENSITIVE<=0.25    LINEZOLID Value in next row Sensitive      SENSITIVE2    * MODERATE GROWTH METHICILLIN RESISTANT STAPHYLOCOCCUS AUREUS  Anaerobic culture     Status: None   Collection Time: 05/13/15  2:50 AM  Result Value Ref Range Status   Specimen Description FOOT  Final   Special Requests NONE  Final  Culture NO ANAEROBES ISOLATED  Final   Report Status 05/20/2015 FINAL  Final  Blood culture (routine x 2)     Status: None   Collection Time: 05/18/15  8:01 PM  Result Value Ref Range Status   Specimen Description BLOOD RIGHT ARM  Final   Special Requests BOTTLES DRAWN AEROBIC AND ANAEROBIC 2CC  Final   Culture NO GROWTH 5 DAYS  Final   Report Status 05/23/2015 FINAL  Final  Blood culture (routine x 2)     Status: None   Collection Time: 05/18/15  8:01 PM  Result Value Ref  Range Status   Specimen Description BLOOD LEFT HAND  Final   Special Requests BOTTLES DRAWN AEROBIC AND ANAEROBIC 3CC  Final   Culture NO GROWTH 5 DAYS  Final   Report Status 05/23/2015 FINAL  Final  Anaerobic culture     Status: None (Preliminary result)   Collection Time: 05/21/15  2:07 PM  Result Value Ref Range Status   Specimen Description WOUND  Final   Special Requests NONE  Final   Culture NO GROWTH 2 DAYS  Final   Report Status PENDING  Incomplete  Wound culture     Status: None   Collection Time: 05/21/15  2:07 PM  Result Value Ref Range Status   Specimen Description WOUND  Final   Special Requests NONE  Final   Gram Stain RARE WBC SEEN RARE GRAM NEGATIVE COCCI   Final   Culture NO GROWTH 3 DAYS  Final   Report Status 05/24/2015 FINAL  Final    Medical History: Past Medical History  Diagnosis Date  . Diabetes mellitus without complication     a. Dx ~ 1996.  . Essential hypertension   . CKD (chronic kidney disease), stage III   . GERD (gastroesophageal reflux disease)   . Cellulitis and abscess of foot     Left-Dr. Ether Griffins  . Osteomyelitis     a. 05/2015 Roberson foot.  . Left leg DVT     a. Dx 05/2015 -> Coumadin.  . Gastritis     a. 04/2015 hematemesis -> EGD: gastritis, esophagitis, duodenitis.  No active bleeding.  PPI added.    Medications:  Scheduled:  . amLODipine  10 mg Oral Daily  . aspirin EC  325 mg Oral Daily  . atorvastatin  80 mg Oral q1800  . carvedilol  25 mg Oral BID  . ciprofloxacin  500 mg Oral BID  . cloNIDine  0.2 mg Oral Daily  . DAPTOmycin (CUBICIN)  IV  500 mg Intravenous Q24H  . hydrALAZINE  50 mg Oral TID  . HYDROcodone-acetaminophen  1 tablet Oral BID  . insulin aspart  0-5 Units Subcutaneous QHS  . insulin aspart  0-9 Units Subcutaneous TID WC  . insulin glargine  16 Units Subcutaneous Q2200  . pantoprazole  40 mg Oral Daily  . regadenoson  0.4 mg Intravenous Once  . sodium chloride  10-40 mL Intracatheter Q12H  . 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 ID wants to use daptomycin due to patient with CKD and will need 4 weeks of iv abx. Patient is also ordered ciprofloxacin 500mg  PO Q12hr.    Goal of Therapy:  Resolution of infection  Plan:  Continue daptomycin 500 mg iv  q 24 hours. Will monitor renal function closely and will check CPK weekly. Will recheck serum creatinine with am labs on 8/25.    Pharmacy will continue to monitor and adjust per consult.   Juan Roberson,Juan Roberson 05/24/2015,1:40 PM

## 2015-05-24 NOTE — Progress Notes (Signed)
Per patient

## 2015-05-24 NOTE — Progress Notes (Signed)
Patient: Juan Roberson / Admit Date: 05/18/2015 / Date of Encounter: 05/24/2015, 6:19 PM   Subjective: Feels well . No chest pain, SOB, or palpitations. Has ambulated in room with issues.  Review of Systems: Review of Systems  Constitutional: Negative for fever, chills, weight loss, malaise/fatigue and diaphoresis.  Eyes: Negative for discharge and redness.  Respiratory: Negative for cough, hemoptysis, sputum production, shortness of breath and wheezing.   Cardiovascular: Negative for chest pain, palpitations, orthopnea, claudication, leg swelling and PND.  Gastrointestinal: Negative for nausea and vomiting.  Musculoskeletal: Negative for myalgias and falls.  Skin: Negative for rash.  Neurological: Negative for sensory change, speech change, focal weakness and weakness.  Endo/Heme/Allergies: Does not bruise/bleed easily.  Psychiatric/Behavioral: The patient is not nervous/anxious.     Objective: Telemetry: NSR, 70s Physical Exam: Blood pressure 156/67, pulse 85, temperature 98.2 F (36.8 C), temperature source Oral, resp. rate 18, height  (1.905 m), weight 210 lb 1.6 oz (95.3 kg), SpO2 99 %. Body mass index is 26.26 kg/(m^2). General: Well developed, well nourished, in no acute distress. Head: Normocephalic, atraumatic, sclera non-icteric, no xanthomas, nares are without discharge. Neck: Negative for carotid bruits. JVP not elevated. Lungs: Clear bilaterally to auscultation without wheezes, rales, or rhonchi. Breathing is unlabored. Heart: RRR S1 S2 without murmurs, rubs, or gallops.  Abdomen: Soft, non-tender, non-distended with normoactive bowel sounds. No rebound/guarding. Extremities: No clubbing or cyanosis. No edema. Left foot bandaged.  Neuro: Alert and oriented X 3. Moves all extremities spontaneously. Psych:  Responds to questions appropriately with a normal affect.   Intake/Output Summary (Last 24 hours) at 05/24/15 1819 Last data filed at 05/24/15 1600  Gross per 24 hour  Intake     10 ml  Output      0 ml  Net     10 ml    Inpatient Medications:  . amLODipine  10 mg Oral Daily  . aspirin EC  325 mg Oral Daily  . atorvastatin  80 mg Oral q1800  . carvedilol  25 mg Oral BID  . ciprofloxacin  500 mg Oral BID  . cloNIDine  0.2 mg Oral Daily  . DAPTOmycin (CUBICIN)  IV  500 mg Intravenous Q24H  . hydrALAZINE  50 mg Oral TID  . HYDROcodone-acetaminophen  1 tablet Oral BID  . insulin aspart  0-5 Units Subcutaneous QHS  . insulin aspart  0-9 Units Subcutaneous TID WC  . insulin glargine  16 Units Subcutaneous Q2200  . pantoprazole  40 mg Oral Daily  . regadenoson  0.4 mg Intravenous Once  . senna-docusate  2 tablet Oral BID  . sodium chloride  10-40 mL Intracatheter Q12H  . topiramate  25 mg Oral BID   Infusions:    Labs:  Recent Labs  05/23/15 0427 05/24/15 0425  NA 136 135  K 4.6 4.1  CL 109 107  CO2 20* 21*  GLUCOSE 125* 95  BUN 63* 69*  CREATININE 3.58* 3.40*  CALCIUM 8.1* 8.3*   No results for input(s): AST, ALT, ALKPHOS, BILITOT, PROT, ALBUMIN in the last 72 hours. No results for input(s): WBC, NEUTROABS, HGB, HCT, MCV, PLT in the last 72 hours.  Recent Labs  05/22/15 0436 05/22/15 1034  CKTOTAL 20*  --   TROPONINI 0.05* 0.04*   Invalid input(s): POCBNP No results for input(s): HGBA1C in the last 72 hours.   Weights: Filed Weights   05/18/15 1205 05/18/15 2141 05/23/15 0417  Weight: 199 lb (90.266 kg) 195 lb  8 oz (88.678 kg) 210 lb 1.6 oz (95.3 kg)     Radiology/Studies:  Mr Foot Left Wo Contrast  05/18/2015   CLINICAL DATA:  Injury on June 30th stepping on a nail. MRSA. Possible osteomyelitis. Deep vein thrombosis.  EXAM: MRI OF THE LEFT FOREFOOT WITHOUT CONTRAST  TECHNIQUE: Multiplanar, multisequence MR imaging was performed. No intravenous contrast was administered.  COMPARISON:  04/05/2015  FINDINGS: As abnormal osseous edema primarily in the proximal phalanx of the third toe but also to a lesser  extent in the heads of the second, third, and fourth metatarsals. There is spurring at the Lisfranc joint without Lisfranc joint malalignment, and the Lisfranc ligament appears intact.  Dorsal subcutaneous edema along the foot noted with edema tracking within along the plantar musculature of the foot. Focally confluent edema projects along the web space between the proximal phalanges of the third and fourth toes, in the vicinity of what appears to potentially be a puncture wound or draining sinus site on image 15 series 5 along the plantar ball of the foot.  Trace effusion of the middle subtalar facet.  IMPRESSION: 1. 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 Possible osteomyelitis involving the heads of the second, third, and fourth metatarsals. 2. Phlegmon and potentially incipient abscess tracking in the web space between the third and fourth proximal phalanges, with small locules of gas in the soft tissues in this vicinity, and extensive soft tissue edema. Possible draining sinus tract, correlate with site of puncture. 3. Dorsal subcutaneous edema and edema infiltrating the plantar musculature of the foot, likely representing cellulitis and myositis, respectively.   Electronically Signed   By: Gaylyn Rong M.D.   On: 05/18/2015 19:07   Nm Myocar Multi W/spect W/wall Motion / Ef  05/24/2015    Defect 1: There is a medium defect of moderate severity present in the  mid anterior, apical anterior and apex location which is partially  reversible.  Findings consistent with anterior ischemia. However, the study is  suboptimal due to GI uptake.  This is an intermediate risk study.  The left ventricular ejection fraction is moderately decreased (30-44%).  Calculated EF is 39% but visually appears around 50%.    US Venous Img Lower Unilateral Left  05/13/2015   CLINICAL DATA:  44 year old male with a history of left lower extremity pain  EXAM: LEFT LOWER EXTREMITY VENOUS DOPPLER ULTRASOUND   TECHNIQUE: Gray-scale sonography with graded compression, as well as color Doppler and duplex ultrasound were performed to evaluate the lower extremity deep venous systems from the level of the common femoral vein and including the common femoral, femoral, profunda femoral, popliteal and calf veins including the posterior tibial, peroneal and gastrocnemius veins when visible. The superficial great saphenous vein was also interrogated. Spectral Doppler was utilized to evaluate flow at rest and with distal augmentation maneuvers in the common femoral, femoral and popliteal veins.  COMPARISON:  None.  FINDINGS: Contralateral Common Femoral Vein: Respiratory phasicity is normal and symmetric with the symptomatic side. No evidence of thrombus. Normal compressibility.  Common Femoral Vein: No evidence of thrombus. Normal compressibility, respiratory phasicity and response to augmentation.  Saphenofemoral Junction: No evidence of thrombus. Normal compressibility and flow on color Doppler imaging.  Profunda Femoral Vein: No evidence of thrombus. Normal compressibility and flow on color Doppler imaging.  Femoral Vein: No evidence of thrombus. Normal compressibility, respiratory phasicity and response to augmentation.  Popliteal Vein: No evidence of thrombus. Normal compressibility, respiratory phasicity and response to augmentation.  Calf Veins: Posterior tibial vein patent, with no thrombus, retained flow, incomplete compressibility.  The left peroneal vein demonstrates occlusive thrombus.  Superficial Great Saphenous Vein: No evidence of thrombus. Normal compressibility and flow on color Doppler imaging.  Venous Reflux:  None.  Other Findings: Superficial vein at the knee/upper calf thrombosed. It is uncertain if this communicates directly with the great saphenous vein or small saphenous vein.  IMPRESSION: Sonographic survey of the left lower extremity is positive for DVT, with occlusive thrombus in the left peroneal vein,  which does not extend into the popliteal vein.  Sonographic survey also positive for superficial thrombophlebitis, with thrombosed vein at the level of the knee/upper calf.  Signed,  Yvone Neu. Loreta Ave, DO  [facility]  [facility]   Electronically Signed   By: Gilmer Mor D.O.   On: 05/13/2015 17:18   Dg Chest Port 1 View  05/22/2015   CLINICAL DATA:  Central line placement.  Confirm line position.  EXAM: PORTABLE CHEST - 1 VIEW  COMPARISON:  07/26/2014.  FINDINGS: Cardiopericardial silhouette is enlarged. Opacity at the LEFT costophrenic angle may represent airspace disease or atelectasis.  There is no pneumothorax. RIGHT IJ central line is present. The tip of the line appears at the cavoatrial junction although this is difficult to visualize because of under penetration on this portable chest radiograph.  IMPRESSION: 1. Uncomplicated RIGHT IJ central line placement with the tip at the junction of the superior vena cava and RIGHT atrium. 2. Cardiomegaly without failure. 3. Opacity at the LEFT costophrenic angle may represent airspace disease or atelectasis.   Electronically Signed   By: Andreas Newport M.D.   On: 05/22/2015 14:37     Assessment and Plan   1. Abnormal ECG: Pt was noted to develop marked anterolateral ST depression and TWI while in OR on 8/19 for I & D of left foot. 12 lead ECG confirms these findings which are concerning for ischemia, although patient is completely asymptomatic w/o c/p or dyspnea. He does have a 20 yr h/o DM, HTN, and CKD III and thus is at risk for CAD and subendocardial ischemia. Cycled troponins have been flat trending at 0.05 x 2-->0.4. Echo showed low normal LV systolic function.  Nuclear stress test showed possible anterior wall ischemia but the study was suboptimal. Overall moderate risk study. Continue asa and statin. Cont bb.  Given acute on chronic kidney disease and osteomyelitis, I do not think it's an  optimal time for cardiac catheterization especially that he is completely asymptomatic. I recommend continuing medical therapy with outpatient cardiac evaluation to see if renal function improves.   2. L foot osteomyelitis: S/p I & D. Abx per ID/IM.  3. Essential HTN: stable on multiple meds.  4. Acute on CKD III: Creat has been variable. Up this AM. Follow. Per IM.  Likely 2/2 hypotension.  Will place hold parameters on antihypertensives.       Signed, Lorine Bears, MD  05/24/2015, 6:19 PM

## 2015-05-24 NOTE — Progress Notes (Signed)
3 Days Post-Op  Subjective: Patient seen. No specific complaints. The pain in the foot.  Objective: Vital signs in last 24 hours: Temp:  [98.2 F (36.8 C)-99.8 F (37.7 C)] 98.9 F (37.2 C) (08/22 0441) Pulse Rate:  [66-83] 81 (08/22 0441) Resp:  [18-22] 22 (08/22 0441) BP: (100-138)/(40-61) 132/61 mmHg (08/22 0441) SpO2:  [95 %-100 %] 98 % (08/22 0441) Last BM Date: 05/21/15  Intake/Output from previous day: 08/21 0701 - 08/22 0700 In: 110 [IV Piggyback:110] Out: -  Intake/Output this shift:    Minimal edema or erythema. There is still some minimal purulence on the drainage on the packing. Minimal erythema or edema. No growth yet on the culture, gram-negative rods on Gram stain  Lab Results:  No results for input(s): WBC, HGB, HCT, PLT in the last 72 hours. BMET  Recent Labs  05/23/15 0427 05/24/15 0425  NA 136 135  K 4.6 4.1  CL 109 107  CO2 20* 21*  GLUCOSE 125* 95  BUN 63* 69*  CREATININE 3.58* 3.40*  CALCIUM 8.1* 8.3*   PT/INR No results for input(s): LABPROT, INR in the last 72 hours. ABG No results for input(s): PHART, HCO3 in the last 72 hours.  Invalid input(s): PCO2, PO2  Studies/Results: Dg Chest Port 1 View  05/22/2015   CLINICAL DATA:  Central line placement.  Confirm line position.  EXAM: PORTABLE CHEST - 1 VIEW  COMPARISON:  07/26/2014.  FINDINGS: Cardiopericardial silhouette is enlarged. Opacity at the LEFT costophrenic angle may represent airspace disease or atelectasis.  There is no pneumothorax. RIGHT IJ central line is present. The tip of the line appears at the cavoatrial junction although this is difficult to visualize because of under penetration on this portable chest radiograph.  IMPRESSION: 1. Uncomplicated RIGHT IJ central line placement with the tip at the junction of the superior vena cava and RIGHT atrium. 2. Cardiomegaly without failure. 3. Opacity at the LEFT costophrenic angle may represent airspace disease or atelectasis.    Electronically Signed   By: Andreas Newport M.D.   On: 05/22/2015 14:37    Anti-infectives: Anti-infectives    Start     Dose/Rate Route Frequency Ordered Stop   05/22/15 2000  ciprofloxacin (CIPRO) tablet 500 mg     500 mg Oral 2 times daily 05/22/15 1143     05/22/15 1800  DAPTOmycin (CUBICIN) 500 mg in sodium chloride 0.9 % IVPB     500 mg 220 mL/hr over 30 Minutes Intravenous Every 24 hours 05/22/15 1131     05/21/15 1900  vancomycin (VANCOCIN) 1,500 mg in sodium chloride 0.9 % 500 mL IVPB  Status:  Discontinued     1,500 mg 250 mL/hr over 120 Minutes Intravenous Every 24 hours 05/21/15 0846 05/21/15 1603   05/21/15 1700  DAPTOmycin (CUBICIN) 530 mg in sodium chloride 0.9 % IVPB  Status:  Discontinued     530 mg 221.2 mL/hr over 30 Minutes Intravenous Every 24 hours 05/21/15 1613 05/21/15 1617   05/21/15 1700  DAPTOmycin (CUBICIN) 500 mg in sodium chloride 0.9 % IVPB  Status:  Discontinued     500 mg 220 mL/hr over 30 Minutes Intravenous Every 24 hours 05/21/15 1617 05/22/15 1131   05/21/15 1400  ciprofloxacin (CIPRO) tablet 500 mg  Status:  Discontinued     500 mg Oral Every 18 hours 05/21/15 0855 05/22/15 1143   05/20/15 2000  ciprofloxacin (CIPRO) tablet 500 mg  Status:  Discontinued     500 mg Oral 2 times daily  05/20/15 0933 05/21/15 0855   05/20/15 1600  vancomycin (VANCOCIN) 1,500 mg in sodium chloride 0.9 % 500 mL IVPB  Status:  Discontinued     1,500 mg 250 mL/hr over 120 Minutes Intravenous Every 24 hours 05/20/15 0533 05/21/15 0846   05/19/15 0800  ciprofloxacin (CIPRO) IVPB 400 mg  Status:  Discontinued     400 mg 200 mL/hr over 60 Minutes Intravenous Every 12 hours 05/18/15 2117 05/20/15 0933   05/19/15 0500  vancomycin (VANCOCIN) IVPB 1000 mg/200 mL premix  Status:  Discontinued     1,000 mg 200 mL/hr over 60 Minutes Intravenous Every 12 hours 05/18/15 2153 05/20/15 0527   05/18/15 2200  vancomycin (VANCOCIN) IVPB 1000 mg/200 mL premix     1,000 mg 200 mL/hr over  60 Minutes Intravenous  Once 05/18/15 2152 05/18/15 2351   05/18/15 2000  ciprofloxacin (CIPRO) IVPB 400 mg     400 mg 200 mL/hr over 60 Minutes Intravenous  Once 05/18/15 1959 05/18/15 2056   05/18/15 1945  vancomycin (VANCOCIN) IVPB 1000 mg/200 mL premix  Status:  Discontinued     1,000 mg 200 mL/hr over 60 Minutes Intravenous  Once 05/18/15 1937 05/18/15 2152   05/18/15 1945  piperacillin-tazobactam (ZOSYN) IVPB 3.375 g  Status:  Discontinued     3.375 g 12.5 mL/hr over 240 Minutes Intravenous  Once 05/18/15 1937 05/18/15 1959      Assessment/Plan: s/p Procedure(s): IRRIGATION AND DEBRIDEMENT FOOT (Left) The wound was repacked with sterile saline wet-to-dry packing into the wound. Should be stable for discharge with home health care for daily dressing changes. Plan for follow-up in a week and a half for evaluation and suture removal  LOS: 6 days    Kwali Wrinkle W. 05/24/2015

## 2015-05-24 NOTE — Plan of Care (Signed)
Problem: Phase I Progression Outcomes Goal: OOB as tolerated unless otherwise ordered Outcome: Progressing OOB with walker  Problem: Phase II Progression Outcomes Goal: Discharge plan established Outcome: Progressing picc placed for HH antibiotics

## 2015-05-24 NOTE — Progress Notes (Signed)
[facility] INFECTIOUS DISEASE PROGRESS NOTE Date of Admission:  05/18/2015     ID: Juan Roberson. is a 44 y.o. male with MRSA foot infection following puncture wound  Principal Problem:   Osteomyelitis of ankle or foot, acute Active Problems:   Foot abscess, left   Abnormal finding on EKG   Subjective: woudn improving, on dapto  ROS  Eleven systems are reviewed and negative except per hpi  Medications:  Antibiotics Given (last 72 hours)    Date/Time Action Medication Dose Rate   05/21/15 2155 Given   ciprofloxacin (CIPRO) tablet 500 mg 500 mg    05/21/15 2156 Given   DAPTOmycin (CUBICIN) 500 mg in sodium chloride 0.9 % IVPB 500 mg 220 mL/hr   05/22/15 0901 Given   ciprofloxacin (CIPRO) tablet 500 mg 500 mg    05/22/15 1823 Given   DAPTOmycin (CUBICIN) 500 mg in sodium chloride 0.9 % IVPB 500 mg 220 mL/hr   05/22/15 2118 Given   ciprofloxacin (CIPRO) tablet 500 mg 500 mg    05/23/15 0910 Given   ciprofloxacin (CIPRO) tablet 500 mg 500 mg    05/23/15 1745 Given   DAPTOmycin (CUBICIN) 500 mg in sodium chloride 0.9 % IVPB 500 mg 220 mL/hr   05/23/15 2102 Given   ciprofloxacin (CIPRO) tablet 500 mg 500 mg    05/24/15 1607 Given   ciprofloxacin (CIPRO) tablet 500 mg 500 mg      . amLODipine  10 mg Oral Daily  . aspirin EC  325 mg Oral Daily  . atorvastatin  80 mg Oral q1800  . carvedilol  25 mg Oral BID  . ciprofloxacin  500 mg Oral BID  . cloNIDine  0.2 mg Oral Daily  . DAPTOmycin (CUBICIN)  IV  500 mg Intravenous Q24H  . hydrALAZINE  50 mg Oral TID  . HYDROcodone-acetaminophen  1 tablet Oral BID  . insulin aspart  0-5 Units Subcutaneous QHS  . insulin aspart  0-9 Units Subcutaneous TID WC  . insulin glargine  16 Units Subcutaneous Q2200  . pantoprazole  40 mg Oral Daily  . regadenoson  0.4 mg Intravenous Once  . senna-docusate  2 tablet Oral BID  . sodium chloride  10-40 mL Intracatheter Q12H  . topiramate  25 mg Oral BID    Objective: Vital signs in  last 24 hours: Temp:  [98.2 F (36.8 C)-99.8 F (37.7 C)] 98.2 F (36.8 C) (08/22 1319) Pulse Rate:  [71-85] 85 (08/22 1319) Resp:  [16-22] 18 (08/22 1319) BP: (122-156)/(55-67) 156/67 mmHg (08/22 1319) SpO2:  [97 %-100 %] 99 % (08/22 1319) Constitutional: He is oriented to person, place, and time. He appears well-developed and well-nourished. No distress.  PICC R neck HENT: Mouth/Throat: Oropharynx is clear and moist. No oropharyngeal exudate.  Cardiovascular: Normal rate, regular rhythm and normal heart sounds. Pulmonary/Chest: Effort normal and breath sounds normal. No respiratory distress. He has no wheezes.  Abdominal: Soft. Bowel sounds are normal. He exhibits no distension. There is no tenderness.  Lymphadenopathy: He has no cervical adenopathy.  Neurological: He is alert and oriented to person, place, and time.  Ext Lfoot wrapped post op Psychiatric: He has a normal mood and affect. His behavior is normal.   Lab Results  Recent Labs  05/23/15 0427 05/24/15 0425  NA 136 135  K 4.6 4.1  CL 109 107  CO2 20* 21*  BUN 63* 69*  CREATININE 3.58* 3.40*    Results for orders placed or performed during the hospital  encounter of 05/18/15  Blood culture (routine x 2)     Status: None   Collection Time: 05/18/15  8:01 PM  Result Value Ref Range Status   Specimen Description BLOOD RIGHT ARM  Final   Special Requests BOTTLES DRAWN AEROBIC AND ANAEROBIC 2CC  Final   Culture NO GROWTH 5 DAYS  Final   Report Status 05/23/2015 FINAL  Final  Blood culture (routine x 2)     Status: None   Collection Time: 05/18/15  8:01 PM  Result Value Ref Range Status   Specimen Description BLOOD LEFT HAND  Final   Special Requests BOTTLES DRAWN AEROBIC AND ANAEROBIC 3CC  Final   Culture NO GROWTH 5 DAYS  Final   Report Status 05/23/2015 FINAL  Final  Anaerobic culture     Status: None (Preliminary result)   Collection Time: 05/21/15  2:07 PM  Result Value Ref Range Status   Specimen  Description WOUND  Final   Special Requests NONE  Final   Culture NO GROWTH 2 DAYS  Final   Report Status PENDING  Incomplete  Wound culture     Status: None   Collection Time: 05/21/15  2:07 PM  Result Value Ref Range Status   Specimen Description WOUND  Final   Special Requests NONE  Final   Gram Stain RARE WBC SEEN RARE GRAM NEGATIVE COCCI   Final   Culture NO GROWTH 3 DAYS  Final   Report Status 05/24/2015 FINAL  Final   Microbiology:  Assessment/Plan: Juan Roberson. is a 44 y.o. male with hx DM and CKD who injured his L foot when he stepped on a screw at work on June 30th, He had to have I and D of the wound July 5th and has been treated as otpt with oral antibiotics however the wound has worsened. There was some concern for osteomyelitis and MRI done revealed osteomyelitis in the third toe with continued abscess in the forefoot. He developed NV and was admitted for IV abx and surgery. We were consulted for further abx management He had cultures done as otpt which grew Juny 4th MSSA and Klebsiella. Current wound cx with MRSA and Group B strep. S/p I and D 8/19 - removal of infected bone and tissue. ESR 58, CRP 2,4 Recommendations He  will need likely 4 weeks IV abx for the MRSA infection however given CKD I am hesitant to use vancomycin. Will continue daptomycin 6 mg/kg. Check cpk  Weekly Will dose for GFR < 30 with Q 48 hour dosing for now Continue oral cipro as may have gram negatives as well since Klebsiella noted before. Following IV course will likely need several weeks oral abx as well. Thank you very much for the consult. Will follow with you.  [place], [place]   05/24/2015, 3:52 PM

## 2015-05-24 NOTE — Care Management (Signed)
Patient was transferred to 2A from 1A for chest pain.  Had lexiscan today which may show some ischemia.  Awaiting input from cardiology. Spoke with Lucienne Capers with [facility]- 1 [phone] (Fax [phone])  Home health agency that will be providing the home health nursing is [facility] - point of contact is karen [phone].  Pharmacy that will be providing the IV antibiotics is [facility].  Point of contact is Jasmine December- 1 [phone].  REquested attending to enter the order for home health and pharmacy.  The pharmacy order that has been entered does not have duration or PICC line maintenance orders.  Faxed what is present to Lucienne Capers- fulling knowing the order is not complete.  At present, would anticipate discharge within 24 hours unless cardiology recommends further cardiac work up

## 2015-05-24 NOTE — Progress Notes (Addendum)
Infectious Disease Long Term IV Antibiotic Orders  Diagnosis osteomyelitis Foot Culture results MRSA  Allergies:  Allergies  Allergen Reactions  . Sulfur Other (See Comments)    Renal failure    Discharge antibiotics Daptomycin 500 mg every  24  hours   (weekly CPK). Oral cipro 500 po bid  PICC Care per protocol Labs weekly while on IV antibiotics (circle)      CBC w diff   Comprehensive met panel CRP  CPK (for daptomycin)  Stop date Tentative 06/18/15 Follow up clinic dateTBD  FAX weekly labs to [phone]  David P Fitzgerald, MD  

## 2015-05-24 NOTE — Progress Notes (Signed)
[facility] [place] [facility] [place]   PATIENT NAME: Juan Roberson    MR#:  [id number]  DATE OF BIRTH:  24-Nov-1970  SUBJECTIVE:  CHIEF COMPLAINT:   Chief Complaint  Patient presents with  . Foot Pain   Patient here with left foot pain and swelling and MRI findings suggestive of osteomyelitis on the left third toe. Also noted to have an abscess between the 3rd & 4th toe web space. S/p incision and drainage of left forefoot abscess and debridement of infected bone of proximal phalanx of left third toe 19th of August 2016 by Dr. Alberteen Spindle. Patient denies any significant pains and feels comfortable. Foot was redressed today by Dr. Alberteen Spindle on the minimal blood was needed noted on the dressing. Minimal purulence.  Lexiscan stress test done today is abnormal. Blood cultures from 05/18/2015 and negative. Wound cultures 19th of August 2016 no growth, on daptomycin and ciprofloxacin at present. PICC line was placed 20th of August 2016. Had dressing changes done earlier by podiatry REVIEW OF SYSTEMS:    Review of Systems  Constitutional: Negative for fever and chills.  HENT: Negative for congestion and tinnitus.   Eyes: Negative for blurred vision and double vision.  Respiratory: Negative for cough, shortness of breath and wheezing.   Cardiovascular: Negative for chest pain, orthopnea and PND.  Gastrointestinal: Negative for nausea, vomiting, abdominal pain and diarrhea.  Genitourinary: Negative for dysuria and hematuria.  Musculoskeletal: Positive for joint pain (left foot pain).  Neurological: Negative for dizziness, sensory change and focal weakness.  All other systems reviewed and are negative.   Nutrition: Diabetic Tolerating Diet: Yes Tolerating PT: Ambulatory  DRUG ALLERGIES:   Allergies  Allergen Reactions  . Sulfur Other (See Comments)    Renal failure    VITALS:  Blood pressure 156/67, pulse 85, temperature 98.2 F (36.8 C), temperature source Oral, resp.  rate 18, height 6\' 3"  (1.905 m), weight 95.3 kg (210 lb 1.6 oz), SpO2 99 %.  PHYSICAL EXAMINATION:   Physical Exam  GENERAL:  44 y.o.-year-old patient lying in the bed with no acute distress.  EYES: Pupils equal, round, reactive to light and accommodation. No scleral icterus. Extraocular muscles intact.  HEENT: Head atraumatic, normocephalic. Oropharynx and nasopharynx clear.  NECK:  Supple, no jugular venous distention. No thyroid enlargement, no tenderness.  LUNGS: Normal breath sounds bilaterally, no wheezing, rales, rhonchi. No use of accessory muscles of respiration.  CARDIOVASCULAR: S1, S2 normal. No murmurs, rubs, or gallops.  ABDOMEN: Soft, nontender, nondistended. Bowel sounds present. No organomegaly or mass.  EXTREMITIES: No cyanosis, clubbing or edema b/l.  Left plantar foot swelling, redness, open sore with minimal drainage.     NEUROLOGIC: Cranial nerves II through XII are intact. No focal Motor or sensory deficits b/l.   PSYCHIATRIC: The patient is alert and oriented x 3. Good affect SKIN: No obvious rash, lesion, or left foot plantar ulcer. Left foot reveals the incisions anterior aspect of the foot with minimal purulence. No significant bleeding. No significant swelling, no tenderness on palpation. Redressed by Dr. Alberteen Spindle today LABORATORY PANEL:   CBC  Recent Labs Lab 05/19/15 0514  WBC 6.7  HGB 8.7*  HCT 28.2*  PLT 175   ------------------------------------------------------------------------------------------------------------------  Chemistries   Recent Labs Lab 05/24/15 0425  NA 135  K 4.1  CL 107  CO2 21*  GLUCOSE 95  BUN 69*  CREATININE 3.40*  CALCIUM 8.3*   ------------------------------------------------------------------------------------------------------------------  Cardiac Enzymes  Recent Labs Lab 05/22/15 1034  TROPONINI  0.04*    ------------------------------------------------------------------------------------------------------------------  RADIOLOGY:  Nm Myocar Multi W/spect W/wall Motion / Ef  05/24/2015    Defect 1: There is a medium defect of moderate severity present in the  mid anterior, apical anterior and apex location which is partially  reversible.  Findings consistent with anterior ischemia. However, the study is  suboptimal due to GI uptake.  This is an intermediate risk study.  The left ventricular ejection fraction is moderately decreased (30-44%).  Calculated EF is 39% but visually appears around 50%.    ASSESSMENT AND PLAN:   44 year old male with history of diabetes, hypertension, chronic kidney disease stage III, GERD, history of left foot cellulitis who presents to the hospital but the left foot swelling and redness and pain.  #1 left foot abscess as well as a osteomyelitis of the third toe proximal phalanx, status post incision and debridement and drainage of abscess , PICC line will be placed 20th of August 2016 and patient to be continued on daptomycin IV and ciprofloxacin orally for 4 weeks per Dr. Jarrett Ables recommendations . Patient will need 4 weeks therapy was likely oral antibiotics afterwards.   #2 chronic kidney disease stage III-patient's creatinine is close to baseline. We'll continue to monitor renal function.  #3 hypertension, patient was relatively hypotensive yesterday. However, patient's blood pressure rebounds today-continue clonidine, Coreg, amlodipine. Blood pressure is overall well controlled  #4 diabetic neuropathy-continue Neurontin, Lyrica.   #5 diabetes with renal complication-continue Lantus, sliding scale insulin.Fasting blood glucose level is 119  today  #6 GERD-continue Protonix.  # 7. Elevated troponin. Appreciate cardiology involvement. Patient is to continue aspirin, Lipitor, Coreg, he had echocardiogram which revealed normal ejection fraction but moderate  MR and TR. stress test is abnormal and shows anterior ischemia. D/w Dr Kirke Corin - will likely need cath at some point but knowing his kidney function, not ideal candidate at this time.  #8  Recent diagnosis of left lower extremity DVT, discussed with Dr. Evie Lacks by Dr Winona Legato, no Coumadin is recommended , other than full aspirin therapy.   Likely d/c home tomorrow.   All the records are reviewed and case discussed with Care Management/Social Worker. Management plans discussed with the patient, family and they are in agreement.  CODE STATUS: Full code  DVT Prophylaxis: Ambulatory  TOTAL TIME TAKING CARE OF THIS PATIENT: 40 minutes.    POSSIBLE D/C  tomorrow.   [facility], Quintrell Baze M.D on 05/24/2015 at 4:54 PM  Between 7am to 6pm - Pager - [phone]  After 6pm go to [email or web address] - password [email or web address] [facility]  [facility]  Office  [phone]  CC: Primary care physician; Corky Downs, MD

## 2015-05-24 NOTE — Progress Notes (Signed)
To Nuclear medicine for stress test

## 2015-05-24 NOTE — Progress Notes (Signed)
Assessment completed. No distress on ra.  Cardiac monitor in place, pt denies chest pain.  Dressing to lt foot dry and intact, up on pillow.  SL lf fa flushes well.  Rt IJ PICC inplace.  Remains NPO for am stress test. Denies need, wife at bedside.  CB in reach, SR up x2.

## 2015-05-25 LAB — GLUCOSE, CAPILLARY
GLUCOSE-CAPILLARY: 176 mg/dL — AB (ref 65–99)
Glucose-Capillary: 261 mg/dL — ABNORMAL HIGH (ref 65–99)

## 2015-05-25 LAB — ANAEROBIC CULTURE

## 2015-05-25 LAB — SURGICAL PATHOLOGY

## 2015-05-25 MED ORDER — CIPROFLOXACIN HCL 500 MG PO TABS
500.0000 mg | ORAL_TABLET | Freq: Two times a day (BID) | ORAL | Status: AC
Start: 2015-05-25 — End: 2015-06-18

## 2015-05-25 MED ORDER — SODIUM CHLORIDE 0.9 % IV SOLN: 500.0000 mg | INTRAVENOUS | Status: DC

## 2015-05-25 NOTE — Discharge Instructions (Signed)
Bone and Joint Infections °Joint infections are called septic or infectious arthritis. An infected joint may damage cartilage and tissue very quickly. This may destroy the joint. Bone infections (osteomyelitis) may last for years. Joints may become stiff if left untreated. Bacteria are the most common cause. Other causes include viruses and fungi, but these are more rare. Bone and joint infections usually come from injury or infection elsewhere in your body; the germs are carried to your bones or joints through the bloodstream.  °CAUSES  °· Blood-carried germs from an infection elsewhere in your body can eventually spread to a bone or joint. The germ staphylococcus is the most common cause of both osteomyelitis and septic arthritis. °· An injury can introduce germs into your bones or joints. °SYMPTOMS  °· Weight loss. °· Tiredness. °· Chills and fever. °· Bone or joint pain at rest and with activity. °· Tenderness when touching the area or bending the joint. °· Refusal to bear weight on a leg or inability to use an arm due to pain. °· Decreased range of motion in a joint. °· Skin redness, warmth, and tenderness. °· Open skin sores and drainage. °RISK FACTORS °Children, the elderly, and those with weak immune systems are at increased risk of bone and joint infections. It is more common in people with HIV infections and with people on chemotherapy. People are also at increased risk if they have surgery where metal implants are used to stabilize the bone. Plates, screws, or artificial joints provide a surface that bacteria can stick on. Such a growth of bacteria is called biofilm. The biofilm protects bacteria from antibiotics and bodily defenses. This allows germs to multiply. Other reasons for increased risks include:  °· Having previous surgery or injury of a bone or joint. °· Being on high-dose corticosteroids and immunosuppressive medications that weaken your body's resistance to germs. °· Diabetes and  long-standing diseases. °· Use of intravenous street drugs. °· Being on hemodialysis. °· Having a history of urinary tract infections. °· Removal of your spleen (splenectomy). This weakens your immunity. °· Chronic viral infections such as HIV or AIDS. °· Lack of sensation such as paraplegia, quadriplegia, or spina bifida. °DIAGNOSIS  °· Increased numbers of white blood cells in your blood may indicate infection. Some times your caregivers are able to identify the infecting germs by testing your blood. Inflammatory markers present in your bloodstream such as an erythrocyte sedimentation rate (ESR or sed rate) or c-reactive protein (CRP) can be indicators of deep infection. °· Bone scans and X-ray exams are necessary for diagnosing osteomyelitis. They may help your caregiver find the infected areas. Other studies may give more detailed information. They may help detect fluid collections around a joint, abnormal bone surfaces, or be useful in diagnosing septic arthritis. They can find soft tissue swelling and find excess fluid in an infected joint or the adjacent bone. These tests include: °¨ Ultrasound. °¨ CT (computerized tomography). °¨ MRI (magnetic resonance imaging). °· The best test for diagnosing a bone or joint infection is an aspiration or biopsy. Your caregiver will usually use a local anesthetic. He or she can then remove tissue from a bone injury or use a needle to take fluids from an infected joint. A local anesthetic medication numbs the area to be biopsied. Often biopsies are done in the operating room under general anesthesia. This means you will be asleep during the procedure. Tests performed on these samples can identify an infection. °TREATMENT  °· Treatment can help control long-standing   infections, but infections may come back.  Infections can infect any bone or joint at any age.  Bone and joint infections are rarely fatal.  Bone infection left untreated can become a never-ending infection.  It can spread to other areas of your body. It may eventually cause bone death. Reduced limb or joint function can result. In severe cases, this may require removal of a limb. Spinal osteomyelitis is very dangerous. Untreated, it may damage spinal nerves and cause death.  The most common complication of septic arthritis is osteoarthritis with pain and decreased range of motion of the joint. Some forms of treatment may include:  If the infection is caused by bacteria, it is generally treated with antibiotics. You will likely receive the drugs through a vein (intravenously) for anywhere from 2 to 6 weeks. In some cases, especially with children, oral antibiotics following an initial intravenous dose may be effective. The treatment you receive depends on the:  Type of bacteria.  Location of the infection.  Type of surgery that might be done.  Other health conditions or issues you might have.  Your caregiver may drain soft tissue abscesses or pockets of fluid around infected bones or joints. If you have septic arthritis, your caregiver may use a needle to drain pus from the joint on a daily basis. He or she may use an arthroscope to clean the joint or may need to open the joint surgically to remove damaged tissue and infection. An arthroscope is an instrument like a thin lighted telescope. It can be used to look inside the joint.  Surgery is usually needed if the infection has become long-standing. It may also be needed if there is hardware (such as metal plates, screws, or artificial joints) inside the patient. Sometimes a bone or muscle graft is needed to fill in the open space. This promotes growth of new tissues and better blood flow to the area. PREVENTION   Clean and disinfect wounds quickly to help prevent the start of a bone or joint infection. Get treatment for any infections to prevent spread to a bone or joint.  Do not smoke. Smoking decreases healing rates of bone and predisposes to  infection.  When given medications that suppress your immune system, use them according to your caregiver's instructions. Do not take more than prescribed for your condition.  Take good care of your feet and skin, especially if you have diabetes, decreased sensation or circulation problems. SEEK IMMEDIATE MEDICAL CARE IF:   You cannot bear weight on a leg or use an arm, especially following a minor injury. This can be a sign of bone or joint infection.  You think you may have signs or symptoms of a bone or joint infection. Your chance of getting rid of an infection is better if treated early. Document Released: 09/18/2005 Document Revised: 12/11/2011 Document Reviewed: 08/18/2009 [facility] Patient Information 2015 [place], [place]. This information is not intended to replace advice given to you by your health care provider. Make sure you discuss any questions you have with your health care provider.

## 2015-05-25 NOTE — Progress Notes (Signed)
4 Days Post-Op  Subjective: Patient seen. No complaints  Objective: Vital signs in last 24 hours: Temp:  [98 F (36.7 C)-98.4 F (36.9 C)] 98.4 F (36.9 C) (08/23 0530) Pulse Rate:  [73-85] 81 (08/23 0530) Resp:  [16-18] 16 (08/23 0530) BP: (117-156)/(50-67) 131/59 mmHg (08/23 0530) SpO2:  [97 %-99 %] 98 % (08/23 0530) Weight:  [94.439 kg (208 lb 3.2 oz)] 94.439 kg (208 lb 3.2 oz) (08/23 0530) Last BM Date: 05/24/15  Intake/Output from previous day: 08/22 0701 - 08/23 0700 In: 610 [P.O.:480; I.V.:20; IV Piggyback:110] Out: 0  Intake/Output this shift:    No significant erythema or edema. Decreasing drainage on the bandaging. Minimal purulence from the packing. Incisions well coapted.  Lab Results:  No results for input(s): WBC, HGB, HCT, PLT in the last 72 hours. BMET  Recent Labs  05/23/15 0427 05/24/15 0425  NA 136 135  K 4.6 4.1  CL 109 107  CO2 20* 21*  GLUCOSE 125* 95  BUN 63* 69*  CREATININE 3.58* 3.40*  CALCIUM 8.1* 8.3*   PT/INR No results for input(s): LABPROT, INR in the last 72 hours. ABG No results for input(s): PHART, HCO3 in the last 72 hours.  Invalid input(s): PCO2, PO2  Studies/Results: Nm Myocar Multi W/spect W/wall Motion / Ef  05/24/2015    Defect 1: There is a medium defect of moderate severity present in the  mid anterior, apical anterior and apex location which is partially  reversible.  Findings consistent with anterior ischemia. However, the study is  suboptimal due to GI uptake.  This is an intermediate risk study.  The left ventricular ejection fraction is moderately decreased (30-44%).  Calculated EF is 39% but visually appears around 50%.     Anti-infectives: Anti-infectives    Start     Dose/Rate Route Frequency Ordered Stop   05/25/15 1745  DAPTOmycin (CUBICIN) 500 mg in sodium chloride 0.9 % IVPB  Status:  Discontinued     500 mg 220 mL/hr over 30 Minutes Intravenous Every 48 hours 05/24/15 1557 05/24/15 1608   05/25/15  0000  ciprofloxacin (CIPRO) 500 MG tablet     500 mg Oral 2 times daily 05/25/15 0738 06/18/15 2359   05/25/15 0000  DAPTOmycin 500 mg in sodium chloride 0.9 % 100 mL     500 mg 220 mL/hr over 30 Minutes Intravenous Every 24 hours 05/25/15 0738     05/24/15 1800  DAPTOmycin (CUBICIN) 500 mg in sodium chloride 0.9 % IVPB     500 mg 220 mL/hr over 30 Minutes Intravenous Every 24 hours 05/24/15 1608     05/24/15 0000  ciprofloxacin (CIPRO) 500 MG tablet  Status:  Discontinued     500 mg Oral 2 times daily 05/24/15 1514 05/25/15    05/24/15 0000  DAPTOmycin 500 mg in sodium chloride 0.9 % 100 mL  Status:  Discontinued     500 mg 220 mL/hr over 30 Minutes Intravenous Every 24 hours 05/24/15 1514 05/25/15    05/22/15 2000  ciprofloxacin (CIPRO) tablet 500 mg     500 mg Oral 2 times daily 05/22/15 1143     05/22/15 1800  DAPTOmycin (CUBICIN) 500 mg in sodium chloride 0.9 % IVPB  Status:  Discontinued     500 mg 220 mL/hr over 30 Minutes Intravenous Every 24 hours 05/22/15 1131 05/24/15 1557   05/21/15 1900  vancomycin (VANCOCIN) 1,500 mg in sodium chloride 0.9 % 500 mL IVPB  Status:  Discontinued     1,500  mg 250 mL/hr over 120 Minutes Intravenous Every 24 hours 05/21/15 0846 05/21/15 1603   05/21/15 1700  DAPTOmycin (CUBICIN) 530 mg in sodium chloride 0.9 % IVPB  Status:  Discontinued     530 mg 221.2 mL/hr over 30 Minutes Intravenous Every 24 hours 05/21/15 1613 05/21/15 1617   05/21/15 1700  DAPTOmycin (CUBICIN) 500 mg in sodium chloride 0.9 % IVPB  Status:  Discontinued     500 mg 220 mL/hr over 30 Minutes Intravenous Every 24 hours 05/21/15 1617 05/22/15 1131   05/21/15 1400  ciprofloxacin (CIPRO) tablet 500 mg  Status:  Discontinued     500 mg Oral Every 18 hours 05/21/15 0855 05/22/15 1143   05/20/15 2000  ciprofloxacin (CIPRO) tablet 500 mg  Status:  Discontinued     500 mg Oral 2 times daily 05/20/15 0933 05/21/15 0855   05/20/15 1600  vancomycin (VANCOCIN) 1,500 mg in sodium  chloride 0.9 % 500 mL IVPB  Status:  Discontinued     1,500 mg 250 mL/hr over 120 Minutes Intravenous Every 24 hours 05/20/15 0533 05/21/15 0846   05/19/15 0800  ciprofloxacin (CIPRO) IVPB 400 mg  Status:  Discontinued     400 mg 200 mL/hr over 60 Minutes Intravenous Every 12 hours 05/18/15 2117 05/20/15 0933   05/19/15 0500  vancomycin (VANCOCIN) IVPB 1000 mg/200 mL premix  Status:  Discontinued     1,000 mg 200 mL/hr over 60 Minutes Intravenous Every 12 hours 05/18/15 2153 05/20/15 0527   05/18/15 2200  vancomycin (VANCOCIN) IVPB 1000 mg/200 mL premix     1,000 mg 200 mL/hr over 60 Minutes Intravenous  Once 05/18/15 2152 05/18/15 2351   05/18/15 2000  ciprofloxacin (CIPRO) IVPB 400 mg     400 mg 200 mL/hr over 60 Minutes Intravenous  Once 05/18/15 1959 05/18/15 2056   05/18/15 1945  vancomycin (VANCOCIN) IVPB 1000 mg/200 mL premix  Status:  Discontinued     1,000 mg 200 mL/hr over 60 Minutes Intravenous  Once 05/18/15 1937 05/18/15 2152   05/18/15 1945  piperacillin-tazobactam (ZOSYN) IVPB 3.375 g  Status:  Discontinued     3.375 g 12.5 mL/hr over 240 Minutes Intravenous  Once 05/18/15 1937 05/18/15 1959      Assessment/Plan: s/p Procedure(s): IRRIGATION AND DEBRIDEMENT FOOT (Left) Plan: The wound was repacked with sterile saline gauze. Patient scheduled for discharge later today. We will follow-up with the patient outpatient next week. Home health to manage his dressing changes.  LOS: 7 days    Britney Newstrom W. 05/25/2015

## 2015-05-25 NOTE — Care Management (Signed)
Patient is for definite discharge home today.  Spoke with Juan Roberson at [facility] and faxed the home health nursing orders, wound care orders, IV orders.  Aware of the need for labs.  Patient will receive his dose of Daptomycin this day prior to discharge.  He will require nursing visit starting 8/24 for daily wound care and IV administration.  Received confirmation of fax sent today which requests call to CM for questions/ concerns.  Patient has been informed of this plan and is in agreement.  Says he has been contacted by  The pharmacy and home health agency that will be providing care.

## 2015-05-26 LAB — GLUCOSE, CAPILLARY
GLUCOSE-CAPILLARY: 160 mg/dL — AB (ref 65–99)
GLUCOSE-CAPILLARY: 191 mg/dL — AB (ref 65–99)
Glucose-Capillary: 94 mg/dL (ref 65–99)

## 2015-05-26 NOTE — Discharge Summary (Signed)
[facility] [place] at [facility]   PATIENT NAME: Juan Roberson    MR#:  [id number]  DATE OF BIRTH:  Nov 10, 1970  DATE OF ADMISSION:  05/18/2015 ADMITTING PHYSICIAN: Shaune Pollack, MD  DATE OF DISCHARGE: 05/25/2015  1:33 PM  PRIMARY CARE PHYSICIAN: MASOUD,JAVED, MD    ADMISSION DIAGNOSIS:  Phlegmon [L03.90] Infection [B99.9] Osteomyelitis of left foot [M86.9]  DISCHARGE DIAGNOSIS:  Principal Problem:   Osteomyelitis of ankle or foot, acute Active Problems:   Foot abscess, left   Abnormal finding on EKG CKD 3 SECONDARY DIAGNOSIS:   Past Medical History  Diagnosis Date  . Diabetes mellitus without complication     a. Dx ~ 1996.  . Essential hypertension   . CKD (chronic kidney disease), stage III   . GERD (gastroesophageal reflux disease)   . Cellulitis and abscess of foot     Left-Dr. Ether Griffins  . Osteomyelitis     a. 05/2015 L foot.  . Left leg DVT     a. Dx 05/2015 -> Coumadin.  . Gastritis     a. 04/2015 hematemesis -> EGD: gastritis, esophagitis, duodenitis.  No active bleeding.  PPI added.    HOSPITAL COURSE:  44 year old male with history of diabetes, hypertension, chronic kidney disease stage III, GERD, history of left foot cellulitis WAS ADMITTED to the hospital for left foot swelling, redness and pain. He was found to have left foot abscess as well as a osteomyelitis of the third toe proximal phalanx. Please see Dr Nicky Pugh dictated H & P for further details. Podiatry C/S was obtained with Dr Alberteen Spindle who performed incision and drainage of left forefoot abscess and debridement of infected bone of proximal phalanx of left third toe 19th of August 2016. Post-op patient was evaluated by ID who recommended IV Daptomycin thru PICC line for 4 weeks and also PO cipro for 4 weeks based on wound c/s. While in the Hospital patient was noted to have abnormal EKG for which Cardio c/s was obtained who recommended myoview and echo which were obtained. Echo showed low  normal LV systolic function. Nuclear stress test showed possible anterior wall ischemia but the study was suboptimal. Overall moderate risk study per Cardio.  As patient had acute on chronic kidney disease and osteomyelitis, Cardio did not think it's an optimal time for cardiac catheterization especially that he was completely asymptomatic. They recommend continuing medical therapy with outpatient cardiac evaluation to see if renal function improves.   Patient was feeling much better by 23rd Aug and was D/C home with Home health services set up by CM. He was agreeable with D/C plans. DISCHARGE CONDITIONS:  Stable CONSULTS OBTAINED:  Treatment Team:  Clydie Braun, MD Iran Ouch, MD Linus Galas, MD DRUG ALLERGIES:   Allergies  Allergen Reactions  . Sulfur Other (See Comments)    Renal failure   DISCHARGE MEDICATIONS:   Discharge Medication List as of 05/25/2015  1:00 PM    START taking these medications   Details  albuterol (PROVENTIL) (2.5 MG/3ML) 0.083% nebulizer solution Take 3 mLs (2.5 mg total) by nebulization every 2 (two) hours as needed for wheezing., Starting 05/24/2015, Until Discontinued, Normal    aspirin EC 325 MG EC tablet Take 1 tablet (325 mg total) by mouth daily., Starting 05/24/2015, Until Discontinued, Normal    atorvastatin (LIPITOR) 80 MG tablet Take 1 tablet (80 mg total) by mouth daily at 6 PM., Starting 05/24/2015, Until Discontinued, Normal    insulin aspart (NOVOLOG) 100 UNIT/ML injection Inject  0-9 Units into the skin 3 (three) times daily with meals., Starting 05/24/2015, Until Discontinued, Normal    oxyCODONE (OXY IR/ROXICODONE) 5 MG immediate release tablet Take 1 tablet (5 mg total) by mouth every 4 (four) hours as needed for moderate pain., Starting 05/24/2015, Until Discontinued, Print    senna-docusate (SENOKOT-S) 8.6-50 MG per tablet Take 2 tablets by mouth 2 (two) times daily., Starting 05/24/2015, Until Discontinued, Normal      CONTINUE  these medications which have CHANGED   Details  ciprofloxacin (CIPRO) 500 MG tablet Take 1 tablet (500 mg total) by mouth 2 (two) times daily., Starting 05/25/2015, Until Fri 06/18/15, Normal    DAPTOmycin 500 mg in sodium chloride 0.9 % 100 mL Inject 500 mg into the vein daily., Starting 05/25/2015, Until Discontinued, Print      CONTINUE these medications which have NOT CHANGED   Details  cloNIDine (CATAPRES) 0.2 MG tablet Take 0.2 mg by mouth daily., Starting 04/30/2015, Until Discontinued, Historical Med    pantoprazole (PROTONIX) 40 MG tablet Take 1 tablet (40 mg total) by mouth daily., Starting 04/08/2015, Until Discontinued, Print    patiromer Lelon Perla) 8.4 G packet Take 8.4 g by mouth daily., Until Discontinued, Historical Med    topiramate (TOPAMAX) 25 MG tablet Take 25 mg by mouth 2 (two) times daily., Starting 04/12/2015, Until Discontinued, Historical Med    TRESIBA FLEXTOUCH 200 UNIT/ML SOPN Inject 16 Units into the skin daily., Starting 03/29/2015, Until Discontinued, Historical Med    amLODipine (NORVASC) 10 MG tablet Take 1 tablet (10 mg total) by mouth daily., Starting 04/08/2015, Until Discontinued, Print    carvedilol (COREG) 25 MG tablet Take 50 mg by mouth 2 (two) times daily., Starting 03/09/2015, Until Discontinued, Historical Med    DULoxetine (CYMBALTA) 30 MG capsule Take 30 mg by mouth 2 (two) times daily., Starting 03/26/2015, Until Discontinued, Historical Med    furosemide (LASIX) 40 MG tablet Take 40 mg by mouth daily., Until Discontinued, Historical Med    gabapentin (NEURONTIN) 300 MG capsule Take 300 mg by mouth daily., Starting 03/04/2015, Until Discontinued, Historical Med    glyBURIDE (DIABETA) 5 MG tablet Starting 05/18/2015, Until Discontinued, Historical Med    hydrALAZINE (APRESOLINE) 100 MG tablet Take 100 mg by mouth 3 (three) times daily., Starting 04/17/2015, Until Discontinued, Historical Med    HYDROcodone-acetaminophen (NORCO) 7.5-325 MG per tablet Take  1 tablet by mouth 2 (two) times daily., Starting 04/30/2015, Until Discontinued, Historical Med    IODOSORB 0.9 % gel Apply 1 application topically daily as needed., Starting 04/16/2015, Until Discontinued, Historical Med    LYRICA 75 MG capsule Take 75 mg by mouth 3 (three) times daily., Starting 03/08/2015, Until Discontinued, Historical Med    ondansetron (ZOFRAN) 4 MG tablet Take 1 tablet by mouth as needed., Starting 04/13/2015, Until Discontinued, Historical Med    traMADol (ULTRAM) 50 MG tablet Take 1 tablet by mouth every 4 (four) hours as needed., Starting 05/13/2015, Until Discontinued, Historical Med      STOP taking these medications     clindamycin (CLEOCIN) 300 MG capsule      doxycycline (VIBRA-TABS) 100 MG tablet      warfarin (COUMADIN) 5 MG tablet        DISCHARGE INSTRUCTIONS:   DIET:  Cardiac diet DISCHARGE CONDITION:  Good ACTIVITY:  Activity as tolerated OXYGEN:  Home Oxygen: No.  Oxygen Delivery: room air DISCHARGE LOCATION:  home   If you experience worsening of your admission symptoms, develop shortness of breath, life  threatening emergency, suicidal or homicidal thoughts you must seek medical attention immediately by calling 911 or calling your MD immediately  if symptoms less severe.  You Must read complete instructions/literature along with all the possible adverse reactions/side effects for all the Medicines you take and that have been prescribed to you. Take any new Medicines after you have completely understood and accpet all the possible adverse reactions/side effects.   Please note  You were cared for by a hospitalist during your hospital stay. If you have any questions about your discharge medications or the care you received while you were in the hospital after you are discharged, you can call the unit and asked to speak with the hospitalist on call if the hospitalist that took care of you is not available. Once you are discharged, your primary  care physician will handle any further medical issues. Please note that NO REFILLS for any discharge medications will be authorized once you are discharged, as it is imperative that you return to your primary care physician (or establish a relationship with a primary care physician if you do not have one) for your aftercare needs so that they can reassess your need for medications and monitor your lab values.    On the day of Discharge: VITAL SIGNS:  Blood pressure 138/56, pulse 69, temperature 98.5 F (36.9 C), temperature source Oral, resp. rate 20, height 6\' 3"  (1.905 m), weight 94.439 kg (208 lb 3.2 oz), SpO2 99 %. PHYSICAL EXAMINATION:  GENERAL:  44 y.o.-year-old patient lying in the bed with no acute distress.  EYES: Pupils equal, round, reactive to light and accommodation. No scleral icterus. Extraocular muscles intact.  HEENT: Head atraumatic, normocephalic. Oropharynx and nasopharynx clear.  NECK:  Supple, no jugular venous distention. No thyroid enlargement, no tenderness.  LUNGS: Normal breath sounds bilaterally, no wheezing, rales,rhonchi or crepitation. No use of accessory muscles of respiration.  CARDIOVASCULAR: S1, S2 normal. No murmurs, rubs, or gallops.  ABDOMEN: Soft, non-tender, non-distended. Bowel sounds present. No organomegaly or mass.  EXTREMITIES: No pedal edema, cyanosis, or clubbing.  NEUROLOGIC: Cranial nerves II through XII are intact. Muscle strength 5/5 in all extremities. Sensation intact. Gait not checked.  PSYCHIATRIC: The patient is alert and oriented x 3.  SKIN: No obvious rash, lesion, or ulcer.  DATA REVIEW:   CBC No results for input(s): WBC, HGB, HCT, PLT in the last 168 hours.  Chemistries   Recent Labs Lab 05/24/15 0425  NA 135  K 4.1  CL 107  CO2 21*  GLUCOSE 95  BUN 69*  CREATININE 3.40*  CALCIUM 8.3*    Cardiac Enzymes  Recent Labs Lab 05/22/15 1034  TROPONINI 0.04*    Microbiology Results  Results for orders placed or  performed during the hospital encounter of 05/18/15  Blood culture (routine x 2)     Status: None   Collection Time: 05/18/15  8:01 PM  Result Value Ref Range Status   Specimen Description BLOOD RIGHT ARM  Final   Special Requests BOTTLES DRAWN AEROBIC AND ANAEROBIC 2CC  Final   Culture NO GROWTH 5 DAYS  Final   Report Status 05/23/2015 FINAL  Final  Blood culture (routine x 2)     Status: None   Collection Time: 05/18/15  8:01 PM  Result Value Ref Range Status   Specimen Description BLOOD LEFT HAND  Final   Special Requests BOTTLES DRAWN AEROBIC AND ANAEROBIC 3CC  Final   Culture NO GROWTH 5 DAYS  Final   Report  Status 05/23/2015 FINAL  Final  Anaerobic culture     Status: None   Collection Time: 05/21/15  2:07 PM  Result Value Ref Range Status   Specimen Description WOUND  Final   Special Requests NONE  Final   Culture NO ANAEROBES ISOLATED  Final   Report Status 05/25/2015 FINAL  Final  Wound culture     Status: None   Collection Time: 05/21/15  2:07 PM  Result Value Ref Range Status   Specimen Description WOUND  Final   Special Requests NONE  Final   Gram Stain RARE WBC SEEN RARE GRAM NEGATIVE COCCI   Final   Culture NO GROWTH 3 DAYS  Final   Report Status 05/24/2015 FINAL  Final    Management plans discussed with the patient, family and they are in agreement.  CODE STATUS:   TOTAL TIME TAKING CARE OF THIS PATIENT: 55 minutes.    [facility], Travis Purk M.D on 05/26/2015 at 10:17 PM  Between 7am to 6pm - Pager - [phone]  After 6pm go to [email or web address] - password [email or web address] [facility]  [place] [facility]  Office  [phone]  CC: Primary care physician; Corky Downs, MD Clydie Braun, MD Iran Ouch, MD Linus Galas, MD

## 2015-05-26 NOTE — Care Management (Signed)
DC summary is being requested by Lucienne Capers with workmens comp.  Informed would fax document when it has been entered.  Page dischagring MD

## 2015-05-27 NOTE — Care Management (Signed)
Faxed discharge summary to [facility].  REceived confirmation of fax

## 2015-06-02 ENCOUNTER — Telehealth: Payer: Self-pay

## 2015-06-02 NOTE — Telephone Encounter (Signed)
Rehab Nurse calling to confirm an appt.  Patient does not have appt in this clinic.  Told rn to try PCP office

## 2015-06-28 ENCOUNTER — Encounter: Payer: Self-pay | Admitting: Emergency Medicine

## 2015-06-28 ENCOUNTER — Inpatient Hospital Stay
Admission: EM | Admit: 2015-06-28 | Discharge: 2015-07-01 | DRG: 378 | Disposition: A | Discharge status: Home / Self Care | Payer: Self-pay | Attending: Internal Medicine | Admitting: Internal Medicine

## 2015-06-28 DIAGNOSIS — K920 Hematemesis: Principal | ICD-10-CM | POA: Diagnosis present

## 2015-06-28 DIAGNOSIS — Z882 Allergy status to sulfonamides status: Secondary | ICD-10-CM

## 2015-06-28 DIAGNOSIS — Z86718 Personal history of other venous thrombosis and embolism: Secondary | ICD-10-CM

## 2015-06-28 DIAGNOSIS — K210 Gastro-esophageal reflux disease with esophagitis: Secondary | ICD-10-CM | POA: Diagnosis present

## 2015-06-28 DIAGNOSIS — R52 Pain, unspecified: Secondary | ICD-10-CM

## 2015-06-28 DIAGNOSIS — K2210 Ulcer of esophagus without bleeding: Secondary | ICD-10-CM | POA: Diagnosis present

## 2015-06-28 DIAGNOSIS — I248 Other forms of acute ischemic heart disease: Secondary | ICD-10-CM | POA: Diagnosis present

## 2015-06-28 DIAGNOSIS — E119 Type 2 diabetes mellitus without complications: Secondary | ICD-10-CM | POA: Diagnosis present

## 2015-06-28 DIAGNOSIS — K319 Disease of stomach and duodenum, unspecified: Secondary | ICD-10-CM | POA: Diagnosis present

## 2015-06-28 DIAGNOSIS — Z8249 Family history of ischemic heart disease and other diseases of the circulatory system: Secondary | ICD-10-CM

## 2015-06-28 DIAGNOSIS — Z9889 Other specified postprocedural states: Secondary | ICD-10-CM

## 2015-06-28 DIAGNOSIS — Z7982 Long term (current) use of aspirin: Secondary | ICD-10-CM

## 2015-06-28 DIAGNOSIS — E869 Volume depletion, unspecified: Secondary | ICD-10-CM | POA: Diagnosis present

## 2015-06-28 DIAGNOSIS — I82442 Acute embolism and thrombosis of left tibial vein: Secondary | ICD-10-CM | POA: Diagnosis present

## 2015-06-28 DIAGNOSIS — L409 Psoriasis, unspecified: Secondary | ICD-10-CM | POA: Diagnosis present

## 2015-06-28 DIAGNOSIS — Z803 Family history of malignant neoplasm of breast: Secondary | ICD-10-CM

## 2015-06-28 DIAGNOSIS — Z801 Family history of malignant neoplasm of trachea, bronchus and lung: Secondary | ICD-10-CM

## 2015-06-28 DIAGNOSIS — Z794 Long term (current) use of insulin: Secondary | ICD-10-CM

## 2015-06-28 DIAGNOSIS — N183 Chronic kidney disease, stage 3 (moderate): Secondary | ICD-10-CM | POA: Diagnosis present

## 2015-06-28 DIAGNOSIS — M869 Osteomyelitis, unspecified: Secondary | ICD-10-CM | POA: Diagnosis present

## 2015-06-28 DIAGNOSIS — Z79899 Other long term (current) drug therapy: Secondary | ICD-10-CM

## 2015-06-28 DIAGNOSIS — B379 Candidiasis, unspecified: Secondary | ICD-10-CM | POA: Diagnosis present

## 2015-06-28 DIAGNOSIS — I129 Hypertensive chronic kidney disease with stage 1 through stage 4 chronic kidney disease, or unspecified chronic kidney disease: Secondary | ICD-10-CM | POA: Diagnosis present

## 2015-06-28 DIAGNOSIS — K922 Gastrointestinal hemorrhage, unspecified: Secondary | ICD-10-CM | POA: Diagnosis present

## 2015-06-28 DIAGNOSIS — Z800 Family history of malignant neoplasm of digestive organs: Secondary | ICD-10-CM

## 2015-06-28 LAB — COMPREHENSIVE METABOLIC PANEL
ALBUMIN: 4.2 g/dL (ref 3.5–5.0)
ALK PHOS: 85 U/L (ref 38–126)
ALT: 23 U/L (ref 17–63)
ANION GAP: 13 (ref 5–15)
AST: 36 U/L (ref 15–41)
BILIRUBIN TOTAL: 1.4 mg/dL — AB (ref 0.3–1.2)
BUN: 36 mg/dL — ABNORMAL HIGH (ref 6–20)
CALCIUM: 10.1 mg/dL (ref 8.9–10.3)
CO2: 21 mmol/L — ABNORMAL LOW (ref 22–32)
Chloride: 110 mmol/L (ref 101–111)
Creatinine, Ser: 1.96 mg/dL — ABNORMAL HIGH (ref 0.61–1.24)
GFR, EST AFRICAN AMERICAN: 45 mL/min — AB (ref >60)
GFR, EST NON AFRICAN AMERICAN: 39 mL/min — AB (ref >60)
GLUCOSE: 164 mg/dL — AB (ref 65–99)
Potassium: 3.5 mmol/L (ref 3.5–5.1)
Sodium: 144 mmol/L (ref 135–145)
TOTAL PROTEIN: 7.9 g/dL (ref 6.5–8.1)

## 2015-06-28 LAB — CBC WITH DIFFERENTIAL/PLATELET
BASOS ABS: 0 10*3/uL (ref 0–0.1)
BASOS PCT: 0 %
Eosinophils Absolute: 0 10*3/uL (ref 0–0.7)
Eosinophils Relative: 0 %
HEMATOCRIT: 34.7 % — AB (ref 40.0–52.0)
Hemoglobin: 11 g/dL — ABNORMAL LOW (ref 13.0–18.0)
Lymphocytes Relative: 11 %
Lymphs Abs: 1.7 10*3/uL (ref 1.0–3.6)
MCH: 24.7 pg — ABNORMAL LOW (ref 26.0–34.0)
MCHC: 31.7 g/dL — ABNORMAL LOW (ref 32.0–36.0)
MCV: 78 fL — ABNORMAL LOW (ref 80.0–100.0)
MONO ABS: 0.5 10*3/uL (ref 0.2–1.0)
Monocytes Relative: 3 %
NEUTROS ABS: 13.4 10*3/uL — AB (ref 1.4–6.5)
Neutrophils Relative %: 86 %
PLATELETS: 213 10*3/uL (ref 150–440)
RBC: 4.45 MIL/uL (ref 4.40–5.90)
RDW: 17.5 % — AB (ref 11.5–14.5)
WBC: 15.6 10*3/uL — AB (ref 3.8–10.6)

## 2015-06-28 LAB — ABO/RH: ABO/RH(D): A POS

## 2015-06-28 LAB — TYPE AND SCREEN
ABO/RH(D): A POS
ANTIBODY SCREEN: NEGATIVE

## 2015-06-28 LAB — TROPONIN I: Troponin I: 0.08 ng/mL — ABNORMAL HIGH (ref <0.031)

## 2015-06-28 LAB — LIPASE, BLOOD: Lipase: 20 U/L — ABNORMAL LOW (ref 22–51)

## 2015-06-28 MED ORDER — SODIUM CHLORIDE 0.9 % IV BOLUS (SEPSIS)
1000.0000 mL | Freq: Once | INTRAVENOUS | Status: AC
  Administered 2015-06-28: 1000 mL via INTRAVENOUS

## 2015-06-28 MED ORDER — HYDROMORPHONE HCL 1 MG/ML IJ SOLN
1.0000 mg | Freq: Once | INTRAMUSCULAR | Status: AC
Start: 2015-06-28 — End: 2015-06-28
  Administered 2015-06-28: 1 mg via INTRAVENOUS
  Filled 2015-06-28: qty 1

## 2015-06-28 MED ORDER — PROMETHAZINE HCL 25 MG/ML IJ SOLN
12.5000 mg | Freq: Once | INTRAMUSCULAR | Status: AC
  Administered 2015-06-28: 12.5 mg via INTRAVENOUS
  Filled 2015-06-28: qty 1

## 2015-06-28 MED ORDER — ONDANSETRON HCL 4 MG/2ML IJ SOLN
4.0000 mg | Freq: Once | INTRAMUSCULAR | Status: AC
  Administered 2015-06-28: 4 mg via INTRAVENOUS

## 2015-06-28 MED ORDER — ONDANSETRON HCL 4 MG/2ML IJ SOLN
INTRAMUSCULAR | Status: AC
Start: 2015-06-28 — End: 2015-06-28
  Administered 2015-06-28: 4 mg via INTRAVENOUS
  Filled 2015-06-28: qty 2

## 2015-06-28 MED ORDER — PANTOPRAZOLE SODIUM 40 MG IV SOLR
40.0000 mg | Freq: Once | INTRAVENOUS | Status: AC
  Administered 2015-06-28: 40 mg via INTRAVENOUS
  Filled 2015-06-28: qty 40

## 2015-06-28 MED ORDER — GI COCKTAIL ~~LOC~~
30.0000 mL | Freq: Once | ORAL | Status: AC
  Administered 2015-06-28: 30 mL via ORAL
  Filled 2015-06-28: qty 30

## 2015-06-28 MED ORDER — ONDANSETRON HCL 4 MG/2ML IJ SOLN
4.0000 mg | Freq: Once | INTRAMUSCULAR | Status: AC
  Administered 2015-06-28: 4 mg via INTRAVENOUS
  Filled 2015-06-28: qty 2

## 2015-06-28 NOTE — ED Notes (Signed)
Patient actively vomiting, MD notified, see MAR for verbal order.

## 2015-06-28 NOTE — ED Notes (Signed)
Patient presents to ED actively vomiting, reports had surgery to left foot post infection from injury, has history of diabetes, and is currently taking doxycyline and Cipro. Patient report has been vomiting since after surgery usually every 2-3 days, reports has been vomiting since 6:30 this morning and has not been getting worse. Patient has history of HTN, took medicine this morning around 7:30 but believes he was not able to keep it down. Patient diaphoretic upon assessment, denies fevers at home. C/o chest pian, sore throat, and upper abdominal pain. Emesis coffee ground in appearance, reports unable to keep anything down. Patient alert and oriented x 4.

## 2015-06-28 NOTE — ED Notes (Signed)
CBG 159, MD notified.

## 2015-06-28 NOTE — ED Notes (Signed)
Pt to triage via w/c actively vomiting; reports surgery to left foot post infection from injury (hx diabetes)--Dr Graciela Husbands: PICC line was in place for 5wks, removed wk ago; currently taking doxycyline and cipro; vomiting since this morning

## 2015-06-28 NOTE — ED Notes (Signed)
Dr. Quigley at bedside.  

## 2015-06-28 NOTE — ED Provider Notes (Signed)
Time Seen: Approximately 2100 I have reviewed the triage notes  Chief Complaint: Emesis   History of Present Illness: Juan Roberson. is a 44 y.o. male who has a history of multiple medical problems currently under treatment for osteomyelitis. Patient had been on IV antibiotics and recently had a PICC line removed. The patient's currently taking doxycycline and ciprofloxacin. Patient had acute onset of nausea vomiting today that started at 6:30 this morning is been rather persistent all day. His blood sugars have been running normal at home. He denies any fever or chills. He states initially when he started vomiting was mostly bright red blood now with coffee ground emesis. He denies any lower abdominal pain and points mainly to the chest and states it feels burning sensation usually associated with vomiting. Past Medical History  Diagnosis Date  . Diabetes mellitus without complication     a. Dx ~ 1996.  . Essential hypertension   . CKD (chronic kidney disease), stage III   . GERD (gastroesophageal reflux disease)   . Cellulitis and abscess of foot     Left-Dr. Ether Griffins  . Osteomyelitis     a. 05/2015 L foot.  . Left leg DVT     a. Dx 05/2015 -> Coumadin.  . Gastritis     a. 04/2015 hematemesis -> EGD: gastritis, esophagitis, duodenitis.  No active bleeding.  PPI added.    Patient Active Problem List   Diagnosis Date Noted  . Abnormal finding on EKG   . Osteomyelitis of ankle or foot, acute 05/18/2015  . Foot abscess, left 05/18/2015  . Hematemesis with nausea   . Gastritis   . Acute esophagitis   . Duodenitis   . Cellulitis 04/05/2015    Past Surgical History  Procedure Laterality Date  . No past surgeries    . I&d extremity Left 04/06/2015    Procedure: IRRIGATION AND DEBRIDEMENT EXTREMITY;  Surgeon: Gwyneth Revels, DPM;  Location: [facility] ORS;  Service: Podiatry;  Laterality: Left;  . Esophagogastroduodenoscopy N/A 04/07/2015    Procedure: ESOPHAGOGASTRODUODENOSCOPY (EGD);   Surgeon: Midge Minium, MD;  Location: [facility] ENDOSCOPY;  Service: Endoscopy;  Laterality: N/A;  . Foot surgery    . Irrigation and debridement foot Left 05/21/2015    Procedure: IRRIGATION AND DEBRIDEMENT FOOT;  Surgeon: Linus Galas, MD;  Location: [facility] ORS;  Service: Podiatry;  Laterality: Left;    Past Surgical History  Procedure Laterality Date  . No past surgeries    . I&d extremity Left 04/06/2015    Procedure: IRRIGATION AND DEBRIDEMENT EXTREMITY;  Surgeon: Gwyneth Revels, DPM;  Location: [facility] ORS;  Service: Podiatry;  Laterality: Left;  . Esophagogastroduodenoscopy N/A 04/07/2015    Procedure: ESOPHAGOGASTRODUODENOSCOPY (EGD);  Surgeon: Midge Minium, MD;  Location: [facility] ENDOSCOPY;  Service: Endoscopy;  Laterality: N/A;  . Foot surgery    . Irrigation and debridement foot Left 05/21/2015    Procedure: IRRIGATION AND DEBRIDEMENT FOOT;  Surgeon: Linus Galas, MD;  Location: [facility] ORS;  Service: Podiatry;  Laterality: Left;    Current Outpatient Rx  Name  Route  Sig  Dispense  Refill  . albuterol (PROVENTIL) (2.5 MG/3ML) 0.083% nebulizer solution   Nebulization   Take 3 mLs (2.5 mg total) by nebulization every 2 (two) hours as needed for wheezing.   75 mL   12   . amLODipine (NORVASC) 10 MG tablet   Oral   Take 1 tablet (10 mg total) by mouth daily. Patient not taking: Reported on 05/18/2015   30 tablet  0   . aspirin EC 325 MG EC tablet   Oral   Take 1 tablet (325 mg total) by mouth daily.   30 tablet   0   . atorvastatin (LIPITOR) 80 MG tablet   Oral   Take 1 tablet (80 mg total) by mouth daily at 6 PM.   30 tablet   3   . carvedilol (COREG) 25 MG tablet   Oral   Take 50 mg by mouth 2 (two) times daily.      0   . cloNIDine (CATAPRES) 0.2 MG tablet   Oral   Take 0.2 mg by mouth daily.      4   . DAPTOmycin 500 mg in sodium chloride 0.9 % 100 mL   Intravenous   Inject 500 mg into the vein daily.   28 Dose   0   . DULoxetine (CYMBALTA) 30 MG capsule   Oral   Take  30 mg by mouth 2 (two) times daily.      6   . furosemide (LASIX) 40 MG tablet   Oral   Take 40 mg by mouth daily.         [name] gabapentin (NEURONTIN) 300 MG capsule   Oral   Take 300 mg by mouth daily.      4   . glyBURIDE (DIABETA) 5 MG tablet            5   . hydrALAZINE (APRESOLINE) 100 MG tablet   Oral   Take 100 mg by mouth 3 (three) times daily.      2   . HYDROcodone-acetaminophen (NORCO) 7.5-325 MG per tablet   Oral   Take 1 tablet by mouth 2 (two) times daily.      0   . insulin aspart (NOVOLOG) 100 UNIT/ML injection   Subcutaneous   Inject 0-9 Units into the skin 3 (three) times daily with meals.   10 mL   11   . IODOSORB 0.9 % gel   Topical   Apply 1 application topically daily as needed.      0     Dispense as written.   [name] LYRICA 75 MG capsule   Oral   Take 75 mg by mouth 3 (three) times daily.      0     Dispense as written.   . ondansetron (ZOFRAN) 4 MG tablet   Oral   Take 1 tablet by mouth as needed.         [name] oxyCODONE (OXY IR/ROXICODONE) 5 MG immediate release tablet   Oral   Take 1 tablet (5 mg total) by mouth every 4 (four) hours as needed for moderate pain.   30 tablet   0   . pantoprazole (PROTONIX) 40 MG tablet   Oral   Take 1 tablet (40 mg total) by mouth daily.   30 tablet   0   . patiromer (VELTASSA) 8.4 G packet   Oral   Take 8.4 g by mouth daily.         [name] senna-docusate (SENOKOT-S) 8.6-50 MG per tablet   Oral   Take 2 tablets by mouth 2 (two) times daily.   60 tablet   2   . topiramate (TOPAMAX) 25 MG tablet   Oral   Take 25 mg by mouth 2 (two) times daily.      3   . traMADol (ULTRAM) 50 MG tablet   Oral   Take 1 tablet by mouth every  4 (four) hours as needed.         . TRESIBA FLEXTOUCH 200 UNIT/ML SOPN   Subcutaneous   Inject 16 Units into the skin daily.      5     Dispense as written.     Allergies:  Sulfur  Family History: Family History  Problem Relation Age of Onset  .  Coronary artery disease Father   . Pancreatic cancer Mother   . Breast cancer Sister   . Lung cancer Brother   . Pancreatic cancer Mother     Social History: Social History  Substance Use Topics  . Smoking status: Never Smoker   . Smokeless tobacco: None  . Alcohol Use: No     Review of Systems:   10 point review of systems was performed and was otherwise negative:  Constitutional: No fever Eyes: No visual disturbances ENT: He states his sore throat after vomiting. Cardiac: Burning substernal chest discomfort Respiratory: No shortness of breath, wheezing, or stridor Abdomen: Epigastric pain without radiation to the back or flank area, persistent ending, No diarrhea Endocrine: No weight loss, No night sweats Extremities: No peripheral edema, cyanosis Skin: No rashes, easy bruising healing osteomyelitis Neurologic: No focal weakness, trouble with speech or swollowing Urologic: No dysuria, Hematuria, or urinary frequency   Physical Exam:  ED Triage Vitals  Enc Vitals Group     BP 06/28/15 2028 208/100 mmHg     Pulse Rate 06/28/15 2028 126     Resp 06/28/15 2028 20     Temp 06/28/15 2028 98.6 F (37 C)     Temp Source 06/28/15 2028 Oral     SpO2 06/28/15 2028 97 %     Weight 06/28/15 2028 193 lb (87.544 kg)     Height 06/28/15 2028 6\' 2"  (1.88 m)     Head Cir --      Peak Flow --      Pain Score 06/28/15 2028 10     Pain Loc --      Pain Edu? --      Excl. in GC? --     General: Awake , Alert , and Oriented times 3; GCS 15 Head: Normal cephalic , atraumatic Eyes: Pupils equal , round, reactive to light Nose/Throat: No nasal drainage, patent upper airway without erythema or exudate.  Neck: Supple, Full range of motion, No anterior adenopathy or palpable thyroid masses Lungs: Clear to ascultation without wheezes , rhonchi, or rales Heart tachycardia  without murmurs , gallops , or rubs Abdomen: Soft, tender in the epigastric area without guarding , or rigidity;  bowel sounds positive and symmetric in all 4 quadrants. No organomegaly .        Extremities: 2 plus symmetric pulses. No edema, clubbing or cyanosis Neurologic: normal ambulation, Motor symmetric without deficits, sensory intact Skin: warm, dry, no rashes   Labs:   All laboratory work was reviewed including any pertinent negatives or positives listed below:  Labs Reviewed  COMPREHENSIVE METABOLIC PANEL  CBC WITH DIFFERENTIAL/PLATELET  LIPASE, BLOOD  TYPE AND SCREEN    EKG:  ED ECG REPORT I, Jennye Moccasin, the attending physician, personally viewed and interpreted this ECG.  Date: 06/28/2015 EKG Time: 2129 Rate: 120 Rhythm: Sinus tachycardia with left atrial enlargement QRS Axis: Left axis deviation Intervals: normal ST/T Wave abnormalities: Nonspecific ST and T wave abnormality Conduction Disutrbances: none Narrative Interpretation: No acute ischemic changes   Critical Care:  CRITICAL CARE Performed by: Jennye Moccasin   Total  critical care time: 33 minutes  Critical care time was exclusive of separately billable procedures and treating other patients.  Critical care was necessary to treat or prevent imminent or life-threatening deterioration.  Critical care was time spent personally by me on the following activities: development of treatment plan with patient and/or surrogate as well as nursing, discussions with consultants, evaluation of patient's response to treatment, examination of patient, obtaining history from patient or surrogate, ordering and performing treatments and interventions, ordering and review of laboratory studies, ordering and review of radiographic studies, pulse oximetry and re-evaluation of patient's condition. Evaluation and treatment of acute upper gastrointestinal bleed    ED Course:  Patient had IV established and lacking his emesis which appeared coffee-ground like showed it to be guaiac positive. This bleeding may be secondary to  doxycycline. Patient was started on proton pump inhibitor therapy and was given multiple doses of anti-medic therapy and pain control. He remains hemodynamically stable and in fact his blood pressure was running elevated and his heart rate is decreased gradually with fluids. Patient denies any significant abdominal pain that felt this was unlikely to be a perforated ulcer at this time.   Assessment:  Acute upper gastrointestinal bleed   Final Clinical Impression acute upper gastrointestinal bleed Final diagnoses:  None     Plan:  Inpatient management I spoke to the hospitalist team, further disposition and management depends upon their evaluation.            Jennye Moccasin, MD 06/28/15 [phone]

## 2015-06-29 ENCOUNTER — Inpatient Hospital Stay: Payer: Self-pay

## 2015-06-29 DIAGNOSIS — K922 Gastrointestinal hemorrhage, unspecified: Secondary | ICD-10-CM | POA: Diagnosis present

## 2015-06-29 LAB — GLUCOSE, CAPILLARY
Glucose-Capillary: 159 mg/dL — ABNORMAL HIGH (ref 65–99)
Glucose-Capillary: 126 mg/dL — ABNORMAL HIGH (ref 65–99)
Glucose-Capillary: 85 mg/dL (ref 65–99)
Glucose-Capillary: 67 mg/dL (ref 65–99)
Glucose-Capillary: 307 mg/dL — ABNORMAL HIGH (ref 65–99)

## 2015-06-29 LAB — CBC
HCT: 29.2 % — ABNORMAL LOW (ref 40.0–52.0)
HEMOGLOBIN: 9.2 g/dL — AB (ref 13.0–18.0)
MCH: 24.2 pg — ABNORMAL LOW (ref 26.0–34.0)
MCHC: 31.4 g/dL — AB (ref 32.0–36.0)
MCV: 77.1 fL — ABNORMAL LOW (ref 80.0–100.0)
Platelets: 135 10*3/uL — ABNORMAL LOW (ref 150–440)
RBC: 3.79 MIL/uL — AB (ref 4.40–5.90)
RDW: 17.6 % — ABNORMAL HIGH (ref 11.5–14.5)
WBC: 8.8 10*3/uL (ref 3.8–10.6)

## 2015-06-29 LAB — TROPONIN I
Troponin I: 0.22 ng/mL — ABNORMAL HIGH (ref <0.031)
Troponin I: 0.24 ng/mL — ABNORMAL HIGH (ref <0.031)

## 2015-06-29 LAB — BASIC METABOLIC PANEL
Anion gap: 7 (ref 5–15)
BUN: 38 mg/dL — ABNORMAL HIGH (ref 6–20)
CHLORIDE: 116 mmol/L — AB (ref 101–111)
CO2: 24 mmol/L (ref 22–32)
CREATININE: 1.91 mg/dL — AB (ref 0.61–1.24)
Calcium: 8.8 mg/dL — ABNORMAL LOW (ref 8.9–10.3)
GFR calc non Af Amer: 40 mL/min — ABNORMAL LOW (ref >60)
GFR, EST AFRICAN AMERICAN: 47 mL/min — AB (ref >60)
Glucose, Bld: 111 mg/dL — ABNORMAL HIGH (ref 65–99)
Potassium: 3.8 mmol/L (ref 3.5–5.1)
SODIUM: 147 mmol/L — AB (ref 135–145)

## 2015-06-29 LAB — HEMOGLOBIN A1C: Hgb A1c MFr Bld: 6.8 % — ABNORMAL HIGH (ref 4.0–6.0)

## 2015-06-29 MED ORDER — PANTOPRAZOLE SODIUM 40 MG IV SOLR
40.0000 mg | Freq: Two times a day (BID) | INTRAVENOUS | Status: DC
  Administered 2015-06-29 – 2015-06-30 (×3): 40 mg via INTRAVENOUS
  Filled 2015-06-29 (×3): qty 40

## 2015-06-29 MED ORDER — HYDRALAZINE HCL 20 MG/ML IJ SOLN
10.0000 mg | INTRAMUSCULAR | Status: DC | PRN
  Administered 2015-06-30: 10 mg via INTRAVENOUS
  Filled 2015-06-29: qty 1

## 2015-06-29 MED ORDER — INSULIN GLARGINE 100 UNIT/ML ~~LOC~~ SOLN
18.0000 [IU] | Freq: Every day | SUBCUTANEOUS | Status: DC
  Administered 2015-06-29 – 2015-06-30 (×2): 18 [IU] via SUBCUTANEOUS
  Filled 2015-06-29 (×3): qty 0.18

## 2015-06-29 MED ORDER — ONDANSETRON HCL 4 MG PO TABS: 4.0000 mg | ORAL_TABLET | Freq: Four times a day (QID) | ORAL | Status: DC | PRN

## 2015-06-29 MED ORDER — MORPHINE SULFATE (PF) 2 MG/ML IV SOLN
2.0000 mg | INTRAVENOUS | Status: DC | PRN
  Administered 2015-06-29: 2 mg via INTRAVENOUS
  Filled 2015-06-29 (×2): qty 1

## 2015-06-29 MED ORDER — PIPERACILLIN-TAZOBACTAM 3.375 G IVPB
3.3750 g | Freq: Three times a day (TID) | INTRAVENOUS | Status: DC
  Administered 2015-06-29 (×2): 3.375 g via INTRAVENOUS
  Filled 2015-06-29 (×5): qty 50

## 2015-06-29 MED ORDER — ONDANSETRON HCL 4 MG/2ML IJ SOLN
4.0000 mg | INTRAMUSCULAR | Status: DC | PRN
  Administered 2015-06-29 – 2015-06-30 (×2): 4 mg via INTRAVENOUS
  Filled 2015-06-29 (×2): qty 2

## 2015-06-29 MED ORDER — INSULIN ASPART 100 UNIT/ML ~~LOC~~ SOLN
0.0000 [IU] | Freq: Three times a day (TID) | SUBCUTANEOUS | Status: DC
  Administered 2015-06-29: 2 [IU] via SUBCUTANEOUS
  Administered 2015-06-30: 3 [IU] via SUBCUTANEOUS
  Filled 2015-06-29: qty 3
  Filled 2015-06-29: qty 2

## 2015-06-29 MED ORDER — SODIUM CHLORIDE 0.9 % IJ SOLN
3.0000 mL | Freq: Two times a day (BID) | INTRAMUSCULAR | Status: DC
  Administered 2015-06-29: 3 mL via INTRAVENOUS

## 2015-06-29 MED ORDER — PROMETHAZINE HCL 25 MG/ML IJ SOLN
25.0000 mg | Freq: Once | INTRAMUSCULAR | Status: AC
  Administered 2015-06-29: 25 mg via INTRAVENOUS
  Filled 2015-06-29: qty 1

## 2015-06-29 MED ORDER — LABETALOL HCL 5 MG/ML IV SOLN
10.0000 mg | Freq: Once | INTRAVENOUS | Status: AC
  Administered 2015-06-29: 10 mg via INTRAVENOUS
  Filled 2015-06-29: qty 4

## 2015-06-29 MED ORDER — HYDRALAZINE HCL 20 MG/ML IJ SOLN
10.0000 mg | Freq: Four times a day (QID) | INTRAMUSCULAR | Status: DC | PRN
  Administered 2015-06-29: 10 mg via INTRAVENOUS
  Filled 2015-06-29: qty 1

## 2015-06-29 MED ORDER — PROMETHAZINE HCL 25 MG/ML IJ SOLN
12.5000 mg | INTRAMUSCULAR | Status: DC | PRN
  Administered 2015-06-29 – 2015-06-30 (×2): 12.5 mg via INTRAVENOUS
  Filled 2015-06-29 (×2): qty 1

## 2015-06-29 MED ORDER — METOPROLOL TARTRATE 50 MG PO TABS
50.0000 mg | ORAL_TABLET | Freq: Two times a day (BID) | ORAL | Status: DC
  Administered 2015-06-29 – 2015-07-01 (×4): 50 mg via ORAL
  Filled 2015-06-29 (×5): qty 1

## 2015-06-29 MED ORDER — ACETAMINOPHEN 325 MG PO TABS
650.0000 mg | ORAL_TABLET | Freq: Four times a day (QID) | ORAL | Status: DC | PRN
  Administered 2015-06-29: 650 mg via ORAL
  Filled 2015-06-29: qty 2

## 2015-06-29 MED ORDER — SODIUM CHLORIDE 0.9 % IV SOLN
INTRAVENOUS | Status: DC
  Administered 2015-06-29 – 2015-07-01 (×8): via INTRAVENOUS

## 2015-06-29 MED ORDER — ONDANSETRON HCL 4 MG/2ML IJ SOLN
4.0000 mg | Freq: Four times a day (QID) | INTRAMUSCULAR | Status: DC | PRN
  Administered 2015-06-29: 4 mg via INTRAVENOUS
  Filled 2015-06-29: qty 2

## 2015-06-29 MED ORDER — CIPROFLOXACIN HCL 500 MG PO TABS
500.0000 mg | ORAL_TABLET | Freq: Two times a day (BID) | ORAL | Status: DC
  Administered 2015-06-29 – 2015-07-01 (×3): 500 mg via ORAL
  Filled 2015-06-29 (×3): qty 1

## 2015-06-29 MED ORDER — DOXYCYCLINE HYCLATE 100 MG PO TABS
100.0000 mg | ORAL_TABLET | Freq: Two times a day (BID) | ORAL | Status: DC
  Administered 2015-06-29 – 2015-07-01 (×4): 100 mg via ORAL
  Filled 2015-06-29 (×4): qty 1

## 2015-06-29 NOTE — Progress Notes (Signed)
Initial Nutrition Assessment   INTERVENTION:   Medical Food Supplement Therapy: will recommend if diet order unable to be advanced from CL Coordination of Care: await diet order progression as medically able   NUTRITION DIAGNOSIS:   Inadequate oral intake related to inability to eat as evidenced by NPO status.  GOAL:   Patient will meet greater than or equal to 90% of their needs  MONITOR:    (Energy Intake, Digestive system, Electrolyte and Renal Profile)  REASON FOR ASSESSMENT:   Malnutrition Screening Tool    ASSESSMENT:   Pt admitted with coffee ground emesis and sepsis with GI bleed. Pt on isolation for MRSA.  Past Medical History  Diagnosis Date  . Diabetes mellitus without complication     a. Dx ~ 1996.  . Essential hypertension   . CKD (chronic kidney disease), stage III   . GERD (gastroesophageal reflux disease)   . Cellulitis and abscess of foot     Left-Dr. Ether Griffins  . Osteomyelitis     a. 05/2015 L foot.  . Left leg DVT     a. Dx 05/2015 -> Coumadin.  . Gastritis     a. 04/2015 hematemesis -> EGD: gastritis, esophagitis, duodenitis.  No active bleeding.  PPI added.    Diet Order:  Diet clear liquid Room service appropriate?: Yes; Fluid consistency:: Thin    Current Nutrition: Pt reports being very hungry on visit, had been NPO since admission on visit  Food/Nutrition-Related History: Pt reports not eating for 2 days PTA secondary to n/v. Pt reports prior to that having a good appetite.    Medications: NS at 147mL/hr, Novolog, Lantus, Protonix  Electrolyte/Renal Profile and Glucose Profile:   Recent Labs Lab 06/28/15 2046 06/29/15 1035  NA 144 147*  K 3.5 3.8  CL 110 116*  CO2 21* 24  BUN 36* 38*  CREATININE 1.96* 1.91*  CALCIUM 10.1 8.8*  GLUCOSE 164* 111*   Protein Profile:  Recent Labs Lab 06/28/15 2046  ALBUMIN 4.2    Gastrointestinal Profile: Last BM:  06/29/2015   Nutrition-Focused Physical Exam Findings:  Unable to  complete Nutrition-Focused physical exam at this time.    Weight Change: Pt reports UBW of 193lbs but that here it was 181lbs ? Per [facility] weight loss of 7% in one month.  Height:   Ht Readings from Last 1 Encounters:  06/28/15  (1.88 m)    Weight:   Wt Readings from Last 1 Encounters:  06/28/15 193 lb (87.544 kg)   Wt Readings from Last 10 Encounters:  06/28/15 193 lb (87.544 kg)  05/25/15 208 lb 3.2 oz (94.439 kg)  05/18/15 200 lb (90.719 kg)  04/07/15 215 lb 6.4 oz (97.705 kg)  04/02/15 220 lb (99.791 kg)     BMI:  Body mass index is 24.77 kg/(m^2).  Estimated Nutritional Needs:   Kcal:  BEE: 1818kcals, TEE: (IF 1.0-1.2)(AF 1.3) [id number]  Protein:  70-88g protein (0.8-1.0g/kg)   Fluid:  2188-2630mL of fluid (25-43mL/kg)  EDUCATION NEEDS:   No education needs identified at this time    MODERATE Care Level  Leda Quail, RD, LDN Pager [phone]

## 2015-06-29 NOTE — Progress Notes (Signed)
Called Dr. Clint Guy about tylenol for patient.  Orders were put in.  Arturo Morton  06/29/2015 10:35 PM

## 2015-06-29 NOTE — Progress Notes (Signed)
Notified Dr Nemiah Commander of results of pt ultrasound; Dr acknowledged, stated to order vascular consult; no additional orders

## 2015-06-29 NOTE — Progress Notes (Signed)
Called Dr. Clint Guy regarding patient request for more nausea medication because zofran did not help.  Doctor ordered phenergan 12.5 mg q4 prn.  Lennon Alstrom N  06/30/2015  12:01 AM

## 2015-06-29 NOTE — Consult Note (Signed)
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 Patient with diabetes and LOPS. Seen by outpatient wound care center (Dr. Graciela Husbands) twice monthly; [facility] sees [facility] for wound care. 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 Graciela Husbands (hydrogel), but increase frequency to once daily while here in acute care.  Orders are provided for Nursing staff as well as item numbers for the obtaining of necessary supplies. WOC nursing team will not follow, but will remain available to this patient, the nursing and medical teams.  Please re-consult if needed. Thanks, Ladona Mow, MSN, RN, GNP, [place], CWON-AP [phone])

## 2015-06-29 NOTE — ED Notes (Signed)
Dr Diamond at bedside. 

## 2015-06-29 NOTE — Consult Note (Signed)
GI Inpatient Consult Note  Reason for Consult:  Coffee ground emesis   Attending Requesting Consult: Dr. Sheryle Hail  History of Present Illness: Juan Roberson. is a 44 y.o. male who reports he started vomiting yesterday morning around 6:30 a.m. Continuing all day until around 3:00 a.m. this morning.  He reports the first 10 episodes were just fluid, and then he started vomiting blood.  He reports feeling nauseous, denies abdominal pain, diarrhea.  He denies vomiting undigested food.  He denies having any suspicious foods denies sick contacts  He was previously admitted at [facility] at the beginning of July with 3 episodes of bloody emesis.  He  Had an upper endoscopy on April 07, 2015.   Findings ;  LA grade B esophagitis with no bleeding was found in the lower third of the esophagus.  Localize moderate inflammation characterized by erosions was found in the gastric antrum.  Biopsies were taken with a cold forceps for histology.  Localized mild inflammation characterized by erythema was found in duodenal bulb.  Unable to locate biopsy results.  Patient was discharged from the hospital on Protonix 40 mg once daily.  He was hospitalized again in August, 2016, but for a left foot abscess as well as osteomyelitis of his third toe.  At that time the records indicate he was still taking Protonix.  Patient reports that he has not taken Protonix in "a while".  He reports having rare heartburn and acid reflux symptoms.  Denies any epigastric pain. He was recently started on doxycycline 100 mg b.i.d., approximately 1 week ago.  He denies any NSAID use other than 81 mg aspirin daily.  He reports that he is feeling much better at this time and is anxious to have something to eat and drink.  Past Medical History:  Past Medical History  Diagnosis Date  . Diabetes mellitus without complication     a. Dx ~ 1996.  . Essential hypertension   . CKD (chronic kidney disease), stage III   .  GERD (gastroesophageal reflux disease)   . Cellulitis and abscess of foot     Left-Dr. Ether Griffins  . Osteomyelitis     a. 05/2015 L foot.  . Left leg DVT     a. Dx 05/2015 -> Coumadin.  . Gastritis     a. 04/2015 hematemesis -> EGD: gastritis, esophagitis, duodenitis.  No active bleeding.  PPI added.    Problem List: Patient Active Problem List   Diagnosis Date Noted  . GI bleed 06/29/2015  . Abnormal finding on EKG   . Osteomyelitis of ankle or foot, acute 05/18/2015  . Foot abscess, left 05/18/2015  . Hematemesis with nausea   . Gastritis   . Acute esophagitis   . Duodenitis   . Cellulitis 04/05/2015    Past Surgical History: Past Surgical History  Procedure Laterality Date  . No past surgeries    . I&d extremity Left 04/06/2015    Procedure: IRRIGATION AND DEBRIDEMENT EXTREMITY;  Surgeon: Gwyneth Revels, DPM;  Location: [facility] ORS;  Service: Podiatry;  Laterality: Left;  . Esophagogastroduodenoscopy N/A 04/07/2015    Procedure: ESOPHAGOGASTRODUODENOSCOPY (EGD);  Surgeon: Midge Minium, MD;  Location: [facility] ENDOSCOPY;  Service: Endoscopy;  Laterality: N/A;  . Foot surgery    . Irrigation and debridement foot Left 05/21/2015    Procedure: IRRIGATION AND DEBRIDEMENT FOOT;  Surgeon: Linus Galas, MD;  Location: [facility] ORS;  Service: Podiatry;  Laterality: Left;    Allergies: Allergies  Allergen Reactions  . Sulfur  Other (See Comments)    Renal failure    Home Medications: Prescriptions prior to admission  Medication Sig Dispense Refill Last Dose  . ciprofloxacin (CIPRO) 500 MG tablet Take 1 tablet by mouth 2 (two) times daily.   06/28/2015 at Unknown time  . cloNIDine (CATAPRES) 0.2 MG tablet Take 0.2 mg by mouth 2 (two) times daily.   4 06/28/2015 at Unknown time  . doxycycline (VIBRAMYCIN) 100 MG capsule Take 1 capsule by mouth 2 (two) times daily.   06/28/2015 at Unknown time  . oxyCODONE (OXY IR/ROXICODONE) 5 MG immediate release tablet Take 1 tablet (5 mg total) by mouth every 4 (four)  hours as needed for moderate pain. 30 tablet 0 prn at prn  . TRESIBA FLEXTOUCH 200 UNIT/ML SOPN Inject 25 Units into the skin daily.   5 06/28/2015 at Unknown time  . albuterol (PROVENTIL) (2.5 MG/3ML) 0.083% nebulizer solution Take 3 mLs (2.5 mg total) by nebulization every 2 (two) hours as needed for wheezing. (Patient not taking: Reported on 06/28/2015) 75 mL 12 Not Taking at Unknown time  . amLODipine (NORVASC) 10 MG tablet Take 1 tablet (10 mg total) by mouth daily. (Patient not taking: Reported on 05/18/2015) 30 tablet 0 Not Taking at Unknown time  . aspirin EC 325 MG EC tablet Take 1 tablet (325 mg total) by mouth daily. (Patient not taking: Reported on 06/28/2015) 30 tablet 0 Not Taking at Unknown time  . atorvastatin (LIPITOR) 80 MG tablet Take 1 tablet (80 mg total) by mouth daily at 6 PM. (Patient not taking: Reported on 06/28/2015) 30 tablet 3 Not Taking at Unknown time  . DAPTOmycin 500 mg in sodium chloride 0.9 % 100 mL Inject 500 mg into the vein daily. (Patient not taking: Reported on 06/28/2015) 28 Dose 0 Completed Course at Unknown time  . insulin aspart (NOVOLOG) 100 UNIT/ML injection Inject 0-9 Units into the skin 3 (three) times daily with meals. (Patient not taking: Reported on 06/28/2015) 10 mL 11 Not Taking at Unknown time  . pantoprazole (PROTONIX) 40 MG tablet Take 1 tablet (40 mg total) by mouth daily. (Patient not taking: Reported on 06/28/2015) 30 tablet 0 Not Taking at Unknown time  . senna-docusate (SENOKOT-S) 8.6-50 MG per tablet Take 2 tablets by mouth 2 (two) times daily. (Patient not taking: Reported on 06/28/2015) 60 tablet 2 Not Taking at Unknown time   Home medication reconciliation was completed with the patient.   Scheduled Inpatient Medications:   . insulin aspart  0-15 Units Subcutaneous TID WC  . insulin glargine  18 Units Subcutaneous QHS  . pantoprazole (PROTONIX) IV  40 mg Intravenous Q12H  . piperacillin-tazobactam (ZOSYN)  IV  3.375 g Intravenous 3 times per  day  . sodium chloride  3 mL Intravenous Q12H    Continuous Inpatient Infusions:   . sodium chloride 150 mL/hr at 06/29/15 1012    PRN Inpatient Medications:  hydrALAZINE, morphine injection, ondansetron (ZOFRAN) IV  Family History: family history includes Breast cancer in his sister; Coronary artery disease in his father; Lung cancer in his brother; Pancreatic cancer in his mother and mother.    Social History:   reports that he has never smoked. He does not have any smokeless tobacco history on file. He reports that he does not drink alcohol or use illicit drugs.   Review of Systems: Constitutional: Weight is stable.  Eyes: No changes in vision. ENT: No oral lesions, sore throat.  GI: see HPI.  Heme/Lymph: No easy bruising.  CV: No chest pain.  GU: No hematuria.  Integumentary: No rashes.  Neuro: No headaches.  Psych: No depression/anxiety.  Endocrine: No heat/cold intolerance.  Allergic/Immunologic: No urticaria.  Resp: No cough, SOB.  Musculoskeletal: No joint swelling.    Physical Examination: BP 159/92 mmHg  Pulse 109  Temp(Src) 98.2 F (36.8 C) (Oral)  Resp 18  Ht  (1.88 m)  Wt 87.544 kg (193 lb)  BMI 24.77 kg/m2  SpO2 98% Gen: NAD, alert and oriented x 4 HEENT: PEERLA, EOMI, Neck: supple, no JVD or thyromegaly Chest: CTA bilaterally, no wheezes, crackles, or other adventitious sounds CV: RRR, no m/g/c/r Abd: soft, NT, ND, +BS in all four quadrants; no HSM, guarding, ridigity, or rebound tenderness Ext: no edema, well perfused with 2+ pulses, Lymph: no LAD  Data: Lab Results  Component Value Date   WBC 8.8 06/29/2015   HGB 9.2* 06/29/2015   HCT 29.2* 06/29/2015   MCV 77.1* 06/29/2015   PLT 135* 06/29/2015    Recent Labs Lab 06/28/15 2046 06/29/15 1035  HGB 11.0* 9.2*   Lab Results  Component Value Date   NA 147* 06/29/2015   K 3.8 06/29/2015   CL 116* 06/29/2015   CO2 24 06/29/2015   BUN 38* 06/29/2015   CREATININE 1.91*  06/29/2015   Lab Results  Component Value Date   ALT 23 06/28/2015   AST 36 06/28/2015   ALKPHOS 85 06/28/2015   BILITOT 1.4* 06/28/2015   No results for input(s): APTT, INR, PTT in the last 168 hours.   Assessment/Plan: Juan Roberson is a 44 y.o. male with recent episodes of coffee ground emesis.  His WBC on admission was 15.6, now 8.8,  His pulse 109, blood pressure 159/92, hemoglobin 11.0 on admission now 9.2, platelets 135, MCV 77.1, BUN 38, creatinine 1.91. He had   At upper endoscopy completed in July, 2016 which showed esophagitis, patient reports he has not taken Protonix in a while, and was recently started on doxycycline twice a day.  Recommendations: We will proceed with an upper endoscopy tomorrow afternoon for further evaluation. We agree with continuing Protonix 40 mg twice a day.  Patient should plan to follow up in our office after discharge. Please see Dr. Marva Panda note for further recommendations.  Thank you for the consult. Please call with questions or concerns.  Carney Harder, PA-C  I personally performed these services.

## 2015-06-29 NOTE — Progress Notes (Signed)
Pt's BP & P continues to be elevated. Notified Dr. Clint Guy of pt's status and that he continues to vomit after PRN zofran given. New orders for PRN phenergan x1 now. Also new orders for another dose of PRN hydralazine for elevated BP.

## 2015-06-29 NOTE — Progress Notes (Signed)
ANTIBIOTIC CONSULT NOTE - INITIAL  Pharmacy Consult for Zosyn Indication: Intra-abdominal infection  Allergies  Allergen Reactions  . Sulfur Other (See Comments)    Renal failure    Patient Measurements: Height:  (188 cm) Weight: 193 lb (87.544 kg) IBW/kg (Calculated) : 82.2 Adjusted Body Weight: 87.5 kg  Vital Signs: Temp: 98.4 F (36.9 C) (09/27 0223) Temp Source: Oral (09/27 0223) BP: 191/104 mmHg (09/27 0250) Pulse Rate: 139 (09/27 0250) Intake/Output from previous day:   Intake/Output from this shift:    Labs:  Recent Labs  06/28/15 2046  WBC 15.6*  HGB 11.0*  PLT 213  CREATININE 1.96*   Estimated Creatinine Clearance: 54.8 mL/min (by C-G formula based on Cr of 1.96). No results for input(s): VANCOTROUGH, VANCOPEAK, VANCORANDOM, GENTTROUGH, GENTPEAK, GENTRANDOM, TOBRATROUGH, TOBRAPEAK, TOBRARND, AMIKACINPEAK, AMIKACINTROU, AMIKACIN in the last 72 hours.   Microbiology: No results found for this or any previous visit (from the past 720 hour(s)).  Medical History: Past Medical History  Diagnosis Date  . Diabetes mellitus without complication     a. Dx ~ 1996.  . Essential hypertension   . CKD (chronic kidney disease), stage III   . GERD (gastroesophageal reflux disease)   . Cellulitis and abscess of foot     Left-Dr. Ether Griffins  . Osteomyelitis     a. 05/2015 L foot.  . Left leg DVT     a. Dx 05/2015 -> Coumadin.  . Gastritis     a. 04/2015 hematemesis -> EGD: gastritis, esophagitis, duodenitis.  No active bleeding.  PPI added.    Medications:  Infusions:  . sodium chloride 150 mL/hr at 06/29/15 0247   Assessment: 44 yom cc coffee-ground emesis. Lightheaded and weak, denies blood in stool or melena. Was on Cipro for osteomyelitis, now starting Zosyn to cover both IAI and osteo.   Goal of Therapy:  Treat osteo and IAI  Plan:  Expected duration 7 days with resolution of temperature and/or normalization of WBC. Zosyn 3.375 gm IV Q8H EI  Carola Frost, Pharm.D. Clinical Pharmacist 06/29/2015,3:00 AM

## 2015-06-29 NOTE — Progress Notes (Signed)
Notified Dr Nemiah Commander of pt elevated troponins (0.22, up from 0.08 on previous labs); Dr acknowledged, no new orders at this time; stated to notify Dr on call if next result is elevated further

## 2015-06-29 NOTE — Progress Notes (Signed)
Called Dr. Clint Guy to inform him of patient's troponin level of 0.24.  Lennon Alstrom N  06/29/2015  10:14 PM

## 2015-06-29 NOTE — H&P (Signed)
Juan Roberson. is an 44 y.o. male.   Chief Complaint: Coffee-ground emesis HPI: The patient presents to emergency department complaining of coffee-ground emesis that began possibly 12 hours prior to admission. She denies pain anywhere in chest pain or shortness of breath. He admits to lightheadedness and feeling weak. He denies blood in his stool or melena. In the emergency department the patient was found to be tachycardic and have a positive emesis prompted the emergency department staff to call for admission.  Past Medical History  Diagnosis Date  . Diabetes mellitus without complication     a. Dx ~ 1996.  . Essential hypertension   . CKD (chronic kidney disease), stage III   . GERD (gastroesophageal reflux disease)   . Cellulitis and abscess of foot     Left-Dr. Vickki Muff  . Osteomyelitis     a. 05/2015 L foot.  . Left leg DVT     a. Dx 05/2015 -> Coumadin.  . Gastritis     a. 04/2015 hematemesis -> EGD: gastritis, esophagitis, duodenitis.  No active bleeding.  PPI added.    Past Surgical History  Procedure Laterality Date  . No past surgeries    . I&d extremity Left 04/06/2015    Procedure: IRRIGATION AND DEBRIDEMENT EXTREMITY;  Surgeon: Samara Deist, DPM;  Location: [facility] ORS;  Service: Podiatry;  Laterality: Left;  . Esophagogastroduodenoscopy N/A 04/07/2015    Procedure: ESOPHAGOGASTRODUODENOSCOPY (EGD);  Surgeon: Lucilla Lame, MD;  Location: [facility] ENDOSCOPY;  Service: Endoscopy;  Laterality: N/A;  . Foot surgery    . Irrigation and debridement foot Left 05/21/2015    Procedure: IRRIGATION AND DEBRIDEMENT FOOT;  Surgeon: Sharlotte Alamo, MD;  Location: [facility] ORS;  Service: Podiatry;  Laterality: Left;    Family History  Problem Relation Age of Onset  . Coronary artery disease Father   . Pancreatic cancer Mother   . Breast cancer Sister   . Lung cancer Brother   . Pancreatic cancer Mother    Social History:  reports that he has never smoked. He does not have any smokeless tobacco  history on file. He reports that he does not drink alcohol or use illicit drugs.  Allergies:  Allergies  Allergen Reactions  . Sulfur Other (See Comments)    Renal failure    Prior to Admission medications   Medication Sig Start Date End Date Taking? Authorizing Kruz Chiu  ciprofloxacin (CIPRO) 500 MG tablet Take 1 tablet by mouth 2 (two) times daily. 06/14/15 07/05/15 Yes Historical Brodee Mauritz, MD  cloNIDine (CATAPRES) 0.2 MG tablet Take 0.2 mg by mouth 2 (two) times daily.  04/30/15  Yes Historical Jaya Lapka, MD  doxycycline (VIBRAMYCIN) 100 MG capsule Take 1 capsule by mouth 2 (two) times daily.   Yes Historical Lucciana Head, MD  oxyCODONE (OXY IR/ROXICODONE) 5 MG immediate release tablet Take 1 tablet (5 mg total) by mouth every 4 (four) hours as needed for moderate pain. 05/24/15  Yes Theodoro Grist, MD  TRESIBA FLEXTOUCH 200 UNIT/ML SOPN Inject 25 Units into the skin daily.  03/29/15  Yes Historical Brennan Karam, MD  albuterol (PROVENTIL) (2.5 MG/3ML) 0.083% nebulizer solution Take 3 mLs (2.5 mg total) by nebulization every 2 (two) hours as needed for wheezing. Patient not taking: Reported on 06/28/2015 05/24/15   Theodoro Grist, MD  amLODipine (NORVASC) 10 MG tablet Take 1 tablet (10 mg total) by mouth daily. Patient not taking: Reported on 05/18/2015 04/08/15   Demetrios Loll, MD  aspirin EC 325 MG EC tablet Take 1 tablet (325 mg total)  by mouth daily. Patient not taking: Reported on 06/28/2015 05/24/15   Theodoro Grist, MD  atorvastatin (LIPITOR) 80 MG tablet Take 1 tablet (80 mg total) by mouth daily at 6 PM. Patient not taking: Reported on 06/28/2015 05/24/15   Theodoro Grist, MD  DAPTOmycin 500 mg in sodium chloride 0.9 % 100 mL Inject 500 mg into the vein daily. Patient not taking: Reported on 06/28/2015 05/25/15   Max Sane, MD  insulin aspart (NOVOLOG) 100 UNIT/ML injection Inject 0-9 Units into the skin 3 (three) times daily with meals. Patient not taking: Reported on 06/28/2015 05/24/15   Theodoro Grist, MD   pantoprazole (PROTONIX) 40 MG tablet Take 1 tablet (40 mg total) by mouth daily. Patient not taking: Reported on 06/28/2015 04/08/15   Demetrios Loll, MD  senna-docusate (SENOKOT-S) 8.6-50 MG per tablet Take 2 tablets by mouth 2 (two) times daily. Patient not taking: Reported on 06/28/2015 05/24/15   Theodoro Grist, MD     Results for orders placed or performed during the hospital encounter of 06/28/15 (from the past 48 hour(s))  Comprehensive metabolic panel     Status: Abnormal   Collection Time: 06/28/15  8:46 PM  Result Value Ref Range   Sodium 144 135 - 145 mmol/L   Potassium 3.5 3.5 - 5.1 mmol/L   Chloride 110 101 - 111 mmol/L   CO2 21 (L) 22 - 32 mmol/L   Glucose, Bld 164 (H) 65 - 99 mg/dL   BUN 36 (H) 6 - 20 mg/dL   Creatinine, Ser 1.96 (H) 0.61 - 1.24 mg/dL   Calcium 10.1 8.9 - 10.3 mg/dL   Total Protein 7.9 6.5 - 8.1 g/dL   Albumin 4.2 3.5 - 5.0 g/dL   AST 36 15 - 41 U/L   ALT 23 17 - 63 U/L   Alkaline Phosphatase 85 38 - 126 U/L   Total Bilirubin 1.4 (H) 0.3 - 1.2 mg/dL   GFR calc non Af Amer 39 (L) >60 mL/min   GFR calc Af Amer 45 (L) >60 mL/min    Comment: (NOTE) The eGFR has been calculated using the CKD EPI equation. This calculation has not been validated in all clinical situations. eGFR's persistently <60 mL/min signify possible Chronic Kidney Disease.    Anion gap 13 5 - 15  CBC with Differential/Platelet     Status: Abnormal   Collection Time: 06/28/15  8:46 PM  Result Value Ref Range   WBC 15.6 (H) 3.8 - 10.6 K/uL   RBC 4.45 4.40 - 5.90 MIL/uL   Hemoglobin 11.0 (L) 13.0 - 18.0 g/dL   HCT 34.7 (L) 40.0 - 52.0 %   MCV 78.0 (L) 80.0 - 100.0 fL   MCH 24.7 (L) 26.0 - 34.0 pg   MCHC 31.7 (L) 32.0 - 36.0 g/dL   RDW 17.5 (H) 11.5 - 14.5 %   Platelets 213 150 - 440 K/uL   Neutrophils Relative % 86 %   Neutro Abs 13.4 (H) 1.4 - 6.5 K/uL   Lymphocytes Relative 11 %   Lymphs Abs 1.7 1.0 - 3.6 K/uL   Monocytes Relative 3 %   Monocytes Absolute 0.5 0.2 - 1.0 K/uL    Eosinophils Relative 0 %   Eosinophils Absolute 0.0 0 - 0.7 K/uL   Basophils Relative 0 %   Basophils Absolute 0.0 0 - 0.1 K/uL  Lipase, blood     Status: Abnormal   Collection Time: 06/28/15  8:46 PM  Result Value Ref Range   Lipase 20 (L) 22 -  51 U/L  Troponin I     Status: Abnormal   Collection Time: 06/28/15  8:46 PM  Result Value Ref Range   Troponin I 0.08 (H) <0.031 ng/mL    Comment: READ BACK AND VERIFIED GENINVA RODRIGUEZ 06/28/2015 2224 [facility]        PERSISTENTLY INCREASED TROPONIN VALUES IN THE RANGE OF 0.04-0.49 ng/mL CAN BE SEEN IN:       -UNSTABLE ANGINA       -CONGESTIVE HEART FAILURE       -MYOCARDITIS       -CHEST TRAUMA       -ARRYHTHMIAS       -LATE PRESENTING MYOCARDIAL INFARCTION       -COPD   CLINICAL FOLLOW-UP RECOMMENDED.   Type and screen     Status: None   Collection Time: 06/28/15  9:30 PM  Result Value Ref Range   ABO/RH(D) A POS    Antibody Screen NEG    Sample Expiration 07/01/2015   ABO/Rh     Status: None   Collection Time: 06/28/15  9:31 PM  Result Value Ref Range   ABO/RH(D) A POS    No results found.  Review of Systems  Constitutional: Negative for fever and chills.  HENT: Negative for sore throat and tinnitus.   Eyes: Negative for blurred vision and redness.  Respiratory: Negative for cough and shortness of breath.   Cardiovascular: Negative for chest pain, palpitations, orthopnea and PND.  Gastrointestinal: Positive for nausea and vomiting. Negative for abdominal pain, diarrhea, blood in stool and melena.  Genitourinary: Negative for dysuria, urgency and frequency.  Musculoskeletal: Negative for myalgias and joint pain.  Skin: Negative for rash.       No lesions  Neurological: Positive for weakness. Negative for speech change and focal weakness.       Lightheaded  Endo/Heme/Allergies: Does not bruise/bleed easily.       No temperature intolerance  Psychiatric/Behavioral: Negative for depression and suicidal ideas.    Blood  pressure 159/81, pulse 112, temperature 98.6 F (37 C), temperature source Oral, resp. rate 18, height $RemoveBe'6\' 2"'[id number]$  (1.88 m), weight 87.544 kg (193 lb), SpO2 95 %. Physical Exam  Nursing note and vitals reviewed. Constitutional: He is oriented to person, place, and time. He appears well-developed and well-nourished. No distress.  HENT:  Head: Normocephalic and atraumatic.  Mouth/Throat: Oropharynx is clear and moist.  Eyes: Conjunctivae and EOM are normal. Pupils are equal, round, and reactive to light. No scleral icterus.  Neck: Normal range of motion. Neck supple. No JVD present. No tracheal deviation present. No thyromegaly present.  Cardiovascular: Regular rhythm.  Tachycardia present.  Exam reveals no gallop and no friction rub.   No murmur heard. Respiratory: Effort normal and breath sounds normal. No respiratory distress.  GI: Soft. Bowel sounds are normal. He exhibits no distension. There is no tenderness.  Genitourinary:  Deferred  Musculoskeletal: Normal range of motion. He exhibits no edema.  Lymphadenopathy:    He has no cervical adenopathy.  Neurological: He is alert and oriented to person, place, and time. No cranial nerve deficit.  Skin: Skin is warm and dry. No rash noted. No erythema.  Psychiatric: He has a normal mood and affect. His behavior is normal. Judgment and thought content normal.     Assessment/Plan This is a 44 year old Caucasian male admitted for GI bleed. 1. GI bleed: Likely gastric as the patient has had coffee-ground emesis. Differential diagnosis includes duodenal ulcer as patient has a history of duodenitis. I've  made him nothing by mouth and started him on IV Protonix. Gastroenterology consult ordered. 2 large-bore IVs and intravenous fluid at 1-1/2 maintenance rate. 2. Osteomyelitis: Left foot. Continue Cipro (will also cover translocation of enteric organisms) 3. Chronic kidney disease: Stage III; improved since previous admission. Continue intravenous  hydration and avoid nephrotoxic agents. 4. Leukocytosis: Secondary to GI bleeding. Antibiotics as above 5. Sepsis: Patient meets criteria via tachycardia and leukocytosis. We will obtain blood cultures however at this time his tachycardia is likely secondary to volume depletion. 6. Hypertension: Replace clonidine with IV hydralazine as needed for histology blood pressure greater than 165 7. Diabetes mellitus type II: Continue basal insulin adjusted for reduced calorie intake and carb controlled diet; sliding-scale insulin hospitalized 8. DVT prophylaxis: SCDs 9. GI prophylaxis: As above The patient is a full code. Time spent on admission orders and patient care approximately 35 minutes.  Harrie Foreman 06/29/2015, 1:14 AM

## 2015-06-29 NOTE — Consult Note (Signed)
Subjective: Patient seen for coffee ground emesis. Patient seen and examined, chart reviewed Please see full GI consult by Ms. Richards. We'll arrange for EGD tomorrow afternoon. Last episode of hematemesis was before coming to the hospital. Hemodynamically stable, hypertension noted. I have discussed the risks benefits and complications of procedures to include not limited to bleeding, infection, perforation and the risk of sedation and the patient wishes to proceed.  Objective: Vital signs in last 24 hours: Temp:  [98.2 F (36.8 C)-98.4 F (36.9 C)] 98.2 F (36.8 C) (09/27 1202) Pulse Rate:  [101-155] 107 (09/27 2144) Resp:  [16-20] 18 (09/27 1202) BP: (139-205)/(74-120) 139/98 mmHg (09/27 2144) SpO2:  [95 %-99 %] 98 % (09/27 1202) Blood pressure 139/98, pulse 107, temperature 98.2 F (36.8 C), temperature source Oral, resp. rate 18, height  (1.88 m), weight 87.544 kg (193 lb), SpO2 98 %.   Intake/Output from previous day: 09/26 0701 - 09/27 0700 In: 530 [I.V.:502; IV Piggyback:28] Out: 150 [Emesis/NG output:150]  Intake/Output this shift: Total I/O In: 3 [I.V.:3] Out: -    General appearance:  [name] no acute distress Resp:  Clear to auscultation Cardio:  Regular rate and rhythm GI:  Soft nontender nondistended bowel sounds positive normoactive, protuberant Extremities:  No clubbing cyanosis or edema   Lab Results: Results for orders placed or performed during the hospital encounter of 06/28/15 (from the past 24 hour(s))  Culture, blood (routine x 2)     Status: None (Preliminary result)   Collection Time: 06/29/15  6:55 AM  Result Value Ref Range   Specimen Description BLOOD RIGHT HAND    Special Requests BOTTLES DRAWN AEROBIC AND ANAEROBIC  1 CC    Culture NO GROWTH < 12 HOURS    Report Status PENDING   Culture, blood (routine x 2)     Status: None (Preliminary result)   Collection Time: 06/29/15  7:05 AM  Result Value Ref Range   Specimen  Description BLOOD LEFT HAND    Special Requests BOTTLES DRAWN AEROBIC AND ANAEROBIC  1 CC    Culture NO GROWTH < 12 HOURS    Report Status PENDING   Glucose, capillary     Status: Abnormal   Collection Time: 06/29/15  7:32 AM  Result Value Ref Range   Glucose-Capillary 126 (H) 65 - 99 mg/dL  CBC     Status: Abnormal   Collection Time: 06/29/15 10:35 AM  Result Value Ref Range   WBC 8.8 3.8 - 10.6 K/uL   RBC 3.79 (L) 4.40 - 5.90 MIL/uL   Hemoglobin 9.2 (L) 13.0 - 18.0 g/dL   HCT 16.1 (L) 09.6 - 04.5 %   MCV 77.1 (L) 80.0 - 100.0 fL   MCH 24.2 (L) 26.0 - 34.0 pg   MCHC 31.4 (L) 32.0 - 36.0 g/dL   RDW 40.9 (H) 81.1 - 91.4 %   Platelets 135 (L) 150 - 440 K/uL  Basic metabolic panel     Status: Abnormal   Collection Time: 06/29/15 10:35 AM  Result Value Ref Range   Sodium 147 (H) 135 - 145 mmol/L   Potassium 3.8 3.5 - 5.1 mmol/L   Chloride 116 (H) 101 - 111 mmol/L   CO2 24 22 - 32 mmol/L   Glucose, Bld 111 (H) 65 - 99 mg/dL   BUN 38 (H) 6 - 20 mg/dL   Creatinine, Ser 7.82 (H) 0.61 - 1.24 mg/dL   Calcium 8.8 (L) 8.9 - 10.3 mg/dL   GFR calc non Af  Amer 40 (L) >60 mL/min   GFR calc Af Amer 47 (L) >60 mL/min   Anion gap 7 5 - 15  Glucose, capillary     Status: None   Collection Time: 06/29/15 12:01 PM  Result Value Ref Range   Glucose-Capillary 85 65 - 99 mg/dL  Troponin I (q 6hr x 3)     Status: Abnormal   Collection Time: 06/29/15  3:26 PM  Result Value Ref Range   Troponin I 0.22 (H) <0.031 ng/mL  Glucose, capillary     Status: None   Collection Time: 06/29/15  5:18 PM  Result Value Ref Range   Glucose-Capillary 67 65 - 99 mg/dL  Troponin I (q 6hr x 3)     Status: Abnormal   Collection Time: 06/29/15  9:01 PM  Result Value Ref Range   Troponin I 0.24 (H) <0.031 ng/mL      Recent Labs  06/28/15 2046 06/29/15 1035  WBC 15.6* 8.8  HGB 11.0* 9.2*  HCT 34.7* 29.2*  PLT 213 135*   BMET  Recent Labs  06/28/15 2046 06/29/15 1035  NA 144 147*  K 3.5 3.8  CL 110  116*  CO2 21* 24  GLUCOSE 164* 111*  BUN 36* 38*  CREATININE 1.96* 1.91*  CALCIUM 10.1 8.8*   LFT  Recent Labs  06/28/15 2046  PROT 7.9  ALBUMIN 4.2  AST 36  ALT 23  ALKPHOS 85  BILITOT 1.4*   PT/INR No results for input(s): LABPROT, INR in the last 72 hours. Hepatitis Panel No results for input(s): HEPBSAG, HCVAB, HEPAIGM, HEPBIGM in the last 72 hours. C-Diff No results for input(s): CDIFFTOX in the last 72 hours. No results for input(s): CDIFFPCR in the last 72 hours.   Studies/Results: US Venous Img Lower Bilateral  06/29/2015   CLINICAL DATA:  Bilateral leg pain. History of left peroneal vein DVT.  EXAM: BILATERAL LOWER EXTREMITY VENOUS DOPPLER ULTRASOUND  TECHNIQUE: Gray-scale sonography with graded compression, as well as color Doppler and duplex ultrasound were performed to evaluate the lower extremity deep venous systems from the level of the common femoral vein and including the common femoral, femoral, profunda femoral, popliteal and calf veins including the posterior tibial, peroneal and gastrocnemius veins when visible. The superficial great saphenous vein was also interrogated. Spectral Doppler was utilized to evaluate flow at rest and with distal augmentation maneuvers in the common femoral, femoral and popliteal veins.  COMPARISON:  05/13/2015.  FINDINGS: RIGHT LOWER EXTREMITY  Common Femoral Vein: No evidence of thrombus. Normal compressibility, respiratory phasicity and response to augmentation.  Saphenofemoral Junction: No evidence of thrombus. Normal compressibility and flow on color Doppler imaging.  Profunda Femoral Vein: No evidence of thrombus. Normal compressibility and flow on color Doppler imaging.  Femoral Vein: No evidence of thrombus. Normal compressibility, respiratory phasicity and response to augmentation.  Popliteal Vein: No evidence of thrombus. Normal compressibility, respiratory phasicity and response to augmentation.  Calf Veins: Limited evaluation  of the right calf veins and difficult to exclude thrombus in some of the calf veins.  LEFT LOWER EXTREMITY  Common Femoral Vein: No evidence of thrombus. Normal compressibility, respiratory phasicity and response to augmentation.  Saphenofemoral Junction: No evidence of thrombus. Normal compressibility and flow on color Doppler imaging.  Profunda Femoral Vein: No evidence of thrombus. Normal compressibility and flow on color Doppler imaging.  Femoral Vein: No evidence of thrombus. Normal compressibility, respiratory phasicity and response to augmentation.  Popliteal Vein: No evidence of thrombus. Normal compressibility, respiratory  phasicity and response to augmentation.  Calf Veins: The left posterior tibial veins are compressible. The anterior peroneal vein is non compressible and appears to be occluded. Findings are similar to the previous examination.  IMPRESSION: Stable thrombus in a left peroneal vein. No other evidence for deep vein thrombosis in the left lower extremity.  Limited evaluation of the right calf veins and difficult to exclude a small thrombus in the right calf. No evidence for deep vein thrombosis in the right lower extremity above the calf.   Electronically Signed   By: Richarda Overlie M.D.   On: 06/29/2015 17:10    Scheduled Inpatient Medications:   . ciprofloxacin  500 mg Oral BID  . doxycycline  100 mg Oral Q12H  . insulin aspart  0-15 Units Subcutaneous TID WC  . insulin glargine  18 Units Subcutaneous QHS  . metoprolol tartrate  50 mg Oral BID  . pantoprazole (PROTONIX) IV  40 mg Intravenous Q12H  . sodium chloride  3 mL Intravenous Q12H    Continuous Inpatient Infusions:   . sodium chloride 150 mL/hr at 06/29/15 1717    PRN Inpatient Medications:  hydrALAZINE, morphine injection, ondansetron (ZOFRAN) IV  Miscellaneous:   Assessment:  As above  Plan:  As above  Christena Deem MD 06/29/2015, 10:03 PM

## 2015-06-29 NOTE — Progress Notes (Signed)
[facility] Physicians - [place] at [facility]   PATIENT NAME: Juan Roberson    MR#:  [id number]  DATE OF BIRTH:  02-Feb-1971  SUBJECTIVE:  CHIEF COMPLAINT:   Chief Complaint  Patient presents with  . Emesis   -Patient admitted with hematemesis. Known history of diabetes and osteomyelitis on oral antibiotics. -No further nausea vomiting or abdominal pain at this time. Hemoglobin is stable.  REVIEW OF SYSTEMS:  Review of Systems  Constitutional: Negative for fever and chills.  HENT: Negative for ear discharge, ear pain and nosebleeds.   Eyes: Negative for blurred vision and double vision.  Respiratory: Negative for cough, shortness of breath and wheezing.   Cardiovascular: Negative for chest pain, palpitations and leg swelling.  Gastrointestinal: Positive for nausea and abdominal pain. Negative for vomiting, diarrhea and constipation.  Genitourinary: Negative for dysuria.  Neurological: Negative for dizziness, sensory change, speech change, focal weakness, seizures, weakness and headaches.  Psychiatric/Behavioral: Negative for depression. The patient is not nervous/anxious.     DRUG ALLERGIES:   Allergies  Allergen Reactions  . Sulfur Other (See Comments)    Renal failure    VITALS:  Blood pressure 159/92, pulse 109, temperature 98.2 F (36.8 C), temperature source Oral, resp. rate 18, height  (1.88 m), weight 87.544 kg (193 lb), SpO2 98 %.  PHYSICAL EXAMINATION:  Physical Exam  GENERAL:  44 y.o.-year-old patient lying in the bed with no acute distress.  EYES: Pupils equal, round, reactive to light and accommodation. No scleral icterus. Extraocular muscles intact.  HEENT: Head atraumatic, normocephalic. Oropharynx and nasopharynx clear.  NECK:  Supple, no jugular venous distention. No thyroid enlargement, no tenderness.  LUNGS: Normal breath sounds bilaterally, no wheezing, rales,rhonchi or crepitation. No use of accessory muscles of respiration.   CARDIOVASCULAR: S1, S2 normal. No murmurs, rubs, or gallops.  ABDOMEN: Soft, minimal abdominal tenderness-in the epigastric and periumbilical region, nondistended. Bowel sounds present. No organomegaly or mass.  EXTREMITIES: No pedal edema, cyanosis, or clubbing.  NEUROLOGIC: Cranial nerves II through XII are intact. Muscle strength 5/5 in all extremities. Sensation intact. Gait not checked.  PSYCHIATRIC: The patient is alert and oriented x 3.  SKIN: No obvious rash, lesion, or ulcer.    LABORATORY PANEL:   CBC  Recent Labs Lab 06/29/15 1035  WBC 8.8  HGB 9.2*  HCT 29.2*  PLT 135*   ------------------------------------------------------------------------------------------------------------------  Chemistries   Recent Labs Lab 06/28/15 2046 06/29/15 1035  NA 144 147*  K 3.5 3.8  CL 110 116*  CO2 21* 24  GLUCOSE 164* 111*  BUN 36* 38*  CREATININE 1.96* 1.91*  CALCIUM 10.1 8.8*  AST 36  --   ALT 23  --   ALKPHOS 85  --   BILITOT 1.4*  --    ------------------------------------------------------------------------------------------------------------------  Cardiac Enzymes  Recent Labs Lab 06/28/15 2046  TROPONINI 0.08*   ------------------------------------------------------------------------------------------------------------------  RADIOLOGY:  No results found.  EKG:   Orders placed or performed during the hospital encounter of 06/28/15  . ED EKG  . ED EKG  . EKG 12-Lead  . EKG 12-Lead    ASSESSMENT AND PLAN:   44 year old male with past medical history significant for insulin-dependent diabetes mellitus, CK D stage III, history of osteomyelitis of left foot, history of DVT presents to the hospital secondary to hematemesis.  #1 hematemesis-known history of gastritis. -Continue IV Protonix. Avoid NSAID -GI consulted, nothing by mouth for now. -Likely will need an EGD. If no plans today, we'll start on clear  liquid diet. -Hemoglobin is  stable  #2 elevated troponins-demand ischemia from blood pressure being elevated. -No chest pain, no EKG changes. -Recycle troponins.  #3 history of left foot osteomyelitis-likely finished daptomycin. On oral ciprofloxacin and doxycycline as outpatient. -We'll continue those medications.  #4 hypertensive urgency-start metoprolol as he is tachycardic as well. -Continue hydralazine when necessary for now.  #5 insulin-dependent diabetes mellitus-on Lantus and sliding scale insulin. Continue to monitor sugars as only on clear liquid diet.  #6 DVT prophylaxis-Ted's and SCDs   All the records are reviewed and case discussed with Care Management/Social Workerr. Management plans discussed with the patient, family and they are in agreement.  CODE STATUS: Full Code  TOTAL TIME TAKING CARE OF THIS PATIENT: 36 minutes.   POSSIBLE D/C IN 1-2 DAYS, DEPENDING ON CLINICAL CONDITION.   Enid Baas M.D on 06/29/2015 at 2:30 PM  Between 7am to 6pm - Pager - [phone]  After 6pm go to [email or web address] - password [email or web address] [facility]  [place] [facility]  Office  [phone]  CC: Primary care physician; Corky Downs, MD

## 2015-06-30 ENCOUNTER — Inpatient Hospital Stay: Payer: BLUE CROSS/BLUE SHIELD | Admitting: Anesthesiology

## 2015-06-30 ENCOUNTER — Encounter
Admission: EM | Disposition: A | Discharge status: Home / Self Care | Payer: Self-pay | Source: Home / Self Care | Attending: Internal Medicine

## 2015-06-30 HISTORY — PX: ESOPHAGOGASTRODUODENOSCOPY (EGD) WITH PROPOFOL: SHX5813

## 2015-06-30 LAB — GLUCOSE, CAPILLARY
Glucose-Capillary: 155 mg/dL — ABNORMAL HIGH (ref 65–99)
Glucose-Capillary: 113 mg/dL — ABNORMAL HIGH (ref 65–99)
Glucose-Capillary: 87 mg/dL (ref 65–99)
Glucose-Capillary: 191 mg/dL — ABNORMAL HIGH (ref 65–99)

## 2015-06-30 LAB — CBC
HCT: 31.4 % — ABNORMAL LOW (ref 40.0–52.0)
HEMOGLOBIN: 9.7 g/dL — AB (ref 13.0–18.0)
MCH: 24.3 pg — AB (ref 26.0–34.0)
MCHC: 30.9 g/dL — ABNORMAL LOW (ref 32.0–36.0)
MCV: 78.7 fL — ABNORMAL LOW (ref 80.0–100.0)
PLATELETS: 132 10*3/uL — AB (ref 150–440)
RBC: 3.99 MIL/uL — AB (ref 4.40–5.90)
RDW: 17.8 % — ABNORMAL HIGH (ref 11.5–14.5)
WBC: 8.6 10*3/uL (ref 3.8–10.6)

## 2015-06-30 LAB — BASIC METABOLIC PANEL
ANION GAP: 9 (ref 5–15)
BUN: 33 mg/dL — ABNORMAL HIGH (ref 6–20)
CHLORIDE: 108 mmol/L (ref 101–111)
CO2: 22 mmol/L (ref 22–32)
Calcium: 8.2 mg/dL — ABNORMAL LOW (ref 8.9–10.3)
Creatinine, Ser: 1.86 mg/dL — ABNORMAL HIGH (ref 0.61–1.24)
GFR calc Af Amer: 48 mL/min — ABNORMAL LOW (ref >60)
GFR, EST NON AFRICAN AMERICAN: 42 mL/min — AB (ref >60)
Glucose, Bld: 150 mg/dL — ABNORMAL HIGH (ref 65–99)
POTASSIUM: 4.1 mmol/L (ref 3.5–5.1)
SODIUM: 139 mmol/L (ref 135–145)

## 2015-06-30 LAB — PROTIME-INR
INR: 1.32
Prothrombin Time: 16.6 seconds — ABNORMAL HIGH (ref 11.4–15.0)

## 2015-06-30 SURGERY — EGD (ESOPHAGOGASTRODUODENOSCOPY)
Anesthesia: General

## 2015-06-30 SURGERY — ESOPHAGOGASTRODUODENOSCOPY (EGD) WITH PROPOFOL
Anesthesia: General | Laterality: Left

## 2015-06-30 MED ORDER — LACTATED RINGERS IV SOLN
INTRAVENOUS | Status: DC | PRN
  Administered 2015-06-30: 13:00:00 via INTRAVENOUS

## 2015-06-30 MED ORDER — PANTOPRAZOLE SODIUM 40 MG IV SOLR
40.0000 mg | Freq: Two times a day (BID) | INTRAVENOUS | Status: DC
  Administered 2015-06-30 – 2015-07-01 (×3): 40 mg via INTRAVENOUS
  Filled 2015-06-30 (×3): qty 40

## 2015-06-30 MED ORDER — HYDRALAZINE HCL 25 MG PO TABS: 25.0000 mg | ORAL_TABLET | Freq: Three times a day (TID) | ORAL | Status: DC

## 2015-06-30 MED ORDER — CLONIDINE HCL 0.1 MG PO TABS
0.1000 mg | ORAL_TABLET | Freq: Two times a day (BID) | ORAL | Status: DC
  Administered 2015-06-30 – 2015-07-01 (×3): 0.1 mg via ORAL
  Filled 2015-06-30 (×3): qty 1

## 2015-06-30 MED ORDER — PROPOFOL 500 MG/50ML IV EMUL
INTRAVENOUS | Status: DC | PRN
  Administered 2015-06-30: 120 ug/kg/min via INTRAVENOUS

## 2015-06-30 MED ORDER — PANTOPRAZOLE SODIUM 40 MG PO TBEC: 40.0000 mg | DELAYED_RELEASE_TABLET | Freq: Two times a day (BID) | ORAL | Status: DC

## 2015-06-30 MED ORDER — CLOTRIMAZOLE 10 MG MT TROC
10.0000 mg | Freq: Every day | OROMUCOSAL | Status: DC
  Administered 2015-06-30 – 2015-07-01 (×3): 10 mg via ORAL
  Filled 2015-06-30 (×3): qty 1

## 2015-06-30 MED ORDER — PROPOFOL 10 MG/ML IV BOLUS
INTRAVENOUS | Status: DC | PRN
  Administered 2015-06-30 (×2): 50 mg via INTRAVENOUS

## 2015-06-30 MED ORDER — SUCRALFATE 1 GM/10ML PO SUSP
1.0000 g | Freq: Three times a day (TID) | ORAL | Status: DC
  Administered 2015-06-30 – 2015-07-01 (×3): 1 g via ORAL
  Filled 2015-06-30 (×3): qty 10

## 2015-06-30 MED ORDER — AMLODIPINE BESYLATE 10 MG PO TABS
10.0000 mg | ORAL_TABLET | Freq: Every day | ORAL | Status: DC
  Administered 2015-06-30 – 2015-07-01 (×2): 10 mg via ORAL
  Filled 2015-06-30 (×2): qty 1

## 2015-06-30 NOTE — Anesthesia Postprocedure Evaluation (Signed)
  Anesthesia Post-op Note  Patient: Juan Roberson.  Procedure(s) Performed: Procedure(s): ESOPHAGOGASTRODUODENOSCOPY (EGD) WITH PROPOFOL (Left)  Anesthesia type:No value filed.  Patient location: PACU  Post pain: Pain level controlled  Post assessment: Post-op Vital signs reviewed, Patient's Cardiovascular Status Stable, Respiratory Function Stable, Patent Airway and No signs of Nausea or vomiting  Post vital signs: Reviewed and stable  Last Vitals:  Filed Vitals:   06/30/15 1445  BP: 155/89  Pulse: 80  Temp:   Resp: 16    Level of consciousness: awake, alert  and patient cooperative  Complications: No apparent anesthesia complications

## 2015-06-30 NOTE — Progress Notes (Signed)
[facility] Physicians - [place] at [facility]   PATIENT NAME: Juan Roberson    MR#:  [id number]  DATE OF BIRTH:  August 20, 1971  SUBJECTIVE:  CHIEF COMPLAINT:   Chief Complaint  Patient presents with  . Emesis   - Patient presented with hematemesis. Hemoglobin has been stable. For upper GI endoscopy today. -Troponin borderline elevated, likely demand ischemia from elevated blood pressure. Denies any chest pain. -Known history of left peroneal DVT, was not taking any anticoagulation as outpatient. Was only on aspirin.  REVIEW OF SYSTEMS:  Review of Systems  Constitutional: Negative for fever and chills.  HENT: Negative for ear discharge, ear pain and nosebleeds.   Eyes: Negative for blurred vision and double vision.  Respiratory: Negative for cough, shortness of breath and wheezing.   Cardiovascular: Negative for chest pain, palpitations and leg swelling.  Gastrointestinal: Negative for nausea, vomiting, abdominal pain, diarrhea and constipation.  Genitourinary: Negative for dysuria.  Musculoskeletal: Negative for myalgias, back pain and neck pain.  Neurological: Negative for dizziness, sensory change, speech change, focal weakness, seizures, weakness and headaches.  Psychiatric/Behavioral: Negative for depression. The patient is not nervous/anxious.     DRUG ALLERGIES:   Allergies  Allergen Reactions  . Sulfur Other (See Comments)    Renal failure    VITALS:  Blood pressure 188/108, pulse 78, temperature 98.6 F (37 C), temperature source Oral, resp. rate 18, height  (1.88 m), weight 87.544 kg (193 lb), SpO2 98 %.  PHYSICAL EXAMINATION:  Physical Exam  GENERAL:  44 y.o.-year-old patient lying in the bed with no acute distress.  EYES: Pupils equal, round, reactive to light and accommodation. No scleral icterus. Extraocular muscles intact.  HEENT: Head atraumatic, normocephalic. Oropharynx and nasopharynx clear.  NECK:  Supple, no jugular venous  distention. No thyroid enlargement, no tenderness.  LUNGS: Normal breath sounds bilaterally, no wheezing, rales,rhonchi or crepitation. No use of accessory muscles of respiration.  CARDIOVASCULAR: S1, S2 normal. No murmurs, rubs, or gallops.  ABDOMEN: Soft, minimal abdominal tenderness-in the epigastric and periumbilical region, nondistended. Bowel sounds present. No organomegaly or mass.  EXTREMITIES: No pedal edema, cyanosis, or clubbing. Left leg is slightly swollen than the right one. NEUROLOGIC: Cranial nerves II through XII are intact. Muscle strength 5/5 in all extremities. Sensation intact. Gait not checked.  PSYCHIATRIC: The patient is alert and oriented x 3.  SKIN: No obvious rash, lesion, or ulcer.    LABORATORY PANEL:   CBC  Recent Labs Lab 06/30/15 0459  WBC 8.6  HGB 9.7*  HCT 31.4*  PLT 132*   ------------------------------------------------------------------------------------------------------------------  Chemistries   Recent Labs Lab 06/28/15 2046  06/30/15 0459  NA 144  < > 139  K 3.5  < > 4.1  CL 110  < > 108  CO2 21*  < > 22  GLUCOSE 164*  < > 150*  BUN 36*  < > 33*  CREATININE 1.96*  < > 1.86*  CALCIUM 10.1  < > 8.2*  AST 36  --   --   ALT 23  --   --   ALKPHOS 85  --   --   BILITOT 1.4*  --   --   < > = values in this interval not displayed. ------------------------------------------------------------------------------------------------------------------  Cardiac Enzymes  Recent Labs Lab 06/29/15 2101  TROPONINI 0.24*   ------------------------------------------------------------------------------------------------------------------  RADIOLOGY:  US Venous Img Lower Bilateral  06/29/2015   CLINICAL DATA:  Bilateral leg pain. History of left peroneal vein DVT.  EXAM: BILATERAL  LOWER EXTREMITY VENOUS DOPPLER ULTRASOUND  TECHNIQUE: Gray-scale sonography with graded compression, as well as color Doppler and duplex ultrasound were performed to  evaluate the lower extremity deep venous systems from the level of the common femoral vein and including the common femoral, femoral, profunda femoral, popliteal and calf veins including the posterior tibial, peroneal and gastrocnemius veins when visible. The superficial great saphenous vein was also interrogated. Spectral Doppler was utilized to evaluate flow at rest and with distal augmentation maneuvers in the common femoral, femoral and popliteal veins.  COMPARISON:  05/13/2015.  FINDINGS: RIGHT LOWER EXTREMITY  Common Femoral Vein: No evidence of thrombus. Normal compressibility, respiratory phasicity and response to augmentation.  Saphenofemoral Junction: No evidence of thrombus. Normal compressibility and flow on color Doppler imaging.  Profunda Femoral Vein: No evidence of thrombus. Normal compressibility and flow on color Doppler imaging.  Femoral Vein: No evidence of thrombus. Normal compressibility, respiratory phasicity and response to augmentation.  Popliteal Vein: No evidence of thrombus. Normal compressibility, respiratory phasicity and response to augmentation.  Calf Veins: Limited evaluation of the right calf veins and difficult to exclude thrombus in some of the calf veins.  LEFT LOWER EXTREMITY  Common Femoral Vein: No evidence of thrombus. Normal compressibility, respiratory phasicity and response to augmentation.  Saphenofemoral Junction: No evidence of thrombus. Normal compressibility and flow on color Doppler imaging.  Profunda Femoral Vein: No evidence of thrombus. Normal compressibility and flow on color Doppler imaging.  Femoral Vein: No evidence of thrombus. Normal compressibility, respiratory phasicity and response to augmentation.  Popliteal Vein: No evidence of thrombus. Normal compressibility, respiratory phasicity and response to augmentation.  Calf Veins: The left posterior tibial veins are compressible. The anterior peroneal vein is non compressible and appears to be occluded.  Findings are similar to the previous examination.  IMPRESSION: Stable thrombus in a left peroneal vein. No other evidence for deep vein thrombosis in the left lower extremity.  Limited evaluation of the right calf veins and difficult to exclude a small thrombus in the right calf. No evidence for deep vein thrombosis in the right lower extremity above the calf.   Electronically Signed   By: Richarda Overlie M.D.   On: 06/29/2015 17:10    EKG:   Orders placed or performed during the hospital encounter of 06/28/15  . ED EKG  . ED EKG  . EKG 12-Lead  . EKG 12-Lead    ASSESSMENT AND PLAN:   44 year old male with past medical history significant for insulin-dependent diabetes mellitus, CK D stage III, history of osteomyelitis of left foot, history of DVT presents to the hospital secondary to hematemesis.  #1 Hematemesis-known history of gastritis. -Continue IV Protonix. Avoid NSAID - likely protonix can be changed to PO later today or tomorrow -GI consulted, for EGD today -Hemoglobin is stable  #2 elevated troponins-demand ischemia from blood pressure being elevated. -No chest pain, no EKG changes. -advised for outpatient stress test due to h/o DM and HTN.  #3 history of left foot osteomyelitis-likely finished daptomycin. On oral ciprofloxacin and doxycycline as outpatient. -We'll continue those medications.  #4 hypertensive urgency-restarted some of home meds- norvasc and clonidine today - also started metoprolol as he is tachycardic as well. -Continue hydralazine IV when necessary for now.  #5 insulin-dependent diabetes mellitus-on Lantus and sliding scale insulin. Continue to monitor sugars as only on limited diet.  #6 left peroneal DVT- had surgery on his left foot for osteomyelitis in the past. In August his DVT was diagnosed. Patient says  he was taking Coumadin for a while and then stopped and is only on aspirin. -We'll likely will need anticoagulation for at least 3-6 months due to  DVT. -Await EGD report to see any risk for further GI bleed -If not,  will discharge on eliquis. If there is risk of bleeding, vascular has been consulted to see if he would be a candidate for IVC filter placement.   All the records are reviewed and case discussed with Care Management/Social Workerr. Management plans discussed with the patient, family and they are in agreement.  CODE STATUS: Full Code  TOTAL TIME TAKING CARE OF THIS PATIENT: 36 minutes.   POSSIBLE D/C TOMORROW, DEPENDING ON CLINICAL CONDITION.   Enid Baas M.D on 06/30/2015 at 12:50 PM  Between 7am to 6pm - Pager - [phone]  After 6pm go to [email or web address] - password [email or web address] [facility]  [place] [facility]  Office  [phone]  CC: Primary care physician; Corky Downs, MD

## 2015-06-30 NOTE — Progress Notes (Signed)
Patient off floor, likely for endoscopy Will see on return Peroneal DVT which has been present for several weeks is a very low risk DVT and would not absolutely require a filter to be placed at this time.  Can consider monitoring and rechecking a duplex in 1-2 weeks to assess for progression

## 2015-06-30 NOTE — Op Note (Signed)
[facility] Gastroenterology Patient Name: Juan Roberson Procedure Date: 06/30/2015 1:19 PM MRN: [id number] Account #: [id number] Date of Birth: [birth year] Admit Type: Inpatient Age: 44 Room: [facility] ENDO ROOM 1 Gender: Male Note Status: Finalized Procedure:         Upper GI endoscopy Indications:       Coffee-ground emesis Providers:         Christena Deem, MD Referring MD:      Corky Downs, MD (Referring MD) Medicines:         Monitored Anesthesia Care Complications:     No immediate complications. Procedure:         Pre-Anesthesia Assessment:                    - ASA Grade Assessment: III - A patient with severe                     systemic disease.                    After obtaining informed consent, the endoscope was passed                     under direct vision. Throughout the procedure, the                     patient's blood pressure, pulse, and oxygen saturations                     were monitored continuously. The Endoscope was introduced                     through the mouth, and advanced to the third part of                     duodenum. The patient tolerated the procedure well. Findings:      LA Grade D (one or more mucosal breaks involving at least 75% of       esophageal circumference) esophagitis with no bleeding was found.      The exam of the esophagus was otherwise normal.      Multiple dispersed, medium-sized non-bleeding erosions were found in the       gastric body, upper stomach and cardia. No active bleeding      The examined duodenum was normal.      no biopsies taken due to ongoing treatment for DVT with high dose asa.      [name] was found in the subglottic space. Impression:        - LA Grade D erosive esophagitis.                    - Non-bleeding erosive gastropathy.                    - Normal examined duodenum.                    - No specimens collected. Recommendation:    - Use Protonix (pantoprazole) 40 mg PO BID for 6  weeks.                    - Use Protonix (pantoprazole) 40 mg PO daily daily.                    - Mycelex (clotrimazole) 10 mg lozenge 5x/day for  1 week.                    - Repeat the upper endoscopy in 4 weeks to check healing. Procedure Code(s): --- Professional ---                    [phone], Esophagogastroduodenoscopy, flexible, transoral;                     diagnostic, including collection of specimen(s) by                     brushing or washing, when performed (separate procedure) Diagnosis Code(s): --- Professional ---                    530.19, Other esophagitis                    537.9, Unspecified disorder of stomach and duodenum                    578.0, Hematemesis CPT copyright 2014 American Medical Association. All rights reserved. The codes documented in this report are preliminary and upon coder review may  be revised to meet current compliance requirements. Christena Deem, MD 06/30/2015 2:08:51 PM This report has been signed electronically. Number of Addenda: 0 Note Initiated On: 06/30/2015 1:19 PM      [facility]

## 2015-06-30 NOTE — Anesthesia Preprocedure Evaluation (Addendum)
Anesthesia Evaluation  Patient identified by MRN, date of birth, ID band Patient awake    Reviewed: Allergy & Precautions, H&P , NPO status , Patient's Chart, lab work & pertinent test results  Airway Mallampati: II  TM Distance: >3 FB Neck ROM: full    Dental no notable dental hx. (+) Teeth Intact   Pulmonary neg pulmonary ROS,    Pulmonary exam normal breath sounds clear to auscultation       Cardiovascular hypertension, + Peripheral Vascular Disease  Normal cardiovascular exam Rhythm:regular Rate:Normal     Neuro/Psych negative neurological ROS  negative psych ROS   GI/Hepatic Neg liver ROS, GERD  ,  Endo/Other  diabetes  Renal/GU Renal disease  negative genitourinary   Musculoskeletal   Abdominal   Peds  Hematology negative hematology ROS (+)   Anesthesia Other Findings Past Medical History:   Diabetes mellitus without complication                         Comment:a. Dx ~ 1996.   Essential hypertension                                       CKD (chronic kidney disease), stage III                      GERD (gastroesophageal reflux disease)                       Cellulitis and abscess of foot                                 Comment:Left-Dr. Fowler   Osteomyelitis                                                  Comment:a. 05/2015 L foot.   Left leg DVT                                                   Comment:a. Dx 05/2015 -> Coumadin.   Gastritis                                                      Comment:a. 04/2015 hematemesis -> EGD: gastritis,               esophagitis, duodenitis.  No active bleeding.                PPI added.   Reproductive/Obstetrics negative OB ROS                            Anesthesia Physical Anesthesia Plan  ASA: III  Anesthesia Plan: General   Post-op Pain Management:    Induction:   Airway Management Planned:   Additional Equipment:    Intra-op Plan:   Post-operative  Plan:   Informed Consent: I have reviewed the patients History and Physical, chart, labs and discussed the procedure including the risks, benefits and alternatives for the proposed anesthesia with the patient or authorized representative who has indicated his/her understanding and acceptance.   Dental Advisory Given  Plan Discussed with: Anesthesiologist, CRNA and Surgeon  Anesthesia Plan Comments:         Anesthesia Quick Evaluation

## 2015-06-30 NOTE — Transfer of Care (Signed)
Immediate Anesthesia Transfer of Care Note  Patient: Juan Roberson.  Procedure(s) Performed: Procedure(s): ESOPHAGOGASTRODUODENOSCOPY (EGD) WITH PROPOFOL (Left)  Patient Location: PACU and Endoscopy Unit  Anesthesia Type:MAC  Level of Consciousness: awake, alert  and oriented  Airway & Oxygen Therapy: Patient Spontanous Breathing and Patient connected to nasal cannula oxygen  Post-op Assessment: Report given to RN and Post -op Vital signs reviewed and stable  Post vital signs: Reviewed and stable  Last Vitals:  Filed Vitals:   06/30/15 1315  BP: 186/79  Pulse: 80  Temp: 37.9 C  Resp: 21    Complications: No apparent anesthesia complications

## 2015-06-30 NOTE — Clinical Documentation Improvement (Addendum)
Internal Medicine  Please clarify if sepsis is ruled in or ruled out.  If ruled in please document source of sepsis and treatment.    Supporting Information:  H&P Sepsis: Patient meets criteria via tachycardia and leukocytosis. We will obtain blood cultures however at this time his tachycardia is likely secondary to volume depletion.  HR 9/26 = 135 9/27 = 96 - 155 9/28 = 50 - 51  Blood Cultures: No growth in 1 day as of 9/27  Component     Latest Ref Rng 06/28/2015 06/29/2015 06/30/2015  WBC     3.8 - 10.6 K/uL 15.6 (H) 8.8 8.6   Continued home med Cipro  bid po Started 9/27 Doxycycline  q12h po  Please exercise your independent, professional judgment when responding. A specific answer is not anticipated or expected.   Thank Angelene Giovanni [employer] Information Management [place] [phone]    REPLY: Sepsis ruled out.

## 2015-06-30 NOTE — Consult Note (Addendum)
Subjective: Patient seen for hematemesis. Patient has been hemodynamically stable overnight. He is been no recurrent hematemesis. He has had a bowel movement it was not black. He denies any further nausea or abdominal pain.  Objective: Vital signs in last 24 hours: Temp:  [97.9 F (36.6 C)-100.2 F (37.9 C)] 100.2 F (37.9 C) (09/28 1315) Pulse Rate:  [50-107] 64 (09/28 1340) Resp:  [21] 21 (09/28 1315) BP: (139-188)/(78-108) 186/79 mmHg (09/28 1315) SpO2:  [98 %-99 %] 98 % (09/28 1315) Blood pressure 186/79, pulse 80, temperature 100.2 F (37.9 C), temperature source Tympanic, resp. rate 21, height  (1.88 m), weight 87.544 kg (193 lb), SpO2 98 %.   Intake/Output from previous day: 09/27 0701 - 09/28 0700 In: 2869 [I.V.:2869] Out: 600 [Urine:600]  Intake/Output this shift: Total I/O In: 590 [I.V.:590] Out: 400 [Urine:400]   General appearance:  Well-appearing 44 year old male no acute distress Resp:  Clear to auscultation Cardio:  Regular rate and rhythm GI:  Soft nontender nondistended bowel sounds positive normoactive Extremities:     Lab Results: Results for orders placed or performed during the hospital encounter of 06/28/15 (from the past 24 hour(s))  Troponin I (q 6hr x 3)     Status: Abnormal   Collection Time: 06/29/15  3:26 PM  Result Value Ref Range   Troponin I 0.22 (H) <0.031 ng/mL  Glucose, capillary     Status: None   Collection Time: 06/29/15  5:18 PM  Result Value Ref Range   Glucose-Capillary 67 65 - 99 mg/dL  Troponin I (q 6hr x 3)     Status: Abnormal   Collection Time: 06/29/15  9:01 PM  Result Value Ref Range   Troponin I 0.24 (H) <0.031 ng/mL  Glucose, capillary     Status: Abnormal   Collection Time: 06/29/15 10:43 PM  Result Value Ref Range   Glucose-Capillary 307 (H) 65 - 99 mg/dL   Comment 1 Notify RN   CBC     Status: Abnormal   Collection Time: 06/30/15  4:59 AM  Result Value Ref Range   WBC 8.6 3.8 - 10.6 K/uL   RBC 3.99 (L)  4.40 - 5.90 MIL/uL   Hemoglobin 9.7 (L) 13.0 - 18.0 g/dL   HCT 16.1 (L) 09.6 - 04.5 %   MCV 78.7 (L) 80.0 - 100.0 fL   MCH 24.3 (L) 26.0 - 34.0 pg   MCHC 30.9 (L) 32.0 - 36.0 g/dL   RDW 40.9 (H) 81.1 - 91.4 %   Platelets 132 (L) 150 - 440 K/uL  Basic metabolic panel     Status: Abnormal   Collection Time: 06/30/15  4:59 AM  Result Value Ref Range   Sodium 139 135 - 145 mmol/L   Potassium 4.1 3.5 - 5.1 mmol/L   Chloride 108 101 - 111 mmol/L   CO2 22 22 - 32 mmol/L   Glucose, Bld 150 (H) 65 - 99 mg/dL   BUN 33 (H) 6 - 20 mg/dL   Creatinine, Ser 7.82 (H) 0.61 - 1.24 mg/dL   Calcium 8.2 (L) 8.9 - 10.3 mg/dL   GFR calc non Af Amer 42 (L) >60 mL/min   GFR calc Af Amer 48 (L) >60 mL/min   Anion gap 9 5 - 15  Glucose, capillary     Status: Abnormal   Collection Time: 06/30/15  8:23 AM  Result Value Ref Range   Glucose-Capillary 155 (H) 65 - 99 mg/dL  Glucose, capillary     Status: Abnormal  Collection Time: 06/30/15 12:39 PM  Result Value Ref Range   Glucose-Capillary 113 (H) 65 - 99 mg/dL      Recent Labs  16/10/96 2046 06/29/15 1035 06/30/15 0459  WBC 15.6* 8.8 8.6  HGB 11.0* 9.2* 9.7*  HCT 34.7* 29.2* 31.4*  PLT 213 135* 132*   BMET  Recent Labs  06/28/15 2046 06/29/15 1035 06/30/15 0459  NA 144 147* 139  K 3.5 3.8 4.1  CL 110 116* 108  CO2 21* 24 22  GLUCOSE 164* 111* 150*  BUN 36* 38* 33*  CREATININE 1.96* 1.91* 1.86*  CALCIUM 10.1 8.8* 8.2*   LFT  Recent Labs  06/28/15 2046  PROT 7.9  ALBUMIN 4.2  AST 36  ALT 23  ALKPHOS 85  BILITOT 1.4*   PT/INR No results for input(s): LABPROT, INR in the last 72 hours. Hepatitis Panel No results for input(s): HEPBSAG, HCVAB, HEPAIGM, HEPBIGM in the last 72 hours. C-Diff No results for input(s): CDIFFTOX in the last 72 hours. No results for input(s): CDIFFPCR in the last 72 hours.   Studies/Results: US Venous Img Lower Bilateral  06/29/2015   CLINICAL DATA:  Bilateral leg pain. History of left  peroneal vein DVT.  EXAM: BILATERAL LOWER EXTREMITY VENOUS DOPPLER ULTRASOUND  TECHNIQUE: Gray-scale sonography with graded compression, as well as color Doppler and duplex ultrasound were performed to evaluate the lower extremity deep venous systems from the level of the common femoral vein and including the common femoral, femoral, profunda femoral, popliteal and calf veins including the posterior tibial, peroneal and gastrocnemius veins when visible. The superficial great saphenous vein was also interrogated. Spectral Doppler was utilized to evaluate flow at rest and with distal augmentation maneuvers in the common femoral, femoral and popliteal veins.  COMPARISON:  05/13/2015.  FINDINGS: RIGHT LOWER EXTREMITY  Common Femoral Vein: No evidence of thrombus. Normal compressibility, respiratory phasicity and response to augmentation.  Saphenofemoral Junction: No evidence of thrombus. Normal compressibility and flow on color Doppler imaging.  Profunda Femoral Vein: No evidence of thrombus. Normal compressibility and flow on color Doppler imaging.  Femoral Vein: No evidence of thrombus. Normal compressibility, respiratory phasicity and response to augmentation.  Popliteal Vein: No evidence of thrombus. Normal compressibility, respiratory phasicity and response to augmentation.  Calf Veins: Limited evaluation of the right calf veins and difficult to exclude thrombus in some of the calf veins.  LEFT LOWER EXTREMITY  Common Femoral Vein: No evidence of thrombus. Normal compressibility, respiratory phasicity and response to augmentation.  Saphenofemoral Junction: No evidence of thrombus. Normal compressibility and flow on color Doppler imaging.  Profunda Femoral Vein: No evidence of thrombus. Normal compressibility and flow on color Doppler imaging.  Femoral Vein: No evidence of thrombus. Normal compressibility, respiratory phasicity and response to augmentation.  Popliteal Vein: No evidence of thrombus. Normal  compressibility, respiratory phasicity and response to augmentation.  Calf Veins: The left posterior tibial veins are compressible. The anterior peroneal vein is non compressible and appears to be occluded. Findings are similar to the previous examination.  IMPRESSION: Stable thrombus in a left peroneal vein. No other evidence for deep vein thrombosis in the left lower extremity.  Limited evaluation of the right calf veins and difficult to exclude a small thrombus in the right calf. No evidence for deep vein thrombosis in the right lower extremity above the calf.   Electronically Signed   By: Richarda Overlie M.D.   On: 06/29/2015 17:10    Scheduled Inpatient Medications:   . [facility] Hold]  amLODipine  10 mg Oral Daily  . [MAR Hold] ciprofloxacin  500 mg Oral BID  . [MAR Hold] cloNIDine  0.1 mg Oral BID  . [MAR Hold] doxycycline  100 mg Oral Q12H  . [MAR Hold] insulin aspart  0-15 Units Subcutaneous TID WC  . [MAR Hold] insulin glargine  18 Units Subcutaneous QHS  . [MAR Hold] metoprolol tartrate  50 mg Oral BID  . [MAR Hold] pantoprazole (PROTONIX) IV  40 mg Intravenous Q12H  . [MAR Hold] sodium chloride  3 mL Intravenous Q12H    Continuous Inpatient Infusions:   . sodium chloride 150 mL/hr at 06/30/15 1317    PRN Inpatient Medications:  [MAR Hold] acetaminophen, [MAR Hold] hydrALAZINE, [MAR Hold]  morphine injection, [MAR Hold] ondansetron (ZOFRAN) IV, [MAR Hold] promethazine  Miscellaneous:   Assessment:  1. Hematemesis in the setting of recent finding on EGD of severe erosive esophagitis and some gastritis. He did not continue taking proton pump inhibitor as prescribed when he went home from that hospitalization..  Plan:  1. EGD. I have discussed the risks benefits and complications of procedures to include not limited to bleeding, infection, perforation and the risk of sedation and the patient wishes to proceed.  Christena Deem MD 06/30/2015, 1:40 PM  Addendum ProTime was ordered but  not done this morning. She states that he has not taken any Coumadin for at least a month. We'll proceed with EGD. He is taking a 325 mg aspirin daily however this is for her psoriasis in regards to DVT and be safer not to interrupt for several days. I did discuss with the patient that I may not be able do biopsies.

## 2015-07-01 ENCOUNTER — Encounter: Payer: Self-pay | Admitting: Gastroenterology

## 2015-07-01 LAB — BASIC METABOLIC PANEL
ANION GAP: 6 (ref 5–15)
BUN: 31 mg/dL — ABNORMAL HIGH (ref 6–20)
CALCIUM: 8.1 mg/dL — AB (ref 8.9–10.3)
CHLORIDE: 114 mmol/L — AB (ref 101–111)
CO2: 22 mmol/L (ref 22–32)
Creatinine, Ser: 1.72 mg/dL — ABNORMAL HIGH (ref 0.61–1.24)
GFR calc non Af Amer: 46 mL/min — ABNORMAL LOW (ref >60)
GFR, EST AFRICAN AMERICAN: 53 mL/min — AB (ref >60)
Glucose, Bld: 87 mg/dL (ref 65–99)
POTASSIUM: 3.9 mmol/L (ref 3.5–5.1)
Sodium: 142 mmol/L (ref 135–145)

## 2015-07-01 LAB — CBC
HEMATOCRIT: 27.7 % — AB (ref 40.0–52.0)
HEMOGLOBIN: 8.7 g/dL — AB (ref 13.0–18.0)
MCH: 24.4 pg — ABNORMAL LOW (ref 26.0–34.0)
MCHC: 31.5 g/dL — ABNORMAL LOW (ref 32.0–36.0)
MCV: 77.7 fL — ABNORMAL LOW (ref 80.0–100.0)
Platelets: 114 10*3/uL — ABNORMAL LOW (ref 150–440)
RBC: 3.57 MIL/uL — ABNORMAL LOW (ref 4.40–5.90)
RDW: 17.7 % — ABNORMAL HIGH (ref 11.5–14.5)
WBC: 6.3 10*3/uL (ref 3.8–10.6)

## 2015-07-01 LAB — GLUCOSE, CAPILLARY: Glucose-Capillary: 113 mg/dL — ABNORMAL HIGH (ref 65–99)

## 2015-07-01 MED ORDER — PANTOPRAZOLE SODIUM 40 MG PO TBEC: 40.0000 mg | DELAYED_RELEASE_TABLET | Freq: Two times a day (BID) | ORAL | Status: DC

## 2015-07-01 MED ORDER — SUCRALFATE 1 GM/10ML PO SUSP: 1.0000 g | Freq: Three times a day (TID) | ORAL | Status: DC

## 2015-07-01 MED ORDER — METOPROLOL TARTRATE 50 MG PO TABS: 50.0000 mg | ORAL_TABLET | Freq: Two times a day (BID) | ORAL | Status: DC

## 2015-07-01 NOTE — Care Management (Signed)
Spoke with patient prior to discharge.  Patient stated that he is open to [facility] in [place].  Contacted [facility] and spoke with Dorathy Daft, [phone] and faxed discharge summary and resumption of home health orders. [phone].  Patient will be discharge today with Home Health wound care RN.  Amalia Greenhouse WOC stated that wound care orders would be the same as prior to hospital visit.  No other CM needs identified.

## 2015-07-01 NOTE — Consult Note (Signed)
[facility] Vascular Consult Note  MRN : [id number]  Juan Roberson. is a 44 y.o. (02-23-1971) male who presents with chief complaint of  Chief Complaint  Patient presents with  . Emesis  .  History of Present Illness: Patient is admitted with vomiting blood. This has been a worsening problem over several days although he feels much better now after a couple of days of treatment here in the hospital. He had an endoscopy yesterday which showed significant esophagitis and gastritis. About 6 weeks ago, he was started on anticoagulation for a left tibial vein DVT.  His pain and swelling largely resolved.  The bleeding has complicated the situation and it is now necessary to stop his anticoagulation.  Current Facility-Administered Medications  Medication Dose Route Frequency Provider Last Rate Last Dose  . 0.9 %  sodium chloride infusion   Intravenous Continuous Arnaldo Natal, MD 150 mL/hr at 07/01/15 0255    . acetaminophen (TYLENOL) tablet 650 mg  650 mg Oral Q6H PRN Wyatt Haste, MD   650 mg at 06/29/15 2349  . amLODipine (NORVASC) tablet 10 mg  10 mg Oral Daily Enid Baas, MD   10 mg at 06/30/15 1650  . ciprofloxacin (CIPRO) tablet 500 mg  500 mg Oral BID Enid Baas, MD   500 mg at 06/30/15 2005  . cloNIDine (CATAPRES) tablet 0.1 mg  0.1 mg Oral BID Enid Baas, MD   0.1 mg at 06/30/15 2127  . clotrimazole (MYCELEX) troche 10 mg  10 mg Oral 5 X Daily Midge Minium, MD   10 mg at 07/01/15 0539  . doxycycline (VIBRA-TABS) tablet 100 mg  100 mg Oral Q12H Enid Baas, MD   100 mg at 06/30/15 2128  . hydrALAZINE (APRESOLINE) injection 10 mg  10 mg Intravenous Q4H PRN Wyatt Haste, MD   10 mg at 06/30/15 1256  . insulin aspart (novoLOG) injection 0-15 Units  0-15 Units Subcutaneous TID WC Arnaldo Natal, MD   3 Units at 06/30/15 [phone]  . insulin glargine (LANTUS) injection 18 Units  18 Units Subcutaneous QHS Arnaldo Natal, MD   18 Units  at 06/30/15 2130  . metoprolol (LOPRESSOR) tablet 50 mg  50 mg Oral BID Enid Baas, MD   50 mg at 06/30/15 2127  . morphine 2 MG/ML injection 2 mg  2 mg Intravenous Q4H PRN Arnaldo Natal, MD   2 mg at 06/29/15 2144  . ondansetron (ZOFRAN) injection 4 mg  4 mg Intravenous Q4H PRN Wyatt Haste, MD   4 mg at 06/30/15 1650  . pantoprazole (PROTONIX) injection 40 mg  40 mg Intravenous Q12H Christena Deem, MD   40 mg at 06/30/15 2132  . promethazine (PHENERGAN) injection 12.5 mg  12.5 mg Intravenous Q4H PRN Wyatt Haste, MD   12.5 mg at 06/30/15 0737  . sodium chloride 0.9 % injection 3 mL  3 mL Intravenous Q12H Arnaldo Natal, MD   3 mL at 06/29/15 2144  . sucralfate (CARAFATE) 1 GM/10ML suspension 1 g  1 g Oral TID WC & HS Christena Deem, MD   1 g at 06/30/15 2128    Past Medical History  Diagnosis Date  . Diabetes mellitus without complication     a. Dx ~ 1996.  . Essential hypertension   . CKD (chronic kidney disease), stage III   . GERD (gastroesophageal reflux disease)   . Cellulitis and abscess of foot  Left-Dr. Ether Griffins  . Osteomyelitis     a. 05/2015 L foot.  . Left leg DVT     a. Dx 05/2015 -> Coumadin.  . Gastritis     a. 04/2015 hematemesis -> EGD: gastritis, esophagitis, duodenitis.  No active bleeding.  PPI added.    Past Surgical History  Procedure Laterality Date  . No past surgeries    . I&d extremity Left 04/06/2015    Procedure: IRRIGATION AND DEBRIDEMENT EXTREMITY;  Surgeon: Gwyneth Revels, DPM;  Location: [facility] ORS;  Service: Podiatry;  Laterality: Left;  . Esophagogastroduodenoscopy N/A 04/07/2015    Procedure: ESOPHAGOGASTRODUODENOSCOPY (EGD);  Surgeon: Midge Minium, MD;  Location: [facility] ENDOSCOPY;  Service: Endoscopy;  Laterality: N/A;  . Foot surgery    . Irrigation and debridement foot Left 05/21/2015    Procedure: IRRIGATION AND DEBRIDEMENT FOOT;  Surgeon: Linus Galas, MD;  Location: [facility] ORS;  Service: Podiatry;  Laterality: Left;    Social  History Social History  Substance Use Topics  . Smoking status: Never Smoker   . Smokeless tobacco: None  . Alcohol Use: No  No IVDU  Family History Family History  Problem Relation Age of Onset  . Coronary artery disease Father   . Pancreatic cancer Mother   . Breast cancer Sister   . Lung cancer Brother   . Pancreatic cancer Mother     Allergies  Allergen Reactions  . Sulfur Other (See Comments)    Renal failure     REVIEW OF SYSTEMS (Negative unless checked)  Constitutional: [] Weight loss  [] Fever  [] Chills Cardiac: [] Chest pain   [] Chest pressure   [] Palpitations   [] Shortness of breath when laying flat   [] Shortness of breath at rest   [] Shortness of breath with exertion. Vascular:  [] Pain in legs with walking   [] Pain in legs at rest   [] Pain in legs when laying flat   [] Claudication   [] Pain in feet when walking  [] Pain in feet at rest  [] Pain in feet when laying flat   [] History of DVT   [x] Phlebitis   [] Swelling in legs   [] Varicose veins   [] Non-healing ulcers Pulmonary:   [] Uses home oxygen   [] Productive cough   [] Hemoptysis   [] Wheeze  [] COPD   [] Asthma Neurologic:  [] Dizziness  [] Blackouts   [] Seizures   [] History of stroke   [] History of TIA  [] Aphasia   [] Temporary blindness   [] Dysphagia   [] Weakness or numbness in arms   [] Weakness or numbness in legs Musculoskeletal:  [] Arthritis   [] Joint swelling   [] Joint pain   [] Low back pain Hematologic:  [] Easy bruising  [] Easy bleeding   [] Hypercoagulable state   [x] Anemic  [] Hepatitis Gastrointestinal:  [] Blood in stool   [x] Vomiting blood  [] Gastroesophageal reflux/heartburn   [] Difficulty swallowing. Genitourinary:  [x] Chronic kidney disease   [] Difficult urination  [] Frequent urination  [] Burning with urination   [] Blood in urine Skin:  [] Rashes   [] Ulcers   [] Wounds Psychological:  [] History of anxiety   []  History of major depression.  Physical Examination  Filed Vitals:   06/30/15 1546 06/30/15 2113 06/30/15  2113 07/01/15 0415  BP: 176/96 166/86  126/72  Pulse: 71 55 56 82  Temp: 98.4 F (36.9 C) 99.3 F (37.4 C)  98.6 F (37 C)  TempSrc: Oral Oral  Oral  Resp: 16 18  17   Height:      Weight:    89.041 kg (196 lb 4.8 oz)  SpO2: 99% 100% 99% 99%   Body  mass index is 25.19 kg/(m^2). Gen:  WD/WN, NAD Head: [place]/AT, No temporalis wasting. Prominent temp pulse not noted. Ear/Nose/Throat: Hearing grossly intact, nares w/o erythema or drainage, oropharynx w/o Erythema/Exudate Eyes: PERRLA, EOMI.  Neck: Supple, no nuchal rigidity.  No bruit or JVD.  Pulmonary:  Good air movement, no use of accessory muscles Cardiac: RRR, normal S1, S2,  Vascular:  Vessel Right Left  Radial Palpable Palpable  Ulnar Palpable Palpable  Brachial Palpable Palpable  Carotid Palpable, without bruit Palpable, without bruit  Aorta Not palpable N/A  Femoral Palpable Palpable  Popliteal Palpable Palpable  PT Palpable Palpable  DP Palpable Palpable   Gastrointestinal: soft, non-tender/non-distended. No guarding/reflex. No masses, surgical incisions, or scars. Musculoskeletal: M/S 5/5 throughout.  Extremities without ischemic changes.  No deformity or atrophy. No edema. Neurologic: CN 2-12 intact. Pain and light touch intact in extremities.  Symmetrical.  Speech is fluent. Motor exam as listed above. Psychiatric: Judgment intact, Mood & affect appropriate for pt's clinical situation. Dermatologic: No rashes or ulcers noted.  No cellulitis or open wounds. Lymph : No Cervical, Axillary, or Inguinal lymphadenopathy.      CBC Lab Results  Component Value Date   WBC 6.3 07/01/2015   HGB 8.7* 07/01/2015   HCT 27.7* 07/01/2015   MCV 77.7* 07/01/2015   PLT 114* 07/01/2015    BMET    Component Value Date/Time   NA 142 07/01/2015 0402   NA 140 09/18/2014 1003   K 3.9 07/01/2015 0402   K 5.6* 09/18/2014 1003   CL 114* 07/01/2015 0402   CL 111* 09/18/2014 1003   CO2 22 07/01/2015 0402   CO2 22 09/18/2014  1003   GLUCOSE 87 07/01/2015 0402   GLUCOSE 270* 09/18/2014 1003   BUN 31* 07/01/2015 0402   BUN 52* 09/18/2014 1003   CREATININE 1.72* 07/01/2015 0402   CREATININE 2.50* 09/18/2014 1003   CALCIUM 8.1* 07/01/2015 0402   CALCIUM 9.0 09/18/2014 1003   GFRNONAA 46* 07/01/2015 0402   GFRNONAA 30* 09/18/2014 1003   GFRNONAA 38* 03/31/2014 2147   GFRAA 53* 07/01/2015 0402   GFRAA 36* 09/18/2014 1003   GFRAA 44* 03/31/2014 2147   Estimated Creatinine Clearance: 62.4 mL/min (by C-G formula based on Cr of 1.72).  COAG Lab Results  Component Value Date   INR 1.32 06/30/2015   INR 2.08 05/19/2015   INR 1.0 07/26/2014    Radiology US Venous Img Lower Bilateral  06/29/2015   CLINICAL DATA:  Bilateral leg pain. History of left peroneal vein DVT.  EXAM: BILATERAL LOWER EXTREMITY VENOUS DOPPLER ULTRASOUND  TECHNIQUE: Gray-scale sonography with graded compression, as well as color Doppler and duplex ultrasound were performed to evaluate the lower extremity deep venous systems from the level of the common femoral vein and including the common femoral, femoral, profunda femoral, popliteal and calf veins including the posterior tibial, peroneal and gastrocnemius veins when visible. The superficial great saphenous vein was also interrogated. Spectral Doppler was utilized to evaluate flow at rest and with distal augmentation maneuvers in the common femoral, femoral and popliteal veins.  COMPARISON:  05/13/2015.  FINDINGS: RIGHT LOWER EXTREMITY  Common Femoral Vein: No evidence of thrombus. Normal compressibility, respiratory phasicity and response to augmentation.  Saphenofemoral Junction: No evidence of thrombus. Normal compressibility and flow on color Doppler imaging.  Profunda Femoral Vein: No evidence of thrombus. Normal compressibility and flow on color Doppler imaging.  Femoral Vein: No evidence of thrombus. Normal compressibility, respiratory phasicity and response to augmentation.  Popliteal Vein:  No  evidence of thrombus. Normal compressibility, respiratory phasicity and response to augmentation.  Calf Veins: Limited evaluation of the right calf veins and difficult to exclude thrombus in some of the calf veins.  LEFT LOWER EXTREMITY  Common Femoral Vein: No evidence of thrombus. Normal compressibility, respiratory phasicity and response to augmentation.  Saphenofemoral Junction: No evidence of thrombus. Normal compressibility and flow on color Doppler imaging.  Profunda Femoral Vein: No evidence of thrombus. Normal compressibility and flow on color Doppler imaging.  Femoral Vein: No evidence of thrombus. Normal compressibility, respiratory phasicity and response to augmentation.  Popliteal Vein: No evidence of thrombus. Normal compressibility, respiratory phasicity and response to augmentation.  Calf Veins: The left posterior tibial veins are compressible. The anterior peroneal vein is non compressible and appears to be occluded. Findings are similar to the previous examination.  IMPRESSION: Stable thrombus in a left peroneal vein. No other evidence for deep vein thrombosis in the left lower extremity.  Limited evaluation of the right calf veins and difficult to exclude a small thrombus in the right calf. No evidence for deep vein thrombosis in the right lower extremity above the calf.   Electronically Signed   By: Richarda Overlie M.D.   On: 06/29/2015 17:10     Assessment/Plan 1. Erosive esophagitis with bleeding.  Agree with PPI and holding anticoagulation 2. Tibial vein DVT. This is a very low risk DVT and has artery gotten over a month of treatment with full anticoagulation. It is a debatable issue on whether or not to treat these tibial vein DVTs with full anticoagulation anyway, so I would recommend stopping his anticoagulation and using aspirin therapy when felt safe by the gastroenterology service. I do not believe he needs an IVC filter given the low risk nature of these tibial vein DVT's and the fact  that it is already gotten several weeks of treatment. I would recommend a follow-up duplex in about 10-14 days to assess for progression. Progression of the DVT with inability to anticoagulate would be an indication for IVC filter placement. 3. Chronic kidney disease. Stable. 4. Diabetes. Approximate 20 years. Stable.   DEW,JASON, MD  07/01/2015 9:25 AM

## 2015-07-01 NOTE — Progress Notes (Signed)
Alert and oriented. VSS. No signs of acute distress. Discharge instructions given. Patient verbalized understanding. 

## 2015-07-01 NOTE — Discharge Summary (Signed)
[facility] [place] at [facility]   PATIENT NAME: Juan Roberson    MR#:  [id number]  DATE OF BIRTH:  [birth year]  DATE OF ADMISSION:  06/28/2015 ADMITTING PHYSICIAN: Arnaldo Natal, MD  DATE OF DISCHARGE: 07/01/2015 PRIMARY CARE PHYSICIAN: MASOUD,JAVED, MD    ADMISSION DIAGNOSIS:  Upper GI bleed [K92.2]  DISCHARGE DIAGNOSIS:  Active Problems:   GI bleed   SECONDARY DIAGNOSIS:   Past Medical History  Diagnosis Date  . Diabetes mellitus without complication     a. Dx ~ 1996.  . Essential hypertension   . CKD (chronic kidney disease), stage III   . GERD (gastroesophageal reflux disease)   . Cellulitis and abscess of foot     Left-Dr. Ether Griffins  . Osteomyelitis     a. 05/2015 L foot.  . Left leg DVT     a. Dx 05/2015 -> Coumadin.  . Gastritis     a. 04/2015 hematemesis -> EGD: gastritis, esophagitis, duodenitis.  No active bleeding.  PPI added.    HOSPITAL COURSE:  44 year old male with past medical history significant for insulin-dependent diabetes mellitus, CK D stage III, history of osteomyelitis of left foot, history of DVT presents to the hospital secondary to hematemesis.  #1 Hematemesis-his EGD shows small ulcers and severe esophagitis. He will continue BID PPI and carafate with follow up in 4 weeks. He will avoid NSAIDS for now.   #2 elevated troponins-demand ischemia from blood pressure being elevated. -No chest pain, no EKG changes. -He would benefit from  outpatient stress test due to h/o DM and HTN.  #3 history of left foot osteomyelitis-l. On oral ciprofloxacin and doxycycline as outpatient. #4 hypertensive urgency-blood pressure has improved. He was restarted on his outpatient medications and metoprol was added for better BP control  #5 insulin-dependent diabetes mellitus-on Lantus #6 left peroneal DVT- as per Dr Wyn Quaker, he needs a repeat ultrasound in 1-2 weeks. We have made referral and follow up for this.   #6 left heel wound  continue Wound care to full thickness wound on plantar aspect of left foot:  Irrigate defect with NS, use cotton tipped applicator inserted gently into defect to dry.  Apply hydrogel [place] [phone]) to defect using cotton tipped applicator.  Top with dry 2x2s, then 4x4s and secure with Kerlix roll gauze.  Apply 4-inch ACE wrap on top of kerlix roll gauze.  Change daily.  DISCHARGE CONDITIONS AND DIET:  Stable condition On low residue diet for 4 days then heart healthy  CONSULTS OBTAINED:  Treatment Team:  Christena Deem, MD Annice Needy, MD  DRUG ALLERGIES:   Allergies  Allergen Reactions  . Sulfur Other (See Comments)    Renal failure    DISCHARGE MEDICATIONS:   Current Discharge Medication List    START taking these medications   Details  metoprolol (LOPRESSOR) 50 MG tablet Take 1 tablet (50 mg total) by mouth 2 (two) times daily. Qty: 60 tablet, Refills: 0    sucralfate (CARAFATE) 1 GM/10ML suspension Take 10 mLs (1 g total) by mouth 4 (four) times daily -  with meals and at bedtime. Qty: 420 mL, Refills: 0      CONTINUE these medications which have CHANGED   Details  pantoprazole (PROTONIX) 40 MG tablet Take 1 tablet (40 mg total) by mouth 2 (two) times daily. Qty: 30 tablet, Refills: 0      CONTINUE these medications which have NOT CHANGED   Details  ciprofloxacin (CIPRO) 500 MG tablet Take  1 tablet by mouth 2 (two) times daily.    cloNIDine (CATAPRES) 0.2 MG tablet Take 0.2 mg by mouth 2 (two) times daily.  Refills: 4    doxycycline (VIBRAMYCIN) 100 MG capsule Take 1 capsule by mouth 2 (two) times daily.    oxyCODONE (OXY IR/ROXICODONE) 5 MG immediate release tablet Take 1 tablet (5 mg total) by mouth every 4 (four) hours as needed for moderate pain. Qty: 30 tablet, Refills: 0    TRESIBA FLEXTOUCH 200 UNIT/ML SOPN Inject 25 Units into the skin daily.  Refills: 5    albuterol (PROVENTIL) (2.5 MG/3ML) 0.083% nebulizer solution Take 3 mLs (2.5 mg total) by  nebulization every 2 (two) hours as needed for wheezing. Qty: 75 mL, Refills: 12    amLODipine (NORVASC) 10 MG tablet Take 1 tablet (10 mg total) by mouth daily. Qty: 30 tablet, Refills: 0    atorvastatin (LIPITOR) 80 MG tablet Take 1 tablet (80 mg total) by mouth daily at 6 PM. Qty: 30 tablet, Refills: 3    insulin aspart (NOVOLOG) 100 UNIT/ML injection Inject 0-9 Units into the skin 3 (three) times daily with meals. Qty: 10 mL, Refills: 11    senna-docusate (SENOKOT-S) 8.6-50 MG per tablet Take 2 tablets by mouth 2 (two) times daily. Qty: 60 tablet, Refills: 2      STOP taking these medications     aspirin EC 325 MG EC tablet      DAPTOmycin 500 mg in sodium chloride 0.9 % 100 mL               Today   CHIEF COMPLAINT:  Doing well this am no hematemesis or melena   VITAL SIGNS:  Blood pressure 126/72, pulse 82, temperature 98.6 F (37 C), temperature source Oral, resp. rate 17, height  (1.88 m), weight 89.041 kg (196 lb 4.8 oz), SpO2 99 %.   REVIEW OF SYSTEMS:  Review of Systems  Constitutional: Negative for fever, chills and malaise/fatigue.  HENT: Negative for sore throat.   Eyes: Negative for blurred vision.  Respiratory: Negative for cough, hemoptysis, shortness of breath and wheezing.   Cardiovascular: Negative for chest pain, palpitations and leg swelling.  Gastrointestinal: Negative for nausea, vomiting, abdominal pain, diarrhea and blood in stool.  Genitourinary: Negative for dysuria.  Musculoskeletal: Negative for back pain.  Neurological: Negative for dizziness, tremors and headaches.  Endo/Heme/Allergies: Does not bruise/bleed easily.     PHYSICAL EXAMINATION:  GENERAL:  44 y.o.-year-old patient lying in the bed with no acute distress.  NECK:  Supple, no jugular venous distention. No thyroid enlargement, no tenderness.  LUNGS: Normal breath sounds bilaterally, no wheezing, rales,rhonchi  No use of accessory muscles of respiration.   CARDIOVASCULAR: S1, S2 normal. No murmurs, rubs, or gallops.  ABDOMEN: Soft, non-tender, non-distended. Bowel sounds present. No organomegaly or mass.  EXTREMITIES: No pedal edema, cyanosis, or clubbing.  PSYCHIATRIC: The patient is alert and oriented x 3.  SKIN: left foot wound  DATA REVIEW:   CBC  Recent Labs Lab 07/01/15 0402  WBC 6.3  HGB 8.7*  HCT 27.7*  PLT 114*    Chemistries   Recent Labs Lab 06/28/15 2046  07/01/15 0402  NA 144  < > 142  K 3.5  < > 3.9  CL 110  < > 114*  CO2 21*  < > 22  GLUCOSE 164*  < > 87  BUN 36*  < > 31*  CREATININE 1.96*  < > 1.72*  CALCIUM 10.1  < >  8.1*  AST 36  --   --   ALT 23  --   --   ALKPHOS 85  --   --   BILITOT 1.4*  --   --   < > = values in this interval not displayed.  Cardiac Enzymes  Recent Labs Lab 06/28/15 2046 06/29/15 1526 06/29/15 2101  TROPONINI 0.08* 0.22* 0.24*    Microbiology Results  @[email or web address] @  RADIOLOGY:  US Venous Img Lower Bilateral  06/29/2015   CLINICAL DATA:  Bilateral leg pain. History of left peroneal vein DVT.  EXAM: BILATERAL LOWER EXTREMITY VENOUS DOPPLER ULTRASOUND  TECHNIQUE: Gray-scale sonography with graded compression, as well as color Doppler and duplex ultrasound were performed to evaluate the lower extremity deep venous systems from the level of the common femoral vein and including the common femoral, femoral, profunda femoral, popliteal and calf veins including the posterior tibial, peroneal and gastrocnemius veins when visible. The superficial great saphenous vein was also interrogated. Spectral Doppler was utilized to evaluate flow at rest and with distal augmentation maneuvers in the common femoral, femoral and popliteal veins.  COMPARISON:  05/13/2015.  FINDINGS: RIGHT LOWER EXTREMITY  Common Femoral Vein: No evidence of thrombus. Normal compressibility, respiratory phasicity and response to augmentation.  Saphenofemoral Junction: No evidence of thrombus. Normal compressibility  and flow on color Doppler imaging.  Profunda Femoral Vein: No evidence of thrombus. Normal compressibility and flow on color Doppler imaging.  Femoral Vein: No evidence of thrombus. Normal compressibility, respiratory phasicity and response to augmentation.  Popliteal Vein: No evidence of thrombus. Normal compressibility, respiratory phasicity and response to augmentation.  Calf Veins: Limited evaluation of the right calf veins and difficult to exclude thrombus in some of the calf veins.  LEFT LOWER EXTREMITY  Common Femoral Vein: No evidence of thrombus. Normal compressibility, respiratory phasicity and response to augmentation.  Saphenofemoral Junction: No evidence of thrombus. Normal compressibility and flow on color Doppler imaging.  Profunda Femoral Vein: No evidence of thrombus. Normal compressibility and flow on color Doppler imaging.  Femoral Vein: No evidence of thrombus. Normal compressibility, respiratory phasicity and response to augmentation.  Popliteal Vein: No evidence of thrombus. Normal compressibility, respiratory phasicity and response to augmentation.  Calf Veins: The left posterior tibial veins are compressible. The anterior peroneal vein is non compressible and appears to be occluded. Findings are similar to the previous examination.  IMPRESSION: Stable thrombus in a left peroneal vein. No other evidence for deep vein thrombosis in the left lower extremity.  Limited evaluation of the right calf veins and difficult to exclude a small thrombus in the right calf. No evidence for deep vein thrombosis in the right lower extremity above the calf.   Electronically Signed   By: Richarda Overlie M.D.   On: 06/29/2015 17:10      Management plans discussed with the patient and he is in agreement. Stable for discharge home  Patient should follow up with PCP in 1 week  CODE STATUS:     Code Status Orders        Start     Ordered   06/29/15 0231  Full code   Continuous     06/29/15 0230       TOTAL TIME TAKING CARE OF THIS PATIENT: 35 minutes.    MODY, SITAL M.D on 07/01/2015 at 10:25 AM  Between 7am to 6pm - Pager - [phone] After 6pm go to [email or web address] - password [email or web address] [facility]  [place]  Office  [phone]  CC:  Primary care physician; Corky Downs, MD

## 2015-07-01 NOTE — Discharge Instructions (Signed)
Gastrointestinal Bleeding °Gastrointestinal bleeding is bleeding somewhere along the path that food travels through the body (digestive tract). This path is anywhere between the mouth and the opening of the butt (anus). You may have blood in your throw up (vomit) or in your poop (stools). If there is a lot of bleeding, you may need to stay in the hospital. °HOME CARE °· Only take medicine as told by your doctor. °· Eat foods with fiber such as whole grains, fruits, and vegetables. You can also try eating 1 to 3 prunes a day. °· Drink enough fluids to keep your pee (urine) clear or pale yellow. °GET HELP RIGHT AWAY IF:  °· Your bleeding gets worse. °· You feel dizzy, weak, or you pass out (faint). °· You have bad cramps in your back or belly (abdomen). °· You have large blood clumps (clots) in your poop. °· Your problems are getting worse. °MAKE SURE YOU:  °· Understand these instructions. °· Will watch your condition. °· Will get help right away if you are not doing well or get worse. °Document Released: 06/27/2008 Document Revised: 09/04/2012 Document Reviewed: 08/28/2011 °[employer]® Patient Information ©2015 [employer]. This information is not intended to replace advice given to you by your health care provider. Make sure you discuss any questions you have with your health care provider. ° ° °

## 2015-07-04 LAB — CULTURE, BLOOD (ROUTINE X 2)
Culture: NO GROWTH
Culture: NO GROWTH

## 2015-07-22 ENCOUNTER — Encounter: Payer: Worker's Compensation | Attending: Surgery | Admitting: Surgery

## 2015-07-22 DIAGNOSIS — L97524 Non-pressure chronic ulcer of other part of left foot with necrosis of bone: Secondary | ICD-10-CM | POA: Insufficient documentation

## 2015-07-22 DIAGNOSIS — D649 Anemia, unspecified: Secondary | ICD-10-CM | POA: Insufficient documentation

## 2015-07-22 DIAGNOSIS — G4730 Sleep apnea, unspecified: Secondary | ICD-10-CM | POA: Diagnosis not present

## 2015-07-22 DIAGNOSIS — I10 Essential (primary) hypertension: Secondary | ICD-10-CM | POA: Insufficient documentation

## 2015-07-22 DIAGNOSIS — M86372 Chronic multifocal osteomyelitis, left ankle and foot: Secondary | ICD-10-CM | POA: Insufficient documentation

## 2015-07-22 DIAGNOSIS — E11621 Type 2 diabetes mellitus with foot ulcer: Secondary | ICD-10-CM | POA: Insufficient documentation

## 2015-07-22 NOTE — Progress Notes (Addendum)
BLANCHARD, WILLHITE ([id number]) Visit Report for 07/22/2015 Chief Complaint Document Details Patient Name: LUIZ, TRUMPOWER. Date of Service: 07/22/2015 10:15 AM Medical Record Number: [id number] Patient Account Number: [id number] Date of Birth/Sex: 1971-05-29 (44 y.o. Male) Treating RN: Huel Coventry Primary Care Physician: Corky Downs Other Clinician: Referring Physician: Treating Physician/Extender: Rudene Re in Treatment: 0 Information Obtained from: Patient Chief Complaint Patient presents to the wound care center for a consult due non healing wound the patient has had a nonhealing wound on the left plantar aspect of the foot since July 2016. Electronic Signature(s) Signed: 07/22/2015 11:56:06 AM By: Evlyn Kanner MD, FACS Entered By: Evlyn Kanner on 07/22/2015 11:56:06 Sans, Brandy Hale ([id number]) -------------------------------------------------------------------------------- HPI Details Patient Name: [name] Date of Service: 07/22/2015 10:15 AM Medical Record Number: [id number] Patient Account Number: [id number] Date of Birth/Sex: Oct 21, 1970 (44 y.o. Male) Treating RN: Huel Coventry Primary Care Physician: Corky Downs Other Clinician: Referring Physician: Treating Physician/Extender: Rudene Re in Treatment: 0 History of Present Illness Location: open wound on the plantar aspect of his left foot Quality: Patient reports No Pain. Severity: Wound mildly severe with no infection and no exposed structure. 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 Modifying Factors: Other treatment(s) tried include: infectious disease and podiatry for seeing him on a regular basis. Associated Signs and Symptoms: Patient reports having increase discharge. HPI  Description: 44 year old gentleman who is nondiabetic had an injury to his left foot in June 2016 and ultimately had a IandD in early July for an abscess on the left foot. He was on oral antibiotics for a while and then had recurrent problems with on August 19 was taken up by Dr. Graciela Husbands for an IandD of the abscess of the left forefoot with debridement of the infected bone of the proximal phalanx of the left third toe due to osteomyelitis. MRI done at that stage showed osteomyelitis of the proximal phalanx of the third toe with possible osteomyelitis involving the heads of the second third and fourth metatarsal with a phlegmon and abscess. Since then he was seen by the infectious disease Dr. Sampson Goon who put him on daptomycin IV for 4 weeks with a PICC line and oral ciprofloxacin. At the present time he is on clindamycin orally 300 mg 3 times a day and ciprofloxacin 300 mg twice daily. His wound care is done with and a separate on a daily basis. past medical history significant for diabetes mellitus, essential hypertension, chronic kidney disease, GERD, left leg DVT. He does have any recent x-rays of his foot [facility] Dr. Odessa Fleming office and these do not show any definite evidence of further osteomyelitis. Electronic Signature(s) Signed: 07/22/2015 11:57:56 AM By: Evlyn Kanner MD, FACS Previous Signature: 07/22/2015 10:59:43 AM Version By: Evlyn Kanner MD, FACS Entered By: Evlyn Kanner on 07/22/2015 11:57:56 Fehrenbach, Brandy Hale ([id number]) -------------------------------------------------------------------------------- Physical Exam Details Patient Name: [name] Date of Service: 07/22/2015 10:15 AM Medical Record Number: [id number] Patient Account Number: [id number] Date of Birth/Sex: [birth year] (44 y.o. Male) Treating RN: Huel Coventry Primary Care Physician: Corky Downs Other Clinician: Referring Physician: Treating Physician/Extender: Rudene Re in Treatment:  0 Constitutional . Pulse regular. Respirations normal and unlabored. Afebrile. . Eyes Nonicteric. Reactive to light. Ears, Nose, Mouth, and Throat Lips, teeth, and gums WNL.[name]  Moist mucosa without lesions . Neck supple and nontender. No palpable supraclavicular or cervical adenopathy. Normal sized without goiter. Respiratory WNL. No retractions.. Cardiovascular Pedal Pulses WNL. No clubbing, cyanosis or edema. Gastrointestinal (GI) Abdomen without masses or tenderness.. No liver or spleen enlargement or tenderness.. Lymphatic No adneopathy. No adenopathy. No adenopathy. Musculoskeletal Adexa without tenderness or enlargement.. Digits and nails w/o clubbing, cyanosis, infection, petechiae, ischemia, or inflammatory conditions.. Integumentary (Hair, Skin) No suspicious lesions. No crepitus or fluctuance. No peri-wound warmth or erythema. No masses.[name] Psychiatric Judgement and insight Intact.. No evidence of depression, anxiety, or agitation.. Notes the wound probes down to about 3-1/2 cm at about the 7:00 position depth is about 2.5 cm and at about 12:00. It undermines to put 2.5 cm. There was a serous sanguinous fluid pocket at the depths of it. There is no evidence of cellulitis either on the plantar dorsum of the foot. Electronic Signature(s) Signed: 07/22/2015 11:58:46 AM By: Evlyn Kanner MD, FACS Entered By: Evlyn Kanner on 07/22/2015 11:58:46 Luther Bradley ([id number]) -------------------------------------------------------------------------------- Physician Orders Details Patient Name: [name] Date of Service: 07/22/2015 10:15 AM Medical Record Number: [id number] Patient Account Number: [id number] Date of Birth/Sex: Feb 07, 1971 (44 y.o. Male) Treating RN: Huel Coventry Primary Care Physician: Corky Downs Other Clinician: Referring Physician: Treating Physician/Extender: Rudene Re in Treatment: 0 Verbal / Phone Orders: Yes Clinician: Huel Coventry Read  Back and Verified: Yes Diagnosis Coding Wound Cleansing Wound #1 Left,Plantar Metatarsal head fourth o Clean wound with Normal Saline. Anesthetic Wound #1 Left,Plantar Metatarsal head fourth o Topical Lidocaine 4% cream applied to wound bed prior to debridement Primary Wound Dressing Wound #1 Left,Plantar Metatarsal head fourth o Iodoform packing Gauze - packed into wound 3cm at 7:00; 2.5 at 12:00 Secondary Dressing o Gauze and Kerlix/Conform Dressing Change Frequency Wound #1 Left,Plantar Metatarsal head fourth o Change dressing every day. Follow-up Appointments Wound #1 Left,Plantar Metatarsal head fourth o Return Appointment in 1 week. Off-Loading Wound #1 Left,Plantar Metatarsal head fourth o Other: - Front off-loader Additional Orders / Instructions Wound #1 Left,Plantar Metatarsal head fourth o Increase protein intake. Hyperbaric Oxygen Therapy Wound #1 Left,Plantar Metatarsal head fourth o Evaluate for HBO Therapy o Indication: - Osteomyelitis Elders, Apolinar L. ([id number]) Medications-please add to medication list. Wound #1 Left,Plantar Metatarsal head fourth o P.O. Antibiotics - continue prescribed antibiotics Electronic Signature(s) Signed: 07/22/2015 4:31:29 PM By: Evlyn Kanner MD, FACS Signed: 07/22/2015 5:46:49 PM By: Elliot Gurney RN, BSN, Kim RN, BSN Entered By: Elliot Gurney, RN, BSN, Kim on 07/22/2015 11:53:49 Hush, Brandy Hale ([id number]) -------------------------------------------------------------------------------- Problem List Details Patient Name: [name] Date of Service: 07/22/2015 10:15 AM Medical Record Number: [id number] Patient Account Number: [id number] Date of Birth/Sex: 1971/08/12 (43 y.o. 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 Date of Service: 07/22/2015 10:15 AM Medical Record Number: [id number] Patient Account Number: [id number] Date of Birth/Sex: 1971-05-31 (44 y.o. Male) Treating RN: Huel Coventry Primary Care Physician: Corky Downs Other Clinician: Referring  Physician: Treating Physician/Extender: Rudene ReBritto, Mavi Un Weeks in Treatment: 0 Subjective Chief Complaint Information obtained from Patient Patient presents to the wound care center for a consult due non healing wound the patient has had a nonhealing wound on the left plantar aspect of the foot since July 2016. History of Present Illness (HPI) The following HPI elements were documented for the patient's wound: Location: open wound on the plantar aspect of his left foot Quality: Patient reports No Pain. Severity: Wound mildly severe with no infection and no exposed structure. 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 Modifying Factors: Other treatment(s) tried include: infectious disease and podiatry for seeing him on a regular basis. Associated Signs and Symptoms: Patient reports having increase discharge. 44 year old gentleman who is nondiabetic had an injury to  his left foot in June 2016 and ultimately had a IandD in early July for an abscess on the left foot. He was on oral antibiotics for a while and then had recurrent problems with on August 19 was taken up by Dr. Graciela HusbandsKlein for an IandD of the abscess of the left forefoot with debridement of the infected bone of the proximal phalanx of the left third toe due to osteomyelitis. MRI done at that stage showed osteomyelitis of the proximal phalanx of the third toe with possible osteomyelitis involving the heads of the second third and fourth metatarsal with a phlegmon and abscess. Since then he was seen by the infectious disease Dr. Sampson GoonFitzgerald who put him on daptomycin IV for 4 weeks with a PICC line and oral ciprofloxacin. At the present time he is on clindamycin orally 300 mg 3 times a day and ciprofloxacin 300 mg twice daily. His wound care is done with and a separate on a daily basis. past medical history significant for diabetes mellitus, essential hypertension, chronic kidney disease, GERD, left leg DVT. He does have any recent x-rays of his foot [facility] Dr. Odessa FlemingKlein's office and these do not show any definite evidence of further osteomyelitis. Wound History Patient presents with 1 open wound that has been present for approximately 8weeks. Patient has been treating wound in the following manner: wet to dry. Laboratory tests have been performed in the last month. Patient reportedly has tested positive for an antibiotic resistant organism. Patient reportedly has tested Tiner, Kyland L. ([id number]) positive for osteomyelitis. Patient reportedly has not had testing performed to evaluate circulation in the legs. Patient experiences the following problems associated with their wounds: infection. Patient History Information obtained from Patient. Allergies Sulfa (Sulfonamide Antibiotics) (Severity: Severe, Reaction: Acute Kidney Failure) Family History No family history of Cancer, Diabetes, Heart Disease,  Hypertension, Kidney Disease, Lung Disease, Seizures, Stroke. Social History Never smoker, Marital Status - Single, Alcohol Use - Never, Drug Use - No History, Caffeine Use - Moderate. Medical History Eyes Denies history of Cataracts, Glaucoma, Optic Neuritis Ear/Nose/Mouth/Throat Denies history of Chronic sinus problems/congestion, Middle ear problems Hematologic/Lymphatic Patient has history of Anemia Denies history of Hemophilia, Human Immunodeficiency Virus, Lymphedema, Sickle Cell Disease Respiratory Patient has history of Sleep Apnea Denies history of Aspiration, Asthma, Chronic Obstructive Pulmonary Disease (COPD), Pneumothorax, Tuberculosis Cardiovascular Patient has history of Hypertension Denies history of Angina, Arrhythmia, Congestive Heart Failure, Coronary Artery Disease, Deep Vein Thrombosis, Hypotension, Myocardial Infarction, Peripheral Arterial Disease, Peripheral Venous Disease, Phlebitis, Vasculitis  Gastrointestinal Denies history of Cirrhosis , Colitis, Crohn s, Hepatitis A, Hepatitis B, Hepatitis C Endocrine Patient has history of Type II Diabetes Denies history of Type I Diabetes Genitourinary Denies history of End Stage Renal Disease Immunological Denies history of Lupus Erythematosus, Raynaud s, Scleroderma Integumentary (Skin) Denies history of History of Burn, History of pressure wounds Musculoskeletal Patient has history of Osteomyelitis - Third toe-bone removed Denies history of Gout, Rheumatoid Arthritis, Osteoarthritis Neurologic Patient has history of Neuropathy Steenson, Sebastiano L. ([id number]) Denies history of [id number], Quadriplegia, Paraplegia Oncologic Denies history of Received Chemotherapy, Received Radiation Psychiatric Denies history of Anorexia/bulimia Patient is treated with Insulin. Blood sugar is tested. Blood sugar results noted at the following times: Breakfast - 148. Hospitalization/Surgery History - [facility]  History Notes Constitutional Symptoms (General Health) Type II Diabetic, HTN, Anemia, Acute Kidney Failure (Bactrim) Genitourinary Acute Kidney Disease Review of Systems (ROS) Constitutional Symptoms (General Health) The patient has no complaints or symptoms. Eyes The patient has no complaints or symptoms. Ear/Nose/Mouth/Throat The patient has no complaints or symptoms. Hematologic/Lymphatic The patient has no complaints or symptoms. Respiratory The patient has no complaints or symptoms. Cardiovascular The patient has no complaints or symptoms. Gastrointestinal The patient has no complaints or symptoms. Immunological The patient has no complaints or symptoms. Integumentary (Skin) Complains or has symptoms of Wounds. Denies complaints or symptoms of Bleeding or bruising tendency, Breakdown, Swelling. Musculoskeletal The patient has no complaints or symptoms. Neurologic The patient has no complaints or symptoms. Oncologic The patient has no complaints or symptoms. Psychiatric The patient has no complaints or symptoms. Objective Soderman, Christino L. ([id number]) Constitutional Pulse regular. Respirations normal and unlabored. Afebrile. Vitals Time Taken: 10:30 AM, Height: 74 in, Source: Stated, Weight: 193.3 lbs, Source: Stated, BMI: 24.8, Temperature: 97.8 F, Pulse: 72 bpm, Respiratory Rate: 18 breaths/min, Blood Pressure: 148/80 mmHg. Eyes Nonicteric. Reactive to light. Ears, Nose, Mouth, and Throat Lips, teeth, and gums WNL.[name] Moist mucosa without lesions . Neck supple and nontender. No palpable supraclavicular or cervical adenopathy. Normal sized without goiter. Respiratory WNL. No retractions.. Cardiovascular Pedal Pulses WNL. No clubbing, cyanosis or edema. Gastrointestinal (GI) Abdomen without masses or tenderness.. No liver or spleen enlargement or tenderness.. Lymphatic No adneopathy. No adenopathy. No adenopathy. Musculoskeletal Adexa without tenderness or  enlargement.. Digits and nails w/o clubbing, cyanosis, infection, petechiae, ischemia, or inflammatory conditions.[name] Psychiatric Judgement and insight Intact.. No evidence of depression, anxiety, or agitation.. General Notes: the wound probes down to about 3-1/2 cm at about the 7:00 position depth is about 2.5 cm and at about 12:00. It undermines to put 2.5 cm. There was a serous sanguinous fluid pocket at the depths of it. There is no evidence of cellulitis either on the plantar dorsum of the foot. Integumentary (Hair, Skin) No suspicious lesions. No crepitus or fluctuance. No peri-wound warmth or erythema. No masses.. Wound #1 status is Open. Original cause of wound was Trauma. The wound is located on the Left,Plantar Metatarsal head fourth. The wound measures 0.6cm length x 2cm width x 2.4cm depth; 0.942cm^2 area and 2.262cm^3 volume. There is bone exposed. There is tunneling at 7:00 with a maximum distance of 3.5cm. There is additional tunneling and at 12:00 with a maximum distance of 2.5cm. There is a medium amount of serosanguineous drainage noted. The wound margin is flat and intact. There is small (1-33%) granulation within the wound bed. There is no necrotic tissue within the wound bed. The periwound skin Vanbeek, Tavious L. ([id number]) appearance exhibited: Moist. The periwound  skin appearance did not exhibit: Callus, Crepitus, Excoriation, Fluctuance, Friable, Induration, Localized Edema, Rash, Scarring, Dry/Scaly, Maceration, Atrophie Blanche, Cyanosis, Ecchymosis, Hemosiderin Staining, Mottled, Pallor, Rubor, Erythema. General Notes: Patient has no feeing in this area. 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 Alberteen Spindle. his most recent x-ray does not show any evidence of chronic osteomyelitis but he does have a draining sinus tract for over 3 months now. He has also received IV antibiotics and is now on oral antibiotics under the care of Dr. Sampson Goon the ID specialist. After reviewing his care, I have recommended he continue packing the wound with Anacept and packing strip. I would definitely recommend hyperbaric oxygen therapy to help him take care of both his osteomyelitis and the Wagner grade 3 wound on his left foot. we would follow the protocol for hyperbaric oxygen therapy for chronic osteomyelitis and give him anywhere between 40-60 settings 5 times a week if he is agreeable. I have discussed at length the risks benefits alternatives and all the positive benefits of hyperbaric oxygen therapy and the patient is agreeable to undergo this. I have spoken to his podiatrist Dr. Gus Height who has no objections if we start hyperbaric oxygen therapy and we will also communicate this to Dr. Sampson Goon his ID specialist. Plan Wound Cleansing: Wound #1 Left,Plantar Metatarsal head fourth: Clean wound with Normal Saline. Anesthetic: Wound #1 Left,Plantar Metatarsal head fourth: Topical Lidocaine 4% cream applied to wound bed prior to debridement Primary Wound Dressing: Wound #1 Left,Plantar Metatarsal head fourth: Iodoform packing Gauze - packed into wound 3cm at 7:00; 2.5 at 12:00 Secondary Dressing: Rapozo, Townes L. ([id number]) Gauze and Kerlix/Conform Dressing Change Frequency: Wound #1 Left,Plantar Metatarsal head fourth: Change dressing every day. Follow-up Appointments: Wound #1 Left,Plantar Metatarsal head fourth: Return Appointment in 1 week. Off-Loading: Wound #1 Left,Plantar Metatarsal head fourth: Other: - Engineer, civil (consulting) Orders / Instructions: Wound #1 Left,Plantar Metatarsal head fourth: Increase protein  intake. Hyperbaric Oxygen Therapy: Wound #1 Left,Plantar Metatarsal head fourth: Evaluate for HBO Therapy Indication: - Osteomyelitis Medications-please add to medication list.: Wound #1 Left,Plantar Metatarsal head fourth: P.O. Antibiotics - continue prescribed antibiotics Kindly see my assessment above. He will follow-up with me next week and if we have his insurance authorization, will start with hyperbaric oxygen therapy as soon as possible. He will continue to see both Dr. Sampson Goon and Dr. Alberteen Spindle as planned. Electronic Signature(s) Signed: 07/22/2015 4:32:19 PM By: Evlyn Kanner MD, FACS Previous Signature: 07/22/2015 12:04:27 PM Version By: Evlyn Kanner MD, FACS Entered By: Evlyn Kanner on 07/22/2015 16:32:19 Luther Bradley ([id number]) -------------------------------------------------------------------------------- ROS/PFSH Details Patient Name: [name] Date of Service: 07/22/2015 10:15 AM Medical Record Number: [id number] Patient Account Number: [id number] Date of Birth/Sex: Aug 02, 1971 (44 y.o. Male) Treating RN: Huel Coventry Primary Care Physician: Corky Downs Other Clinician: Referring Physician: Treating Physician/Extender: Rudene Re in Treatment: 0 Information Obtained From Patient Wound History Do you currently have one or more open woundso Yes How many open wounds do you currently haveo 1 Approximately how long have you had your woundso 8weeks How have you been treating your wound(s) until nowo wet to dry Has your wound(s) ever healed and then re-openedo No Have  you had any lab work done in the past montho Yes Have you tested positive for an antibiotic resistant organism (MRSA, VRE)o Yes Date: 06/28/2015 Have you tested positive for osteomyelitis (bone infection)o Yes Have you had any tests for circulation on your legso No Have you had other problems associated with your woundso Infection Integumentary (Skin) Complaints and  Symptoms: Positive for: Wounds Negative for: Bleeding or bruising tendency; Breakdown; Swelling Medical History: Negative for: History of Burn; History of pressure wounds Constitutional Symptoms (General Health) Complaints and Symptoms: No Complaints or Symptoms Medical History: Past Medical History Notes: Type II Diabetic, HTN, Anemia, Acute Kidney Failure (Bactrim) Eyes Complaints and Symptoms: No Complaints or Symptoms Medical History: Negative for: Cataracts; Glaucoma; Optic Neuritis JEHAD, BISONO ([id number]) Ear/Nose/Mouth/Throat Complaints and Symptoms: No Complaints or Symptoms Medical History: Negative for: Chronic sinus problems/congestion; Middle ear problems Hematologic/Lymphatic Complaints and Symptoms: No Complaints or Symptoms Medical History: Positive for: Anemia Negative for: Hemophilia; Human Immunodeficiency Virus; Lymphedema; Sickle Cell Disease Respiratory Complaints and Symptoms: No Complaints or Symptoms Medical History: Positive for: Sleep Apnea Negative for: Aspiration; Asthma; Chronic Obstructive Pulmonary Disease (COPD); Pneumothorax; Tuberculosis Cardiovascular Complaints and Symptoms: No Complaints or Symptoms Medical History: Positive for: Hypertension Negative for: Angina; Arrhythmia; Congestive Heart Failure; Coronary Artery Disease; Deep Vein Thrombosis; Hypotension; Myocardial Infarction; Peripheral Arterial Disease; Peripheral Venous Disease; Phlebitis; Vasculitis Gastrointestinal Complaints and Symptoms: No Complaints or Symptoms Medical History: Negative for: Cirrhosis ; Colitis; Crohnos; Hepatitis A; Hepatitis B; Hepatitis C Endocrine Medical History: Positive for: Type II Diabetes Negative for: Type I Diabetes Time with diabetes: 20 years Ting, Roey L. ([id number]) Treated with: Insulin Blood sugar tested every day: Yes Tested : every morning Blood sugar testing results: Breakfast: 148 Genitourinary Medical  History: Negative for: End Stage Renal Disease Past Medical History Notes: Acute Kidney Disease Immunological Complaints and Symptoms: No Complaints or Symptoms Medical History: Negative for: Lupus Erythematosus; Raynaudos; Scleroderma Musculoskeletal Complaints and Symptoms: No Complaints or Symptoms Medical History: Positive for: Osteomyelitis - Third toe-bone removed Negative for: Gout; Rheumatoid Arthritis; Osteoarthritis Neurologic Complaints and Symptoms: No Complaints or Symptoms Medical History: Positive for: Neuropathy Negative for: Dementia; Quadriplegia; Paraplegia Oncologic Complaints and Symptoms: No Complaints or Symptoms Medical History: Negative for: Received Chemotherapy; Received Radiation Psychiatric Complaints and Symptoms: No Complaints or Symptoms Medical History: AKEEL, REFFNER ([id number]) Negative for: Anorexia/bulimia Hospitalization / Surgery History Name of Hospital Purpose of Hospitalization/Surgery Date [facility] Family and Social History Cancer: No; Diabetes: No; Heart Disease: No; Hypertension: No; Kidney Disease: No; Lung Disease: No; Seizures: No; Stroke: No; Never smoker; Marital Status - Single; Alcohol Use: Never; Drug Use: No History; Caffeine Use: Moderate; Living Will: No; Medical Power of Attorney: Yes (Not Provided) Physician Affirmation I have reviewed and agree with the above information. Electronic Signature(s) Signed: 07/22/2015 4:31:29 PM By: Evlyn Kanner MD, FACS Signed: 07/22/2015 5:46:49 PM By: Elliot Gurney RN, BSN, Kim RN, BSN Previous Signature: 07/22/2015 11:04:55 AM Version By: Evlyn Kanner MD, FACS Entered By: Elliot Gurney RN, BSN, Kim on 07/22/2015 12:07:08 Penning, Brandy Hale ([id number]) -------------------------------------------------------------------------------- SuperBill Details Patient Name: [name] Date of Service: 07/22/2015 Medical Record Number: [id number] Patient Account Number: [id number] Date of  Birth/Sex: 10/21/1970 (44 y.o. 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

## 2015-07-23 NOTE — Progress Notes (Signed)
JUSTICE, MILLIRON ([id number]) Visit Report for 07/22/2015 Allergy List Details Patient Name: SHERYL, TOWELL. Date of Service: 07/22/2015 10:15 AM Medical Record Number: [id number] Patient Account Number: [id number] Date of Birth/Sex: 20-Jul-1971 (44 y.o. Male) Treating RN: Huel Coventry Primary Care Physician: Corky Downs Other Clinician: Referring Physician: Treating Physician/Extender: Rudene Re in Treatment: 0 Allergies Active Allergies Sulfa (Sulfonamide Antibiotics) Reaction: Acute Kidney Failure Severity: Severe Allergy Notes Electronic Signature(s) Signed: 07/22/2015 5:46:49 PM By: Elliot Gurney, RN, BSN, Kim RN, BSN Entered By: Elliot Gurney, RN, BSN, Kim on 07/22/2015 11:10:49 [place], Brandy Hale ([id number]) -------------------------------------------------------------------------------- Arrival Information Details Patient Name: [name] Date of Service: 07/22/2015 10:15 AM Medical Record Number: [id number] Patient Account Number: [id number] Date of Birth/Sex: Oct 14, 1970 (44 y.o. Male) Treating RN: Huel Coventry Primary Care Physician: Corky Downs Other Clinician: Referring Physician: Treating Physician/Extender: Rudene Re in Treatment: 0 Visit Information Patient Arrived: Ambulatory Arrival Time: 10:20 Accompanied By: self Transfer Assistance: None Patient Identification Verified: Yes Secondary Verification Process Yes Completed: Patient Has Alerts: Yes Patient Alerts: Patient on Blood Thinner Aspirin 325 Electronic Signature(s) Signed: 07/22/2015 5:46:49 PM By: Elliot Gurney, RN, BSN, Kim RN, BSN Entered By: Elliot Gurney, RN, BSN, Kim on 07/22/2015 10:56:51 [place], Brandy Hale ([id number]) -------------------------------------------------------------------------------- Clinic Level of Care Assessment Details Patient Name: Luther Bradley. Date of Service: 07/22/2015 10:15 AM Medical Record Number: [id number] Patient Account Number: [id number] Date of Birth/Sex:  Apr 06, 1971 (44 y.o. Male) Treating RN: Huel Coventry Primary Care Physician: Corky Downs Other Clinician: Referring Physician: Treating Physician/Extender: Rudene Re in Treatment: 0 Clinic Level of Care Assessment Items TOOL 2 Quantity Score []  - Use when only an EandM is performed on the INITIAL visit 0 ASSESSMENTS - Nursing Assessment / Reassessment X - General Physical Exam (combine w/ comprehensive assessment (listed just 1 20 below) when performed on new pt. evals) X - Comprehensive Assessment (HX, ROS, Risk Assessments, Wounds Hx, etc.) 1 25 ASSESSMENTS - Wound and Skin Assessment / Reassessment X - Simple Wound Assessment / Reassessment - one wound 1 5 []  - Complex Wound Assessment / Reassessment - multiple wounds 0 []  - Dermatologic / Skin Assessment (not related to wound area) 0 ASSESSMENTS - Ostomy and/or Continence Assessment and Care []  - Incontinence Assessment and Management 0 []  - Ostomy Care Assessment and Management (repouching, etc.) 0 PROCESS - Coordination of Care X - Simple Patient / Family Education for ongoing care 1 15 []  - Complex (extensive) Patient / Family Education for ongoing care 0 X - Staff obtains Chiropractor, Records, Test Results / Process Orders 1 10 []  - Staff telephones HHA, Nursing Homes / Clarify orders / etc 0 []  - Routine Transfer to another Facility (non-emergent condition) 0 []  - Routine Hospital Admission (non-emergent condition) 0 X - New Admissions / Manufacturing engineer / Ordering NPWT, Apligraf, etc. 1 15 []  - Emergency Hospital Admission (emergent condition) 0 X - Simple Discharge Coordination 1 10 [place], Friend L. ([id number]) []  - Complex (extensive) Discharge Coordination 0 PROCESS - Special Needs []  - Pediatric / Minor Patient Management 0 []  - Isolation Patient Management 0 []  - Hearing / Language / Visual special needs 0 []  - Assessment of Community assistance (transportation, D/C planning, etc.) 0 []  - Additional  assistance / Altered mentation 0 []  - Support Surface(s) Assessment (bed, cushion, seat, etc.) 0 INTERVENTIONS - Wound Cleansing / Measurement X - Wound Imaging (photographs - any number of wounds) 1 5 []  - Wound Tracing (instead of photographs) 0 X - Simple Wound Measurement - one  wound 1 5 []  - Complex Wound Measurement - multiple wounds 0 X - Simple Wound Cleansing - one wound 1 5 []  - Complex Wound Cleansing - multiple wounds 0 INTERVENTIONS - Wound Dressings []  - Small Wound Dressing one or multiple wounds 0 X - Medium Wound Dressing one or multiple wounds 1 15 []  - Large Wound Dressing one or multiple wounds 0 []  - Application of Medications - injection 0 INTERVENTIONS - Miscellaneous []  - External ear exam 0 []  - Specimen Collection (cultures, biopsies, blood, body fluids, etc.) 0 []  - Specimen(s) / Culture(s) sent or taken to Lab for analysis 0 []  - Patient Transfer (multiple staff / Nurse, adult / Similar devices) 0 []  - Simple Staple / Suture removal (25 or less) 0 []  - Complex Staple / Suture removal (26 or more) 0 Wendling, Eldon L. ([id number]) []  - Hypo / Hyperglycemic Management (close monitor of Blood Glucose) 0 X - Ankle / Brachial Index (ABI) - do not check if billed separately 1 15 Has the patient been seen at the hospital within the last three years: Yes Total Score: 145 Level Of Care: New/Established - Level 4 Electronic Signature(s) Signed: 07/22/2015 5:46:49 PM By: Elliot Gurney, RN, BSN, Kim RN, BSN Entered By: Elliot Gurney, RN, BSN, Kim on 07/22/2015 11:54:25 [place], Brandy Hale ([id number]) -------------------------------------------------------------------------------- Encounter Discharge Information Details Patient Name: [name] Date of Service: 07/22/2015 10:15 AM Medical Record Number: [id number] Patient Account Number: [id number] Date of Birth/Sex: 1970-12-26 (44 y.o. Male) Treating RN: Huel Coventry Primary Care Physician: Corky Downs Other Clinician: Referring  Physician: Treating Physician/Extender: Rudene Re in Treatment: 0 Encounter Discharge Information Items Discharge Pain Level: 0 Discharge Condition: Stable Ambulatory Status: Ambulatory Discharge Destination: Home Transportation: Private Auto self; case Accompanied By: manager Schedule Follow-up Appointment: Yes Medication Reconciliation completed and provided to Patient/Care Yes Ozzie Remmers: Provided on Clinical Summary of Care: 07/22/2015 Form Type Recipient Paper Patient [facility] Electronic Signature(s) Signed: 07/22/2015 5:46:49 PM By: Elliot Gurney RN, BSN, Kim RN, BSN Previous Signature: 07/22/2015 11:50:03 AM Version By: Gwenlyn Perking Entered By: Elliot Gurney RN, BSN, Kim on 07/22/2015 11:55:47 [place], Brandy Hale ([id number]) -------------------------------------------------------------------------------- Lower Extremity Assessment Details Patient Name: [name] Date of Service: 07/22/2015 10:15 AM Medical Record Number: [id number] Patient Account Number: [id number] Date of Birth/Sex: Apr 14, 1971 (44 y.o. Male) Treating RN: Huel Coventry Primary Care Physician: Corky Downs Other Clinician: Referring Physician: Treating Physician/Extender: Rudene Re in Treatment: 0 Vascular Assessment Pulses: Posterior Tibial Palpable: [Left:Yes] Doppler: [Left:Multiphasic] Dorsalis Pedis Palpable: [Left:Yes] Doppler: [Left:Multiphasic] Extremity colors, hair growth, and conditions: Extremity Color: [Left:Normal] Hair Growth on Extremity: [Left:Yes] Temperature of Extremity: [Left:Warm] Capillary Refill: [Left:< 3 seconds] Blood Pressure: Brachial: [Left:160] Dorsalis Pedis: 200 [Left:Dorsalis Pedis:] Ankle: Posterior Tibial: 200 [Left:Posterior Tibial: 1.25] Toe Nail Assessment Left: Right: Thick: Yes Discolored: Yes Deformed: No Improper Length and Hygiene: No Electronic Signature(s) Signed: 07/22/2015 5:46:49 PM By: Elliot Gurney, RN, BSN, Kim RN, BSN Entered By:  Elliot Gurney, RN, BSN, Kim on 07/22/2015 10:58:32 Rothery, Brandy Hale ([id number]) -------------------------------------------------------------------------------- Multi Wound Chart Details Patient Name: [name] Date of Service: 07/22/2015 10:15 AM Medical Record Number: [id number] Patient Account Number: [id number] Date of Birth/Sex: [birth year] (44 y.o. Male) Treating RN: Huel Coventry Primary Care Physician: Corky Downs Other Clinician: Referring Physician: Treating Physician/Extender: Rudene Re in Treatment: 0 Vital Signs Height(in): 74 Pulse(bpm): 72 Weight(lbs): 193.3 Blood Pressure 148/80 (mmHg): Body Mass Index(BMI): 25 Temperature(F): 97.8 Respiratory Rate 18 (breaths/min): Photos: [1:No Photos] [N/A:N/A] Wound Location: [1:Left Metatarsal head fourth - Plantar] [N/A:N/A] Wounding  Event: [1:Trauma] [N/A:N/A] Primary Etiology: [1:Diabetic Wound/Ulcer of the Lower Extremity] [N/A:N/A] Secondary Etiology: [1:Trauma, Other] [N/A:N/A] Comorbid History: [1:Anemia, Sleep Apnea, Hypertension, Type II Diabetes, Osteomyelitis, Neuropathy] [N/A:N/A] Date Acquired: [1:03/01/2015] [N/A:N/A] Weeks of Treatment: [1:0] [N/A:N/A] Wound Status: [1:Open] [N/A:N/A] Measurements L x W x D 0.6x2x2.4 [N/A:N/A] (cm) Area (cm) : [1:0.942] [N/A:N/A] Volume (cm) : [1:2.262] [N/A:N/A] % Reduction in Area: [1:0.00%] [N/A:N/A] % Reduction in Volume: 0.00% [N/A:N/A] Classification: [1:Grade 3] [N/A:N/A] Wagner Verification: [1:MRI] [N/A:N/A] Exudate Amount: [1:Medium] [N/A:N/A] Exudate Type: [1:Serosanguineous] [N/A:N/A] Exudate Color: [1:red, brown] [N/A:N/A] Wound Margin: [1:Flat and Intact] [N/A:N/A] Granulation Amount: [1:Small (1-33%)] [N/A:N/A] Necrotic Amount: [1:None Present (0%)] [N/A:N/A] Exposed Structures: [1:Bone: Yes Fascia: No] [N/A:N/A] Fat: No Tendon: No Muscle: No Joint: No Periwound Skin Texture: Edema: No N/A N/A Excoriation: No Induration: No Callus:  No Crepitus: No Fluctuance: No Friable: No Rash: No Scarring: No Periwound Skin Moist: Yes N/A N/A Moisture: Maceration: No Dry/Scaly: No Periwound Skin Color: Atrophie Blanche: No N/A N/A Cyanosis: No Ecchymosis: No Erythema: No Hemosiderin Staining: No Mottled: No Pallor: No Rubor: No Tenderness on No N/A N/A Palpation: Wound Preparation: Ulcer Cleansing: N/A N/A Rinsed/Irrigated with Saline Topical Anesthetic Applied: Other: lidocaine 4% Assessment Notes: Patient has no feeing in N/A N/A this area. Treatment Notes Electronic Signature(s) Signed: 07/22/2015 5:46:49 PM By: Elliot Gurney, RN, BSN, Kim RN, BSN Entered By: Elliot Gurney, RN, BSN, Kim on 07/22/2015 11:25:03 [place], Brandy Hale ([id number]) -------------------------------------------------------------------------------- Multi-Disciplinary Care Plan Details Patient Name: Luther Bradley. Date of Service: 07/22/2015 10:15 AM Medical Record Number: [id number] Patient Account Number: [id number] Date of Birth/Sex: 1970/12/28 (44 y.o. Male) Treating RN: Huel Coventry Primary Care Physician: Corky Downs Other Clinician: Referring Physician: Treating Physician/Extender: Rudene Re in Treatment: 0 Active Inactive Abuse / Safety / Falls / Self Care Management Nursing Diagnoses: Impaired physical mobility Potential for falls Goals: Patient will remain injury free Date Initiated: 07/22/2015 Goal Status: Active Interventions: Assess fall risk on admission and as needed Treatment Activities: Patient referred to home care : 07/22/2015 Notes: Nutrition Nursing Diagnoses: Imbalanced nutrition Goals: Patient/caregiver agrees to and verbalizes understanding of need to obtain nutritional consultation Date Initiated: 07/22/2015 Goal Status: Active Interventions: Assess patient nutrition upon admission and as needed per policy Notes: Orientation to the Wound Care Program Nursing Diagnoses: Knowledge deficit related  to the wound healing center program SIRE, POET ([id number]) Goals: Patient/caregiver will verbalize understanding of the Wound Healing Center Program Date Initiated: 07/22/2015 Goal Status: Active Interventions: Provide education on orientation to the wound center Notes: Soft Tissue Infection Nursing Diagnoses: Impaired tissue integrity Potential for infection: soft tissue Goals: Patient will remain free of wound infection Date Initiated: 07/22/2015 Goal Status: Active Signs and symptoms of infection will be recognized early to allow for prompt treatment Date Initiated: 07/22/2015 Goal Status: Active Interventions: Assess signs and symptoms of infection every visit Treatment Activities: Systemic antibiotics : 07/22/2015 Notes: Wound/Skin Impairment Nursing Diagnoses: Impaired tissue integrity Goals: Ulcer/skin breakdown will heal within 14 weeks Date Initiated: 07/22/2015 Goal Status: Active Interventions: Assess ulceration(s) every visit Notes: JACQUESE, HACKMAN ([id number]) Electronic Signature(s) Signed: 07/22/2015 5:46:49 PM By: Elliot Gurney, RN, BSN, Kim RN, BSN Entered By: Elliot Gurney, RN, BSN, Kim on 07/22/2015 11:24:40 [place]e, Brandy Hale ([id number]) -------------------------------------------------------------------------------- Pain Assessment Details Patient Name: [name] Date of Service: 07/22/2015 10:15 AM Medical Record Number: [id number] Patient Account Number: [id number] Date of Birth/Sex: 1971-05-25 (44 y.o. Male) Treating RN: Huel Coventry Primary Care Physician: Corky Downs Other Clinician: Referring Physician: Treating Physician/Extender: Rudene Re in  Treatment: 0 Active Problems Location of Pain Severity and Description of Pain Patient Has Paino No Site Locations Pain Management and Medication Current Pain Management: Electronic Signature(s) Signed: 07/22/2015 5:46:49 PM By: Elliot GurneyWoody, RN, BSN, Kim RN, BSN Entered By: Elliot GurneyWoody, RN, BSN,  Kim on 07/22/2015 10:57:07 Yamashiro, Brandy HaleWALTZIE L. ([id number]) -------------------------------------------------------------------------------- Patient/Caregiver Education Details Patient Name: Luther BradleyHICKS, Blu L. Date of Service: 07/22/2015 10:15 AM Medical Record Number: [id number] Patient Account Number: [id number] Date of Birth/Gender: 07/21/1971 [age] y.o. Male) Treating RN: Huel CoventryWoody, Kim Primary Care Physician: Corky DownsMASOUD, JAVED Other Clinician: Referring Physician: Treating Physician/Extender: Rudene ReBritto, Errol Weeks in Treatment: 0 Education Assessment Education Provided To: Patient Education Topics Provided Hyperbaric Oxygenation: Handouts: Hyperbaric Oxygen Methods: Printed Responses: State content correctly Wound/Skin Impairment: Handouts: Caring for Your Ulcer, Other: continue wound care as prescribed Methods: Demonstration, Explain/Verbal Responses: State content correctly Electronic Signature(s) Signed: 07/22/2015 5:46:49 PM By: Elliot GurneyWoody, RN, BSN, Kim RN, BSN Entered By: Elliot GurneyWoody, RN, BSN, Kim on 07/22/2015 11:56:18 [place], Brandy HaleWALTZIE L. ([id number]) -------------------------------------------------------------------------------- Wound Assessment Details Patient Name: Renette ButtersHICKS, Pruitt L. Date of Service: 07/22/2015 10:15 AM Medical Record Number: [id number] Patient Account Number: [id number] Date of Birth/Sex: 08/30/1971 [age] y.o. Male) Treating RN: Huel CoventryWoody, Kim Primary Care Physician: Corky DownsMASOUD, JAVED Other Clinician: Referring Physician: Treating Physician/Extender: Rudene ReBritto, Errol Weeks in Treatment: 0 Wound Status Wound Number: 1 Primary Diabetic Wound/Ulcer of the Lower Etiology: Extremity Wound Location: Left Metatarsal head fourth - Plantar Secondary Trauma, Other Etiology: Wounding Event: Trauma Wound Open Date Acquired: 03/01/2015 Status: Weeks Of Treatment: 0 Comorbid Anemia, Sleep Apnea, Hypertension, Clustered Wound: No History: Type II Diabetes,  Osteomyelitis, Neuropathy Photos Photo Uploaded By: Elliot GurneyWoody, RN, BSN, Kim on 07/22/2015 16:12:43 Wound Measurements Length: (cm) 0.6 Width: (cm) 2 Depth: (cm) 2.4 Area: (cm) 0.942 Volume: (cm) 2.262 % Reduction in Area: 0% % Reduction in Volume: 0% Tunneling: Yes Location 1 Position (o'clock): 7 Maximum Distance: (cm) 3.5 Location 2 Position (o'clock): 12 Maximum Distance: (cm) 2.5 Wound Description Classification: Grade 3 Wagner Verification: MRI Wound Margin: Flat and Intact Exudate Amount: Medium [place], Dodge L. ([id number]) Exudate Type: Serosanguineous Exudate Color: red, brown Wound Bed Granulation Amount: Small (1-33%) Exposed Structure Necrotic Amount: None Present (0%) Fascia Exposed: No Fat Layer Exposed: No Tendon Exposed: No Muscle Exposed: No Joint Exposed: No Bone Exposed: Yes Periwound Skin Texture Texture Color No Abnormalities Noted: No No Abnormalities Noted: No Callus: No Atrophie Blanche: No Crepitus: No Cyanosis: No Excoriation: No Ecchymosis: No Fluctuance: No Erythema: No Friable: No Hemosiderin Staining: No Induration: No Mottled: No Localized Edema: No Pallor: No Rash: No Rubor: No Scarring: No Moisture No Abnormalities Noted: No Dry / Scaly: No Maceration: No Moist: Yes Wound Preparation Ulcer Cleansing: Rinsed/Irrigated with Saline Topical Anesthetic Applied: Other: lidocaine 4%, Assessment Notes Patient has no feeing in this area. Treatment Notes Wound #1 (Left, Plantar Metatarsal head fourth) 1. Cleansed with: Clean wound with Normal Saline 2. Anesthetic Topical Lidocaine 4% cream to wound bed prior to debridement 4. Dressing Applied: Iodoform packing Gauze 5. Secondary Dressing Applied Gauze and Kerlix/Conform Obrien, Azul L. ([id number]) 6. Footwear/Offloading device applied Wedge shoe Notes front-offloader Electronic Signature(s) Signed: 07/22/2015 5:46:49 PM By: Elliot GurneyWoody, RN, BSN, Kim RN,  BSN Entered By: Elliot GurneyWoody, RN, BSN, Kim on 07/22/2015 12:06:25 Luther BradleyHICKS, Asbury L. ([id number]) -------------------------------------------------------------------------------- Vitals Details Patient Name: Renette ButtersHICKS, Kahli L. Date of Service: 07/22/2015 10:15 AM Medical Record Number: [id number] Patient Account Number: [id number] Date of Birth/Sex: 07/30/1971 [age] y.o. Male) Treating RN: Huel CoventryWoody, Kim Primary Care Physician: Corky DownsMASOUD, JAVED Other Clinician: Referring Physician: Treating Physician/Extender:  Britto, Errol Weeks in Treatment: 0 Vital Signs Time Taken: 10:30 Temperature (F): 97.8 Height (in): 74 Pulse (bpm): 72 Source: Stated Respiratory Rate (breaths/min): 18 Weight (lbs): 193.3 Blood Pressure (mmHg): 148/80 Source: Stated Reference Range: 80 - 120 mg / dl Body Mass Index (BMI): 24.8 Electronic Signature(s) Signed: 07/22/2015 5:46:49 PM By: Elliot Gurney, RN, BSN, Kim RN, BSN Entered By: Elliot Gurney, RN, BSN, Kim on 07/22/2015 10:57:56

## 2015-07-23 NOTE — Progress Notes (Signed)
Juan Roberson, Juan L. ([id number]) Visit Report for 07/22/2015 Abuse/Suicide Risk Screen Details Patient Name: Juan Roberson, Juan L. Date of Service: 07/22/2015 10:15 AM Medical Record Number: [id number] Patient Account Number: [id number] Date of Birth/Sex: 06/01/1971 [age] y.o. Male) Treating RN: Juan Roberson Primary Care Physician: Juan Roberson Other Clinician: Referring Physician: Treating Physician/Extender: Juan Roberson, Juan Roberson in Treatment: 0 Abuse/Suicide Risk Screen Items Answer ABUSE/SUICIDE RISK SCREEN: Has anyone close to you tried to hurt or harm you recentlyo No Do you feel uncomfortable with anyone in your familyo No Has anyone forced you do things that you didnot want to doo No Do you have any thoughts of harming yourselfo No Patient displays signs or symptoms of abuse and/or neglect. No Electronic Signature(s) Signed: 07/22/2015 5:46:49 PM By: Juan GurneyWoody, RN, BSN, Kim RN, BSN Entered By: Juan Roberson on 07/22/2015 11:03:59 Schlitt, Juan HaleWALTZIE L. ([id number]) -------------------------------------------------------------------------------- Activities of Daily Living Details Patient Name: [name], [name] Date of Service: 07/22/2015 10:15 AM Medical Record Number: [id number] Patient Account Number: [id number] Date of Birth/Sex: 11/05/1970 [age] y.o. Male) Treating RN: Juan Roberson Primary Care Physician: Juan Roberson Other Clinician: Referring Physician: Treating Physician/Extender: Juan Roberson, Juan Roberson in Treatment: 0 Activities of Daily Living Items Answer Activities of Daily Living (Please select one for each item) Drive Automobile Completely Able Take Medications Completely Able Use Telephone Completely Able Care for Appearance Completely Able Use Toilet Completely Able Bath / Shower Completely Able Dress Self Completely Able Feed Self Completely Able Walk Completely Able Get In / Out Bed Completely Able Housework Completely Able Prepare Meals Completely Able Handle Money  Completely Able Shop for Self Completely Able Electronic Signature(s) Signed: 07/22/2015 5:46:49 PM By: Juan GurneyWoody, RN, BSN, Kim RN, BSN Entered By: Juan Roberson on 07/22/2015 11:04:10 Kleinfelter, Juan HaleWALTZIE L. ([id number]) -------------------------------------------------------------------------------- Education Assessment Details Patient Name: Juan Roberson, Juan L. Date of Service: 07/22/2015 10:15 AM Medical Record Number: [id number] Patient Account Number: [id number] Date of Birth/Sex: 07/03/1971 [age] y.o. Male) Treating RN: Juan Roberson Primary Care Physician: Juan Roberson Other Clinician: Referring Physician: Treating Physician/Extender: Juan Roberson, Juan Roberson in Treatment: 0 Primary Learner Assessed: Patient Learning Preferences/Education Level/Primary Language Learning Preference: Explanation, Demonstration Highest Education Level: High School Preferred Language: English Cognitive Barrier Assessment/Beliefs Language Barrier: No Translator Needed: No Memory Deficit: No Emotional Barrier: No Cultural/Religious Beliefs Affecting Medical No Care: Physical Barrier Assessment Impaired Vision: No Impaired Hearing: No Decreased Hand dexterity: No Knowledge/Comprehension Assessment Knowledge Level: High Comprehension Level: High Ability to understand written High instructions: Ability to understand verbal High instructions: Motivation Assessment Anxiety Level: Calm Cooperation: Cooperative Education Importance: Acknowledges Need Interest in Health Problems: Asks Questions Perception: Coherent Willingness to Engage in Self- High Management Activities: Readiness to Engage in Self- High Management Activities: Electronic Signature(s) Juan Roberson, Juan L. ([id number]) Signed: 07/22/2015 5:46:49 PM By: Juan GurneyWoody, RN, BSN, Kim RN, BSN Entered By: Juan Roberson on 07/22/2015 11:04:34 [place]son, Juan HaleWALTZIE L.  ([id number]) -------------------------------------------------------------------------------- Fall Risk Assessment Details Patient Name: Juan Roberson, Juan L. Date of Service: 07/22/2015 10:15 AM Medical Record Number: [id number] Patient Account Number: [id number] Date of Birth/Sex: 10/05/1970 [age] y.o. Male) Treating RN: Juan Roberson Primary Care Physician: Juan Roberson Other Clinician: Referring Physician: Treating Physician/Extender: Juan Roberson, Juan Roberson in Treatment: 0 Fall Risk Assessment Items FALL RISK ASSESSMENT: History of falling - immediate or within 3 months 25 Yes Secondary diagnosis 0 No Ambulatory aid None/bed rest/wheelchair/nurse 0 Yes Crutches/cane/walker 0 No Furniture 0 No IV Access/Saline Lock 0 No Gait/Training Normal/bed rest/immobile 0 Yes Weak 0 No Impaired 0 No Mental Status Oriented  to own ability 0 Yes Electronic Signature(s) Signed: 07/22/2015 5:46:49 PM By: Juan Gurney, RN, BSN, Kim RN, BSN Entered By: Juan Gurney, RN, BSN, Roberson on 07/22/2015 11:04:41 Juan Roberson ([id number]) -------------------------------------------------------------------------------- Foot Assessment Details Patient Name: Juan Butters L. Date of Service: 07/22/2015 10:15 AM Medical Record Number: [id number] Patient Account Number: [id number] Date of Birth/Sex: 1971-05-30 (44 y.o. Male) Treating RN: Juan Coventry Primary Care Physician: Juan Downs Other Clinician: Referring Physician: Treating Physician/Extender: Juan Re in Treatment: 0 Foot Assessment Items Site Locations + = Sensation present, - = Sensation absent, C = Callus, U = Ulcer R = Redness, W = Warmth, M = Maceration, PU = Pre-ulcerative lesion F = Fissure, S = Swelling, D = Dryness Assessment Right: Left: Other Deformity: No No Prior Foot Ulcer: No No Prior Amputation: No No Charcot Joint: No No Ambulatory Status: Ambulatory Without Help Gait: Steady Electronic Signature(s) Signed: 07/22/2015 5:46:49 PM  By: Juan Gurney, RN, BSN, Kim RN, BSN Entered By: Juan Gurney, RN, BSN, Roberson on 07/22/2015 11:06:01 Muma, Juan Roberson ([id number]) -------------------------------------------------------------------------------- Nutrition Risk Assessment Details Patient Name: Juan Butters L. Date of Service: 07/22/2015 10:15 AM Medical Record Number: [id number] Patient Account Number: [id number] Date of Birth/Sex: November 27, 1970 (44 y.o. Male) Treating RN: Juan Coventry Primary Care Physician: Juan Downs Other Clinician: Referring Physician: Treating Physician/Extender: Juan Re in Treatment: 0 Height (in): 74 Weight (lbs): 193.3 Body Mass Index (BMI): 24.8 Nutrition Risk Assessment Items NUTRITION RISK SCREEN: I have an illness or condition that made me change the kind and/or 0 No amount of food I eat I eat fewer than two meals per day 0 No I eat few fruits and vegetables, or milk products 0 No I have three or more drinks of beer, liquor or wine almost every day 0 No I have tooth or mouth problems that make it hard for me to eat 0 No I don't always have enough money to buy the food I need 0 No I eat alone most of the time 0 No I take three or more different prescribed or over-the-counter drugs a 0 No day Without wanting to, I have lost or gained 10 pounds in the last six 2 Yes months I am not always physically able to shop, cook and/or feed myself 0 No Nutrition Protocols Good Risk Protocol Provide education on elevated blood sugars Moderate Risk Protocol 0 and impact on wound healing, as applicable Electronic Signature(s) Signed: 07/22/2015 5:46:49 PM By: Juan Gurney, RN, BSN, Kim RN, BSN Entered By: Juan Gurney, RN, BSN, Roberson on 07/22/2015 11:05:21

## 2015-07-29 ENCOUNTER — Encounter: Payer: BLUE CROSS/BLUE SHIELD | Attending: Surgery | Admitting: Surgery

## 2015-07-29 DIAGNOSIS — M86372 Chronic multifocal osteomyelitis, left ankle and foot: Secondary | ICD-10-CM | POA: Insufficient documentation

## 2015-07-29 DIAGNOSIS — E11621 Type 2 diabetes mellitus with foot ulcer: Secondary | ICD-10-CM | POA: Diagnosis not present

## 2015-07-29 DIAGNOSIS — L97524 Non-pressure chronic ulcer of other part of left foot with necrosis of bone: Secondary | ICD-10-CM | POA: Insufficient documentation

## 2015-07-30 NOTE — Progress Notes (Signed)
Juan Roberson, Juan L. ([id number]) Visit Report for 07/29/2015 Chief Complaint Document Details Patient Name: Juan Roberson, Juan L. Date of Service: 07/29/2015 1:45 PM Medical Record Number: [id number] Patient Account Number: [id number] Date of Birth/Sex: 07/23/1971 [age] y.o. Male) Treating RN: Juan Roberson Primary Care Physician: Juan Roberson Other Clinician: Referring Physician: Corky DownsMASOUD, Roberson Treating Physician/Extender: Juan Roberson Weeks in Treatment: 1 Information Obtained from: Patient Chief Complaint Patient presents to the wound care center for a consult due non healing wound the patient has had a nonhealing wound on the left plantar aspect of the foot since July 2016. Electronic Signature(s) Signed: 07/29/2015 2:42:03 PM By: Evlyn KannerBritto, Juan Bunyard MD, FACS Entered By: Evlyn KannerBritto, Reghan Thul on 07/29/2015 14:42:03 Schuermann, Brandy HaleWALTZIE L. ([id number]) -------------------------------------------------------------------------------- HPI Details Patient Name: Juan ButtersHICKS, Juan L. Date of Service: 07/29/2015 1:45 PM Medical Record Number: [id number] Patient Account Number: [id number] Date of Birth/Sex: 02/12/1971 14(44 y.o. Male) Treating RN: Juan Roberson Primary Care Physician: Juan Roberson Other Clinician: Referring Physician: Corky DownsMASOUD, Roberson Treating Physician/Extender: Juan ReBritto, Juan Roberson Weeks in Treatment: 1 History of Present Illness Location: open wound on the plantar aspect of his left foot Quality: Patient reports No Pain. Severity: Wound mildly severe with no infection and no exposed structure. 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 Modifying Factors: Other treatment(s) tried include: infectious disease and podiatry for seeing him on a regular basis. Associated Signs and Symptoms: Patient reports having  increase discharge. HPI Description: 44 year old gentleman who is nondiabetic had an injury to his left foot in June 2016 and ultimately had a IandD in early July for an abscess on the left foot. He was on oral antibiotics for a while and then had recurrent problems with on August 19 was taken up by Dr. Alberteen Roberson for an IandD of the abscess of the left forefoot with debridement of the infected bone of the proximal phalanx of the left third toe due to osteomyelitis. MRI done at that stage showed osteomyelitis of the proximal phalanx of the third toe with possible osteomyelitis involving the heads of the second third and fourth metatarsal with a phlegmon and abscess. Since then he was seen by the infectious disease Juan Roberson who put him on daptomycin IV for 4 weeks with a PICC line and oral ciprofloxacin. At the present time he is on clindamycin orally 300 mg 3 times a day and ciprofloxacin 300 mg twice daily. His wound care is done with and a separate on a daily basis. past medical history significant for diabetes mellitus, essential hypertension, chronic kidney disease, GERD, left leg DVT. He does have some recent x-rays of his foot done at Dr. Dory Larsenline's office and these do not show any definite evidence of further osteomyelitis. 07/29/2015 -- since I saw him last his wound care is been about the same and he has seen Juan Roberson who has kept him on clindamycin and ciprofloxacin and for another 21 days. He has also seen Dr. Alberteen Roberson who recommends he stay off work and his non weightbearing for a while. the patient is here with his caseworker from the [employer]. Electronic Signature(s) Signed: 07/29/2015 2:47:44 PM By: Evlyn KannerBritto, Darnella Zeiter MD, FACS Entered By: Evlyn KannerBritto, Jasiri Hanawalt on 07/29/2015 14:47:44 Vanwieren, Brandy HaleWALTZIE L. ([id number]) -------------------------------------------------------------------------------- Physical Exam Details Patient Name: Juan ButtersHICKS, Juan L. Date of Service: 07/29/2015 1:45  PM Medical Record Number:  [id number] Patient Account Number: [id number] Date of Birth/Sex: 1971-02-25 (44 y.o. Male) Treating RN: Juan Coventry Primary Care Physician: Juan Downs Other Clinician: Referring Physician: Corky Downs Treating Physician/Extender: Juan Re in Treatment: 1 Constitutional . Pulse regular. Respirations normal and unlabored. Afebrile. . Eyes Nonicteric. Reactive to light. Ears, Nose, Mouth, and Throat Lips, teeth, and gums WNL.[name] Moist mucosa without lesions . Neck supple and nontender. No palpable supraclavicular or cervical adenopathy. Normal sized without goiter. Respiratory WNL. No retractions.. Cardiovascular Pedal Pulses WNL. No clubbing, cyanosis or edema. Lymphatic No adneopathy. No adenopathy. No adenopathy. Musculoskeletal Adexa without tenderness or enlargement.. Digits and nails w/o clubbing, cyanosis, infection, petechiae, ischemia, or inflammatory conditions.. Integumentary (Hair, Skin) No suspicious lesions. No crepitus or fluctuance. No peri-wound warmth or erythema. No masses.[name] Psychiatric Judgement and insight Intact.. No evidence of depression, anxiety, or agitation.. Notes the depth and the undermining of the wound is about the same and there is no change in the dorsum of the foot. Electronic Signature(s) Signed: 07/29/2015 2:48:17 PM By: Evlyn Kanner MD, FACS Entered By: Evlyn Kanner on 07/29/2015 14:48:17 Juan Bradley ([id number]) -------------------------------------------------------------------------------- Physician Orders Details Patient Name: Juan Roberson L. Date of Service: 07/29/2015 1:45 PM Medical Record Number: [id number] Patient Account Number: [id number] Date of Birth/Sex: [birth year] (44 y.o. Male) Treating RN: Juan Coventry Primary Care Physician: Juan Downs Other Clinician: Referring Physician: Corky Downs Treating Physician/Extender: Juan Re in Treatment: 1 Verbal / Phone Orders:  Yes Clinician: Huel Coventry Read Back and Verified: Yes Diagnosis Coding Wound Cleansing Wound #1 Left,Plantar Metatarsal head fourth o Clean wound with Normal Saline. Anesthetic Wound #1 Left,Plantar Metatarsal head fourth o Topical Lidocaine 4% cream applied to wound bed prior to debridement Primary Wound Dressing Wound #1 Left,Plantar Metatarsal head fourth o Iodoform packing Gauze - packed into wound 2.8cm at 7:00; 2.5 at 12:00 Secondary Dressing o Gauze and Kerlix/Conform Dressing Change Frequency Wound #1 Left,Plantar Metatarsal head fourth o Change dressing every day. Follow-up Appointments Wound #1 Left,Plantar Metatarsal head fourth o Return Appointment in 1 week. Off-Loading Wound #1 Left,Plantar Metatarsal head fourth o Other: - Front off-loader Additional Orders / Instructions Wound #1 Left,Plantar Metatarsal head fourth o Increase protein intake. Hyperbaric Oxygen Therapy Wound #1 Left,Plantar Metatarsal head fourth o Evaluate for HBO Therapy o Indication: - Osteomyelitis Roberson, Juan L. ([id number]) Medications-please add to medication list. Wound #1 Left,Plantar Metatarsal head fourth o P.O. Antibiotics - continue prescribed antibiotics (Cipro and Clindamycin) Electronic Signature(s) Signed: 07/29/2015 3:59:19 PM By: Evlyn Kanner MD, FACS Signed: 07/29/2015 6:06:11 PM By: Elliot Gurney, RN, BSN, Kim RN, BSN Entered By: Elliot Gurney, RN, BSN, Roberson on 07/29/2015 14:07:02 Reller, Brandy Hale ([id number]) -------------------------------------------------------------------------------- Problem List Details Patient Name: Juan Roberson L. Date of Service: 07/29/2015 1:45 PM Medical Record Number: [id number] Patient Account Number: [id number] Date of Birth/Sex: 11-Jan-1971 (44 y.o. 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 Date of Service: 07/29/2015 1:45 PM Medical Record Number: [id number] Patient  Account Number: [id number] Date of Birth/Sex: [birth year] (44 y.o. Male) Treating RN: Juan Coventry Primary Care Physician: Juan Downs Other Clinician: Referring Physician: Corky Downs Treating Physician/Extender: Juan Re in Treatment: 1 Subjective Chief Complaint Information obtained from Patient Patient presents to the wound care center for a consult due non healing wound the patient has had a nonhealing wound on the left plantar aspect of the foot since July 2016. History of Present Illness (HPI) The following HPI elements were documented for the patient's wound: Location: open wound on the plantar aspect of his left foot Quality: Patient reports No Pain. Severity: Wound mildly severe with no infection and no exposed structure. 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 Modifying Factors: Other treatment(s) tried include: infectious disease and podiatry for seeing him on a regular basis. Associated Signs and Symptoms: Patient reports  having increase discharge. 44 year old gentleman who is nondiabetic had an injury to his left foot in June 2016 and ultimately had a IandD in early July for an abscess on the left foot. He was on oral antibiotics for a while and then had recurrent problems with on August 19 was taken up by Dr. Alberteen Spindle for an IandD of the abscess of the left forefoot with debridement of the infected bone of the proximal phalanx of the left third toe due to osteomyelitis. MRI done at that stage showed osteomyelitis of the proximal phalanx of the third toe with possible osteomyelitis involving the heads of the second third and fourth metatarsal with a phlegmon and abscess. Since then he was seen by the infectious disease Dr. Sampson Goon who put him on daptomycin IV for 4 weeks with a PICC line and oral ciprofloxacin. At the present time he is on clindamycin orally 300 mg 3 times a day and ciprofloxacin 300 mg twice daily. His wound care is done with and a separate on a daily basis. past medical history significant for diabetes mellitus, essential hypertension, chronic kidney disease, GERD, left leg DVT. He does have some recent x-rays of his foot done at Dr. Dory Larsen office and these do not show any definite evidence of further osteomyelitis. 07/29/2015 -- since I saw him last his wound care is been about the same and he has seen Dr. Sampson Goon who has kept him on clindamycin and ciprofloxacin and for another 21 days. He has also seen Dr. Alberteen Spindle who recommends he stay off work and his non weightbearing for a while. the patient is here with his caseworker from the [employer]. Roberson, Juan L. ([id number]) Objective Constitutional Pulse regular. Respirations normal and unlabored. Afebrile. Vitals Time Taken: 2:51 PM, Height: 74 in, Weight: 193.3 lbs, BMI: 24.8, Temperature: 98.8 F, Pulse: 115 bpm, Respiratory Rate: 20 breaths/min, Blood Pressure: 216/107 mmHg. Eyes Nonicteric. Reactive to light. Ears, Nose,  Mouth, and Throat Lips, teeth, and gums WNL.[name] Moist mucosa without lesions . Neck supple and nontender. No palpable supraclavicular or cervical adenopathy. Normal sized without goiter. Respiratory WNL. No retractions.. Cardiovascular Pedal Pulses WNL. No clubbing, cyanosis or edema. Lymphatic No adneopathy. No adenopathy. No adenopathy. Musculoskeletal Adexa without tenderness or enlargement.. Digits and nails w/o clubbing, cyanosis, infection, petechiae, ischemia, or inflammatory conditions.[name] Psychiatric Judgement and insight Intact.. No evidence of depression, anxiety, or agitation.. General Notes: the depth and the undermining of  the wound is about the same and there is no change in the dorsum of the foot. Integumentary (Hair, Skin) No suspicious lesions. No crepitus or fluctuance. No peri-wound warmth or erythema. No masses.. Wound #1 status is Open. Original cause of wound was Trauma. The wound is located on the Left,Plantar Metatarsal head fourth. The wound measures 0.5cm length x 1.5cm width x 2.3cm depth; 0.589cm^2 area and 1.355cm^3 volume. There is bone exposed. There is undermining starting at 7:00 and ending at 9:00 Roberson, Juan L. ([id number]) with a maximum distance of 2.8cm. There is a medium amount of serosanguineous drainage noted. The wound margin is flat and intact. There is small (1-33%) granulation within the wound bed. There is no necrotic tissue within the wound bed. The periwound skin appearance exhibited: Moist. The periwound skin appearance did not exhibit: Callus, Crepitus, Excoriation, Fluctuance, Friable, Induration, Localized Edema, Rash, Scarring, Dry/Scaly, Maceration, Atrophie Blanche, Cyanosis, Ecchymosis, Hemosiderin Staining, Mottled, Pallor, Rubor, Erythema. 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 Alberteen Spindle. His most recent x-ray does not show any evidence of chronic osteomyelitis but he does have a draining sinus tract for over 3 months now. He has also received IV antibiotics and is now on oral antibiotics under the care of Dr. Sampson Goon the ID specialist. After reviewing his care, I have recommended he continue packing the wound with Anacept and packing strip. I would definitely recommend hyperbaric oxygen therapy to help him take care of both his osteomyelitis and the Wagner grade 3 wound on his left foot. We would follow the protocol for hyperbaric oxygen therapy for chronic osteomyelitis and give him anywhere between 40-60 sittings, 5 times a week, if he is agreeable. I have discussed at length the risks benefits alternatives and all the possible benefits of hyperbaric oxygen therapy and the patient is agreeable to undergo this. Plan Wound Cleansing: Wound #1 Left,Plantar Metatarsal head fourth: Clean wound with Normal Saline. Anesthetic: Wound #1 Left,Plantar Metatarsal head fourth: Topical Lidocaine 4% cream applied to wound bed prior to debridement Primary Wound Dressing: Wound #1 Left,Plantar Metatarsal head fourth: Iodoform packing Gauze - packed into wound 2.8cm at 7:00; 2.5 at 12:00 Secondary Dressing: Gauze and Kerlix/Conform Roberson, Juan L. ([id number]) Dressing Change Frequency: Wound #1 Left,Plantar Metatarsal head fourth: Change dressing every day. Follow-up Appointments: Wound #1 Left,Plantar Metatarsal head fourth: Return Appointment in 1 week. Off-Loading: Wound #1 Left,Plantar Metatarsal head fourth: Other: - Engineer, civil (consulting) Orders / Instructions: Wound #1 Left,Plantar Metatarsal head fourth: Increase protein intake. Hyperbaric Oxygen  Therapy: Wound #1 Left,Plantar Metatarsal head fourth: Evaluate for HBO Therapy Indication: - Osteomyelitis Medications-please add to medication list.: Wound #1 Left,Plantar Metatarsal head fourth: P.O. Antibiotics - continue prescribed antibiotics (Cipro and Clindamycin) See details of my assessment above. Electronic Signature(s) Signed: 07/29/2015 2:51:03 PM By: Evlyn Kanner MD, FACS Entered By: Evlyn Kanner on 07/29/2015 14:51:03 Klier, Brandy Hale ([id number]) -------------------------------------------------------------------------------- SuperBill Details Patient Name: Juan Roberson L. Date of Service: 07/29/2015 Medical Record Number: [id number] Patient Account Number: [id number] Date of Birth/Sex: 26-Nov-1970 (44 y.o. 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

## 2015-07-30 NOTE — Progress Notes (Addendum)
Juan, Roberson ([id number]) Visit Report for 07/29/2015 Arrival Information Details Patient Name: Juan Roberson, Juan Roberson. Date of Service: 07/29/2015 1:45 PM Medical Record Number: [id number] Patient Account Number: [id number] Date of Birth/Sex: 05/08/1971 (44 y.o. Male) Treating RN: Huel Coventry Primary Care Physician: Corky Downs Other Clinician: Referring Physician: Corky Downs Treating Physician/Extender: Rudene Re in Treatment: 1 Visit Information History Since Last Visit Added or deleted any medications: No Patient Arrived: Ambulatory Any new allergies or adverse reactions: No Arrival Time: 13:45 Had a fall or experienced change in No Accompanied By: self activities of daily living that may affect Transfer Assistance: None risk of falls: Patient Identification Verified: Yes Signs or symptoms of abuse/neglect since last No Secondary Verification Process Yes visito Completed: Hospitalized since last visit: No Patient Has Alerts: Yes Has Dressing in Place as Prescribed: Yes Patient Alerts: Patient on Blood Pain Present Now: No Thinner Aspirin 325 Electronic Signature(s) Signed: 07/29/2015 6:06:11 PM By: Elliot Gurney, RN, BSN, Kim RN, BSN Entered By: Elliot Gurney, RN, BSN, Kim on 07/29/2015 13:47:05 Weintraub, Brandy Hale ([id number]) -------------------------------------------------------------------------------- Clinic Level of Care Assessment Details Patient Name: Juan Butters L. Date of Service: 07/29/2015 1:45 PM Medical Record Number: [id number] Patient Account Number: [id number] Date of Birth/Sex: 12-23-1970 (44 y.o. Male) Treating RN: Huel Coventry Primary Care Physician: Corky Downs Other Clinician: Referring Physician: Corky Downs Treating Physician/Extender: Rudene Re in Treatment: 1 Clinic Level of Care Assessment Items TOOL 4 Quantity Score  - Use when only an EandM is performed on FOLLOW-UP visit 0 ASSESSMENTS - Nursing Assessment / Reassessment   - Reassessment of Co-morbidities (includes updates in patient status) 0 X - Reassessment of Adherence to Treatment Plan 1 5 ASSESSMENTS - Wound and Skin Assessment / Reassessment X - Simple Wound Assessment / Reassessment - one wound 1 5  - Complex Wound Assessment / Reassessment - multiple wounds 0  - Dermatologic / Skin Assessment (not related to wound area) 0 ASSESSMENTS - Focused Assessment  - Circumferential Edema Measurements - multi extremities 0  - Nutritional Assessment / Counseling / Intervention 0  - Lower Extremity Assessment (monofilament, tuning fork, pulses) 0  - Peripheral Arterial Disease Assessment (using hand held doppler) 0 ASSESSMENTS - Ostomy and/or Continence Assessment and Care  - Incontinence Assessment and Management 0  - Ostomy Care Assessment and Management (repouching, etc.) 0 PROCESS - Coordination of Care X - Simple Patient / Family Education for ongoing care 1 15  - Complex (extensive) Patient / Family Education for ongoing care 0 X - Staff obtains Chiropractor, Records, Test Results / Process Orders 1 10  - Staff telephones HHA, Nursing Homes / Clarify orders / etc 0  - Routine Transfer to another Facility (non-emergent condition) 0 Glasser, Candace L. ([id number])  - Routine Hospital Admission (non-emergent condition) 0  - New Admissions / Manufacturing engineer / Ordering NPWT, Apligraf, etc. 0  - Emergency Hospital Admission (emergent condition) 0 X - Simple Discharge Coordination 1 10  - Complex (extensive) Discharge Coordination 0 PROCESS - Special Needs  - Pediatric / Minor Patient Management 0  - Isolation Patient Management 0  - Hearing / Language / Visual special needs 0  - Assessment of Community assistance (transportation, D/C planning, etc.) 0  - Additional assistance / Altered mentation 0  - Support Surface(s) Assessment (bed, cushion, seat, etc.) 0 INTERVENTIONS - Wound Cleansing / Measurement X -  Simple Wound Cleansing - one wound 1 5  - Complex Wound Cleansing - multiple wounds 0 X - Wound Imaging (photographs -  any number of wounds) 1 5 []  - Wound Tracing (instead of photographs) 0 X - Simple Wound Measurement - one wound 1 5 []  - Complex Wound Measurement - multiple wounds 0 INTERVENTIONS - Wound Dressings X - Small Wound Dressing one or multiple wounds 1 10 []  - Medium Wound Dressing one or multiple wounds 0 []  - Large Wound Dressing one or multiple wounds 0 []  - Application of Medications - topical 0 []  - Application of Medications - injection 0 INTERVENTIONS - Miscellaneous []  - External ear exam 0 Gill, Wille L. ([id number]) []  - Specimen Collection (cultures, biopsies, blood, body fluids, etc.) 0 []  - Specimen(s) / Culture(s) sent or taken to Lab for analysis 0 []  - Patient Transfer (multiple staff / Michiel Sites Lift / Similar devices) 0 []  - Simple Staple / Suture removal (25 or less) 0 []  - Complex Staple / Suture removal (26 or more) 0 []  - Hypo / Hyperglycemic Management (close monitor of Blood Glucose) 0 []  - Ankle / Brachial Index (ABI) - do not check if billed separately 0 X - Vital Signs 1 5 Has the patient been seen at the hospital within the last three years: Yes Total Score: 75 Level Of Care: New/Established - Level 2 Electronic Signature(s) Signed: 07/29/2015 6:06:11 PM By: Elliot Gurney, RN, BSN, Kim RN, BSN Entered By: Elliot Gurney, RN, BSN, Kim on 07/29/2015 14:09:26 Ungaro, Brandy Hale ([id number]) -------------------------------------------------------------------------------- Encounter Discharge Information Details Patient Name: Juan Butters L. Date of Service: 07/29/2015 1:45 PM Medical Record Number: [id number] Patient Account Number: [id number] Date of Birth/Sex: [birth year] (44 y.o. Male) Treating RN: Huel Coventry Primary Care Physician: Corky Downs Other Clinician: Referring Physician: Corky Downs Treating Physician/Extender: Rudene Re in  Treatment: 1 Encounter Discharge Information Items Discharge Pain Level: 3 Discharge Condition: Stable Ambulatory Status: Ambulatory Discharge Destination: Home Transportation: Private Auto Accompanied By: case worker Schedule Follow-up Appointment: Yes Medication Reconciliation completed and provided to Patient/Care Yes Ismerai Bin: Provided on Clinical Summary of Care: 07/29/2015 Form Type Recipient Paper Patient [facility] Electronic Signature(s) Signed: 07/29/2015 2:23:46 PM By: Gwenlyn Perking Entered By: Gwenlyn Perking on 07/29/2015 14:23:45 Vialpando, Brandy Hale ([id number]) -------------------------------------------------------------------------------- Lower Extremity Assessment Details Patient Name: Juan Butters L. Date of Service: 07/29/2015 1:45 PM Medical Record Number: [id number] Patient Account Number: [id number] Date of Birth/Sex: Oct 31, 1970 (44 y.o. Male) Treating RN: Huel Coventry Primary Care Physician: Corky Downs Other Clinician: Referring Physician: Corky Downs Treating Physician/Extender: Rudene Re in Treatment: 1 Vascular Assessment Pulses: Posterior Tibial Dorsalis Pedis Palpable: [Left:Yes] Extremity colors, hair growth, and conditions: Extremity Color: [Left:Normal] Hair Growth on Extremity: [Left:Yes] Temperature of Extremity: [Left:Warm] Capillary Refill: [Left:< 3 seconds] Toe Nail Assessment Left: Right: Thick: Yes Discolored: Yes Deformed: No Improper Length and Hygiene: No Electronic Signature(s) Signed: 07/29/2015 6:06:11 PM By: Elliot Gurney, RN, BSN, Kim RN, BSN Entered By: Elliot Gurney, RN, BSN, Kim on 07/29/2015 13:54:21 Fredlund, Brandy Hale ([id number]) -------------------------------------------------------------------------------- Multi Wound Chart Details Patient Name: Juan Butters L. Date of Service: 07/29/2015 1:45 PM Medical Record Number: [id number] Patient Account Number: [id number] Date of Birth/Sex: 11-Aug-1971 (44 y.o. Male) Treating RN:  Huel Coventry Primary Care Physician: Corky Downs Other Clinician: Referring Physician: Corky Downs Treating Physician/Extender: Rudene Re in Treatment: 1 Vital Signs Height(in): 74 Pulse(bpm): 115 Weight(lbs): 193.3 Blood Pressure 216/107 (mmHg): Body Mass Index(BMI): 25 Temperature(F): 98.8 Respiratory Rate 20 (breaths/min): Photos: [1:No Photos] [N/A:N/A] Wound Location: [1:Left Metatarsal head fourth - Plantar] [N/A:N/A] Wounding Event: [1:Trauma] [N/A:N/A] Primary Etiology: [1:Diabetic Wound/Ulcer of the Lower Extremity] [N/A:N/A] Secondary Etiology: [1:Trauma, Other] [N/A:N/A]  Comorbid History: [1:Anemia, Sleep Apnea, Hypertension, Type II Diabetes, Osteomyelitis, Neuropathy] [N/A:N/A] Date Acquired: [1:03/01/2015] [N/A:N/A] Weeks of Treatment: [1:1] [N/A:N/A] Wound Status: [1:Open] [N/A:N/A] Measurements L x W x D 0.5x1.5x2.3 [N/A:N/A] (cm) Area (cm) : [1:0.589] [N/A:N/A] Volume (cm) : [1:1.355] [N/A:N/A] % Reduction in Area: [1:37.50%] [N/A:N/A] % Reduction in Volume: 40.10% [N/A:N/A] Classification: [1:Grade 3] [N/A:N/A] Wagner Verification: [1:MRI] [N/A:N/A] Exudate Amount: [1:Medium] [N/A:N/A] Exudate Type: [1:Serosanguineous] [N/A:N/A] Exudate Color: [1:red, brown] [N/A:N/A] Wound Margin: [1:Flat and Intact] [N/A:N/A] Granulation Amount: [1:Small (1-33%)] [N/A:N/A] Necrotic Amount: [1:None Present (0%)] [N/A:N/A] Exposed Structures: [1:Bone: Yes Fascia: No] [N/A:N/A] Fat: No Tendon: No Muscle: No Joint: No Periwound Skin Texture: Edema: No N/A N/A Excoriation: No Induration: No Callus: No Crepitus: No Fluctuance: No Friable: No Rash: No Scarring: No Periwound Skin Moist: Yes N/A N/A Moisture: Maceration: No Dry/Scaly: No Periwound Skin Color: Atrophie Blanche: No N/A N/A Cyanosis: No Ecchymosis: No Erythema: No Hemosiderin Staining: No Mottled: No Pallor: No Rubor: No Tenderness on No N/A N/A Palpation: Wound  Preparation: Ulcer Cleansing: N/A N/A Rinsed/Irrigated with Saline Topical Anesthetic Applied: Other: lidocaine 4% Treatment Notes Electronic Signature(s) Signed: 07/29/2015 6:06:11 PM By: Elliot Gurney, RN, BSN, Kim RN, BSN Entered By: Elliot Gurney, RN, BSN, Kim on 07/29/2015 13:56:28 Ueda, Brandy Hale ([id number]) -------------------------------------------------------------------------------- Multi-Disciplinary Care Plan Details Patient Name: Luther Bradley. Date of Service: 07/29/2015 1:45 PM Medical Record Number: [id number] Patient Account Number: [id number] Date of Birth/Sex: [birth year] (44 y.o. Male) Treating RN: Huel Coventry Primary Care Physician: Corky Downs Other Clinician: Referring Physician: Corky Downs Treating Physician/Extender: Rudene Re in Treatment: 1 Active Inactive Electronic Signature(s) Signed: 08/30/2015 5:17:25 PM By: Elliot Gurney RN, BSN, Kim RN, BSN Previous Signature: 07/29/2015 6:06:11 PM Version By: Elliot Gurney, RN, BSN, Kim RN, BSN Entered By: Elliot Gurney, RN, BSN, Kim on 08/12/2015 08:05:30 Depriest, Brandy Hale ([id number]) -------------------------------------------------------------------------------- Pain Assessment Details Patient Name: Juan Butters L. Date of Service: 07/29/2015 1:45 PM Medical Record Number: [id number] Patient Account Number: [id number] Date of Birth/Sex: 1971-04-16 (44 y.o. Male) Treating RN: Huel Coventry Primary Care Physician: Corky Downs Other Clinician: Referring Physician: Corky Downs Treating Physician/Extender: Rudene Re in Treatment: 1 Active Problems Location of Pain Severity and Description of Pain Patient Has Paino Yes Site Locations Pain Location: Pain in Ulcers Duration of the Pain. Constant / Intermittento Constant Rate the pain. Current Pain Level: 6 Worst Pain Level: 8 Least Pain Level: 3 Character of Pain Describe the Pain: Heavy, Sharp, Tender, Throbbing Pain Management and Medication Current Pain  Management: Medication: No Cold Application: No Rest: No Massage: No Activity: No T.E.N.S.: No Heat Application: No Leg drop or elevation: No Is the Current Pain Management Inadequate Adequate: Electronic Signature(s) Signed: 07/29/2015 6:06:11 PM By: Elliot Gurney, RN, BSN, Kim RN, BSN Entered By: Elliot Gurney, RN, BSN, Kim on 07/29/2015 13:47:32 Dobransky, Brandy Hale ([id number]) -------------------------------------------------------------------------------- Patient/Caregiver Education Details Patient Name: Luther Bradley. Date of Service: 07/29/2015 1:45 PM Medical Record Number: [id number] Patient Account Number: [id number] Date of Birth/Gender: 1971/04/30 (44 y.o. Male) Treating RN: Huel Coventry Primary Care Physician: Corky Downs Other Clinician: Referring Physician: Corky Downs Treating Physician/Extender: Rudene Re in Treatment: 1 Education Assessment Education Provided To: Patient Education Topics Provided Wound/Skin Impairment: Handouts: Caring for Your Ulcer, Other: continue wound care as prescribed Methods: Demonstration, Explain/Verbal Responses: State content correctly Electronic Signature(s) Signed: 07/29/2015 6:06:11 PM By: Elliot Gurney, RN, BSN, Kim RN, BSN Entered By: Elliot Gurney, RN, BSN, Kim on 07/29/2015 14:13:56 Pizzini, Brandy Hale ([id number]) -------------------------------------------------------------------------------- Wound Assessment Details Patient Name: Juan Butters L. Date of Service:  07/29/2015 1:45 PM Medical Record Number: [id number] Patient Account Number: [id number] Date of Birth/Sex: 07/07/1971 [age] y.o. Male) Treating RN: Huel CoventryWoody, Kim Primary Care Physician: Corky DownsMASOUD, JAVED Other Clinician: Referring Physician: Corky DownsMASOUD, JAVED Treating Physician/Extender: Rudene ReBritto, Errol Weeks in Treatment: 1 Wound Status Wound Number: 1 Primary Diabetic Wound/Ulcer of the Lower Etiology: Extremity Wound Location: Left Metatarsal head fourth - Plantar Secondary Trauma,  Other Etiology: Wounding Event: Trauma Wound Open Date Acquired: 03/01/2015 Status: Weeks Of Treatment: 1 Comorbid Anemia, Sleep Apnea, Hypertension, Clustered Wound: No History: Type II Diabetes, Osteomyelitis, Neuropathy Wound Measurements Length: (cm) 0.5 Width: (cm) 1.5 Depth: (cm) 2.3 Area: (cm) 0.589 Volume: (cm) 1.355 % Reduction in Area: 37.5% % Reduction in Volume: 40.1% Undermining: Yes Starting Position (o'clock): 7 Ending Position (o'clock): 9 Maximum Distance: (cm) 2.8 Wound Description Classification: Grade 3 Wagner Verification: MRI Wound Margin: Flat and Intact Exudate Amount: Medium Exudate Type: Serosanguineous Exudate Color: red, brown Wound Bed Granulation Amount: Small (1-33%) Exposed Structure Necrotic Amount: None Present (0%) Fascia Exposed: No Fat Layer Exposed: No Tendon Exposed: No Muscle Exposed: No Joint Exposed: No Bone Exposed: Yes Periwound Skin Texture Texture Color Bert, Dajaun L. ([id number]) No Abnormalities Noted: No No Abnormalities Noted: No Callus: No Atrophie Blanche: No Crepitus: No Cyanosis: No Excoriation: No Ecchymosis: No Fluctuance: No Erythema: No Friable: No Hemosiderin Staining: No Induration: No Mottled: No Localized Edema: No Pallor: No Rash: No Rubor: No Scarring: No Moisture No Abnormalities Noted: No Dry / Scaly: No Maceration: No Moist: Yes Wound Preparation Ulcer Cleansing: Rinsed/Irrigated with Saline Topical Anesthetic Applied: Other: lidocaine 4%, Electronic Signature(s) Signed: 07/29/2015 6:06:11 PM By: Elliot GurneyWoody, RN, BSN, Kim RN, BSN Entered By: Elliot GurneyWoody, RN, BSN, Kim on 07/29/2015 14:05:39 Mazzarella, Brandy HaleWALTZIE L. ([id number]) -------------------------------------------------------------------------------- Vitals Details Patient Name: Juan ButtersHICKS, Moody L. Date of Service: 07/29/2015 1:45 PM Medical Record Number: [id number] Patient Account Number: [id number] Date of Birth/Sex: 11/01/1970 31(44  y.o. Male) Treating RN: Huel CoventryWoody, Kim Primary Care Physician: Corky DownsMASOUD, JAVED Other Clinician: Referring Physician: Corky DownsMASOUD, JAVED Treating Physician/Extender: Rudene ReBritto, Errol Weeks in Treatment: 1 Vital Signs Time Taken: 14:51 Temperature (F): 98.8 Height (in): 74 Pulse (bpm): 115 Weight (lbs): 193.3 Respiratory Rate (breaths/min): 20 Body Mass Index (BMI): 24.8 Blood Pressure (mmHg): 216/107 Reference Range: 80 - 120 mg / dl Electronic Signature(s) Signed: 07/29/2015 6:06:11 PM By: Elliot GurneyWoody, RN, BSN, Kim RN, BSN Entered By: Elliot GurneyWoody, RN, BSN, Kim on 07/29/2015 13:55:41

## 2015-08-05 ENCOUNTER — Ambulatory Visit: Payer: Self-pay | Admitting: Surgery

## 2015-10-14 ENCOUNTER — Other Ambulatory Visit: Payer: Self-pay | Admitting: Gastroenterology

## 2015-11-01 ENCOUNTER — Encounter: Payer: Self-pay | Admitting: Emergency Medicine

## 2015-11-01 ENCOUNTER — Inpatient Hospital Stay
Admission: EM | Admit: 2015-11-01 | Discharge: 2015-11-03 | DRG: 291 | Disposition: A | Discharge status: Home / Self Care | Payer: Self-pay | Attending: Internal Medicine | Admitting: Internal Medicine

## 2015-11-01 ENCOUNTER — Emergency Department: Payer: Self-pay

## 2015-11-01 DIAGNOSIS — D649 Anemia, unspecified: Secondary | ICD-10-CM | POA: Diagnosis present

## 2015-11-01 DIAGNOSIS — I130 Hypertensive heart and chronic kidney disease with heart failure and stage 1 through stage 4 chronic kidney disease, or unspecified chronic kidney disease: Principal | ICD-10-CM | POA: Diagnosis present

## 2015-11-01 DIAGNOSIS — E1122 Type 2 diabetes mellitus with diabetic chronic kidney disease: Secondary | ICD-10-CM | POA: Diagnosis present

## 2015-11-01 DIAGNOSIS — N183 Chronic kidney disease, stage 3 (moderate): Secondary | ICD-10-CM | POA: Diagnosis present

## 2015-11-01 DIAGNOSIS — E875 Hyperkalemia: Secondary | ICD-10-CM

## 2015-11-01 DIAGNOSIS — I252 Old myocardial infarction: Secondary | ICD-10-CM

## 2015-11-01 DIAGNOSIS — I5023 Acute on chronic systolic (congestive) heart failure: Secondary | ICD-10-CM | POA: Diagnosis present

## 2015-11-01 DIAGNOSIS — R600 Localized edema: Secondary | ICD-10-CM

## 2015-11-01 DIAGNOSIS — N179 Acute kidney failure, unspecified: Secondary | ICD-10-CM | POA: Diagnosis present

## 2015-11-01 DIAGNOSIS — Z803 Family history of malignant neoplasm of breast: Secondary | ICD-10-CM

## 2015-11-01 DIAGNOSIS — E1121 Type 2 diabetes mellitus with diabetic nephropathy: Secondary | ICD-10-CM | POA: Diagnosis present

## 2015-11-01 DIAGNOSIS — K219 Gastro-esophageal reflux disease without esophagitis: Secondary | ICD-10-CM | POA: Diagnosis present

## 2015-11-01 DIAGNOSIS — R609 Edema, unspecified: Secondary | ICD-10-CM

## 2015-11-01 DIAGNOSIS — E785 Hyperlipidemia, unspecified: Secondary | ICD-10-CM | POA: Diagnosis present

## 2015-11-01 DIAGNOSIS — Z8249 Family history of ischemic heart disease and other diseases of the circulatory system: Secondary | ICD-10-CM

## 2015-11-01 DIAGNOSIS — Z794 Long term (current) use of insulin: Secondary | ICD-10-CM

## 2015-11-01 DIAGNOSIS — E1142 Type 2 diabetes mellitus with diabetic polyneuropathy: Secondary | ICD-10-CM | POA: Diagnosis present

## 2015-11-01 DIAGNOSIS — Z800 Family history of malignant neoplasm of digestive organs: Secondary | ICD-10-CM

## 2015-11-01 DIAGNOSIS — I081 Rheumatic disorders of both mitral and tricuspid valves: Secondary | ICD-10-CM | POA: Diagnosis present

## 2015-11-01 DIAGNOSIS — Z86718 Personal history of other venous thrombosis and embolism: Secondary | ICD-10-CM

## 2015-11-01 DIAGNOSIS — Z801 Family history of malignant neoplasm of trachea, bronchus and lung: Secondary | ICD-10-CM

## 2015-11-01 DIAGNOSIS — Z79899 Other long term (current) drug therapy: Secondary | ICD-10-CM

## 2015-11-01 DIAGNOSIS — N189 Chronic kidney disease, unspecified: Secondary | ICD-10-CM

## 2015-11-01 DIAGNOSIS — R112 Nausea with vomiting, unspecified: Secondary | ICD-10-CM | POA: Diagnosis present

## 2015-11-01 HISTORY — DX: Acute myocardial infarction, unspecified: I21.9

## 2015-11-01 LAB — GLUCOSE, CAPILLARY: GLUCOSE-CAPILLARY: 126 mg/dL — AB (ref 65–99)

## 2015-11-01 LAB — CBC
HEMATOCRIT: 36.7 % — AB (ref 40.0–52.0)
HEMOGLOBIN: 11.4 g/dL — AB (ref 13.0–18.0)
MCH: 24.7 pg — ABNORMAL LOW (ref 26.0–34.0)
MCHC: 31.1 g/dL — AB (ref 32.0–36.0)
MCV: 79.5 fL — AB (ref 80.0–100.0)
Platelets: 180 10*3/uL (ref 150–440)
RBC: 4.62 MIL/uL (ref 4.40–5.90)
RDW: 17.3 % — AB (ref 11.5–14.5)
WBC: 7.1 10*3/uL (ref 3.8–10.6)

## 2015-11-01 LAB — TSH

## 2015-11-01 LAB — BASIC METABOLIC PANEL
Anion gap: 6 (ref 5–15)
BUN: 51 mg/dL — AB (ref 6–20)
CHLORIDE: 114 mmol/L — AB (ref 101–111)
CO2: 19 mmol/L — ABNORMAL LOW (ref 22–32)
Calcium: 8.8 mg/dL — ABNORMAL LOW (ref 8.9–10.3)
Creatinine, Ser: 2.59 mg/dL — ABNORMAL HIGH (ref 0.61–1.24)
GFR calc Af Amer: 32 mL/min — ABNORMAL LOW (ref >60)
GFR calc non Af Amer: 28 mL/min — ABNORMAL LOW (ref >60)
GLUCOSE: 120 mg/dL — AB (ref 65–99)
POTASSIUM: 5.7 mmol/L — AB (ref 3.5–5.1)
Sodium: 139 mmol/L (ref 135–145)

## 2015-11-01 LAB — MRSA PCR SCREENING: MRSA BY PCR: POSITIVE — AB

## 2015-11-01 LAB — TROPONIN I: Troponin I: 0.05 ng/mL — ABNORMAL HIGH (ref <0.031)

## 2015-11-01 LAB — BRAIN NATRIURETIC PEPTIDE: B Natriuretic Peptide: 415 pg/mL — ABNORMAL HIGH (ref 0.0–100.0)

## 2015-11-01 MED ORDER — PROMETHAZINE HCL 25 MG/ML IJ SOLN
12.5000 mg | Freq: Four times a day (QID) | INTRAMUSCULAR | Status: DC | PRN
  Administered 2015-11-01: 25 mg via INTRAVENOUS
  Filled 2015-11-01: qty 1

## 2015-11-01 MED ORDER — FUROSEMIDE 10 MG/ML IJ SOLN
40.0000 mg | Freq: Once | INTRAMUSCULAR | Status: AC
  Administered 2015-11-01: 40 mg via INTRAVENOUS
  Filled 2015-11-01: qty 4

## 2015-11-01 MED ORDER — DIPHENHYDRAMINE HCL 25 MG PO CAPS
25.0000 mg | ORAL_CAPSULE | Freq: Every evening | ORAL | Status: DC | PRN
  Administered 2015-11-01: 25 mg via ORAL
  Filled 2015-11-01: qty 1

## 2015-11-01 MED ORDER — ONDANSETRON HCL 4 MG/2ML IJ SOLN
4.0000 mg | Freq: Once | INTRAMUSCULAR | Status: AC
  Administered 2015-11-01: 4 mg via INTRAVENOUS

## 2015-11-01 MED ORDER — ACETAMINOPHEN 650 MG RE SUPP
650.0000 mg | Freq: Four times a day (QID) | RECTAL | Status: DC | PRN
Start: 2015-11-01 — End: 2015-11-03

## 2015-11-01 MED ORDER — MORPHINE SULFATE (PF) 2 MG/ML IV SOLN: 2.0000 mg | INTRAVENOUS | Status: DC | PRN

## 2015-11-01 MED ORDER — METOPROLOL TARTRATE 50 MG PO TABS
50.0000 mg | ORAL_TABLET | Freq: Two times a day (BID) | ORAL | Status: DC
  Administered 2015-11-01 – 2015-11-03 (×4): 50 mg via ORAL
  Filled 2015-11-01 (×4): qty 1

## 2015-11-01 MED ORDER — INSULIN ASPART 100 UNIT/ML ~~LOC~~ SOLN: 0.0000 [IU] | Freq: Every day | SUBCUTANEOUS | Status: DC

## 2015-11-01 MED ORDER — INSULIN ASPART 100 UNIT/ML ~~LOC~~ SOLN
0.0000 [IU] | Freq: Three times a day (TID) | SUBCUTANEOUS | Status: DC
  Administered 2015-11-02: 2 [IU] via SUBCUTANEOUS
  Filled 2015-11-01: qty 2

## 2015-11-01 MED ORDER — CLONIDINE HCL 0.1 MG PO TABS
0.2000 mg | ORAL_TABLET | Freq: Two times a day (BID) | ORAL | Status: DC
  Administered 2015-11-01 – 2015-11-02 (×2): 0.2 mg via ORAL
  Filled 2015-11-01 (×2): qty 2

## 2015-11-01 MED ORDER — INSULIN DEGLUDEC 200 UNIT/ML ~~LOC~~ SOPN: 25.0000 [IU] | PEN_INJECTOR | Freq: Every day | SUBCUTANEOUS | Status: DC

## 2015-11-01 MED ORDER — SODIUM CHLORIDE 0.9% FLUSH
3.0000 mL | Freq: Two times a day (BID) | INTRAVENOUS | Status: DC
  Administered 2015-11-02 – 2015-11-03 (×3): 3 mL via INTRAVENOUS

## 2015-11-01 MED ORDER — OXYCODONE HCL 5 MG PO TABS: 5.0000 mg | ORAL_TABLET | ORAL | Status: DC | PRN

## 2015-11-01 MED ORDER — HEPARIN SODIUM (PORCINE) 5000 UNIT/ML IJ SOLN
5000.0000 [IU] | Freq: Three times a day (TID) | INTRAMUSCULAR | Status: DC
  Administered 2015-11-01 – 2015-11-03 (×6): 5000 [IU] via SUBCUTANEOUS
  Filled 2015-11-01 (×6): qty 1

## 2015-11-01 MED ORDER — ONDANSETRON HCL 4 MG/2ML IJ SOLN
4.0000 mg | Freq: Four times a day (QID) | INTRAMUSCULAR | Status: DC | PRN
  Administered 2015-11-01: 4 mg via INTRAVENOUS
  Filled 2015-11-01: qty 2

## 2015-11-01 MED ORDER — SODIUM POLYSTYRENE SULFONATE 15 GM/60ML PO SUSP
15.0000 g | Freq: Once | ORAL | Status: AC
  Administered 2015-11-01: 15 g via ORAL
  Filled 2015-11-01: qty 60

## 2015-11-01 MED ORDER — HYDRALAZINE HCL 20 MG/ML IJ SOLN
10.0000 mg | INTRAMUSCULAR | Status: DC | PRN
  Administered 2015-11-01: 10 mg via INTRAVENOUS
  Filled 2015-11-01: qty 1

## 2015-11-01 MED ORDER — ONDANSETRON HCL 4 MG PO TABS: 4.0000 mg | ORAL_TABLET | Freq: Four times a day (QID) | ORAL | Status: DC | PRN

## 2015-11-01 MED ORDER — ACETAMINOPHEN 325 MG PO TABS: 650.0000 mg | ORAL_TABLET | Freq: Four times a day (QID) | ORAL | Status: DC | PRN

## 2015-11-01 MED ORDER — INSULIN GLARGINE 100 UNIT/ML ~~LOC~~ SOLN
25.0000 [IU] | Freq: Every day | SUBCUTANEOUS | Status: DC
  Administered 2015-11-01 – 2015-11-02 (×2): 25 [IU] via SUBCUTANEOUS
  Filled 2015-11-01 (×2): qty 0.25

## 2015-11-01 NOTE — Progress Notes (Signed)
Patient requesting something for sleep. MD paged. Orders received.

## 2015-11-01 NOTE — ED Notes (Addendum)
Has had shob with exertion. No respiratory distress in triage. Breathing unlabored.

## 2015-11-01 NOTE — ED Notes (Signed)
Pt c/o bilateral leg swelling for 2 months. Swelling up to groin per pt.  Denies any pain other than some leg pain if been on legs all day.  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 Denies heart failure.  Does have CKD.  Denies any other symptoms other than some redness in legs.

## 2015-11-01 NOTE — ED Provider Notes (Signed)
[facility] Emergency Department Provider Note  ____________________________________________  Time seen: Approximately 3:49 PM  I have reviewed the triage vital signs and the nursing notes.   HISTORY  Chief Complaint Leg Swelling    HPI Juan Roberson. is a 45 y.o. male with a history of diabetes hypertension and kidney failure who is presenting today with 2 months of increasing leg swelling. He says that the swelling goes down overnight but then increases throughout the day until his legs fill his jeans. He says that they become increasingly painful throughout the day as well. He says that he also has had increased shortness of breath. Denies any chest pain. Is a patient of Dr. Cherylann Ratel of the nephrology service. Not on dialysis. Says that he also had several episodes of vomiting this morning.   Past Medical History  Diagnosis Date  . Diabetes mellitus without complication (HCC)     a. Dx ~ 1996.  . Essential hypertension   . CKD (chronic kidney disease), stage III   . GERD (gastroesophageal reflux disease)   . Cellulitis and abscess of foot     Left-Dr. Ether Griffins  . Osteomyelitis (HCC)     a. 05/2015 L foot.  . Left leg DVT (HCC)     a. Dx 05/2015 -> Coumadin.  . Gastritis     a. 04/2015 hematemesis -> EGD: gastritis, esophagitis, duodenitis.  No active bleeding.  PPI added.  . MI (myocardial infarction) [facility])     Patient Active Problem List   Diagnosis Date Noted  . GI bleed 06/29/2015  . Abnormal finding on EKG   . Osteomyelitis of ankle or foot, acute (HCC) 05/18/2015  . Foot abscess, left 05/18/2015  . Hematemesis with nausea   . Gastritis   . Acute esophagitis   . Duodenitis   . Cellulitis 04/05/2015    Past Surgical History  Procedure Laterality Date  . I&d extremity Left 04/06/2015    Procedure: IRRIGATION AND DEBRIDEMENT EXTREMITY;  Surgeon: Gwyneth Revels, DPM;  Location: [facility] ORS;  Service: Podiatry;  Laterality: Left;  .  Esophagogastroduodenoscopy N/A 04/07/2015    Procedure: ESOPHAGOGASTRODUODENOSCOPY (EGD);  Surgeon: Midge Minium, MD;  Location: [facility] ENDOSCOPY;  Service: Endoscopy;  Laterality: N/A;  . Foot surgery    . Irrigation and debridement foot Left 05/21/2015    Procedure: IRRIGATION AND DEBRIDEMENT FOOT;  Surgeon: Linus Galas, MD;  Location: [facility] ORS;  Service: Podiatry;  Laterality: Left;  . Esophagogastroduodenoscopy (egd) with propofol Left 06/30/2015    Procedure: ESOPHAGOGASTRODUODENOSCOPY (EGD) WITH PROPOFOL;  Surgeon: Christena Deem, MD;  Location: [facility] ENDOSCOPY;  Service: Endoscopy;  Laterality: Left;    Current Outpatient Rx  Name  Route  Sig  Dispense  Refill  . albuterol (PROVENTIL) (2.5 MG/3ML) 0.083% nebulizer solution   Nebulization   Take 3 mLs (2.5 mg total) by nebulization every 2 (two) hours as needed for wheezing. Patient not taking: Reported on 06/28/2015   75 mL   12   . amLODipine (NORVASC) 10 MG tablet   Oral   Take 1 tablet (10 mg total) by mouth daily. Patient not taking: Reported on 05/18/2015   30 tablet   0   . atorvastatin (LIPITOR) 80 MG tablet   Oral   Take 1 tablet (80 mg total) by mouth daily at 6 PM. Patient not taking: Reported on 06/28/2015   30 tablet   3   . cloNIDine (CATAPRES) 0.2 MG tablet   Oral   Take 0.2 mg by  mouth 2 (two) times daily.       4   . doxycycline (VIBRAMYCIN) 100 MG capsule   Oral   Take 1 capsule by mouth 2 (two) times daily.         . insulin aspart (NOVOLOG) 100 UNIT/ML injection   Subcutaneous   Inject 0-9 Units into the skin 3 (three) times daily with meals. Patient not taking: Reported on 06/28/2015   10 mL   11   . metoprolol (LOPRESSOR) 50 MG tablet   Oral   Take 1 tablet (50 mg total) by mouth 2 (two) times daily.   60 tablet   0   . ondansetron (ZOFRAN) 4 MG tablet      TAKE 1 TABLET (4 MG TOTAL) BY MOUTH EVERY 8 (EIGHT) HOURS AS NEEDED FOR NAUSEA OR VOMITING.   20 tablet   1   . oxyCODONE (OXY  IR/ROXICODONE) 5 MG immediate release tablet   Oral   Take 1 tablet (5 mg total) by mouth every 4 (four) hours as needed for moderate pain.   30 tablet   0   . pantoprazole (PROTONIX) 40 MG tablet   Oral   Take 1 tablet (40 mg total) by mouth 2 (two) times daily.   30 tablet   0   . senna-docusate (SENOKOT-S) 8.6-50 MG per tablet   Oral   Take 2 tablets by mouth 2 (two) times daily. Patient not taking: Reported on 06/28/2015   60 tablet   2   . sucralfate (CARAFATE) 1 GM/10ML suspension   Oral   Take 10 mLs (1 g total) by mouth 4 (four) times daily -  with meals and at bedtime.   420 mL   0   . TRESIBA FLEXTOUCH 200 UNIT/ML SOPN   Subcutaneous   Inject 25 Units into the skin daily.       5     Dispense as written.     Allergies Sulfur  Family History  Problem Relation Age of Onset  . Coronary artery disease Father   . Pancreatic cancer Mother   . Breast cancer Sister   . Lung cancer Brother   . Pancreatic cancer Mother     Social History Social History  Substance Use Topics  . Smoking status: Never Smoker   . Smokeless tobacco: None  . Alcohol Use: No    Review of Systems Constitutional: No fever/chills Eyes: No visual changes. ENT: No sore throat. Cardiovascular: Denies chest pain. Respiratory: Denies shortness of breath. Gastrointestinal: No abdominal pain.  No diarrhea.  No constipation. Genitourinary: Negative for dysuria. Musculoskeletal: Negative for back pain. Skin: Negative for rash. Neurological: Negative for headaches, focal weakness or numbness.  10-point ROS otherwise negative.  ____________________________________________   PHYSICAL EXAM:  VITAL SIGNS: ED Triage Vitals  Enc Vitals Group     BP 11/01/15 1329 157/110 mmHg     Pulse Rate 11/01/15 1329 87     Resp 11/01/15 1329 18     Temp 11/01/15 1329 98 F (36.7 C)     Temp Source 11/01/15 1329 Oral     SpO2 11/01/15 1329 100 %     Weight 11/01/15 1329 216 lb (97.977 kg)      Height 11/01/15 1329  (1.88 m)     Head Cir --      Peak Flow --      Pain Score 11/01/15 1329 0     Pain Loc --      Pain Edu? --  Excl. in GC? --     Constitutional: Alert and oriented. Well appearing and in no acute distress. Eyes: Conjunctivae are normal. PERRL. EOMI. Head: Atraumatic. Nose: No congestion/rhinnorhea. Mouth/Throat: Mucous membranes are moist.  Oropharynx non-erythematous. Neck: No stridor.   Cardiovascular: Normal rate, regular rhythm. Grossly normal heart sounds.  Good peripheral circulation. Respiratory: Normal respiratory effort.  No retractions. Lungs CTAB. Gastrointestinal: Soft and nontender. No distention. No abdominal bruits. No CVA tenderness. Musculoskeletal: Bilateral lower extremities with moderate to severe edema from the ankles extending up to the mid thighs. The swelling is symmetric and there is no erythema or induration.  No joint effusions. Neurologic:  Normal speech and language. No gross focal neurologic deficits are appreciated. No gait instability. Skin:  Skin is warm, dry and intact. No rash noted. Psychiatric: Mood and affect are normal. Speech and behavior are normal.  ____________________________________________   LABS (all labs ordered are listed, but only abnormal results are displayed)  Labs Reviewed  BASIC METABOLIC PANEL - Abnormal; Notable for the following:    Potassium 5.7 (*)    Chloride 114 (*)    CO2 19 (*)    Glucose, Bld 120 (*)    BUN 51 (*)    Creatinine, Ser 2.59 (*)    Calcium 8.8 (*)    GFR calc non Af Amer 28 (*)    GFR calc Af Amer 32 (*)    All other components within normal limits  CBC - Abnormal; Notable for the following:    Hemoglobin 11.4 (*)    HCT 36.7 (*)    MCV 79.5 (*)    MCH 24.7 (*)    MCHC 31.1 (*)    RDW 17.3 (*)    All other components within normal limits  TROPONIN I - Abnormal; Notable for the following:    Troponin I 0.05 (*)    All other components within normal limits   BRAIN NATRIURETIC PEPTIDE - Abnormal; Notable for the following:    B Natriuretic Peptide 415.0 (*)    All other components within normal limits   ____________________________________________  EKG  ED ECG REPORT I, Arelia Longest, the attending physician, personally viewed and interpreted this ECG.   Date: 11/01/2015  EKG Time: 1335  Rate: 82  Rhythm: normal sinus rhythm  Axis: normal axis  Intervals:none  ST&T Change: No ST elevation or depression. T-wave inversions in leads V4 through 6. No T-wave peaking.  ____________________________________________  RADIOLOGY  No active cardiopulmonary disease. ____________________________________________   PROCEDURES   ____________________________________________   INITIAL IMPRESSION / ASSESSMENT AND PLAN / ED COURSE  Pertinent labs & imaging results that were available during my care of the patient were reviewed by me and considered in my medical decision making (see chart for details).  Patient with worsening renal failure with elevated potassium today. Will give Kayexalate. No EKG changes related to the hyperkalemia at this time. Signed out to Dr. Clint Guy. The patient understands plan for depression and is willing to comply. Nausea and vomiting likely related to patient's uremia. ____________________________________________   FINAL CLINICAL IMPRESSION(S) / ED DIAGNOSES  Acute on chronic kidney failure. Hyperkalemia. Uremia with nausea and vomiting. Lower extremity edema.    Myrna Blazer, MD 11/01/15 [phone]

## 2015-11-01 NOTE — H&P (Signed)
[facility] [place] [facility]   PATIENT NAME: Juan Roberson    MR#:  [id number]  DATE OF BIRTH:  Dec 07, 1970   DATE OF ADMISSION:  11/01/2015  PRIMARY CARE PHYSICIAN: Corky Downs, MD   REQUESTING/REFERRING PHYSICIAN: Schaevitz  CHIEF COMPLAINT:   Chief Complaint  Patient presents with  . Leg Swelling    HISTORY OF PRESENT ILLNESS:  Juan Roberson  is a 45 y.o. male with a known history of type 2 diabetes insulin requiring, essential hypertension is presenting with lower extremity edema. He states progressive lower extremity edema for approximately 2 months he has associated shortness of breath on exertion, orthopnea. Denies any chest pain or further symptomatology. Emergency department course noted to have mildly elevated potassium and worsening renal function.  PAST MEDICAL HISTORY:   Past Medical History  Diagnosis Date  . Diabetes mellitus without complication (HCC)     a. Dx ~ 1996.  . Essential hypertension   . CKD (chronic kidney disease), stage III   . GERD (gastroesophageal reflux disease)   . Cellulitis and abscess of foot     Left-Dr. Ether Griffins  . Osteomyelitis (HCC)     a. 05/2015 L foot.  . Left leg DVT (HCC)     a. Dx 05/2015 -> Coumadin.  . Gastritis     a. 04/2015 hematemesis -> EGD: gastritis, esophagitis, duodenitis.  No active bleeding.  PPI added.  . MI (myocardial infarction) (HCC)     PAST SURGICAL HISTORY:   Past Surgical History  Procedure Laterality Date  . I&d extremity Left 04/06/2015    Procedure: IRRIGATION AND DEBRIDEMENT EXTREMITY;  Surgeon: Gwyneth Revels, DPM;  Location: [facility] ORS;  Service: Podiatry;  Laterality: Left;  . Esophagogastroduodenoscopy N/A 04/07/2015    Procedure: ESOPHAGOGASTRODUODENOSCOPY (EGD);  Surgeon: Midge Minium, MD;  Location: [facility] ENDOSCOPY;  Service: Endoscopy;  Laterality: N/A;  . Foot surgery    . Irrigation and debridement foot Left 05/21/2015    Procedure: IRRIGATION AND DEBRIDEMENT  FOOT;  Surgeon: Linus Galas, MD;  Location: [facility] ORS;  Service: Podiatry;  Laterality: Left;  . Esophagogastroduodenoscopy (egd) with propofol Left 06/30/2015    Procedure: ESOPHAGOGASTRODUODENOSCOPY (EGD) WITH PROPOFOL;  Surgeon: Christena Deem, MD;  Location: [facility] ENDOSCOPY;  Service: Endoscopy;  Laterality: Left;    SOCIAL HISTORY:   Social History  Substance Use Topics  . Smoking status: Never Smoker   . Smokeless tobacco: Not on file  . Alcohol Use: No    FAMILY HISTORY:   Family History  Problem Relation Age of Onset  . Coronary artery disease Father   . Pancreatic cancer Mother   . Breast cancer Sister   . Lung cancer Brother   . Pancreatic cancer Mother     DRUG ALLERGIES:   Allergies  Allergen Reactions  . Sulfur Other (See Comments)    Pt states that this medication causes renal failure.      REVIEW OF SYSTEMS:  REVIEW OF SYSTEMS:  CONSTITUTIONAL: Denies fevers, chills, fatigue, weakness.  EYES: Denies blurred vision, double vision, or eye pain.  EARS, NOSE, THROAT: Denies tinnitus, ear pain, hearing loss.  RESPIRATORY: denies cough, positive shortness of breath, denies wheezing  CARDIOVASCULAR: Denies chest pain, palpitations, positive edema.  GASTROINTESTINAL: Denies nausea, vomiting, diarrhea, abdominal pain.  GENITOURINARY: Denies dysuria, hematuria.  ENDOCRINE: Denies nocturia or thyroid problems. HEMATOLOGIC AND LYMPHATIC: Denies easy bruising or bleeding.  SKIN: Denies rash or lesions.  MUSCULOSKELETAL: Denies pain in neck, back, shoulder, knees, hips, or further  arthritic symptoms.  NEUROLOGIC: Denies paralysis, paresthesias.  PSYCHIATRIC: Denies anxiety or depressive symptoms. Otherwise full review of systems performed by me is negative.   MEDICATIONS AT HOME:   Prior to Admission medications   Medication Sig Start Date End Date Taking? Authorizing Provider  cloNIDine (CATAPRES) 0.2 MG tablet Take 0.2 mg by mouth 2 (two) times daily.    Yes  Historical Provider, MD  metoprolol (LOPRESSOR) 50 MG tablet Take 1 tablet (50 mg total) by mouth 2 (two) times daily. 07/01/15  Yes Sital Mody, MD  TRESIBA FLEXTOUCH 200 UNIT/ML SOPN Inject 25 Units into the skin daily.    Yes Historical Provider, MD  albuterol (PROVENTIL) (2.5 MG/3ML) 0.083% nebulizer solution Take 3 mLs (2.5 mg total) by nebulization every 2 (two) hours as needed for wheezing. Patient not taking: Reported on 06/28/2015 05/24/15   Katharina Caper, MD  amLODipine (NORVASC) 10 MG tablet Take 1 tablet (10 mg total) by mouth daily. Patient not taking: Reported on 05/18/2015 04/08/15   Shaune Pollack, MD  atorvastatin (LIPITOR) 80 MG tablet Take 1 tablet (80 mg total) by mouth daily at 6 PM. Patient not taking: Reported on 06/28/2015 05/24/15   Katharina Caper, MD  insulin aspart (NOVOLOG) 100 UNIT/ML injection Inject 0-9 Units into the skin 3 (three) times daily with meals. Patient not taking: Reported on 06/28/2015 05/24/15   Katharina Caper, MD  oxyCODONE (OXY IR/ROXICODONE) 5 MG immediate release tablet Take 1 tablet (5 mg total) by mouth every 4 (four) hours as needed for moderate pain. Patient not taking: Reported on 11/01/2015 05/24/15   Katharina Caper, MD  pantoprazole (PROTONIX) 40 MG tablet Take 1 tablet (40 mg total) by mouth 2 (two) times daily. Patient not taking: Reported on 11/01/2015 07/01/15   Adrian Saran, MD  senna-docusate (SENOKOT-S) 8.6-50 MG per tablet Take 2 tablets by mouth 2 (two) times daily. Patient not taking: Reported on 06/28/2015 05/24/15   Katharina Caper, MD  sucralfate (CARAFATE) 1 GM/10ML suspension Take 10 mLs (1 g total) by mouth 4 (four) times daily -  with meals and at bedtime. Patient not taking: Reported on 11/01/2015 07/01/15   Adrian Saran, MD      VITAL SIGNS:  Blood pressure 157/110, pulse 87, temperature 98 F (36.7 C), temperature source Oral, resp. rate 18, height  (1.88 m), weight 216 lb (97.977 kg), SpO2 100 %.  PHYSICAL EXAMINATION:  VITAL SIGNS: Filed  Vitals:   11/01/15 1329  BP: 157/110  Pulse: 87  Temp: 98 F (36.7 C)  Resp: 18   GENERAL:44 y.o.male currently in no acute distress.  HEAD: Normocephalic, atraumatic.  EYES: Pupils equal, round, reactive to light. Extraocular muscles intact. No scleral icterus.  MOUTH: Moist mucosal membrane. Dentition intact. No abscess noted.  EAR, NOSE, THROAT: Clear without exudates. No external lesions.  NECK: Supple. No thyromegaly. No nodules. No JVD.  PULMONARY: Clear to ascultation, without wheeze rails or rhonci. No use of accessory muscles, Good respiratory effort. good air entry bilaterally CHEST: Nontender to palpation.  CARDIOVASCULAR: S1 and S2. Regular rate and rhythm. No murmurs, rubs, or gallops. Anasarca edema. Pedal pulses 2+ bilaterally.  GASTROINTESTINAL: Soft, nontender, nondistended. No masses. Positive bowel sounds. No hepatosplenomegaly.  MUSCULOSKELETAL: No swelling, clubbing, or edema. Range of motion full in all extremities.  NEUROLOGIC: Cranial nerves II through XII are intact. No gross focal neurological deficits. Sensation intact. Reflexes intact.  SKIN: No ulceration, lesions, rashes, or cyanosis. Skin warm and dry. Turgor intact.  PSYCHIATRIC: Mood, affect within  normal limits. The patient is awake, alert and oriented x 3. Insight, judgment intact.    LABORATORY PANEL:   CBC  Recent Labs Lab 11/01/15 1343  WBC 7.1  HGB 11.4*  HCT 36.7*  PLT 180   ------------------------------------------------------------------------------------------------------------------  Chemistries   Recent Labs Lab 11/01/15 1343  NA 139  K 5.7*  CL 114*  CO2 19*  GLUCOSE 120*  BUN 51*  CREATININE 2.59*  CALCIUM 8.8*   ------------------------------------------------------------------------------------------------------------------  Cardiac Enzymes  Recent Labs Lab 11/01/15 1343  TROPONINI 0.05*    ------------------------------------------------------------------------------------------------------------------  RADIOLOGY:  Dg Chest 2 View  11/01/2015  CLINICAL DATA:  Bilateral leg swelling for 2 months EXAM: CHEST  2 VIEW COMPARISON:  05/22/2015 FINDINGS: Mild enlargement of cardiac silhouette stable. Vascular pattern normal. No consolidation or infiltrate. Trace left pleural effusion or pleural thickening at the costophrenic angle stable. IMPRESSION: No active cardiopulmonary disease. Electronically Signed   By: Esperanza Heir M.D.   On: 11/01/2015 14:00    EKG:   Orders placed or performed during the hospital encounter of 11/01/15  . EKG 12-Lead  . EKG 12-Lead  . ED EKG within 10 minutes  . ED EKG within 10 minutes    IMPRESSION AND PLAN:   45 year old Caucasian gentleman history of chronic kidney disease presenting with edema  1. Acute kidney injury on chronic kidney disease likely secondary to acute on chronic diastolic congestive heart failure: Check echocardiogram will consult his nephrologist, continue with diuresis in hopes of increasing cardiac output and improving renal function. We will need to monitor renal function closely. Follow daily weights, input and output 2. Essential hypertension: Continue Norvasc and Catapres will hold off on adding ACE inhibitor given current kidney function 3. Type 2 diabetes insulin requiring: Continue basal insulin at insulin sliding scale 4. Hyperlipidemia unspecified Lipitor 5. Venous thromboembolic embolism prophylactic: Heparin subcutaneous    All the records are reviewed and case discussed with ED provider. Management plans discussed with the patient, family and they are in agreement.  CODE STATUS: Full  TOTAL TIME TAKING CARE OF THIS PATIENT: 45 minutes.    Hower,  Mardi Mainland.D on 11/01/2015 at 4:30 PM  Between 7am to 6pm - Pager - [phone]  After 6pm: House Pager: - [phone]  [facility]   Office  [phone]  CC: Primary care physician; Corky Downs, MD

## 2015-11-01 NOTE — ED Notes (Signed)
Dr Derrill Kay notified of troponin 0.05

## 2015-11-02 ENCOUNTER — Inpatient Hospital Stay
Admit: 2015-11-02 | Discharge: 2015-11-02 | Disposition: A | Discharge status: Home / Self Care | Payer: Self-pay | Attending: Internal Medicine | Admitting: Internal Medicine

## 2015-11-02 ENCOUNTER — Inpatient Hospital Stay: Payer: Self-pay

## 2015-11-02 LAB — GLUCOSE, CAPILLARY
GLUCOSE-CAPILLARY: 76 mg/dL (ref 65–99)
GLUCOSE-CAPILLARY: 173 mg/dL — AB (ref 65–99)
Glucose-Capillary: 168 mg/dL — ABNORMAL HIGH (ref 65–99)
Glucose-Capillary: 195 mg/dL — ABNORMAL HIGH (ref 65–99)

## 2015-11-02 LAB — BASIC METABOLIC PANEL
ANION GAP: 4 — AB (ref 5–15)
BUN: 51 mg/dL — ABNORMAL HIGH (ref 6–20)
CALCIUM: 8.2 mg/dL — AB (ref 8.9–10.3)
CO2: 21 mmol/L — ABNORMAL LOW (ref 22–32)
Chloride: 110 mmol/L (ref 101–111)
Creatinine, Ser: 2.58 mg/dL — ABNORMAL HIGH (ref 0.61–1.24)
GFR, EST AFRICAN AMERICAN: 33 mL/min — AB (ref >60)
GFR, EST NON AFRICAN AMERICAN: 28 mL/min — AB (ref >60)
Glucose, Bld: 106 mg/dL — ABNORMAL HIGH (ref 65–99)
Potassium: 4.1 mmol/L (ref 3.5–5.1)
SODIUM: 135 mmol/L (ref 135–145)

## 2015-11-02 LAB — CBC
HCT: 30.3 % — ABNORMAL LOW (ref 40.0–52.0)
HEMOGLOBIN: 9.6 g/dL — AB (ref 13.0–18.0)
MCH: 25.5 pg — ABNORMAL LOW (ref 26.0–34.0)
MCHC: 31.7 g/dL — ABNORMAL LOW (ref 32.0–36.0)
MCV: 80.5 fL (ref 80.0–100.0)
Platelets: 130 10*3/uL — ABNORMAL LOW (ref 150–440)
RBC: 3.76 MIL/uL — AB (ref 4.40–5.90)
RDW: 17.4 % — ABNORMAL HIGH (ref 11.5–14.5)
WBC: 6.4 10*3/uL (ref 3.8–10.6)

## 2015-11-02 LAB — HEMOGLOBIN A1C: Hgb A1c MFr Bld: 8.1 % — ABNORMAL HIGH (ref 4.0–6.0)

## 2015-11-02 MED ORDER — CLONIDINE HCL 0.1 MG PO TABS
0.2000 mg | ORAL_TABLET | Freq: Three times a day (TID) | ORAL | Status: DC
  Administered 2015-11-02 – 2015-11-03 (×3): 0.2 mg via ORAL
  Filled 2015-11-02 (×3): qty 2

## 2015-11-02 MED ORDER — FUROSEMIDE 40 MG PO TABS
40.0000 mg | ORAL_TABLET | Freq: Two times a day (BID) | ORAL | Status: DC
  Administered 2015-11-02 – 2015-11-03 (×2): 40 mg via ORAL
  Filled 2015-11-02 (×2): qty 1

## 2015-11-02 MED ORDER — INSULIN GLARGINE 100 UNIT/ML ~~LOC~~ SOLN
20.0000 [IU] | Freq: Every day | SUBCUTANEOUS | Status: DC
  Filled 2015-11-02: qty 0.2

## 2015-11-02 NOTE — Progress Notes (Signed)
*  PRELIMINARY RESULTS* Echocardiogram 2D Echocardiogram has been performed.  Georgann Housekeeper Hege 11/02/2015, 11:59 AM

## 2015-11-02 NOTE — Progress Notes (Signed)
[facility] - [place] at [facility]   PATIENT NAME: Juan Roberson    MR#:  [id number]  DATE OF BIRTH:  01/25/1971  SUBJECTIVE:  CHIEF COMPLAINT:   Chief Complaint  Patient presents with  . Leg Swelling   Edema better SOB improving REVIEW OF SYSTEMS:    Review of Systems  Constitutional: Positive for malaise/fatigue. Negative for fever and chills.  HENT: Negative for sore throat.   Eyes: Negative for blurred vision, double vision and pain.  Respiratory: Negative for cough, hemoptysis, shortness of breath and wheezing.   Cardiovascular: Negative for chest pain, palpitations, orthopnea and leg swelling.  Gastrointestinal: Positive for nausea. Negative for heartburn, vomiting, abdominal pain, diarrhea and constipation.  Genitourinary: Negative for dysuria and hematuria.  Musculoskeletal: Negative for back pain and joint pain.  Skin: Negative for rash.  Neurological: Negative for sensory change, speech change, focal weakness and headaches.  Endo/Heme/Allergies: Does not bruise/bleed easily.  Psychiatric/Behavioral: Negative for depression. The patient is not nervous/anxious.       DRUG ALLERGIES:   Allergies  Allergen Reactions  . Sulfur Other (See Comments)    Pt states that this medication causes renal failure.      VITALS:  Blood pressure 168/58, pulse 87, temperature 98.3 F (36.8 C), temperature source Oral, resp. rate 18, height  (1.88 m), weight 103.057 kg (227 lb 3.2 oz), SpO2 100 %.  PHYSICAL EXAMINATION:   Physical Exam  GENERAL:  45 y.o.-year-old patient lying in the bed with no acute distress.  EYES: Pupils equal, round, reactive to light and accommodation. No scleral icterus. Extraocular muscles intact.  HEENT: Head atraumatic, normocephalic. Oropharynx and nasopharynx clear.  NECK:  Supple, no jugular venous distention. No thyroid enlargement, no tenderness.  LUNGS: Normal breath sounds bilaterally, no wheezing, rales,  rhonchi. No use of accessory muscles of respiration.  CARDIOVASCULAR: S1, S2 normal. No murmurs, rubs, or gallops.  ABDOMEN: Soft, nontender, nondistended. Bowel sounds present. No organomegaly or mass.  EXTREMITIES: No cyanosis, clubbing. B/L LE edema NEUROLOGIC: Cranial nerves II through XII are intact. No focal Motor or sensory deficits b/l.   PSYCHIATRIC: The patient is alert and oriented x 3.  SKIN: No obvious rash, lesion, or ulcer.    LABORATORY PANEL:   CBC  Recent Labs Lab 11/02/15 0537  WBC 6.4  HGB 9.6*  HCT 30.3*  PLT 130*   ------------------------------------------------------------------------------------------------------------------  Chemistries   Recent Labs Lab 11/02/15 0537  NA 135  K 4.1  CL 110  CO2 21*  GLUCOSE 106*  BUN 51*  CREATININE 2.58*  CALCIUM 8.2*   ------------------------------------------------------------------------------------------------------------------  Cardiac Enzymes  Recent Labs Lab 11/01/15 1343  TROPONINI 0.05*   ------------------------------------------------------------------------------------------------------------------  RADIOLOGY:  Dg Chest 2 View  11/01/2015  CLINICAL DATA:  Bilateral leg swelling for 2 months EXAM: CHEST  2 VIEW COMPARISON:  05/22/2015 FINDINGS: Mild enlargement of cardiac silhouette stable. Vascular pattern normal. No consolidation or infiltrate. Trace left pleural effusion or pleural thickening at the costophrenic angle stable. IMPRESSION: No active cardiopulmonary disease. Electronically Signed   By: Esperanza Heir M.D.   On: 11/01/2015 14:00   US Venous Img Lower Bilateral  11/02/2015  CLINICAL DATA:  45 year old male with a history of left calf DVT 05/13/2015 (Peroneal vein). Worsening swelling. EXAM: BILATERAL LOWER EXTREMITY VENOUS DOPPLER ULTRASOUND TECHNIQUE: Gray-scale sonography with graded compression, as well as color Doppler and duplex ultrasound were performed to evaluate the  lower extremity deep venous systems from the level of the common femoral  vein and including the common femoral, femoral, profunda femoral, popliteal and calf veins including the posterior tibial, peroneal and gastrocnemius veins when visible. The superficial great saphenous vein was also interrogated. Spectral Doppler was utilized to evaluate flow at rest and with distal augmentation maneuvers in the common femoral, femoral and popliteal veins. COMPARISON:  05/13/2015, 06/29/2015 FINDINGS: RIGHT LOWER EXTREMITY Common Femoral Vein: No evidence of thrombus. Normal compressibility, respiratory phasicity and response to augmentation. Saphenofemoral Junction: No evidence of thrombus. Normal compressibility and flow on color Doppler imaging. Profunda Femoral Vein: No evidence of thrombus. Normal compressibility and flow on color Doppler imaging. Femoral Vein: No evidence of thrombus. Normal compressibility, respiratory phasicity and response to augmentation. Popliteal Vein: No evidence of thrombus. Normal compressibility, respiratory phasicity and response to augmentation. Calf Veins: No evidence of DVT involving the visualized posterior tibial vein and peroneal vein. Incomplete imaging given the edema. Superficial Great Saphenous Vein: No evidence of thrombus. Normal compressibility and flow on color Doppler imaging. Other Findings:  Significant edema right lower extremity. LEFT LOWER EXTREMITY Common Femoral Vein: No evidence of thrombus. Normal compressibility, respiratory phasicity and response to augmentation. Saphenofemoral Junction: No evidence of thrombus. Normal compressibility and flow on color Doppler imaging. Profunda Femoral Vein: No evidence of thrombus. Normal compressibility and flow on color Doppler imaging. Femoral Vein: No evidence of thrombus. Normal compressibility, respiratory phasicity and response to augmentation. Popliteal Vein: No evidence of thrombus. Normal compressibility, respiratory  phasicity and response to augmentation. Calf Veins: No evidence of DVT involving the visualized posterior tibial vein and peroneal vein. Incomplete imaging given edema. Superficial Great Saphenous Vein: No evidence of thrombus. Normal compressibility and flow on color Doppler imaging. Other Findings: Edema of the left lower extremity. Borderline enlarged lymph nodes of the left inguinal region. IMPRESSION: Sonographic survey of the bilateral lower extremities negative for DVT above the knee. No DVT involving the visualized left-sided calf veins (previous left peroneal vein thrombus August 2016), however, veins are incompletely imaged given significant edema. Limited visualization of right-sided calf veins, with no DVT identified on the current study. Significant bilateral calf edema. Signed, Yvone Neu. Loreta Ave, DO [facility] Electronically Signed   By: Gilmer Mor D.O.   On: 11/02/2015 12:54     ASSESSMENT AND PLAN:   45 year old Caucasian gentleman history of chronic kidney disease presenting with edema  # CKD3 - seems to have progressive worsening Now on increased lasix due to worsening CHF. Will likely worsen.  # Acute on chronic systolic chf On Lasix BID  # Essential hypertension, uncontrolled On Metoprolol and clonidine. Increase clonidine BID to TID  # Type 2 diabetes insulin requiring: Continue basal insulin at insulin sliding scale  # Hyperlipidemia unspecified Lipitor  # Venous thromboembolic embolism prophylactic: Heparin subcutaneous  All the records are reviewed and case discussed with Care Management/Social Worker. Management plans discussed with the patient, family and they are in agreement.  CODE STATUS: FULL  DVT Prophylaxis: SCDs  TOTAL TIME TAKING CARE OF THIS PATIENT: 35 minutes.   POSSIBLE D/C IN 1-2 DAYS, DEPENDING ON CLINICAL CONDITION.   Milagros Loll R M.D on 11/02/2015 at 5:23 PM  Between 7am to  6pm - Pager - [phone]  After 6pm go to [email or web address] - password [email or web address]  [facility]  Office  [phone]  CC: Primary care physician; Corky Downs, MD    Note: This dictation was prepared with Dragon dictation along with smaller phrase technology. Any transcriptional errors that result from this  process are unintentional.

## 2015-11-02 NOTE — Progress Notes (Signed)
Subjective:  Patient known to our practice from outpatient. He is followed by Dr. Cherylann Ratel. He was last seen in June 2016 Nephrology consult has been requested for acute kidney injury His baseline creatinine appears to be 1.9/GFR of 34 from Nov 2016 Admission creatinine was 2.59, today's creatinine is 2.58 Patient's main complaint for admission was worsening lower extremity edema He was given IV Lasix which did help in reducing some swelling He denies any shortness of breath   Objective:  Vital signs in last 24 hours:  Temp:  [97.6 F (36.4 C)-98.7 F (37.1 C)] 97.6 F (36.4 C) (01/31 0822) Pulse Rate:  [85-123] 85 (01/31 0822) Resp:  [17-19] 17 (01/31 0822) BP: (145-185)/(75-117) 158/92 mmHg (01/31 0822) SpO2:  [96 %-100 %] 97 % (01/31 0822) Weight:  [97.977 kg (216 lb)-103.057 kg (227 lb 3.2 oz)] 103.057 kg (227 lb 3.2 oz) (01/31 0329)  Weight change:  Filed Weights   11/01/15 1329 11/02/15 0329  Weight: 97.977 kg (216 lb) 103.057 kg (227 lb 3.2 oz)    Intake/Output:   No intake or output data in the 24 hours ending 11/02/15 1211   Physical Exam: General:  no acute distress, laying in the bed   HEENT  anicteric   Neck  supple   Pulm/lungs  mild basilar crackles bilaterally   CVS/Heart  irregular rhythm, hyperdynamic, 3/6 systolic murmur   Abdomen:   soft, nontender, nondistended   Extremities:  2-3+ pitting edema bilaterally   Neurologic:  alert, oriented   Skin:  multiple tattoos   Access:        Basic Metabolic Panel:   Recent Labs Lab 11/01/15 1343 11/02/15 0537  NA 139 135  K 5.7* 4.1  CL 114* 110  CO2 19* 21*  GLUCOSE 120* 106*  BUN 51* 51*  CREATININE 2.59* 2.58*  CALCIUM 8.8* 8.2*     CBC:  Recent Labs Lab 11/01/15 1343 11/02/15 0537  WBC 7.1 6.4  HGB 11.4* 9.6*  HCT 36.7* 30.3*  MCV 79.5* 80.5  PLT 180 130*      Microbiology:  Recent Results (from the past 720 hour(s))  MRSA PCR Screening     Status: Abnormal   Collection  Time: 11/01/15  7:40 PM  Result Value Ref Range Status   MRSA by PCR POSITIVE (A) NEGATIVE Final    Comment:        The GeneXpert MRSA Assay (FDA approved for NASAL specimens only), is one component of a comprehensive MRSA colonization surveillance program. It is not intended to diagnose MRSA infection nor to guide or monitor treatment for MRSA infections. CRITICAL RESULT CALLED TO, READ BACK BY AND VERIFIED WITH: DEBRA GLADDEN ON 11/01/15 AT 2119 BY TLB     Coagulation Studies: No results for input(s): LABPROT, INR in the last 72 hours.  Urinalysis: No results for input(s): COLORURINE, LABSPEC, PHURINE, GLUCOSEU, HGBUR, BILIRUBINUR, KETONESUR, PROTEINUR, UROBILINOGEN, NITRITE, LEUKOCYTESUR in the last 72 hours.  Invalid input(s): APPERANCEUR    Imaging: Dg Chest 2 View  11/01/2015  CLINICAL DATA:  Bilateral leg swelling for 2 months EXAM: CHEST  2 VIEW COMPARISON:  05/22/2015 FINDINGS: Mild enlargement of cardiac silhouette stable. Vascular pattern normal. No consolidation or infiltrate. Trace left pleural effusion or pleural thickening at the costophrenic angle stable. IMPRESSION: No active cardiopulmonary disease. Electronically Signed   By: Esperanza Heir M.D.   On: 11/01/2015 14:00     Medications:     . cloNIDine  0.2 mg Oral BID  . heparin  5,000 Units Subcutaneous 3 times per day  . insulin aspart  0-5 Units Subcutaneous QHS  . insulin aspart  0-9 Units Subcutaneous TID WC  . insulin glargine  25 Units Subcutaneous Daily  . metoprolol  50 mg Oral BID  . sodium chloride flush  3 mL Intravenous Q12H   acetaminophen **OR** acetaminophen, diphenhydrAMINE, hydrALAZINE, morphine injection, ondansetron **OR** ondansetron (ZOFRAN) IV, oxyCODONE, promethazine  Assessment/ Plan:  45 y.o. male with past medical history of hypertension, diabetes mellitus type 2, chronic kidney disease stage III, peripheral neuropathy, proteinuria, mitral and tricuspid regurgitation noted on  echo in August 2016   1. Chronic kidney disease stage III/proteinuria.  Baseline creatinine appears to be 1.9/GFR of 34 from September 2016  Recent worsening is likely secondary to hemodynamic changes from cardiac valvular abnormalities   2. Proteinuria Urine protein/creatinine ratio 0.9 g Likely secondary to diabetic nephropathy   3. Peripheral edema Likely secondary to excessive salt intake as it developed during holiday season Patient was taking Lasix only once a day He has multiple cardiac valvular abnormalities including mitral and tricuspid regurgitation which are likely contributing to peripheral edema. Patient would need to be set up with CHF clinic as well as low need cardiology input regarding valvular abnormalities  Recommend strict salt restriction, start Lasix 40 twice a day  Ultrasound abdomen to assess liver and spleen status    4. Diabetes type 2 with nephropathy Last hemoglobin A1c 6.8% in September 2016 Repeat evaluation     LOS: 1 Gautham Hewins 1/31/201712:11 PM

## 2015-11-02 NOTE — Consult Note (Signed)
Reason for Consult: congestive heart failure leg edema Referring Physician:  Dr. Lavera Guise primary,  Dr. Lavetta Nielsen  hospitalist  Juan Roberson Juan Roberson. is an 45 y.o. male.  HPI:  Patient was seen by his primary physician today for leg edema shortness of breath weight gain. Patient complains of worsening shortness of breath dyspnea and significant leg swelling primary physician and evaluated and thought that he should come for further evaluation and care. Patient states he has been getting the worsening dyspnea for on any activity over the last 2-3 weeks. Denies any chest pain no blackout spells of syncope. Here for evaluation of heart failure  Was found have renal insufficiency as well. Patient has significant anemia as well.  Past Medical History  Diagnosis Date  . Diabetes mellitus without complication ([place])     a. Dx ~ 1996.  . Essential hypertension   . CKD (chronic kidney disease), stage III   . GERD (gastroesophageal reflux disease)   . Cellulitis and abscess of foot     Left-Dr. Vickki Muff  . Osteomyelitis ([place])     a. 05/2015 L foot.  . Left leg DVT ([place])     a. Dx 05/2015 -> Coumadin.  . Gastritis     a. 04/2015 hematemesis -> EGD: gastritis, esophagitis, duodenitis.  No active bleeding.  PPI added.  . MI (myocardial infarction) [facility])     Past Surgical History  Procedure Laterality Date  . I&d extremity Left 04/06/2015    Procedure: IRRIGATION AND DEBRIDEMENT EXTREMITY;  Surgeon: Samara Deist, DPM;  Location: [facility] ORS;  Service: Podiatry;  Laterality: Left;  . Esophagogastroduodenoscopy N/A 04/07/2015    Procedure: ESOPHAGOGASTRODUODENOSCOPY (EGD);  Surgeon: Lucilla Lame, MD;  Location: [facility] ENDOSCOPY;  Service: Endoscopy;  Laterality: N/A;  . Foot surgery    . Irrigation and debridement foot Left 05/21/2015    Procedure: IRRIGATION AND DEBRIDEMENT FOOT;  Surgeon: Sharlotte Alamo, MD;  Location: [facility] ORS;  Service: Podiatry;  Laterality: Left;  . Esophagogastroduodenoscopy (egd) with propofol Left  06/30/2015    Procedure: ESOPHAGOGASTRODUODENOSCOPY (EGD) WITH PROPOFOL;  Surgeon: Lollie Sails, MD;  Location: [facility] ENDOSCOPY;  Service: Endoscopy;  Laterality: Left;    Family History  Problem Relation Age of Onset  . Coronary artery disease Father   . Pancreatic cancer Mother   . Breast cancer Sister   . Lung cancer Brother   . Pancreatic cancer Mother     Social History:  reports that he has never smoked. He does not have any smokeless tobacco history on file. He reports that he does not drink alcohol or use illicit drugs.  Allergies:  Allergies  Allergen Reactions  . Sulfur Other (See Comments)    Pt states that this medication causes renal failure.      Medications: I have reviewed the patient's current medications.  Results for orders placed or performed during the hospital encounter of 11/01/15 (from the past 48 hour(s))  Basic metabolic panel     Status: Abnormal   Collection Time: 11/01/15  1:43 PM  Result Value Ref Range   Sodium 139 135 - 145 mmol/L   Potassium 5.7 (H) 3.5 - 5.1 mmol/L   Chloride 114 (H) 101 - 111 mmol/L   CO2 19 (L) 22 - 32 mmol/L   Glucose, Bld 120 (H) 65 - 99 mg/dL   BUN 51 (H) 6 - 20 mg/dL   Creatinine, Ser 2.59 (H) 0.61 - 1.24 mg/dL   Calcium 8.8 (L) 8.9 - 10.3 mg/dL   GFR calc  non Af Amer 28 (L) >60 mL/min   GFR calc Af Amer 32 (L) >60 mL/min    Comment: (NOTE) The eGFR has been calculated using the CKD EPI equation. This calculation has not been validated in all clinical situations. eGFR's persistently <60 mL/min signify possible Chronic Kidney Disease.    Anion gap 6 5 - 15  CBC     Status: Abnormal   Collection Time: 11/01/15  1:43 PM  Result Value Ref Range   WBC 7.1 3.8 - 10.6 K/uL   RBC 4.62 4.40 - 5.90 MIL/uL   Hemoglobin 11.4 (L) 13.0 - 18.0 g/dL   HCT 36.7 (L) 40.0 - 52.0 %   MCV 79.5 (L) 80.0 - 100.0 fL   MCH 24.7 (L) 26.0 - 34.0 pg   MCHC 31.1 (L) 32.0 - 36.0 g/dL   RDW 17.3 (H) 11.5 - 14.5 %   Platelets 180 150  - 440 K/uL  Troponin I     Status: Abnormal   Collection Time: 11/01/15  1:43 PM  Result Value Ref Range   Troponin I 0.05 (H) <0.031 ng/mL    Comment: READ BACK AND VERIFIED WITH MALLORY CHANDLER AT 7425 11/01/15 MLZ        PERSISTENTLY INCREASED TROPONIN VALUES IN THE RANGE OF 0.04-0.49 ng/mL CAN BE SEEN IN:       -UNSTABLE ANGINA       -CONGESTIVE HEART FAILURE       -MYOCARDITIS       -CHEST TRAUMA       -ARRYHTHMIAS       -LATE PRESENTING MYOCARDIAL INFARCTION       -COPD   CLINICAL FOLLOW-UP RECOMMENDED.   Brain natriuretic peptide     Status: Abnormal   Collection Time: 11/01/15  1:43 PM  Result Value Ref Range   B Natriuretic Peptide 415.0 (H) 0.0 - 100.0 pg/mL  TSH     Status: Abnormal   Collection Time: 11/01/15  1:43 PM  Result Value Ref Range   TSH <0.010 (L) 0.350 - 4.500 uIU/mL  Hemoglobin A1c     Status: Abnormal   Collection Time: 11/01/15  1:43 PM  Result Value Ref Range   Hgb A1c MFr Bld 8.1 (H) 4.0 - 6.0 %  MRSA PCR Screening     Status: Abnormal   Collection Time: 11/01/15  7:40 PM  Result Value Ref Range   MRSA by PCR POSITIVE (A) NEGATIVE    Comment:        The GeneXpert MRSA Assay (FDA approved for NASAL specimens only), is one component of a comprehensive MRSA colonization surveillance program. It is not intended to diagnose MRSA infection nor to guide or monitor treatment for MRSA infections. CRITICAL RESULT CALLED TO, READ BACK BY AND VERIFIED WITH: DEBRA GLADDEN ON 11/01/15 AT 2119 BY TLB   Glucose, capillary     Status: Abnormal   Collection Time: 11/01/15  9:58 PM  Result Value Ref Range   Glucose-Capillary 126 (H) 65 - 99 mg/dL   Comment 1 Notify RN   Basic metabolic panel     Status: Abnormal   Collection Time: 11/02/15  5:37 AM  Result Value Ref Range   Sodium 135 135 - 145 mmol/L   Potassium 4.1 3.5 - 5.1 mmol/L   Chloride 110 101 - 111 mmol/L   CO2 21 (L) 22 - 32 mmol/L   Glucose, Bld 106 (H) 65 - 99 mg/dL   BUN 51 (H) 6 -  20 mg/dL  Creatinine, Ser 2.58 (H) 0.61 - 1.24 mg/dL   Calcium 8.2 (L) 8.9 - 10.3 mg/dL   GFR calc non Af Amer 28 (L) >60 mL/min   GFR calc Af Amer 33 (L) >60 mL/min    Comment: (NOTE) The eGFR has been calculated using the CKD EPI equation. This calculation has not been validated in all clinical situations. eGFR's persistently <60 mL/min signify possible Chronic Kidney Disease.    Anion gap 4 (L) 5 - 15  CBC     Status: Abnormal   Collection Time: 11/02/15  5:37 AM  Result Value Ref Range   WBC 6.4 3.8 - 10.6 K/uL   RBC 3.76 (L) 4.40 - 5.90 MIL/uL   Hemoglobin 9.6 (L) 13.0 - 18.0 g/dL   HCT 30.3 (L) 40.0 - 52.0 %   MCV 80.5 80.0 - 100.0 fL   MCH 25.5 (L) 26.0 - 34.0 pg   MCHC 31.7 (L) 32.0 - 36.0 g/dL   RDW 17.4 (H) 11.5 - 14.5 %   Platelets 130 (L) 150 - 440 K/uL  Glucose, capillary     Status: None   Collection Time: 11/02/15  7:50 AM  Result Value Ref Range   Glucose-Capillary 76 65 - 99 mg/dL   Comment 1 Notify RN   Glucose, capillary     Status: Abnormal   Collection Time: 11/02/15 10:27 AM  Result Value Ref Range   Glucose-Capillary 168 (H) 65 - 99 mg/dL  Glucose, capillary     Status: Abnormal   Collection Time: 11/02/15  4:18 PM  Result Value Ref Range   Glucose-Capillary 195 (H) 65 - 99 mg/dL    Dg Chest 2 View  11/01/2015  CLINICAL DATA:  Bilateral leg swelling for 2 months EXAM: CHEST  2 VIEW COMPARISON:  05/22/2015 FINDINGS: Mild enlargement of cardiac silhouette stable. Vascular pattern normal. No consolidation or infiltrate. Trace left pleural effusion or pleural thickening at the costophrenic angle stable. IMPRESSION: No active cardiopulmonary disease. Electronically Signed   By: Skipper Cliche M.D.   On: 11/01/2015 14:00   US Venous Img Lower Bilateral  11/02/2015  CLINICAL DATA:  45 year old male with a history of left calf DVT 05/13/2015 (Peroneal vein). Worsening swelling. EXAM: BILATERAL LOWER EXTREMITY VENOUS DOPPLER ULTRASOUND TECHNIQUE: Gray-scale  sonography with graded compression, as well as color Doppler and duplex ultrasound were performed to evaluate the lower extremity deep venous systems from the level of the common femoral vein and including the common femoral, femoral, profunda femoral, popliteal and calf veins including the posterior tibial, peroneal and gastrocnemius veins when visible. The superficial great saphenous vein was also interrogated. Spectral Doppler was utilized to evaluate flow at rest and with distal augmentation maneuvers in the common femoral, femoral and popliteal veins. COMPARISON:  05/13/2015, 06/29/2015 FINDINGS: RIGHT LOWER EXTREMITY Common Femoral Vein: No evidence of thrombus. Normal compressibility, respiratory phasicity and response to augmentation. Saphenofemoral Junction: No evidence of thrombus. Normal compressibility and flow on color Doppler imaging. Profunda Femoral Vein: No evidence of thrombus. Normal compressibility and flow on color Doppler imaging. Femoral Vein: No evidence of thrombus. Normal compressibility, respiratory phasicity and response to augmentation. Popliteal Vein: No evidence of thrombus. Normal compressibility, respiratory phasicity and response to augmentation. Calf Veins: No evidence of DVT involving the visualized posterior tibial vein and peroneal vein. Incomplete imaging given the edema. Superficial Great Saphenous Vein: No evidence of thrombus. Normal compressibility and flow on color Doppler imaging. Other Findings:  Significant edema right lower extremity. LEFT LOWER EXTREMITY Common Femoral  Vein: No evidence of thrombus. Normal compressibility, respiratory phasicity and response to augmentation. Saphenofemoral Junction: No evidence of thrombus. Normal compressibility and flow on color Doppler imaging. Profunda Femoral Vein: No evidence of thrombus. Normal compressibility and flow on color Doppler imaging. Femoral Vein: No evidence of thrombus. Normal compressibility, respiratory phasicity  and response to augmentation. Popliteal Vein: No evidence of thrombus. Normal compressibility, respiratory phasicity and response to augmentation. Calf Veins: No evidence of DVT involving the visualized posterior tibial vein and peroneal vein. Incomplete imaging given edema. Superficial Great Saphenous Vein: No evidence of thrombus. Normal compressibility and flow on color Doppler imaging. Other Findings: Edema of the left lower extremity. Borderline enlarged lymph nodes of the left inguinal region. IMPRESSION: Sonographic survey of the bilateral lower extremities negative for DVT above the knee. No DVT involving the visualized left-sided calf veins (previous left peroneal vein thrombus August 2016), however, veins are incompletely imaged given significant edema. Limited visualization of right-sided calf veins, with no DVT identified on the current study. Significant bilateral calf edema. Signed, Dulcy Fanny. Earleen Newport, DO [facility] Electronically Signed   By: Corrie Mckusick D.O.   On: 11/02/2015 12:54    Review of Systems  Constitutional: Positive for malaise/fatigue.  HENT: Positive for congestion.   Eyes: Negative.   Respiratory: Positive for shortness of breath.   Cardiovascular: Positive for orthopnea, leg swelling and PND.  Gastrointestinal: Negative.   Genitourinary: Negative.   Musculoskeletal: Negative.   Skin: Negative.   Neurological: Positive for weakness.  Endo/Heme/Allergies: Negative.   Psychiatric/Behavioral: Negative.    Blood pressure 168/58, pulse 87, temperature 98.3 F (36.8 C), temperature source Oral, resp. rate 18, height '6\' 2"'$  (1.88 m), weight 103.057 kg (227 lb 3.2 oz), SpO2 100 %. Physical Exam  Constitutional: He is oriented to person, place, and time. He appears well-developed and well-nourished.  HENT:  Head: Normocephalic and atraumatic.  Right Ear: External ear normal.  Eyes: Conjunctivae and EOM are normal.  Pupils are equal, round, and reactive to light.  Neck: Normal range of motion. Neck supple.  Cardiovascular: Regular rhythm, S1 normal, S2 normal and normal pulses.  PMI is displaced.  Exam reveals gallop, S3 and S4.   Murmur heard.  Systolic murmur is present with a grade of 2/6  Respiratory: Effort normal and breath sounds normal.  GI: Soft. Bowel sounds are normal.  Musculoskeletal: He exhibits edema.  Neurological: He is alert and oriented to person, place, and time. He has normal reflexes.  Skin: Skin is warm and dry.  Psychiatric: He has a normal mood and affect.    Assessment/Plan:  congestive heart failure  shortness of breath  leg edema  acute on chronic renal insufficiency  hypertension  diabetes type 2   PLAN  agree with admission  continue Nephrology involvement for renal insufficiency  recommend intravenous diuretics for heart failure  continue hydralazine consider adding Imdur  For heart failure  agree with insulin therapy for diabetes management  continue clonidine for blood pressure support and control  consider support stockings  continue metoprolol hydralazine and clonidine hypertension  agree with echocardiogram for evaluation of heart failure  CALLWOOD,DWAYNE D. 11/02/2015, 5:21 PM

## 2015-11-02 NOTE — Care Management (Signed)
Met with patient (woman at bedside asleep) to discuss discharge planning. He denies RNCM needs. States he "will have [employer] like before". It is through is job.

## 2015-11-02 NOTE — Progress Notes (Signed)
Initial Nutrition Assessment   INTERVENTION:   Meals and Snacks: Cater to patient preferences; pt may benefit from addition of Carb Modified diet order Education: will review on follow as able   NUTRITION DIAGNOSIS:   Altered nutrition lab value related to chronic illness as evidenced by  (Electrolyte and renal Profile, lower extremity edema).  GOAL:   Patient will meet greater than or equal to 90% of their needs  MONITOR:    (Energy Intake, electrolyte and renal Profile, Glucose Profile, Anthropometrics)  REASON FOR ASSESSMENT:   Diagnosis    ASSESSMENT:   Pt admitted with 2 months of lower extremity edema with SOB, admitted with acute kidney injury; nephrology following.  Past Medical History  Diagnosis Date  . Diabetes mellitus without complication ([facility])     a. Dx ~ 1996.  . Essential hypertension   . CKD (chronic kidney disease), stage III   . GERD (gastroesophageal reflux disease)   . Cellulitis and abscess of foot     Left-Dr. Ether Griffins  . Osteomyelitis ([facility])     a. 05/2015 L foot.  . Left leg DVT ([facility])     a. Dx 05/2015 -> Coumadin.  . Gastritis     a. 04/2015 hematemesis -> EGD: gastritis, esophagitis, duodenitis.  No active bleeding.  PPI added.  . MI (myocardial infarction) [facility])      Diet Order:  Diet Heart Room service appropriate?: Yes; Fluid consistency:: Thin    Current Nutrition: Limited documentation as pt on isolation. RD observed breakfast tray this am however lid still covered. MD in with pt on rounds.  Food/Nutrition-Related History: Per MST pt with no decrease in appetite PTA.   Scheduled Medications:  . cloNIDine  0.2 mg Oral TID  . furosemide  40 mg Oral BID  . heparin  5,000 Units Subcutaneous 3 times per day  . insulin aspart  0-5 Units Subcutaneous QHS  . insulin aspart  0-9 Units Subcutaneous TID WC  . [START ON 11/03/2015] insulin glargine  20 Units Subcutaneous Daily  . metoprolol  50 mg Oral BID  . sodium chloride flush  3 mL  Intravenous Q12H     Electrolyte/Renal Profile and Glucose Profile:   Recent Labs Lab 11/01/15 1343 11/02/15 0537  NA 139 135  K 5.7* 4.1  CL 114* 110  CO2 19* 21*  BUN 51* 51*  CREATININE 2.59* 2.58*  CALCIUM 8.8* 8.2*  GLUCOSE 120* 106*   Protein Profile: No results for input(s): ALBUMIN in the last 168 hours.  Gastrointestinal Profile: Last BM:  11/02/2015   Nutrition-Focused Physical Exam Findings:  Unable to complete Nutrition-Focused physical exam at this time.    Weight Change: Pt with weight gain per [facility] weight encounters in the past few months.  Height:   Ht Readings from Last 1 Encounters:  11/01/15  (1.88 m)    Weight:   Wt Readings from Last 1 Encounters:  11/02/15 227 lb 3.2 oz (103.057 kg)    Wt Readings from Last 10 Encounters:  11/02/15 227 lb 3.2 oz (103.057 kg)  07/01/15 196 lb 4.8 oz (89.041 kg)  05/25/15 208 lb 3.2 oz (94.439 kg)  05/18/15 200 lb (90.719 kg)  04/07/15 215 lb 6.4 oz (97.705 kg)  04/02/15 220 lb (99.791 kg)     BMI:  Body mass index is 29.16 kg/(m^2).   EDUCATION NEEDS:   Education needs no appropriate at this time   LOW Care Level  Leda Quail, [place], LDN Pager [phone] Weekend/On-Call Pager (  [phone]  

## 2015-11-03 LAB — BASIC METABOLIC PANEL
Anion gap: 3 — ABNORMAL LOW (ref 5–15)
BUN: 52 mg/dL — ABNORMAL HIGH (ref 6–20)
CO2: 22 mmol/L (ref 22–32)
Calcium: 8.1 mg/dL — ABNORMAL LOW (ref 8.9–10.3)
Chloride: 115 mmol/L — ABNORMAL HIGH (ref 101–111)
Creatinine, Ser: 2.51 mg/dL — ABNORMAL HIGH (ref 0.61–1.24)
GFR calc non Af Amer: 29 mL/min — ABNORMAL LOW (ref >60)
GFR, EST AFRICAN AMERICAN: 34 mL/min — AB (ref >60)
GLUCOSE: 101 mg/dL — AB (ref 65–99)
Potassium: 4.2 mmol/L (ref 3.5–5.1)
Sodium: 140 mmol/L (ref 135–145)

## 2015-11-03 LAB — GLUCOSE, CAPILLARY: Glucose-Capillary: 172 mg/dL — ABNORMAL HIGH (ref 65–99)

## 2015-11-03 MED ORDER — ATORVASTATIN CALCIUM 80 MG PO TABS: 80.0000 mg | ORAL_TABLET | Freq: Every day | ORAL | Status: DC

## 2015-11-03 MED ORDER — PANTOPRAZOLE SODIUM 40 MG PO TBEC: 40.0000 mg | DELAYED_RELEASE_TABLET | Freq: Every day | ORAL | Status: DC

## 2015-11-03 MED ORDER — CLONIDINE HCL 0.2 MG PO TABS: 0.2000 mg | ORAL_TABLET | Freq: Three times a day (TID) | ORAL | Status: DC

## 2015-11-03 MED ORDER — FUROSEMIDE 40 MG PO TABS: 40.0000 mg | ORAL_TABLET | Freq: Two times a day (BID) | ORAL | Status: DC

## 2015-11-03 MED ORDER — METOCLOPRAMIDE HCL 5 MG PO TABS: 5.0000 mg | ORAL_TABLET | Freq: Four times a day (QID) | ORAL | Status: DC | PRN

## 2015-11-03 NOTE — Discharge Instructions (Signed)

## 2015-11-03 NOTE — Progress Notes (Signed)
Subjective:  Patient known to our practice from outpatient. He is followed by Dr. Cherylann Ratel. He was last seen in June 2016 Nephrology consult has been requested for acute kidney injury His baseline creatinine appears to be 1.9/GFR of 34 from Nov 2016 Admission creatinine was 2.59, today's creatinine is 2.51 Patient's main complaint for admission was worsening lower extremity edema He was given IV Lasix which did help in reducing some swelling He denies any shortness of breath Today, he feels like th leg edema is slightly better  Objective:  Vital signs in last 24 hours:  Temp:  [96.6 F (35.9 C)-98.5 F (36.9 C)] 97.5 F (36.4 C) (02/01 1108) Pulse Rate:  [87-105] 100 (02/01 1108) Resp:  [17-20] 18 (02/01 1106) BP: (136-168)/(45-94) 156/82 mmHg (02/01 1106) SpO2:  [98 %-100 %] 98 % (02/01 1106) Weight:  [102.967 kg (227 lb)] 102.967 kg (227 lb) (02/01 0300)  Weight change: 4.99 kg (11 lb) Filed Weights   11/01/15 1329 11/02/15 0329 11/03/15 0300  Weight: 97.977 kg (216 lb) 103.057 kg (227 lb 3.2 oz) 102.967 kg (227 lb)    Intake/Output:    Intake/Output Summary (Last 24 hours) at 11/03/15 1213 Last data filed at 11/03/15 0358  Gross per 24 hour  Intake      3 ml  Output      0 ml  Net      3 ml     Physical Exam: General:  no acute distress, laying in the bed   HEENT  anicteric   Neck  supple   Pulm/lungs  mild basilar crackles bilaterally   CVS/Heart  irregular rhythm, hyperdynamic, 3/6 systolic murmur   Abdomen:   soft, nontender, nondistended   Extremities:  2-3+ pitting edema bilaterally   Neurologic:  alert, oriented   Skin:  multiple tattoos   Access:        Basic Metabolic Panel:   Recent Labs Lab 11/01/15 1343 11/02/15 0537 11/03/15 0606  NA 139 135 140  K 5.7* 4.1 4.2  CL 114* 110 115*  CO2 19* 21* 22  GLUCOSE 120* 106* 101*  BUN 51* 51* 52*  CREATININE 2.59* 2.58* 2.51*  CALCIUM 8.8* 8.2* 8.1*     CBC:  Recent Labs Lab  11/01/15 1343 11/02/15 0537  WBC 7.1 6.4  HGB 11.4* 9.6*  HCT 36.7* 30.3*  MCV 79.5* 80.5  PLT 180 130*      Microbiology:  Recent Results (from the past 720 hour(s))  MRSA PCR Screening     Status: Abnormal   Collection Time: 11/01/15  7:40 PM  Result Value Ref Range Status   MRSA by PCR POSITIVE (A) NEGATIVE Final    Comment:        The GeneXpert MRSA Assay (FDA approved for NASAL specimens only), is one component of a comprehensive MRSA colonization surveillance program. It is not intended to diagnose MRSA infection nor to guide or monitor treatment for MRSA infections. CRITICAL RESULT CALLED TO, READ BACK BY AND VERIFIED WITH: DEBRA GLADDEN ON 11/01/15 AT 2119 BY TLB     Coagulation Studies: No results for input(s): LABPROT, INR in the last 72 hours.  Urinalysis: No results for input(s): COLORURINE, LABSPEC, PHURINE, GLUCOSEU, HGBUR, BILIRUBINUR, KETONESUR, PROTEINUR, UROBILINOGEN, NITRITE, LEUKOCYTESUR in the last 72 hours.  Invalid input(s): APPERANCEUR    Imaging: Dg Chest 2 View  11/01/2015  CLINICAL DATA:  Bilateral leg swelling for 2 months EXAM: CHEST  2 VIEW COMPARISON:  05/22/2015 FINDINGS: Mild enlargement of cardiac silhouette stable.  Vascular pattern normal. No consolidation or infiltrate. Trace left pleural effusion or pleural thickening at the costophrenic angle stable. IMPRESSION: No active cardiopulmonary disease. Electronically Signed   By: Esperanza Heir M.D.   On: 11/01/2015 14:00   US Venous Img Lower Bilateral  11/02/2015  CLINICAL DATA:  45 year old male with a history of left calf DVT 05/13/2015 (Peroneal vein). Worsening swelling. EXAM: BILATERAL LOWER EXTREMITY VENOUS DOPPLER ULTRASOUND TECHNIQUE: Gray-scale sonography with graded compression, as well as color Doppler and duplex ultrasound were performed to evaluate the lower extremity deep venous systems from the level of the common femoral vein and including the common femoral, femoral,  profunda femoral, popliteal and calf veins including the posterior tibial, peroneal and gastrocnemius veins when visible. The superficial great saphenous vein was also interrogated. Spectral Doppler was utilized to evaluate flow at rest and with distal augmentation maneuvers in the common femoral, femoral and popliteal veins. COMPARISON:  05/13/2015, 06/29/2015 FINDINGS: RIGHT LOWER EXTREMITY Common Femoral Vein: No evidence of thrombus. Normal compressibility, respiratory phasicity and response to augmentation. Saphenofemoral Junction: No evidence of thrombus. Normal compressibility and flow on color Doppler imaging. Profunda Femoral Vein: No evidence of thrombus. Normal compressibility and flow on color Doppler imaging. Femoral Vein: No evidence of thrombus. Normal compressibility, respiratory phasicity and response to augmentation. Popliteal Vein: No evidence of thrombus. Normal compressibility, respiratory phasicity and response to augmentation. Calf Veins: No evidence of DVT involving the visualized posterior tibial vein and peroneal vein. Incomplete imaging given the edema. Superficial Great Saphenous Vein: No evidence of thrombus. Normal compressibility and flow on color Doppler imaging. Other Findings:  Significant edema right lower extremity. LEFT LOWER EXTREMITY Common Femoral Vein: No evidence of thrombus. Normal compressibility, respiratory phasicity and response to augmentation. Saphenofemoral Junction: No evidence of thrombus. Normal compressibility and flow on color Doppler imaging. Profunda Femoral Vein: No evidence of thrombus. Normal compressibility and flow on color Doppler imaging. Femoral Vein: No evidence of thrombus. Normal compressibility, respiratory phasicity and response to augmentation. Popliteal Vein: No evidence of thrombus. Normal compressibility, respiratory phasicity and response to augmentation. Calf Veins: No evidence of DVT involving the visualized posterior tibial vein and  peroneal vein. Incomplete imaging given edema. Superficial Great Saphenous Vein: No evidence of thrombus. Normal compressibility and flow on color Doppler imaging. Other Findings: Edema of the left lower extremity. Borderline enlarged lymph nodes of the left inguinal region. IMPRESSION: Sonographic survey of the bilateral lower extremities negative for DVT above the knee. No DVT involving the visualized left-sided calf veins (previous left peroneal vein thrombus August 2016), however, veins are incompletely imaged given significant edema. Limited visualization of right-sided calf veins, with no DVT identified on the current study. Significant bilateral calf edema. Signed, Yvone Neu. Loreta Ave, DO [facility] Electronically Signed   By: Gilmer Mor D.O.   On: 11/02/2015 12:54     Medications:     . cloNIDine  0.2 mg Oral TID  . furosemide  40 mg Oral BID  . heparin  5,000 Units Subcutaneous 3 times per day  . insulin aspart  0-5 Units Subcutaneous QHS  . insulin aspart  0-9 Units Subcutaneous TID WC  . insulin glargine  20 Units Subcutaneous Daily  . metoprolol  50 mg Oral BID  . sodium chloride flush  3 mL Intravenous Q12H   acetaminophen **OR** acetaminophen, diphenhydrAMINE, hydrALAZINE, morphine injection, ondansetron **OR** ondansetron (ZOFRAN) IV, oxyCODONE, promethazine  Assessment/ Plan:  45 y.o. male with past medical history of hypertension,  diabetes mellitus type 2, chronic kidney disease stage III, peripheral neuropathy, proteinuria, EF 35-40% mitral and tricuspid regurgitation noted on echo in August 2016   1. Chronic kidney disease stage III/proteinuria.  Baseline creatinine appears to be 1.9/GFR of 34 from September 2016  Recent worsening is likely secondary to hemodynamic changes from cardiac valvular abnormalities   2. Proteinuria Urine protein/creatinine ratio 0.9 g Likely secondary to diabetic nephropathy   3.  Peripheral edema Likely secondary to excessive salt intake as it developed during holiday season Patient was taking Lasix only once a day He has multiple cardiac valvular abnormalities including mitral and tricuspid regurgitation which are likely contributing to peripheral edema. Patient would need to be set up with CHF clinic as well as low need cardiology input regarding valvular abnormalities  Recommend strict salt restriction,  Continue  Lasix 40 twice a day    4. Diabetes type 2 with nephropathy Last hemoglobin A1c 6.8% in September 2016  F/u outpatient     LOS: 2 Christeen Lai 2/1/201712:13 PM

## 2015-11-03 NOTE — Progress Notes (Signed)
DISCHARGE NOTE:  Pt given discharge instructions and prescriptions. Pt verbalized understanding. Pt wheeled to car.  

## 2015-11-09 NOTE — Discharge Summary (Signed)
[facility] [place] at [facility]   PATIENT NAME: Juan Roberson    MR#:  [id number]  DATE OF BIRTH:  1970/12/02  DATE OF ADMISSION:  11/01/2015 ADMITTING PHYSICIAN: Wyatt Haste, MD  DATE OF DISCHARGE: 11/03/2015 11:17 AM  PRIMARY CARE PHYSICIAN: MASOUD,JAVED, MD    ADMISSION DIAGNOSIS:  Hyperkalemia [E87.5] Acute on chronic kidney failure (HCC) [N17.9, N18.9] Bilateral edema of lower extremity [R60.0] Non-intractable vomiting with nausea, vomiting of unspecified type [R11.2]  DISCHARGE DIAGNOSIS:  Active Problems:   AKI (acute kidney injury) (HCC)   Hyperkalemia   SECONDARY DIAGNOSIS:   Past Medical History  Diagnosis Date  . Diabetes mellitus without complication (HCC)     a. Dx ~ 1996.  . Essential hypertension   . CKD (chronic kidney disease), stage III   . GERD (gastroesophageal reflux disease)   . Cellulitis and abscess of foot     Left-Dr. Ether Griffins  . Osteomyelitis (HCC)     a. 05/2015 L foot.  . Left leg DVT (HCC)     a. Dx 05/2015 -> Coumadin.  . Gastritis     a. 04/2015 hematemesis -> EGD: gastritis, esophagitis, duodenitis.  No active bleeding.  PPI added.  . MI (myocardial infarction) (HCC)      ADMITTING HISTORY  Juan Roberson is a 45 y.o. male with a known history of type 2 diabetes insulin requiring, essential hypertension is presenting with lower extremity edema. He states progressive lower extremity edema for approximately 2 months he has associated shortness of breath on exertion, orthopnea. Denies any chest pain or further symptomatology. Emergency department course noted to have mildly elevated potassium and worsening renal function.   HOSPITAL COURSE:   45 year old Caucasian gentleman history of chronic kidney disease presenting with edema  # CKD3 - seems to have progressive worsening Now on increased lasix due to worsening CHF. Cr remained stable in hospital  # Acute on chronic systolic chf On Lasix BID PO at  discharge LE dopplers were done showing no DVT  # Essential hypertension, uncontrolled On Metoprolol and clonidine. Increase clonidine BID to TID  # Type 2 diabetes insulin requiring: Continue basal insulin at insulin sliding scale  # Hyperlipidemia unspecified Lipitor  # Venous thromboembolic embolism prophylactic: Heparin subcutaneous in hospital  Stable for discharge home to follow with PCP, Cardiology and Nephrology   CONSULTS OBTAINED:  Treatment Team:  Wyatt Haste, MD Mosetta Pigeon, MD Alwyn Pea, MD  DRUG ALLERGIES:   Allergies  Allergen Reactions  . Sulfur Other (See Comments)    Pt states that this medication causes renal failure.      DISCHARGE MEDICATIONS:   Discharge Medication List as of 11/03/2015 10:58 AM    START taking these medications   Details  furosemide (LASIX) 40 MG tablet Take 1 tablet (40 mg total) by mouth 2 (two) times daily., Starting 11/03/2015, Until Discontinued, Print    metoCLOPramide (REGLAN) 5 MG tablet Take 1 tablet (5 mg total) by mouth every 6 (six) hours as needed for nausea or vomiting., Starting 11/03/2015, Until Discontinued, Normal      CONTINUE these medications which have CHANGED   Details  atorvastatin (LIPITOR) 80 MG tablet Take 1 tablet (80 mg total) by mouth daily at 6 PM., Starting 11/03/2015, Until Discontinued, Normal    cloNIDine (CATAPRES) 0.2 MG tablet Take 1 tablet (0.2 mg total) by mouth 3 (three) times daily., Starting 11/03/2015, Until Discontinued, No Print    pantoprazole (PROTONIX) 40 MG tablet Take  1 tablet (40 mg total) by mouth daily., Starting 11/03/2015, Until Discontinued, Print      CONTINUE these medications which have NOT CHANGED   Details  metoprolol (LOPRESSOR) 50 MG tablet Take 1 tablet (50 mg total) by mouth 2 (two) times daily., Starting 07/01/2015, Until Discontinued, Normal    TRESIBA FLEXTOUCH 200 UNIT/ML SOPN Inject 25 Units into the skin daily. , Until Discontinued, Historical Med     insulin aspart (NOVOLOG) 100 UNIT/ML injection Inject 0-9 Units into the skin 3 (three) times daily with meals., Starting 05/24/2015, Until Discontinued, Normal      STOP taking these medications     albuterol (PROVENTIL) (2.5 MG/3ML) 0.083% nebulizer solution      amLODipine (NORVASC) 10 MG tablet      oxyCODONE (OXY IR/ROXICODONE) 5 MG immediate release tablet      senna-docusate (SENOKOT-S) 8.6-50 MG per tablet      sucralfate (CARAFATE) 1 GM/10ML suspension          Today    VITAL SIGNS:  Blood pressure 156/82, pulse 100, temperature 97.5 F (36.4 C), temperature source Oral, resp. rate 18, height  (1.88 m), weight 102.967 kg (227 lb), SpO2 98 %.  I/O:  No intake or output data in the 24 hours ending 11/09/15 1503  PHYSICAL EXAMINATION:  Physical Exam  GENERAL:  45 y.o.-year-old patient lying in the bed with no acute distress.  LUNGS: Normal breath sounds bilaterally, no wheezing, rales,rhonchi or crepitation. No use of accessory muscles of respiration.  CARDIOVASCULAR: S1, S2 normal. No murmurs, rubs, or gallops. B/L LE edema improved ABDOMEN: Soft, non-tender, non-distended. Bowel sounds present. No organomegaly or mass.  NEUROLOGIC: Moves all 4 extremities. PSYCHIATRIC: The patient is alert and awake SKIN: No obvious rash, lesion, or ulcer.   DATA REVIEW:   CBC No results for input(s): WBC, HGB, HCT, PLT in the last 168 hours.  Chemistries   Recent Labs Lab 11/03/15 0606  NA 140  K 4.2  CL 115*  CO2 22  GLUCOSE 101*  BUN 52*  CREATININE 2.51*  CALCIUM 8.1*    Cardiac Enzymes No results for input(s): TROPONINI in the last 168 hours.  Microbiology Results  Results for orders placed or performed during the hospital encounter of 11/01/15  MRSA PCR Screening     Status: Abnormal   Collection Time: 11/01/15  7:40 PM  Result Value Ref Range Status   MRSA by PCR POSITIVE (A) NEGATIVE Final    Comment:        The GeneXpert MRSA Assay  (FDA approved for NASAL specimens only), is one component of a comprehensive MRSA colonization surveillance program. It is not intended to diagnose MRSA infection nor to guide or monitor treatment for MRSA infections. CRITICAL RESULT CALLED TO, READ BACK BY AND VERIFIED WITH: DEBRA GLADDEN ON 11/01/15 AT 2119 BY TLB     RADIOLOGY:  No results found.    Follow up with PCP in 1 week.  Management plans discussed with the patient, family and they are in agreement.  CODE STATUS:  Code Status History    Date Active Date Inactive Code Status Order ID Comments User Context   11/01/2015  4:00 PM 11/03/2015  2:28 PM Full Code [id number]  Wyatt Haste, MD ED   06/29/2015  2:30 AM 07/01/2015  2:21 PM Full Code [id number]  Arnaldo Natal, MD Inpatient   05/21/2015  4:34 PM 05/25/2015  4:34 PM Full Code [id number]  Linus Galas, MD Inpatient  05/18/2015  9:17 PM 05/21/2015  4:34 PM Full Code [id number]  Shaune Pollack, MD Inpatient   04/05/2015  7:05 AM 04/08/2015  2:00 PM Full Code [id number]  Arnaldo Natal, MD Inpatient      TOTAL TIME TAKING CARE OF THIS PATIENT ON DAY OF DISCHARGE: more than 30 minutes.    Milagros Loll R M.D on 11/09/2015 at 3:03 PM  Between 7am to 6pm - Pager - [phone]  After 6pm go to [email or web address] - password [email or web address]  [facility]  Office  [phone]  CC: Primary care physician; Corky Downs, MD     Note: This dictation was prepared with Dragon dictation along with smaller phrase technology. Any transcriptional errors that result from this process are unintentional.

## 2015-12-06 ENCOUNTER — Inpatient Hospital Stay
Admission: EM | Admit: 2015-12-06 | Discharge: 2015-12-08 | DRG: 603 | Disposition: A | Discharge status: Home / Self Care | Payer: BLUE CROSS/BLUE SHIELD | Attending: Internal Medicine | Admitting: Internal Medicine

## 2015-12-06 DIAGNOSIS — M1990 Unspecified osteoarthritis, unspecified site: Secondary | ICD-10-CM | POA: Diagnosis present

## 2015-12-06 DIAGNOSIS — Z794 Long term (current) use of insulin: Secondary | ICD-10-CM

## 2015-12-06 DIAGNOSIS — E1122 Type 2 diabetes mellitus with diabetic chronic kidney disease: Secondary | ICD-10-CM | POA: Diagnosis present

## 2015-12-06 DIAGNOSIS — Z79899 Other long term (current) drug therapy: Secondary | ICD-10-CM | POA: Diagnosis not present

## 2015-12-06 DIAGNOSIS — Z86718 Personal history of other venous thrombosis and embolism: Secondary | ICD-10-CM

## 2015-12-06 DIAGNOSIS — I509 Heart failure, unspecified: Secondary | ICD-10-CM | POA: Diagnosis present

## 2015-12-06 DIAGNOSIS — Z9889 Other specified postprocedural states: Secondary | ICD-10-CM | POA: Diagnosis not present

## 2015-12-06 DIAGNOSIS — Z801 Family history of malignant neoplasm of trachea, bronchus and lung: Secondary | ICD-10-CM | POA: Diagnosis not present

## 2015-12-06 DIAGNOSIS — L0390 Cellulitis, unspecified: Secondary | ICD-10-CM

## 2015-12-06 DIAGNOSIS — Z8249 Family history of ischemic heart disease and other diseases of the circulatory system: Secondary | ICD-10-CM

## 2015-12-06 DIAGNOSIS — Z803 Family history of malignant neoplasm of breast: Secondary | ICD-10-CM

## 2015-12-06 DIAGNOSIS — K219 Gastro-esophageal reflux disease without esophagitis: Secondary | ICD-10-CM | POA: Diagnosis present

## 2015-12-06 DIAGNOSIS — N183 Chronic kidney disease, stage 3 (moderate): Secondary | ICD-10-CM | POA: Diagnosis present

## 2015-12-06 DIAGNOSIS — Z7901 Long term (current) use of anticoagulants: Secondary | ICD-10-CM

## 2015-12-06 DIAGNOSIS — Z888 Allergy status to other drugs, medicaments and biological substances status: Secondary | ICD-10-CM | POA: Diagnosis not present

## 2015-12-06 DIAGNOSIS — I252 Old myocardial infarction: Secondary | ICD-10-CM

## 2015-12-06 DIAGNOSIS — L03211 Cellulitis of face: Secondary | ICD-10-CM | POA: Diagnosis not present

## 2015-12-06 DIAGNOSIS — I130 Hypertensive heart and chronic kidney disease with heart failure and stage 1 through stage 4 chronic kidney disease, or unspecified chronic kidney disease: Secondary | ICD-10-CM | POA: Diagnosis present

## 2015-12-06 DIAGNOSIS — E785 Hyperlipidemia, unspecified: Secondary | ICD-10-CM | POA: Diagnosis present

## 2015-12-06 DIAGNOSIS — E1165 Type 2 diabetes mellitus with hyperglycemia: Secondary | ICD-10-CM | POA: Diagnosis present

## 2015-12-06 DIAGNOSIS — Z800 Family history of malignant neoplasm of digestive organs: Secondary | ICD-10-CM

## 2015-12-06 LAB — GLUCOSE, CAPILLARY
GLUCOSE-CAPILLARY: 347 mg/dL — AB (ref 65–99)
GLUCOSE-CAPILLARY: 277 mg/dL — AB (ref 65–99)
GLUCOSE-CAPILLARY: 508 mg/dL — AB (ref 65–99)
Glucose-Capillary: 492 mg/dL — ABNORMAL HIGH (ref 65–99)
Glucose-Capillary: 514 mg/dL — ABNORMAL HIGH (ref 65–99)
Glucose-Capillary: 533 mg/dL — ABNORMAL HIGH (ref 65–99)

## 2015-12-06 LAB — MRSA PCR SCREENING: MRSA by PCR: POSITIVE — AB

## 2015-12-06 LAB — CBC WITH DIFFERENTIAL/PLATELET
BASOS PCT: 0 %
Basophils Absolute: 0 10*3/uL (ref 0–0.1)
Eosinophils Absolute: 0 10*3/uL (ref 0–0.7)
Eosinophils Relative: 0 %
HEMATOCRIT: 30.2 % — AB (ref 40.0–52.0)
HEMOGLOBIN: 9.7 g/dL — AB (ref 13.0–18.0)
LYMPHS ABS: 1.4 10*3/uL (ref 1.0–3.6)
Lymphocytes Relative: 13 %
MCH: 25.1 pg — AB (ref 26.0–34.0)
MCHC: 32 g/dL (ref 32.0–36.0)
MCV: 78.4 fL — ABNORMAL LOW (ref 80.0–100.0)
MONOS PCT: 8 %
Monocytes Absolute: 0.8 10*3/uL (ref 0.2–1.0)
NEUTROS ABS: 8.1 10*3/uL — AB (ref 1.4–6.5)
NEUTROS PCT: 79 %
Platelets: 133 10*3/uL — ABNORMAL LOW (ref 150–440)
RBC: 3.85 MIL/uL — ABNORMAL LOW (ref 4.40–5.90)
RDW: 16.9 % — ABNORMAL HIGH (ref 11.5–14.5)
WBC: 10.3 10*3/uL (ref 3.8–10.6)

## 2015-12-06 LAB — PROTIME-INR
INR: 2.58
Prothrombin Time: 27.3 seconds — ABNORMAL HIGH (ref 11.4–15.0)

## 2015-12-06 LAB — RAPID INFLUENZA A&B ANTIGENS: Influenza A (ARMC): NEGATIVE

## 2015-12-06 LAB — BASIC METABOLIC PANEL
ANION GAP: 9 (ref 5–15)
BUN: 42 mg/dL — ABNORMAL HIGH (ref 6–20)
CHLORIDE: 100 mmol/L — AB (ref 101–111)
CO2: 24 mmol/L (ref 22–32)
Calcium: 8.6 mg/dL — ABNORMAL LOW (ref 8.9–10.3)
Creatinine, Ser: 2.24 mg/dL — ABNORMAL HIGH (ref 0.61–1.24)
GFR calc non Af Amer: 33 mL/min — ABNORMAL LOW (ref >60)
GFR, EST AFRICAN AMERICAN: 39 mL/min — AB (ref >60)
Glucose, Bld: 498 mg/dL — ABNORMAL HIGH (ref 65–99)
Potassium: 3.8 mmol/L (ref 3.5–5.1)
Sodium: 133 mmol/L — ABNORMAL LOW (ref 135–145)

## 2015-12-06 LAB — RAPID INFLUENZA A&B ANTIGENS (ARMC ONLY): INFLUENZA B (ARMC): NEGATIVE

## 2015-12-06 MED ORDER — INSULIN ASPART 100 UNIT/ML ~~LOC~~ SOLN
SUBCUTANEOUS | Status: AC
  Administered 2015-12-06: 6 [IU] via INTRAVENOUS
  Filled 2015-12-06: qty 6

## 2015-12-06 MED ORDER — METOCLOPRAMIDE HCL 10 MG PO TABS: 5.0000 mg | ORAL_TABLET | Freq: Four times a day (QID) | ORAL | Status: DC | PRN

## 2015-12-06 MED ORDER — CARVEDILOL 6.25 MG PO TABS
6.2500 mg | ORAL_TABLET | Freq: Two times a day (BID) | ORAL | Status: DC
  Administered 2015-12-06 – 2015-12-07 (×2): 6.25 mg via ORAL
  Filled 2015-12-06 (×2): qty 1

## 2015-12-06 MED ORDER — VANCOMYCIN HCL IN DEXTROSE 1-5 GM/200ML-% IV SOLN
1000.0000 mg | Freq: Once | INTRAVENOUS | Status: AC
  Administered 2015-12-06: 1000 mg via INTRAVENOUS
  Filled 2015-12-06: qty 200

## 2015-12-06 MED ORDER — INSULIN ASPART 100 UNIT/ML ~~LOC~~ SOLN
10.0000 [IU] | Freq: Once | SUBCUTANEOUS | Status: AC
  Administered 2015-12-06: 10 [IU] via SUBCUTANEOUS
  Filled 2015-12-06: qty 10

## 2015-12-06 MED ORDER — INSULIN ASPART 100 UNIT/ML ~~LOC~~ SOLN
6.0000 [IU] | Freq: Once | SUBCUTANEOUS | Status: AC
  Administered 2015-12-06: 6 [IU] via INTRAVENOUS

## 2015-12-06 MED ORDER — DIPHENHYDRAMINE HCL 50 MG/ML IJ SOLN
50.0000 mg | Freq: Once | INTRAMUSCULAR | Status: AC
  Administered 2015-12-06: 50 mg via INTRAVENOUS
  Filled 2015-12-06: qty 1

## 2015-12-06 MED ORDER — INSULIN ASPART 100 UNIT/ML ~~LOC~~ SOLN
0.0000 [IU] | Freq: Three times a day (TID) | SUBCUTANEOUS | Status: DC
  Filled 2015-12-06 (×6): qty 0.09

## 2015-12-06 MED ORDER — INFLUENZA VAC SPLIT QUAD 0.5 ML IM SUSY
0.5000 mL | PREFILLED_SYRINGE | INTRAMUSCULAR | Status: AC
  Administered 2015-12-07: 0.5 mL via INTRAMUSCULAR
  Filled 2015-12-06: qty 0.5

## 2015-12-06 MED ORDER — SODIUM CHLORIDE 0.9 % IV SOLN
3.0000 g | Freq: Four times a day (QID) | INTRAVENOUS | Status: DC
  Administered 2015-12-06 – 2015-12-08 (×7): 3 g via INTRAVENOUS
  Filled 2015-12-06 (×10): qty 3

## 2015-12-06 MED ORDER — CHLORHEXIDINE GLUCONATE CLOTH 2 % EX PADS
6.0000 | MEDICATED_PAD | Freq: Every day | CUTANEOUS | Status: DC
  Administered 2015-12-07 – 2015-12-08 (×2): 6 via TOPICAL

## 2015-12-06 MED ORDER — LOSARTAN POTASSIUM 50 MG PO TABS
25.0000 mg | ORAL_TABLET | Freq: Every day | ORAL | Status: DC
  Administered 2015-12-06 – 2015-12-08 (×3): 25 mg via ORAL
  Filled 2015-12-06: qty 1
  Filled 2015-12-06 (×2): qty 2

## 2015-12-06 MED ORDER — PANTOPRAZOLE SODIUM 40 MG PO TBEC
40.0000 mg | DELAYED_RELEASE_TABLET | Freq: Every day | ORAL | Status: DC
  Administered 2015-12-06 – 2015-12-08 (×3): 40 mg via ORAL
  Filled 2015-12-06 (×3): qty 1

## 2015-12-06 MED ORDER — INSULIN GLARGINE 100 UNIT/ML ~~LOC~~ SOLN
10.0000 [IU] | Freq: Every day | SUBCUTANEOUS | Status: DC
  Administered 2015-12-06: 10 [IU] via SUBCUTANEOUS
  Filled 2015-12-06: qty 0.1

## 2015-12-06 MED ORDER — METHYLPREDNISOLONE SODIUM SUCC 125 MG IJ SOLR
125.0000 mg | Freq: Once | INTRAMUSCULAR | Status: AC
  Administered 2015-12-06: 125 mg via INTRAVENOUS
  Filled 2015-12-06 (×2): qty 2

## 2015-12-06 MED ORDER — DIPHENHYDRAMINE HCL 25 MG PO CAPS: 25.0000 mg | ORAL_CAPSULE | Freq: Four times a day (QID) | ORAL | Status: DC | PRN

## 2015-12-06 MED ORDER — HYDRALAZINE HCL 20 MG/ML IJ SOLN
10.0000 mg | Freq: Four times a day (QID) | INTRAMUSCULAR | Status: DC | PRN
  Filled 2015-12-06: qty 0.5

## 2015-12-06 MED ORDER — MUPIROCIN 2 % EX OINT
1.0000 "application " | TOPICAL_OINTMENT | Freq: Two times a day (BID) | CUTANEOUS | Status: DC
  Administered 2015-12-07 – 2015-12-08 (×4): 1 via NASAL
  Filled 2015-12-06: qty 22

## 2015-12-06 MED ORDER — FUROSEMIDE 40 MG PO TABS
40.0000 mg | ORAL_TABLET | Freq: Two times a day (BID) | ORAL | Status: DC
  Administered 2015-12-07 – 2015-12-08 (×3): 40 mg via ORAL
  Filled 2015-12-06 (×3): qty 1

## 2015-12-06 MED ORDER — WARFARIN SODIUM 4 MG PO TABS
4.0000 mg | ORAL_TABLET | Freq: Every day | ORAL | Status: DC
  Administered 2015-12-06 – 2015-12-07 (×2): 4 mg via ORAL
  Filled 2015-12-06 (×2): qty 1

## 2015-12-06 MED ORDER — INSULIN ASPART 100 UNIT/ML ~~LOC~~ SOLN: 8.0000 [IU] | Freq: Once | SUBCUTANEOUS | Status: DC

## 2015-12-06 MED ORDER — SODIUM CHLORIDE 0.9 % IV SOLN
3.0000 g | Freq: Once | INTRAVENOUS | Status: DC
  Filled 2015-12-06: qty 3

## 2015-12-06 MED ORDER — WARFARIN - PHYSICIAN DOSING INPATIENT: Freq: Every day | Status: DC

## 2015-12-06 MED ORDER — ATORVASTATIN CALCIUM 20 MG PO TABS
80.0000 mg | ORAL_TABLET | Freq: Every day | ORAL | Status: DC
  Administered 2015-12-07: 18:00:00 80 mg via ORAL
  Filled 2015-12-06: qty 4

## 2015-12-06 MED ORDER — VANCOMYCIN HCL IN DEXTROSE 1-5 GM/200ML-% IV SOLN
INTRAVENOUS | Status: AC
  Administered 2015-12-06: 1000 mg via INTRAVENOUS
  Filled 2015-12-06: qty 200

## 2015-12-06 MED ORDER — CLONIDINE HCL 0.1 MG PO TABS
0.2000 mg | ORAL_TABLET | Freq: Three times a day (TID) | ORAL | Status: DC
  Administered 2015-12-06 – 2015-12-08 (×5): 0.2 mg via ORAL
  Filled 2015-12-06 (×2): qty 1
  Filled 2015-12-06 (×3): qty 2
  Filled 2015-12-06 (×2): qty 1

## 2015-12-06 NOTE — ED Notes (Signed)
Pt with bilateral cheeks swollen, red, itching. Also red bumps upper chest and back. Pt denies airway, tongue  Swelling. No s/s of distress.

## 2015-12-06 NOTE — ED Provider Notes (Signed)
Time Seen: Approximately *1423 I have reviewed the triage notes  Chief Complaint: Allergic Reaction and Rash   History of Present Illness: Juan BlossomWaltzie L Chisholm Jr. is a 45 y.o. male who states he was recently placed on a cholesterol medication along with a new blood pressure medication. Review of his medication show its Carver dialogue and atorvastatin. Patient [facility]. He states over the last 72 hours he has developed a rash on both of his cheeks and is very pruritic in nature with a rash across his upper shoulders and anterior chest region. Patient has a history of insulin-dependent diabetes along with hypertension. He also has a history of a left leg deep venous thrombosis and is currently on Coumadin. He states he had some intermittent nosebleeds over the weekend but states they stopped on her own. He states he gets relief whenever he takes Benadryl last Benadryl that he took was at 8 PM last night. He denies any trouble with shortness of breath, speech. He states he had a low-grade fever last night. At 101.   Past Medical History  Diagnosis Date  . Diabetes mellitus without complication (HCC)     a. Dx ~ 1996.  . Essential hypertension   . CKD (chronic kidney disease), stage III   . GERD (gastroesophageal reflux disease)   . Cellulitis and abscess of foot     Left-Dr. Ether GriffinsFowler  . Osteomyelitis (HCC)     a. 05/2015 L foot.  . Left leg DVT (HCC)     a. Dx 05/2015 -> Coumadin.  . Gastritis     a. 04/2015 hematemesis -> EGD: gastritis, esophagitis, duodenitis.  No active bleeding.  PPI added.  . MI (myocardial infarction) [facility](HCC)     Patient Active Problem List   Diagnosis Date Noted  . AKI (acute kidney injury) (HCC) 11/01/2015  . Hyperkalemia 11/01/2015    Past Surgical History  Procedure Laterality Date  . I&d extremity Left 04/06/2015    Procedure: IRRIGATION AND DEBRIDEMENT EXTREMITY;  Surgeon: Gwyneth RevelsJustin Fowler, DPM;  Location: [facility] ORS;   Service: Podiatry;  Laterality: Left;  . Esophagogastroduodenoscopy N/A 04/07/2015    Procedure: ESOPHAGOGASTRODUODENOSCOPY (EGD);  Surgeon: Midge Miniumarren Wohl, MD;  [facility];  Service: Endoscopy;  Laterality: N/A;  . Foot surgery    . Irrigation and debridement foot Left 05/21/2015    Procedure: IRRIGATION AND DEBRIDEMENT FOOT;  Surgeon: Linus Galasodd Cline, MD;  Location: [facility] ORS;  Service: Podiatry;  Laterality: Left;  . Esophagogastroduodenoscopy (egd) with propofol Left 06/30/2015    Procedure: ESOPHAGOGASTRODUODENOSCOPY (EGD) WITH PROPOFOL;  Surgeon: Christena DeemMartin U Skulskie, MD;  [facility];  Service: Endoscopy;  Laterality: Left;    Past Surgical History  Procedure Laterality Date  . I&d extremity Left 04/06/2015    Procedure: IRRIGATION AND DEBRIDEMENT EXTREMITY;  Surgeon: Gwyneth RevelsJustin Fowler, DPM;  Location: [facility] ORS;  Service: Podiatry;  Laterality: Left;  . Esophagogastroduodenoscopy N/A 04/07/2015    Procedure: ESOPHAGOGASTRODUODENOSCOPY (EGD);  Surgeon: Midge Miniumarren Wohl, MD;  [facility];  Service: Endoscopy;  Laterality: N/A;  . Foot surgery    . Irrigation and debridement foot Left 05/21/2015    Procedure: IRRIGATION AND DEBRIDEMENT FOOT;  Surgeon: Linus Galasodd Cline, MD;  Location: [facility] ORS;  Service: Podiatry;  Laterality: Left;  . Esophagogastroduodenoscopy (egd) with propofol Left 06/30/2015    Procedure: ESOPHAGOGASTRODUODENOSCOPY (EGD) WITH PROPOFOL;  Surgeon: Christena DeemMartin U Skulskie, MD;  [facility];  Service: Endoscopy;  Laterality: Left;    Current Outpatient Rx  Name  Route  Sig  Dispense  Refill  . atorvastatin (LIPITOR) 80 MG tablet   Oral   Take 1 tablet (80 mg total) by mouth daily at 6 PM.   30 tablet   0   . cloNIDine (CATAPRES) 0.2 MG tablet   Oral   Take 1 tablet (0.2 mg total) by mouth 3 (three) times daily.      4   . furosemide (LASIX) 40 MG tablet   Oral   Take 1 tablet (40 mg total) by mouth 2 (two) times daily.   60 tablet   0   .  insulin aspart (NOVOLOG) 100 UNIT/ML injection   Subcutaneous   Inject 0-9 Units into the skin 3 (three) times daily with meals. Patient not taking: Reported on 06/28/2015   10 mL   11   . metoCLOPramide (REGLAN) 5 MG tablet   Oral   Take 1 tablet (5 mg total) by mouth every 6 (six) hours as needed for nausea or vomiting.   30 tablet   0   . metoprolol (LOPRESSOR) 50 MG tablet   Oral   Take 1 tablet (50 mg total) by mouth 2 (two) times daily.   60 tablet   0   . pantoprazole (PROTONIX) 40 MG tablet   Oral   Take 1 tablet (40 mg total) by mouth daily.   30 tablet   0   . TRESIBA FLEXTOUCH 200 UNIT/ML SOPN   Subcutaneous   Inject 25 Units into the skin daily.       5     Dispense as written.     Allergies:  Sulfur  Family History: Family History  Problem Relation Age of Onset  . Coronary artery disease Father   . Pancreatic cancer Mother   . Breast cancer Sister   . Lung cancer Brother   . Pancreatic cancer Mother     Social History: Social History  Substance Use Topics  . Smoking status: Never Smoker   . Smokeless tobacco: Not on file  . Alcohol Use: No     Review of Systems:   10 point review of systems was performed and was otherwise negative:  Constitutional: Fever last night but none today Eyes: No visual disturbances ENT: No sore throat, ear pain Cardiac: No chest pain Respiratory: No shortness of breath, wheezing, or stridor Abdomen: No abdominal pain, no vomiting, No diarrhea Endocrine: No weight loss, No night sweats Extremities: No new peripheral edema, cyanosis Skin: No rashes, easy bruising Neurologic: No focal weakness, trouble with speech or swollowing Urologic: No dysuria, Hematuria, or urinary frequency No trouble with speech or swallowing  Physical Exam:  ED Triage Vitals  Enc Vitals Group     BP 12/06/15 1138 207/72 mmHg     Pulse Rate 12/06/15 1138 103     Resp 12/06/15 1138 18     Temp 12/06/15 1138 98.9 F (37.2 C)      Temp src --      SpO2 12/06/15 1138 99 %     Weight --      Height --      Head Cir --      Peak Flow --      Pain Score --      Pain Loc --      Pain Edu? --      Excl. in GC? --     General: Awake , Alert , and Oriented times 3; GCS 15 Head: Normal cephalic ,  atraumatic Eyes: Pupils equal , round, reactive to light Nose/Throat: No nasal drainage, patent upper airway without erythema or exudate.  Neck: Supple, Full range of motion, No anterior adenopathy or palpable thyroid masses Lungs: Clear to ascultation without wheezes , rhonchi, or rales Heart: Regular rate, regular rhythm without murmurs , gallops , or rubs Abdomen: Soft, non tender without rebound, guarding , or rigidity; bowel sounds positive and symmetric in all 4 quadrants. No organomegaly .        Extremities: Mild circumferential bilateral nonpitting edema Neurologic: normal ambulation, Motor symmetric without deficits, sensory intact Skin: Face shows fairly confluent masklike rash that's on both sides of his face. He also has some very punctate areas in the upper back and anterior chest. Rash is warm to the touch. Some areas of urticaria below the eyes. There does not appear to be any ocular involvement   Labs:   All laboratory work was reviewed including any pertinent negatives or positives listed below:  Labs Reviewed  RAPID INFLUENZA A&B ANTIGENS ([facility] ONLY)  BASIC METABOLIC PANEL  CBC WITH DIFFERENTIAL/PLATELET  PROTIME-INR   patient's laboratory work except for his blood sugar appears to be within normal limits for the patient.   ED Course: * The patient received Benadryl and Solu-Medrol here with a test to see if his symptoms improved. The rash appears consistent patient still feels that it's pruritic in nature. The concern is this may be a skin cellulitis especially given its confluence in  nature, elevated blood sugar, low grade fever. This may be staphylococcal or streptococcal type skin infection.  Patient was started on IV vancomycin The patient's blood sugar was elevated prior to receiving steroids and he did take his normal dosage today in fact his had little to eat today which is also concerning this may be more of an infectious source.   Assessment: *Facial cellulitis      Plan: Inpatient management. I spoke to the hospitalist team, they've agreed to see and evaluate the patient, further disposition and management depends upon her evaluation            Jennye Moccasin, MD 12/06/15 1718

## 2015-12-06 NOTE — H&P (Signed)
[facility] - [place] at [facility]   PATIENT NAME: Juan Roberson    MR#:  [id number]  DATE OF BIRTH:  27-Jul-1971  DATE OF ADMISSION:  12/06/2015  PRIMARY CARE PHYSICIAN: Corky Downs, MD   REQUESTING/REFERRING PHYSICIAN: McShane  CHIEF COMPLAINT:   Chief Complaint  Patient presents with  . Allergic Reaction  . Rash    HISTORY OF PRESENT ILLNESS: Juan Roberson  is a 45 y.o. male with a known history of diabetes, essential hypertension, chronic kidney disease, cellulitis and abscess of the foot, osteoarthritis, left leg DVT on Coumadin currently, myocardial infarction- said that his primary care physician recently started on carvedilol and atorvastatin a few weeks ago.  For last 3-4 days he started having swelling and redness on his face with some small papules on his back also and this is itching, he also have some chills and fever. He spoke to his primary care physician he suggested maybe he has allergy to his new medication is started and told him to take Benadryl, but he did not get relief so came to emergency room. Started on antibiotic by ER physician and given for admission.  PAST MEDICAL HISTORY:   Past Medical History  Diagnosis Date  . Diabetes mellitus without complication (HCC)     a. Dx ~ 1996.  . Essential hypertension   . CKD (chronic kidney disease), stage III   . GERD (gastroesophageal reflux disease)   . Cellulitis and abscess of foot     Left-Dr. Ether Griffins  . Osteomyelitis (HCC)     a. 05/2015 L foot.  . Left leg DVT (HCC)     a. Dx 05/2015 -> Coumadin.  . Gastritis     a. 04/2015 hematemesis -> EGD: gastritis, esophagitis, duodenitis.  No active bleeding.  PPI added.  . MI (myocardial infarction) (HCC)     PAST SURGICAL HISTORY: Past Surgical History  Procedure Laterality Date  . I&d extremity Left 04/06/2015    Procedure: IRRIGATION AND DEBRIDEMENT EXTREMITY;  Surgeon: Gwyneth Revels, DPM;  Location: [facility] ORS;  Service: Podiatry;   Laterality: Left;  . Esophagogastroduodenoscopy N/A 04/07/2015    Procedure: ESOPHAGOGASTRODUODENOSCOPY (EGD);  Surgeon: Midge Minium, MD;  [facility];  Service: Endoscopy;  Laterality: N/A;  . Foot surgery    . Irrigation and debridement foot Left 05/21/2015    Procedure: IRRIGATION AND DEBRIDEMENT FOOT;  Surgeon: Linus Galas, MD;  Location: [facility] ORS;  Service: Podiatry;  Laterality: Left;  . Esophagogastroduodenoscopy (egd) with propofol Left 06/30/2015    Procedure: ESOPHAGOGASTRODUODENOSCOPY (EGD) WITH PROPOFOL;  Surgeon: Christena Deem, MD;  Location: [facility] ENDOSCOPY;  Service: Endoscopy;  Laterality: Left;    SOCIAL HISTORY:  Social History  Substance Use Topics  . Smoking status: Never Smoker   . Smokeless tobacco: Not on file  . Alcohol Use: No    FAMILY HISTORY:  Family History  Problem Relation Age of Onset  . Coronary artery disease Father   . Pancreatic cancer Mother   . Breast cancer Sister   . Lung cancer Brother   . Pancreatic cancer Mother     DRUG ALLERGIES:  Allergies  Allergen Reactions  . Sulfur Other (See Comments)    Pt states that this medication causes renal failure.      REVIEW OF SYSTEMS:   CONSTITUTIONAL: Positive for fever, fatigue or weakness.  EYES: No blurred or double vision.  EARS, NOSE, AND THROAT: No tinnitus or ear pain.  RESPIRATORY: No cough, shortness of breath, wheezing or hemoptysis.  CARDIOVASCULAR: No chest pain, orthopnea, edema.  GASTROINTESTINAL: No nausea, vomiting, diarrhea or abdominal pain.  GENITOURINARY: No dysuria, hematuria.  ENDOCRINE: No polyuria, nocturia,  HEMATOLOGY: No anemia, easy bruising or bleeding SKIN: Positive for rash or lesion. MUSCULOSKELETAL: No joint pain or arthritis.   NEUROLOGIC: No tingling, numbness, weakness.  PSYCHIATRY: No anxiety or depression.   MEDICATIONS AT HOME:  Prior to Admission medications   Medication Sig Start Date End Date Taking? Authorizing Provider   atorvastatin (LIPITOR) 80 MG tablet Take 1 tablet (80 mg total) by mouth daily at 6 PM. 11/03/15  Yes Srikar Sudini, MD  carvedilol (COREG) 6.25 MG tablet Take 1 tablet by mouth 2 (two) times daily. 11/19/15  Yes Historical Provider, MD  cloNIDine (CATAPRES) 0.2 MG tablet Take 1 tablet (0.2 mg total) by mouth 3 (three) times daily. 11/03/15  Yes Srikar Sudini, MD  furosemide (LASIX) 40 MG tablet Take 1 tablet (40 mg total) by mouth 2 (two) times daily. 11/03/15  Yes Srikar Sudini, MD  losartan (COZAAR) 25 MG tablet Take 25 mg by mouth daily. 11/22/15  Yes Historical Provider, MD  metoCLOPramide (REGLAN) 5 MG tablet Take 1 tablet (5 mg total) by mouth every 6 (six) hours as needed for nausea or vomiting. 11/03/15  Yes Srikar Sudini, MD  metoprolol (LOPRESSOR) 50 MG tablet Take 1 tablet (50 mg total) by mouth 2 (two) times daily. 07/01/15  Yes Sital Mody, MD  pantoprazole (PROTONIX) 40 MG tablet Take 1 tablet (40 mg total) by mouth daily. 11/03/15  Yes Srikar Sudini, MD  TRESIBA FLEXTOUCH 200 UNIT/ML SOPN Inject 25 Units into the skin daily.    Yes Historical Provider, MD  warfarin (COUMADIN) 4 MG tablet Take 4 mg by mouth daily. 11/19/15  Yes Historical Provider, MD  insulin aspart (NOVOLOG) 100 UNIT/ML injection Inject 0-9 Units into the skin 3 (three) times daily with meals. Patient not taking: Reported on 06/28/2015 05/24/15   Katharina Caper, MD      PHYSICAL EXAMINATION:   VITAL SIGNS: Blood pressure 186/96, pulse 101, temperature 99.4 F (37.4 C), temperature source Oral, resp. rate 18, SpO2 100 %.  GENERAL:  45 y.o.-year-old patient lying in the bed with no acute distress.  EYES: Pupils equal, round, reactive to light and accommodation. No scleral icterus. Extraocular muscles intact.  HEENT: Head atraumatic, normocephalic. Oropharynx and nasopharynx clear.  NECK:  Supple, no jugular venous distention. No thyroid enlargement, no tenderness.  LUNGS: Normal breath sounds bilaterally, no wheezing,  rales,rhonchi or crepitation. No use of accessory muscles of respiration.  CARDIOVASCULAR: S1, S2 normal. No murmurs, rubs, or gallops.  ABDOMEN: Soft, nontender, nondistended. Bowel sounds present. No organomegaly or mass.  EXTREMITIES: No pedal edema, cyanosis, or clubbing.  NEUROLOGIC: Cranial nerves II through XII are intact. Muscle strength 5/5 in all extremities. Sensation intact. Gait not checked.  PSYCHIATRIC: The patient is alert and oriented x 3.  SKIN: On face on both cheeks there is redness, on his upper chest and back there are some small 2-3 mm size papules present.  LABORATORY PANEL:   CBC  Recent Labs Lab 12/06/15 1451  WBC 10.3  HGB 9.7*  HCT 30.2*  PLT 133*  MCV 78.4*  MCH 25.1*  MCHC 32.0  RDW 16.9*  LYMPHSABS 1.4  MONOABS 0.8  EOSABS 0.0  BASOSABS 0.0   ------------------------------------------------------------------------------------------------------------------  Chemistries   Recent Labs Lab 12/06/15 1451  NA 133*  K 3.8  CL 100*  CO2 24  GLUCOSE 498*  BUN 42*  CREATININE 2.24*  CALCIUM 8.6*   ------------------------------------------------------------------------------------------------------------------ CrCl cannot be calculated (Unknown ideal weight.). ------------------------------------------------------------------------------------------------------------------ No results for input(s): TSH, T4TOTAL, T3FREE, THYROIDAB in the last 72 hours.  Invalid input(s): FREET3   Coagulation profile  Recent Labs Lab 12/06/15 1451  INR 2.58   ------------------------------------------------------------------------------------------------------------------- No results for input(s): DDIMER in the last 72 hours. -------------------------------------------------------------------------------------------------------------------  Cardiac Enzymes No results for input(s): CKMB, TROPONINI, MYOGLOBIN in the last 168 hours.  Invalid input(s):  CK ------------------------------------------------------------------------------------------------------------------ Invalid input(s): POCBNP  ---------------------------------------------------------------------------------------------------------------  Urinalysis    Component Value Date/Time   COLORURINE Straw 07/26/2014 1920   APPEARANCEUR Clear 07/26/2014 1920   LABSPEC 1.012 07/26/2014 1920   PHURINE 5.0 07/26/2014 1920   GLUCOSEU Negative 07/26/2014 1920   HGBUR Negative 07/26/2014 1920   BILIRUBINUR Negative 07/26/2014 1920   KETONESUR Negative 07/26/2014 1920   PROTEINUR 100 mg/dL 04/54/098110/25/2015 19141920   NITRITE Negative 07/26/2014 1920   LEUKOCYTESUR Negative 07/26/2014 1920     RADIOLOGY: No results found.  EKG: Orders placed or performed during the hospital encounter of 11/01/15  . EKG 12-Lead  . EKG 12-Lead  . ED EKG within 10 minutes  . ED EKG within 10 minutes  . EKG    IMPRESSION AND PLAN:  * Cellulitis on face  We'll give IV Unasyn for now.  He had fever and chills with this redness and swelling straight 0.2 words cellulitis more.  He also have those papules on his chest and back which is not very well explained.  Give Benadryl if needed.  * Uncontrolled hypertension  He said he stopped taking his medication because of concern of having a medication reaction  I will resume all the medications and continue monitoring.  * Diabetes  Keep on insulin sliding scale coverage.  * History of DVT  He is on Coumadin and INR is therapeutic, continue the same.  * Hyperlipidemia  Continue statin.  * Chronic kidney disease  Appears stable.  * Chronic CHF  Stable, no signs of exacerbation.   All the records are reviewed and case discussed with ED provider. Management plans discussed with the patient, family and they are in agreement.  CODE STATUS: Full code Code Status History    Date Active Date Inactive Code Status Order ID Comments User Context    11/01/2015  4:00 PM 11/03/2015  2:28 PM Full Code [id number]  Wyatt Hasteavid K Hower, MD ED   06/29/2015  2:30 AM 07/01/2015  2:21 PM Full Code [id number]  Arnaldo NatalMichael S Diamond, MD Inpatient   05/21/2015  4:34 PM 05/25/2015  4:34 PM Full Code [id number]  Linus Galasodd Cline, MD Inpatient   05/18/2015  9:17 PM 05/21/2015  4:34 PM Full Code [id number]  Shaune PollackQing Chen, MD Inpatient   04/05/2015  7:05 AM 04/08/2015  2:00 PM Full Code [id number]  Arnaldo NatalMichael S Diamond, MD Inpatient       TOTAL TIME TAKING CARE OF THIS PATIENT: 50 minutes.    Altamese DillingVACHHANI, Leslea Vowles M.D on 12/06/2015   Between 7am to 6pm - Pager - [phone]  After 6pm go to [email or web address] - password [email or web address]  [facility]  [facility]ists  Office  [phone]  CC: Primary care physician; Corky DownsMASOUD,JAVED, MD   Note: This dictation was prepared with Dragon dictation along with smaller phrase technology. Any transcriptional errors that result from this process are unintentional.

## 2015-12-06 NOTE — ED Notes (Signed)
Pt reports starting 2 new medications recently. Pt reports facial redness and swelling. Pt stopped taking the medications on on Friday. Pt reports taking benadryl last night a 8 pm. Pt has a red rash around his eyes and bridge of his nose.

## 2015-12-06 NOTE — Progress Notes (Signed)
ANTIBIOTIC CONSULT NOTE - INITIAL  Pharmacy Consult for Unasyn Indication: cellulitis  Allergies  Allergen Reactions  . Sulfur Other (See Comments)    Pt states that this medication causes renal failure.      Patient Measurements:    Vital Signs: Temp: 99.4 F (37.4 C) (03/06 1616) Temp Source: Oral (03/06 1616) BP: 186/96 mmHg (03/06 1616) Pulse Rate: 101 (03/06 1616) Intake/Output from previous day:   Intake/Output from this shift: Total I/O In: -  Out: 200 [Urine:200]  Labs:  Recent Labs  12/06/15 1451  WBC 10.3  HGB 9.7*  PLT 133*  CREATININE 2.24*   CrCl cannot be calculated (Unknown ideal weight.). No results for input(s): VANCOTROUGH, VANCOPEAK, VANCORANDOM, GENTTROUGH, GENTPEAK, GENTRANDOM, TOBRATROUGH, TOBRAPEAK, TOBRARND, AMIKACINPEAK, AMIKACINTROU, AMIKACIN in the last 72 hours.   Microbiology: Recent Results (from the past 720 hour(s))  Rapid Influenza A&B Antigens ([facility] only)     Status: None   Collection Time: 12/06/15  2:51 PM  Result Value Ref Range Status   Influenza A ([facility]) NEGATIVE NEGATIVE Final   Influenza B [facility]) NEGATIVE NEGATIVE Final    Medical History: Past Medical History  Diagnosis Date  . Diabetes mellitus without complication (HCC)     a. Dx ~ 1996.  . Essential hypertension   . CKD (chronic kidney disease), stage III   . GERD (gastroesophageal reflux disease)   . Cellulitis and abscess of foot     Left-Dr. Ether GriffinsFowler  . Osteomyelitis (HCC)     a. 05/2015 L foot.  . Left leg DVT (HCC)     a. Dx 05/2015 -> Coumadin.  . Gastritis     a. 04/2015 hematemesis -> EGD: gastritis, esophagitis, duodenitis.  No active bleeding.  PPI added.  . MI (myocardial infarction) (HCC)     Medications:  Scheduled:  . [START ON 12/07/2015] insulin aspart  0-9 Units Subcutaneous TID WC   Infusions:  . ampicillin-sulbactam (UNASYN) IV     Assessment: Unasyn ordered for this 45 yo male with cellulitis on his face, chest, and back.    Goal of  Therapy:  Resolution of infection  Plan:  Will order Unasyn 3g IV Q6H.  Follow up culture results  Stormy CardKatsoudas,Lyanna Blystone K, RPh Clinical Pharmacist 12/06/2015,5:58 PM

## 2015-12-06 NOTE — ED Notes (Signed)
Pt given ED meal box and diet sprite.

## 2015-12-07 LAB — BASIC METABOLIC PANEL
Anion gap: 9 (ref 5–15)
BUN: 49 mg/dL — AB (ref 6–20)
CO2: 24 mmol/L (ref 22–32)
CREATININE: 2.4 mg/dL — AB (ref 0.61–1.24)
Calcium: 8.7 mg/dL — ABNORMAL LOW (ref 8.9–10.3)
Chloride: 103 mmol/L (ref 101–111)
GFR, EST AFRICAN AMERICAN: 36 mL/min — AB (ref >60)
GFR, EST NON AFRICAN AMERICAN: 31 mL/min — AB (ref >60)
Glucose, Bld: 392 mg/dL — ABNORMAL HIGH (ref 65–99)
Potassium: 3.7 mmol/L (ref 3.5–5.1)
SODIUM: 136 mmol/L (ref 135–145)

## 2015-12-07 LAB — CBC
HCT: 29.7 % — ABNORMAL LOW (ref 40.0–52.0)
Hemoglobin: 9.6 g/dL — ABNORMAL LOW (ref 13.0–18.0)
MCH: 25.1 pg — ABNORMAL LOW (ref 26.0–34.0)
MCHC: 32.4 g/dL (ref 32.0–36.0)
MCV: 77.4 fL — ABNORMAL LOW (ref 80.0–100.0)
PLATELETS: 130 10*3/uL — AB (ref 150–440)
RBC: 3.84 MIL/uL — AB (ref 4.40–5.90)
RDW: 16.5 % — AB (ref 11.5–14.5)
WBC: 10.2 10*3/uL (ref 3.8–10.6)

## 2015-12-07 LAB — GLUCOSE, CAPILLARY
GLUCOSE-CAPILLARY: 321 mg/dL — AB (ref 65–99)
GLUCOSE-CAPILLARY: 487 mg/dL — AB (ref 65–99)
GLUCOSE-CAPILLARY: 523 mg/dL — AB (ref 65–99)
Glucose-Capillary: 452 mg/dL — ABNORMAL HIGH (ref 65–99)
Glucose-Capillary: 269 mg/dL — ABNORMAL HIGH (ref 65–99)
Glucose-Capillary: 293 mg/dL — ABNORMAL HIGH (ref 65–99)
Glucose-Capillary: 333 mg/dL — ABNORMAL HIGH (ref 65–99)

## 2015-12-07 LAB — PROTIME-INR
INR: 1.9
PROTHROMBIN TIME: 21.7 s — AB (ref 11.4–15.0)

## 2015-12-07 LAB — HEMOGLOBIN A1C: HEMOGLOBIN A1C: 9.8 % — AB (ref 4.0–6.0)

## 2015-12-07 MED ORDER — CARVEDILOL 12.5 MG PO TABS
12.5000 mg | ORAL_TABLET | Freq: Two times a day (BID) | ORAL | Status: DC
  Administered 2015-12-07 – 2015-12-08 (×2): 12.5 mg via ORAL
  Filled 2015-12-07 (×2): qty 1

## 2015-12-07 MED ORDER — INSULIN GLARGINE 100 UNIT/ML ~~LOC~~ SOLN
15.0000 [IU] | Freq: Once | SUBCUTANEOUS | Status: AC
  Administered 2015-12-07: 05:00:00 15 [IU] via SUBCUTANEOUS
  Filled 2015-12-07: qty 0.15

## 2015-12-07 MED ORDER — INSULIN ASPART 100 UNIT/ML ~~LOC~~ SOLN
8.0000 [IU] | Freq: Once | SUBCUTANEOUS | Status: AC
Start: 2015-12-07 — End: 2015-12-07
  Administered 2015-12-07: 8 [IU] via SUBCUTANEOUS
  Filled 2015-12-07: qty 8

## 2015-12-07 MED ORDER — ALUM & MAG HYDROXIDE-SIMETH 200-200-20 MG/5ML PO SUSP
30.0000 mL | Freq: Four times a day (QID) | ORAL | Status: DC | PRN
  Administered 2015-12-07: 13:00:00 30 mL via ORAL
  Filled 2015-12-07: qty 30

## 2015-12-07 MED ORDER — WARFARIN SODIUM 4 MG PO TABS: 4.0000 mg | ORAL_TABLET | Freq: Every day | ORAL | Status: DC

## 2015-12-07 MED ORDER — INSULIN GLARGINE 100 UNIT/ML ~~LOC~~ SOLN
30.0000 [IU] | Freq: Every day | SUBCUTANEOUS | Status: DC
  Administered 2015-12-07: 30 [IU] via SUBCUTANEOUS
  Filled 2015-12-07 (×2): qty 0.3

## 2015-12-07 MED ORDER — INSULIN ASPART 100 UNIT/ML ~~LOC~~ SOLN
0.0000 [IU] | Freq: Three times a day (TID) | SUBCUTANEOUS | Status: DC
Start: 2015-12-07 — End: 2015-12-08
  Administered 2015-12-07 (×2): 8 [IU] via SUBCUTANEOUS
  Administered 2015-12-07 – 2015-12-08 (×2): 11 [IU] via SUBCUTANEOUS
  Filled 2015-12-07: qty 1
  Filled 2015-12-07: qty 8
  Filled 2015-12-07: qty 11
  Filled 2015-12-07: qty 8

## 2015-12-07 MED ORDER — ACETAMINOPHEN 325 MG PO TABS
650.0000 mg | ORAL_TABLET | Freq: Four times a day (QID) | ORAL | Status: DC | PRN
Start: 2015-12-07 — End: 2015-12-08
  Administered 2015-12-07: 650 mg via ORAL
  Filled 2015-12-07: qty 2

## 2015-12-07 MED ORDER — INSULIN ASPART 100 UNIT/ML ~~LOC~~ SOLN
0.0000 [IU] | Freq: Every day | SUBCUTANEOUS | Status: DC
  Administered 2015-12-07: 22:00:00 4 [IU] via SUBCUTANEOUS
  Filled 2015-12-07: qty 4

## 2015-12-07 MED ORDER — WARFARIN SODIUM 10 MG PO TABS
5.0000 mg | ORAL_TABLET | Freq: Once | ORAL | Status: AC
  Administered 2015-12-07: 18:00:00 5 mg via ORAL
  Filled 2015-12-07: qty 1

## 2015-12-07 MED ORDER — INSULIN GLARGINE 100 UNIT/ML ~~LOC~~ SOLN: 25.0000 [IU] | Freq: Every day | SUBCUTANEOUS | Status: DC

## 2015-12-07 MED ORDER — INSULIN ASPART 100 UNIT/ML ~~LOC~~ SOLN
6.0000 [IU] | Freq: Once | SUBCUTANEOUS | Status: AC
  Administered 2015-12-07: 02:00:00 6 [IU] via SUBCUTANEOUS
  Filled 2015-12-07: qty 6

## 2015-12-07 NOTE — Plan of Care (Signed)
Problem: Skin Integrity: Goal: Skin integrity will improve Outcome: Progressing Pt with dx of cellulitis of the face. Face still warm and red. Pt on IV Ampicillin. Afebrile this shift. Am labs pending.  CBG's have been elevated. Spoke with on call physicians and obtained orders in relation to hyperglycemic episodes on numerous occasions. MD said to wait on am labs results to see if insulin coverage was sufficient. Next scheduled CBG ac breakfast.

## 2015-12-07 NOTE — Progress Notes (Signed)
Initial Nutrition Assessment   INTERVENTION:   Meals and Snacks: Cater to patient preferences Medical Food Supplement Therapy: will send Sugar Free Carnation Instant Breakfast BID for added nutrition   NUTRITION DIAGNOSIS:   Inadequate oral intake related to poor appetite as evidenced by per patient/family report.  GOAL:   Patient will meet greater than or equal to 90% of their needs; ongoing  MONITOR:   PO intake, Supplement acceptance, Labs, Weight trends  REASON FOR ASSESSMENT:   Malnutrition Screening Tool    ASSESSMENT:   Pt admitted with cellulitis of the face. Pt on isolation for MRSA.  Past Medical History  Diagnosis Date  . Diabetes mellitus without complication (HCC)     a. Dx ~ 1996.  . Essential hypertension   . CKD (chronic kidney disease), stage III   . GERD (gastroesophageal reflux disease)   . Cellulitis and abscess of foot     Left-Dr. Ether Griffins  . Osteomyelitis (HCC)     a. 05/2015 L foot.  . Left leg DVT (HCC)     a. Dx 05/2015 -> Coumadin.  . Gastritis     a. 04/2015 hematemesis -> EGD: gastritis, esophagitis, duodenitis.  No active bleeding.  PPI added.  . MI (myocardial infarction) (HCC)      Diet Order:  Diet heart healthy/carb modified Room service appropriate?: Yes; Fluid consistency:: Thin    Current Nutrition: Pt reports eating well this am with a very big appetite wanting to eat more on visit.   Food/Nutrition-Related History: Pt reports poor po intake for just a few days PTA, otherwise reports a good appetite previously.   Scheduled Medications:  . ampicillin-sulbactam (UNASYN) IV  3 g Intravenous Q6H  . atorvastatin  80 mg Oral q1800  . carvedilol  12.5 mg Oral BID  . Chlorhexidine Gluconate Cloth  6 each Topical Q0600  . cloNIDine  0.2 mg Oral TID  . furosemide  40 mg Oral BID  . insulin aspart  0-15 Units Subcutaneous TID WC  . insulin aspart  0-5 Units Subcutaneous QHS  . insulin glargine  30 Units Subcutaneous QHS  .  losartan  25 mg Oral Daily  . mupirocin ointment  1 application Nasal BID  . pantoprazole  40 mg Oral Daily  . warfarin  4 mg Oral Daily  . Warfarin - Physician Dosing Inpatient   Does not apply q1800    Electrolyte/Renal Profile and Glucose Profile:   Recent Labs Lab 12/06/15 1451 12/07/15 0537  NA 133* 136  K 3.8 3.7  CL 100* 103  CO2 24 24  BUN 42* 49*  CREATININE 2.24* 2.40*  CALCIUM 8.6* 8.7*  GLUCOSE 498* 392*   Protein Profile: No results for input(s): ALBUMIN in the last 168 hours.  Gastrointestinal Profile: Last BM:  12/06/2015   Nutrition-Focused Physical Exam Findings: Nutrition-Focused physical exam completed. Findings are WDL for fat depletion, muscle depletion, and edema.    Weight Change: Pt reports weight of 263lbs June 2016. Pt reports having a work injury at the time and has been progressively losing weight since. Current weight 175lbs.   Height:   Ht Readings from Last 1 Encounters:  12/06/15  (1.88 m)    Weight:   Wt Readings from Last 1 Encounters:  12/06/15 175 lb 9.6 oz (79.652 kg)   Wt Readings from Last 10 Encounters:  12/06/15 175 lb 9.6 oz (79.652 kg)  11/03/15 227 lb (102.967 kg)  07/01/15 196 lb 4.8 oz (89.041 kg)  05/25/15 208 lb  3.2 oz (94.439 kg)  05/18/15 200 lb (90.719 kg)  04/07/15 215 lb 6.4 oz (97.705 kg)  04/02/15 220 lb (99.791 kg)    BMI:  Body mass index is 22.54 kg/(m^2).  Estimated Nutritional Needs:   Kcal:  BEE: 1745kcals, TEE: (IF1.0-1.2)(AF 1.3) [id number]  Protein:  80-96g protein (1.0-1.2g/kg)  Fluid:  2-2.4L fluid (25-9030mL/kg)  EDUCATION NEEDS:   No education needs identified at this time   MODERATE Care Level  Leda QuailAllyson Xzaviar Maloof, RD, LDN Pager [phone] Weekend/On-Call Pager [phone]

## 2015-12-07 NOTE — Progress Notes (Signed)
ANTICOAGULATION CONSULT NOTE - Initial Consult  Pharmacy Consult for warfarin  Indication: history of  DVT  Allergies  Allergen Reactions  . Sulfur Other (See Comments)    Pt states that this medication causes renal failure.      Patient Measurements: Height:  (188 cm) Weight: 175 lb 9.6 oz (79.652 kg) IBW/kg (Calculated) : 82.2 Heparin Dosing Weight:   Vital Signs:    Labs:  Recent Labs  12/06/15 1451 12/07/15 0537  HGB 9.7* 9.6*  HCT 30.2* 29.7*  PLT 133* 130*  LABPROT 27.3* 21.7*  INR 2.58 1.90  CREATININE 2.24* 2.40*    Estimated Creatinine Clearance: 43.4 mL/min (by C-G formula based on Cr of 2.4).   Medical History: Past Medical History  Diagnosis Date  . Diabetes mellitus without complication (HCC)     a. Dx ~ 1996.  . Essential hypertension   . CKD (chronic kidney disease), stage III   . GERD (gastroesophageal reflux disease)   . Cellulitis and abscess of foot     Left-Dr. Ether Griffins  . Osteomyelitis (HCC)     a. 05/2015 L foot.  . Left leg DVT (HCC)     a. Dx 05/2015 -> Coumadin.  . Gastritis     a. 04/2015 hematemesis -> EGD: gastritis, esophagitis, duodenitis.  No active bleeding.  PPI added.  . MI (myocardial infarction) (HCC)     Medications:  Prescriptions prior to admission  Medication Sig Dispense Refill Last Dose  . atorvastatin (LIPITOR) 80 MG tablet Take 1 tablet (80 mg total) by mouth daily at 6 PM. 30 tablet 0 12/03/2015  . carvedilol (COREG) 6.25 MG tablet Take 1 tablet by mouth 2 (two) times daily.  6 12/03/2015  . cloNIDine (CATAPRES) 0.2 MG tablet Take 1 tablet (0.2 mg total) by mouth 3 (three) times daily.  4 12/03/2015  . furosemide (LASIX) 40 MG tablet Take 1 tablet (40 mg total) by mouth 2 (two) times daily. 60 tablet 0 12/03/2015  . losartan (COZAAR) 25 MG tablet Take 25 mg by mouth daily.  6 12/03/2015  . metoCLOPramide (REGLAN) 5 MG tablet Take 1 tablet (5 mg total) by mouth every 6 (six) hours as needed for nausea or vomiting. 30  tablet 0 prn  . metoprolol (LOPRESSOR) 50 MG tablet Take 1 tablet (50 mg total) by mouth 2 (two) times daily. 60 tablet 0 12/03/2015  . pantoprazole (PROTONIX) 40 MG tablet Take 1 tablet (40 mg total) by mouth daily. 30 tablet 0 12/03/2015  . TRESIBA FLEXTOUCH 200 UNIT/ML SOPN Inject 25 Units into the skin daily.   5 12/03/2015  . warfarin (COUMADIN) 4 MG tablet Take 4 mg by mouth daily.  6 12/03/2015 at 0600  . insulin aspart (NOVOLOG) 100 UNIT/ML injection Inject 0-9 Units into the skin 3 (three) times daily with meals. (Patient not taking: Reported on 06/28/2015) 10 mL 11 Not Taking at Unknown time   Scheduled:  . ampicillin-sulbactam (UNASYN) IV  3 g Intravenous Q6H  . atorvastatin  80 mg Oral q1800  . carvedilol  12.5 mg Oral BID  . Chlorhexidine Gluconate Cloth  6 each Topical Q0600  . cloNIDine  0.2 mg Oral TID  . furosemide  40 mg Oral BID  . insulin aspart  0-15 Units Subcutaneous TID WC  . insulin aspart  0-5 Units Subcutaneous QHS  . insulin glargine  30 Units Subcutaneous QHS  . losartan  25 mg Oral Daily  . mupirocin ointment  1 application Nasal BID  .  pantoprazole  40 mg Oral Daily  . [START ON 12/08/2015] warfarin  4 mg Oral q1800  . warfarin  5 mg Oral ONCE-1800    Assessment: Pharmacy consulted to dose and monitor warfarin therapy for this 45 year old patient with a history of DVT  Home dose: 4 mg PO daily   3/6: INR: 2.58; 4mg   3/7: PT/INR: 1.9   Potential DDI: Unasyn   Goal of Therapy:  INR 2-3 Monitor platelets by anticoagulation protocol: Yes   Plan:  Will give warfarin 5 mg PO x 1 today and then will restart warfarin 4 mg PO daily on 3/8. PT/INR with am labs. Pharmacy to follow.   Aidynn Krenn D 12/07/2015,5:22 PM

## 2015-12-07 NOTE — Progress Notes (Addendum)
[facility] [place] [facility]   PATIENT NAME: Juan Roberson    MR#:  [id number]  DATE OF BIRTH:  08/22/1971  SUBJECTIVE:  CHIEF COMPLAINT:   Chief Complaint  Patient presents with  . Allergic Reaction  . Rash   Better face cellulitis. BS [phone] this am. REVIEW OF SYSTEMS:  CONSTITUTIONAL: No fever, fatigue or weakness.  EYES: No blurred or double vision.  EARS, NOSE, AND THROAT: No tinnitus or ear pain. face cellulitis. RESPIRATORY: No cough, shortness of breath, wheezing or hemoptysis.  CARDIOVASCULAR: No chest pain, orthopnea, edema.  GASTROINTESTINAL: No nausea, vomiting, diarrhea or abdominal pain.  GENITOURINARY: No dysuria, hematuria.  ENDOCRINE: No polyuria, nocturia,  HEMATOLOGY: No anemia, easy bruising or bleeding SKIN: No rash or lesion. MUSCULOSKELETAL: No joint pain or arthritis.   NEUROLOGIC: No tingling, numbness, weakness.  PSYCHIATRY: No anxiety or depression.   DRUG ALLERGIES:   Allergies  Allergen Reactions  . Sulfur Other (See Comments)    Pt states that this medication causes renal failure.      VITALS:  Blood pressure 154/76, pulse 83, temperature 97.7 F (36.5 C), temperature source Oral, resp. rate 18, height 6\' 2"  (1.88 m), weight 79.652 kg (175 lb 9.6 oz), SpO2 97 %.  PHYSICAL EXAMINATION:  GENERAL:  45 y.o.-year-old patient lying in the bed with no acute distress.  EYES: Pupils equal, round, reactive to light and accommodation. No scleral icterus. Extraocular muscles intact.  HEENT: Head atraumatic, normocephalic. Oropharynx and nasopharynx clear. Erythema and swelling on face (both side), no tenderness. NECK:  Supple, no jugular venous distention. No thyroid enlargement, no tenderness.  LUNGS: Normal breath sounds bilaterally, no wheezing, rales,rhonchi or crepitation. No use of accessory muscles of respiration.  CARDIOVASCULAR: S1, S2 normal. No murmurs, rubs, or gallops.  ABDOMEN: Soft, nontender,  nondistended. Bowel sounds present. No organomegaly or mass.  EXTREMITIES: No pedal edema, cyanosis, or clubbing.  NEUROLOGIC: Cranial nerves II through XII are intact. Muscle strength 5/5 in all extremities. Sensation intact. Gait not checked.  PSYCHIATRIC: The patient is alert and oriented x 3.  SKIN: No obvious rash, lesion, or ulcer.    LABORATORY PANEL:   CBC  Recent Labs Lab 12/07/15 0537  WBC 10.2  HGB 9.6*  HCT 29.7*  PLT 130*   ------------------------------------------------------------------------------------------------------------------  Chemistries   Recent Labs Lab 12/07/15 0537  NA 136  K 3.7  CL 103  CO2 24  GLUCOSE 392*  BUN 49*  CREATININE 2.40*  CALCIUM 8.7*   ------------------------------------------------------------------------------------------------------------------  Cardiac Enzymes No results for input(s): TROPONINI in the last 168 hours. ------------------------------------------------------------------------------------------------------------------  RADIOLOGY:  No results found.  EKG:   Orders placed or performed during the hospital encounter of 11/01/15  . EKG 12-Lead  . EKG 12-Lead  . ED EKG within 10 minutes  . ED EKG within 10 minutes  . EKG    ASSESSMENT AND PLAN:   * Cellulitis on face continue IV Unasyn  * Hypertension, better controlled. He said he stopped taking his medication because of concern of having a medication reaction Resumed all the medications.  * Diabetes with hyperglycemia. Increase lantus to 30 units HS, increase to modrate insulin sliding scale.  * History of DVT  Coumadin PTD and f/u INR.  * Hyperlipidemia Continue statin.  * Chronic kidney disease, stage 3, stable.  * Chronic CHF. Stable, no signs of exacerbation.     All the records are reviewed and case discussed with Care Management/Social Workerr. Management plans discussed with  the patient, family and they are in  agreement.  CODE STATUS: full code  TOTAL TIME TAKING CARE OF THIS PATIENT: 38 minutes.  Greater than 50% time was spent on coordination of care and face-to-face counseling.  POSSIBLE D/C IN 2 DAYS, DEPENDING ON CLINICAL CONDITION.   Shaune Pollack M.D on 12/07/2015 at 3:15 PM  Between 7am to 6pm - Pager - [phone]  After 6pm go to [email or web address] - password [email or web address] [facility]  [facility]  Office  [phone]  CC: Primary care physician; Corky Downs, MD

## 2015-12-08 LAB — PROTIME-INR
INR: 3.13
Prothrombin Time: 31.6 seconds — ABNORMAL HIGH (ref 11.4–15.0)

## 2015-12-08 LAB — GLUCOSE, CAPILLARY: GLUCOSE-CAPILLARY: 332 mg/dL — AB (ref 65–99)

## 2015-12-08 MED ORDER — WARFARIN SODIUM 4 MG PO TABS: 4.0000 mg | ORAL_TABLET | Freq: Every day | ORAL | Status: DC

## 2015-12-08 MED ORDER — AMOXICILLIN-POT CLAVULANATE 875-125 MG PO TABS: 1.0000 | ORAL_TABLET | Freq: Two times a day (BID) | ORAL | Status: DC

## 2015-12-08 MED ORDER — MUPIROCIN 2 % EX OINT: 1.0000 "application " | TOPICAL_OINTMENT | Freq: Two times a day (BID) | CUTANEOUS | Status: DC

## 2015-12-08 NOTE — Discharge Summary (Signed)
[facility] - [place] at [facility]   PATIENT NAME: Juan Roberson    MR#:  [id number]  DATE OF BIRTH:  06/05/1971  DATE OF ADMISSION:  12/06/2015 ADMITTING PHYSICIAN: Altamese Dilling, MD  DATE OF DISCHARGE: 12/08/2015  PRIMARY CARE PHYSICIAN: Corky Downs, MD    ADMISSION DIAGNOSIS:  Cellulitis of face [L03.211]  DISCHARGE DIAGNOSIS:  Principal Problem:   Cellulitis    hyperglycemia  SECONDARY DIAGNOSIS:   Past Medical History  Diagnosis Date  . Diabetes mellitus without complication (HCC)     a. Dx ~ 1996.  . Essential hypertension   . CKD (chronic kidney disease), stage III   . GERD (gastroesophageal reflux disease)   . Cellulitis and abscess of foot     Left-Dr. Ether Griffins  . Osteomyelitis (HCC)     a. 05/2015 L foot.  . Left leg DVT (HCC)     a. Dx 05/2015 -> Coumadin.  . Gastritis     a. 04/2015 hematemesis -> EGD: gastritis, esophagitis, duodenitis.  No active bleeding.  PPI added.  . MI (myocardial infarction) [facility])     HOSPITAL COURSE:   * Cellulitis on face continue IV Unasyn   Better- d/c on oral augmentine.    He also have nasal swab positive for MRSA- started on mupirocin ointment.  * Hypertension, better controlled. He said he stopped taking his medication because of concern of having a medication reaction Resumed all the medications. Stable.  * Diabetes with hyperglycemia. Increase lantus to 30 units HS, increase to modrate insulin sliding scale. Pt said his blood sugar was completely under control at home on 25 units, and he checked 3 times a day- so likely this is due to infection, as it is coming under control- advised to continue his home regimen and call PMD- if Blood sugar is higher.  * History of DVT Coumadin and f/u INR.  * Hyperlipidemia Continue statin.  * Chronic kidney disease, stage 3, stable.  * Chronic CHF. Stable, no signs of exacerbation.   DISCHARGE CONDITIONS:   Stable.  CONSULTS OBTAINED:      DRUG ALLERGIES:   Allergies  Allergen Reactions  . Sulfur Other (See Comments)    Pt states that this medication causes renal failure.      DISCHARGE MEDICATIONS:   Current Discharge Medication List    START taking these medications   Details  amoxicillin-clavulanate (AUGMENTIN) 875-125 MG tablet Take 1 tablet by mouth 2 (two) times daily. Qty: 14 tablet, Refills: 0    mupirocin ointment (BACTROBAN) 2 % Place 1 application into the nose 2 (two) times daily. Qty: 22 g, Refills: 0      CONTINUE these medications which have NOT CHANGED   Details  atorvastatin (LIPITOR) 80 MG tablet Take 1 tablet (80 mg total) by mouth daily at 6 PM. Qty: 30 tablet, Refills: 0    carvedilol (COREG) 6.25 MG tablet Take 1 tablet by mouth 2 (two) times daily. Refills: 6    cloNIDine (CATAPRES) 0.2 MG tablet Take 1 tablet (0.2 mg total) by mouth 3 (three) times daily. Refills: 4    furosemide (LASIX) 40 MG tablet Take 1 tablet (40 mg total) by mouth 2 (two) times daily. Qty: 60 tablet, Refills: 0    losartan (COZAAR) 25 MG tablet Take 25 mg by mouth daily. Refills: 6    metoCLOPramide (REGLAN) 5 MG tablet Take 1 tablet (5 mg total) by mouth every 6 (six) hours as needed for nausea or vomiting. Qty: 30 tablet,  Refills: 0    metoprolol (LOPRESSOR) 50 MG tablet Take 1 tablet (50 mg total) by mouth 2 (two) times daily. Qty: 60 tablet, Refills: 0    pantoprazole (PROTONIX) 40 MG tablet Take 1 tablet (40 mg total) by mouth daily. Qty: 30 tablet, Refills: 0    TRESIBA FLEXTOUCH 200 UNIT/ML SOPN Inject 25 Units into the skin daily.  Refills: 5    warfarin (COUMADIN) 4 MG tablet Take 4 mg by mouth daily. Refills: 6    insulin aspart (NOVOLOG) 100 UNIT/ML injection Inject 0-9 Units into the skin 3 (three) times daily with meals. Qty: 10 mL, Refills: 11         DISCHARGE INSTRUCTIONS:    Follow with PMD in office in 1-2 weeks.  If you experience worsening of your admission  symptoms, develop shortness of breath, life threatening emergency, suicidal or homicidal thoughts you must seek medical attention immediately by calling 911 or calling your MD immediately  if symptoms less severe.  You Must read complete instructions/literature along with all the possible adverse reactions/side effects for all the Medicines you take and that have been prescribed to you. Take any new Medicines after you have completely understood and accept all the possible adverse reactions/side effects.   Please note  You were cared for by a hospitalist during your hospital stay. If you have any questions about your discharge medications or the care you received while you were in the hospital after you are discharged, you can call the unit and asked to speak with the hospitalist on call if the hospitalist that took care of you is not available. Once you are discharged, your primary care physician will handle any further medical issues. Please note that NO REFILLS for any discharge medications will be authorized once you are discharged, as it is imperative that you return to your primary care physician (or establish a relationship with a primary care physician if you do not have one) for your aftercare needs so that they can reassess your need for medications and monitor your lab values.    Today   CHIEF COMPLAINT:   Chief Complaint  Patient presents with  . Allergic Reaction  . Rash    HISTORY OF PRESENT ILLNESS:  Juan Roberson  is a 45 y.o. male with a known history of diabetes, essential hypertension, chronic kidney disease, cellulitis and abscess of the foot, osteoarthritis, left leg DVT on Coumadin currently, myocardial infarction- said that his primary care physician recently started on carvedilol and atorvastatin a few weeks ago. For last 3-4 days he started having swelling and redness on his face with some small papules on his back also and this is itching, he also have some chills and  fever. He spoke to his primary care physician he suggested maybe he has allergy to his new medication is started and told him to take Benadryl, but he did not get relief so came to emergency room. Started on antibiotic by ER physician and given for admission.  VITAL SIGNS:  Blood pressure 138/58, pulse 89, temperature 97.7 F (36.5 C), temperature source Oral, resp. rate 18, height 6\' 2"  (1.88 m), weight 79.652 kg (175 lb 9.6 oz), SpO2 100 %.  I/O:  No intake or output data in the 24 hours ending 12/08/15 0929  PHYSICAL EXAMINATION:   GENERAL: 45 y.o.-year-old patient lying in the bed with no acute distress.  EYES: Pupils equal, round, reactive to light and accommodation. No scleral icterus. Extraocular muscles intact.  HEENT: Head  atraumatic, normocephalic. Oropharynx and nasopharynx clear. Erythema and swelling on face (both side), no tenderness. NECK: Supple, no jugular venous distention. No thyroid enlargement, no tenderness.  LUNGS: Normal breath sounds bilaterally, no wheezing, rales,rhonchi or crepitation. No use of accessory muscles of respiration.  CARDIOVASCULAR: S1, S2 normal. No murmurs, rubs, or gallops.  ABDOMEN: Soft, nontender, nondistended. Bowel sounds present. No organomegaly or mass.  EXTREMITIES: No pedal edema, cyanosis, or clubbing.  NEUROLOGIC: Cranial nerves II through XII are intact. Muscle strength 5/5 in all extremities. Sensation intact. Gait not checked.  PSYCHIATRIC: The patient is alert and oriented x 3.  SKIN: No obvious rash, lesion, or ulcer.   DATA REVIEW:   CBC  Recent Labs Lab 12/07/15 0537  WBC 10.2  HGB 9.6*  HCT 29.7*  PLT 130*    Chemistries   Recent Labs Lab 12/07/15 0537  NA 136  K 3.7  CL 103  CO2 24  GLUCOSE 392*  BUN 49*  CREATININE 2.40*  CALCIUM 8.7*    Cardiac Enzymes No results for input(s): TROPONINI in the last 168 hours.  Microbiology Results  Results for orders placed or performed during the  hospital encounter of 12/06/15  Rapid Influenza A&B Antigens ([facility] only)     Status: None   Collection Time: 12/06/15  2:51 PM  Result Value Ref Range Status   Influenza A ([facility]) NEGATIVE NEGATIVE Final   Influenza B ([facility]) NEGATIVE NEGATIVE Final  MRSA PCR Screening     Status: Abnormal   Collection Time: 12/06/15  9:16 PM  Result Value Ref Range Status   MRSA by PCR POSITIVE (A) NEGATIVE Final    Comment:        The GeneXpert MRSA Assay (FDA approved for NASAL specimens only), is one component of a comprehensive MRSA colonization surveillance program. It is not intended to diagnose MRSA infection nor to guide or monitor treatment for MRSA infections. CRITICAL RESULT CALLED TO, READ BACK BY AND VERIFIED WITH: CALLED TO DRUSILLA JACKSON AT 2315 ON 12/06/15 BY VAB     RADIOLOGY:  No results found.    Management plans discussed with the patient, family and they are in agreement.  CODE STATUS:     Code Status Orders        Start     Ordered   12/06/15 2039  Full code   Continuous     12/06/15 2038    Code Status History    Date Active Date Inactive Code Status Order ID Comments User Context   11/01/2015  4:00 PM 11/03/2015  2:28 PM Full Code [id number]  Wyatt Haste, MD ED   06/29/2015  2:30 AM 07/01/2015  2:21 PM Full Code [id number]  Arnaldo Natal, MD Inpatient   05/21/2015  4:34 PM 05/25/2015  4:34 PM Full Code [id number]  Linus Galas, MD Inpatient   05/18/2015  9:17 PM 05/21/2015  4:34 PM Full Code [id number]  Shaune Pollack, MD Inpatient   04/05/2015  7:05 AM 04/08/2015  2:00 PM Full Code [id number]  Arnaldo Natal, MD Inpatient      TOTAL TIME TAKING CARE OF THIS PATIENT: 35 minutes.    Altamese Dilling M.D on 12/08/2015 at 9:29 AM  Between 7am to 6pm - Pager - [phone]  After 6pm go to [email or web address] - password [email or web address]  [facility]  Office  [phone]  CC: Primary care physician; Corky Downs, MD   Note: This dictation was prepared  with Dragon dictation along with smaller phrase technology. Any  transcriptional errors that result from this process are unintentional.

## 2015-12-08 NOTE — Discharge Instructions (Signed)
Check Blood sugar daily and IF > 300, call your PMD to change the dose of insulin.

## 2015-12-08 NOTE — Progress Notes (Signed)
ANTICOAGULATION CONSULT NOTE - Initial Consult  Pharmacy Consult for warfarin  Indication: history of  DVT  Allergies  Allergen Reactions  . Sulfur Other (See Comments)    Pt states that this medication causes renal failure.      Patient Measurements: Height:  (188 cm) Weight: 175 lb 9.6 oz (79.652 kg) IBW/kg (Calculated) : 82.2 Heparin Dosing Weight:   Vital Signs: Temp: 97.7 F (36.5 C) (03/08 0528) Temp Source: Oral (03/08 0528) BP: 138/58 mmHg (03/08 0900) Pulse Rate: 89 (03/08 0528)  Labs:  Recent Labs  12/06/15 1451 12/07/15 0537 12/08/15 0431  HGB 9.7* 9.6*  --   HCT 30.2* 29.7*  --   PLT 133* 130*  --   LABPROT 27.3* 21.7* 31.6*  INR 2.58 1.90 3.13  CREATININE 2.24* 2.40*  --     Estimated Creatinine Clearance: 43.4 mL/min (by C-G formula based on Cr of 2.4).   Medical History: Past Medical History  Diagnosis Date  . Diabetes mellitus without complication (HCC)     a. Dx ~ 1996.  . Essential hypertension   . CKD (chronic kidney disease), stage III   . GERD (gastroesophageal reflux disease)   . Cellulitis and abscess of foot     Left-Dr. Ether Griffins  . Osteomyelitis (HCC)     a. 05/2015 L foot.  . Left leg DVT (HCC)     a. Dx 05/2015 -> Coumadin.  . Gastritis     a. 04/2015 hematemesis -> EGD: gastritis, esophagitis, duodenitis.  No active bleeding.  PPI added.  . MI (myocardial infarction) (HCC)     Medications:  Prescriptions prior to admission  Medication Sig Dispense Refill Last Dose  . atorvastatin (LIPITOR) 80 MG tablet Take 1 tablet (80 mg total) by mouth daily at 6 PM. 30 tablet 0 12/03/2015  . carvedilol (COREG) 6.25 MG tablet Take 1 tablet by mouth 2 (two) times daily.  6 12/03/2015  . cloNIDine (CATAPRES) 0.2 MG tablet Take 1 tablet (0.2 mg total) by mouth 3 (three) times daily.  4 12/03/2015  . furosemide (LASIX) 40 MG tablet Take 1 tablet (40 mg total) by mouth 2 (two) times daily. 60 tablet 0 12/03/2015  . losartan (COZAAR) 25 MG tablet  Take 25 mg by mouth daily.  6 12/03/2015  . metoCLOPramide (REGLAN) 5 MG tablet Take 1 tablet (5 mg total) by mouth every 6 (six) hours as needed for nausea or vomiting. 30 tablet 0 prn  . metoprolol (LOPRESSOR) 50 MG tablet Take 1 tablet (50 mg total) by mouth 2 (two) times daily. 60 tablet 0 12/03/2015  . pantoprazole (PROTONIX) 40 MG tablet Take 1 tablet (40 mg total) by mouth daily. 30 tablet 0 12/03/2015  . TRESIBA FLEXTOUCH 200 UNIT/ML SOPN Inject 25 Units into the skin daily.   5 12/03/2015  . warfarin (COUMADIN) 4 MG tablet Take 4 mg by mouth daily.  6 12/03/2015 at 0600  . insulin aspart (NOVOLOG) 100 UNIT/ML injection Inject 0-9 Units into the skin 3 (three) times daily with meals. (Patient not taking: Reported on 06/28/2015) 10 mL 11 Not Taking at Unknown time   Scheduled:  . ampicillin-sulbactam (UNASYN) IV  3 g Intravenous Q6H  . atorvastatin  80 mg Oral q1800  . carvedilol  12.5 mg Oral BID  . Chlorhexidine Gluconate Cloth  6 each Topical Q0600  . cloNIDine  0.2 mg Oral TID  . furosemide  40 mg Oral BID  . insulin aspart  0-15 Units Subcutaneous TID WC  .  insulin aspart  0-5 Units Subcutaneous QHS  . insulin glargine  30 Units Subcutaneous QHS  . losartan  25 mg Oral Daily  . mupirocin ointment  1 application Nasal BID  . pantoprazole  40 mg Oral Daily  . warfarin  4 mg Oral q1800    Assessment: Pharmacy consulted to dose and monitor warfarin therapy for this 45 year old patient with a history of DVT  Home dose: 4 mg PO daily   3/6: INR: 2.58; warfarin 4mg   3/7: INR: 1.90; warfarin 9mg  total 3/8: INR: 3.13; hold warfarin dose today   Potential DDI: Unasyn   Goal of Therapy:  INR 2-3 Monitor platelets by anticoagulation protocol: Yes   Plan:  Patient received a total of 9mg  of warfarin on 3/7 (4mg  in the AM and 5mg  at [facility]6PM). INR is slightly elevated today, will hold today's dose of warfarin and then will restart warfarin 4 mg PO daily on 3/9 if INR is within range. PT/INR  with am labs. Pharmacy to follow.   Clovia CuffLisa Zyah Gomm, PharmD, BCPS 12/08/2015 10:15 AM

## 2016-01-07 ENCOUNTER — Encounter: Payer: Self-pay | Admitting: *Deleted

## 2016-01-07 ENCOUNTER — Emergency Department: Payer: BLUE CROSS/BLUE SHIELD

## 2016-01-07 ENCOUNTER — Emergency Department
Admission: EM | Admit: 2016-01-07 | Discharge: 2016-01-07 | Disposition: A | Discharge status: Home / Self Care | Payer: BLUE CROSS/BLUE SHIELD | Attending: Emergency Medicine | Admitting: Emergency Medicine

## 2016-01-07 DIAGNOSIS — I2510 Atherosclerotic heart disease of native coronary artery without angina pectoris: Secondary | ICD-10-CM | POA: Insufficient documentation

## 2016-01-07 DIAGNOSIS — Z86718 Personal history of other venous thrombosis and embolism: Secondary | ICD-10-CM | POA: Insufficient documentation

## 2016-01-07 DIAGNOSIS — M25461 Effusion, right knee: Secondary | ICD-10-CM | POA: Insufficient documentation

## 2016-01-07 DIAGNOSIS — Z79899 Other long term (current) drug therapy: Secondary | ICD-10-CM | POA: Insufficient documentation

## 2016-01-07 DIAGNOSIS — Z888 Allergy status to other drugs, medicaments and biological substances status: Secondary | ICD-10-CM | POA: Insufficient documentation

## 2016-01-07 DIAGNOSIS — I129 Hypertensive chronic kidney disease with stage 1 through stage 4 chronic kidney disease, or unspecified chronic kidney disease: Secondary | ICD-10-CM | POA: Insufficient documentation

## 2016-01-07 DIAGNOSIS — N183 Chronic kidney disease, stage 3 (moderate): Secondary | ICD-10-CM | POA: Insufficient documentation

## 2016-01-07 DIAGNOSIS — M25561 Pain in right knee: Secondary | ICD-10-CM | POA: Diagnosis present

## 2016-01-07 DIAGNOSIS — E1122 Type 2 diabetes mellitus with diabetic chronic kidney disease: Secondary | ICD-10-CM | POA: Insufficient documentation

## 2016-01-07 DIAGNOSIS — Z7901 Long term (current) use of anticoagulants: Secondary | ICD-10-CM | POA: Insufficient documentation

## 2016-01-07 DIAGNOSIS — Z794 Long term (current) use of insulin: Secondary | ICD-10-CM | POA: Diagnosis not present

## 2016-01-07 MED ORDER — ONDANSETRON 4 MG PO TBDP
4.0000 mg | ORAL_TABLET | Freq: Once | ORAL | Status: AC
  Administered 2016-01-07: 4 mg via ORAL
  Filled 2016-01-07: qty 1

## 2016-01-07 MED ORDER — HYDROMORPHONE HCL 1 MG/ML IJ SOLN
1.0000 mg | Freq: Once | INTRAMUSCULAR | Status: AC
  Administered 2016-01-07: 1 mg via INTRAMUSCULAR
  Filled 2016-01-07: qty 1

## 2016-01-07 MED ORDER — OXYCODONE-ACETAMINOPHEN 5-325 MG PO TABS: 1.0000 | ORAL_TABLET | ORAL | Status: DC | PRN

## 2016-01-07 NOTE — ED Provider Notes (Signed)
[facility] Emergency Department Provider Note  ____________________________________________  Time seen: Approximately 9:59 AM  I have reviewed the triage vital signs and the nursing notes.   HISTORY  Chief Complaint Knee Pain    HPI Juan BlossomWaltzie L Rousseau Jr. is a 45 y.o. male is for evaluation of right knee pain. Patient states that he set off a curb yesterday and felt a popping sensation in his knee. Last night the knee continued to swell and is unable to bear weight this morning. Past medical history is significant for cardiac issues insulin dependent diabetic and on Coumadin. Patient does have chronic kidney disease stage III as well.   Past Medical History  Diagnosis Date  . Diabetes mellitus without complication (HCC)     a. Dx ~ 1996.  . Essential hypertension   . CKD (chronic kidney disease), stage III   . GERD (gastroesophageal reflux disease)   . Cellulitis and abscess of foot     Left-Dr. Ether GriffinsFowler  . Osteomyelitis (HCC)     a. 05/2015 L foot.  . Left leg DVT (HCC)     a. Dx 05/2015 -> Coumadin.  . Gastritis     a. 04/2015 hematemesis -> EGD: gastritis, esophagitis, duodenitis.  No active bleeding.  PPI added.  . MI (myocardial infarction) [facility](HCC)     Patient Active Problem List   Diagnosis Date Noted  . Cellulitis 12/06/2015  . AKI (acute kidney injury) (HCC) 11/01/2015  . Hyperkalemia 11/01/2015    Past Surgical History  Procedure Laterality Date  . I&d extremity Left 04/06/2015    Procedure: IRRIGATION AND DEBRIDEMENT EXTREMITY;  Surgeon: Gwyneth RevelsJustin Fowler, DPM;  Location: [facility] ORS;  Service: Podiatry;  Laterality: Left;  . Esophagogastroduodenoscopy N/A 04/07/2015    Procedure: ESOPHAGOGASTRODUODENOSCOPY (EGD);  Surgeon: Midge Miniumarren Wohl, MD;  [facility];  Service: Endoscopy;  Laterality: N/A;  . Foot surgery    . Irrigation and debridement foot Left 05/21/2015    Procedure: IRRIGATION AND DEBRIDEMENT FOOT;  Surgeon: Linus Galasodd Cline, MD;   Location: [facility] ORS;  Service: Podiatry;  Laterality: Left;  . Esophagogastroduodenoscopy (egd) with propofol Left 06/30/2015    Procedure: ESOPHAGOGASTRODUODENOSCOPY (EGD) WITH PROPOFOL;  Surgeon: Christena DeemMartin U Skulskie, MD;  [facility];  Service: Endoscopy;  Laterality: Left;    Current Outpatient Rx  Name  Route  Sig  Dispense  Refill  . amoxicillin-clavulanate (AUGMENTIN) 875-125 MG tablet   Oral   Take 1 tablet by mouth 2 (two) times daily.   14 tablet   0   . atorvastatin (LIPITOR) 80 MG tablet   Oral   Take 1 tablet (80 mg total) by mouth daily at 6 PM.   30 tablet   0   . carvedilol (COREG) 6.25 MG tablet   Oral   Take 1 tablet by mouth 2 (two) times daily.      6   . cloNIDine (CATAPRES) 0.2 MG tablet   Oral   Take 1 tablet (0.2 mg total) by mouth 3 (three) times daily.      4   . furosemide (LASIX) 40 MG tablet   Oral   Take 1 tablet (40 mg total) by mouth 2 (two) times daily.   60 tablet   0   . insulin aspart (NOVOLOG) 100 UNIT/ML injection   Subcutaneous   Inject 0-9 Units into the skin 3 (three) times daily with meals. Patient not taking: Reported on 06/28/2015   10 mL   11   . losartan (COZAAR) 25  MG tablet   Oral   Take 25 mg by mouth daily.      6   . metoCLOPramide (REGLAN) 5 MG tablet   Oral   Take 1 tablet (5 mg total) by mouth every 6 (six) hours as needed for nausea or vomiting.   30 tablet   0   . metoprolol (LOPRESSOR) 50 MG tablet   Oral   Take 1 tablet (50 mg total) by mouth 2 (two) times daily.   60 tablet   0   . mupirocin ointment (BACTROBAN) 2 %   Nasal   Place 1 application into the nose 2 (two) times daily.   22 g   0   . oxyCODONE-acetaminophen (ROXICET) 5-325 MG tablet   Oral   Take 1-2 tablets by mouth every 4 (four) hours as needed for severe pain.   30 tablet   0   . pantoprazole (PROTONIX) 40 MG tablet   Oral   Take 1 tablet (40 mg total) by mouth daily.   30 tablet   0   . TRESIBA FLEXTOUCH  200 UNIT/ML SOPN   Subcutaneous   Inject 25 Units into the skin daily.       5     Dispense as written.   . warfarin (COUMADIN) 4 MG tablet   Oral   Take 4 mg by mouth daily.      6     Allergies Sulfur  Family History  Problem Relation Age of Onset  . Coronary artery disease Father   . Pancreatic cancer Mother   . Breast cancer Sister   . Lung cancer Brother   . Pancreatic cancer Mother     Social History Social History  Substance Use Topics  . Smoking status: Never Smoker   . Smokeless tobacco: None  . Alcohol Use: No    Review of Systems Constitutional: No fever/chills Cardiovascular: Denies chest pain. Respiratory: Denies shortness of breath. Musculoskeletal: Right knee with obvious swelling 2-3+. Distally neurovascularly intact. Positive warmth and tenderness to the knee itself limited range of motion. Skin: Negative for rash. Neurological: Negative for headaches, focal weakness or numbness.  10-point ROS otherwise negative.  ____________________________________________   PHYSICAL EXAM:  VITAL SIGNS: ED Triage Vitals  Enc Vitals Group     BP 01/07/16 0930 163/88 mmHg     Pulse Rate 01/07/16 0930 83     Resp 01/07/16 0930 20     Temp 01/07/16 0930 97.8 F (36.6 C)     Temp src --      SpO2 01/07/16 0930 100 %     Weight 01/07/16 0930 180 lb (81.647 kg)     Height 01/07/16 0930  (1.88 m)     Head Cir --      Peak Flow --      Pain Score 01/07/16 0932 10     Pain Loc --      Pain Edu? --      Excl. in GC? --     Constitutional: Alert and oriented. Well appearing and in no acute distress. Cardiovascular: Normal rate, regular rhythm. Grossly normal heart sounds.  Good peripheral circulation. Respiratory: Normal respiratory effort.  No retractions. Lungs CTAB. Musculoskeletal: Positive edema and tenderness with joint effusion noted to the right knee. Positive warmth and heat with limited range of motion. Neurologic:  Normal speech and  language. No gross focal neurologic deficits are appreciated. No gait instability. Skin:  Skin is warm, dry and intact. No rash  noted. Psychiatric: Mood and affect are normal. Speech and behavior are normal.  ____________________________________________   LABS (all labs ordered are listed, but only abnormal results are displayed)  Labs Reviewed - No data to display  RADIOLOGY  Large effusion right knee. ____________________________________________   PROCEDURES  Procedure(s) performed: None  Critical Care performed: No  ____________________________________________   INITIAL IMPRESSION / ASSESSMENT AND PLAN / ED COURSE  Pertinent labs & imaging results that were available during my care of the patient were reviewed by me and considered in my medical decision making (see chart for details).  Large knee effusion. Knee immobilizer provided patient has his own crutches ice elevation Rx given for Percocet. Patient to follow-up with Dr. Martha Clan next week. Work excuse given. ____________________________________________   FINAL CLINICAL IMPRESSION(S) / ED DIAGNOSES  Final diagnoses:  Knee effusion, right     This chart was dictated using voice recognition software/Dragon. Despite best efforts to proofread, errors can occur which can change the meaning. Any change was purely unintentional.   Evangeline Dakin, PA-C 01/07/16 1128  Myrna Blazer, MD 01/07/16 [phone]

## 2016-01-07 NOTE — ED Notes (Addendum)
Pt states he stepped off the curb wrong last night and now states right knee pain and swelling, states he is unable to bear weight, pt on coumadin

## 2016-01-07 NOTE — ED Notes (Signed)
Reports stepping off the curb yesterday, pain to right knee.  Swelling noted.  +PMS to foot.

## 2016-01-07 NOTE — Discharge Instructions (Signed)
Knee Effusion °Knee effusion means that you have excess fluid in your knee joint. This can cause pain and swelling in your knee. This may make your knee more difficult to bend and move. That is because there is increased pain and pressure in the joint. If there is fluid in your knee, it often means that something is wrong inside your knee, such as severe arthritis, abnormal inflammation, or an infection. Another common cause of knee effusion is an injury to the knee muscles, ligaments, or cartilage. °HOME CARE INSTRUCTIONS °· Use crutches as directed by your health care provider. °· Wear a knee brace as directed by your health care provider. °· Apply ice to the swollen area: °¨ Put ice in a plastic bag. °¨ Place a towel between your skin and the bag. °¨ Leave the ice on for 20 minutes, 2-3 times per day. °· Keep your knee raised (elevated) when you are sitting or lying down. °· Take medicines only as directed by your health care provider. °· Do any rehabilitation or strengthening exercises as directed by your health care provider. °· Rest your knee as directed by your health care provider. You may start doing your normal activities again when your health care provider approves.    °· Keep all follow-up visits as directed by your health care provider. This is important. °SEEK MEDICAL CARE IF: °· You have ongoing (persistent) pain in your knee. °SEEK IMMEDIATE MEDICAL CARE IF: °· You have increased swelling or redness of your knee. °· You have severe pain in your knee. °· You have a fever. °  °This information is not intended to replace advice given to you by your health care provider. Make sure you discuss any questions you have with your health care provider. °  °Document Released: 12/09/2003 Document Revised: 10/09/2014 Document Reviewed: 05/04/2014 °Elsevier Interactive Patient Education ©2016 [employer] Inc[employer] ° °

## 2016-01-21 ENCOUNTER — Encounter: Payer: Self-pay | Admitting: Internal Medicine

## 2016-01-25 ENCOUNTER — Telehealth: Payer: Self-pay | Admitting: Gastroenterology

## 2016-01-25 NOTE — Telephone Encounter (Signed)
Left voice message on  4/21 and 4/26 for patient to call and schedule appointment with Dr. Servando SnareWohl for vomiting and low hemoglobin

## 2016-02-23 ENCOUNTER — Inpatient Hospital Stay: Payer: BLUE CROSS/BLUE SHIELD | Attending: Internal Medicine | Admitting: Internal Medicine

## 2016-02-27 ENCOUNTER — Inpatient Hospital Stay
Admission: EM | Admit: 2016-02-27 | Discharge: 2016-03-01 | DRG: 684 | Disposition: A | Discharge status: Home / Self Care | Payer: BLUE CROSS/BLUE SHIELD | Attending: Internal Medicine | Admitting: Internal Medicine

## 2016-02-27 ENCOUNTER — Encounter: Payer: Self-pay | Admitting: Emergency Medicine

## 2016-02-27 DIAGNOSIS — Z794 Long term (current) use of insulin: Secondary | ICD-10-CM

## 2016-02-27 DIAGNOSIS — Z882 Allergy status to sulfonamides status: Secondary | ICD-10-CM

## 2016-02-27 DIAGNOSIS — N179 Acute kidney failure, unspecified: Secondary | ICD-10-CM | POA: Diagnosis not present

## 2016-02-27 DIAGNOSIS — I2510 Atherosclerotic heart disease of native coronary artery without angina pectoris: Secondary | ICD-10-CM | POA: Diagnosis present

## 2016-02-27 DIAGNOSIS — E860 Dehydration: Secondary | ICD-10-CM | POA: Diagnosis present

## 2016-02-27 DIAGNOSIS — Z803 Family history of malignant neoplasm of breast: Secondary | ICD-10-CM

## 2016-02-27 DIAGNOSIS — Z7901 Long term (current) use of anticoagulants: Secondary | ICD-10-CM

## 2016-02-27 DIAGNOSIS — N189 Chronic kidney disease, unspecified: Secondary | ICD-10-CM

## 2016-02-27 DIAGNOSIS — E119 Type 2 diabetes mellitus without complications: Secondary | ICD-10-CM

## 2016-02-27 DIAGNOSIS — R600 Localized edema: Secondary | ICD-10-CM | POA: Diagnosis present

## 2016-02-27 DIAGNOSIS — Z801 Family history of malignant neoplasm of trachea, bronchus and lung: Secondary | ICD-10-CM

## 2016-02-27 DIAGNOSIS — E1165 Type 2 diabetes mellitus with hyperglycemia: Secondary | ICD-10-CM | POA: Diagnosis present

## 2016-02-27 DIAGNOSIS — R809 Proteinuria, unspecified: Secondary | ICD-10-CM | POA: Diagnosis present

## 2016-02-27 DIAGNOSIS — Z8249 Family history of ischemic heart disease and other diseases of the circulatory system: Secondary | ICD-10-CM

## 2016-02-27 DIAGNOSIS — N183 Chronic kidney disease, stage 3 (moderate): Secondary | ICD-10-CM | POA: Diagnosis present

## 2016-02-27 DIAGNOSIS — I129 Hypertensive chronic kidney disease with stage 1 through stage 4 chronic kidney disease, or unspecified chronic kidney disease: Secondary | ICD-10-CM | POA: Diagnosis present

## 2016-02-27 DIAGNOSIS — N170 Acute kidney failure with tubular necrosis: Principal | ICD-10-CM | POA: Diagnosis present

## 2016-02-27 DIAGNOSIS — Z79899 Other long term (current) drug therapy: Secondary | ICD-10-CM

## 2016-02-27 DIAGNOSIS — D649 Anemia, unspecified: Secondary | ICD-10-CM

## 2016-02-27 DIAGNOSIS — D631 Anemia in chronic kidney disease: Secondary | ICD-10-CM | POA: Diagnosis present

## 2016-02-27 DIAGNOSIS — T502X5A Adverse effect of carbonic-anhydrase inhibitors, benzothiadiazides and other diuretics, initial encounter: Secondary | ICD-10-CM | POA: Diagnosis present

## 2016-02-27 DIAGNOSIS — E785 Hyperlipidemia, unspecified: Secondary | ICD-10-CM | POA: Diagnosis present

## 2016-02-27 DIAGNOSIS — E1142 Type 2 diabetes mellitus with diabetic polyneuropathy: Secondary | ICD-10-CM | POA: Diagnosis present

## 2016-02-27 DIAGNOSIS — I252 Old myocardial infarction: Secondary | ICD-10-CM

## 2016-02-27 DIAGNOSIS — E1122 Type 2 diabetes mellitus with diabetic chronic kidney disease: Secondary | ICD-10-CM | POA: Diagnosis present

## 2016-02-27 DIAGNOSIS — Z86718 Personal history of other venous thrombosis and embolism: Secondary | ICD-10-CM

## 2016-02-27 DIAGNOSIS — K219 Gastro-esophageal reflux disease without esophagitis: Secondary | ICD-10-CM | POA: Diagnosis present

## 2016-02-27 DIAGNOSIS — IMO0001 Reserved for inherently not codable concepts without codable children: Secondary | ICD-10-CM

## 2016-02-27 DIAGNOSIS — Z800 Family history of malignant neoplasm of digestive organs: Secondary | ICD-10-CM

## 2016-02-27 LAB — CBC
HCT: 24.9 % — ABNORMAL LOW (ref 40.0–52.0)
Hemoglobin: 7.9 g/dL — ABNORMAL LOW (ref 13.0–18.0)
MCH: 25.7 pg — ABNORMAL LOW (ref 26.0–34.0)
MCHC: 31.8 g/dL — ABNORMAL LOW (ref 32.0–36.0)
MCV: 80.7 fL (ref 80.0–100.0)
PLATELETS: 160 10*3/uL (ref 150–440)
RBC: 3.08 MIL/uL — AB (ref 4.40–5.90)
RDW: 17.1 % — ABNORMAL HIGH (ref 11.5–14.5)
WBC: 4.8 10*3/uL (ref 3.8–10.6)

## 2016-02-27 LAB — BASIC METABOLIC PANEL
Anion gap: 12 (ref 5–15)
BUN: 102 mg/dL — ABNORMAL HIGH (ref 6–20)
CALCIUM: 9 mg/dL (ref 8.9–10.3)
CO2: 23 mmol/L (ref 22–32)
CREATININE: 4.82 mg/dL — AB (ref 0.61–1.24)
Chloride: 101 mmol/L (ref 101–111)
GFR, EST AFRICAN AMERICAN: 15 mL/min — AB (ref >60)
GFR, EST NON AFRICAN AMERICAN: 13 mL/min — AB (ref >60)
Glucose, Bld: 267 mg/dL — ABNORMAL HIGH (ref 65–99)
Potassium: 4.6 mmol/L (ref 3.5–5.1)
SODIUM: 136 mmol/L (ref 135–145)

## 2016-02-27 LAB — GLUCOSE, CAPILLARY: Glucose-Capillary: 246 mg/dL — ABNORMAL HIGH (ref 65–99)

## 2016-02-27 MED ORDER — SODIUM CHLORIDE 0.9 % IV BOLUS (SEPSIS)
1000.0000 mL | INTRAVENOUS | Status: AC
  Administered 2016-02-27: 1000 mL via INTRAVENOUS

## 2016-02-27 NOTE — ED Provider Notes (Signed)
[facility] Emergency Department Provider Note  ____________________________________________  Time seen: Approximately 11:41 PM  I have reviewed the triage vital signs and the nursing notes.   HISTORY  Chief Complaint Hyperglycemia and Blurred Vision    HPI Juan Roberson. is a 45 y.o. male with history of insulin-dependent diabetes and who recently was started on a higher dose of Lasix by his kidney doctor, Dr. Cherylann Ratel, up to 80 mg twice a day who presents with complaints of hyperglycemia, lightheadedness, weakness, decreased appetite, increased thirst, and dizziness.  He says that the symptoms have been gradual in onset over the last couple days.  He has had no nausea nor vomiting, no chest pain, no abdominal pain, no dysuria.  He has also not had any fever or chills.  He states that he has been drinking a lot of fluid.  Several weeks ago he had significant peripheral edema and redness on the Lasix was increased.  His blood sugar tonight and it was over 400 which is very unusual for him, typically his blood sugar is below 200.  His symptoms are moderate and nothing makes them better or worse.  They are gradual in onset.   Past Medical History  Diagnosis Date  . Diabetes mellitus without complication (HCC)     a. Dx ~ 1996.  . Essential hypertension   . CKD (chronic kidney disease), stage III   . GERD (gastroesophageal reflux disease)   . Cellulitis and abscess of foot     Left-Dr. Ether Griffins  . Osteomyelitis (HCC)     a. 05/2015 L foot.  . Left leg DVT (HCC)     a. Dx 05/2015 -> Coumadin.  . Gastritis     a. 04/2015 hematemesis -> EGD: gastritis, esophagitis, duodenitis.  No active bleeding.  PPI added.  . MI (myocardial infarction) [facility])     Patient Active Problem List   Diagnosis Date Noted  . Acute on chronic kidney failure (HCC) 02/28/2016  . Cellulitis 12/06/2015  . AKI (acute kidney injury) (HCC) 11/01/2015  . Hyperkalemia 11/01/2015     Past Surgical History  Procedure Laterality Date  . I&d extremity Left 04/06/2015    Procedure: IRRIGATION AND DEBRIDEMENT EXTREMITY;  Surgeon: Gwyneth Revels, DPM;  Location: [facility] ORS;  Service: Podiatry;  Laterality: Left;  . Esophagogastroduodenoscopy N/A 04/07/2015    Procedure: ESOPHAGOGASTRODUODENOSCOPY (EGD);  Surgeon: Midge Minium, MD;  Location: [facility] ENDOSCOPY;  Service: Endoscopy;  Laterality: N/A;  . Foot surgery    . Irrigation and debridement foot Left 05/21/2015    Procedure: IRRIGATION AND DEBRIDEMENT FOOT;  Surgeon: Linus Galas, MD;  Location: [facility] ORS;  Service: Podiatry;  Laterality: Left;  . Esophagogastroduodenoscopy (egd) with propofol Left 06/30/2015    Procedure: ESOPHAGOGASTRODUODENOSCOPY (EGD) WITH PROPOFOL;  Surgeon: Christena Deem, MD;  Location: [facility] ENDOSCOPY;  Service: Endoscopy;  Laterality: Left;    Current Outpatient Rx  Name  Route  Sig  Dispense  Refill  . atorvastatin (LIPITOR) 80 MG tablet   Oral   Take 1 tablet (80 mg total) by mouth daily at 6 PM.   30 tablet   0   . B-D ULTRAFINE III SHORT PEN 31G X 8 MM MISC   Subcutaneous   Inject 1 pen into the skin daily.      4     Dispense as written.   . carvedilol (COREG) 6.25 MG tablet   Oral   Take 1 tablet by mouth 2 (two) times daily.  6   . cloNIDine (CATAPRES) 0.2 MG tablet   Oral   Take 1 tablet (0.2 mg total) by mouth 3 (three) times daily.      4   . furosemide (LASIX) 40 MG tablet   Oral   Take 1 tablet (40 mg total) by mouth 2 (two) times daily.   60 tablet   0   . losartan (COZAAR) 25 MG tablet   Oral   Take 25 mg by mouth daily.      6   . metolazone (ZAROXOLYN) 5 MG tablet   Oral   Take 1 tablet by mouth every other day.      3   . omega-3 fish oil (MAXEPA) 1000 MG CAPS capsule   Oral   Take 1 capsule by mouth daily.         [name]. senna (SENOKOT) 8.6 MG TABS tablet   Oral   Take 1 tablet by mouth daily.         . TRESIBA FLEXTOUCH 200 UNIT/ML SOPN    Subcutaneous   Inject 25 Units into the skin daily.       5     Dispense as written.     Allergies Sulfur  Family History  Problem Relation Age of Onset  . Coronary artery disease Father   . Pancreatic cancer Mother   . Breast cancer Sister   . Lung cancer Brother   . Pancreatic cancer Mother     Social History Social History  Substance Use Topics  . Smoking status: Never Smoker   . Smokeless tobacco: Never Used  . Alcohol Use: No    Review of Systems Constitutional: No fever/chills.  Generalized weakness and fatigue.  Some lightheadedness and dizziness Eyes: No visual changes. ENT: No sore throat. Cardiovascular: Denies chest pain. Respiratory: Denies shortness of breath. Gastrointestinal: No abdominal pain.  No nausea, no vomiting.  No diarrhea.  No constipation. Genitourinary: Negative for dysuria. Musculoskeletal: Negative for back pain. Skin: Negative for rash. Neurological: Negative for headaches, focal weakness or numbness.  Generalized lightheadedness and dizziness.  10-point ROS otherwise negative.  ____________________________________________   PHYSICAL EXAM:  VITAL SIGNS: ED Triage Vitals  Enc Vitals Group     BP 02/27/16 2242 109/62 mmHg     Pulse Rate 02/27/16 2242 85     Resp 02/27/16 2242 18     Temp 02/27/16 2242 97.7 F (36.5 C)     Temp Source 02/27/16 2242 Oral     SpO2 02/27/16 2242 98 %     Weight 02/27/16 2242 183 lb (83.008 kg)     Height 02/27/16 2242 6\' 2"  (1.88 m)     Head Cir --      Peak Flow --      Pain Score --      Pain Loc --      Pain Edu? --      Excl. in GC? --     Constitutional: Alert and oriented. Well appearing and in no acute distress. Eyes: Conjunctivae are normal. PERRL. EOMI. Head: Atraumatic. Nose: No congestion/rhinnorhea. Mouth/Throat: Mucous membranes are Tacky.  Oropharynx non-erythematous. Neck: No stridor.  No meningeal signs.   Cardiovascular: Normal rate, regular rhythm. Good peripheral  circulation. Grossly normal heart sounds.   Respiratory: Normal respiratory effort.  No retractions. Lungs CTAB. Gastrointestinal: Soft and nontender. No distention.  Musculoskeletal: No lower extremity tenderness nor edema. No gross deformities of extremities. Neurologic:  Normal speech and language. No gross focal neurologic deficits  are appreciated.  Skin:  Skin is warm, dry and intact. No rash noted. Psychiatric: Mood and affect are normal. Speech and behavior are normal.  ____________________________________________   LABS (all labs ordered are listed, but only abnormal results are displayed)  Labs Reviewed  GLUCOSE, CAPILLARY - Abnormal; Notable for the following:    Glucose-Capillary 246 (*)    All other components within normal limits  BASIC METABOLIC PANEL - Abnormal; Notable for the following:    Glucose, Bld 267 (*)    BUN 102 (*)    Creatinine, Ser 4.82 (*)    GFR calc non Af Amer 13 (*)    GFR calc Af Amer 15 (*)    All other components within normal limits  CBC - Abnormal; Notable for the following:    RBC 3.08 (*)    Hemoglobin 7.9 (*)    HCT 24.9 (*)    MCH 25.7 (*)    MCHC 31.8 (*)    RDW 17.1 (*)    All other components within normal limits  PROTIME-INR - Abnormal; Notable for the following:    Prothrombin Time 15.7 (*)    All other components within normal limits  URINALYSIS COMPLETEWITH MICROSCOPIC ([facility] ONLY)  CBG MONITORING, ED   ____________________________________________  EKG  ED ECG REPORT I, Kasen Adduci, the attending physician, personally viewed and interpreted this ECG.   Date: 02/27/2016  EKG Time: 23:56  Rate: 79  Rhythm: atrial fibrillation, rate 79  Axis: Normal  Intervals:RBBB and LPFB  ST&T Change: Non-specific ST segment / T-wave changes, but no evidence of acute ischemia.   ____________________________________________  RADIOLOGY   No results  found.  ____________________________________________   PROCEDURES  Procedure(s) performed: None  Critical Care performed: No ____________________________________________   INITIAL IMPRESSION / ASSESSMENT AND PLAN / ED COURSE  Pertinent labs & imaging results that were available during my care of the patient were reviewed by me and considered in my medical decision making (see chart for details).  Although the patient is not substantially hyperglycemic even though he has increased over his baseline, of greater concern is his significantly elevated BUN and creatinine, more than 100 and the BUN and a creatinine of nearly 5, significantly increased over his apparent baseline of 2.4.  I started IV fluids and discussed these results with him.  I suspect that his increased dose of Lasix is the cause.  Of note he has also subtherapeutic on his warfarin with an INR of less than 1.3.  He also has a low hemoglobin which may be contributing to his symptoms as well.  I discussed the case with the hospitalist who will admit.   ____________________________________________  FINAL CLINICAL IMPRESSION(S) / ED DIAGNOSES  Final diagnoses:  Acute on chronic renal failure (HCC)  Insulin dependent diabetes mellitus (HCC)  Warfarin anticoagulation  Anemia, unspecified anemia type     MEDICATIONS GIVEN DURING THIS VISIT:  Medications  sodium chloride 0.9 % bolus 1,000 mL (1,000 mLs Intravenous New Bag/Given 02/27/16 2350)     NEW OUTPATIENT MEDICATIONS STARTED DURING THIS VISIT:  New Prescriptions   No medications on file      Note:  This document was prepared using Dragon voice recognition software and may include unintentional dictation errors.   Loleta Rose, MD 02/28/16 [phone]

## 2016-02-27 NOTE — ED Notes (Signed)
Pt states has had hyperglycemia at home of over 400. Pt states took 30 units of insulin at 2210 prior to coming to ed. Pt states has had increased thirst and blurred vision. Pt denies pain, vomiting.

## 2016-02-28 ENCOUNTER — Encounter: Payer: Self-pay | Admitting: *Deleted

## 2016-02-28 DIAGNOSIS — I129 Hypertensive chronic kidney disease with stage 1 through stage 4 chronic kidney disease, or unspecified chronic kidney disease: Secondary | ICD-10-CM | POA: Diagnosis present

## 2016-02-28 DIAGNOSIS — D631 Anemia in chronic kidney disease: Secondary | ICD-10-CM | POA: Diagnosis present

## 2016-02-28 DIAGNOSIS — N183 Chronic kidney disease, stage 3 (moderate): Secondary | ICD-10-CM | POA: Diagnosis present

## 2016-02-28 DIAGNOSIS — Z800 Family history of malignant neoplasm of digestive organs: Secondary | ICD-10-CM | POA: Diagnosis not present

## 2016-02-28 DIAGNOSIS — N170 Acute kidney failure with tubular necrosis: Secondary | ICD-10-CM | POA: Diagnosis present

## 2016-02-28 DIAGNOSIS — R600 Localized edema: Secondary | ICD-10-CM | POA: Diagnosis present

## 2016-02-28 DIAGNOSIS — I2510 Atherosclerotic heart disease of native coronary artery without angina pectoris: Secondary | ICD-10-CM | POA: Diagnosis present

## 2016-02-28 DIAGNOSIS — Z803 Family history of malignant neoplasm of breast: Secondary | ICD-10-CM | POA: Diagnosis not present

## 2016-02-28 DIAGNOSIS — E860 Dehydration: Secondary | ICD-10-CM | POA: Diagnosis present

## 2016-02-28 DIAGNOSIS — Z882 Allergy status to sulfonamides status: Secondary | ICD-10-CM | POA: Diagnosis not present

## 2016-02-28 DIAGNOSIS — N189 Chronic kidney disease, unspecified: Secondary | ICD-10-CM

## 2016-02-28 DIAGNOSIS — Z86718 Personal history of other venous thrombosis and embolism: Secondary | ICD-10-CM | POA: Diagnosis not present

## 2016-02-28 DIAGNOSIS — Z801 Family history of malignant neoplasm of trachea, bronchus and lung: Secondary | ICD-10-CM | POA: Diagnosis not present

## 2016-02-28 DIAGNOSIS — R809 Proteinuria, unspecified: Secondary | ICD-10-CM | POA: Diagnosis present

## 2016-02-28 DIAGNOSIS — N179 Acute kidney failure, unspecified: Secondary | ICD-10-CM | POA: Diagnosis present

## 2016-02-28 DIAGNOSIS — E1122 Type 2 diabetes mellitus with diabetic chronic kidney disease: Secondary | ICD-10-CM | POA: Diagnosis present

## 2016-02-28 DIAGNOSIS — I252 Old myocardial infarction: Secondary | ICD-10-CM | POA: Diagnosis not present

## 2016-02-28 DIAGNOSIS — E1142 Type 2 diabetes mellitus with diabetic polyneuropathy: Secondary | ICD-10-CM | POA: Diagnosis present

## 2016-02-28 DIAGNOSIS — Z7901 Long term (current) use of anticoagulants: Secondary | ICD-10-CM | POA: Diagnosis not present

## 2016-02-28 DIAGNOSIS — K219 Gastro-esophageal reflux disease without esophagitis: Secondary | ICD-10-CM | POA: Diagnosis present

## 2016-02-28 DIAGNOSIS — T502X5A Adverse effect of carbonic-anhydrase inhibitors, benzothiadiazides and other diuretics, initial encounter: Secondary | ICD-10-CM | POA: Diagnosis present

## 2016-02-28 DIAGNOSIS — Z8249 Family history of ischemic heart disease and other diseases of the circulatory system: Secondary | ICD-10-CM | POA: Diagnosis not present

## 2016-02-28 DIAGNOSIS — E785 Hyperlipidemia, unspecified: Secondary | ICD-10-CM | POA: Diagnosis present

## 2016-02-28 DIAGNOSIS — E1165 Type 2 diabetes mellitus with hyperglycemia: Secondary | ICD-10-CM | POA: Diagnosis present

## 2016-02-28 DIAGNOSIS — Z794 Long term (current) use of insulin: Secondary | ICD-10-CM | POA: Diagnosis not present

## 2016-02-28 DIAGNOSIS — Z79899 Other long term (current) drug therapy: Secondary | ICD-10-CM | POA: Diagnosis not present

## 2016-02-28 LAB — URINALYSIS COMPLETE WITH MICROSCOPIC (ARMC ONLY)
Bilirubin Urine: NEGATIVE
GLUCOSE, UA: 50 mg/dL — AB
HGB URINE DIPSTICK: NEGATIVE
KETONES UR: NEGATIVE mg/dL
LEUKOCYTES UA: NEGATIVE
Nitrite: NEGATIVE
Protein, ur: 100 mg/dL — AB
SPECIFIC GRAVITY, URINE: 1.009 (ref 1.005–1.030)
SQUAMOUS EPITHELIAL / LPF: NONE SEEN
pH: 5 (ref 5.0–8.0)

## 2016-02-28 LAB — GLUCOSE, CAPILLARY
Glucose-Capillary: 177 mg/dL — ABNORMAL HIGH (ref 65–99)
Glucose-Capillary: 197 mg/dL — ABNORMAL HIGH (ref 65–99)
Glucose-Capillary: 142 mg/dL — ABNORMAL HIGH (ref 65–99)
Glucose-Capillary: 88 mg/dL (ref 65–99)
Glucose-Capillary: 145 mg/dL — ABNORMAL HIGH (ref 65–99)

## 2016-02-28 LAB — TSH: TSH: <0.01 u[IU]/mL — ABNORMAL LOW (ref 0.350–4.500)

## 2016-02-28 LAB — PROTIME-INR
INR: 1.23
PROTHROMBIN TIME: 15.7 s — AB (ref 11.4–15.0)

## 2016-02-28 LAB — HEMOGLOBIN A1C: Hgb A1c MFr Bld: 7.9 % — ABNORMAL HIGH (ref 4.0–6.0)

## 2016-02-28 LAB — MRSA PCR SCREENING: MRSA by PCR: POSITIVE — AB

## 2016-02-28 MED ORDER — HEPARIN SODIUM (PORCINE) 5000 UNIT/ML IJ SOLN
5000.0000 [IU] | Freq: Three times a day (TID) | INTRAMUSCULAR | Status: DC
Start: 2016-02-28 — End: 2016-03-01
  Administered 2016-02-28 – 2016-03-01 (×7): 5000 [IU] via SUBCUTANEOUS
  Filled 2016-02-28 (×7): qty 1

## 2016-02-28 MED ORDER — CARVEDILOL 6.25 MG PO TABS
6.2500 mg | ORAL_TABLET | Freq: Two times a day (BID) | ORAL | Status: DC
  Administered 2016-02-28 – 2016-03-01 (×5): 6.25 mg via ORAL
  Filled 2016-02-28 (×5): qty 1

## 2016-02-28 MED ORDER — ATORVASTATIN CALCIUM 20 MG PO TABS
80.0000 mg | ORAL_TABLET | Freq: Every day | ORAL | Status: DC
  Administered 2016-02-28 – 2016-02-29 (×2): 80 mg via ORAL
  Filled 2016-02-28 (×2): qty 4

## 2016-02-28 MED ORDER — CHLORHEXIDINE GLUCONATE CLOTH 2 % EX PADS
6.0000 | MEDICATED_PAD | Freq: Every day | CUTANEOUS | Status: DC
  Administered 2016-02-28 – 2016-03-01 (×2): 6 via TOPICAL

## 2016-02-28 MED ORDER — EPOETIN ALFA 20000 UNIT/ML IJ SOLN
20000.0000 [IU] | Freq: Once | INTRAMUSCULAR | Status: DC
  Filled 2016-02-28: qty 1

## 2016-02-28 MED ORDER — INSULIN DETEMIR 100 UNIT/ML ~~LOC~~ SOLN
18.0000 [IU] | Freq: Every day | SUBCUTANEOUS | Status: DC
  Administered 2016-02-28 – 2016-02-29 (×3): 18 [IU] via SUBCUTANEOUS
  Filled 2016-02-28 (×4): qty 0.18

## 2016-02-28 MED ORDER — PNEUMOCOCCAL VAC POLYVALENT 25 MCG/0.5ML IJ INJ
0.5000 mL | INJECTION | INTRAMUSCULAR | Status: DC
Start: 2016-02-29 — End: 2016-02-28

## 2016-02-28 MED ORDER — OMEGA-3-ACID ETHYL ESTERS 1 G PO CAPS
1.0000 g | ORAL_CAPSULE | Freq: Every day | ORAL | Status: DC
  Administered 2016-02-28 – 2016-03-01 (×3): 1 g via ORAL
  Filled 2016-02-28 (×3): qty 1

## 2016-02-28 MED ORDER — ACETAMINOPHEN 325 MG PO TABS
650.0000 mg | ORAL_TABLET | Freq: Four times a day (QID) | ORAL | Status: DC | PRN
Start: 2016-02-28 — End: 2016-03-01
  Administered 2016-02-29: 650 mg via ORAL
  Filled 2016-02-28: qty 2

## 2016-02-28 MED ORDER — SODIUM CHLORIDE 0.9 % IV SOLN
INTRAVENOUS | Status: DC
  Administered 2016-02-28 – 2016-03-01 (×5): via INTRAVENOUS

## 2016-02-28 MED ORDER — SENNA 8.6 MG PO TABS
1.0000 | ORAL_TABLET | Freq: Every day | ORAL | Status: DC
  Filled 2016-02-28 (×3): qty 1

## 2016-02-28 MED ORDER — METOLAZONE 5 MG PO TABS
5.0000 mg | ORAL_TABLET | ORAL | Status: DC
  Administered 2016-02-28: 5 mg via ORAL
  Filled 2016-02-28: qty 1

## 2016-02-28 MED ORDER — EPOETIN ALFA 10000 UNIT/ML IJ SOLN
20000.0000 [IU] | Freq: Once | INTRAMUSCULAR | Status: AC
  Administered 2016-02-28: 20000 [IU] via SUBCUTANEOUS

## 2016-02-28 MED ORDER — INSULIN ASPART 100 UNIT/ML ~~LOC~~ SOLN
0.0000 [IU] | Freq: Three times a day (TID) | SUBCUTANEOUS | Status: DC
  Administered 2016-02-28: 3 [IU] via SUBCUTANEOUS
  Administered 2016-02-28: 2 [IU] via SUBCUTANEOUS
  Administered 2016-02-29 (×2): 5 [IU] via SUBCUTANEOUS
  Administered 2016-03-01: 8 [IU] via SUBCUTANEOUS
  Filled 2016-02-28 (×2): qty 5
  Filled 2016-02-28: qty 3
  Filled 2016-02-28: qty 8
  Filled 2016-02-28: qty 2

## 2016-02-28 MED ORDER — ACETAMINOPHEN 650 MG RE SUPP: 650.0000 mg | Freq: Four times a day (QID) | RECTAL | Status: DC | PRN

## 2016-02-28 MED ORDER — FUROSEMIDE 40 MG PO TABS
40.0000 mg | ORAL_TABLET | Freq: Two times a day (BID) | ORAL | Status: DC
  Administered 2016-02-28: 40 mg via ORAL
  Filled 2016-02-28: qty 1

## 2016-02-28 MED ORDER — CLONIDINE HCL 0.1 MG PO TABS
0.2000 mg | ORAL_TABLET | Freq: Three times a day (TID) | ORAL | Status: DC
  Administered 2016-02-28 – 2016-03-01 (×7): 0.2 mg via ORAL
  Filled 2016-02-28 (×7): qty 2

## 2016-02-28 MED ORDER — MUPIROCIN 2 % EX OINT
1.0000 "application " | TOPICAL_OINTMENT | Freq: Two times a day (BID) | CUTANEOUS | Status: DC
  Administered 2016-02-28 – 2016-03-01 (×5): 1 via NASAL
  Filled 2016-02-28: qty 22

## 2016-02-28 MED ORDER — ONDANSETRON HCL 4 MG PO TABS: 4.0000 mg | ORAL_TABLET | Freq: Four times a day (QID) | ORAL | Status: DC | PRN

## 2016-02-28 MED ORDER — MORPHINE SULFATE (PF) 2 MG/ML IV SOLN: 2.0000 mg | INTRAVENOUS | Status: DC | PRN

## 2016-02-28 MED ORDER — CLONIDINE HCL 0.1 MG PO TABS: 0.1000 mg | ORAL_TABLET | Freq: Two times a day (BID) | ORAL | Status: DC

## 2016-02-28 MED ORDER — ONDANSETRON HCL 4 MG/2ML IJ SOLN: 4.0000 mg | Freq: Four times a day (QID) | INTRAMUSCULAR | Status: DC | PRN

## 2016-02-28 MED ORDER — LOSARTAN POTASSIUM 25 MG PO TABS
25.0000 mg | ORAL_TABLET | Freq: Every day | ORAL | Status: DC
  Administered 2016-02-28: 25 mg via ORAL
  Filled 2016-02-28: qty 1

## 2016-02-28 NOTE — Progress Notes (Signed)
Admitted this morning for hyperglycemia and acute on chronic renal failure.  On IV hydration,overall feels better.watch closley, hold  Lasix.,check BMP am.  Hylerglycemia improved. likley d/c am Reviewed the note, labs, examined the patient.

## 2016-02-28 NOTE — H&P (Signed)
Juan Roberson. is an 45 y.o. male.   Chief Complaint: Blurred vision HPI: The patient with past medical history of chronic kidney disease and diabetes presents emergency department due to blurred vision. The patient knows that this is a symptom of hyperglycemia and found his sugar to be greater than 400 at home. In the emergency department sugar was confirmed to be elevated. Further laboratory evaluation revealed acute on chronic kidney disease. The patient denies chest pain, shortness of breath, nausea, or vomiting. The patient admits to 1 episode of loose stool this afternoon. He denies pain anywhere. Due to his hyperglycemia as well as acute on chronic kidney injury the emergency department staff called for admission.  Past Medical History  Diagnosis Date  . Diabetes mellitus without complication ([place])     a. Dx ~ 1996.  . Essential hypertension   . CKD (chronic kidney disease), stage III   . GERD (gastroesophageal reflux disease)   . Cellulitis and abscess of foot     Left-Dr. Vickki Muff  . Osteomyelitis ([place])     a. 05/2015 L foot.  . Left leg DVT ([place])     a. Dx 05/2015 -> Coumadin.  . Gastritis     a. 04/2015 hematemesis -> EGD: gastritis, esophagitis, duodenitis.  No active bleeding.  PPI added.  . MI (myocardial infarction) [facility])     Past Surgical History  Procedure Laterality Date  . I&d extremity Left 04/06/2015    Procedure: IRRIGATION AND DEBRIDEMENT EXTREMITY;  Surgeon: Samara Deist, DPM;  Location: [facility] ORS;  Service: Podiatry;  Laterality: Left;  . Esophagogastroduodenoscopy N/A 04/07/2015    Procedure: ESOPHAGOGASTRODUODENOSCOPY (EGD);  Surgeon: Lucilla Lame, MD;  Location: [facility] ENDOSCOPY;  Service: Endoscopy;  Laterality: N/A;  . Foot surgery    . Irrigation and debridement foot Left 05/21/2015    Procedure: IRRIGATION AND DEBRIDEMENT FOOT;  Surgeon: Sharlotte Alamo, MD;  Location: [facility] ORS;  Service: Podiatry;  Laterality: Left;  . Esophagogastroduodenoscopy (egd) with propofol  Left 06/30/2015    Procedure: ESOPHAGOGASTRODUODENOSCOPY (EGD) WITH PROPOFOL;  Surgeon: Lollie Sails, MD;  Location: [facility] ENDOSCOPY;  Service: Endoscopy;  Laterality: Left;    Family History  Problem Relation Age of Onset  . Coronary artery disease Father   . Pancreatic cancer Mother   . Breast cancer Sister   . Lung cancer Brother   . Pancreatic cancer Mother    Social History:  reports that he has never smoked. He has never used smokeless tobacco. He reports that he does not drink alcohol or use illicit drugs.  Allergies:  Allergies  Allergen Reactions  . Sulfur Other (See Comments)    Pt states that this medication causes renal failure.      Prior to Admission medications   Medication Sig Start Date End Date Taking? Authorizing Provider  atorvastatin (LIPITOR) 80 MG tablet Take 1 tablet (80 mg total) by mouth daily at 6 PM. 11/03/15  Yes Srikar Sudini, MD  B-D ULTRAFINE III SHORT PEN 31G X 8 MM MISC Inject 1 pen into the skin daily. 01/10/16  Yes Historical Provider, MD  carvedilol (COREG) 6.25 MG tablet Take 1 tablet by mouth 2 (two) times daily. 11/19/15  Yes Historical Provider, MD  cloNIDine (CATAPRES) 0.2 MG tablet Take 1 tablet (0.2 mg total) by mouth 3 (three) times daily. 11/03/15  Yes Srikar Sudini, MD  furosemide (LASIX) 40 MG tablet Take 1 tablet (40 mg total) by mouth 2 (two) times daily. 11/03/15  Yes Hillary Bow, MD  losartan (COZAAR) 25 MG tablet Take 25 mg by mouth daily. 11/22/15  Yes Historical Provider, MD  metolazone (ZAROXOLYN) 5 MG tablet Take 1 tablet by mouth every other day. 02/09/16  Yes Historical Provider, MD  omega-3 fish oil (MAXEPA) 1000 MG CAPS capsule Take 1 capsule by mouth daily.   Yes Historical Provider, MD  senna (SENOKOT) 8.6 MG TABS tablet Take 1 tablet by mouth daily.   Yes Historical Provider, MD  TRESIBA FLEXTOUCH 200 UNIT/ML SOPN Inject 25 Units into the skin daily.    Yes Historical Provider, MD     Results for orders placed or performed  during the hospital encounter of 02/27/16 (from the past 48 hour(s))  Basic metabolic panel     Status: Abnormal   Collection Time: 02/27/16 10:35 PM  Result Value Ref Range   Sodium 136 135 - 145 mmol/L   Potassium 4.6 3.5 - 5.1 mmol/L   Chloride 101 101 - 111 mmol/L   CO2 23 22 - 32 mmol/L   Glucose, Bld 267 (H) 65 - 99 mg/dL   BUN 102 (H) 6 - 20 mg/dL    Comment: RESULTS CONFIRMED BY MANUAL DILUTION   Creatinine, Ser 4.82 (H) 0.61 - 1.24 mg/dL   Calcium 9.0 8.9 - 10.3 mg/dL   GFR calc non Af Amer 13 (L) >60 mL/min   GFR calc Af Amer 15 (L) >60 mL/min    Comment: (NOTE) The eGFR has been calculated using the CKD EPI equation. This calculation has not been validated in all clinical situations. eGFR's persistently <60 mL/min signify possible Chronic Kidney Disease.    Anion gap 12 5 - 15  CBC     Status: Abnormal   Collection Time: 02/27/16 10:35 PM  Result Value Ref Range   WBC 4.8 3.8 - 10.6 K/uL   RBC 3.08 (L) 4.40 - 5.90 MIL/uL   Hemoglobin 7.9 (L) 13.0 - 18.0 g/dL   HCT 24.9 (L) 40.0 - 52.0 %   MCV 80.7 80.0 - 100.0 fL   MCH 25.7 (L) 26.0 - 34.0 pg   MCHC 31.8 (L) 32.0 - 36.0 g/dL   RDW 17.1 (H) 11.5 - 14.5 %   Platelets 160 150 - 440 K/uL  Glucose, capillary     Status: Abnormal   Collection Time: 02/27/16 10:45 PM  Result Value Ref Range   Glucose-Capillary 246 (H) 65 - 99 mg/dL  Protime-INR     Status: Abnormal   Collection Time: 02/28/16 12:05 AM  Result Value Ref Range   Prothrombin Time 15.7 (H) 11.4 - 15.0 seconds   INR 1.23    No results found.  Review of Systems  Constitutional: Negative for fever and chills.  HENT: Negative for sore throat and tinnitus.   Eyes: Positive for blurred vision. Negative for redness.  Respiratory: Negative for cough and shortness of breath.   Cardiovascular: Negative for chest pain, palpitations, orthopnea and PND.  Gastrointestinal: Positive for diarrhea (x1). Negative for nausea, vomiting and abdominal pain.   Genitourinary: Negative for dysuria, urgency and frequency.  Musculoskeletal: Negative for myalgias and joint pain.  Skin: Negative for rash.       No lesions  Neurological: Negative for speech change, focal weakness and weakness.  Endo/Heme/Allergies: Does not bruise/bleed easily.       No temperature intolerance  Psychiatric/Behavioral: Negative for depression and suicidal ideas.    Blood pressure 122/65, pulse 82, temperature 97.8 F (36.6 C), temperature source Oral, resp. rate 16, height _0  (1.88 m),  weight 83.008 kg (183 lb), SpO2 98 %. Physical Exam  Nursing note and vitals reviewed. Constitutional: He is oriented to person, place, and time. He appears well-developed and well-nourished. No distress.  HENT:  Head: Normocephalic and atraumatic.  Mouth/Throat: Oropharynx is clear and moist. No oropharyngeal exudate.  Eyes: Conjunctivae and EOM are normal. Pupils are equal, round, and reactive to light. No scleral icterus.  Neck: Normal range of motion. Neck supple. No JVD present. No tracheal deviation present. No thyromegaly present.  Cardiovascular: Normal rate, regular rhythm and normal heart sounds.  Exam reveals no gallop and no friction rub.   No murmur heard. Respiratory: Effort normal and breath sounds normal. No respiratory distress. He has no wheezes.  GI: Soft. Bowel sounds are normal. He exhibits no distension. There is no tenderness.  Genitourinary:  Deferred  Musculoskeletal: Normal range of motion. He exhibits no edema.  Lymphadenopathy:    He has no cervical adenopathy.  Neurological: He is alert and oriented to person, place, and time. No cranial nerve deficit.  Skin: Skin is warm and dry. No rash noted. No erythema.  Psychiatric: He has a normal mood and affect. His behavior is normal. Judgment and thought content normal.     Assessment/Plan This is a 45 year old male admitted for acute on chronic kidney injury and hyperglycemia. 1. Acute on chronic  kidney injury: The patient's primary care doctor reportedly doubled his usual dose of Lasix 40 mg twice a day. This produced relative dehydration which has resulted in prerenal injury. We will hydrate the patient and place him back on his home regimen of diuretic (including metolazone) which should return him to baseline. 2. Hyperglycemia: Secondary to diabetes mellitus; sliding scale insulin while hospitalized area 3. Diabetes mellitus type 2: Continue basal insulin. ADA diet. 4. Coronary artery disease: Stable. Continue carvedilol 5. Anemia: Likely secondary to chronic kidney disease. The patient also had been on warfarin for blood clots in his lower extremity which was discontinued last month. Defer to nephrology for iron supplementation versus blood transfusion. 6. Hypertension: Continue valsartan and clonidine 7. Hyperlipidemia: Continue statin therapy 8. DVT prophylaxis: Heparin 9. GI prophylaxis: None The patient is a full code. Time spent on admission orders and patient care approximately 45 minutes  Harrie Foreman, MD 02/28/2016, 1:27 AM

## 2016-02-28 NOTE — Progress Notes (Signed)
[facility]  ROUNDING NOTE   Subjective:  Patient very well known to [place]. We follow him for underlying chronic kidney disease stage III however he has fluctuating creatinine. Over the past several months the patient's creatinine has fluctuated between 1.92.7. Recently he had significant volume overload therefore he was started on Lasix 80 mg in the a.m. and 40 mg in the PM. Was also on metolazone 2-3 times per week. His weight was up to 210 pounds but is now down to 191.   Objective:  Vital signs in last 24 hours:  Temp:  [97.6 F (36.4 C)-98.3 F (36.8 C)] 98.3 F (36.8 C) (05/29 1323) Pulse Rate:  [75-87] 87 (05/29 1323) Resp:  [16-23] 16 (05/29 1323) BP: (109-162)/(47-70) 162/68 mmHg (05/29 1323) SpO2:  [98 %-100 %] 100 % (05/29 1323) Weight:  [83.008 kg (183 lb)-86.637 kg (191 lb)] 86.637 kg (191 lb) (05/29 0140)  Weight change:  Filed Weights   02/27/16 2242 02/28/16 0140  Weight: 83.008 kg (183 lb) 86.637 kg (191 lb)    Intake/Output: I/O last 3 completed shifts: In: 0  Out: 950 [Urine:950]   Intake/Output this shift:     Physical Exam: General: NAD, resting in bed  Head: Normocephalic, atraumatic. Moist oral mucosal membranes  Eyes: Anicteric  Neck: Supple, trachea midline  Lungs:  Clear to auscultation, normal effort  Heart: Regular rate and rhythm  Abdomen:  Soft, nontender, BS present  Extremities: trace peripheral edema.  Neurologic: Nonfocal, moving all four extremities  Skin: No lesions       Basic Metabolic Panel:  Recent Labs Lab 02/27/16 2235  NA 136  K 4.6  CL 101  CO2 23  GLUCOSE 267*  BUN 102*  CREATININE 4.82*  CALCIUM 9.0    Liver Function Tests: No results for input(s): AST, ALT, ALKPHOS, BILITOT, PROT, ALBUMIN in the last 168 hours. No results for input(s): LIPASE, AMYLASE in the last 168 hours. No results for input(s): AMMONIA in the last 168 hours.  CBC:  Recent Labs Lab 02/27/16 2235  WBC 4.8  HGB  7.9*  HCT 24.9*  MCV 80.7  PLT 160    Cardiac Enzymes: No results for input(s): CKTOTAL, CKMB, CKMBINDEX, TROPONINI in the last 168 hours.  BNP: Invalid input(s): POCBNP  CBG:  Recent Labs Lab 02/27/16 2245 02/28/16 0143 02/28/16 0755 02/28/16 1124  GLUCAP 246* 177* 197* 142*    Microbiology: Results for orders placed or performed during the hospital encounter of 02/27/16  MRSA PCR Screening     Status: Abnormal   Collection Time: 02/28/16  4:28 AM  Result Value Ref Range Status   MRSA by PCR POSITIVE (A) NEGATIVE Final    Comment:        The GeneXpert MRSA Assay (FDA approved for NASAL specimens only), is one component of a comprehensive MRSA colonization surveillance program. It is not intended to diagnose MRSA infection nor to guide or monitor treatment for MRSA infections. CRITICAL RESULT CALLED TO, READ BACK BY AND VERIFIED WITH: Liz Malady AT [id number] 02/28/16.PMH     Coagulation Studies:  Recent Labs  02/28/16 0005  LABPROT 15.7*  INR 1.23    Urinalysis:  Recent Labs  02/28/16 0113  COLORURINE YELLOW*  LABSPEC 1.009  PHURINE 5.0  GLUCOSEU 50*  HGBUR NEGATIVE  BILIRUBINUR NEGATIVE  KETONESUR NEGATIVE  PROTEINUR 100*  NITRITE NEGATIVE  LEUKOCYTESUR NEGATIVE      Imaging: No results found.   Medications:   . sodium chloride 125 mL/hr at  02/28/16 [id number]   . atorvastatin  80 mg Oral q1800  . carvedilol  6.25 mg Oral BID WC  . Chlorhexidine Gluconate Cloth  6 each Topical Q0600  . cloNIDine  0.2 mg Oral TID  . heparin  5,000 Units Subcutaneous Q8H  . insulin aspart  0-15 Units Subcutaneous TID WC  . insulin detemir  18 Units Subcutaneous QHS  . losartan  25 mg Oral Daily  . metolazone  5 mg Oral QODAY  . mupirocin ointment  1 application Nasal BID  . omega-3 acid ethyl esters  1 g Oral Daily  . senna  1 tablet Oral Daily   acetaminophen **OR** acetaminophen, morphine injection, ondansetron **OR** ondansetron (ZOFRAN)  IV  Assessment/ Plan:  45 y.o. male with past medical history of hypertension, diabetes mellitus type 2, chronic kidney disease stage III, peripheral neuropathy  1. Acute renal failure/Chronic kidney disease stage III/diabetes mellitus type 2 with chronic kidney disease/proteinuria. Baseline Cr fluctuating between 1.9-2.7 as outpt. The patient has difficult to manage chronic kidney disease given his lower extremity edema. He is very sensitive to fluctuations in renal function with diuretics.  -  Clearly now he appears to be volume depleted as his lower extremity edema is much less than what we had evaluated several weeks ago. Lasix was discontinued however he was apparently continued on metolazone which we will now stop. We will also hold losartan at this time. Obtain renal ultrasound to make sure there is no underlying obstruction. Continue IV fluid hydration. If this does not improve his renal function significantly we may need to consider temporary renal replacement therapy.   2. Hypertension. Blood pressure currently 162/68. Continue Coreg. Discontinue losartan given acute renal failure. He was also on clonidine in the office which we will restart.   3. Anemia chronic kidney disease. Hemoglobin down to 7.9. Administer Epogen 20,000 units subcutaneous 1 now. We advised him to see hematology as an outpatient but unclear as to whether he's had this appointment thus far.   4. Lower extremity edema.  Significantly improved as compared to most office visit. We have discontinued Lasix and metolazone. Monitor clinically.    LOS: 0 Rufus Beske 5/29/20173:21 PM

## 2016-02-28 NOTE — Care Management (Signed)
Chronic kidney disease now  significant exacerbation.  Nephrotoxins on hold.  Lasix on hold and gentle hydration. Nephrology involved.  At present, there has been no discharge needs identified by members of the care team

## 2016-02-29 ENCOUNTER — Inpatient Hospital Stay: Payer: BLUE CROSS/BLUE SHIELD

## 2016-02-29 LAB — GLUCOSE, CAPILLARY
Glucose-Capillary: 83 mg/dL (ref 65–99)
Glucose-Capillary: 231 mg/dL — ABNORMAL HIGH (ref 65–99)
Glucose-Capillary: 203 mg/dL — ABNORMAL HIGH (ref 65–99)
Glucose-Capillary: 118 mg/dL — ABNORMAL HIGH (ref 65–99)

## 2016-02-29 LAB — BASIC METABOLIC PANEL
Anion gap: 8 (ref 5–15)
BUN: 82 mg/dL — ABNORMAL HIGH (ref 6–20)
CALCIUM: 8.8 mg/dL — AB (ref 8.9–10.3)
CHLORIDE: 110 mmol/L (ref 101–111)
CO2: 23 mmol/L (ref 22–32)
CREATININE: 3.63 mg/dL — AB (ref 0.61–1.24)
GFR, EST AFRICAN AMERICAN: 22 mL/min — AB (ref >60)
GFR, EST NON AFRICAN AMERICAN: 19 mL/min — AB (ref >60)
Glucose, Bld: 164 mg/dL — ABNORMAL HIGH (ref 65–99)
Potassium: 4.4 mmol/L (ref 3.5–5.1)
SODIUM: 141 mmol/L (ref 135–145)

## 2016-02-29 NOTE — Progress Notes (Signed)
[facility]  ROUNDING NOTE   Subjective:   Wife at bedside.   UOP 5350  NS at 17725mL/hr  Creatinine 3.63 (4.82)   Objective:  Vital signs in last 24 hours:  Temp:  [98 F (36.7 C)-98.3 F (36.8 C)] 98 F (36.7 C) (05/30 0434) Pulse Rate:  [84-91] 84 (05/30 0434) Resp:  [17-18] 17 (05/30 0434) BP: (136-173)/(61-88) 146/78 mmHg (05/30 0434) SpO2:  [99 %-100 %] 100 % (05/30 0434) Weight:  [87.454 kg (192 lb 12.8 oz)] 87.454 kg (192 lb 12.8 oz) (05/30 0500)  Weight change: 4.445 kg (9 lb 12.8 oz) Filed Weights   02/27/16 2242 02/28/16 0140 02/29/16 0500  Weight: 83.008 kg (183 lb) 86.637 kg (191 lb) 87.454 kg (192 lb 12.8 oz)    Intake/Output: I/O last 3 completed shifts: In: 3622 [P.O.:360; I.V.:3262] Out: 6300 [Urine:6300]   Intake/Output this shift:  Total I/O In: 729.2 [I.V.:729.2] Out: -   Physical Exam: General: NAD, resting in bed  Head: Normocephalic, atraumatic. Moist oral mucosal membranes  Eyes: Anicteric  Neck: Supple, trachea midline  Lungs:  Clear to auscultation, normal effort  Heart: Regular rate and rhythm  Abdomen:  Soft, nontender, BS present  Extremities: trace peripheral edema.  Neurologic: Nonfocal, moving all four extremities  Skin: No lesions       Basic Metabolic Panel:  Recent Labs Lab 02/27/16 2235 02/29/16 0414  NA 136 141  K 4.6 4.4  CL 101 110  CO2 23 23  GLUCOSE 267* 164*  BUN 102* 82*  CREATININE 4.82* 3.63*  CALCIUM 9.0 8.8*    Liver Function Tests: No results for input(s): AST, ALT, ALKPHOS, BILITOT, PROT, ALBUMIN in the last 168 hours. No results for input(s): LIPASE, AMYLASE in the last 168 hours. No results for input(s): AMMONIA in the last 168 hours.  CBC:  Recent Labs Lab 02/27/16 2235  WBC 4.8  HGB 7.9*  HCT 24.9*  MCV 80.7  PLT 160    Cardiac Enzymes: No results for input(s): CKTOTAL, CKMB, CKMBINDEX, TROPONINI in the last 168 hours.  BNP: Invalid input(s):  POCBNP  CBG:  Recent Labs Lab 02/28/16 1124 02/28/16 1625 02/28/16 2154 02/29/16 0746 02/29/16 1156  GLUCAP 142* 88 145* 83 231*    Microbiology: Results for orders placed or performed during the hospital encounter of 02/27/16  MRSA PCR Screening     Status: Abnormal   Collection Time: 02/28/16  4:28 AM  Result Value Ref Range Status   MRSA by PCR POSITIVE (A) NEGATIVE Final    Comment:        The GeneXpert MRSA Assay (FDA approved for NASAL specimens only), is one component of a comprehensive MRSA colonization surveillance program. It is not intended to diagnose MRSA infection nor to guide or monitor treatment for MRSA infections. CRITICAL RESULT CALLED TO, READ BACK BY AND VERIFIED WITH: Liz MaladyRISHA KING AT [id number] 02/28/16.PMH     Coagulation Studies:  Recent Labs  02/28/16 0005  LABPROT 15.7*  INR 1.23    Urinalysis:  Recent Labs  02/28/16 0113  COLORURINE YELLOW*  LABSPEC 1.009  PHURINE 5.0  GLUCOSEU 50*  HGBUR NEGATIVE  BILIRUBINUR NEGATIVE  KETONESUR NEGATIVE  PROTEINUR 100*  NITRITE NEGATIVE  LEUKOCYTESUR NEGATIVE      Imaging: [place] Renal  02/29/2016  CLINICAL DATA:  Acute renal failure. EXAM: RENAL / URINARY TRACT ULTRASOUND COMPLETE COMPARISON:  07/26/2014 FINDINGS: Right Kidney: Length: 10.2 cm. Echogenicity within normal limits. No mass or hydronephrosis visualized. No significant atrophy. Stable appearance  since the prior study. Left Kidney: Length: 11.0 cm. Echogenicity within normal limits. No mass or hydronephrosis visualized. No significant atrophy. Stable appearance since the prior study. Bladder: Appears normal for degree of bladder distention. IMPRESSION: Stable renal ultrasound. No evidence of hydronephrosis or significant renal atrophy. Electronically Signed   By: Irish Lack M.D.   On: 02/29/2016 08:34     Medications:   . sodium chloride 125 mL/hr at 02/29/16 0200   . atorvastatin  80 mg Oral q1800  . carvedilol  6.25 mg Oral  BID WC  . Chlorhexidine Gluconate Cloth  6 each Topical Q0600  . cloNIDine  0.2 mg Oral TID  . heparin  5,000 Units Subcutaneous Q8H  . insulin aspart  0-15 Units Subcutaneous TID WC  . insulin detemir  18 Units Subcutaneous QHS  . mupirocin ointment  1 application Nasal BID  . omega-3 acid ethyl esters  1 g Oral Daily  . senna  1 tablet Oral Daily   acetaminophen **OR** acetaminophen, morphine injection, ondansetron **OR** ondansetron (ZOFRAN) IV  Assessment/ Plan:  45 y.o. male with past medical history of hypertension, diabetes mellitus type 2, chronic kidney disease stage III, peripheral neuropathy  1. Acute renal failure on Chronic kidney disease stage III secondary to diabetic nephropathy with proteinuria.  - prerenal azotemia. Improved with holding fursoemide, metolazone and losartan. Continue IV fluids.   2. Hypertension.  -  Continue Coreg.  - Discontinue losartan given acute renal failure.   3. Anemia chronic kidney disease: epo given on 5/29   LOS: 1 Linton Stolp 5/30/20172:18 PM

## 2016-02-29 NOTE — Progress Notes (Signed)
[facility] - [place] at [facility]   PATIENT NAME: Juan Roberson    MR#:  [id number]  DATE OF BIRTH:  01/12/1971  SUBJECTIVE:admitted for renal failure.started  on IV fluids.now renal fuction is improving with fluids.  CHIEF COMPLAINT:   Chief Complaint  Patient presents with  . Hyperglycemia  . Blurred Vision    REVIEW OF SYSTEMS:   ROS CONSTITUTIONAL: No fever, fatigue or weakness.  EYES: No blurred or double vision.  EARS, NOSE, AND THROAT: No tinnitus or ear pain.  RESPIRATORY: No cough, shortness of breath, wheezing or hemoptysis.  CARDIOVASCULAR: No chest pain, orthopnea, edema.  GASTROINTESTINAL: No nausea, vomiting, diarrhea or abdominal pain.  GENITOURINARY: No dysuria, hematuria.  ENDOCRINE: No polyuria, nocturia,  HEMATOLOGY: No anemia, easy bruising or bleeding SKIN: No rash or lesion. MUSCULOSKELETAL: No joint pain or arthritis.   NEUROLOGIC: No tingling, numbness, weakness.  PSYCHIATRY: No anxiety or depression.   DRUG ALLERGIES:   Allergies  Allergen Reactions  . Sulfur Other (See Comments)    Pt states that this medication causes renal failure.      VITALS:  Blood pressure 144/69, pulse 85, temperature 98.1 F (36.7 C), temperature source Oral, resp. rate 18, height 6\' 2"  (1.88 m), weight 87.454 kg (192 lb 12.8 oz), SpO2 100 %.  PHYSICAL EXAMINATION:  GENERAL:  45 y.o.-year-old patient lying in the bed with no acute distress.  EYES: Pupils equal, round, reactive to light and accommodation. No scleral icterus. Extraocular muscles intact.  HEENT: Head atraumatic, normocephalic. Oropharynx and nasopharynx clear.  NECK:  Supple, no jugular venous distention. No thyroid enlargement, no tenderness.  LUNGS: Normal breath sounds bilaterally, no wheezing, rales,rhonchi or crepitation. No use of accessory muscles of respiration.  CARDIOVASCULAR: S1, S2 normal. No murmurs, rubs, or gallops.  ABDOMEN: Soft, nontender, nondistended. Bowel  sounds present. No organomegaly or mass.  EXTREMITIES: No pedal edema, cyanosis, or clubbing.  NEUROLOGIC: Cranial nerves II through XII are intact. Muscle strength 5/5 in all extremities. Sensation intact. Gait not checked.  PSYCHIATRIC: The patient is alert and oriented x 3.  SKIN: No obvious rash, lesion, or ulcer.    LABORATORY PANEL:   CBC  Recent Labs Lab 02/27/16 2235  WBC 4.8  HGB 7.9*  HCT 24.9*  PLT 160   ------------------------------------------------------------------------------------------------------------------  Chemistries   Recent Labs Lab 02/29/16 0414  NA 141  K 4.4  CL 110  CO2 23  GLUCOSE 164*  BUN 82*  CREATININE 3.63*  CALCIUM 8.8*   ------------------------------------------------------------------------------------------------------------------  Cardiac Enzymes No results for input(s): TROPONINI in the last 168 hours. ------------------------------------------------------------------------------------------------------------------  RADIOLOGY:  [place] Renal  02/29/2016  CLINICAL DATA:  Acute renal failure. EXAM: RENAL / URINARY TRACT ULTRASOUND COMPLETE COMPARISON:  07/26/2014 FINDINGS: Right Kidney: Length: 10.2 cm. Echogenicity within normal limits. No mass or hydronephrosis visualized. No significant atrophy. Stable appearance since the prior study. Left Kidney: Length: 11.0 cm. Echogenicity within normal limits. No mass or hydronephrosis visualized. No significant atrophy. Stable appearance since the prior study. Bladder: Appears normal for degree of bladder distention. IMPRESSION: Stable renal ultrasound. No evidence of hydronephrosis or significant renal atrophy. Electronically Signed   By: Irish LackGlenn  Yamagata M.D.   On: 02/29/2016 08:34    EKG:   Orders placed or performed during the hospital encounter of 02/27/16  . ED EKG  . ED EKG    ASSESSMENT AND PLAN:   1.acute on chronic renal failure;improving with iv fluids.ATN due to  diuretics. Continue to hod metzalone,loSARTAN. 2.HTN;CONTROLLED  MONITOR KIDNEY FUNCTION CLOSELY  All the records are reviewed and case discussed with Care Management/Social Workerr. Management plans discussed with the patient, family and they are in agreement.  CODE STATUS:  TOTAL TIME TAKING CARE OF THIS PATIENT:19minutes.   POSSIBLE D/C IN 1-2 DAYS, DEPENDING ON CLINICAL CONDITION.   Katha Hamming M.D on 02/29/2016 at 10:26 PM  Between 7am to 6pm - Pager - [phone]  After 6pm go to [email or web address] - password [email or web address]  [facility]  Office  [phone]  CC: Primary care physician; Corky Downs, MD   Note: This dictation was prepared with Dragon dictation along with smaller phrase technology. Any transcriptional errors that result from this process are unintentional.

## 2016-03-01 LAB — BASIC METABOLIC PANEL
Anion gap: 8 (ref 5–15)
BUN: 64 mg/dL — ABNORMAL HIGH (ref 6–20)
CHLORIDE: 111 mmol/L (ref 101–111)
CO2: 21 mmol/L — ABNORMAL LOW (ref 22–32)
CREATININE: 2.94 mg/dL — AB (ref 0.61–1.24)
Calcium: 9.5 mg/dL (ref 8.9–10.3)
GFR calc non Af Amer: 24 mL/min — ABNORMAL LOW (ref >60)
GFR, EST AFRICAN AMERICAN: 28 mL/min — AB (ref >60)
Glucose, Bld: 62 mg/dL — ABNORMAL LOW (ref 65–99)
Potassium: 4.3 mmol/L (ref 3.5–5.1)
SODIUM: 140 mmol/L (ref 135–145)

## 2016-03-01 LAB — CBC
HCT: 27.9 % — ABNORMAL LOW (ref 40.0–52.0)
Hemoglobin: 8.9 g/dL — ABNORMAL LOW (ref 13.0–18.0)
MCH: 26.1 pg (ref 26.0–34.0)
MCHC: 32 g/dL (ref 32.0–36.0)
MCV: 81.6 fL (ref 80.0–100.0)
Platelets: 188 10*3/uL (ref 150–440)
RBC: 3.42 MIL/uL — ABNORMAL LOW (ref 4.40–5.90)
RDW: 17 % — ABNORMAL HIGH (ref 11.5–14.5)
WBC: 9.9 10*3/uL (ref 3.8–10.6)

## 2016-03-01 LAB — GLUCOSE, CAPILLARY
Glucose-Capillary: 52 mg/dL — ABNORMAL LOW (ref 65–99)
Glucose-Capillary: 185 mg/dL — ABNORMAL HIGH (ref 65–99)
Glucose-Capillary: 265 mg/dL — ABNORMAL HIGH (ref 65–99)

## 2016-03-01 MED ORDER — MUPIROCIN 2 % EX OINT: 1.0000 "application " | TOPICAL_OINTMENT | Freq: Two times a day (BID) | CUTANEOUS | Status: DC

## 2016-03-01 NOTE — Progress Notes (Deleted)
Pt requested soft diet. Primary nurse notified Dr. Konidena. Orders received for soft diet.  

## 2016-03-01 NOTE — Progress Notes (Signed)
Pt's 8am FSBS was 52. Pt felt shaky. Patient given apple juice and graham crackers, awaiting breakfast arrival. Recheck was 185. Pt feels better. Will not give AM insulin and will continue to monitor FSBS. Diabetes Coordinator following.

## 2016-03-01 NOTE — Progress Notes (Signed)
     Juan ButtersWaltzie Holzhauer was admitted to the Hospital on 02/27/2016 and Discharged  03/01/2016 and should be excused from work/school   for 4 days starting 02/27/2016 , may return to work/school without any restrictions.    Katha HammingKONIDENA,Tereza Gilham M.D on 03/01/2016,at 1:19 PM

## 2016-03-01 NOTE — Discharge Instructions (Signed)
Stop  Lasix,losartan and metozalone Follow up with Munsoor lateef in   One week

## 2016-03-01 NOTE — Progress Notes (Signed)
Pt stable. IV removed. D/c instructions given and education provided. Electronic prescriptions verified. Pt states he understands instructions. Pt dressed and escorted out by staff. Driven home by family.

## 2016-03-01 NOTE — Progress Notes (Signed)
[facility]  ROUNDING NOTE   Subjective:   Wife at bedside.  Creatinine 2.94 (3.63) NA at 143mL/hr   Objective:  Vital signs in last 24 hours:  Temp:  [97.8 F (36.6 C)-98.2 F (36.8 C)] 97.8 F (36.6 C) (05/31 1142) Pulse Rate:  [85-95] 90 (05/31 1142) Resp:  [18-20] 18 (05/31 1142) BP: (144-167)/(67-81) 167/67 mmHg (05/31 1142) SpO2:  [99 %-100 %] 100 % (05/31 1142) Weight:  [87.136 kg (192 lb 1.6 oz)] 87.136 kg (192 lb 1.6 oz) (05/31 0500)  Weight change: -0.318 kg (-11.2 oz) Filed Weights   02/28/16 0140 02/29/16 0500 03/01/16 0500  Weight: 86.637 kg (191 lb) 87.454 kg (192 lb 12.8 oz) 87.136 kg (192 lb 1.6 oz)    Intake/Output: I/O last 3 completed shifts: In: 4257.2 [P.O.:600; I.V.:3657.2] Out: 3550 [Urine:3550]   Intake/Output this shift:  Total I/O In: 454 [I.V.:454] Out: -   Physical Exam: General: NAD, resting in bed  Head: Normocephalic, atraumatic. Moist oral mucosal membranes  Eyes: Anicteric  Neck: Supple, trachea midline  Lungs:  Clear to auscultation, normal effort  Heart: Regular rate and rhythm  Abdomen:  Soft, nontender, BS present  Extremities: No peripheral edema.  Neurologic: Nonfocal, moving all four extremities  Skin: No lesions       Basic Metabolic Panel:  Recent Labs Lab 02/27/16 2235 02/29/16 0414 03/01/16 0754  NA 136 141 140  K 4.6 4.4 4.3  CL 101 110 111  CO2 23 23 21*  GLUCOSE 267* 164* 62*  BUN 102* 82* 64*  CREATININE 4.82* 3.63* 2.94*  CALCIUM 9.0 8.8* 9.5    Liver Function Tests: No results for input(s): AST, ALT, ALKPHOS, BILITOT, PROT, ALBUMIN in the last 168 hours. No results for input(s): LIPASE, AMYLASE in the last 168 hours. No results for input(s): AMMONIA in the last 168 hours.  CBC:  Recent Labs Lab 02/27/16 2235 03/01/16 0729  WBC 4.8 9.9  HGB 7.9* 8.9*  HCT 24.9* 27.9*  MCV 80.7 81.6  PLT 160 188    Cardiac Enzymes: No results for input(s): CKTOTAL, CKMB, CKMBINDEX,  TROPONINI in the last 168 hours.  BNP: Invalid input(s): POCBNP  CBG:  Recent Labs Lab 02/29/16 1654 02/29/16 2120 03/01/16 0750 03/01/16 0834 03/01/16 1140  GLUCAP 203* 118* 52* 185* 265*    Microbiology: Results for orders placed or performed during the hospital encounter of 02/27/16  MRSA PCR Screening     Status: Abnormal   Collection Time: 02/28/16  4:28 AM  Result Value Ref Range Status   MRSA by PCR POSITIVE (A) NEGATIVE Final    Comment:        The GeneXpert MRSA Assay (FDA approved for NASAL specimens only), is one component of a comprehensive MRSA colonization surveillance program. It is not intended to diagnose MRSA infection nor to guide or monitor treatment for MRSA infections. CRITICAL RESULT CALLED TO, READ BACK BY AND VERIFIED WITH: Liz Malady AT 1610 02/28/16.PMH     Coagulation Studies:  Recent Labs  02/28/16 0005  LABPROT 15.7*  INR 1.23    Urinalysis:  Recent Labs  02/28/16 0113  COLORURINE YELLOW*  LABSPEC 1.009  PHURINE 5.0  GLUCOSEU 50*  HGBUR NEGATIVE  BILIRUBINUR NEGATIVE  KETONESUR NEGATIVE  PROTEINUR 100*  NITRITE NEGATIVE  LEUKOCYTESUR NEGATIVE      Imaging: US Renal  02/29/2016  CLINICAL DATA:  Acute renal failure. EXAM: RENAL / URINARY TRACT ULTRASOUND COMPLETE COMPARISON:  07/26/2014 FINDINGS: Right Kidney: Length: 10.2 cm. Echogenicity within  normal limits. No mass or hydronephrosis visualized. No significant atrophy. Stable appearance since the prior study. Left Kidney: Length: 11.0 cm. Echogenicity within normal limits. No mass or hydronephrosis visualized. No significant atrophy. Stable appearance since the prior study. Bladder: Appears normal for degree of bladder distention. IMPRESSION: Stable renal ultrasound. No evidence of hydronephrosis or significant renal atrophy. Electronically Signed   By: Irish LackGlenn  Yamagata M.D.   On: 02/29/2016 08:34     Medications:   . sodium chloride 125 mL/hr at 03/01/16 0336    . atorvastatin  80 mg Oral q1800  . carvedilol  6.25 mg Oral BID WC  . Chlorhexidine Gluconate Cloth  6 each Topical Q0600  . cloNIDine  0.2 mg Oral TID  . heparin  5,000 Units Subcutaneous Q8H  . insulin aspart  0-15 Units Subcutaneous TID WC  . insulin detemir  18 Units Subcutaneous QHS  . mupirocin ointment  1 application Nasal BID  . omega-3 acid ethyl esters  1 g Oral Daily  . senna  1 tablet Oral Daily   acetaminophen **OR** acetaminophen, ondansetron **OR** ondansetron (ZOFRAN) IV  Assessment/ Plan:  45 y.o. male with past medical history of hypertension, diabetes mellitus type 2, chronic kidney disease stage III, peripheral neuropathy  1. Acute renal failure on Chronic kidney disease stage III secondary to diabetic nephropathy with proteinuria. Baseline creatinine of 1.9, 3/17 - prerenal azotemia. Improved with holding fursoemide, metolazone and losartan.  - Encourage PO intake.  - will need outpatient follow up .   2. Hypertension.  -  Continue Coreg.  - Discontinued losartan given acute renal failure.   3. Anemia chronic kidney disease: epo given on 5/29   LOS: 2 Juan Roberson 5/31/201711:48 AM

## 2016-03-01 NOTE — Progress Notes (Signed)
Inpatient Diabetes Program Recommendations  AACE/ADA: New Consensus Statement on Inpatient Glycemic Control (2015)  Target Ranges:  Prepandial:   less than 140 mg/dL      Peak postprandial:   less than 180 mg/dL (1-2 hours)      Critically ill patients:  140 - 180 mg/dL   Results for Juan Roberson, Juan L JR. (MRN [id number]) as of 03/01/2016 09:30  Ref. Range 02/29/2016 07:46 02/29/2016 11:56 02/29/2016 16:54 02/29/2016 21:20  Glucose-Capillary Latest Ref Range: 65-99 mg/dL 83 782231 (H) 956203 (H) 213118 (H)   Results for Juan Roberson, Juan L JR. (MRN [id number]) as of 03/01/2016 09:30  Ref. Range 03/01/2016 07:50 03/01/2016 08:34  Glucose-Capillary Latest Ref Range: 65-99 mg/dL 52 (L) 629185 (H)    Admit with: Hyperglycemia/ Acute on Chronic Renal Failure  History: DM, CKD3  Home DM Meds: Tresiba insulin- 25 units daily  Current Insulin Orders: Levemir 18 units QHS      Novolog Moderate Correction Scale/ SSI (0-15 units) TID AC      MD- Note patient with elevated postprandial glucose levels yesterday and now with Hypoglycemia this AM.   Please consider the following in-hospital insulin adjustments:  1. Reduce Levemir to 15 units QHS  2. Reduce Novolog Correction Scale/ SSI to Sensitive scale (0-9 units) TID AC   3. Start low dose Novolog Meal Coverage- Novolog 3 units tid with meals (hold if pt eats <50% of meal)      --Will follow patient during hospitalization--  Ambrose FinlandJeannine Johnston Suheyb Raucci RN, MSN, CDE Diabetes Coordinator Inpatient Glycemic Control Team Team Pager: [phone] (8a-5p)

## 2016-03-03 NOTE — Discharge Summary (Signed)
Juan BlossomWaltzie L Wildey Jr., is a 45 y.o. male  DOB 01/07/1971  MRN [id number].  Admission date:  02/27/2016  Admitting Physician  Arnaldo NatalMichael S Diamond, MD  Discharge Date:  03/01/2016   Primary MD  Corky DownsMASOUD,JAVED, MD  Recommendations for primary care physician for things to follow:   Follow-up with Dr. Mady HaagensenMunsoor Lateef in 1 week   Admission Diagnosis  Acute on chronic renal failure (HCC) [N17.9, N18.9] Insulin dependent diabetes mellitus (HCC) [E11.9, Z79.4] Warfarin anticoagulation [Z79.01] Anemia, unspecified anemia type [D64.9]   Discharge Diagnosis  Acute on chronic renal failure (HCC) [N17.9, N18.9] Insulin dependent diabetes mellitus (HCC) [E11.9, Z79.4] Warfarin anticoagulation [Z79.01] Anemia, unspecified anemia type [D64.9]    Active Problems:   Acute on chronic kidney failure [facility](HCC)      Past Medical History  Diagnosis Date  . Diabetes mellitus without complication (HCC)     a. Dx ~ 1996.  . Essential hypertension   . CKD (chronic kidney disease), stage III   . GERD (gastroesophageal reflux disease)   . Cellulitis and abscess of foot     Left-Dr. Ether GriffinsFowler  . Osteomyelitis (HCC)     a. 05/2015 L foot.  . Left leg DVT (HCC)     a. Dx 05/2015 -> Coumadin.  . Gastritis     a. 04/2015 hematemesis -> EGD: gastritis, esophagitis, duodenitis.  No active bleeding.  PPI added.  . MI (myocardial infarction) [facility](HCC)     Past Surgical History  Procedure Laterality Date  . I&d extremity Left 04/06/2015    Procedure: IRRIGATION AND DEBRIDEMENT EXTREMITY;  Surgeon: Gwyneth RevelsJustin Fowler, DPM;  Location: [facility] ORS;  Service: Podiatry;  Laterality: Left;  . Esophagogastroduodenoscopy N/A 04/07/2015    Procedure: ESOPHAGOGASTRODUODENOSCOPY (EGD);  Surgeon: Midge Miniumarren Wohl, MD;  [facility];  Service: Endoscopy;  Laterality: N/A;  . Foot  surgery    . Irrigation and debridement foot Left 05/21/2015    Procedure: IRRIGATION AND DEBRIDEMENT FOOT;  Surgeon: Linus Galasodd Cline, MD;  Location: [facility] ORS;  Service: Podiatry;  Laterality: Left;  . Esophagogastroduodenoscopy (egd) with propofol Left 06/30/2015    Procedure: ESOPHAGOGASTRODUODENOSCOPY (EGD) WITH PROPOFOL;  Surgeon: Christena DeemMartin U Skulskie, MD;  [facility];  Service: Endoscopy;  Laterality: Left;       History of present illness and  Hospital Course:     Kindly see H&P for history of present illness and admission details, please review complete Labs, Consult reports and Test reports for all details in brief  HPI  from the history and physical done on the day of admission  45 year old male patient came in because of blurred vision, hyperglycemia, blood sugar more than 400 at home. In the emergency room patient blood work showed acute on chronic renal failure. Creatinine 4.82 , BUN 102.  Hospital Course   #1 hyperglycemia: Causing dizziness. Patient sugars improved, patient was  On [place] flex touch  25 units daily at home. He received Levemir, insulin with sliding scale coverage in the hospital.  #2 acute on chronic renal failure BUN 102, creatinine 4.8 on admission. And started on IV hydration. Patient follows up with urology as an outpatient. Recently was taking the Lasix 80 mg in the morning and 40 mg at night to help with the pedal edema but  it caused worsening of renal failure. So nephrology suggested stop diuretics. Because of volume depletion stopped the Lasix and received gentle hydration patient is very sensitive to fluctuations in renal function with diuretics.[name]. Ultrasound did not show any obstruction. Patient  renal function improved with hydration. Down to 2.94 on May 31 and BUN 64. Nephrology said patient can go home and follow up with Dr. Cherylann Ratel as an outpatient in one week but continue to hold losartan, Lasix, metozalone.. And told the patient about the  same.    Discharge Con .dition: stable   Follow UP  Follow-up Information    Follow up with LATEEF, Rexene Edison, MD. Go on 03/07/2016.   Specialty:  Internal Medicine   Why:  @10 :30a   Contact information:   [address] [place] [address] [phone]         Discharge Instructions  and  Discharge Medications        Medication List    STOP taking these medications        furosemide 40 MG tablet  Commonly known as:  LASIX     losartan 25 MG tablet  Commonly known as:  COZAAR     metolazone 5 MG tablet  Commonly known as:  ZAROXOLYN      TAKE these medications        atorvastatin 80 MG tablet  Commonly known as:  LIPITOR  Take 1 tablet (80 mg total) by mouth daily at 6 PM.     B-D ULTRAFINE III SHORT PEN 31G X 8 MM Misc  Generic drug:  Insulin Pen Needle  Inject 1 pen into the skin daily.     carvedilol 6.25 MG tablet  Commonly known as:  COREG  Take 1 tablet by mouth 2 (two) times daily.     cloNIDine 0.2 MG tablet  Commonly known as:  CATAPRES  Take 1 tablet (0.2 mg total) by mouth 3 (three) times daily.     mupirocin ointment 2 %  Commonly known as:  BACTROBAN  Place 1 application into the nose 2 (two) times daily.     omega-3 fish oil 1000 MG Caps capsule  Commonly known as:  MAXEPA  Take 1 capsule by mouth daily.     senna 8.6 MG Tabs tablet  Commonly known as:  SENOKOT  Take 1 tablet by mouth daily.     TRESIBA FLEXTOUCH 200 UNIT/ML Sopn  Generic drug:  Insulin Degludec  Inject 25 Units into the skin daily.          Diet and Activity recommendation: See Discharge Instructions above   Consults obtained - nephrology   Major procedures and Radiology Reports - PLEASE review detailed and final reports for all details, in brief -    US Renal  02/29/2016  CLINICAL DATA:  Acute renal failure. EXAM: RENAL / URINARY TRACT ULTRASOUND COMPLETE COMPARISON:  07/26/2014 FINDINGS: Right Kidney: Length: 10.2 cm. Echogenicity within  normal limits. No mass or hydronephrosis visualized. No significant atrophy. Stable appearance since the prior study. Left Kidney: Length: 11.0 cm. Echogenicity within normal limits. No mass or hydronephrosis visualized. No significant atrophy. Stable appearance since the prior study. Bladder: Appears normal for degree of bladder distention. IMPRESSION: Stable renal ultrasound. No evidence of hydronephrosis or significant renal atrophy. Electronically Signed   By: Irish Lack M.D.   On: 02/29/2016 08:34    Micro Results    Recent Results (from the past 240 hour(s))  MRSA PCR Screening     Status: Abnormal   Collection Time: 02/28/16  4:28 AM  Result Value Ref Range Status   MRSA by PCR POSITIVE (A) NEGATIVE Final    Comment:        The GeneXpert MRSA Assay (  FDA approved for NASAL specimens only), is one component of a comprehensive MRSA colonization surveillance program. It is not intended to diagnose MRSA infection nor to guide or monitor treatment for MRSA infections. CRITICAL RESULT CALLED TO, READ BACK BY AND VERIFIED WITH: Liz Malady AT 7564 02/28/16.PMH        Today   Subjective:   Emanual Lamountain today has no headache,no chest abdominal pain,no new weakness tingling or numbness, feels much better wants to go home today.   Objective:   Blood pressure 167/67, pulse 90, temperature 97.8 F (36.6 C), temperature source Oral, resp. rate 18, height  (1.88 m), weight 87.136 kg (192 lb 1.6 oz), SpO2 100 %.  No intake or output data in the 24 hours ending 03/03/16 0723  Exam Awake Alert, Oriented x 3, No new F.N deficits, Normal affect [place].AT,PERRAL Supple Neck,No JVD, No cervical lymphadenopathy appriciated.  Symmetrical Chest wall movement, Good air movement bilaterally, CTAB RRR,No Gallops,Rubs or new Murmurs, No Parasternal Heave +ve B.Sounds, Abd Soft, Non tender, No organomegaly appriciated, No rebound -guarding or rigidity. No Cyanosis, Clubbing or edema,  No new Rash or bruise  Data Review   CBC w Diff:  Lab Results  Component Value Date   WBC 9.9 03/01/2016   WBC 5.7 07/27/2014   HGB 8.9* 03/01/2016   HGB 8.6* 07/27/2014   HCT 27.9* 03/01/2016   HCT 27.3* 07/27/2014   PLT 188 03/01/2016   PLT 164 07/27/2014   LYMPHOPCT 13 12/06/2015   LYMPHOPCT 33.8 07/27/2014   MONOPCT 8 12/06/2015   MONOPCT 7.6 07/27/2014   EOSPCT 0 12/06/2015   EOSPCT 3.2 07/27/2014   BASOPCT 0 12/06/2015   BASOPCT 1.0 07/27/2014    CMP:  Lab Results  Component Value Date   NA 140 03/01/2016   NA 140 09/18/2014   K 4.3 03/01/2016   K 5.6* 09/18/2014   CL 111 03/01/2016   CL 111* 09/18/2014   CO2 21* 03/01/2016   CO2 22 09/18/2014   BUN 64* 03/01/2016   BUN 52* 09/18/2014   CREATININE 2.94* 03/01/2016   CREATININE 2.50* 09/18/2014   PROT 7.9 06/28/2015   PROT 8.0 03/31/2014   ALBUMIN 4.2 06/28/2015   ALBUMIN 2.8* 07/28/2014   BILITOT 1.4* 06/28/2015   BILITOT 0.4 03/31/2014   ALKPHOS 85 06/28/2015   ALKPHOS 81 03/31/2014   AST 36 06/28/2015   AST 21 03/31/2014   ALT 23 06/28/2015   ALT 26 03/31/2014  .   Total Time in preparing paper work, data evaluation and todays exam - 35 minutes  Teresa Lemmerman M.D on 03/01/2016 at 7:23 AM    Note: This dictation was prepared with Dragon dictation along with smaller phrase technology. Any transcriptional errors that result from this process are unintentional.

## 2016-04-03 ENCOUNTER — Inpatient Hospital Stay: Payer: BLUE CROSS/BLUE SHIELD | Attending: Oncology | Admitting: Oncology

## 2016-05-10 ENCOUNTER — Ambulatory Visit: Payer: Self-pay | Admitting: Physician Assistant

## 2016-06-01 ENCOUNTER — Encounter: Payer: Self-pay | Admitting: *Deleted

## 2016-07-17 ENCOUNTER — Encounter: Payer: Self-pay | Admitting: Emergency Medicine

## 2016-07-17 ENCOUNTER — Emergency Department: Payer: BLUE CROSS/BLUE SHIELD

## 2016-07-17 ENCOUNTER — Emergency Department
Admission: EM | Admit: 2016-07-17 | Discharge: 2016-07-17 | Disposition: A | Discharge status: Home / Self Care | Payer: BLUE CROSS/BLUE SHIELD | Attending: Emergency Medicine | Admitting: Emergency Medicine

## 2016-07-17 DIAGNOSIS — R600 Localized edema: Secondary | ICD-10-CM | POA: Diagnosis not present

## 2016-07-17 DIAGNOSIS — I252 Old myocardial infarction: Secondary | ICD-10-CM | POA: Insufficient documentation

## 2016-07-17 DIAGNOSIS — E1122 Type 2 diabetes mellitus with diabetic chronic kidney disease: Secondary | ICD-10-CM | POA: Diagnosis not present

## 2016-07-17 DIAGNOSIS — R609 Edema, unspecified: Secondary | ICD-10-CM

## 2016-07-17 DIAGNOSIS — I129 Hypertensive chronic kidney disease with stage 1 through stage 4 chronic kidney disease, or unspecified chronic kidney disease: Secondary | ICD-10-CM | POA: Insufficient documentation

## 2016-07-17 DIAGNOSIS — Z79899 Other long term (current) drug therapy: Secondary | ICD-10-CM | POA: Diagnosis not present

## 2016-07-17 DIAGNOSIS — M7989 Other specified soft tissue disorders: Secondary | ICD-10-CM | POA: Diagnosis present

## 2016-07-17 DIAGNOSIS — N183 Chronic kidney disease, stage 3 (moderate): Secondary | ICD-10-CM | POA: Insufficient documentation

## 2016-07-17 LAB — BASIC METABOLIC PANEL
ANION GAP: 8 (ref 5–15)
BUN: 61 mg/dL — ABNORMAL HIGH (ref 6–20)
CALCIUM: 8.8 mg/dL — AB (ref 8.9–10.3)
CO2: 21 mmol/L — ABNORMAL LOW (ref 22–32)
Chloride: 108 mmol/L (ref 101–111)
Creatinine, Ser: 2.77 mg/dL — ABNORMAL HIGH (ref 0.61–1.24)
GFR, EST AFRICAN AMERICAN: 30 mL/min — AB (ref >60)
GFR, EST NON AFRICAN AMERICAN: 26 mL/min — AB (ref >60)
GLUCOSE: 288 mg/dL — AB (ref 65–99)
POTASSIUM: 4.9 mmol/L (ref 3.5–5.1)
Sodium: 137 mmol/L (ref 135–145)

## 2016-07-17 LAB — CBC
HEMATOCRIT: 28.9 % — AB (ref 40.0–52.0)
HEMOGLOBIN: 9.4 g/dL — AB (ref 13.0–18.0)
MCH: 28 pg (ref 26.0–34.0)
MCHC: 32.6 g/dL (ref 32.0–36.0)
MCV: 85.9 fL (ref 80.0–100.0)
Platelets: 94 10*3/uL — ABNORMAL LOW (ref 150–440)
RBC: 3.36 MIL/uL — AB (ref 4.40–5.90)
RDW: 16.3 % — ABNORMAL HIGH (ref 11.5–14.5)
WBC: 4.6 10*3/uL (ref 3.8–10.6)

## 2016-07-17 LAB — TROPONIN I: TROPONIN I: 0.03 ng/mL — AB (ref <0.03)

## 2016-07-17 MED ORDER — MUPIROCIN 2 % EX OINT: TOPICAL_OINTMENT | CUTANEOUS | 0 refills | Status: DC

## 2016-07-17 NOTE — ED Notes (Signed)
PCP is Masoud and nephrologist is Actuary.

## 2016-07-17 NOTE — ED Provider Notes (Signed)
[facility] Emergency Department Provider Note        Time seen: ----------------------------------------- 1:04 PM on 07/17/2016 -----------------------------------------    I have reviewed the triage vital signs and the nursing notes.   HISTORY  Chief Complaint Leg Swelling    HPI Juan BlossomWaltzie L Hainer Jr. is a 45 y.o. male who presents to ER with swelling in both his feet and ankles for several days. Patient states she has appointment with kidney specialist on October 26. She reports long history of kidney problems, reports she had a blister to his right ankle that is new since yesterday and it was draining. He doesn't history of diabetes, also reports increased shortness of breath with activity. He denies recent illness or other complaints.   Past Medical History:  Diagnosis Date  . Cellulitis and abscess of foot    Left-Dr. Ether GriffinsFowler  . CKD (chronic kidney disease), stage III   . Diabetes mellitus without complication (HCC)    a. Dx ~ 1996.  . Essential hypertension   . Gastritis    a. 04/2015 hematemesis -> EGD: gastritis, esophagitis, duodenitis.  No active bleeding.  PPI added.  [name]. GERD (gastroesophageal reflux disease)   . Left leg DVT (HCC)    a. Dx 05/2015 -> Coumadin.  . MI (myocardial infarction)   . Osteomyelitis (HCC)    a. 05/2015 L foot.    Patient Active Problem List   Diagnosis Date Noted  . Acute on chronic kidney failure (HCC) 02/28/2016  . Cellulitis 12/06/2015  . AKI (acute kidney injury) (HCC) 11/01/2015  . Hyperkalemia 11/01/2015    Past Surgical History:  Procedure Laterality Date  . ESOPHAGOGASTRODUODENOSCOPY N/A 04/07/2015   Procedure: ESOPHAGOGASTRODUODENOSCOPY (EGD);  Surgeon: Midge Miniumarren Wohl, MD;  [facility] ENDOSCOPY;  Service: Endoscopy;  Laterality: N/A;  . ESOPHAGOGASTRODUODENOSCOPY (EGD) WITH PROPOFOL Left 06/30/2015   Procedure: ESOPHAGOGASTRODUODENOSCOPY (EGD) WITH PROPOFOL;  Surgeon: Christena DeemMartin U Skulskie, MD;  [facility];  Service: Endoscopy;  Laterality: Left;  . FOOT SURGERY    . I&D EXTREMITY Left 04/06/2015   Procedure: IRRIGATION AND DEBRIDEMENT EXTREMITY;  Surgeon: Gwyneth RevelsJustin Fowler, DPM;  Location: [facility] ORS;  Service: Podiatry;  Laterality: Left;  . IRRIGATION AND DEBRIDEMENT FOOT Left 05/21/2015   Procedure: IRRIGATION AND DEBRIDEMENT FOOT;  Surgeon: Linus Galasodd Cline, MD;  Location: [facility] ORS;  Service: Podiatry;  Laterality: Left;    Allergies Sulfur  Social History Social History  Substance Use Topics  . Smoking status: Never Smoker  . Smokeless tobacco: Never Used  . Alcohol use No    Review of Systems Constitutional: Negative for fever. Cardiovascular: Negative for chest pain. Respiratory: Negative for shortness of breath. Gastrointestinal: Negative for abdominal pain, vomiting and diarrhea. Genitourinary: Negative for dysuria. Musculoskeletal: Positive for leg swelling Skin: Negative for rash. Neurological: Negative for headaches, focal weakness or numbness.  10-point ROS otherwise negative.  ____________________________________________   PHYSICAL EXAM:  VITAL SIGNS: ED Triage Vitals  Enc Vitals Group     BP 07/17/16 1041 (!) 155/88     Pulse Rate 07/17/16 1041 (!) 116     Resp 07/17/16 1041 18     Temp 07/17/16 1041 98.1 F (36.7 C)     Temp Source 07/17/16 1041 Oral     SpO2 07/17/16 1041 99 %     Weight 07/17/16 1042 186 lb (84.4 kg)     Height 07/17/16 1042 6\' 2"  (1.88 m)     Head Circumference --      Peak Flow --  Pain Score 07/17/16 1047 9     Pain Loc --      Pain Edu? --      Excl. in GC? --     Constitutional: Alert and oriented. Well appearing and in no distress. Eyes: Conjunctivae are normal. PERRL. Normal extraocular movements. ENT   Head: Normocephalic and atraumatic.   Nose: No congestion/rhinnorhea.   Mouth/Throat: Mucous membranes are moist.   Neck: No stridor. Cardiovascular: Normal rate, regular rhythm. S3 is noted, slight  murmur Respiratory: Normal respiratory effort without tachypnea nor retractions. Breath sounds are clear and equal bilaterally. No wheezes/rales/rhonchi. Gastrointestinal: Soft and nontender. Normal bowel sounds Musculoskeletal: Nontender with normal range of motion in all extremities. Mild pitting edema lower extremities. Abrasion noted over the right ankle medially Neurologic:  Normal speech and language. No gross focal neurologic deficits are appreciated.  Skin:  Skin is warm, dry and intact. No rash noted. Psychiatric: Mood and affect are normal. Speech and behavior are normal.  ____________________________________________  EKG: Interpreted by me. Atrial fibrillation with a rate of 107 bpm, wide QRS, normal QT, left axis deviation, right bundle branch block  ____________________________________________  ED COURSE:  Pertinent labs & imaging results that were available during my care of the patient were reviewed by me and considered in my medical decision making (see chart for details). Clinical Course  Patient presents to the ER with concerns for edema. We will assess with basic labs and imaging. He has known history of chronic kidney disease and possible cardiomyopathy. Chronic atrial fibrillation.  Procedures ____________________________________________   LABS (pertinent positives/negatives)  Labs Reviewed  BASIC METABOLIC PANEL - Abnormal; Notable for the following:       Result Value   CO2 21 (*)    Glucose, Bld 288 (*)    BUN 61 (*)    Creatinine, Ser 2.77 (*)    Calcium 8.8 (*)    GFR calc non Af Amer 26 (*)    GFR calc Af Amer 30 (*)    All other components within normal limits  CBC - Abnormal; Notable for the following:    RBC 3.36 (*)    Hemoglobin 9.4 (*)    HCT 28.9 (*)    RDW 16.3 (*)    Platelets 94 (*)    All other components within normal limits  TROPONIN I - Abnormal; Notable for the following:    Troponin I 0.03 (*)    All other components within normal  limits    RADIOLOGY Chest x-ray IMPRESSION: No active cardiopulmonary disease.  Mild cardiomegaly.   ____________________________________________  FINAL ASSESSMENT AND PLAN  Edema  Plan: Patient with labs and imaging as dictated above. Patient is in no acute distress, labs are within normal limits for him. I will advise him to double his Lasix for the next 72 hours. He can continue outpatient follow-up as scheduled, return for worsening symptoms.   Emily Filbert, MD   Note: This dictation was prepared with Dragon dictation. Any transcriptional errors that result from this process are unintentional    Emily Filbert, MD 07/17/16 1309

## 2016-07-17 NOTE — ED Notes (Signed)
Pt c/o bilateral ankle swelling started over the weekend with a  Blister to the right inner ankle that busted on Sunday.  Pt with hx DM, CHF, Kidney Failure. Pt denies any pain at this time and is no acute distress. Pt placed on cardiac monitor for monitoring.

## 2016-07-17 NOTE — ED Triage Notes (Signed)
Pt to ed with c/o swelling in bilat feet and ankles x several days.  Pt states has appt with kidney MD on Oct 26th.  Pt reports long history of kidney problems.  Pt also reports blister to right ankle that is new since yesterday.  Hx of diabetes. Pt also reports increased sob with activity.

## 2016-07-17 NOTE — ED Notes (Signed)
Work note for today and tomorrow along with rx for bactroban given at time of discharge.

## 2016-07-17 NOTE — ED Notes (Signed)
Patient denies pain and is resting comfortably.  

## 2016-07-21 ENCOUNTER — Observation Stay
Admission: EM | Admit: 2016-07-21 | Discharge: 2016-07-22 | Disposition: A | Discharge status: Home / Self Care | Payer: BLUE CROSS/BLUE SHIELD | Attending: Internal Medicine | Admitting: Internal Medicine

## 2016-07-21 ENCOUNTER — Emergency Department: Payer: BLUE CROSS/BLUE SHIELD

## 2016-07-21 ENCOUNTER — Encounter: Payer: Self-pay | Admitting: Emergency Medicine

## 2016-07-21 DIAGNOSIS — I480 Paroxysmal atrial fibrillation: Secondary | ICD-10-CM | POA: Diagnosis not present

## 2016-07-21 DIAGNOSIS — D61818 Other pancytopenia: Secondary | ICD-10-CM | POA: Diagnosis not present

## 2016-07-21 DIAGNOSIS — N184 Chronic kidney disease, stage 4 (severe): Secondary | ICD-10-CM | POA: Insufficient documentation

## 2016-07-21 DIAGNOSIS — I248 Other forms of acute ischemic heart disease: Secondary | ICD-10-CM | POA: Insufficient documentation

## 2016-07-21 DIAGNOSIS — I130 Hypertensive heart and chronic kidney disease with heart failure and stage 1 through stage 4 chronic kidney disease, or unspecified chronic kidney disease: Secondary | ICD-10-CM | POA: Insufficient documentation

## 2016-07-21 DIAGNOSIS — E0590 Thyrotoxicosis, unspecified without thyrotoxic crisis or storm: Secondary | ICD-10-CM | POA: Diagnosis not present

## 2016-07-21 DIAGNOSIS — R079 Chest pain, unspecified: Secondary | ICD-10-CM | POA: Diagnosis present

## 2016-07-21 DIAGNOSIS — I4891 Unspecified atrial fibrillation: Secondary | ICD-10-CM

## 2016-07-21 DIAGNOSIS — R21 Rash and other nonspecific skin eruption: Secondary | ICD-10-CM | POA: Diagnosis not present

## 2016-07-21 DIAGNOSIS — J209 Acute bronchitis, unspecified: Secondary | ICD-10-CM | POA: Diagnosis not present

## 2016-07-21 DIAGNOSIS — Z86718 Personal history of other venous thrombosis and embolism: Secondary | ICD-10-CM | POA: Insufficient documentation

## 2016-07-21 DIAGNOSIS — Z8249 Family history of ischemic heart disease and other diseases of the circulatory system: Secondary | ICD-10-CM | POA: Insufficient documentation

## 2016-07-21 DIAGNOSIS — Z79899 Other long term (current) drug therapy: Secondary | ICD-10-CM | POA: Diagnosis not present

## 2016-07-21 DIAGNOSIS — Z23 Encounter for immunization: Secondary | ICD-10-CM | POA: Diagnosis not present

## 2016-07-21 DIAGNOSIS — I252 Old myocardial infarction: Secondary | ICD-10-CM | POA: Insufficient documentation

## 2016-07-21 DIAGNOSIS — E1122 Type 2 diabetes mellitus with diabetic chronic kidney disease: Secondary | ICD-10-CM | POA: Insufficient documentation

## 2016-07-21 DIAGNOSIS — I429 Cardiomyopathy, unspecified: Secondary | ICD-10-CM | POA: Insufficient documentation

## 2016-07-21 DIAGNOSIS — Z794 Long term (current) use of insulin: Secondary | ICD-10-CM | POA: Diagnosis not present

## 2016-07-21 DIAGNOSIS — I259 Chronic ischemic heart disease, unspecified: Secondary | ICD-10-CM

## 2016-07-21 DIAGNOSIS — I509 Heart failure, unspecified: Secondary | ICD-10-CM | POA: Diagnosis not present

## 2016-07-21 DIAGNOSIS — R112 Nausea with vomiting, unspecified: Secondary | ICD-10-CM | POA: Diagnosis not present

## 2016-07-21 DIAGNOSIS — R791 Abnormal coagulation profile: Secondary | ICD-10-CM

## 2016-07-21 LAB — PROTIME-INR
INR: 1.23
Prothrombin Time: 15.6 seconds — ABNORMAL HIGH (ref 11.4–15.2)

## 2016-07-21 LAB — BASIC METABOLIC PANEL
ANION GAP: 14 (ref 5–15)
BUN: 61 mg/dL — ABNORMAL HIGH (ref 6–20)
CO2: 20 mmol/L — AB (ref 22–32)
Calcium: 9.8 mg/dL (ref 8.9–10.3)
Chloride: 106 mmol/L (ref 101–111)
Creatinine, Ser: 2.64 mg/dL — ABNORMAL HIGH (ref 0.61–1.24)
GFR calc Af Amer: 31 mL/min — ABNORMAL LOW (ref >60)
GFR, EST NON AFRICAN AMERICAN: 27 mL/min — AB (ref >60)
GLUCOSE: 245 mg/dL — AB (ref 65–99)
POTASSIUM: 4.2 mmol/L (ref 3.5–5.1)
Sodium: 140 mmol/L (ref 135–145)

## 2016-07-21 LAB — GLUCOSE, CAPILLARY
GLUCOSE-CAPILLARY: 226 mg/dL — AB (ref 65–99)
Glucose-Capillary: 171 mg/dL — ABNORMAL HIGH (ref 65–99)
Glucose-Capillary: 193 mg/dL — ABNORMAL HIGH (ref 65–99)

## 2016-07-21 LAB — TROPONIN I
TROPONIN I: 0.87 ng/mL — AB (ref <0.03)
TROPONIN I: 0.86 ng/mL — AB (ref <0.03)
Troponin I: 0.17 ng/mL (ref <0.03)

## 2016-07-21 LAB — CBC
HEMATOCRIT: 33.2 % — AB (ref 40.0–52.0)
Hemoglobin: 10.9 g/dL — ABNORMAL LOW (ref 13.0–18.0)
MCH: 28 pg (ref 26.0–34.0)
MCHC: 32.7 g/dL (ref 32.0–36.0)
MCV: 85.6 fL (ref 80.0–100.0)
PLATELETS: 156 10*3/uL (ref 150–440)
RBC: 3.88 MIL/uL — AB (ref 4.40–5.90)
RDW: 15.6 % — ABNORMAL HIGH (ref 11.5–14.5)
WBC: 8.1 10*3/uL (ref 3.8–10.6)

## 2016-07-21 LAB — MRSA PCR SCREENING: MRSA BY PCR: POSITIVE — AB

## 2016-07-21 LAB — TSH

## 2016-07-21 LAB — BRAIN NATRIURETIC PEPTIDE: B NATRIURETIC PEPTIDE 5: 592 pg/mL — AB (ref 0.0–100.0)

## 2016-07-21 MED ORDER — FUROSEMIDE 40 MG PO TABS
40.0000 mg | ORAL_TABLET | Freq: Every day | ORAL | Status: DC
  Administered 2016-07-22: 40 mg via ORAL
  Filled 2016-07-21: qty 1

## 2016-07-21 MED ORDER — CEFTRIAXONE SODIUM-DEXTROSE 1-3.74 GM-% IV SOLR
1.0000 g | INTRAVENOUS | Status: DC
  Administered 2016-07-21: 1 g via INTRAVENOUS
  Filled 2016-07-21 (×2): qty 50

## 2016-07-21 MED ORDER — AZITHROMYCIN 250 MG PO TABS
250.0000 mg | ORAL_TABLET | Freq: Every day | ORAL | Status: DC
  Administered 2016-07-22: 250 mg via ORAL
  Filled 2016-07-21: qty 1

## 2016-07-21 MED ORDER — BUDESONIDE 0.25 MG/2ML IN SUSP
0.2500 mg | Freq: Two times a day (BID) | RESPIRATORY_TRACT | Status: DC
  Administered 2016-07-21 – 2016-07-22 (×2): 0.25 mg via RESPIRATORY_TRACT
  Filled 2016-07-21 (×2): qty 2

## 2016-07-21 MED ORDER — INSULIN ASPART 100 UNIT/ML ~~LOC~~ SOLN: 0.0000 [IU] | Freq: Every day | SUBCUTANEOUS | Status: DC

## 2016-07-21 MED ORDER — ASPIRIN EC 81 MG PO TBEC
81.0000 mg | DELAYED_RELEASE_TABLET | Freq: Every day | ORAL | Status: DC
  Administered 2016-07-22: 81 mg via ORAL
  Filled 2016-07-21: qty 1

## 2016-07-21 MED ORDER — IPRATROPIUM-ALBUTEROL 0.5-2.5 (3) MG/3ML IN SOLN
3.0000 mL | Freq: Four times a day (QID) | RESPIRATORY_TRACT | Status: DC
  Administered 2016-07-21 (×2): 3 mL via RESPIRATORY_TRACT
  Filled 2016-07-21 (×2): qty 3

## 2016-07-21 MED ORDER — ONDANSETRON HCL 4 MG/2ML IJ SOLN
4.0000 mg | Freq: Once | INTRAMUSCULAR | Status: AC
  Administered 2016-07-21: 4 mg via INTRAVENOUS

## 2016-07-21 MED ORDER — AZITHROMYCIN 250 MG PO TABS
500.0000 mg | ORAL_TABLET | Freq: Every day | ORAL | Status: AC
  Administered 2016-07-21: 500 mg via ORAL
  Filled 2016-07-21: qty 2

## 2016-07-21 MED ORDER — INFLUENZA VAC SPLIT QUAD 0.5 ML IM SUSY
0.5000 mL | PREFILLED_SYRINGE | INTRAMUSCULAR | Status: AC
  Administered 2016-07-22: 0.5 mL via INTRAMUSCULAR
  Filled 2016-07-21: qty 0.5

## 2016-07-21 MED ORDER — CARVEDILOL 6.25 MG PO TABS
6.2500 mg | ORAL_TABLET | Freq: Two times a day (BID) | ORAL | Status: DC
  Administered 2016-07-21 – 2016-07-22 (×3): 6.25 mg via ORAL
  Filled 2016-07-21 (×3): qty 1

## 2016-07-21 MED ORDER — ACETAMINOPHEN 650 MG RE SUPP
650.0000 mg | Freq: Four times a day (QID) | RECTAL | Status: DC | PRN
Start: 2016-07-21 — End: 2016-07-22

## 2016-07-21 MED ORDER — CLONIDINE HCL 0.1 MG PO TABS
0.2000 mg | ORAL_TABLET | Freq: Two times a day (BID) | ORAL | Status: DC
  Administered 2016-07-21 – 2016-07-22 (×2): 0.2 mg via ORAL
  Filled 2016-07-21 (×2): qty 2

## 2016-07-21 MED ORDER — INSULIN ASPART 100 UNIT/ML ~~LOC~~ SOLN
0.0000 [IU] | Freq: Three times a day (TID) | SUBCUTANEOUS | Status: DC
Start: 2016-07-21 — End: 2016-07-22
  Administered 2016-07-21: 3 [IU] via SUBCUTANEOUS
  Administered 2016-07-22: 7 [IU] via SUBCUTANEOUS
  Filled 2016-07-21: qty 3
  Filled 2016-07-21: qty 7

## 2016-07-21 MED ORDER — ACETAMINOPHEN 325 MG PO TABS: 650.0000 mg | ORAL_TABLET | Freq: Four times a day (QID) | ORAL | Status: DC | PRN

## 2016-07-21 MED ORDER — CLONIDINE HCL 0.1 MG/24HR TD PTWK: 0.3000 mg | MEDICATED_PATCH | TRANSDERMAL | Status: DC

## 2016-07-21 MED ORDER — METOPROLOL TARTRATE 25 MG PO TABS
25.0000 mg | ORAL_TABLET | ORAL | Status: AC
  Administered 2016-07-21: 25 mg via ORAL
  Filled 2016-07-21: qty 1

## 2016-07-21 MED ORDER — HEPARIN SODIUM (PORCINE) 5000 UNIT/ML IJ SOLN
5000.0000 [IU] | Freq: Three times a day (TID) | INTRAMUSCULAR | Status: DC
  Administered 2016-07-21 – 2016-07-22 (×3): 5000 [IU] via SUBCUTANEOUS
  Filled 2016-07-21 (×3): qty 1

## 2016-07-21 MED ORDER — DILTIAZEM HCL 25 MG/5ML IV SOLN
15.0000 mg | INTRAVENOUS | Status: AC
  Administered 2016-07-21: 15 mg via INTRAVENOUS
  Filled 2016-07-21: qty 5

## 2016-07-21 MED ORDER — CHLORHEXIDINE GLUCONATE CLOTH 2 % EX PADS
6.0000 | MEDICATED_PAD | Freq: Every day | CUTANEOUS | Status: DC
  Administered 2016-07-22: 6 via TOPICAL

## 2016-07-21 MED ORDER — INSULIN GLARGINE 100 UNIT/ML ~~LOC~~ SOLN
25.0000 [IU] | Freq: Every day | SUBCUTANEOUS | Status: DC
  Administered 2016-07-22: 25 [IU] via SUBCUTANEOUS
  Filled 2016-07-21: qty 0.25

## 2016-07-21 MED ORDER — IPRATROPIUM-ALBUTEROL 0.5-2.5 (3) MG/3ML IN SOLN: 3.0000 mL | Freq: Four times a day (QID) | RESPIRATORY_TRACT | Status: DC | PRN

## 2016-07-21 MED ORDER — GLUCERNA SHAKE PO LIQD
237.0000 mL | Freq: Two times a day (BID) | ORAL | Status: DC
  Administered 2016-07-21: 237 mL via ORAL

## 2016-07-21 MED ORDER — MORPHINE SULFATE (PF) 2 MG/ML IV SOLN
INTRAVENOUS | Status: AC
  Filled 2016-07-21: qty 2

## 2016-07-21 MED ORDER — ONDANSETRON HCL 4 MG/2ML IJ SOLN: 4.0000 mg | Freq: Four times a day (QID) | INTRAMUSCULAR | Status: DC | PRN

## 2016-07-21 MED ORDER — AMIODARONE HCL 200 MG PO TABS
400.0000 mg | ORAL_TABLET | Freq: Two times a day (BID) | ORAL | Status: DC
  Administered 2016-07-21 – 2016-07-22 (×2): 400 mg via ORAL
  Filled 2016-07-21 (×2): qty 2

## 2016-07-21 MED ORDER — FUROSEMIDE 10 MG/ML IJ SOLN
20.0000 mg | Freq: Once | INTRAMUSCULAR | Status: AC
  Administered 2016-07-21: 20 mg via INTRAVENOUS
  Filled 2016-07-21: qty 4

## 2016-07-21 MED ORDER — TRIAMCINOLONE ACETONIDE 0.1 % EX CREA
TOPICAL_CREAM | Freq: Two times a day (BID) | CUTANEOUS | Status: DC
  Administered 2016-07-21 – 2016-07-22 (×3): via TOPICAL
  Filled 2016-07-21: qty 15

## 2016-07-21 MED ORDER — MORPHINE SULFATE (PF) 2 MG/ML IV SOLN
4.0000 mg | Freq: Once | INTRAVENOUS | Status: AC
  Administered 2016-07-21: 4 mg via INTRAVENOUS

## 2016-07-21 MED ORDER — ASPIRIN 81 MG PO CHEW
324.0000 mg | CHEWABLE_TABLET | Freq: Once | ORAL | Status: AC
  Administered 2016-07-21: 324 mg via ORAL
  Filled 2016-07-21: qty 4

## 2016-07-21 MED ORDER — DEXTROSE 5 % IV SOLN: 1.0000 g | INTRAVENOUS | Status: DC

## 2016-07-21 MED ORDER — ONDANSETRON HCL 4 MG/2ML IJ SOLN
INTRAMUSCULAR | Status: AC
  Administered 2016-07-21: 4 mg via INTRAVENOUS
  Filled 2016-07-21: qty 2

## 2016-07-21 MED ORDER — DILTIAZEM HCL 25 MG/5ML IV SOLN
10.0000 mg | Freq: Once | INTRAVENOUS | Status: DC
  Filled 2016-07-21: qty 5

## 2016-07-21 MED ORDER — MUPIROCIN 2 % EX OINT
1.0000 "application " | TOPICAL_OINTMENT | Freq: Two times a day (BID) | CUTANEOUS | Status: DC
  Administered 2016-07-22: 1 via NASAL
  Filled 2016-07-21: qty 22

## 2016-07-21 NOTE — ED Triage Notes (Signed)
Pt to ed via pov with c/o chest pain that started yesterday, pt moaning and with tachpnea noted.  Pt states pain started yesterday, constant and sob,

## 2016-07-21 NOTE — Progress Notes (Signed)
Juan BlossomWaltzie L Kramp Jr. is a 45 y.o. male  [id number]  Primary Cardiologist: Timoteo Carreiro Reason for Consultation:chest pains  HPI: [id number] came with chesst pains and afib with RVR and is having bronchitis. NO chest pain rightr now.   Review of Systems: NO PND/Orthopea or leg swelling   Past Medical History:  Diagnosis Date  . Cellulitis and abscess of foot    Left-Dr. Ether GriffinsFowler  . CKD (chronic kidney disease), stage III   . Diabetes mellitus without complication (HCC)    a. Dx ~ 1996.  . Essential hypertension   . Gastritis    a. 04/2015 hematemesis -> EGD: gastritis, esophagitis, duodenitis.  No active bleeding.  PPI added.  [name]. GERD (gastroesophageal reflux disease)   . Left leg DVT (HCC)    a. Dx 05/2015 -> Coumadin.  . MI (myocardial infarction)   . Osteomyelitis (HCC)    a. 05/2015 L foot.     (Not in a hospital admission)   . [START ON 07/22/2016] aspirin EC  81 mg Oral Daily  . azithromycin  500 mg Oral Daily   Followed by  . [START ON 07/22/2016] azithromycin  250 mg Oral Daily  . budesonide (PULMICORT) nebulizer solution  0.25 mg Nebulization BID  . carvedilol  6.25 mg Oral BID  . cefTRIAXone  1 g Intravenous Q24H  . cloNIDine  0.3 mg Transdermal Weekly  . cloNIDine  0.2 mg Oral BID  . [START ON 07/22/2016] furosemide  40 mg Oral Daily  . heparin  5,000 Units Subcutaneous Q8H  . insulin aspart  0-5 Units Subcutaneous QHS  . insulin aspart  0-9 Units Subcutaneous TID WC  . [START ON 07/22/2016] Insulin Glargine  25 Units Subcutaneous BH-q7a  . ipratropium-albuterol  3 mL Nebulization Q6H  . triamcinolone cream   Topical BID    Infusions:    Allergies  Allergen Reactions  . Sulfur Other (See Comments)    Pt states that this medication causes renal failure.      Social History   Social History  . Marital status: Single    Spouse name: N/A  . Number of children: 3  . Years of education: N/A   Occupational History  . Not on file.   Social History  Main Topics  . Smoking status: Never Smoker  . Smokeless tobacco: Never Used  . Alcohol use No  . Drug use: No  . Sexual activity: No   Other Topics Concern  . Not on file   Social History Narrative   Single, lives alone, 3 children    Family History  Problem Relation Age of Onset  . Coronary artery disease Father   . Pancreatic cancer Mother   . Breast cancer Sister   . Lung cancer Brother   . Cervical cancer Sister     PHYSICAL EXAM: Vitals:   07/21/16 1000 07/21/16 1015  BP: 134/84 139/83  Pulse: 87 91  Resp: 18 15    No intake or output data in the 24 hours ending 07/21/16 1131  General:  Well appearing. No respiratory difficulty HEENT: normal Neck: supple. no JVD. Carotids 2+ bilat; no bruits. No lymphadenopathy or thryomegaly appreciated. Cor: PMI nondisplaced. Regular rate & rhythm. No rubs, gallops or murmurs. Lungs: clear Abdomen: soft, nontender, nondistended. No hepatosplenomegaly. No bruits or masses. Good bowel sounds. Extremities: no cyanosis, clubbing, rash, edema Neuro: alert & oriented x 3, cranial nerves grossly intact. moves all 4 extremities w/o difficulty. Affect pleasant.  ECG: AFib  wwith RVR and RBBB  Results for orders placed or performed during the hospital encounter of 07/21/16 (from the past 24 hour(s))  Protime-INR     Status: Abnormal   Collection Time: 07/21/16  8:45 AM  Result Value Ref Range   Prothrombin Time 15.6 (H) 11.4 - 15.2 seconds   INR 1.23   CBC     Status: Abnormal   Collection Time: 07/21/16  8:45 AM  Result Value Ref Range   WBC 8.1 3.8 - 10.6 K/uL   RBC 3.88 (L) 4.40 - 5.90 MIL/uL   Hemoglobin 10.9 (L) 13.0 - 18.0 g/dL   HCT 16.1 (L) 09.6 - 04.5 %   MCV 85.6 80.0 - 100.0 fL   MCH 28.0 26.0 - 34.0 pg   MCHC 32.7 32.0 - 36.0 g/dL   RDW 40.9 (H) 81.1 - 91.4 %   Platelets 156 150 - 440 K/uL  Basic metabolic panel     Status: Abnormal   Collection Time: 07/21/16  8:45 AM  Result Value Ref Range   Sodium 140 135  - 145 mmol/L   Potassium 4.2 3.5 - 5.1 mmol/L   Chloride 106 101 - 111 mmol/L   CO2 20 (L) 22 - 32 mmol/L   Glucose, Bld 245 (H) 65 - 99 mg/dL   BUN 61 (H) 6 - 20 mg/dL   Creatinine, Ser 7.82 (H) 0.61 - 1.24 mg/dL   Calcium 9.8 8.9 - 95.6 mg/dL   GFR calc non Af Amer 27 (L) >60 mL/min   GFR calc Af Amer 31 (L) >60 mL/min   Anion gap 14 5 - 15  Troponin I     Status: Abnormal   Collection Time: 07/21/16  8:45 AM  Result Value Ref Range   Troponin I 0.17 (HH) <0.03 ng/mL  Brain natriuretic peptide     Status: Abnormal   Collection Time: 07/21/16  8:45 AM  Result Value Ref Range   B Natriuretic Peptide 592.0 (H) 0.0 - 100.0 pg/mL   Dg Chest Portable 1 View  Result Date: 07/21/2016 CLINICAL DATA:  Chest pain. EXAM: PORTABLE CHEST 1 VIEW COMPARISON:  07/17/2016. FINDINGS: Mediastinum hilar structures are normal. Lungs are clear. Cardiomegaly with normal pulmonary vascularity. Small left pleural effusion cannot be excluded. No pneumothorax . IMPRESSION: 1. Cardiomegaly.  No pulmonary venous congestion. 2. Small left pleural effusion cannot be excluded . Electronically Signed   By: Maisie Fus  Register   On: 07/21/2016 09:11     ASSESSMENT AND PLAN:Paroxysmal afib with RVR, now feeling better with no chest pains. Will start PO amiodrone. Troponins mildly elevated but creat too high for cath.  Etan Vasudevan A

## 2016-07-21 NOTE — Progress Notes (Signed)
Patient received to 2A from ED. Alert and oriented, able to make needs known. Tele box verified with Dorene GrebeNatalie NT. Skin assessment completed with RN Tammy, patient noted with open wound from blister on the right ankle and rash to abdomen and chest and left leg. Cream applied to area. Patient positive for MRSA by PCR screening, standing order initiated as well as contact precaution. Patient stable, no distress noted.

## 2016-07-21 NOTE — ED Provider Notes (Signed)
[facility] Emergency Department Provider Note   ____________________________________________   None    (approximate)  I have reviewed the triage vital signs and the nursing notes.   HISTORY  Chief Complaint Chest Pain    HPI Juan BlossomWaltzie L Bourcier Jr. is a 45 y.o. male day history of cardiac disease and hypertension chronic kidney disease  Patient presents today states that he's been having severe pain across his chest with a racing heart started about 7:30 this morning. 10 of 10 mid chest pressure. No shortness of breath. No fevers or chills. Does relate having swelling in his legs over the last week  Currently has clonidine, is taking his medications  Patient reports to me is also on Coumadin   Past Medical History:  Diagnosis Date  . Cellulitis and abscess of foot    Left-Dr. Ether GriffinsFowler  . CKD (chronic kidney disease), stage III   . Diabetes mellitus without complication (HCC)    a. Dx ~ 1996.  . Essential hypertension   . Gastritis    a. 04/2015 hematemesis -> EGD: gastritis, esophagitis, duodenitis.  No active bleeding.  PPI added.  [name]. GERD (gastroesophageal reflux disease)   . Left leg DVT (HCC)    a. Dx 05/2015 -> Coumadin.  . MI (myocardial infarction)   . Osteomyelitis (HCC)    a. 05/2015 L foot.    Patient Active Problem List   Diagnosis Date Noted  . Acute on chronic kidney failure (HCC) 02/28/2016  . Cellulitis 12/06/2015  . AKI (acute kidney injury) (HCC) 11/01/2015  . Hyperkalemia 11/01/2015    Past Surgical History:  Procedure Laterality Date  . ESOPHAGOGASTRODUODENOSCOPY N/A 04/07/2015   Procedure: ESOPHAGOGASTRODUODENOSCOPY (EGD);  Surgeon: Midge Miniumarren Wohl, MD;  [facility];  Service: Endoscopy;  Laterality: N/A;  . ESOPHAGOGASTRODUODENOSCOPY (EGD) WITH PROPOFOL Left 06/30/2015   Procedure: ESOPHAGOGASTRODUODENOSCOPY (EGD) WITH PROPOFOL;  Surgeon: Christena DeemMartin U Skulskie, MD;  [facility];  Service: Endoscopy;   Laterality: Left;  . FOOT SURGERY    . I&D EXTREMITY Left 04/06/2015   Procedure: IRRIGATION AND DEBRIDEMENT EXTREMITY;  Surgeon: Gwyneth RevelsJustin Fowler, DPM;  Location: [facility] ORS;  Service: Podiatry;  Laterality: Left;  . IRRIGATION AND DEBRIDEMENT FOOT Left 05/21/2015   Procedure: IRRIGATION AND DEBRIDEMENT FOOT;  Surgeon: Linus Galasodd Cline, MD;  Location: [facility] ORS;  Service: Podiatry;  Laterality: Left;    Prior to Admission medications   Medication Sig Start Date End Date Taking? Authorizing Provider  carvedilol (COREG) 6.25 MG tablet Take 1 tablet by mouth 2 (two) times daily. 11/19/15  Yes Historical Provider, MD  cloNIDine (CATAPRES - DOSED IN MG/24 HR) 0.3 mg/24hr patch Place 0.3 mg onto the skin once a week.   Yes Historical Provider, MD  cloNIDine (CATAPRES) 0.2 MG tablet Take 1 tablet (0.2 mg total) by mouth 3 (three) times daily. Patient taking differently: Take 0.2 mg by mouth 2 (two) times daily.  11/03/15  Yes Srikar Sudini, MD  furosemide (LASIX) 40 MG tablet Take 40 mg by mouth daily. 07/10/16  Yes Historical Provider, MD  TOUJEO SOLOSTAR 300 UNIT/ML SOPN Inject 25 Units into the skin every morning. 07/03/16  Yes Historical Provider, MD  B-D ULTRAFINE III SHORT PEN 31G X 8 MM MISC Inject 1 pen into the skin daily. 01/10/16   Historical Provider, MD  mupirocin ointment (BACTROBAN) 2 % Apply to affected area 3 times daily Patient not taking: Reported on 07/21/2016 07/17/16 07/17/17  Emily FilbertJonathan E Williams, MD  TRESIBA FLEXTOUCH 200 UNIT/ML SOPN Inject 25  Units into the skin daily.     Historical Provider, MD    Allergies Sulfur  Family History  Problem Relation Age of Onset  . Coronary artery disease Father   . Pancreatic cancer Mother   . Breast cancer Sister   . Lung cancer Brother     Social History Social History  Substance Use Topics  . Smoking status: Never Smoker  . Smokeless tobacco: Never Used  . Alcohol use No    Review of Systems Constitutional: No fever/chills Eyes: No visual  changes. ENT: No sore throat. Cardiovascular: See history of present illness Respiratory: Denies shortness of breath. Gastrointestinal: No abdominal pain.  No nausea, no vomiting.  No diarrhea.  No constipation. Genitourinary: Negative for dysuria. Musculoskeletal: Negative for back pain. Skin: Negative for rash. Neurological: Negative for headaches, focal weakness or numbness.  10-point ROS otherwise negative.  ____________________________________________   PHYSICAL EXAM:  VITAL SIGNS: ED Triage Vitals  Enc Vitals Group     BP 07/21/16 0835 (!) 203/104     Pulse Rate 07/21/16 0835 (!) 156     Resp --      Temp --      Temp src --      SpO2 07/21/16 0835 100 %     Weight 07/21/16 0836 186 lb (84.4 kg)     Height 07/21/16 0836 6\' 2"  (1.88 m)     Head Circumference --      Peak Flow --      Pain Score 07/21/16 0836 10     Pain Loc --      Pain Edu? --      Excl. in GC? --     Constitutional: Alert and oriented. Appears anxious, slightly diaphoretic, and in pain stating his central chest hurtsEyes: Conjunctivae are normal. PERRL. EOMI. Head: Atraumatic. Nose: No congestion/rhinnorhea. Mouth/Throat: Mucous membranes are moist.  Oropharynx non-erythematous. Neck: No stridor.   Cardiovascular: Irregular and tachycardic Grossly normal heart sounds.  Good peripheral circulation. Respiratory: Normal respiratory effort.  No retractions. Lungs CTAB. Gastrointestinal: Soft and nontender. No distention. No abdominal bruits. No CVA tenderness. Musculoskeletal: No lower extremity tenderness that he does demonstrate mild bilateral pitting edema.  No joint effusions. Neurologic:  Normal speech and language. No gross focal neurologic deficits are appreciated. Skin:  Skin is warm, dry and intact. No rash noted. Psychiatric: Mood and affect are normal. Speech and behavior are normal.  ____________________________________________   LABS (all labs ordered are listed, but only abnormal  results are displayed)  Labs Reviewed  PROTIME-INR - Abnormal; Notable for the following:       Result Value   Prothrombin Time 15.6 (*)    All other components within normal limits  CBC - Abnormal; Notable for the following:    RBC 3.88 (*)    Hemoglobin 10.9 (*)    HCT 33.2 (*)    RDW 15.6 (*)    All other components within normal limits  BASIC METABOLIC PANEL - Abnormal; Notable for the following:    CO2 20 (*)    Glucose, Bld 245 (*)    BUN 61 (*)    Creatinine, Ser 2.64 (*)    GFR calc non Af Amer 27 (*)    GFR calc Af Amer 31 (*)    All other components within normal limits  TROPONIN I - Abnormal; Notable for the following:    Troponin I 0.17 (*)    All other components within normal limits  BRAIN NATRIURETIC PEPTIDE -  Abnormal; Notable for the following:    B Natriuretic Peptide 592.0 (*)    All other components within normal limits   ____________________________________________  EKG  Reviewed and interpreted at 8:35 AM Heart rate 135 QRS 150 QTc 450 Atrial fibrillation, right bundle branch block, T-wave abnormality including depressions noted in lateral precordial leads concerning for possible ischemia  As compared to his crease EKG from only 6 days ago, I do not see any significant new change. The patient's EKG from today was discussed and reviewed with Dr. Arnoldo Hooker of cardiology at 8:40AM, he recommends diltiazem IV with oral Lopressor. ____________________________________________  RADIOLOGY  Dg Chest Portable 1 View  Result Date: 07/21/2016 CLINICAL DATA:  Chest pain. EXAM: PORTABLE CHEST 1 VIEW COMPARISON:  07/17/2016. FINDINGS: Mediastinum hilar structures are normal. Lungs are clear. Cardiomegaly with normal pulmonary vascularity. Small left pleural effusion cannot be excluded. No pneumothorax . IMPRESSION: 1. Cardiomegaly.  No pulmonary venous congestion. 2. Small left pleural effusion cannot be excluded . Electronically Signed   By: Maisie Fus  Register    On: 07/21/2016 09:11    ____________________________________________   PROCEDURES  Procedure(s) performed: None  Procedures  Critical Care performed: Yes, see critical care note(s)  CRITICAL CARE Performed by: Sharyn Creamer   Total critical care time: 35 minutes  Critical care time was exclusive of separately billable procedures and treating other patients.  Critical care was necessary to treat or prevent imminent or life-threatening deterioration.  Critical care was time spent personally by me on the following activities: development of treatment plan with patient and/or surrogate as well as nursing, discussions with consultants, evaluation of patient's response to treatment, examination of patient, obtaining history from patient or surrogate, ordering and performing treatments and interventions, ordering and review of laboratory studies, ordering and review of radiographic studies, pulse oximetry and re-evaluation of patient's condition.  Multiple doses of IV antiarrhythmic for chest pain and rate control. Patient high risk for cardiac decompensation based on presentation. ____________________________________________   INITIAL IMPRESSION / ASSESSMENT AND PLAN / ED COURSE  Pertinent labs & imaging results that were available during my care of the patient were reviewed by me and considered in my medical decision making (see chart for details).  Chest pain, A. fib with rapid ventricular response. Chest pain resolved after rate control, morphine and aspirin. Troponin is elevated and I'm concern for angina related to his rate, or myocardial ischemic event  Discussed case with cardiology. Patient will be admitted for ongoing evaluation. Presently pain-free, resting comfortably.  Clinical Course  Comment By Time  Awaiting labs. Patient reports improvement. Blood pressure improving as well. We'll give smallest Lasix, given the patient's recent history of leg edema I suspect he may  be in mild CHF though not evident by x-ray at this time. Sharyn Creamer, MD 10/20 [phone]     ____________________________________________   FINAL CLINICAL IMPRESSION(S) / ED DIAGNOSES  Final diagnoses:  Chest pain due to myocardial ischemia, unspecified ischemic chest pain type [facility])  Atrial fibrillation with rapid ventricular response (HCC)  Subtherapeutic international normalized ratio (INR)  Congenital heart failure (HCC)      NEW MEDICATIONS STARTED DURING THIS VISIT:  New Prescriptions   No medications on file     Note:  This document was prepared using Dragon voice recognition software and may include unintentional dictation errors.     Sharyn Creamer, MD 07/21/16 1018

## 2016-07-21 NOTE — ED Notes (Signed)
The EKG was completed and signed by Dr. Shaune PollackLord. The EKG was also exported to the system.

## 2016-07-21 NOTE — Progress Notes (Signed)
Initial Nutrition Assessment  DOCUMENTATION CODES:   Not applicable  INTERVENTION:   -Glucerna Shake po BID, each supplement provides 220 kcal and 10 grams of protein  NUTRITION DIAGNOSIS:   Inadequate oral intake related to poor appetite as evidenced by per patient/family report.  GOAL:   Patient will meet greater than or equal to 90% of their needs  MONITOR:   PO intake, Supplement acceptance, Labs, Skin, I & O's  REASON FOR ASSESSMENT:   Malnutrition Screening Tool    ASSESSMENT:   Juan Roberson  is a 45 y.o. male presents with chest pain 2 episodes. The first episode happened yesterday and he vomited 4 or 5 times. Second episode happened this morning around 7:30 AM. He developed chest pain 10 out of 10 intensity left chest. Relieved with morphine and aspirin in the ER. He also vomited 6 or 7 times. He states that he's been coughing and short of breath. Recently develop a blister on the ankle. No episodes of diarrhea. Having chills but no fever. Does feel fatigued. In the ER was found to be hypertensive and a rapid heart rate. Also had episode of atrial fibrillation during this episode.  Pt admitted with acute bronchitis and a-fib with RVR.   Case discussed with RN, who reports pt had consumed outside food prior to floor transfer and reported a 25# wt loss.   Spoke with pt at bedside. Pt receiving breathing treatment at time of visit, so interview was limited to close ended questions. Pt reported hx of distant weight loss, but denies any weight loss within the past 2-3 months. Per wt hx, wt has been stable over the past 6 months. Per previous RD notes, pt experienced progressive wt loss in June 2016 secondary to a work accident.   Pt reports poor appetite PTA. Pt has not received meal since floor admission, however, noted bag of [employer] food on tray table. However, pt reported that he did not consume it.   Nutrition-Focused physical exam completed. Findings are no fat  depletion, no muscle depletion, and no edema.   Discussed importance of good meal completion to prevent further weight loss. Pt reports he has tried nutrition supplements during previous hospitalizations and is amenable to them to improve PO intake.   Labs reviewed: CBGS: 171.   Diet Order:  Diet heart healthy/carb modified Room service appropriate? Yes; Fluid consistency: Thin  Skin:   (blister rt ankle)  Last BM:  PTA  Height:   Ht Readings from Last 1 Encounters:  07/21/16 6\' 2"  (1.88 m)    Weight:   Wt Readings from Last 1 Encounters:  07/21/16 186 lb (84.4 kg)    Ideal Body Weight:  86.4 kg  BMI:  Body mass index is 23.88 kg/m.  Estimated Nutritional Needs:   Kcal:  2100-2300  Protein:  100-115 grams  Fluid:  2.1-2.3 L  EDUCATION NEEDS:   Education needs addressed  Joeanna Howdyshell A. Mayford KnifeWilliams, RD, LDN, CDE Pager: [phone] After hours Pager: [phone]

## 2016-07-21 NOTE — H&P (Signed)
[facility] [place] at [facility]   PATIENT NAME: [name]    MR#:  [id number]  DATE OF BIRTH:  [birth year]  DATE OF ADMISSION:  07/21/2016  PRIMARY CARE PHYSICIAN: Corky Downs, MD   REQUESTING/REFERRING PHYSICIAN: Dr Sharyn Creamer  CHIEF COMPLAINT:   Chief Complaint  Patient presents with  . Chest Pain    HISTORY OF PRESENT ILLNESS:  Juan Roberson  is a 45 y.o. male presents with chest pain 2 episodes. The first episode happened yesterday and he vomited 4 or 5 times. Second episode happened this morning around 7:30 AM. He developed chest pain 10 out of 10 intensity left chest. Relieved with morphine and aspirin in the ER. He also vomited 6 or 7 times. He states that he's been coughing and short of breath. Recently develop a blister on the ankle. No episodes of diarrhea. Having chills but no fever. Does feel fatigued. In the ER was found to be hypertensive and a rapid heart rate. Also had episode of atrial fibrillation during this episode.  PAST MEDICAL HISTORY:   Past Medical History:  Diagnosis Date  . Cellulitis and abscess of foot    Left-Dr. Ether Griffins  . CKD (chronic kidney disease), stage III   . Diabetes mellitus without complication (HCC)    a. Dx ~ 1996.  . Essential hypertension   . Gastritis    a. 04/2015 hematemesis -> EGD: gastritis, esophagitis, duodenitis.  No active bleeding.  PPI added.  [name] GERD (gastroesophageal reflux disease)   . Left leg DVT (HCC)    a. Dx 05/2015 -> Coumadin.  . MI (myocardial infarction)   . Osteomyelitis (HCC)    a. 05/2015 L foot.    PAST SURGICAL HISTORY:   Past Surgical History:  Procedure Laterality Date  . ESOPHAGOGASTRODUODENOSCOPY N/A 04/07/2015   Procedure: ESOPHAGOGASTRODUODENOSCOPY (EGD);  Surgeon: Midge Minium, MD;  Location[facility] ENDOSCOPY;  Service: Endoscopy;  Laterality: N/A;  . ESOPHAGOGASTRODUODENOSCOPY (EGD) WITH PROPOFOL Left 06/30/2015   Procedure: ESOPHAGOGASTRODUODENOSCOPY (EGD) WITH  PROPOFOL;  Surgeon: Christena Deem, MD;  Location: [facility] ENDOSCOPY;  Service: Endoscopy;  Laterality: Left;  . FOOT SURGERY    . I&D EXTREMITY Left 04/06/2015   Procedure: IRRIGATION AND DEBRIDEMENT EXTREMITY;  Surgeon: Gwyneth Revels, DPM;  Location: [facility] ORS;  Service: Podiatry;  Laterality: Left;  . IRRIGATION AND DEBRIDEMENT FOOT Left 05/21/2015   Procedure: IRRIGATION AND DEBRIDEMENT FOOT;  Surgeon: Linus Galas, MD;  Location: [facility] ORS;  Service: Podiatry;  Laterality: Left;    SOCIAL HISTORY:   Social History  Substance Use Topics  . Smoking status: Never Smoker  . Smokeless tobacco: Never Used  . Alcohol use No    FAMILY HISTORY:   Family History  Problem Relation Age of Onset  . Coronary artery disease Father   . Pancreatic cancer Mother   . Breast cancer Sister   . Lung cancer Brother   . Cervical cancer Sister     DRUG ALLERGIES:   Allergies  Allergen Reactions  . Sulfur Other (See Comments)    Pt states that this medication causes renal failure.      REVIEW OF SYSTEMS:  CONSTITUTIONAL: No fever, Positive for chills. Positive for weight loss. Positive for fatigue.  EYES: Positive for blurred vision.  EARS, NOSE, AND THROAT: No tinnitus or ear pain. No sore throat RESPIRATORY: Positive for cough and shortness of breath. No wheezing or hemoptysis.  CARDIOVASCULAR: Positive for chest pain.  GASTROINTESTINAL: Positive for nausea and vomiting. No diarrhea or abdominal  pain. No blood in bowel movements GENITOURINARY: No dysuria, hematuria.  ENDOCRINE: No polyuria, nocturia,  HEMATOLOGY: No anemia, easy bruising or bleeding SKIN: Positive rash on the back and chest. MUSCULOSKELETAL: No joint pain or arthritis.   NEUROLOGIC: No tingling, numbness, weakness.  PSYCHIATRY: No anxiety or depression.   MEDICATIONS AT HOME:   Prior to Admission medications   Medication Sig Start Date End Date Taking? Authorizing Provider  carvedilol (COREG) 6.25 MG tablet Take 1 tablet  by mouth 2 (two) times daily. 11/19/15  Yes Historical Provider, MD  cloNIDine (CATAPRES - DOSED IN MG/24 HR) 0.3 mg/24hr patch Place 0.3 mg onto the skin once a week.   Yes Historical Provider, MD  cloNIDine (CATAPRES) 0.2 MG tablet Take 1 tablet (0.2 mg total) by mouth 3 (three) times daily. Patient taking differently: Take 0.2 mg by mouth 2 (two) times daily.  11/03/15  Yes Srikar Sudini, MD  furosemide (LASIX) 40 MG tablet Take 40 mg by mouth daily. 07/10/16  Yes Historical Provider, MD  TOUJEO SOLOSTAR 300 UNIT/ML SOPN Inject 25 Units into the skin every morning. 07/03/16  Yes Historical Provider, MD  B-D ULTRAFINE III SHORT PEN 31G X 8 MM MISC Inject 1 pen into the skin daily. 01/10/16   Historical Provider, MD      VITAL SIGNS:  Blood pressure 139/83, pulse 91, resp. rate 15, height 6\' 2"  (1.88 m), weight 84.4 kg (186 lb), SpO2 98 %.  PHYSICAL EXAMINATION:  GENERAL:  45 y.o.-year-old patient lying in the bed with no acute distress.  EYES: Pupils equal, round, reactive to light and accommodation. No scleral icterus. Extraocular muscles intact.  HEENT: Head atraumatic, normocephalic. Oropharynx and nasopharynx clear.  NECK:  Supple, no jugular venous distention. No thyroid enlargement, no tenderness.  LUNGS: Decreased breath sounds bilaterally, no wheezing. Positive rhonchi bilateral bases No use of accessory muscles of respiration.  CARDIOVASCULAR: S1, S2 normal. No murmurs, rubs, or gallops.  ABDOMEN: Soft, nontender, nondistended. Bowel sounds present. No organomegaly or mass.  EXTREMITIES: No pedal edema, cyanosis, or clubbing.  NEUROLOGIC: Cranial nerves II through XII are intact. Muscle strength 5/5 in all extremities. Sensation intact. Gait not checked.  PSYCHIATRIC: The patient is alert and oriented x 3.  SKIN: Maculopapular rash chest and back. Small ulceration that's healing on the right ankle.  LABORATORY PANEL:   CBC  Recent Labs Lab 07/21/16 0845  WBC 8.1  HGB 10.9*   HCT 33.2*  PLT 156   ------------------------------------------------------------------------------------------------------------------  Chemistries   Recent Labs Lab 07/21/16 0845  NA 140  K 4.2  CL 106  CO2 20*  GLUCOSE 245*  BUN 61*  CREATININE 2.64*  CALCIUM 9.8   ------------------------------------------------------------------------------------------------------------------  Cardiac Enzymes  Recent Labs Lab 07/21/16 0845  TROPONINI 0.17*   ------------------------------------------------------------------------------------------------------------------  RADIOLOGY:  Dg Chest Portable 1 View  Result Date: 07/21/2016 CLINICAL DATA:  Chest pain. EXAM: PORTABLE CHEST 1 VIEW COMPARISON:  07/17/2016. FINDINGS: Mediastinum hilar structures are normal. Lungs are clear. Cardiomegaly with normal pulmonary vascularity. Small left pleural effusion cannot be excluded. No pneumothorax . IMPRESSION: 1. Cardiomegaly.  No pulmonary venous congestion. 2. Small left pleural effusion cannot be excluded . Electronically Signed   By: Maisie Fus  Register   On: 07/21/2016 09:11    EKG:   Sinus tachycardia 135 bpm, right bundle branch block, left anterior fascicular block  IMPRESSION AND PLAN:   1. Acute bronchitis. I will give nebulizer treatments and antibiotics. 2. Brief rapid atrial fibrillation. Restart his Coreg and continue to  monitor. Aspirin. 3. Chest pain with borderline troponin, history of CAD. This could be a false positive from chronic kidney disease or demand ischemia with respiratory symptoms. Serial troponins and cardiology consultation. 4. History of congestive heart failure, cardiomyopathy with EF of 35-40% with moderate mitral regurgitation on last echocardiogram. Patient was given 1 dose of Lasix in the ER. Change back to oral Lasix for tomorrow. 5. Chronic kidney disease stage IV continue to monitor with diuresis. 6. Nausea and vomiting. When necessary Zofran 7.  Type 2 diabetes mellitus. Sliding scale and continue his insulin. 8. Skin rash. Continue to monitor. Triamcinolone cream.   All the records are reviewed and case discussed with ED provider. Management plans discussed with the patient, family and they are in agreement.  CODE STATUS: Full code  TOTAL TIME TAKING CARE OF THIS PATIENT: 50 minutes.    Alford HighlandWIETING, Ange Puskas M.D on 07/21/2016 at 11:06 AM  Between 7am to 6pm - Pager - [phone]  After 6pm call admission pager [phone]  [facility] Office  [phone]  CC: Primary care physician; Corky DownsMASOUD,JAVED, MD

## 2016-07-22 LAB — GLUCOSE, CAPILLARY
GLUCOSE-CAPILLARY: 327 mg/dL — AB (ref 65–99)
Glucose-Capillary: 99 mg/dL (ref 65–99)

## 2016-07-22 LAB — CBC
HCT: 28.9 % — ABNORMAL LOW (ref 40.0–52.0)
Hemoglobin: 9.5 g/dL — ABNORMAL LOW (ref 13.0–18.0)
MCH: 28 pg (ref 26.0–34.0)
MCHC: 32.7 g/dL (ref 32.0–36.0)
MCV: 85.6 fL (ref 80.0–100.0)
Platelets: 103 K/uL — ABNORMAL LOW (ref 150–440)
RBC: 3.38 MIL/uL — ABNORMAL LOW (ref 4.40–5.90)
RDW: 15.1 % — ABNORMAL HIGH (ref 11.5–14.5)
WBC: 3.5 K/uL — ABNORMAL LOW (ref 3.8–10.6)

## 2016-07-22 LAB — BASIC METABOLIC PANEL
ANION GAP: 8 (ref 5–15)
BUN: 67 mg/dL — ABNORMAL HIGH (ref 6–20)
CO2: 25 mmol/L (ref 22–32)
Calcium: 9 mg/dL (ref 8.9–10.3)
Chloride: 107 mmol/L (ref 101–111)
Creatinine, Ser: 2.95 mg/dL — ABNORMAL HIGH (ref 0.61–1.24)
GFR calc Af Amer: 28 mL/min — ABNORMAL LOW (ref >60)
GFR, EST NON AFRICAN AMERICAN: 24 mL/min — AB (ref >60)
GLUCOSE: 94 mg/dL (ref 65–99)
POTASSIUM: 4.4 mmol/L (ref 3.5–5.1)
Sodium: 140 mmol/L (ref 135–145)

## 2016-07-22 LAB — T4, FREE: Free T4: 1.8 ng/dL — ABNORMAL HIGH (ref 0.61–1.12)

## 2016-07-22 MED ORDER — ASPIRIN 81 MG PO TBEC: 81.0000 mg | DELAYED_RELEASE_TABLET | Freq: Every day | ORAL | 0 refills | Status: DC

## 2016-07-22 MED ORDER — AMIODARONE HCL 400 MG PO TABS: 400.0000 mg | ORAL_TABLET | Freq: Every day | ORAL | 0 refills | Status: DC

## 2016-07-22 MED ORDER — METHIMAZOLE 10 MG PO TABS
10.0000 mg | ORAL_TABLET | Freq: Every day | ORAL | Status: DC
  Filled 2016-07-22: qty 1

## 2016-07-22 MED ORDER — TRIAMCINOLONE ACETONIDE 0.1 % EX CREA: TOPICAL_CREAM | CUTANEOUS | 0 refills | Status: DC

## 2016-07-22 MED ORDER — GLUCERNA SHAKE PO LIQD: 237.0000 mL | Freq: Two times a day (BID) | ORAL | 0 refills | Status: DC

## 2016-07-22 MED ORDER — METHIMAZOLE 10 MG PO TABS: 10.0000 mg | ORAL_TABLET | Freq: Every day | ORAL | 0 refills | Status: DC

## 2016-07-22 MED ORDER — AZITHROMYCIN 250 MG PO TABS: ORAL_TABLET | ORAL | 0 refills | Status: DC

## 2016-07-22 MED ORDER — CLONIDINE HCL 0.2 MG PO TABS: 0.2000 mg | ORAL_TABLET | Freq: Two times a day (BID) | ORAL | Status: DC

## 2016-07-22 NOTE — Discharge Instructions (Signed)

## 2016-07-22 NOTE — Progress Notes (Signed)
SUBJECTIVE: Patient denies any chest pain   Vitals:   07/21/16 2014 07/21/16 2112 07/22/16 0532 07/22/16 0903  BP:  119/63 118/72 134/67  Pulse:  94 81 85  Resp:  18 16 18   Temp:  98.4 F (36.9 C) 97.6 F (36.4 C) 98.4 F (36.9 C)  TempSrc:  Oral Oral Oral  SpO2: 98% 97% 100% 100%  Weight:      Height:        Intake/Output Summary (Last 24 hours) at 07/22/16 1046 Last data filed at 07/22/16 1015  Gross per 24 hour  Intake                0 ml  Output              700 ml  Net             -700 ml    LABS: Basic Metabolic Panel:  Recent Labs  40/98/1110/20/17 0845 07/22/16 0643  NA 140 140  K 4.2 4.4  CL 106 107  CO2 20* 25  GLUCOSE 245* 94  BUN 61* 67*  CREATININE 2.64* 2.95*  CALCIUM 9.8 9.0   Liver Function Tests: No results for input(s): AST, ALT, ALKPHOS, BILITOT, PROT, ALBUMIN in the last 72 hours. No results for input(s): LIPASE, AMYLASE in the last 72 hours. CBC:  Recent Labs  07/21/16 0845 07/22/16 0643  WBC 8.1 3.5*  HGB 10.9* 9.5*  HCT 33.2* 28.9*  MCV 85.6 85.6  PLT 156 103*   Cardiac Enzymes:  Recent Labs  07/21/16 0845 07/21/16 1336 07/21/16 1657  TROPONINI 0.17* 0.87* 0.86*   BNP: Invalid input(s): POCBNP D-Dimer: No results for input(s): DDIMER in the last 72 hours. Hemoglobin A1C: No results for input(s): HGBA1C in the last 72 hours. Fasting Lipid Panel: No results for input(s): CHOL, HDL, LDLCALC, TRIG, CHOLHDL, LDLDIRECT in the last 72 hours. Thyroid Function Tests:  Recent Labs  07/21/16 1336  TSH <0.010*   Anemia Panel: No results for input(s): VITAMINB12, FOLATE, FERRITIN, TIBC, IRON, RETICCTPCT in the last 72 hours.   PHYSICAL EXAM General: Well developed, well nourished, in no acute distress HEENT:  Normocephalic and atramatic Neck:  No JVD.  Lungs: Clear bilaterally to auscultation and percussion. Heart: HRRR . Normal S1 and S2 without gallops or murmurs.  Abdomen: Bowel sounds are positive, abdomen soft and  non-tender  Msk:  Back normal, normal gait. Normal strength and tone for age. Extremities: No clubbing, cyanosis or edema.   Neuro: Alert and oriented X 3. Psych:  Good affect, responds appropriately  TELEMETRY:A. fib with controlled ventricular rate  ASSESSMENT AND PLAN: Atrial fibrillation with controlled ventricular rate and mildly elevated troponin and renal insufficiency. Patient has no further chest pain and can go home on 400 mg by mouth amiodarone once a day. We will get follow-up with me on Tuesday at 10:00.  Active Problems:   Acute bronchitis    Skylan Lara A, MD, [facility] 07/22/2016 10:46 AM

## 2016-07-23 LAB — T3, FREE: T3 FREE: 7.2 pg/mL — AB (ref 2.0–4.4)

## 2016-07-23 NOTE — Discharge Summary (Signed)
[facility] - [place] at [facility]   PATIENT NAME: Juan Roberson    MR#:  [id number]  DATE OF BIRTH:  03/08/1971  DATE OF ADMISSION:  07/21/2016 ADMITTING PHYSICIAN: Alford Highlandichard Kelen Laura, MD  DATE OF DISCHARGE: 07/22/2016 12:05 PM  PRIMARY CARE PHYSICIAN: MASOUD,JAVED, MD    ADMISSION DIAGNOSIS:  Atrial fibrillation with rapid ventricular response (HCC) [I48.91] Subtherapeutic international normalized ratio (INR) [R79.1] Congenital heart failure (HCC) [I50.9] Chest pain due to myocardial ischemia, unspecified ischemic chest pain type (HCC) [I20.9]  DISCHARGE DIAGNOSIS:  Active Problems:   Acute bronchitis   SECONDARY DIAGNOSIS:   Past Medical History:  Diagnosis Date  . Cellulitis and abscess of foot    Left-Dr. Ether GriffinsFowler  . CKD (chronic kidney disease), stage III   . Diabetes mellitus without complication (HCC)    a. Dx ~ 1996.  . Essential hypertension   . Gastritis    a. 04/2015 hematemesis -> EGD: gastritis, esophagitis, duodenitis.  No active bleeding.  PPI added.  [name]. GERD (gastroesophageal reflux disease)   . Left leg DVT (HCC)    a. Dx 05/2015 -> Coumadin.  . MI (myocardial infarction)   . Osteomyelitis (HCC)    a. 05/2015 L foot.    HOSPITAL COURSE:   1. Acute bronchitis. Patient feeling much better upon discharge. Lungs are clear. Finish up Zithromax course. 2. Rapid atrial fibrillation. Dr. Cain SaupeShaukut Khan cardiology consulted and started amiodarone. I continued Coreg and I gave aspirin. Patient will follow-up with cardiology as outpatient. 3. Hyperthyroidism. I started methimazole 10 mg daily. Follow-up with endocrinology as outpatient. TSH very low and T3 and T4 sent off prior to discharge. 4. Chest pain with borderline troponin with history of coronary artery disease. Likely a false positive from chronic kidney disease. Demand ischemia from rapid atrial fibrillation. 5. History of congestive heart failure with cardiomyopathy EF of 35-40% with moderate  mitral regurgitation. Patient was given 1 dose of Lasix in the ER and change back to his usual Lasix upon discharge. 6. Chronic kidney disease stage IV. Continue to monitor with diuresis 7. Nausea and vomiting. This has resolved 8. Type 2 diabetes mellitus. Can go back on his usual medications as outpatient 9. Skin rash. Continue triamcinolone cream 10. Pancytopenia. Recommend checking CBC as outpatient   DISCHARGE CONDITIONS:   Satisfactory  CONSULTS OBTAINED:  Treatment Team:  Laurier NancyShaukat A Khan, MD  DRUG ALLERGIES:   Allergies  Allergen Reactions  . Sulfur Other (See Comments)    Pt states that this medication causes renal failure.      DISCHARGE MEDICATIONS:   Discharge Medication List as of 07/22/2016 11:41 AM    START taking these medications   Details  amiodarone (PACERONE) 400 MG tablet Take 1 tablet (400 mg total) by mouth daily., Starting Sat 07/22/2016, Print    aspirin EC 81 MG EC tablet Take 1 tablet (81 mg total) by mouth daily., Starting Sun 07/23/2016, Print    azithromycin (ZITHROMAX) 250 MG tablet One tablet daily for three more doses, Print    feeding supplement, GLUCERNA SHAKE, (GLUCERNA SHAKE) LIQD Take 237 mLs by mouth 2 (two) times daily between meals., Starting Sat 07/22/2016, Print    methimazole (TAPAZOLE) 10 MG tablet Take 1 tablet (10 mg total) by mouth daily., Starting Sat 07/22/2016, Print    triamcinolone cream (KENALOG) 0.1 % Apply twice a day to skin rash, No Print      CONTINUE these medications which have CHANGED   Details  cloNIDine (CATAPRES) 0.2 MG tablet  Take 1 tablet (0.2 mg total) by mouth 2 (two) times daily., Starting Sat 07/22/2016, No Print      CONTINUE these medications which have NOT CHANGED   Details  carvedilol (COREG) 6.25 MG tablet Take 1 tablet by mouth 2 (two) times daily., Starting Fri 11/19/2015, Historical Med    cloNIDine (CATAPRES - DOSED IN MG/24 HR) 0.3 mg/24hr patch Place 0.3 mg onto the skin once a week.,  Historical Med    furosemide (LASIX) 40 MG tablet Take 40 mg by mouth daily., Starting Mon 07/10/2016, Historical Med    TOUJEO SOLOSTAR 300 UNIT/ML SOPN Inject 25 Units into the skin every morning., Starting Mon 07/03/2016, Historical Med    B-D ULTRAFINE III SHORT PEN 31G X 8 MM MISC Inject 1 pen into the skin daily., Starting Mon 01/10/2016, Historical Med         DISCHARGE INSTRUCTIONS:   Follow-up with Dr. Welton Flakes cardiology as scheduled Follow-up with PMD one week Follow-up with Dr. Tedd Sias endocrinology 2-3 weeks  If you experience worsening of your admission symptoms, develop shortness of breath, life threatening emergency, suicidal or homicidal thoughts you must seek medical attention immediately by calling 911 or calling your MD immediately  if symptoms less severe.  You Must read complete instructions/literature along with all the possible adverse reactions/side effects for all the Medicines you take and that have been prescribed to you. Take any new Medicines after you have completely understood and accept all the possible adverse reactions/side effects.   Please note  You were cared for by a hospitalist during your hospital stay. If you have any questions about your discharge medications or the care you received while you were in the hospital after you are discharged, you can call the unit and asked to speak with the hospitalist on call if the hospitalist that took care of you is not available. Once you are discharged, your primary care physician will handle any further medical issues. Please note that NO REFILLS for any discharge medications will be authorized once you are discharged, as it is imperative that you return to your primary care physician (or establish a relationship with a primary care physician if you do not have one) for your aftercare needs so that they can reassess your need for medications and monitor your lab values.    Today   CHIEF COMPLAINT:   Chief  Complaint  Patient presents with  . Chest Pain    HISTORY OF PRESENT ILLNESS:  Juan Roberson  is a 45 y.o. male presented with chest pain and shortness of breath   VITAL SIGNS:  Blood pressure (!) 106/57, pulse 71, temperature 98.2 F (36.8 C), temperature source Oral, resp. rate 18, height 6\' 2"  (1.88 m), weight 84.4 kg (186 lb), SpO2 99 %.  I/O:  No intake or output data in the 24 hours ending 07/23/16 1503  PHYSICAL EXAMINATION:  GENERAL:  45 y.o.-year-old patient lying in the bed with no acute distress.  EYES: Pupils equal, round, reactive to light and accommodation. No scleral icterus. Extraocular muscles intact.  HEENT: Head atraumatic, normocephalic. Oropharynx and nasopharynx clear.  NECK:  Supple, no jugular venous distention. No thyroid enlargement And no thyroid  tenderness.  LUNGS: Normal breath sounds bilaterally, no wheezing, rales,rhonchi or crepitation. No use of accessory muscles of respiration.  CARDIOVASCULAR: S1, S2 Irregularly irregular. No murmurs, rubs, or gallops.  ABDOMEN: Soft, non-tender, non-distended. Bowel sounds present. No organomegaly or mass.  EXTREMITIES: No pedal edema, cyanosis, or clubbing.  NEUROLOGIC:  Cranial nerves II through XII are intact. Muscle strength 5/5 in all extremities. Sensation intact. Gait not checked.  PSYCHIATRIC: The patient is alert and oriented x 3.  SKIN: Small skin tear right ankle  DATA REVIEW:   CBC  Recent Labs Lab 07/22/16 0643  WBC 3.5*  HGB 9.5*  HCT 28.9*  PLT 103*    Chemistries   Recent Labs Lab 07/22/16 0643  NA 140  K 4.4  CL 107  CO2 25  GLUCOSE 94  BUN 67*  CREATININE 2.95*  CALCIUM 9.0    Cardiac Enzymes  Recent Labs Lab 07/21/16 1657  TROPONINI 0.86*    Microbiology Results  Results for orders placed or performed during the hospital encounter of 07/21/16  MRSA PCR Screening     Status: Abnormal   Collection Time: 07/21/16  3:43 PM  Result Value Ref Range Status   MRSA by  PCR POSITIVE (A) NEGATIVE Final    Comment:        The GeneXpert MRSA Assay (FDA approved for NASAL specimens only), is one component of a comprehensive MRSA colonization surveillance program. It is not intended to diagnose MRSA infection nor to guide or monitor treatment for MRSA infections. RESULT CALLED TO, READ BACK BY AND VERIFIED WITH: MISSY EDEYR 07/21/16 1753 SGD      Management plans discussed with the patient, And he is  in agreement.  CODE STATUS:  Code Status History    Date Active Date Inactive Code Status Order ID Comments User Context   07/21/2016 11:05 AM 07/22/2016  3:11 PM Full Code [id number]  Alford Highland, MD ED   02/28/2016  1:43 AM 03/01/2016  6:06 PM Full Code [id number]  Arnaldo Natal, MD Inpatient   12/06/2015  8:39 PM 12/08/2015  2:51 PM Full Code [id number]  Altamese Dilling, MD Inpatient   11/01/2015  4:00 PM 11/03/2015  2:28 PM Full Code [id number]  Wyatt Haste, MD ED   06/29/2015  2:30 AM 07/01/2015  2:21 PM Full Code [id number]  Arnaldo Natal, MD Inpatient   05/21/2015  4:34 PM 05/25/2015  4:34 PM Full Code [id number]  Linus Galas, MD Inpatient   05/18/2015  9:17 PM 05/21/2015  4:34 PM Full Code [id number]  Shaune Pollack, MD Inpatient   04/05/2015  7:05 AM 04/08/2015  2:00 PM Full Code [id number]  Arnaldo Natal, MD Inpatient    Advance Directive Documentation   Flowsheet Row Most Recent Value  Type of Advance Directive  Living will  Pre-existing out of facility DNR order (yellow form or pink MOST form)  No data  "MOST" Form in Place?  No data      TOTAL TIME TAKING CARE OF THIS PATIENT: 35  minutes.    Alford Highland M.D on 07/23/2016 at 3:03 PM  Between 7am to 6pm - Pager - [phone]  After 6pm go to [email or web address] - password [email or web address]  [facility] Office  [phone]  CC: Primary care physician; Corky Downs, MD

## 2016-09-24 ENCOUNTER — Emergency Department: Payer: BLUE CROSS/BLUE SHIELD

## 2016-09-24 ENCOUNTER — Inpatient Hospital Stay
Admission: EM | Admit: 2016-09-24 | Discharge: 2016-09-25 | DRG: 313 | Disposition: A | Discharge status: Home / Self Care | Payer: BLUE CROSS/BLUE SHIELD | Attending: Internal Medicine | Admitting: Internal Medicine

## 2016-09-24 ENCOUNTER — Encounter: Payer: Self-pay | Admitting: Intensive Care

## 2016-09-24 DIAGNOSIS — I160 Hypertensive urgency: Secondary | ICD-10-CM | POA: Diagnosis present

## 2016-09-24 DIAGNOSIS — Z801 Family history of malignant neoplasm of trachea, bronchus and lung: Secondary | ICD-10-CM

## 2016-09-24 DIAGNOSIS — Z8249 Family history of ischemic heart disease and other diseases of the circulatory system: Secondary | ICD-10-CM

## 2016-09-24 DIAGNOSIS — N183 Chronic kidney disease, stage 3 (moderate): Secondary | ICD-10-CM | POA: Diagnosis present

## 2016-09-24 DIAGNOSIS — E785 Hyperlipidemia, unspecified: Secondary | ICD-10-CM | POA: Diagnosis present

## 2016-09-24 DIAGNOSIS — K219 Gastro-esophageal reflux disease without esophagitis: Secondary | ICD-10-CM | POA: Diagnosis present

## 2016-09-24 DIAGNOSIS — Z79899 Other long term (current) drug therapy: Secondary | ICD-10-CM | POA: Diagnosis not present

## 2016-09-24 DIAGNOSIS — I129 Hypertensive chronic kidney disease with stage 1 through stage 4 chronic kidney disease, or unspecified chronic kidney disease: Secondary | ICD-10-CM | POA: Diagnosis present

## 2016-09-24 DIAGNOSIS — I10 Essential (primary) hypertension: Secondary | ICD-10-CM | POA: Diagnosis present

## 2016-09-24 DIAGNOSIS — R112 Nausea with vomiting, unspecified: Secondary | ICD-10-CM | POA: Diagnosis present

## 2016-09-24 DIAGNOSIS — R0789 Other chest pain: Secondary | ICD-10-CM | POA: Diagnosis not present

## 2016-09-24 DIAGNOSIS — Z7982 Long term (current) use of aspirin: Secondary | ICD-10-CM

## 2016-09-24 DIAGNOSIS — Z7901 Long term (current) use of anticoagulants: Secondary | ICD-10-CM | POA: Diagnosis not present

## 2016-09-24 DIAGNOSIS — E875 Hyperkalemia: Secondary | ICD-10-CM | POA: Diagnosis present

## 2016-09-24 DIAGNOSIS — R1013 Epigastric pain: Secondary | ICD-10-CM | POA: Diagnosis not present

## 2016-09-24 DIAGNOSIS — Z803 Family history of malignant neoplasm of breast: Secondary | ICD-10-CM

## 2016-09-24 DIAGNOSIS — I4510 Unspecified right bundle-branch block: Secondary | ICD-10-CM | POA: Diagnosis present

## 2016-09-24 DIAGNOSIS — N179 Acute kidney failure, unspecified: Secondary | ICD-10-CM | POA: Diagnosis present

## 2016-09-24 DIAGNOSIS — R000 Tachycardia, unspecified: Secondary | ICD-10-CM

## 2016-09-24 DIAGNOSIS — Z8049 Family history of malignant neoplasm of other genital organs: Secondary | ICD-10-CM | POA: Diagnosis not present

## 2016-09-24 DIAGNOSIS — I480 Paroxysmal atrial fibrillation: Secondary | ICD-10-CM | POA: Diagnosis present

## 2016-09-24 DIAGNOSIS — Z86718 Personal history of other venous thrombosis and embolism: Secondary | ICD-10-CM | POA: Diagnosis not present

## 2016-09-24 DIAGNOSIS — I252 Old myocardial infarction: Secondary | ICD-10-CM | POA: Diagnosis not present

## 2016-09-24 DIAGNOSIS — R791 Abnormal coagulation profile: Secondary | ICD-10-CM | POA: Diagnosis present

## 2016-09-24 DIAGNOSIS — R079 Chest pain, unspecified: Secondary | ICD-10-CM | POA: Diagnosis present

## 2016-09-24 DIAGNOSIS — E1122 Type 2 diabetes mellitus with diabetic chronic kidney disease: Secondary | ICD-10-CM | POA: Diagnosis present

## 2016-09-24 DIAGNOSIS — I2510 Atherosclerotic heart disease of native coronary artery without angina pectoris: Secondary | ICD-10-CM | POA: Diagnosis present

## 2016-09-24 DIAGNOSIS — Z800 Family history of malignant neoplasm of digestive organs: Secondary | ICD-10-CM | POA: Diagnosis not present

## 2016-09-24 DIAGNOSIS — N189 Chronic kidney disease, unspecified: Secondary | ICD-10-CM

## 2016-09-24 LAB — URINE DRUG SCREEN, QUALITATIVE (ARMC ONLY)
AMPHETAMINES, UR SCREEN: NOT DETECTED
Barbiturates, Ur Screen: NOT DETECTED
Benzodiazepine, Ur Scrn: NOT DETECTED
COCAINE METABOLITE, UR ~~LOC~~: NOT DETECTED
Cannabinoid 50 Ng, Ur ~~LOC~~: NOT DETECTED
MDMA (Ecstasy)Ur Screen: NOT DETECTED
METHADONE SCREEN, URINE: NOT DETECTED
OPIATE, UR SCREEN: NOT DETECTED
Phencyclidine (PCP) Ur S: NOT DETECTED
Tricyclic, Ur Screen: NOT DETECTED

## 2016-09-24 LAB — BASIC METABOLIC PANEL
ANION GAP: 15 (ref 5–15)
BUN: 67 mg/dL — ABNORMAL HIGH (ref 6–20)
CHLORIDE: 108 mmol/L (ref 101–111)
CO2: 15 mmol/L — AB (ref 22–32)
CREATININE: 3.28 mg/dL — AB (ref 0.61–1.24)
Calcium: 9.7 mg/dL (ref 8.9–10.3)
GFR calc non Af Amer: 21 mL/min — ABNORMAL LOW (ref >60)
GFR, EST AFRICAN AMERICAN: 24 mL/min — AB (ref >60)
Glucose, Bld: 198 mg/dL — ABNORMAL HIGH (ref 65–99)
POTASSIUM: 4.7 mmol/L (ref 3.5–5.1)
Sodium: 138 mmol/L (ref 135–145)

## 2016-09-24 LAB — GLUCOSE, CAPILLARY
GLUCOSE-CAPILLARY: 178 mg/dL — AB (ref 65–99)
Glucose-Capillary: 196 mg/dL — ABNORMAL HIGH (ref 65–99)

## 2016-09-24 LAB — TROPONIN I: Troponin I: 0.05 ng/mL (ref <0.03)

## 2016-09-24 LAB — HEPATIC FUNCTION PANEL
ALBUMIN: 3.5 g/dL (ref 3.5–5.0)
ALT: 17 U/L (ref 17–63)
AST: 37 U/L (ref 15–41)
Alkaline Phosphatase: 136 U/L — ABNORMAL HIGH (ref 38–126)
BILIRUBIN DIRECT: 0.7 mg/dL — AB (ref 0.1–0.5)
BILIRUBIN TOTAL: 1.5 mg/dL — AB (ref 0.3–1.2)
Indirect Bilirubin: 0.8 mg/dL (ref 0.3–0.9)
Total Protein: 7.6 g/dL (ref 6.5–8.1)

## 2016-09-24 LAB — LIPASE, BLOOD: Lipase: 21 U/L (ref 11–51)

## 2016-09-24 LAB — APTT: aPTT: 32 seconds (ref 24–36)

## 2016-09-24 LAB — CBC
HEMATOCRIT: 29.3 % — AB (ref 40.0–52.0)
HEMOGLOBIN: 9.5 g/dL — AB (ref 13.0–18.0)
MCH: 27.9 pg (ref 26.0–34.0)
MCHC: 32.5 g/dL (ref 32.0–36.0)
MCV: 85.6 fL (ref 80.0–100.0)
Platelets: 157 10*3/uL (ref 150–440)
RBC: 3.43 MIL/uL — AB (ref 4.40–5.90)
RDW: 15.5 % — ABNORMAL HIGH (ref 11.5–14.5)
WBC: 11.3 10*3/uL — ABNORMAL HIGH (ref 3.8–10.6)

## 2016-09-24 LAB — PROTIME-INR
INR: 1.68
Prothrombin Time: 20 seconds — ABNORMAL HIGH (ref 11.4–15.2)

## 2016-09-24 LAB — BRAIN NATRIURETIC PEPTIDE: B Natriuretic Peptide: 448 pg/mL — ABNORMAL HIGH (ref 0.0–100.0)

## 2016-09-24 LAB — HEPARIN LEVEL (UNFRACTIONATED): HEPARIN UNFRACTIONATED: 0.12 [IU]/mL — AB (ref 0.30–0.70)

## 2016-09-24 MED ORDER — HYDRALAZINE HCL 20 MG/ML IJ SOLN
10.0000 mg | INTRAMUSCULAR | Status: DC | PRN
  Administered 2016-09-24: 10 mg via INTRAVENOUS

## 2016-09-24 MED ORDER — WARFARIN - PHARMACIST DOSING INPATIENT
Freq: Every day | Status: DC
Start: 2016-09-24 — End: 2016-09-25
  Filled 2016-09-24 (×4): qty 1

## 2016-09-24 MED ORDER — WARFARIN SODIUM 2 MG PO TABS
2.0000 mg | ORAL_TABLET | Freq: Once | ORAL | Status: DC
  Filled 2016-09-24: qty 1

## 2016-09-24 MED ORDER — INSULIN GLARGINE 100 UNIT/ML ~~LOC~~ SOLN
25.0000 [IU] | SUBCUTANEOUS | Status: DC
  Administered 2016-09-25: 25 [IU] via SUBCUTANEOUS
  Filled 2016-09-24: qty 0.25

## 2016-09-24 MED ORDER — ASPIRIN EC 325 MG PO TBEC
325.0000 mg | DELAYED_RELEASE_TABLET | Freq: Every day | ORAL | Status: DC
  Administered 2016-09-25: 325 mg via ORAL
  Filled 2016-09-24: qty 1

## 2016-09-24 MED ORDER — CLONIDINE HCL 0.1 MG PO TABS
0.2000 mg | ORAL_TABLET | Freq: Two times a day (BID) | ORAL | Status: DC
  Administered 2016-09-24 – 2016-09-25 (×2): 0.2 mg via ORAL
  Filled 2016-09-24 (×2): qty 2

## 2016-09-24 MED ORDER — HYDRALAZINE HCL 20 MG/ML IJ SOLN
INTRAMUSCULAR | Status: AC
  Administered 2016-09-24: 10 mg via INTRAVENOUS
  Filled 2016-09-24: qty 1

## 2016-09-24 MED ORDER — OXYCODONE HCL 5 MG PO TABS: 5.0000 mg | ORAL_TABLET | ORAL | Status: DC | PRN

## 2016-09-24 MED ORDER — INSULIN GLARGINE 300 UNIT/ML ~~LOC~~ SOPN: 25.0000 [IU] | PEN_INJECTOR | SUBCUTANEOUS | Status: DC

## 2016-09-24 MED ORDER — HEPARIN (PORCINE) IN NACL 100-0.45 UNIT/ML-% IJ SOLN
1300.0000 [IU]/h | INTRAMUSCULAR | Status: DC
  Administered 2016-09-24: 1100 [IU]/h via INTRAVENOUS
  Administered 2016-09-25: 1300 [IU]/h via INTRAVENOUS
  Filled 2016-09-24 (×3): qty 250

## 2016-09-24 MED ORDER — WARFARIN SODIUM 1 MG PO TABS: 2.0000 mg | ORAL_TABLET | Freq: Every day | ORAL | Status: DC

## 2016-09-24 MED ORDER — ACETAMINOPHEN 325 MG PO TABS: 650.0000 mg | ORAL_TABLET | Freq: Four times a day (QID) | ORAL | Status: DC | PRN

## 2016-09-24 MED ORDER — INSULIN ASPART 100 UNIT/ML ~~LOC~~ SOLN: 0.0000 [IU] | Freq: Every day | SUBCUTANEOUS | Status: DC

## 2016-09-24 MED ORDER — SODIUM CHLORIDE 0.9 % IV BOLUS (SEPSIS)
500.0000 mL | Freq: Once | INTRAVENOUS | Status: AC
  Administered 2016-09-24: 500 mL via INTRAVENOUS

## 2016-09-24 MED ORDER — NITROGLYCERIN 0.4 MG SL SUBL
SUBLINGUAL_TABLET | SUBLINGUAL | Status: AC
  Administered 2016-09-24: 0.4 mg via SUBLINGUAL
  Filled 2016-09-24: qty 1

## 2016-09-24 MED ORDER — METHIMAZOLE 5 MG PO TABS
10.0000 mg | ORAL_TABLET | Freq: Every day | ORAL | Status: DC
  Administered 2016-09-25: 10 mg via ORAL
  Filled 2016-09-24: qty 2

## 2016-09-24 MED ORDER — ACETAMINOPHEN 650 MG RE SUPP: 650.0000 mg | Freq: Four times a day (QID) | RECTAL | Status: DC | PRN

## 2016-09-24 MED ORDER — AMIODARONE HCL 200 MG PO TABS: 400.0000 mg | ORAL_TABLET | Freq: Every day | ORAL | Status: DC

## 2016-09-24 MED ORDER — GI COCKTAIL ~~LOC~~
30.0000 mL | Freq: Once | ORAL | Status: AC
  Administered 2016-09-24: 30 mL via ORAL

## 2016-09-24 MED ORDER — ONDANSETRON HCL 4 MG PO TABS: 4.0000 mg | ORAL_TABLET | Freq: Four times a day (QID) | ORAL | Status: DC | PRN

## 2016-09-24 MED ORDER — CARVEDILOL 6.25 MG PO TABS
6.2500 mg | ORAL_TABLET | Freq: Two times a day (BID) | ORAL | Status: DC
  Administered 2016-09-24 – 2016-09-25 (×2): 6.25 mg via ORAL
  Filled 2016-09-24 (×2): qty 1

## 2016-09-24 MED ORDER — MORPHINE SULFATE (PF) 2 MG/ML IV SOLN
2.0000 mg | INTRAVENOUS | Status: DC | PRN
  Administered 2016-09-24: 2 mg via INTRAVENOUS
  Filled 2016-09-24 (×2): qty 1

## 2016-09-24 MED ORDER — PANTOPRAZOLE SODIUM 40 MG IV SOLR
40.0000 mg | Freq: Once | INTRAVENOUS | Status: AC
Start: 2016-09-24 — End: 2016-09-24
  Administered 2016-09-24: 40 mg via INTRAVENOUS
  Filled 2016-09-24: qty 40

## 2016-09-24 MED ORDER — SODIUM CHLORIDE 0.9 % IV SOLN
INTRAVENOUS | Status: DC
  Administered 2016-09-24 – 2016-09-25 (×2): via INTRAVENOUS

## 2016-09-24 MED ORDER — ONDANSETRON HCL 4 MG/2ML IJ SOLN
4.0000 mg | Freq: Four times a day (QID) | INTRAMUSCULAR | Status: DC | PRN
  Administered 2016-09-24: 4 mg via INTRAVENOUS
  Filled 2016-09-24: qty 2

## 2016-09-24 MED ORDER — GI COCKTAIL ~~LOC~~
30.0000 mL | Freq: Once | ORAL | Status: AC
  Administered 2016-09-24: 30 mL via ORAL
  Filled 2016-09-24: qty 30

## 2016-09-24 MED ORDER — PANTOPRAZOLE SODIUM 40 MG PO TBEC
40.0000 mg | DELAYED_RELEASE_TABLET | Freq: Every day | ORAL | Status: DC
  Administered 2016-09-25: 40 mg via ORAL
  Filled 2016-09-24: qty 1

## 2016-09-24 MED ORDER — ONDANSETRON HCL 4 MG/2ML IJ SOLN
4.0000 mg | Freq: Once | INTRAMUSCULAR | Status: AC
  Administered 2016-09-24: 4 mg via INTRAVENOUS
  Filled 2016-09-24: qty 2

## 2016-09-24 MED ORDER — HEPARIN BOLUS VIA INFUSION
2650.0000 [IU] | Freq: Once | INTRAVENOUS | Status: AC
  Administered 2016-09-24: 2650 [IU] via INTRAVENOUS
  Filled 2016-09-24: qty 2650

## 2016-09-24 MED ORDER — MORPHINE SULFATE (PF) 4 MG/ML IV SOLN
4.0000 mg | Freq: Once | INTRAVENOUS | Status: AC
Start: 2016-09-24 — End: 2016-09-24
  Administered 2016-09-24: 4 mg via INTRAVENOUS
  Filled 2016-09-24: qty 1

## 2016-09-24 MED ORDER — HEPARIN BOLUS VIA INFUSION
4000.0000 [IU] | Freq: Once | INTRAVENOUS | Status: AC
  Administered 2016-09-24: 4000 [IU] via INTRAVENOUS
  Filled 2016-09-24: qty 4000

## 2016-09-24 MED ORDER — NITROGLYCERIN 0.4 MG SL SUBL
0.4000 mg | SUBLINGUAL_TABLET | SUBLINGUAL | Status: DC | PRN
  Administered 2016-09-24 (×3): 0.4 mg via SUBLINGUAL

## 2016-09-24 MED ORDER — INSULIN ASPART 100 UNIT/ML ~~LOC~~ SOLN
0.0000 [IU] | Freq: Three times a day (TID) | SUBCUTANEOUS | Status: DC
Start: 2016-09-24 — End: 2016-09-25
  Administered 2016-09-24 – 2016-09-25 (×2): 2 [IU] via SUBCUTANEOUS
  Filled 2016-09-24 (×2): qty 2

## 2016-09-24 NOTE — ED Notes (Signed)
Crit trop reported to schaevitz md.

## 2016-09-24 NOTE — Progress Notes (Signed)
Patient complaining of  Abdominal of  "14" on a pain  scale  unrelieved  with Morphine 2 mg IV. Patient stated gi cocktail helped earlier on. Dr. Emmit PomfretHugelmeyer notified with 1 time dose of  Gi cocktail oral and statedn to  call back if unrelieved. Will continue to monitor.

## 2016-09-24 NOTE — Progress Notes (Signed)
Patient stated his pain level is 0/10 on the pain scale. Will continue to monitor.

## 2016-09-24 NOTE — ED Triage Notes (Signed)
Patient arrived to ER from home for central chest pain. PAtient states pain started at 0600 and continued with N/V. Pain got unbearable so patient was brought to ER.

## 2016-09-24 NOTE — Progress Notes (Addendum)
ANTICOAGULATION CONSULT NOTE - Initial Consult  Pharmacy Consult for heparin drip/ warfarin Indication: chest pain/ACS  Allergies  Allergen Reactions  . Sulfur Other (See Comments)    Pt states that this medication causes renal failure.      Patient Measurements: Height: 6\' 2"  (188 cm) Weight: 186 lb (84.4 kg) IBW/kg (Calculated) : 82.2 Heparin Dosing Weight: 84.4kg  Vital Signs: Temp: 99.5 F (37.5 C) (12/24 1417) Temp Source: Oral (12/24 1417) BP: 173/93 (12/24 1500) Pulse Rate: 129 (12/24 1500)  Labs:  Recent Labs  09/24/16 1300 09/24/16 1309  HGB 9.5*  --   HCT 29.3*  --   PLT 157  --   APTT  --  32  LABPROT  --  20.0*  INR  --  1.68  CREATININE 3.28*  --   TROPONINI 0.05*  --     Estimated Creatinine Clearance: 32.4 mL/min (by C-G formula based on SCr of 3.28 mg/dL (H)).   Medical History: Past Medical History:  Diagnosis Date  . Cellulitis and abscess of foot    Left-Dr. Ether GriffinsFowler  . CKD (chronic kidney disease), stage III   . Diabetes mellitus without complication (HCC)    a. Dx ~ 1996.  . Essential hypertension   . Gastritis    a. 04/2015 hematemesis -> EGD: gastritis, esophagitis, duodenitis.  No active bleeding.  PPI added.  [name]. GERD (gastroesophageal reflux disease)   . Left leg DVT (HCC)    a. Dx 05/2015 -> Coumadin.  . MI (myocardial infarction)   . Osteomyelitis (HCC)    a. 05/2015 L foot.    Assessment: 45yo male with complaints of chest pain. Patient on warfarin 2mg  daily at home. Last dose at 6am this morning.  Baseline labs ordered.   Date  INR  Dose 12/24  1.68    Goal of Therapy:  Heparin level 0.3-0.7 units/ml Monitor platelets by anticoagulation protocol: Yes   Plan:  Give 4000 units bolus x 1 Start heparin infusion at 1100 units/hr Check anti-Xa level in 6 hours and daily while on heparin Continue to monitor H&H and platelets  12/24: INR subtherapeutic @ 1.68. Will give another 2mg  of warfarin tonight and check INR/CBC in  the morning.   DI: amiodarone and methimazole are home medications.   Delsa BernKelly m Nishi Neiswonger, PharmD 09/24/2016,3:17 PM

## 2016-09-24 NOTE — ED Provider Notes (Addendum)
[facility] Emergency Department Provider Note  ____________________________________________   First MD Initiated Contact with Patient 09/24/16 1300     (approximate)  I have reviewed the triage vital signs and the nursing notes.   HISTORY  Chief Complaint Chest Pain   HPI Juan BlossomWaltzie L Hase Jr. is a 45 y.o. male with a history of atrial fibrillation on amiodaroneon a full dose of aspirin was presented to the emergency department chest pain as well as nausea and vomiting. He says that the chest pain started at 6 AM this morning and has been constant and worsening. He describes as a tightness and it is 10 out of 10 right now. He says that he had some radiation to the left arm earlier but this has stopped at this time and it is just the center of his chest. No radiation through to the back. Associated with shortness of breath as well as nausea and vomiting. No vomiting of blood. The patient says that he has been vomiting constantly since 6 AM though. Says that he is similar presentation in October when he was also patient in  regional. He denies drinking or drug use.   Past Medical History:  Diagnosis Date  . Cellulitis and abscess of foot    Left-Dr. Ether GriffinsFowler  . CKD (chronic kidney disease), stage III   . Diabetes mellitus without complication (HCC)    a. Dx ~ 1996.  . Essential hypertension   . Gastritis    a. 04/2015 hematemesis -> EGD: gastritis, esophagitis, duodenitis.  No active bleeding.  PPI added.  [name]. GERD (gastroesophageal reflux disease)   . Left leg DVT (HCC)    a. Dx 05/2015 -> Coumadin.  . MI (myocardial infarction)   . Osteomyelitis (HCC)    a. 05/2015 L foot.    Patient Active Problem List   Diagnosis Date Noted  . Acute bronchitis 07/21/2016  . Acute on chronic kidney failure (HCC) 02/28/2016  . Cellulitis 12/06/2015  . AKI (acute kidney injury) (HCC) 11/01/2015  . Hyperkalemia 11/01/2015    Past Surgical History:  Procedure  Laterality Date  . ESOPHAGOGASTRODUODENOSCOPY N/A 04/07/2015   Procedure: ESOPHAGOGASTRODUODENOSCOPY (EGD);  Surgeon: Midge Miniumarren Wohl, MD;  [facility] END[facility];  Service: Endoscopy;  Laterality: N/A;  . ESOPHAGOGASTRODUODENOSCOPY (EGD) WITH PROPOFOL Left 06/30/2015   Procedure: ESOPHAGOGASTRODUODENOSCOPY (EGD) WITH PROPOFOL;  Surgeon: Christena DeemMartin U Skulskie, MD;  [facility];  Service: Endoscopy;  Laterality: Left;  . FOOT SURGERY    . I&D EXTREMITY Left 04/06/2015   Procedure: IRRIGATION AND DEBRIDEMENT EXTREMITY;  Surgeon: Gwyneth RevelsJustin Fowler, DPM;  Location: [facility] ORS;  Service: Podiatry;  Laterality: Left;  . IRRIGATION AND DEBRIDEMENT FOOT Left 05/21/2015   Procedure: IRRIGATION AND DEBRIDEMENT FOOT;  Surgeon: Linus Galasodd Cline, MD;  Location: [facility] ORS;  Service: Podiatry;  Laterality: Left;    Prior to Admission medications   Medication Sig Start Date End Date Taking? Authorizing Provider  amiodarone (PACERONE) 400 MG tablet Take 1 tablet (400 mg total) by mouth daily. 07/22/16  Yes Alford Highlandichard Wieting, MD  aspirin EC 81 MG EC tablet Take 1 tablet (81 mg total) by mouth daily. Patient taking differently: Take 325 mg by mouth daily.  07/23/16  Yes Richard Renae GlossWieting, MD  carvedilol (COREG) 6.25 MG tablet Take 1 tablet by mouth 2 (two) times daily. 11/19/15  Yes Historical Provider, MD  cloNIDine (CATAPRES - DOSED IN MG/24 HR) 0.3 mg/24hr patch Place 0.3 mg onto the skin once a week.   Yes Historical Provider, MD  cloNIDine (CATAPRES) 0.2 MG tablet Take 1 tablet (0.2 mg total) by mouth 2 (two) times daily. 07/22/16  Yes Alford Highland, MD  furosemide (LASIX) 40 MG tablet Take 40 mg by mouth daily. 07/10/16  Yes Historical Provider, MD  methimazole (TAPAZOLE) 10 MG tablet Take 1 tablet (10 mg total) by mouth daily. 07/22/16  Yes Richard Renae Gloss, MD  TOUJEO SOLOSTAR 300 UNIT/ML SOPN Inject 25 Units into the skin every morning. 07/03/16  Yes Historical Provider, MD  warfarin (COUMADIN) 2 MG tablet Take 1 tablet  by mouth daily. 09/02/16  Yes Historical Provider, MD  feeding supplement, GLUCERNA SHAKE, (GLUCERNA SHAKE) LIQD Take 237 mLs by mouth 2 (two) times daily between meals. Patient not taking: Reported on 09/24/2016 07/22/16   Alford Highland, MD  triamcinolone cream (KENALOG) 0.1 % Apply twice a day to skin rash Patient not taking: Reported on 09/24/2016 07/22/16   Alford Highland, MD    Allergies Sulfur  Family History  Problem Relation Age of Onset  . Coronary artery disease Father   . Pancreatic cancer Mother   . Breast cancer Sister   . Lung cancer Brother   . Cervical cancer Sister     Social History Social History  Substance Use Topics  . Smoking status: Never Smoker  . Smokeless tobacco: Never Used  . Alcohol use No    Review of Systems Constitutional: No fever/chills Eyes: No visual changes. ENT: No sore throat. Cardiovascular: As above Respiratory: As above Gastrointestinal: No abdominal pain.  No diarrhea.  No constipation. Genitourinary: Negative for dysuria. Musculoskeletal: Negative for back pain. Skin: Negative for rash. Neurological: Negative for headaches, focal weakness or numbness.  10-point ROS otherwise negative.  ____________________________________________   PHYSICAL EXAM:  VITAL SIGNS: ED Triage Vitals  Enc Vitals Group     BP 09/24/16 1257 (!) 212/103     Pulse Rate 09/24/16 1257 (!) 131     Resp 09/24/16 1257 (!) 21     Temp --      Temp src --      SpO2 09/24/16 1257 100 %     Weight 09/24/16 1257 186 lb (84.4 kg)     Height 09/24/16 1257 6\' 2"  (1.88 m)     Head Circumference --      Peak Flow --      Pain Score 09/24/16 1258 10     Pain Loc --      Pain Edu? --      Excl. in GC? --     Constitutional: Alert and oriented. Patient is in moderate distress. Appears very uncomfortable. Moaning. Eyes closed. However, he is alert and oriented 3. Able to give a full history. Eyes: Conjunctivae are normal. PERRL. EOMI. Head:  Atraumatic. Nose: No congestion/rhinnorhea. Mouth/Throat: Mucous membranes are moist.   Neck: No stridor.   Cardiovascular: Tachycardic, regular rhythm. Grossly normal heart sounds.   Respiratory: Normal respiratory effort.  No retractions. Lungs CTAB. Gastrointestinal: Soft and nontender. No distention. No abdominal bruits. No CVA tenderness. Musculoskeletal: Trace pitting edema to the bilateral lower extremities.. Neurologic:  Normal speech and language. No gross focal neurologic deficits are appreciated. No gait instability. Skin:  Skin is warm, dry and intact. No rash noted. Psychiatric: Mood and affect are normal. Speech and behavior are normal.  ____________________________________________   LABS (all labs ordered are listed, but only abnormal results are displayed)  Labs Reviewed  BASIC METABOLIC PANEL - Abnormal; Notable for the following:       Result Value  CO2 15 (*)    Glucose, Bld 198 (*)    BUN 67 (*)    Creatinine, Ser 3.28 (*)    GFR calc non Af Amer 21 (*)    GFR calc Af Amer 24 (*)    All other components within normal limits  CBC - Abnormal; Notable for the following:    WBC 11.3 (*)    RBC 3.43 (*)    Hemoglobin 9.5 (*)    HCT 29.3 (*)    RDW 15.5 (*)    All other components within normal limits  TROPONIN I - Abnormal; Notable for the following:    Troponin I 0.05 (*)    All other components within normal limits  BRAIN NATRIURETIC PEPTIDE - Abnormal; Notable for the following:    B Natriuretic Peptide 448.0 (*)    All other components within normal limits  HEPATIC FUNCTION PANEL - Abnormal; Notable for the following:    Alkaline Phosphatase 136 (*)    Total Bilirubin 1.5 (*)    Bilirubin, Direct 0.7 (*)    All other components within normal limits  PROTIME-INR - Abnormal; Notable for the following:    Prothrombin Time 20.0 (*)    All other components within normal limits  URINE DRUG SCREEN, QUALITATIVE ([facility] ONLY)  LIPASE, BLOOD  APTT    ____________________________________________  EKG  ED ECG REPORT I, Arelia Longest, the attending physician, personally viewed and interpreted this ECG.   Date: 09/24/2016  EKG Time: 1250  Rate: 132  Rhythm: sinus tachycardia  Axis: Normal axis  Intervals: Bifascicular block  ST&T Change: ST depressions of 1 mm in 1, 2, aVL as well as V3 through V6. Mild ST elevation in lead 3.  No significant change from EKG of 07/21/2016.  ____________________________________________  RADIOLOGY DG Chest Portable 1 View (Final result)  Result time 09/24/16 14:02:00  Final result by Holley Dexter, MD (09/24/16 14:02:00)           Narrative:   CLINICAL DATA: Chest pain today.  EXAM: PORTABLE CHEST 1 VIEW  COMPARISON: Single-view of the chest 07/21/2016. PA and lateral chest 07/17/2016.  FINDINGS: The lungs are clear. There is cardiomegaly. No pneumothorax or pleural effusion.  IMPRESSION: No acute disease.   Electronically Signed By: Drusilla Kanner M.D. On: 09/24/2016 14:02           ____________________________________________   PROCEDURES  Procedure(s) performed:   CRITICAL CARE Performed by: Arelia Longest   Total critical care time: 35 minutes  Critical care time was exclusive of separately billable procedures and treating other patients.  Critical care was necessary to treat or prevent imminent or life-threatening deterioration.  Critical care was time spent personally by me on the following activities: development of treatment plan with patient and/or surrogate as well as nursing, discussions with consultants, evaluation of patient's response to treatment, examination of patient, obtaining history from patient or surrogate, ordering and performing treatments and interventions, ordering and review of laboratory studies, ordering and review of radiographic studies, pulse oximetry and re-evaluation of patient's condition.   Angiocath  insertion Performed by: Arelia Longest  Consent: Verbal consent obtained. Risks and benefits: risks, benefits and alternatives were discussed   Preparation: Patient was prepped with chlorhexidine. Very good external jugular vein visualized to the left side of the neck.   Vein Location: Left external jugular  Not Ultrasound Guided  Gauge: 20-gauge  Normal blood return and flush without difficulty Patient tolerance: Patient tolerated the procedure  well with no immediate complications.     Procedures  Critical Care performed:   ____________________________________________   INITIAL IMPRESSION / ASSESSMENT AND PLAN / ED COURSE  Pertinent labs & imaging results that were available during my care of the patient were reviewed by me and considered in my medical decision making (see chart for details).   Clinical Course as of Sep 24 1509  Wynelle LinkSun Sep 24, 2016  1339 Patient pain-free after 3 nitros.  [DS]    Clinical Course User Index [DS] Myrna Blazeravid Matthew Elliana Bal, MD    ----------------------------------------- 3:11 PM on 09/24/2016 -----------------------------------------  Patient was having epigastric abdominal pain but his LFTs as well as lipase appeared baseline. Possibly secondary to vomiting. Given morphine. We'll admit to the hospital for worsening renal failure with positive troponin and tachycardia. Started on heparin secondary to some therapy on Coumadin and because of his symptoms. Signed out to Dr. Clint GuyHower. The patient as well as the plan for admission of money to comply. ____________________________________________   FINAL CLINICAL IMPRESSION(S) / ED DIAGNOSES  Chest pain. Tachycardia. Epigastric abdominal pain.    NEW MEDICATIONS STARTED DURING THIS VISIT:  New Prescriptions   No medications on file     Note:  This document was prepared using Dragon voice recognition software and may include unintentional dictation errors.    Myrna Blazeravid Matthew  Trica Usery, MD 09/24/16 1511    Myrna Blazeravid Matthew Drayke Grabel, MD 09/24/16 [phone]

## 2016-09-24 NOTE — H&P (Addendum)
[employer] [facility] [place] [facility]   PATIENT NAME: Juan Roberson    MR#:  [id number]  DATE OF BIRTH:  01/23/1971   DATE OF ADMISSION:  09/24/2016  PRIMARY CARE PHYSICIAN: Corky DownsMASOUD,JAVED, MD   REQUESTING/REFERRING PHYSICIAN: Schaevitz  CHIEF COMPLAINT:   Chief Complaint  Patient presents with  . Chest Pain    HISTORY OF PRESENT ILLNESS:  Juan ButtersWaltzie Roberson  is a 45 y.o. male with a known history of Atrial fibrillation, chronic kidney disease stage IIIin with chest pain. Describes acute onset chest pain at rest retrosternal in location and sharp in quality 8/10 in intensity associated nausea/vomiting. He states he had preceding nausea and vomiting for 1 day total duration with by mouth intolerance, vomiting nonbloody nonbilious emesis, epigastric pain 6/10 in intensity no worsening or relieving factors nonradiating  Emergency department: Noted to be tachycardic and hypertensive  PAST MEDICAL HISTORY:   Past Medical History:  Diagnosis Date  . Cellulitis and abscess of foot    Left-Dr. Ether GriffinsFowler  . CKD (chronic kidney disease), stage III   . Diabetes mellitus without complication (HCC)    a. Dx ~ 1996.  . Essential hypertension   . Gastritis    a. 04/2015 hematemesis -> EGD: gastritis, esophagitis, duodenitis.  No active bleeding.  PPI added.  [name]. GERD (gastroesophageal reflux disease)   . Left leg DVT (HCC)    a. Dx 05/2015 -> Coumadin.  . MI (myocardial infarction)   . Osteomyelitis (HCC)    a. 05/2015 L foot.    PAST SURGICAL HISTORY:   Past Surgical History:  Procedure Laterality Date  . ESOPHAGOGASTRODUODENOSCOPY N/A 04/07/2015   Procedure: ESOPHAGOGASTRODUODENOSCOPY (EGD);  Surgeon: Midge Miniumarren Wohl, MD;  [facility];  Service: Endoscopy;  Laterality: N/A;  . ESOPHAGOGASTRODUODENOSCOPY (EGD) WITH PROPOFOL Left 06/30/2015   Procedure: ESOPHAGOGASTRODUODENOSCOPY (EGD) WITH PROPOFOL;  Surgeon: Christena DeemMartin U Skulskie, MD;  [facility];  Service:  Endoscopy;  Laterality: Left;  . FOOT SURGERY    . I&D EXTREMITY Left 04/06/2015   Procedure: IRRIGATION AND DEBRIDEMENT EXTREMITY;  Surgeon: Gwyneth RevelsJustin Fowler, DPM;  Location: [facility] ORS;  Service: Podiatry;  Laterality: Left;  . IRRIGATION AND DEBRIDEMENT FOOT Left 05/21/2015   Procedure: IRRIGATION AND DEBRIDEMENT FOOT;  Surgeon: Linus Galasodd Cline, MD;  Location: [facility] ORS;  Service: Podiatry;  Laterality: Left;    SOCIAL HISTORY:   Social History  Substance Use Topics  . Smoking status: Never Smoker  . Smokeless tobacco: Never Used  . Alcohol use No    FAMILY HISTORY:   Family History  Problem Relation Age of Onset  . Coronary artery disease Father   . Pancreatic cancer Mother   . Breast cancer Sister   . Lung cancer Brother   . Cervical cancer Sister     DRUG ALLERGIES:   Allergies  Allergen Reactions  . Sulfur Other (See Comments)    Pt states that this medication causes renal failure.      REVIEW OF SYSTEMS:  REVIEW OF SYSTEMS:  CONSTITUTIONAL: Denies fevers, chills, fatigue, weakness.  EYES: Denies blurred vision, double vision, or eye pain.  EARS, NOSE, THROAT: Denies tinnitus, ear pain, hearing loss.  RESPIRATORY: denies cough, shortness of breath, wheezing  CARDIOVASCULAR: Positive chest pain, palpitations, denies edema.  GASTROINTESTINAL: Positive nausea, vomiting, , abdominal pain.  GENITOURINARY: Denies dysuria, hematuria.  ENDOCRINE: Denies nocturia or thyroid problems. HEMATOLOGIC AND LYMPHATIC: Denies easy bruising or bleeding.  SKIN: Denies rash or lesions.  MUSCULOSKELETAL: Denies pain in neck, back, shoulder, knees,  hips, or further arthritic symptoms.  NEUROLOGIC: Denies paralysis, paresthesias.  PSYCHIATRIC: Denies anxiety or depressive symptoms. Otherwise full review of systems performed by me is negative.   MEDICATIONS AT HOME:   Prior to Admission medications   Medication Sig Start Date End Date Taking? Authorizing Provider  amiodarone (PACERONE) 400  MG tablet Take 1 tablet (400 mg total) by mouth daily. 07/22/16  Yes Alford Highland, MD  aspirin EC 81 MG EC tablet Take 1 tablet (81 mg total) by mouth daily. Patient taking differently: Take 325 mg by mouth daily.  07/23/16  Yes Richard Renae Gloss, MD  carvedilol (COREG) 6.25 MG tablet Take 1 tablet by mouth 2 (two) times daily. 11/19/15  Yes Historical Provider, MD  cloNIDine (CATAPRES - DOSED IN MG/24 HR) 0.3 mg/24hr patch Place 0.3 mg onto the skin once a week.   Yes Historical Provider, MD  cloNIDine (CATAPRES) 0.2 MG tablet Take 1 tablet (0.2 mg total) by mouth 2 (two) times daily. 07/22/16  Yes Alford Highland, MD  furosemide (LASIX) 40 MG tablet Take 40 mg by mouth daily. 07/10/16  Yes Historical Provider, MD  methimazole (TAPAZOLE) 10 MG tablet Take 1 tablet (10 mg total) by mouth daily. 07/22/16  Yes Richard Renae Gloss, MD  TOUJEO SOLOSTAR 300 UNIT/ML SOPN Inject 25 Units into the skin every morning. 07/03/16  Yes Historical Provider, MD  warfarin (COUMADIN) 2 MG tablet Take 1 tablet by mouth daily. 09/02/16  Yes Historical Provider, MD  feeding supplement, GLUCERNA SHAKE, (GLUCERNA SHAKE) LIQD Take 237 mLs by mouth 2 (two) times daily between meals. Patient not taking: Reported on 09/24/2016 07/22/16   Alford Highland, MD  triamcinolone cream (KENALOG) 0.1 % Apply twice a day to skin rash Patient not taking: Reported on 09/24/2016 07/22/16   Alford Highland, MD      VITAL SIGNS:  Blood pressure (!) 173/93, pulse (!) 129, temperature 99.5 F (37.5 C), temperature source Oral, resp. rate 20, height 6\' 2"  (1.88 m), weight 84.4 kg (186 lb), SpO2 97 %.  PHYSICAL EXAMINATION:  VITAL SIGNS: Vitals:   09/24/16 1417 09/24/16 1500  BP:  (!) 173/93  Pulse:  (!) 129  Resp:  20  Temp: 99.5 F (37.5 C)    GENERAL:45 y.o.male currently in no acute distress.  HEAD: Normocephalic, atraumatic.  EYES: Pupils equal, round, reactive to light. Extraocular muscles intact. No scleral icterus.  MOUTH:  Moist mucosal membrane. Dentition intact. No abscess noted.  EAR, NOSE, THROAT: Clear without exudates. No external lesions.  NECK: Supple. No thyromegaly. No nodules. No JVD.  PULMONARY: Clear to ascultation, without wheeze rails or rhonci. No use of accessory muscles, Good respiratory effort. good air entry bilaterally CHEST: Nontender to palpation.  CARDIOVASCULAR: S1 and S2. Tachycardic No murmurs, rubs, or gallops. No edema. Pedal pulses 2+ bilaterally.  GASTROINTESTINAL: Soft, nontender, nondistended. No masses. Positive bowel sounds. No hepatosplenomegaly.  MUSCULOSKELETAL: No swelling, clubbing, or edema. Range of motion full in all extremities.  NEUROLOGIC: Cranial nerves II through XII are intact. No gross focal neurological deficits. Sensation intact. Reflexes intact.  SKIN: No ulceration, lesions, rashes, or cyanosis. Skin warm and dry. Turgor intact.  PSYCHIATRIC: Mood, affect within normal limits. The patient is awake, alert and oriented x 3. Insight, judgment intact.    LABORATORY PANEL:   CBC  Recent Labs Lab 09/24/16 1300  WBC 11.3*  HGB 9.5*  HCT 29.3*  PLT 157   ------------------------------------------------------------------------------------------------------------------  Chemistries   Recent Labs Lab 09/24/16 1300 09/24/16 1309  NA 138  --  K 4.7  --   CL 108  --   CO2 15*  --   GLUCOSE 198*  --   BUN 67*  --   CREATININE 3.28*  --   CALCIUM 9.7  --   AST  --  37  ALT  --  17  ALKPHOS  --  136*  BILITOT  --  1.5*   ------------------------------------------------------------------------------------------------------------------  Cardiac Enzymes  Recent Labs Lab 09/24/16 1300  TROPONINI 0.05*   ------------------------------------------------------------------------------------------------------------------  RADIOLOGY:  Dg Chest Portable 1 View  Result Date: 09/24/2016 CLINICAL DATA:  Chest pain today. EXAM: PORTABLE CHEST 1 VIEW  COMPARISON:  Single-view of the chest 07/21/2016. PA and lateral chest 07/17/2016. FINDINGS: The lungs are clear. There is cardiomegaly. No pneumothorax or pleural effusion. IMPRESSION: No acute disease. Electronically Signed   By: Drusilla Kannerhomas  Dalessio M.D.   On: 09/24/2016 14:02    EKG:   Orders placed or performed during the hospital encounter of 09/24/16  . EKG 12-Lead  . EKG 12-Lead  . ED EKG within 10 minutes  . ED EKG within 10 minutes    IMPRESSION AND PLAN:   45 year old Caucasian gentleman history of atrial fibrillation on anticoagulation, presenting with chest pain  1.Chest pain, central: Initiate aspirin and statin therapy, admitted to telemetry, trend cardiac enzymes 3,  if continued elevation will initiate heparin drip ,nitroglycerin when necessary, morphine when necessary, consult cardiology follows with Dr Welton FlakesKhan  2. Nausea vomiting abdominal pain: Suspect gastritis provide PPI therapy, GI cocktail 1 3. Atrial fibrillation with subtherapeutic INR started on heparin 4. Type 2 diabetes continue basal insulin sliding scale coverage 5. Hypertensive urgency continue home medications add as needed hydralazine 6. aki no ckd: gentle IVf hydration     All the records are reviewed and case discussed with ED provider. Management plans discussed with the patient, family and they are in agreement.  CODE STATUS: Full  TOTAL TIME TAKING CARE OF THIS PATIENT: 33 minutes.    Hower,  Mardi MainlandDavid K M.D on 09/24/2016 at 3:53 PM  Between 7am to 6pm - Pager - [phone]  After 6pm: House Pager: - [phone]  [facility]  Office  [phone]  CC: Primary care physician; Corky DownsMASOUD,JAVED, MD

## 2016-09-25 LAB — BASIC METABOLIC PANEL
Anion gap: 8 (ref 5–15)
BUN: 71 mg/dL — AB (ref 6–20)
CHLORIDE: 111 mmol/L (ref 101–111)
CO2: 19 mmol/L — AB (ref 22–32)
CREATININE: 3.25 mg/dL — AB (ref 0.61–1.24)
Calcium: 8.9 mg/dL (ref 8.9–10.3)
GFR calc Af Amer: 24 mL/min — ABNORMAL LOW (ref >60)
GFR calc non Af Amer: 21 mL/min — ABNORMAL LOW (ref >60)
Glucose, Bld: 185 mg/dL — ABNORMAL HIGH (ref 65–99)
POTASSIUM: 5.3 mmol/L — AB (ref 3.5–5.1)
SODIUM: 138 mmol/L (ref 135–145)

## 2016-09-25 LAB — CBC
HEMATOCRIT: 27.5 % — AB (ref 40.0–52.0)
Hemoglobin: 8.8 g/dL — ABNORMAL LOW (ref 13.0–18.0)
MCH: 27.7 pg (ref 26.0–34.0)
MCHC: 32.2 g/dL (ref 32.0–36.0)
MCV: 86.2 fL (ref 80.0–100.0)
PLATELETS: 99 10*3/uL — AB (ref 150–440)
RBC: 3.19 MIL/uL — ABNORMAL LOW (ref 4.40–5.90)
RDW: 15.8 % — AB (ref 11.5–14.5)
WBC: 5.1 10*3/uL (ref 3.8–10.6)

## 2016-09-25 LAB — PROTIME-INR
INR: 2.16
Prothrombin Time: 24.4 seconds — ABNORMAL HIGH (ref 11.4–15.2)

## 2016-09-25 LAB — GLUCOSE, CAPILLARY: GLUCOSE-CAPILLARY: 158 mg/dL — AB (ref 65–99)

## 2016-09-25 LAB — HEPARIN LEVEL (UNFRACTIONATED): Heparin Unfractionated: 0.34 IU/mL (ref 0.30–0.70)

## 2016-09-25 MED ORDER — AMIODARONE HCL 200 MG PO TABS: 200.0000 mg | ORAL_TABLET | Freq: Every day | ORAL | 2 refills | Status: DC

## 2016-09-25 MED ORDER — AMIODARONE HCL 200 MG PO TABS
200.0000 mg | ORAL_TABLET | Freq: Every day | ORAL | Status: DC
  Administered 2016-09-25: 200 mg via ORAL
  Filled 2016-09-25: qty 1

## 2016-09-25 MED ORDER — ALUM & MAG HYDROXIDE-SIMETH 200-200-20 MG/5ML PO SUSP: 30.0000 mL | Freq: Four times a day (QID) | ORAL | 0 refills | Status: DC | PRN

## 2016-09-25 MED ORDER — WARFARIN SODIUM 1 MG PO TABS: 2.0000 mg | ORAL_TABLET | Freq: Every day | ORAL | Status: DC

## 2016-09-25 MED ORDER — SODIUM POLYSTYRENE SULFONATE 15 GM/60ML PO SUSP
30.0000 g | Freq: Once | ORAL | Status: AC
  Administered 2016-09-25: 30 g via ORAL
  Filled 2016-09-25: qty 120

## 2016-09-25 MED ORDER — ALUM & MAG HYDROXIDE-SIMETH 200-200-20 MG/5ML PO SUSP
30.0000 mL | Freq: Four times a day (QID) | ORAL | Status: DC | PRN
Start: 2016-09-25 — End: 2016-09-25

## 2016-09-25 MED ORDER — PANTOPRAZOLE SODIUM 40 MG PO TBEC: 40.0000 mg | DELAYED_RELEASE_TABLET | Freq: Every day | ORAL | 0 refills | Status: DC

## 2016-09-25 NOTE — Discharge Instructions (Signed)
Heart healthy and ADA diet. °

## 2016-09-25 NOTE — Progress Notes (Signed)
Pt to be discharged today. Iv and tele removed. disch instructions and prescrips given to pt. Transport will be provided by firend

## 2016-09-25 NOTE — Progress Notes (Signed)
ANTICOAGULATION CONSULT NOTE - Initial Consult  Pharmacy Consult for heparin drip/ warfarin Indication: chest pain/ACS  Allergies  Allergen Reactions  . Sulfur Other (See Comments)    Pt states that this medication causes renal failure.      Patient Measurements: Height: 6\' 2"  (188 cm) Weight: 194 lb 8 oz (88.2 kg) IBW/kg (Calculated) : 82.2 Heparin Dosing Weight: 84.4kg  Vital Signs: Temp: 98.2 F (36.8 C) (12/25 0620) Temp Source: Oral (12/25 0620) BP: 157/85 (12/25 0620) Pulse Rate: 90 (12/25 0620)  Labs:  Recent Labs  09/24/16 1300 09/24/16 1309 09/24/16 2218 09/25/16 0600  HGB 9.5*  --   --  8.8*  HCT 29.3*  --   --  27.5*  PLT 157  --   --  99*  APTT  --  32  --   --   LABPROT  --  20.0*  --  24.4*  INR  --  1.68  --  2.16  HEPARINUNFRC  --   --  0.12* 0.34  CREATININE 3.28*  --   --  3.25*  TROPONINI 0.05*  --   --   --     Estimated Creatinine Clearance: 32.7 mL/min (by C-G formula based on SCr of 3.25 mg/dL (H)).   Medical History: Past Medical History:  Diagnosis Date  . Cellulitis and abscess of foot    Left-Dr. Ether GriffinsFowler  . CKD (chronic kidney disease), stage III   . Diabetes mellitus without complication (HCC)    a. Dx ~ 1996.  . Essential hypertension   . Gastritis    a. 04/2015 hematemesis -> EGD: gastritis, esophagitis, duodenitis.  No active bleeding.  PPI added.  [name]. GERD (gastroesophageal reflux disease)   . Left leg DVT (HCC)    a. Dx 05/2015 -> Coumadin.  . MI (myocardial infarction)   . Osteomyelitis (HCC)    a. 05/2015 L foot.    Assessment: 45yo male with complaints of chest pain. Patient on warfarin 2mg  daily at home. Last dose at 6am this morning.  Baseline labs ordered.   Date  INR  Dose 12/24  1.68    Goal of Therapy:  Heparin level 0.3-0.7 units/ml Monitor platelets by anticoagulation protocol: Yes   Plan:  Give 4000 units bolus x 1 Start heparin infusion at 1100 units/hr Check anti-Xa level in 6 hours and daily while  on heparin Continue to monitor H&H and platelets  12/24: INR subtherapeutic @ 1.68. Will give another 2mg  of warfarin tonight and check INR/CBC in the morning.   12/24 2218 HL subtherapeutic. 2650 units IV x 1 bolus and increase rate to 1300 units/hr. Recheck aPTT in 6 hours.  DI: amiodarone and methimazole are home medications.   12/25 0600 HL: 0.34- 1st therapeutic heparin level. Will continue current rate of 1300units/hr. Will recheck HL in 6 hours. INR is within goal this morning at 2.16. Will continue warfarin 2mg  daily.    Gardner CandleSheema M Gennavieve Huq, PharmD, BCPS Clinical Pharmacist 09/25/2016,7:21 AM

## 2016-09-25 NOTE — Progress Notes (Signed)
[facility] - [place] at [facility]        Juan Roberson was admitted to the Hospital on 09/24/2016 and Discharged  09/25/2016 and should be excused from work/school   for *3 days starting 09/24/2016 , may return to work/school without any restrictions.  Shaune Pollackhen, Erico Stan M.D on 09/25/2016,at 10:45 AM  [facility] -  at [facility]    Office  [phone]

## 2016-09-25 NOTE — Consult Note (Signed)
[facility] Cardiology Consultation Note  Patient ID: Juan Sachs., MRN: [id number], DOB/AGE: Dec 05, 1970 45 y.o. Admit date: 09/24/2016   Date of Consult: 09/25/2016 Primary Physician: Corky Downs, MD Primary Cardiologist: Park Breed  Chief Complaint:  Chief Complaint  Patient presents with  . Chest Pain   Reason for Consult: chest pain  HPI: 45 y.o. male with known coronary artery disease with previous history of old myocardial infarction essential hypertension makes hyperlipidemia diabetes with complication chronic kidney disease stage IV and anemia having some episodes of chest discomfort increasing in frequency and intensity over the last week or 2 mainly at rest. This is mainly occurring when he eats and he has significant worsening chest discomfort. This is progressive although is not occurring with physical activity. He is not short of breath or other having significant lower extremity edema or other symptoms with physical activity consistent more with gastroesophageal reflux. His EKG is significantly abnormal with showing normal sinus rhythm with left axis deviation and right bundle branch block but no current evidence of myocardial infarction with a troponin of 0.04. The patient does have a history of paroxysmal nonvalvular atrial fibrillation on appropriate medication management and maintaining normal sinus rhythm at this time on amiodarone as well as carvedilol. The patient does not have any symptoms at this time and is resting comfortably  Past Medical History:  Diagnosis Date  . Cellulitis and abscess of foot    Left-Dr. Ether Griffins  . CKD (chronic kidney disease), stage III   . Diabetes mellitus without complication (HCC)    a. Dx ~ 1996.  . Essential hypertension   . Gastritis    a. 04/2015 hematemesis -> EGD: gastritis, esophagitis, duodenitis.  No active bleeding.  PPI added.  [name] GERD (gastroesophageal reflux disease)   . Left leg DVT (HCC)    a. Dx 05/2015 -> Coumadin.  .  MI (myocardial infarction)   . Osteomyelitis (HCC)    a. 05/2015 L foot.      Surgical History:  Past Surgical History:  Procedure Laterality Date  . ESOPHAGOGASTRODUODENOSCOPY N/A 04/07/2015   Procedure: ESOPHAGOGASTRODUODENOSCOPY (EGD);  Surgeon: Midge Minium, MD;  Location[facility] ENDOSCOPY;  Service: Endoscopy;  Laterality: N/A;  . ESOPHAGOGASTRODUODENOSCOPY (EGD) WITH PROPOFOL Left 06/30/2015   Procedure: ESOPHAGOGASTRODUODENOSCOPY (EGD) WITH PROPOFOL;  Surgeon: Christena Deem, MD;  Location: [facility] ENDOSCOPY;  Service: Endoscopy;  Laterality: Left;  . FOOT SURGERY    . I&D EXTREMITY Left 04/06/2015   Procedure: IRRIGATION AND DEBRIDEMENT EXTREMITY;  Surgeon: Gwyneth Revels, DPM;  Location: [facility] ORS;  Service: Podiatry;  Laterality: Left;  . IRRIGATION AND DEBRIDEMENT FOOT Left 05/21/2015   Procedure: IRRIGATION AND DEBRIDEMENT FOOT;  Surgeon: Linus Galas, MD;  Location: [facility] ORS;  Service: Podiatry;  Laterality: Left;     Home Meds: Prior to Admission medications   Medication Sig Start Date End Date Taking? Authorizing Provider  amiodarone (PACERONE) 400 MG tablet Take 1 tablet (400 mg total) by mouth daily. 07/22/16  Yes Alford Highland, MD  aspirin EC 81 MG EC tablet Take 1 tablet (81 mg total) by mouth daily. Patient taking differently: Take 325 mg by mouth daily.  07/23/16  Yes Richard Renae Gloss, MD  carvedilol (COREG) 6.25 MG tablet Take 1 tablet by mouth 2 (two) times daily. 11/19/15  Yes Historical Provider, MD  cloNIDine (CATAPRES - DOSED IN MG/24 HR) 0.3 mg/24hr patch Place 0.3 mg onto the skin once a week.   Yes Historical Provider, MD  cloNIDine (CATAPRES) 0.2 MG tablet Take 1  tablet (0.2 mg total) by mouth 2 (two) times daily. 07/22/16  Yes Alford Highlandichard Wieting, MD  furosemide (LASIX) 40 MG tablet Take 40 mg by mouth daily. 07/10/16  Yes Historical Provider, MD  methimazole (TAPAZOLE) 10 MG tablet Take 1 tablet (10 mg total) by mouth daily. 07/22/16  Yes Richard Renae GlossWieting, MD  TOUJEO  SOLOSTAR 300 UNIT/ML SOPN Inject 25 Units into the skin every morning. 07/03/16  Yes Historical Provider, MD  warfarin (COUMADIN) 2 MG tablet Take 1 tablet by mouth daily. 09/02/16  Yes Historical Provider, MD  feeding supplement, GLUCERNA SHAKE, (GLUCERNA SHAKE) LIQD Take 237 mLs by mouth 2 (two) times daily between meals. Patient not taking: Reported on 09/24/2016 07/22/16   Alford Highlandichard Wieting, MD  triamcinolone cream (KENALOG) 0.1 % Apply twice a day to skin rash Patient not taking: Reported on 09/24/2016 07/22/16   Alford Highlandichard Wieting, MD    Inpatient Medications:  . amiodarone  400 mg Oral Daily  . aspirin EC  325 mg Oral Daily  . carvedilol  6.25 mg Oral BID  . cloNIDine  0.2 mg Oral BID  . insulin aspart  0-5 Units Subcutaneous QHS  . insulin aspart  0-9 Units Subcutaneous TID WC  . insulin glargine  25 Units Subcutaneous BH-[id number]  . methimazole  10 mg Oral Daily  . pantoprazole  40 mg Oral Daily  . warfarin  2 mg Oral ONCE-1800  . Warfarin - Pharmacist Dosing Inpatient   Does not apply q1800   . sodium chloride 75 mL/hr at 09/25/16 0451  . heparin 1,300 Units/hr (09/25/16 0451)    Allergies:  Allergies  Allergen Reactions  . Sulfur Other (See Comments)    Pt states that this medication causes renal failure.      Social History   Social History  . Marital status: Single    Spouse name: N/A  . Number of children: 3  . Years of education: N/A   Occupational History  . Not on file.   Social History Main Topics  . Smoking status: Never Smoker  . Smokeless tobacco: Never Used  . Alcohol use No  . Drug use: No  . Sexual activity: No   Other Topics Concern  . Not on file   Social History Narrative   Single, lives alone, 3 children     Family History  Problem Relation Age of Onset  . Coronary artery disease Father   . Pancreatic cancer Mother   . Breast cancer Sister   . Lung cancer Brother   . Cervical cancer Sister      Review of Systems Positive for Chest  pain gastroesophageal reflux Negative for: General:  chills, fever, night sweats or weight changes.  Cardiovascular: PND orthopnea syncope dizziness  Dermatological skin lesions rashes Respiratory: Cough congestion Urologic: Frequent urination urination at night and hematuria Abdominal: negative for nausea, vomiting, diarrhea, bright red blood per rectum, melena, or hematemesis Neurologic: negative for visual changes, and/or hearing changes  All other systems reviewed and are otherwise negative except as noted above.  Labs:  Recent Labs  09/24/16 1300  TROPONINI 0.05*   Lab Results  Component Value Date   WBC 11.3 (H) 09/24/2016   HGB 9.5 (L) 09/24/2016   HCT 29.3 (L) 09/24/2016   MCV 85.6 09/24/2016   PLT 157 09/24/2016    Recent Labs Lab 09/24/16 1300 09/24/16 1309  NA 138  --   K 4.7  --   CL 108  --   CO2 15*  --  BUN 67*  --   CREATININE 3.28*  --   CALCIUM 9.7  --   PROT  --  7.6  BILITOT  --  1.5*  ALKPHOS  --  136*  ALT  --  17  AST  --  37  GLUCOSE 198*  --    Lab Results  Component Value Date   CHOL 164 06/18/2013   HDL 27 (L) 06/18/2013   LDLCALC 117 (H) 06/18/2013   TRIG 98 06/18/2013   No results found for: DDIMER  Radiology/Studies:  Dg Chest Portable 1 View  Result Date: 09/24/2016 CLINICAL DATA:  Chest pain today. EXAM: PORTABLE CHEST 1 VIEW COMPARISON:  Single-view of the chest 07/21/2016. PA and lateral chest 07/17/2016. FINDINGS: The lungs are clear. There is cardiomegaly. No pneumothorax or pleural effusion. IMPRESSION: No acute disease. Electronically Signed   By: Drusilla Kannerhomas  Dalessio M.D.   On: 09/24/2016 14:02    EKG: Sinus tachycardia with left axis deviation and right bundle-branch block  Weights: Filed Weights   09/24/16 1257 09/24/16 1633  Weight: 84.4 kg (186 lb) 88.2 kg (194 lb 8 oz)     Physical Exam: Blood pressure (!) 166/84, pulse (!) 127, temperature 99.1 F (37.3 C), temperature source Oral, resp. rate 15, height  6\' 2"  (1.88 m), weight 88.2 kg (194 lb 8 oz), SpO2 97 %. Body mass index is 24.97 kg/m. General: Well developed, well nourished, in no acute distress. Head eyes ears nose throat: Normocephalic, atraumatic, sclera non-icteric, no xanthomas, nares are without discharge. No apparent thyromegaly and/or mass  Lungs: Normal respiratory effort.  no wheezes, no rales, no rhonchi.  Heart: RRR with normal S1 S2. no murmur gallop, no rub, PMI is normal size and placement, carotid upstroke normal without bruit, jugular venous pressure is normal Abdomen: Soft, non-tender, non-distended with normoactive bowel sounds. No hepatomegaly. No rebound/guarding. No obvious abdominal masses. Abdominal aorta is normal size without bruit Extremities: No edema. no cyanosis, no clubbing, no ulcers  Peripheral : 2+ bilateral upper extremity pulses, 2+ bilateral femoral pulses, 2+ bilateral dorsal pedal pulse Neuro: Alert and oriented. No facial asymmetry. No focal deficit. Moves all extremities spontaneously. Musculoskeletal: Normal muscle tone without kyphosis Psych:  Responds to questions appropriately with a normal affect.    Assessment: 45 year old male with essential hypertension makes hyperlipidemia diabetes with complications old myocardial infarction anemia chronic kidney disease and paroxysmal nonvalvular atrial fibrillation in normal rhythm having atypical chest discomfort most consistent with gastroesophageal reflux progressive at this time and without evidence of myocardial infarction Plan: 1. Treatment of chest discomfort which appears more consistent with gastroesophageal reflux with Protonix and or other esophageal treatments 2. Continue amiodarone carvedilol combination although would decrease amiodarone to 200 mg each day due to currently maintaining normal sinus rhythm to reduce possibility of side effects 3. No change in warfarin for further risk reduction in stroke with atrial fibrillation 4.  Hypertension control with combination of clonidine and consideration of calcium channel blocker as necessary 5. Begin ambulation and follow for any further significant symptoms of chest discomfort needing further intervention although if improved would be okay for discharge to home with further medication management and diagnostic testing as an outpatient later this week  Signed, Lamar BlinksBruce J Jariyah Hackley M.D. [facility] 09/25/2016, 6:09 AM

## 2016-09-25 NOTE — Discharge Summary (Addendum)
[facility] - [place] at [facility]   PATIENT NAME: Juan Roberson    MR#:  [id number]  DATE OF BIRTH:  05/08/1971  DATE OF ADMISSION:  09/24/2016   ADMITTING PHYSICIAN: Wyatt Hasteavid K Hower, MD  DATE OF DISCHARGE: 09/25/2016 PRIMARY CARE PHYSICIAN: MASOUD,JAVED, MD   ADMISSION DIAGNOSIS:  Epigastric abdominal pain [R10.13] Tachycardia [R00.0] Chest pain, unspecified type [R07.9] DISCHARGE DIAGNOSIS:  Active Problems:   Chest pain   Acute-on-chronic kidney injury (HCC)  SECONDARY DIAGNOSIS:   Past Medical History:  Diagnosis Date  . Cellulitis and abscess of foot    Left-Dr. Ether GriffinsFowler  . CKD (chronic kidney disease), stage III   . Diabetes mellitus without complication (HCC)    a. Dx ~ 1996.  . Essential hypertension   . Gastritis    a. 04/2015 hematemesis -> EGD: gastritis, esophagitis, duodenitis.  No active bleeding.  PPI added.  [name]. GERD (gastroesophageal reflux disease)   . Left leg DVT (HCC)    a. Dx 05/2015 -> Coumadin.  . MI (myocardial infarction)   . Osteomyelitis (HCC)    a. 05/2015 L foot.   HOSPITAL COURSE:  45 year old Caucasian gentleman history of atrial fibrillation on anticoagulation, presenting with chest pain  1.Chest pain, central: Initiated aspirin and statin therapy, negative cardiac enzymes nitroglycerin when necessary, morphine when necessary. Per Dr. Gwen PoundsKowalski, Treatment of chest discomfort which appears more consistent with gastroesophageal reflux with Protonix and or other esophageal treatments. Continue amiodarone carvedilol combination although would decrease amiodarone to 200 mg each day due to currently maintaining normal sinus rhythm to reduce possibility of side effects  2. Nausea vomiting abdominal pain: Suspect gastritis provide PPI therapy, GI cocktail prn. 3. Atrial fibrillation with subtherapeutic INR started on heparin, INR up to 2.16.  4. Type 2 diabetes continue basal insulin sliding scale coverage 5. Hypertensive  urgency continue home medications add as needed hydralazine 6. aki no ckd: gentle IVf hydration, stable. Hyperkalemia. Given Kayexalate, follow-up BMP as outpatient.  DISCHARGE CONDITIONS:  Stable, discharge to home today CONSULTS OBTAINED:  Treatment Team:  Wyatt Hasteavid K Hower, MD Lamar BlinksBruce J Kowalski, MD DRUG ALLERGIES:   Allergies  Allergen Reactions  . Sulfur Other (See Comments)    Pt states that this medication causes renal failure.     DISCHARGE MEDICATIONS:   Allergies as of 09/25/2016      Reactions   Sulfur Other (See Comments)   Pt states that this medication causes renal failure.        Medication List    TAKE these medications   alum & mag hydroxide-simeth 200-200-20 MG/5ML suspension Commonly known as:  MAALOX/MYLANTA Take 30 mLs by mouth every 6 (six) hours as needed for indigestion or heartburn.   amiodarone 200 MG tablet Commonly known as:  PACERONE Take 1 tablet (200 mg total) by mouth daily. What changed:  medication strength  how much to take   aspirin 81 MG EC tablet Take 1 tablet (81 mg total) by mouth daily. What changed:  how much to take   carvedilol 6.25 MG tablet Commonly known as:  COREG Take 1 tablet by mouth 2 (two) times daily.   cloNIDine 0.3 mg/24hr patch Commonly known as:  CATAPRES - Dosed in mg/24 hr Place 0.3 mg onto the skin once a week.   cloNIDine 0.2 MG tablet Commonly known as:  CATAPRES Take 1 tablet (0.2 mg total) by mouth 2 (two) times daily.   feeding supplement (GLUCERNA SHAKE) Liqd Take 237 mLs by mouth 2 (two)  times daily between meals.   furosemide 40 MG tablet Commonly known as:  LASIX Take 40 mg by mouth daily.   methimazole 10 MG tablet Commonly known as:  TAPAZOLE Take 1 tablet (10 mg total) by mouth daily.   pantoprazole 40 MG tablet Commonly known as:  PROTONIX Take 1 tablet (40 mg total) by mouth daily.   TOUJEO SOLOSTAR 300 UNIT/ML Sopn Generic drug:  Insulin Glargine Inject 25 Units into the  skin every morning.   triamcinolone cream 0.1 % Commonly known as:  KENALOG Apply twice a day to skin rash   warfarin 2 MG tablet Commonly known as:  COUMADIN Take 1 tablet by mouth daily.        DISCHARGE INSTRUCTIONS:  See AVS.  If you experience worsening of your admission symptoms, develop shortness of breath, life threatening emergency, suicidal or homicidal thoughts you must seek medical attention immediately by calling 911 or calling your MD immediately  if symptoms less severe.  You Must read complete instructions/literature along with all the possible adverse reactions/side effects for all the Medicines you take and that have been prescribed to you. Take any new Medicines after you have completely understood and accpet all the possible adverse reactions/side effects.   Please note  You were cared for by a hospitalist during your hospital stay. If you have any questions about your discharge medications or the care you received while you were in the hospital after you are discharged, you can call the unit and asked to speak with the hospitalist on call if the hospitalist that took care of you is not available. Once you are discharged, your primary care physician will handle any further medical issues. Please note that NO REFILLS for any discharge medications will be authorized once you are discharged, as it is imperative that you return to your primary care physician (or establish a relationship with a primary care physician if you do not have one) for your aftercare needs so that they can reassess your need for medications and monitor your lab values.    On the day of Discharge:  VITAL SIGNS:  Blood pressure (!) 161/81, pulse 94, temperature 98.2 F (36.8 C), temperature source Oral, resp. rate 16, height 6\' 2"  (1.88 m), weight 194 lb 9.6 oz (88.3 kg), SpO2 97 %. PHYSICAL EXAMINATION:  GENERAL:  45 y.o.-year-old patient lying in the bed with no acute distress.  EYES: Pupils  equal, round, reactive to light and accommodation. No scleral icterus. Extraocular muscles intact.  HEENT: Head atraumatic, normocephalic. Oropharynx and nasopharynx clear.  NECK:  Supple, no jugular venous distention. No thyroid enlargement, no tenderness.  LUNGS: Normal breath sounds bilaterally, no wheezing, rales,rhonchi or crepitation. No use of accessory muscles of respiration.  CARDIOVASCULAR: S1, S2 normal. No murmurs, rubs, or gallops.  ABDOMEN: Soft, non-tender, non-distended. Bowel sounds present. No organomegaly or mass.  EXTREMITIES: No pedal edema, cyanosis, or clubbing.  NEUROLOGIC: Cranial nerves II through XII are intact. Muscle strength 5/5 in all extremities. Sensation intact. Gait not checked.  PSYCHIATRIC: The patient is alert and oriented x 3.  SKIN: No obvious rash, lesion, or ulcer.  DATA REVIEW:   CBC  Recent Labs Lab 09/25/16 0600  WBC 5.1  HGB 8.8*  HCT 27.5*  PLT 99*    Chemistries   Recent Labs Lab 09/24/16 1309 09/25/16 0600  NA  --  138  K  --  5.3*  CL  --  111  CO2  --  19*  GLUCOSE  --  185*  BUN  --  71*  CREATININE  --  3.25*  CALCIUM  --  8.9  AST 37  --   ALT 17  --   ALKPHOS 136*  --   BILITOT 1.5*  --      Microbiology Results  Results for orders placed or performed during the hospital encounter of 07/21/16  MRSA PCR Screening     Status: Abnormal   Collection Time: 07/21/16  3:43 PM  Result Value Ref Range Status   MRSA by PCR POSITIVE (A) NEGATIVE Final    Comment:        The GeneXpert MRSA Assay (FDA approved for NASAL specimens only), is one component of a comprehensive MRSA colonization surveillance program. It is not intended to diagnose MRSA infection nor to guide or monitor treatment for MRSA infections. RESULT CALLED TO, READ BACK BY AND VERIFIED WITH: MISSY EDEYR 07/21/16 1753 SGD     RADIOLOGY:  Dg Chest Portable 1 View  Result Date: 09/24/2016 CLINICAL DATA:  Chest pain today. EXAM: PORTABLE CHEST  1 VIEW COMPARISON:  Single-view of the chest 07/21/2016. PA and lateral chest 07/17/2016. FINDINGS: The lungs are clear. There is cardiomegaly. No pneumothorax or pleural effusion. IMPRESSION: No acute disease. Electronically Signed   By: Drusilla Kannerhomas  Dalessio M.D.   On: 09/24/2016 14:02     Management plans discussed with the patient, family and they are in agreement.  CODE STATUS:     Code Status Orders        Start     Ordered   09/24/16 1536  Full code  Continuous     09/24/16 1536    Code Status History    Date Active Date Inactive Code Status Order ID Comments User Context   07/21/2016 11:05 AM 07/22/2016  3:11 PM Full Code [id number]  Alford Highlandichard Wieting, MD ED   02/28/2016  1:43 AM 03/01/2016  6:06 PM Full Code [id number]  Arnaldo NatalMichael S Diamond, MD Inpatient   12/06/2015  8:39 PM 12/08/2015  2:51 PM Full Code [id number]  Altamese DillingVaibhavkumar Vachhani, MD Inpatient   11/01/2015  4:00 PM 11/03/2015  2:28 PM Full Code [id number]  Wyatt Hasteavid K Hower, MD ED   06/29/2015  2:30 AM 07/01/2015  2:21 PM Full Code [id number]  Arnaldo NatalMichael S Diamond, MD Inpatient   05/21/2015  4:34 PM 05/25/2015  4:34 PM Full Code [id number]  Linus Galasodd Cline, MD Inpatient   05/18/2015  9:17 PM 05/21/2015  4:34 PM Full Code [id number]  Shaune PollackQing Handy Mcloud, MD Inpatient   04/05/2015  7:05 AM 04/08/2015  2:00 PM Full Code [id number]  Arnaldo NatalMichael S Diamond, MD Inpatient      TOTAL TIME TAKING CARE OF THIS PATIENT: 32 minutes.    Shaune Pollackhen, Mionna Advincula M.D on 09/25/2016 at 10:52 AM  Between 7am to 6pm - Pager - [phone]  After 6pm go to [email or web address] - Social research officer, [email or web address] [email or web address]  [facility] [place] [facility]  Office  [phone]  CC: Primary care physician; Corky DownsMASOUD,JAVED, MD   Note: This dictation was prepared with Dragon dictation along with smaller phrase technology. Any transcriptional errors that result from this process are unintentional.

## 2016-09-25 NOTE — Progress Notes (Signed)
ANTICOAGULATION CONSULT NOTE - Initial Consult  Pharmacy Consult for heparin drip/ warfarin Indication: chest pain/ACS  Allergies  Allergen Reactions  . Sulfur Other (See Comments)    Pt states that this medication causes renal failure.      Patient Measurements: Height: 6\' 2"  (188 cm) Weight: 194 lb 8 oz (88.2 kg) IBW/kg (Calculated) : 82.2 Heparin Dosing Weight: 84.4kg  Vital Signs: Temp: 99.1 F (37.3 C) (12/24 1918) Temp Source: Oral (12/24 1918) BP: 166/84 (12/24 1918) Pulse Rate: 127 (12/24 1918)  Labs:  Recent Labs  09/24/16 1300 09/24/16 1309 09/24/16 2218  HGB 9.5*  --   --   HCT 29.3*  --   --   PLT 157  --   --   APTT  --  32  --   LABPROT  --  20.0*  --   INR  --  1.68  --   HEPARINUNFRC  --   --  0.12*  CREATININE 3.28*  --   --   TROPONINI 0.05*  --   --     Estimated Creatinine Clearance: 32.4 mL/min (by C-G formula based on SCr of 3.28 mg/dL (H)).   Medical History: Past Medical History:  Diagnosis Date  . Cellulitis and abscess of foot    Left-Dr. Ether GriffinsFowler  . CKD (chronic kidney disease), stage III   . Diabetes mellitus without complication (HCC)    a. Dx ~ 1996.  . Essential hypertension   . Gastritis    a. 04/2015 hematemesis -> EGD: gastritis, esophagitis, duodenitis.  No active bleeding.  PPI added.  [name]. GERD (gastroesophageal reflux disease)   . Left leg DVT (HCC)    a. Dx 05/2015 -> Coumadin.  . MI (myocardial infarction)   . Osteomyelitis (HCC)    a. 05/2015 L foot.    Assessment: 45yo male with complaints of chest pain. Patient on warfarin 2mg  daily at home. Last dose at 6am this morning.  Baseline labs ordered.   Date  INR  Dose 12/24  1.68    Goal of Therapy:  Heparin level 0.3-0.7 units/ml Monitor platelets by anticoagulation protocol: Yes   Plan:  Give 4000 units bolus x 1 Start heparin infusion at 1100 units/hr Check anti-Xa level in 6 hours and daily while on heparin Continue to monitor H&H and platelets  12/24:  INR subtherapeutic @ 1.68. Will give another 2mg  of warfarin tonight and check INR/CBC in the morning.   12/24 2218 HL subtherapeutic. 2650 units IV x 1 bolus and increase rate to 1300 units/hr. Recheck aPTT in 6 hours.  DI: amiodarone and methimazole are home medications.   Carola FrostNathan A Lavern Crimi, PharmD, BCPS Clinical Pharmacist 09/25/2016,5:58 AM

## 2016-09-27 LAB — HEMOGLOBIN A1C
Hgb A1c MFr Bld: 9.3 % — ABNORMAL HIGH (ref 4.8–5.6)
Mean Plasma Glucose: 220 mg/dL

## 2016-10-15 ENCOUNTER — Emergency Department
Admission: EM | Admit: 2016-10-15 | Discharge: 2016-10-15 | Disposition: A | Discharge status: Home / Self Care | Payer: BLUE CROSS/BLUE SHIELD | Attending: Emergency Medicine | Admitting: Emergency Medicine

## 2016-10-15 ENCOUNTER — Emergency Department: Payer: BLUE CROSS/BLUE SHIELD

## 2016-10-15 ENCOUNTER — Encounter: Payer: Self-pay | Admitting: *Deleted

## 2016-10-15 DIAGNOSIS — I252 Old myocardial infarction: Secondary | ICD-10-CM | POA: Insufficient documentation

## 2016-10-15 DIAGNOSIS — J101 Influenza due to other identified influenza virus with other respiratory manifestations: Secondary | ICD-10-CM

## 2016-10-15 DIAGNOSIS — J09X2 Influenza due to identified novel influenza A virus with other respiratory manifestations: Secondary | ICD-10-CM | POA: Insufficient documentation

## 2016-10-15 DIAGNOSIS — Z79899 Other long term (current) drug therapy: Secondary | ICD-10-CM | POA: Diagnosis not present

## 2016-10-15 DIAGNOSIS — Z7982 Long term (current) use of aspirin: Secondary | ICD-10-CM | POA: Diagnosis not present

## 2016-10-15 DIAGNOSIS — N183 Chronic kidney disease, stage 3 (moderate): Secondary | ICD-10-CM | POA: Insufficient documentation

## 2016-10-15 DIAGNOSIS — E1122 Type 2 diabetes mellitus with diabetic chronic kidney disease: Secondary | ICD-10-CM | POA: Insufficient documentation

## 2016-10-15 DIAGNOSIS — E860 Dehydration: Secondary | ICD-10-CM | POA: Insufficient documentation

## 2016-10-15 DIAGNOSIS — I129 Hypertensive chronic kidney disease with stage 1 through stage 4 chronic kidney disease, or unspecified chronic kidney disease: Secondary | ICD-10-CM | POA: Insufficient documentation

## 2016-10-15 DIAGNOSIS — R197 Diarrhea, unspecified: Secondary | ICD-10-CM | POA: Diagnosis present

## 2016-10-15 LAB — CBC
HEMATOCRIT: 30.7 % — AB (ref 40.0–52.0)
HEMOGLOBIN: 9.9 g/dL — AB (ref 13.0–18.0)
MCH: 26.9 pg (ref 26.0–34.0)
MCHC: 32.1 g/dL (ref 32.0–36.0)
MCV: 83.8 fL (ref 80.0–100.0)
Platelets: 109 10*3/uL — ABNORMAL LOW (ref 150–440)
RBC: 3.66 MIL/uL — ABNORMAL LOW (ref 4.40–5.90)
RDW: 15.9 % — AB (ref 11.5–14.5)
WBC: 11.3 10*3/uL — ABNORMAL HIGH (ref 3.8–10.6)

## 2016-10-15 LAB — URINALYSIS, COMPLETE (UACMP) WITH MICROSCOPIC
Bilirubin Urine: NEGATIVE
Glucose, UA: >=500 mg/dL — AB
Ketones, ur: NEGATIVE mg/dL
Leukocytes, UA: NEGATIVE
Nitrite: NEGATIVE
SPECIFIC GRAVITY, URINE: 1.015 (ref 1.005–1.030)
pH: 5 (ref 5.0–8.0)

## 2016-10-15 LAB — COMPREHENSIVE METABOLIC PANEL
ALBUMIN: 2.9 g/dL — AB (ref 3.5–5.0)
ALT: 16 U/L — ABNORMAL LOW (ref 17–63)
ANION GAP: 9 (ref 5–15)
AST: 35 U/L (ref 15–41)
Alkaline Phosphatase: 108 U/L (ref 38–126)
BUN: 75 mg/dL — AB (ref 6–20)
CHLORIDE: 99 mmol/L — AB (ref 101–111)
CO2: 21 mmol/L — AB (ref 22–32)
Calcium: 8.8 mg/dL — ABNORMAL LOW (ref 8.9–10.3)
Creatinine, Ser: 3.96 mg/dL — ABNORMAL HIGH (ref 0.61–1.24)
GFR calc Af Amer: 19 mL/min — ABNORMAL LOW (ref >60)
GFR calc non Af Amer: 17 mL/min — ABNORMAL LOW (ref >60)
GLUCOSE: 378 mg/dL — AB (ref 65–99)
POTASSIUM: 4.8 mmol/L (ref 3.5–5.1)
SODIUM: 129 mmol/L — AB (ref 135–145)
Total Bilirubin: 1.4 mg/dL — ABNORMAL HIGH (ref 0.3–1.2)
Total Protein: 6.9 g/dL (ref 6.5–8.1)

## 2016-10-15 LAB — LIPASE, BLOOD: Lipase: 20 U/L (ref 11–51)

## 2016-10-15 MED ORDER — ONDANSETRON 4 MG PO TBDP: 4.0000 mg | ORAL_TABLET | Freq: Three times a day (TID) | ORAL | 0 refills | Status: DC | PRN

## 2016-10-15 MED ORDER — ONDANSETRON 4 MG PO TBDP
4.0000 mg | ORAL_TABLET | Freq: Once | ORAL | Status: AC
  Administered 2016-10-15: 4 mg via ORAL
  Filled 2016-10-15: qty 1

## 2016-10-15 MED ORDER — OSELTAMIVIR PHOSPHATE 30 MG PO CAPS: 30.0000 mg | ORAL_CAPSULE | Freq: Every day | ORAL | 0 refills | Status: DC

## 2016-10-15 MED ORDER — HYDROCOD POLST-CPM POLST ER 10-8 MG/5ML PO SUER: 5.0000 mL | Freq: Two times a day (BID) | ORAL | 0 refills | Status: DC

## 2016-10-15 MED ORDER — SODIUM CHLORIDE 0.9 % IV SOLN
Freq: Once | INTRAVENOUS | Status: AC
  Administered 2016-10-15: 16:00:00 via INTRAVENOUS

## 2016-10-15 MED ORDER — HYDROMORPHONE HCL 1 MG/ML IJ SOLN
0.5000 mg | Freq: Once | INTRAMUSCULAR | Status: AC
  Administered 2016-10-15: 0.5 mg via INTRAVENOUS
  Filled 2016-10-15: qty 1

## 2016-10-15 NOTE — ED Triage Notes (Signed)
Pt arrived to ED reporting a cough,  NVD x 1 week with chest pain when coughing. PT reports having had a fever last night of 102 and reports having taken Theraflu with no relief from symptoms. Pt reports muscle aches at this time. Pt denies SOB and denies nausea at this time. Pt verbalized he has not eaten since Friday but states he is able to keep water down.

## 2016-10-15 NOTE — ED Provider Notes (Signed)
[facility] Emergency Department Provider Note        Time seen: ----------------------------------------- 3:32 PM on 10/15/2016 -----------------------------------------    I have reviewed the triage vital signs and the nursing notes.   HISTORY  Chief Complaint Cough; Fever; and Chest Pain    HPI Juan BlossomWaltzie L Deyton Jr. is a 46 y.o. male who presents to ER for cough, nausea, vomiting and diarrhea for the last week. Patient has had chest pain with coughing. He reports he had a fever last night 202, he's been taking TheraFlu with no improvement. He has had some muscle aches, denies shortness of breath but has had vomiting and diarrhea. He reports he cannot keep water down. Nothing makes symptoms better.   Past Medical History:  Diagnosis Date  . Cellulitis and abscess of foot    Left-Dr. Ether GriffinsFowler  . CKD (chronic kidney disease), stage III   . Diabetes mellitus without complication (HCC)    a. Dx ~ 1996.  . Essential hypertension   . Gastritis    a. 04/2015 hematemesis -> EGD: gastritis, esophagitis, duodenitis.  No active bleeding.  PPI added.  [name]. GERD (gastroesophageal reflux disease)   . Left leg DVT (HCC)    a. Dx 05/2015 -> Coumadin.  . MI (myocardial infarction)   . Osteomyelitis (HCC)    a. 05/2015 L foot.    Patient Active Problem List   Diagnosis Date Noted  . Chest pain 09/24/2016  . Acute-on-chronic kidney injury (HCC) 09/24/2016    Past Surgical History:  Procedure Laterality Date  . ESOPHAGOGASTRODUODENOSCOPY N/A 04/07/2015   Procedure: ESOPHAGOGASTRODUODENOSCOPY (EGD);  Surgeon: Midge Miniumarren Wohl, MD;  [facility];  Service: Endoscopy;  Laterality: N/A;  . ESOPHAGOGASTRODUODENOSCOPY (EGD) WITH PROPOFOL Left 06/30/2015   Procedure: ESOPHAGOGASTRODUODENOSCOPY (EGD) WITH PROPOFOL;  Surgeon: Christena DeemMartin U Skulskie, MD;  Location[facility] ENDOSCOPY;  Service: Endoscopy;  Laterality: Left;  . FOOT SURGERY    . I&D EXTREMITY Left 04/06/2015   Procedure: IRRIGATION AND DEBRIDEMENT EXTREMITY;  Surgeon: Gwyneth RevelsJustin Fowler, DPM;  Location: [facility] ORS;  Service: Podiatry;  Laterality: Left;  . IRRIGATION AND DEBRIDEMENT FOOT Left 05/21/2015   Procedure: IRRIGATION AND DEBRIDEMENT FOOT;  Surgeon: Linus Galasodd Cline, MD;  Location: [facility] ORS;  Service: Podiatry;  Laterality: Left;    Allergies Sulfur  Social History Social History  Substance Use Topics  . Smoking status: Never Smoker  . Smokeless tobacco: Never Used  . Alcohol use No    Review of Systems Constitutional: Positive for fever Cardiovascular: Positive for chest pain with coughing Respiratory: Negative for shortness of breath. Positive for cough Gastrointestinal: Negative for abdominal pain, positive for vomiting and diarrhea Genitourinary: Negative for dysuria. Musculoskeletal: Negative for back pain. Skin: Negative for rash. Neurological: Negative for headaches, focal weakness or numbness.  10-point ROS otherwise negative.  ____________________________________________   PHYSICAL EXAM:  VITAL SIGNS: ED Triage Vitals  Enc Vitals Group     BP 10/15/16 1302 132/63     Pulse Rate 10/15/16 1302 78     Resp 10/15/16 1302 18     Temp 10/15/16 1302 99.1 F (37.3 C)     Temp Source 10/15/16 1302 Oral     SpO2 10/15/16 1302 96 %     Weight 10/15/16 1303 194 lb (88 kg)     Height 10/15/16 1303 6\' 2"  (1.88 m)     Head Circumference --      Peak Flow --      Pain Score 10/15/16 1304 10  Pain Loc --      Pain Edu? --      Excl. in GC? --    Constitutional: Alert and oriented. Well appearing and in no distress. Eyes: Conjunctivae are normal. PERRL. Normal extraocular movements. ENT   Head: Normocephalic and atraumatic.   Nose: No congestion/rhinnorhea.   Mouth/Throat: Mucous membranes are moist.   Neck: No stridor. Cardiovascular: Normal rate, regular rhythm. No murmurs, rubs, or gallops. Respiratory: Normal respiratory effort without tachypnea nor  retractions. Breath sounds are clear and equal bilaterally. No wheezes/rales/rhonchi. Gastrointestinal: Soft and nontender. Normal bowel sounds Musculoskeletal: Nontender with normal range of motion in all extremities. No lower extremity tenderness nor edema. Neurologic:  Normal speech and language. No gross focal neurologic deficits are appreciated.  Skin:  Skin is warm, dry and intact. No rash noted. Psychiatric: Mood and affect are normal. Speech and behavior are normal.  ____________________________________________  EKG: Interpreted by me.Sinus rhythm rate 70 bpm, normal PR interval, wide QRS, long QT, right bundle branch block, left axis deviation  ____________________________________________  ED COURSE:  Pertinent labs & imaging results that were available during my care of the patient were reviewed by me and considered in my medical decision making (see chart for details). Clinical Course   Patient presents to ER for flulike symptoms. We will give IV fluids, antiemetics and pain medicine.  Procedures ____________________________________________   LABS (pertinent positives/negatives)  Labs Reviewed  COMPREHENSIVE METABOLIC PANEL - Abnormal; Notable for the following:       Result Value   Sodium 129 (*)    Chloride 99 (*)    CO2 21 (*)    Glucose, Bld 378 (*)    BUN 75 (*)    Creatinine, Ser 3.96 (*)    Calcium 8.8 (*)    Albumin 2.9 (*)    ALT 16 (*)    Total Bilirubin 1.4 (*)    GFR calc non Af Amer 17 (*)    GFR calc Af Amer 19 (*)    All other components within normal limits  CBC - Abnormal; Notable for the following:    WBC 11.3 (*)    RBC 3.66 (*)    Hemoglobin 9.9 (*)    HCT 30.7 (*)    RDW 15.9 (*)    Platelets 109 (*)    All other components within normal limits  URINALYSIS, COMPLETE (UACMP) WITH MICROSCOPIC - Abnormal; Notable for the following:    Color, Urine YELLOW (*)    APPearance HAZY (*)    Glucose, UA >=500 (*)    Hgb urine dipstick MODERATE  (*)    Protein, ur >=300 (*)    Bacteria, UA RARE (*)    Squamous Epithelial / LPF 0-5 (*)    All other components within normal limits  LIPASE, BLOOD    RADIOLOGY Chest x-ray IMPRESSION: Small bilateral pleural effusions with minimal bibasilar atelectasis.   ____________________________________________  FINAL ASSESSMENT AND PLAN  Influenza, dehydration  Plan: Patient with labs and imaging as dictated above. Patient is currently feeling better, I will prescribe Tamiflu for him due to his comorbidities as well as Zofran and Tussionex. He is stable for outpatient follow-up.   Emily Filbert, MD   Note: This dictation was prepared with Dragon dictation. Any transcriptional errors that result from this process are unintentional    Emily Filbert, MD 10/15/16 1724

## 2016-10-15 NOTE — ED Notes (Signed)
Patient states that since last Saturday he has had cough and runny nose. Patient is getting up white sputum, yesterday he started with N/V/D. Patient states that he has also had fever, Tmax of 102 yesterday. Patient states that he has lost count of the amount times that he has vomited and had diarrhea.

## 2016-10-17 ENCOUNTER — Encounter: Payer: Self-pay | Admitting: Emergency Medicine

## 2016-10-17 ENCOUNTER — Emergency Department: Payer: BLUE CROSS/BLUE SHIELD

## 2016-10-17 ENCOUNTER — Inpatient Hospital Stay
Admission: EM | Admit: 2016-10-17 | Discharge: 2016-10-27 | DRG: 871 | Disposition: A | Discharge status: Skilled Nursing Facility | Payer: BLUE CROSS/BLUE SHIELD | Attending: Internal Medicine | Admitting: Internal Medicine

## 2016-10-17 DIAGNOSIS — I5022 Chronic systolic (congestive) heart failure: Secondary | ICD-10-CM | POA: Diagnosis present

## 2016-10-17 DIAGNOSIS — R262 Difficulty in walking, not elsewhere classified: Secondary | ICD-10-CM

## 2016-10-17 DIAGNOSIS — M659 Synovitis and tenosynovitis, unspecified: Secondary | ICD-10-CM | POA: Diagnosis present

## 2016-10-17 DIAGNOSIS — E875 Hyperkalemia: Secondary | ICD-10-CM | POA: Diagnosis not present

## 2016-10-17 DIAGNOSIS — A4102 Sepsis due to Methicillin resistant Staphylococcus aureus: Principal | ICD-10-CM | POA: Diagnosis present

## 2016-10-17 DIAGNOSIS — B9562 Methicillin resistant Staphylococcus aureus infection as the cause of diseases classified elsewhere: Secondary | ICD-10-CM | POA: Diagnosis present

## 2016-10-17 DIAGNOSIS — D696 Thrombocytopenia, unspecified: Secondary | ICD-10-CM | POA: Diagnosis present

## 2016-10-17 DIAGNOSIS — Z794 Long term (current) use of insulin: Secondary | ICD-10-CM

## 2016-10-17 DIAGNOSIS — E872 Acidosis, unspecified: Secondary | ICD-10-CM

## 2016-10-17 DIAGNOSIS — E871 Hypo-osmolality and hyponatremia: Secondary | ICD-10-CM | POA: Diagnosis present

## 2016-10-17 DIAGNOSIS — E1121 Type 2 diabetes mellitus with diabetic nephropathy: Secondary | ICD-10-CM | POA: Diagnosis present

## 2016-10-17 DIAGNOSIS — E1142 Type 2 diabetes mellitus with diabetic polyneuropathy: Secondary | ICD-10-CM | POA: Diagnosis present

## 2016-10-17 DIAGNOSIS — L03115 Cellulitis of right lower limb: Secondary | ICD-10-CM | POA: Diagnosis present

## 2016-10-17 DIAGNOSIS — E1122 Type 2 diabetes mellitus with diabetic chronic kidney disease: Secondary | ICD-10-CM | POA: Diagnosis present

## 2016-10-17 DIAGNOSIS — M00061 Staphylococcal arthritis, right knee: Secondary | ICD-10-CM | POA: Diagnosis present

## 2016-10-17 DIAGNOSIS — M25569 Pain in unspecified knee: Secondary | ICD-10-CM

## 2016-10-17 DIAGNOSIS — Z7982 Long term (current) use of aspirin: Secondary | ICD-10-CM

## 2016-10-17 DIAGNOSIS — Z8249 Family history of ischemic heart disease and other diseases of the circulatory system: Secondary | ICD-10-CM

## 2016-10-17 DIAGNOSIS — I132 Hypertensive heart and chronic kidney disease with heart failure and with stage 5 chronic kidney disease, or end stage renal disease: Secondary | ICD-10-CM | POA: Diagnosis present

## 2016-10-17 DIAGNOSIS — A419 Sepsis, unspecified organism: Secondary | ICD-10-CM | POA: Diagnosis present

## 2016-10-17 DIAGNOSIS — N179 Acute kidney failure, unspecified: Secondary | ICD-10-CM | POA: Diagnosis present

## 2016-10-17 DIAGNOSIS — E119 Type 2 diabetes mellitus without complications: Secondary | ICD-10-CM | POA: Diagnosis not present

## 2016-10-17 DIAGNOSIS — R778 Other specified abnormalities of plasma proteins: Secondary | ICD-10-CM | POA: Diagnosis present

## 2016-10-17 DIAGNOSIS — N2581 Secondary hyperparathyroidism of renal origin: Secondary | ICD-10-CM | POA: Diagnosis present

## 2016-10-17 DIAGNOSIS — Z86718 Personal history of other venous thrombosis and embolism: Secondary | ICD-10-CM

## 2016-10-17 DIAGNOSIS — Z801 Family history of malignant neoplasm of trachea, bronchus and lung: Secondary | ICD-10-CM

## 2016-10-17 DIAGNOSIS — J111 Influenza due to unidentified influenza virus with other respiratory manifestations: Secondary | ICD-10-CM | POA: Diagnosis present

## 2016-10-17 DIAGNOSIS — I959 Hypotension, unspecified: Secondary | ICD-10-CM | POA: Diagnosis present

## 2016-10-17 DIAGNOSIS — R6520 Severe sepsis without septic shock: Secondary | ICD-10-CM | POA: Diagnosis present

## 2016-10-17 DIAGNOSIS — I2510 Atherosclerotic heart disease of native coronary artery without angina pectoris: Secondary | ICD-10-CM | POA: Diagnosis present

## 2016-10-17 DIAGNOSIS — D631 Anemia in chronic kidney disease: Secondary | ICD-10-CM | POA: Diagnosis present

## 2016-10-17 DIAGNOSIS — N186 End stage renal disease: Secondary | ICD-10-CM | POA: Diagnosis not present

## 2016-10-17 DIAGNOSIS — I10 Essential (primary) hypertension: Secondary | ICD-10-CM | POA: Diagnosis not present

## 2016-10-17 DIAGNOSIS — Z8049 Family history of malignant neoplasm of other genital organs: Secondary | ICD-10-CM

## 2016-10-17 DIAGNOSIS — R197 Diarrhea, unspecified: Secondary | ICD-10-CM | POA: Diagnosis present

## 2016-10-17 DIAGNOSIS — Z800 Family history of malignant neoplasm of digestive organs: Secondary | ICD-10-CM

## 2016-10-17 DIAGNOSIS — I420 Dilated cardiomyopathy: Secondary | ICD-10-CM | POA: Diagnosis present

## 2016-10-17 DIAGNOSIS — K219 Gastro-esophageal reflux disease without esophagitis: Secondary | ICD-10-CM | POA: Diagnosis present

## 2016-10-17 DIAGNOSIS — M94261 Chondromalacia, right knee: Secondary | ICD-10-CM | POA: Diagnosis present

## 2016-10-17 DIAGNOSIS — I252 Old myocardial infarction: Secondary | ICD-10-CM

## 2016-10-17 DIAGNOSIS — Z7901 Long term (current) use of anticoagulants: Secondary | ICD-10-CM

## 2016-10-17 DIAGNOSIS — N183 Chronic kidney disease, stage 3 (moderate): Secondary | ICD-10-CM | POA: Diagnosis not present

## 2016-10-17 DIAGNOSIS — Z79899 Other long term (current) drug therapy: Secondary | ICD-10-CM

## 2016-10-17 DIAGNOSIS — R52 Pain, unspecified: Secondary | ICD-10-CM

## 2016-10-17 DIAGNOSIS — Z803 Family history of malignant neoplasm of breast: Secondary | ICD-10-CM

## 2016-10-17 HISTORY — DX: Heart failure, unspecified: I50.9

## 2016-10-17 HISTORY — DX: Cardiac arrhythmia, unspecified: I49.9

## 2016-10-17 LAB — SYNOVIAL CELL COUNT + DIFF, W/ CRYSTALS
CRYSTALS FLUID: NONE SEEN
Eosinophils-Synovial: 0 %
Lymphocytes-Synovial Fld: 0 %
Monocyte-Macrophage-Synovial Fluid: 2 %
Neutrophil, Synovial: 98 %
Other Cells-SYN: 0
WBC, SYNOVIAL: 22759 /mm3 — AB (ref 0–200)

## 2016-10-17 LAB — BLOOD GAS, VENOUS
ACID-BASE DEFICIT: 6.5 mmol/L — AB (ref 0.0–2.0)
BICARBONATE: 20.2 mmol/L (ref 20.0–28.0)
Patient temperature: 37
pCO2, Ven: 44 mmHg (ref 44.0–60.0)
pH, Ven: 7.27 (ref 7.250–7.430)
pO2, Ven: <31 mmHg — CL (ref 32.0–45.0)

## 2016-10-17 LAB — URINALYSIS, COMPLETE (UACMP) WITH MICROSCOPIC
BILIRUBIN URINE: NEGATIVE
GLUCOSE, UA: 150 mg/dL — AB
Ketones, ur: NEGATIVE mg/dL
Leukocytes, UA: NEGATIVE
NITRITE: NEGATIVE
Protein, ur: 100 mg/dL — AB
SPECIFIC GRAVITY, URINE: 1.016 (ref 1.005–1.030)
pH: 5 (ref 5.0–8.0)

## 2016-10-17 LAB — GLUCOSE, CAPILLARY: Glucose-Capillary: 246 mg/dL — ABNORMAL HIGH (ref 65–99)

## 2016-10-17 LAB — COMPREHENSIVE METABOLIC PANEL
ALBUMIN: 2.9 g/dL — AB (ref 3.5–5.0)
ALK PHOS: 109 U/L (ref 38–126)
ALT: 22 U/L (ref 17–63)
ANION GAP: 14 (ref 5–15)
AST: 61 U/L — ABNORMAL HIGH (ref 15–41)
BILIRUBIN TOTAL: 2.3 mg/dL — AB (ref 0.3–1.2)
BUN: 94 mg/dL — ABNORMAL HIGH (ref 6–20)
CALCIUM: 8.9 mg/dL (ref 8.9–10.3)
CO2: 18 mmol/L — ABNORMAL LOW (ref 22–32)
Chloride: 93 mmol/L — ABNORMAL LOW (ref 101–111)
Creatinine, Ser: 4.98 mg/dL — ABNORMAL HIGH (ref 0.61–1.24)
GFR calc Af Amer: 15 mL/min — ABNORMAL LOW (ref >60)
GFR, EST NON AFRICAN AMERICAN: 13 mL/min — AB (ref >60)
GLUCOSE: 410 mg/dL — AB (ref 65–99)
POTASSIUM: 5 mmol/L (ref 3.5–5.1)
Sodium: 125 mmol/L — ABNORMAL LOW (ref 135–145)
TOTAL PROTEIN: 7.3 g/dL (ref 6.5–8.1)

## 2016-10-17 LAB — LIPASE, BLOOD: Lipase: 24 U/L (ref 11–51)

## 2016-10-17 LAB — CBC
HEMATOCRIT: 32.4 % — AB (ref 40.0–52.0)
HEMOGLOBIN: 10.5 g/dL — AB (ref 13.0–18.0)
MCH: 26.9 pg (ref 26.0–34.0)
MCHC: 32.3 g/dL (ref 32.0–36.0)
MCV: 83.4 fL (ref 80.0–100.0)
Platelets: 151 10*3/uL (ref 150–440)
RBC: 3.89 MIL/uL — ABNORMAL LOW (ref 4.40–5.90)
RDW: 16.1 % — AB (ref 11.5–14.5)
WBC: 19.7 10*3/uL — AB (ref 3.8–10.6)

## 2016-10-17 LAB — LACTIC ACID, PLASMA
LACTIC ACID, VENOUS: 2.2 mmol/L — AB (ref 0.5–1.9)
Lactic Acid, Venous: 2.8 mmol/L (ref 0.5–1.9)

## 2016-10-17 LAB — MRSA PCR SCREENING: MRSA by PCR: POSITIVE — AB

## 2016-10-17 LAB — TSH: TSH: 0.016 u[IU]/mL — ABNORMAL LOW (ref 0.350–4.500)

## 2016-10-17 LAB — PROTIME-INR
INR: 2.05
Prothrombin Time: 23.4 seconds — ABNORMAL HIGH (ref 11.4–15.2)

## 2016-10-17 MED ORDER — WARFARIN - PHYSICIAN DOSING INPATIENT: Freq: Every day | Status: DC

## 2016-10-17 MED ORDER — ONDANSETRON HCL 4 MG/2ML IJ SOLN
4.0000 mg | Freq: Four times a day (QID) | INTRAMUSCULAR | Status: DC | PRN
Start: 2016-10-17 — End: 2016-10-18

## 2016-10-17 MED ORDER — MUPIROCIN 2 % EX OINT
1.0000 "application " | TOPICAL_OINTMENT | Freq: Two times a day (BID) | CUTANEOUS | Status: DC
  Administered 2016-10-18: 1 via NASAL
  Filled 2016-10-17: qty 22

## 2016-10-17 MED ORDER — VANCOMYCIN HCL IN DEXTROSE 1-5 GM/200ML-% IV SOLN
1000.0000 mg | INTRAVENOUS | Status: AC
  Administered 2016-10-17: 1000 mg via INTRAVENOUS
  Filled 2016-10-17: qty 200

## 2016-10-17 MED ORDER — CLONIDINE HCL 0.3 MG/24HR TD PTWK
0.3000 mg | MEDICATED_PATCH | TRANSDERMAL | Status: DC
  Filled 2016-10-17 (×2): qty 1

## 2016-10-17 MED ORDER — PANTOPRAZOLE SODIUM 40 MG PO TBEC
40.0000 mg | DELAYED_RELEASE_TABLET | Freq: Every day | ORAL | Status: DC
  Administered 2016-10-17 – 2016-10-18 (×2): 40 mg via ORAL
  Filled 2016-10-17 (×2): qty 1

## 2016-10-17 MED ORDER — METHIMAZOLE 10 MG PO TABS
10.0000 mg | ORAL_TABLET | Freq: Every day | ORAL | Status: DC
  Administered 2016-10-17 – 2016-10-18 (×2): 10 mg via ORAL
  Filled 2016-10-17 (×2): qty 1

## 2016-10-17 MED ORDER — AMIODARONE HCL 200 MG PO TABS
200.0000 mg | ORAL_TABLET | Freq: Every day | ORAL | Status: DC
  Administered 2016-10-17: 200 mg via ORAL
  Filled 2016-10-17: qty 1

## 2016-10-17 MED ORDER — SODIUM CHLORIDE 0.9 % IV BOLUS (SEPSIS)
1000.0000 mL | Freq: Once | INTRAVENOUS | Status: AC
  Administered 2016-10-17: 1000 mL via INTRAVENOUS

## 2016-10-17 MED ORDER — WARFARIN SODIUM 1 MG PO TABS: 2.0000 mg | ORAL_TABLET | Freq: Every day | ORAL | Status: DC

## 2016-10-17 MED ORDER — ONDANSETRON HCL 4 MG PO TABS: 4.0000 mg | ORAL_TABLET | Freq: Four times a day (QID) | ORAL | Status: DC | PRN

## 2016-10-17 MED ORDER — INSULIN GLARGINE 300 UNIT/ML ~~LOC~~ SOPN: 25.0000 [IU] | PEN_INJECTOR | SUBCUTANEOUS | Status: DC

## 2016-10-17 MED ORDER — LIDOCAINE-EPINEPHRINE (PF) 2 %-1:200000 IJ SOLN: 20.0000 mL | Freq: Once | INTRAMUSCULAR | Status: DC

## 2016-10-17 MED ORDER — HYDROCODONE-ACETAMINOPHEN 5-325 MG PO TABS
1.0000 | ORAL_TABLET | ORAL | Status: DC | PRN
  Administered 2016-10-17 – 2016-10-18 (×3): 1 via ORAL
  Filled 2016-10-17 (×3): qty 1

## 2016-10-17 MED ORDER — MORPHINE SULFATE (PF) 2 MG/ML IV SOLN: 2.0000 mg | INTRAVENOUS | Status: DC | PRN

## 2016-10-17 MED ORDER — INSULIN ASPART 100 UNIT/ML ~~LOC~~ SOLN
10.0000 [IU] | Freq: Once | SUBCUTANEOUS | Status: AC
  Administered 2016-10-17: 10 [IU] via INTRAVENOUS
  Filled 2016-10-17: qty 10

## 2016-10-17 MED ORDER — SODIUM CHLORIDE 0.9% FLUSH
3.0000 mL | Freq: Two times a day (BID) | INTRAVENOUS | Status: DC
  Administered 2016-10-18: 3 mL via INTRAVENOUS

## 2016-10-17 MED ORDER — ASPIRIN 81 MG PO TBEC
81.0000 mg | DELAYED_RELEASE_TABLET | Freq: Every day | ORAL | Status: DC
  Administered 2016-10-17 – 2016-10-18 (×2): 81 mg via ORAL
  Filled 2016-10-17 (×4): qty 1

## 2016-10-17 MED ORDER — FUROSEMIDE 40 MG PO TABS
40.0000 mg | ORAL_TABLET | Freq: Every day | ORAL | Status: DC
  Administered 2016-10-17 – 2016-10-18 (×2): 40 mg via ORAL
  Filled 2016-10-17 (×2): qty 1

## 2016-10-17 MED ORDER — LIDOCAINE HCL 1 % IJ SOLN
10.0000 mL | Freq: Once | INTRAMUSCULAR | Status: AC
Start: 2016-10-17 — End: 2016-10-17
  Administered 2016-10-17: 10 mL via INTRADERMAL
  Filled 2016-10-17: qty 10

## 2016-10-17 MED ORDER — ACETAMINOPHEN 325 MG PO TABS: 650.0000 mg | ORAL_TABLET | Freq: Four times a day (QID) | ORAL | Status: DC | PRN

## 2016-10-17 MED ORDER — CARVEDILOL 6.25 MG PO TABS
6.2500 mg | ORAL_TABLET | Freq: Two times a day (BID) | ORAL | Status: DC
  Administered 2016-10-17 – 2016-10-18 (×2): 6.25 mg via ORAL
  Filled 2016-10-17 (×2): qty 1

## 2016-10-17 MED ORDER — INSULIN ASPART 100 UNIT/ML ~~LOC~~ SOLN
0.0000 [IU] | Freq: Three times a day (TID) | SUBCUTANEOUS | Status: DC
Start: 2016-10-18 — End: 2016-10-18
  Administered 2016-10-18 (×2): 5 [IU] via SUBCUTANEOUS
  Filled 2016-10-17 (×2): qty 5

## 2016-10-17 MED ORDER — ALUM & MAG HYDROXIDE-SIMETH 200-200-20 MG/5ML PO SUSP: 30.0000 mL | Freq: Four times a day (QID) | ORAL | Status: DC | PRN

## 2016-10-17 MED ORDER — CLONIDINE HCL 0.1 MG PO TABS
0.2000 mg | ORAL_TABLET | Freq: Two times a day (BID) | ORAL | Status: DC
  Administered 2016-10-17: 0.2 mg via ORAL
  Filled 2016-10-17: qty 2

## 2016-10-17 MED ORDER — ZOLPIDEM TARTRATE 5 MG PO TABS
5.0000 mg | ORAL_TABLET | Freq: Every evening | ORAL | Status: DC | PRN
  Administered 2016-10-17: 5 mg via ORAL
  Filled 2016-10-17: qty 1

## 2016-10-17 MED ORDER — OSELTAMIVIR PHOSPHATE 30 MG PO CAPS
30.0000 mg | ORAL_CAPSULE | Freq: Every day | ORAL | Status: DC
  Administered 2016-10-18: 30 mg via ORAL
  Filled 2016-10-17 (×2): qty 1

## 2016-10-17 MED ORDER — PIPERACILLIN-TAZOBACTAM 3.375 G IVPB: 3.3750 g | Freq: Two times a day (BID) | INTRAVENOUS | Status: DC

## 2016-10-17 MED ORDER — CEFEPIME-DEXTROSE 2 GM/50ML IV SOLR
2.0000 g | INTRAVENOUS | Status: AC
  Administered 2016-10-17: 2 g via INTRAVENOUS
  Filled 2016-10-17: qty 50

## 2016-10-17 MED ORDER — SODIUM CHLORIDE 0.9 % IV SOLN
INTRAVENOUS | Status: DC
  Administered 2016-10-17: 21:00:00 via INTRAVENOUS

## 2016-10-17 MED ORDER — CHLORHEXIDINE GLUCONATE CLOTH 2 % EX PADS
6.0000 | MEDICATED_PAD | Freq: Every day | CUTANEOUS | Status: DC
  Administered 2016-10-18: 6 via TOPICAL

## 2016-10-17 MED ORDER — ACETAMINOPHEN 650 MG RE SUPP: 650.0000 mg | Freq: Four times a day (QID) | RECTAL | Status: DC | PRN

## 2016-10-17 MED ORDER — INSULIN GLARGINE 100 UNIT/ML ~~LOC~~ SOLN
25.0000 [IU] | SUBCUTANEOUS | Status: DC
  Filled 2016-10-17: qty 0.25

## 2016-10-17 MED ORDER — LIDOCAINE HCL (PF) 1 % IJ SOLN
INTRAMUSCULAR | Status: AC
  Administered 2016-10-17: 10 mL via INTRADERMAL
  Filled 2016-10-17: qty 10

## 2016-10-17 NOTE — H&P (Signed)
[employer] - [place] [facility]   PATIENT NAME: Juan Roberson    MR#:  [id number]  DATE OF BIRTH:  09/29/1971  DATE OF ADMISSION:  10/17/2016  PRIMARY CARE PHYSICIAN: Corky Downs, MD   REQUESTING/REFERRING PHYSICIAN: Dr. Roxan Hockey MD  CHIEF COMPLAINT:   Chief Complaint  Patient presents with  . Influenza  . Abdominal Pain  . Knee Pain    HISTORY OF PRESENT ILLNESS: Juan Roberson  is a 46 y.o. male with a known history of Chronic kidney disease stage III, diabetes, essential hypertension, gastritis, history of DVT in the past was diagnosed with the flu on Sunday. He was started on therapy with Tamiflu. He he he also has been having diarrhea ongoing for the past few days. Which is progressively gotten worse. He states that he was on antibiotics about a month ago. He also has had fevers chills and bodyaches related to the flu. Patient was trying to walk yesterday and twisted his right knee and a swollen. The ER physician That knee results are currently pending. Patient reports that he is not able to walk on her pended. PAST MEDICAL HISTORY:   Past Medical History:  Diagnosis Date  . Cellulitis and abscess of foot    Left-Dr. Ether Griffins  . CKD (chronic kidney disease), stage III   . Diabetes mellitus without complication (HCC)    a. Dx ~ 1996.  . Essential hypertension   . Gastritis    a. 04/2015 hematemesis -> EGD: gastritis, esophagitis, duodenitis.  No active bleeding.  PPI added.  [name] GERD (gastroesophageal reflux disease)   . Left leg DVT (HCC)    a. Dx 05/2015 -> Coumadin.  . MI (myocardial infarction)   . Osteomyelitis (HCC)    a. 05/2015 L foot.    PAST SURGICAL HISTORY: Past Surgical History:  Procedure Laterality Date  . ESOPHAGOGASTRODUODENOSCOPY N/A 04/07/2015   Procedure: ESOPHAGOGASTRODUODENOSCOPY (EGD);  Surgeon: Midge Minium, MD;  Location[facility] ENDOSCOPY;  Service: Endoscopy;  Laterality: N/A;  . ESOPHAGOGASTRODUODENOSCOPY (EGD) WITH PROPOFOL Left  06/30/2015   Procedure: ESOPHAGOGASTRODUODENOSCOPY (EGD) WITH PROPOFOL;  Surgeon: Christena Deem, MD;  Location: [facility] ENDOSCOPY;  Service: Endoscopy;  Laterality: Left;  . FOOT SURGERY    . I&D EXTREMITY Left 04/06/2015   Procedure: IRRIGATION AND DEBRIDEMENT EXTREMITY;  Surgeon: Gwyneth Revels, DPM;  Location: [facility] ORS;  Service: Podiatry;  Laterality: Left;  . IRRIGATION AND DEBRIDEMENT FOOT Left 05/21/2015   Procedure: IRRIGATION AND DEBRIDEMENT FOOT;  Surgeon: Linus Galas, MD;  Location: [facility] ORS;  Service: Podiatry;  Laterality: Left;    SOCIAL HISTORY:  Social History  Substance Use Topics  . Smoking status: Never Smoker  . Smokeless tobacco: Never Used  . Alcohol use No    FAMILY HISTORY:  Family History  Problem Relation Age of Onset  . Coronary artery disease Father   . Pancreatic cancer Mother   . Breast cancer Sister   . Lung cancer Brother   . Cervical cancer Sister     DRUG ALLERGIES:  Allergies  Allergen Reactions  . Sulfur Other (See Comments)    Pt states that this medication causes renal failure.      REVIEW OF SYSTEMS:   CONSTITUTIONAL: Positive fever, positive fatigue or positive weakness.  EYES: No blurred or double vision.  EARS, NOSE, AND THROAT: No tinnitus or ear pain.  RESPIRATORY: No cough, shortness of breath, wheezing or hemoptysis.  CARDIOVASCULAR: No chest pain, orthopnea, edema.  GASTROINTESTINAL: Positive nausea, vomiting, diarrhea no abdominal pain.  GENITOURINARY: No dysuria, hematuria.  ENDOCRINE: No polyuria, nocturia,  HEMATOLOGY: No anemia, easy bruising or bleeding SKIN: Right knee swelling and erythema  MUSCULOSKELETAL: No joint pain or arthritis.   NEUROLOGIC: No tingling, numbness, weakness.  PSYCHIATRY: No anxiety or depression.   MEDICATIONS AT HOME:  Prior to Admission medications   Medication Sig Start Date End Date Taking? Authorizing Provider  alum & mag hydroxide-simeth (MAALOX/MYLANTA) 200-200-20 MG/5ML suspension  Take 30 mLs by mouth every 6 (six) hours as needed for indigestion or heartburn. 09/25/16   Shaune Pollack, MD  amiodarone (PACERONE) 200 MG tablet Take 1 tablet (200 mg total) by mouth daily. 09/25/16   Shaune Pollack, MD  aspirin EC 81 MG EC tablet Take 1 tablet (81 mg total) by mouth daily. Patient taking differently: Take 325 mg by mouth daily.  07/23/16   Alford Highland, MD  carvedilol (COREG) 6.25 MG tablet Take 1 tablet by mouth 2 (two) times daily. 11/19/15   Historical Provider, MD  chlorpheniramine-HYDROcodone (TUSSIONEX PENNKINETIC ER) 10-8 MG/5ML SUER Take 5 mLs by mouth 2 (two) times daily. 10/15/16   Emily Filbert, MD  cloNIDine (CATAPRES - DOSED IN MG/24 HR) 0.3 mg/24hr patch Place 0.3 mg onto the skin once a week.    Historical Provider, MD  cloNIDine (CATAPRES) 0.2 MG tablet Take 1 tablet (0.2 mg total) by mouth 2 (two) times daily. 07/22/16   Alford Highland, MD  feeding supplement, GLUCERNA SHAKE, (GLUCERNA SHAKE) LIQD Take 237 mLs by mouth 2 (two) times daily between meals. Patient not taking: Reported on 09/24/2016 07/22/16   Alford Highland, MD  furosemide (LASIX) 40 MG tablet Take 40 mg by mouth daily. 07/10/16   Historical Provider, MD  methimazole (TAPAZOLE) 10 MG tablet Take 1 tablet (10 mg total) by mouth daily. 07/22/16   Alford Highland, MD  ondansetron (ZOFRAN ODT) 4 MG disintegrating tablet Take 1 tablet (4 mg total) by mouth every 8 (eight) hours as needed for nausea or vomiting. 10/15/16   Emily Filbert, MD  oseltamivir (TAMIFLU) 30 MG capsule Take 1 capsule (30 mg total) by mouth daily. 10/15/16   Emily Filbert, MD  pantoprazole (PROTONIX) 40 MG tablet Take 1 tablet (40 mg total) by mouth daily. 09/25/16   Shaune Pollack, MD  TOUJEO SOLOSTAR 300 UNIT/ML SOPN Inject 25 Units into the skin every morning. 07/03/16   Historical Provider, MD  triamcinolone cream (KENALOG) 0.1 % Apply twice a day to skin rash Patient not taking: Reported on 09/24/2016 07/22/16   Alford Highland, MD  warfarin (COUMADIN) 2 MG tablet Take 1 tablet by mouth daily. 09/02/16   Historical Provider, MD      PHYSICAL EXAMINATION:   VITAL SIGNS: Blood pressure (!) 150/85, pulse (!) 122, temperature 99.5 F (37.5 C), temperature source Oral, resp. rate (!) 22, height 6\' 2"  (1.88 m), weight 194 lb (88 kg), SpO2 97 %.  GENERAL:  46 y.o.-year-old patient lying in the bed with no acute distress.  EYES: Pupils equal, round, reactive to light and accommodation. No scleral icterus. Extraocular muscles intact.  HEENT: Head atraumatic, normocephalic. Oropharynx and nasopharynx clear.  NECK:  Supple, no jugular venous distention. No thyroid enlargement, no tenderness.  LUNGS:Rhonchus breath bilaterally, no wheezing or crackles CARDIOVASCULAR: S1, S2 normal tachycardic. No murmurs, rubs, or gallops.  ABDOMEN: Soft, nontender, nondistended. Bowel sounds present. No organomegaly or mass.  EXTREMITIES: Right knee swelling and erythema noted NEUROLOGIC: Cranial nerves II through XII are intact. Muscle strength 5/5 in all extremities.  Sensation intact. Gait not checked.  PSYCHIATRIC: The patient is alert and oriented x 3.  SKIN: No obvious rash, lesion, or ulcer.   LABORATORY PANEL:   CBC  Recent Labs Lab 10/15/16 1310 10/17/16 1250  WBC 11.3* 19.7*  HGB 9.9* 10.5*  HCT 30.7* 32.4*  PLT 109* 151  MCV 83.8 83.4  MCH 26.9 26.9  MCHC 32.1 32.3  RDW 15.9* 16.1*   ------------------------------------------------------------------------------------------------------------------  Chemistries   Recent Labs Lab 10/15/16 1310 10/17/16 1250  NA 129* 125*  K 4.8 5.0  CL 99* 93*  CO2 21* 18*  GLUCOSE 378* 410*  BUN 75* 94*  CREATININE 3.96* 4.98*  CALCIUM 8.8* 8.9  AST 35 61*  ALT 16* 22  ALKPHOS 108 109  BILITOT 1.4* 2.3*   ------------------------------------------------------------------------------------------------------------------ estimated creatinine clearance is 21.3  mL/min (by C-G formula based on SCr of 4.98 mg/dL (H)). ------------------------------------------------------------------------------------------------------------------ No results for input(s): TSH, T4TOTAL, T3FREE, THYROIDAB in the last 72 hours.  Invalid input(s): FREET3   Coagulation profile No results for input(s): INR, PROTIME in the last 168 hours. ------------------------------------------------------------------------------------------------------------------- No results for input(s): DDIMER in the last 72 hours. -------------------------------------------------------------------------------------------------------------------  Cardiac Enzymes No results for input(s): CKMB, TROPONINI, MYOGLOBIN in the last 168 hours.  Invalid input(s): CK ------------------------------------------------------------------------------------------------------------------ Invalid input(s): POCBNP  ---------------------------------------------------------------------------------------------------------------  Urinalysis    Component Value Date/Time   COLORURINE AMBER (A) 10/17/2016 1251   APPEARANCEUR CLOUDY (A) 10/17/2016 1251   APPEARANCEUR Clear 07/26/2014 1920   LABSPEC 1.016 10/17/2016 1251   LABSPEC 1.012 07/26/2014 1920   PHURINE 5.0 10/17/2016 1251   GLUCOSEU 150 (A) 10/17/2016 1251   GLUCOSEU Negative 07/26/2014 1920   HGBUR MODERATE (A) 10/17/2016 1251   BILIRUBINUR NEGATIVE 10/17/2016 1251   BILIRUBINUR Negative 07/26/2014 1920   KETONESUR NEGATIVE 10/17/2016 1251   PROTEINUR 100 (A) 10/17/2016 1251   NITRITE NEGATIVE 10/17/2016 1251   LEUKOCYTESUR NEGATIVE 10/17/2016 1251   LEUKOCYTESUR Negative 07/26/2014 1920     RADIOLOGY: Dg Chest 2 View  Result Date: 10/17/2016 CLINICAL DATA:  Diagnosed with flu 2 days ago, fever and diarrhea. Twisted RIGHT knee yesterday. History of hypertension, diabetes. EXAM: CHEST  2 VIEW COMPARISON:  Chest radiograph October 15, 2016  FINDINGS: Cardiac silhouette is mildly enlarged and unchanged. Mediastinal silhouette is nonsuspicious. Low lung volumes. Strandy densities LEFT lung base with small bilateral pleural effusions. No pneumothorax. Soft tissue planes and included osseous structures are nonsuspicious. Possible cholelithiasis. IMPRESSION: Stable cardiomegaly. Small LEFT pleural effusion with LEFT lung base atelectasis, less likely pneumonia. Electronically Signed   By: Awilda Metroourtnay  Bloomer M.D.   On: 10/17/2016 17:57   Dg Knee 1-2 Views Right  Result Date: 10/17/2016 CLINICAL DATA:  46 year old male with right knee pain after twisting knee yesterday walking to bathroom. Initial encounter. EXAM: RIGHT KNEE - 1-2 VIEW COMPARISON:  01/07/2016. FINDINGS: Two-view examination without evidence of fracture or dislocation. Lateral soft tissue swelling may be present. Mild medial and lateral tibiofemoral joint space narrowing. Mild to moderate patellofemoral joint degenerative changes. Moderate joint effusion. IMPRESSION: Two-view exam without fracture or dislocation. Tricompartment degenerative changes with joint effusion as noted above. Lateral soft tissue injury is a possibility. Electronically Signed   By: Lacy DuverneySteven  Olson M.D.   On: 10/17/2016 17:59    EKG: Orders placed or performed during the hospital encounter of 10/15/16  . ED EKG  . ED EKG  . EKG    IMPRESSION AND PLAN: Patient is a 46 year old white male recently diagnosed with the flu now presents with sepsis  1. Sepsis multifactorial Due to pneumonia we'll treat with IV Zosyn and vancomycin Right knee infection   2. Pneumonia antibiotics as above will up try to obtain sputum culture  3. Right knee cellulitis and possible septic joint Status post drainage of the joint await cell count Patient unable to walk will obtain MRI of the knee Orthopedic consult  4. Diarrhea Will rule out C. difficile as well as send for comprehensive stool evaluation  5. Diabetes  type 2 Will place on sliding scale insulin continue home regimen  6. History of DVT continue Coumadin pharmacy consult for anticoagulation management  All the records are reviewed and case discussed with ED provider. Management plans discussed with the patient, family and they are in agreement.  CODE STATUS: Code Status History    Date Active Date Inactive Code Status Order ID Comments User Context   09/24/2016  3:36 PM 09/25/2016  2:15 PM Full Code [id number]  Wyatt Haste, MD ED   07/21/2016 11:05 AM 07/22/2016  3:11 PM Full Code [id number]  Alford Highland, MD ED   02/28/2016  1:43 AM 03/01/2016  6:06 PM Full Code [id number]  Arnaldo Natal, MD Inpatient   12/06/2015  8:39 PM 12/08/2015  2:51 PM Full Code [id number]  Altamese Dilling, MD Inpatient   11/01/2015  4:00 PM 11/03/2015  2:28 PM Full Code [id number]  Wyatt Haste, MD ED   06/29/2015  2:30 AM 07/01/2015  2:21 PM Full Code [id number]  Arnaldo Natal, MD Inpatient   05/21/2015  4:34 PM 05/25/2015  4:34 PM Full Code [id number]  Linus Galas, MD Inpatient   05/18/2015  9:17 PM 05/21/2015  4:34 PM Full Code [id number]  Shaune Pollack, MD Inpatient   04/05/2015  7:05 AM 04/08/2015  2:00 PM Full Code [id number]  Arnaldo Natal, MD Inpatient    Advance Directive Documentation   Flowsheet Row Most Recent Value  Type of Advance Directive  Healthcare Power of Attorney  Pre-existing out of facility DNR order (yellow form or pink MOST form)  No data  "MOST" Form in Place?  No data       TOTAL TIME TAKING CARE OF THIS PATIENT: 55 minutes.    Auburn Bilberry M.D on 10/17/2016 at 7:02 PM  Between 7am to 6pm - Pager - [phone]  After 6pm go to [email or web address] - password [email or web address] [facility]  [place] [facility]  Office  [phone]  CC: Primary care physician; Corky Downs, MD

## 2016-10-17 NOTE — ED Triage Notes (Signed)
Pt to ED c/o of diarrhea and epigastric cramping since Sunday after being dx with flu.  Pt also states twisted right knee while walking and being weak.

## 2016-10-17 NOTE — Progress Notes (Signed)
CRITICAL VALUE ALERT  Critical value received:  Lactic Acid  Date of notification:10/17/16  Time of notification:0935  Critical value read back:yes   Nurse who received alert: Priscille KluverVivian Raffael Bugarin  MD notified (1st page): Dr. Anne HahnWillis  Time of first page:  0935  MD notified (2nd page):  Time of second page:  Responding MD:  Dr. Anne HahnWillis  Time MD responded:  22:40

## 2016-10-17 NOTE — ED Notes (Signed)
Lab at bedside for blood draw. States they will be back later to draw second set of blood cultures.

## 2016-10-17 NOTE — Progress Notes (Signed)
ANTIBIOTIC CONSULT NOTE - INITIAL  Pharmacy Consult for Vancomycin and Zosyn  Indication: sepsis  Allergies  Allergen Reactions  . Sulfur Other (See Comments)    Pt states that this medication causes renal failure.      Patient Measurements: Height: 6\' 2"  (188 cm) Weight: 194 lb (88 kg) IBW/kg (Calculated) : 82.2 Adjusted Body Weight:   Vital Signs: Temp: 99.5 F (37.5 C) (01/16 1247) Temp Source: Oral (01/16 1247) BP: 150/85 (01/16 1725) Pulse Rate: 122 (01/16 1725) Intake/Output from previous day: No intake/output data recorded. Intake/Output from this shift: No intake/output data recorded.  Labs:  Recent Labs  10/15/16 1310 10/17/16 1250  WBC 11.3* 19.7*  HGB 9.9* 10.5*  PLT 109* 151  CREATININE 3.96* 4.98*   Estimated Creatinine Clearance: 21.3 mL/min (by C-G formula based on SCr of 4.98 mg/dL (H)). No results for input(s): VANCOTROUGH, VANCOPEAK, VANCORANDOM, GENTTROUGH, GENTPEAK, GENTRANDOM, TOBRATROUGH, TOBRAPEAK, TOBRARND, AMIKACINPEAK, AMIKACINTROU, AMIKACIN in the last 72 hours.   Microbiology: No results found for this or any previous visit (from the past 720 hour(s)).  Medical History: Past Medical History:  Diagnosis Date  . Cellulitis and abscess of foot    Left-Dr. Ether GriffinsFowler  . CKD (chronic kidney disease), stage III   . Diabetes mellitus without complication (HCC)    a. Dx ~ 1996.  . Essential hypertension   . Gastritis    a. 04/2015 hematemesis -> EGD: gastritis, esophagitis, duodenitis.  No active bleeding.  PPI added.  [name]. GERD (gastroesophageal reflux disease)   . Left leg DVT (HCC)    a. Dx 05/2015 -> Coumadin.  . MI (myocardial infarction)   . Osteomyelitis (HCC)    a. 05/2015 L foot.    Medications:   (Not in a hospital admission) Scheduled:  . [START ON 10/18/2016] insulin aspart  0-9 Units Subcutaneous TID WC   Assessment: Pharmacy consulted to dose and monitor Vancomycin and Cefepime. Patient is CKD stage III per H&P however  serum creatinine much higher than baseline. Acute on chronic kidney injury.   Patient previously had Cefepime IV in ED.   Goal of Therapy:  Vancomycin trough level 15-20 mcg/ml  Plan:  Patient received Vancomycin 1 g IV x 1 in ED. Will need to dose per levels for now until renal function is closer to baseline. Will recheck Random Vancomycin level 24 hours post dose and redose based off that level.   Will dose Zosyn as if CrCl is <10 ml/min due to AKI. Zosyn 3.375 g IV q12 hours starting @ 10:00 on 1/17.     Anuhea Gassner D 10/17/2016,7:25 PM

## 2016-10-17 NOTE — ED Provider Notes (Signed)
[facility] Emergency Department Provider Note    None    (approximate)  I have reviewed the triage vital signs and the nursing notes.   HISTORY  Chief Complaint Influenza; Abdominal Pain; and Knee Pain    HPI Juan BlossomWaltzie L Demonbreun Jr. is a 46 y.o. male with recent diagnosis of influenza presents with persistent nausea vomiting as well as diarrhea and right knee pain. States is also had persistent productive cough coughing fit green sputum. States that yesterday he was going to the bathroom and twisted his right knee with subsequent swelling to the right knee. No warmth. Has been able to ambulatory on that knee. States he's had crampy abdominal pain. States he was on Tamiflu without improvement. Does still smoke. Denies any alcohol abuse.   Past Medical History:  Diagnosis Date  . Cellulitis and abscess of foot    Left-Dr. Ether GriffinsFowler  . CKD (chronic kidney disease), stage III   . Diabetes mellitus without complication (HCC)    a. Dx ~ 1996.  . Essential hypertension   . Gastritis    a. 04/2015 hematemesis -> EGD: gastritis, esophagitis, duodenitis.  No active bleeding.  PPI added.  [name]. GERD (gastroesophageal reflux disease)   . Left leg DVT (HCC)    a. Dx 05/2015 -> Coumadin.  . MI (myocardial infarction)   . Osteomyelitis (HCC)    a. 05/2015 L foot.   Family History  Problem Relation Age of Onset  . Coronary artery disease Father   . Pancreatic cancer Mother   . Breast cancer Sister   . Lung cancer Brother   . Cervical cancer Sister    Past Surgical History:  Procedure Laterality Date  . ESOPHAGOGASTRODUODENOSCOPY N/A 04/07/2015   Procedure: ESOPHAGOGASTRODUODENOSCOPY (EGD);  Surgeon: Midge Miniumarren Wohl, MD;  [facility];  Service: Endoscopy;  Laterality: N/A;  . ESOPHAGOGASTRODUODENOSCOPY (EGD) WITH PROPOFOL Left 06/30/2015   Procedure: ESOPHAGOGASTRODUODENOSCOPY (EGD) WITH PROPOFOL;  Surgeon: Christena DeemMartin U Skulskie, MD;  [facility];   Service: Endoscopy;  Laterality: Left;  . FOOT SURGERY    . I&D EXTREMITY Left 04/06/2015   Procedure: IRRIGATION AND DEBRIDEMENT EXTREMITY;  Surgeon: Gwyneth RevelsJustin Fowler, DPM;  Location: [facility] ORS;  Service: Podiatry;  Laterality: Left;  . IRRIGATION AND DEBRIDEMENT FOOT Left 05/21/2015   Procedure: IRRIGATION AND DEBRIDEMENT FOOT;  Surgeon: Linus Galasodd Cline, MD;  Location: [facility] ORS;  Service: Podiatry;  Laterality: Left;   Patient Active Problem List   Diagnosis Date Noted  . Chest pain 09/24/2016  . Acute-on-chronic kidney injury (HCC) 09/24/2016      Prior to Admission medications   Medication Sig Start Date End Date Taking? Authorizing Provider  alum & mag hydroxide-simeth (MAALOX/MYLANTA) 200-200-20 MG/5ML suspension Take 30 mLs by mouth every 6 (six) hours as needed for indigestion or heartburn. 09/25/16   Shaune PollackQing Chen, MD  amiodarone (PACERONE) 200 MG tablet Take 1 tablet (200 mg total) by mouth daily. 09/25/16   Shaune PollackQing Chen, MD  aspirin EC 81 MG EC tablet Take 1 tablet (81 mg total) by mouth daily. Patient taking differently: Take 325 mg by mouth daily.  07/23/16   Alford Highlandichard Wieting, MD  carvedilol (COREG) 6.25 MG tablet Take 1 tablet by mouth 2 (two) times daily. 11/19/15   Historical Provider, MD  chlorpheniramine-HYDROcodone (TUSSIONEX PENNKINETIC ER) 10-8 MG/5ML SUER Take 5 mLs by mouth 2 (two) times daily. 10/15/16   Emily FilbertJonathan E Williams, MD  cloNIDine (CATAPRES - DOSED IN MG/24 HR) 0.3 mg/24hr patch Place 0.3 mg onto the skin  once a week.    Historical Provider, MD  cloNIDine (CATAPRES) 0.2 MG tablet Take 1 tablet (0.2 mg total) by mouth 2 (two) times daily. 07/22/16   Alford Highland, MD  feeding supplement, GLUCERNA SHAKE, (GLUCERNA SHAKE) LIQD Take 237 mLs by mouth 2 (two) times daily between meals. Patient not taking: Reported on 09/24/2016 07/22/16   Alford Highland, MD  furosemide (LASIX) 40 MG tablet Take 40 mg by mouth daily. 07/10/16   Historical Provider, MD  methimazole (TAPAZOLE) 10 MG  tablet Take 1 tablet (10 mg total) by mouth daily. 07/22/16   Alford Highland, MD  ondansetron (ZOFRAN ODT) 4 MG disintegrating tablet Take 1 tablet (4 mg total) by mouth every 8 (eight) hours as needed for nausea or vomiting. 10/15/16   Emily Filbert, MD  oseltamivir (TAMIFLU) 30 MG capsule Take 1 capsule (30 mg total) by mouth daily. 10/15/16   Emily Filbert, MD  pantoprazole (PROTONIX) 40 MG tablet Take 1 tablet (40 mg total) by mouth daily. 09/25/16   Shaune Pollack, MD  TOUJEO SOLOSTAR 300 UNIT/ML SOPN Inject 25 Units into the skin every morning. 07/03/16   Historical Provider, MD  triamcinolone cream (KENALOG) 0.1 % Apply twice a day to skin rash Patient not taking: Reported on 09/24/2016 07/22/16   Alford Highland, MD  warfarin (COUMADIN) 2 MG tablet Take 1 tablet by mouth daily. 09/02/16   Historical Provider, MD    Allergies Sulfur    Social History Social History  Substance Use Topics  . Smoking status: Never Smoker  . Smokeless tobacco: Never Used  . Alcohol use No    Review of Systems Patient denies headaches, rhinorrhea, blurry vision, numbness, shortness of breath, chest pain, edema, cough, abdominal pain, nausea, vomiting, diarrhea, dysuria, fevers, rashes or hallucinations unless otherwise stated above in HPI. ____________________________________________   PHYSICAL EXAM:  VITAL SIGNS: Vitals:   10/17/16 1247  BP: (!) 154/71  Pulse: (!) 109  Resp: 18  Temp: 99.5 F (37.5 C)    Constitutional: Alert ill appearing but in no acute distress. Eyes: Conjunctivae are icteric. PERRL. EOMI. Head: Atraumatic. Nose: No congestion/rhinnorhea. Mouth/Throat: Mucous membranes are dry.  Oropharynx non-erythematous. Neck: No stridor. Painless ROM. No cervical spine tenderness to palpation Hematological/Lymphatic/Immunilogical: No cervical lymphadenopathy. Cardiovascular: Normal rate, regular rhythm. Grossly normal heart sounds.  Good peripheral  circulation. Respiratory: Normal respiratory effort.  No retractions. Lungs CTAB. Gastrointestinal: Soft and nontender. No distention. No abdominal bruits. No CVA tenderness. Musculoskeletal: No lower extremity tenderness nor edema.  Pain with active range of motion of the right knee. Has roughly 15 of painless passive range of motion. There is a right knee effusion. No overlying cellulitis. No erythema. Neurologic:  Normal speech and language. No gross focal neurologic deficits are appreciated. No gait instability. Skin:  Skin is warm, dry and intact. No rash noted. Psychiatric: Mood and affect are normal. Speech and behavior are normal.  ____________________________________________   LABS (all labs ordered are listed, but only abnormal results are displayed)  Results for orders placed or performed during the hospital encounter of 10/17/16 (from the past 24 hour(s))  Lipase, blood     Status: None   Collection Time: 10/17/16 12:50 PM  Result Value Ref Range   Lipase 24 11 - 51 U/L  Comprehensive metabolic panel     Status: Abnormal   Collection Time: 10/17/16 12:50 PM  Result Value Ref Range   Sodium 125 (L) 135 - 145 mmol/L   Potassium 5.0 3.5 - 5.1  mmol/L   Chloride 93 (L) 101 - 111 mmol/L   CO2 18 (L) 22 - 32 mmol/L   Glucose, Bld 410 (H) 65 - 99 mg/dL   BUN 94 (H) 6 - 20 mg/dL   Creatinine, Ser 9.14 (H) 0.61 - 1.24 mg/dL   Calcium 8.9 8.9 - 78.2 mg/dL   Total Protein 7.3 6.5 - 8.1 g/dL   Albumin 2.9 (L) 3.5 - 5.0 g/dL   AST 61 (H) 15 - 41 U/L   ALT 22 17 - 63 U/L   Alkaline Phosphatase 109 38 - 126 U/L   Total Bilirubin 2.3 (H) 0.3 - 1.2 mg/dL   GFR calc non Af Amer 13 (L) >60 mL/min   GFR calc Af Amer 15 (L) >60 mL/min   Anion gap 14 5 - 15  CBC     Status: Abnormal   Collection Time: 10/17/16 12:50 PM  Result Value Ref Range   WBC 19.7 (H) 3.8 - 10.6 K/uL   RBC 3.89 (L) 4.40 - 5.90 MIL/uL   Hemoglobin 10.5 (L) 13.0 - 18.0 g/dL   HCT 95.6 (L) 21.3 - 08.6 %   MCV  83.4 80.0 - 100.0 fL   MCH 26.9 26.0 - 34.0 pg   MCHC 32.3 32.0 - 36.0 g/dL   RDW 57.8 (H) 46.9 - 62.9 %   Platelets 151 150 - 440 K/uL  Urinalysis, Complete w Microscopic     Status: Abnormal   Collection Time: 10/17/16 12:51 PM  Result Value Ref Range   Color, Urine AMBER (A) YELLOW   APPearance CLOUDY (A) CLEAR   Specific Gravity, Urine 1.016 1.005 - 1.030   pH 5.0 5.0 - 8.0   Glucose, UA 150 (A) NEGATIVE mg/dL   Hgb urine dipstick MODERATE (A) NEGATIVE   Bilirubin Urine NEGATIVE NEGATIVE   Ketones, ur NEGATIVE NEGATIVE mg/dL   Protein, ur 528 (A) NEGATIVE mg/dL   Nitrite NEGATIVE NEGATIVE   Leukocytes, UA NEGATIVE NEGATIVE   RBC / HPF 0-5 0 - 5 RBC/hpf   WBC, UA 6-30 0 - 5 WBC/hpf   Bacteria, UA RARE (A) NONE SEEN   Squamous Epithelial / LPF 0-5 (A) NONE SEEN   Budding Yeast PRESENT    ____________________________________________  EKG____________________________________________  RADIOLOGY  I personally reviewed all radiographic images ordered to evaluate for the above acute complaints and reviewed radiology reports and findings.  These findings were personally discussed with the patient.  Please see medical record for radiology report.  ____________________________________________   PROCEDURES  Procedure(s) performed:  .Joint Aspiration/Arthrocentesis Date/Time: 10/17/2016 9:21 PM Performed by: Willy Eddy Authorized by: Willy Eddy   Consent:    Consent obtained:  Verbal   Consent given by:  Patient Location:    Location:  Knee Anesthesia (see MAR for exact dosages):    Anesthesia method:  Local infiltration   Local anesthetic:  Lidocaine 1% w/o epi Procedure details:    Preparation: Patient was prepped and draped in usual sterile fashion     Needle gauge:  18 G   Approach:  Lateral   Aspirate amount:  50   Aspirate characteristics:  Blood-tinged and purulent   Specimen collected: yes   Post-procedure details:    Dressing:  Adhesive  bandage   Patient tolerance of procedure:  Tolerated well, no immediate complications      Critical Care performed: yes CRITICAL CARE Performed by: Willy Eddy   Total critical care time: 40 minutes  Critical care time was exclusive of separately billable procedures  and treating other patients.  Critical care was necessary to treat or prevent imminent or life-threatening deterioration.  Critical care was time spent personally by me on the following activities: development of treatment plan with patient and/or surrogate as well as nursing, discussions with consultants, evaluation of patient's response to treatment, examination of patient, obtaining history from patient or surrogate, ordering and performing treatments and interventions, ordering and review of laboratory studies, ordering and review of radiographic studies, pulse oximetry and re-evaluation of patient's condition.  ____________________________________________   INITIAL IMPRESSION / ASSESSMENT AND PLAN / ED COURSE  Pertinent labs & imaging results that were available during my care of the patient were reviewed by me and considered in my medical decision making (see chart for details).  DDX:flu, sepsis, dehydration, aki, septic arthtiis  Juan Blossom. is a 46 y.o. who presents to the ED with presents with worsening symptoms status post diagnosis with flu. Patient with low-grade fever but tachycardic and normotensive. Patient is ill-appearing. Blood work ordered to evaluate for sepsis secondary bacterial infection. Patient with markedly leukocytosis with a left shift. Also with lactic acidosis and a KI with a acute metabolic acidosis. Urine without any evidence of infection. Chest x-ray without any evidence of consolidation. Based on his recent antibiotic exposure and sepsis broad-spectrum antibiotics ordered to cover the patient. He was provided with IV fluids for his tachycardia. Joint aspiration of the right knee  performed due to concern for arthritis as source of infection. Not overtly purulent possibly traumatic in nature given complaint of twisting his knee with sudden pain and worsening swelling. No overlying cellulitis or warmth. Pain did improve after expiration.   I spoke with Dr. Allena Katz hospitalist group who currently agrees to admit patient for further evaluation and management.  Clinical Course      ____________________________________________   FINAL CLINICAL IMPRESSION(S) / ED DIAGNOSES  Final diagnoses:  Knee pain, acute  Sepsis, due to unspecified organism (HCC)  AKI (acute kidney injury) (HCC)  Acidosis, metabolic      NEW MEDICATIONS STARTED DURING THIS VISIT:  New Prescriptions   No medications on file     Note:  This document was prepared using Dragon voice recognition software and may include unintentional dictation errors.    Willy Eddy, MD 10/17/16 2123

## 2016-10-17 NOTE — ED Notes (Signed)
Patient transported to radiology

## 2016-10-17 NOTE — ED Triage Notes (Signed)
Pt dx with flu 2 days ago, reports diarrhea since and fever. Pt also reports bilateral knee pain.

## 2016-10-18 ENCOUNTER — Encounter
Admission: EM | Disposition: A | Discharge status: Skilled Nursing Facility | Payer: Self-pay | Source: Home / Self Care | Attending: Internal Medicine

## 2016-10-18 ENCOUNTER — Inpatient Hospital Stay: Payer: BLUE CROSS/BLUE SHIELD

## 2016-10-18 ENCOUNTER — Encounter: Payer: Self-pay | Admitting: Anesthesiology

## 2016-10-18 ENCOUNTER — Inpatient Hospital Stay: Payer: BLUE CROSS/BLUE SHIELD | Admitting: Anesthesiology

## 2016-10-18 ENCOUNTER — Inpatient Hospital Stay
Admit: 2016-10-18 | Discharge: 2016-10-18 | Disposition: A | Discharge status: Home / Self Care | Payer: BLUE CROSS/BLUE SHIELD | Attending: Infectious Diseases | Admitting: Infectious Diseases

## 2016-10-18 HISTORY — PX: IRRIGATION AND DEBRIDEMENT KNEE: SHX5185

## 2016-10-18 HISTORY — PX: KNEE ARTHROSCOPY: SHX127

## 2016-10-18 LAB — BLOOD CULTURE ID PANEL (REFLEXED)
ACINETOBACTER BAUMANNII: NOT DETECTED
CANDIDA ALBICANS: NOT DETECTED
CANDIDA PARAPSILOSIS: NOT DETECTED
Candida glabrata: NOT DETECTED
Candida krusei: NOT DETECTED
Candida tropicalis: NOT DETECTED
ENTEROBACTERIACEAE SPECIES: NOT DETECTED
ENTEROCOCCUS SPECIES: NOT DETECTED
ESCHERICHIA COLI: NOT DETECTED
Enterobacter cloacae complex: NOT DETECTED
HAEMOPHILUS INFLUENZAE: NOT DETECTED
KLEBSIELLA OXYTOCA: NOT DETECTED
Klebsiella pneumoniae: NOT DETECTED
Listeria monocytogenes: NOT DETECTED
METHICILLIN RESISTANCE: DETECTED — AB
Neisseria meningitidis: NOT DETECTED
PSEUDOMONAS AERUGINOSA: NOT DETECTED
Proteus species: NOT DETECTED
STAPHYLOCOCCUS AUREUS BCID: DETECTED — AB
STREPTOCOCCUS AGALACTIAE: NOT DETECTED
STREPTOCOCCUS PNEUMONIAE: NOT DETECTED
STREPTOCOCCUS PYOGENES: NOT DETECTED
STREPTOCOCCUS SPECIES: NOT DETECTED
Serratia marcescens: NOT DETECTED
Staphylococcus species: DETECTED — AB

## 2016-10-18 LAB — BASIC METABOLIC PANEL
Anion gap: 8 (ref 5–15)
BUN: 97 mg/dL — AB (ref 6–20)
CHLORIDE: 100 mmol/L — AB (ref 101–111)
CO2: 19 mmol/L — AB (ref 22–32)
CREATININE: 5.18 mg/dL — AB (ref 0.61–1.24)
Calcium: 8 mg/dL — ABNORMAL LOW (ref 8.9–10.3)
GFR calc Af Amer: 14 mL/min — ABNORMAL LOW (ref >60)
GFR calc non Af Amer: 12 mL/min — ABNORMAL LOW (ref >60)
Glucose, Bld: 267 mg/dL — ABNORMAL HIGH (ref 65–99)
POTASSIUM: 4.7 mmol/L (ref 3.5–5.1)
Sodium: 127 mmol/L — ABNORMAL LOW (ref 135–145)

## 2016-10-18 LAB — RAPID HIV SCREEN (HIV 1/2 AB+AG)
HIV 1/2 ANTIBODIES: NONREACTIVE
HIV-1 P24 Antigen - HIV24: NONREACTIVE

## 2016-10-18 LAB — GLUCOSE, CAPILLARY
GLUCOSE-CAPILLARY: 308 mg/dL — AB (ref 65–99)
GLUCOSE-CAPILLARY: 193 mg/dL — AB (ref 65–99)
Glucose-Capillary: 240 mg/dL — ABNORMAL HIGH (ref 65–99)
Glucose-Capillary: 276 mg/dL — ABNORMAL HIGH (ref 65–99)

## 2016-10-18 LAB — CBC
HEMATOCRIT: 26.4 % — AB (ref 40.0–52.0)
HEMOGLOBIN: 8.4 g/dL — AB (ref 13.0–18.0)
MCH: 26.4 pg (ref 26.0–34.0)
MCHC: 31.8 g/dL — AB (ref 32.0–36.0)
MCV: 83 fL (ref 80.0–100.0)
Platelets: 93 10*3/uL — ABNORMAL LOW (ref 150–440)
RBC: 3.18 MIL/uL — ABNORMAL LOW (ref 4.40–5.90)
RDW: 16 % — ABNORMAL HIGH (ref 11.5–14.5)
WBC: 12.6 10*3/uL — ABNORMAL HIGH (ref 3.8–10.6)

## 2016-10-18 LAB — ECHOCARDIOGRAM COMPLETE
Height: 74 in
WEIGHTICAEL: 3108.8 [oz_av]

## 2016-10-18 LAB — SEDIMENTATION RATE: SED RATE: 63 mm/h — AB (ref 0–15)

## 2016-10-18 LAB — PROCALCITONIN: Procalcitonin: 13.9 ng/mL

## 2016-10-18 LAB — C-REACTIVE PROTEIN: CRP: 27.6 mg/dL — AB (ref <1.0)

## 2016-10-18 LAB — LACTIC ACID, PLASMA: Lactic Acid, Venous: 1.5 mmol/L (ref 0.5–1.9)

## 2016-10-18 LAB — PROTIME-INR
INR: 2.18
PROTHROMBIN TIME: 24.6 s — AB (ref 11.4–15.2)

## 2016-10-18 SURGERY — IRRIGATION AND DEBRIDEMENT KNEE
Anesthesia: General | Site: Knee | Laterality: Right | Wound class: Dirty or Infected

## 2016-10-18 MED ORDER — ROCURONIUM BROMIDE 100 MG/10ML IV SOLN
INTRAVENOUS | Status: DC | PRN
  Administered 2016-10-18: 10 mg via INTRAVENOUS
  Administered 2016-10-18: 20 mg via INTRAVENOUS

## 2016-10-18 MED ORDER — SUGAMMADEX SODIUM 500 MG/5ML IV SOLN
INTRAVENOUS | Status: DC | PRN
  Administered 2016-10-18: 176.2 mg via INTRAVENOUS

## 2016-10-18 MED ORDER — SUCCINYLCHOLINE CHLORIDE 20 MG/ML IJ SOLN
INTRAMUSCULAR | Status: DC | PRN
  Administered 2016-10-18: 100 mg via INTRAVENOUS

## 2016-10-18 MED ORDER — FENTANYL CITRATE (PF) 100 MCG/2ML IJ SOLN: 25.0000 ug | INTRAMUSCULAR | Status: DC | PRN

## 2016-10-18 MED ORDER — PROPOFOL 10 MG/ML IV BOLUS
INTRAVENOUS | Status: DC | PRN
Start: 2016-10-18 — End: 2016-10-18
  Administered 2016-10-18: 150 mg via INTRAVENOUS

## 2016-10-18 MED ORDER — PIPERACILLIN-TAZOBACTAM 3.375 G IVPB
3.3750 g | Freq: Two times a day (BID) | INTRAVENOUS | Status: DC
  Administered 2016-10-19: 3.375 g via INTRAVENOUS
  Filled 2016-10-18: qty 50

## 2016-10-18 MED ORDER — SODIUM CHLORIDE 0.9 % IV SOLN
INTRAVENOUS | Status: DC | PRN
  Administered 2016-10-18: 40 ug/min via INTRAVENOUS

## 2016-10-18 MED ORDER — METHOCARBAMOL 500 MG PO TABS
500.0000 mg | ORAL_TABLET | Freq: Four times a day (QID) | ORAL | Status: DC | PRN
  Administered 2016-10-19 – 2016-10-21 (×4): 500 mg via ORAL
  Filled 2016-10-18 (×5): qty 1

## 2016-10-18 MED ORDER — ONDANSETRON HCL 4 MG/2ML IJ SOLN: 4.0000 mg | Freq: Four times a day (QID) | INTRAMUSCULAR | Status: DC | PRN

## 2016-10-18 MED ORDER — NEOMYCIN-POLYMYXIN B GU 40-200000 IR SOLN
Status: DC | PRN
  Administered 2016-10-18: 48 mL

## 2016-10-18 MED ORDER — FENTANYL CITRATE (PF) 100 MCG/2ML IJ SOLN
INTRAMUSCULAR | Status: DC | PRN
  Administered 2016-10-18 (×2): 25 ug via INTRAVENOUS
  Administered 2016-10-18: 50 ug via INTRAVENOUS

## 2016-10-18 MED ORDER — FENTANYL CITRATE (PF) 100 MCG/2ML IJ SOLN
INTRAMUSCULAR | Status: AC
  Filled 2016-10-18: qty 2

## 2016-10-18 MED ORDER — METHOCARBAMOL 1000 MG/10ML IJ SOLN
500.0000 mg | Freq: Four times a day (QID) | INTRAVENOUS | Status: DC | PRN
  Filled 2016-10-18: qty 5

## 2016-10-18 MED ORDER — PIPERACILLIN-TAZOBACTAM 3.375 G IVPB
3.3750 g | Freq: Three times a day (TID) | INTRAVENOUS | Status: DC
  Administered 2016-10-18: 3.375 g via INTRAVENOUS
  Filled 2016-10-18: qty 50

## 2016-10-18 MED ORDER — HYDROCODONE-ACETAMINOPHEN 7.5-325 MG PO TABS
1.0000 | ORAL_TABLET | ORAL | Status: DC | PRN
  Administered 2016-10-18: 1 via ORAL
  Administered 2016-10-19 (×2): 2 via ORAL
  Administered 2016-10-21 – 2016-10-22 (×3): 1 via ORAL
  Filled 2016-10-18 (×3): qty 1
  Filled 2016-10-18 (×2): qty 2
  Filled 2016-10-18 (×2): qty 1

## 2016-10-18 MED ORDER — SODIUM CHLORIDE 0.45 % IV SOLN
INTRAVENOUS | Status: DC
  Administered 2016-10-18: via INTRAVENOUS

## 2016-10-18 MED ORDER — ONDANSETRON HCL 4 MG PO TABS: 4.0000 mg | ORAL_TABLET | Freq: Four times a day (QID) | ORAL | Status: DC | PRN

## 2016-10-18 MED ORDER — INSULIN GLARGINE 100 UNIT/ML ~~LOC~~ SOLN
25.0000 [IU] | Freq: Every day | SUBCUTANEOUS | Status: DC
  Administered 2016-10-18: 25 [IU] via SUBCUTANEOUS
  Filled 2016-10-18 (×2): qty 0.25

## 2016-10-18 MED ORDER — SENNA 8.6 MG PO TABS
1.0000 | ORAL_TABLET | Freq: Two times a day (BID) | ORAL | Status: DC
  Administered 2016-10-19 – 2016-10-25 (×13): 8.6 mg via ORAL
  Filled 2016-10-18 (×14): qty 1

## 2016-10-18 MED ORDER — METOCLOPRAMIDE HCL 5 MG/ML IJ SOLN: 5.0000 mg | Freq: Three times a day (TID) | INTRAMUSCULAR | Status: DC | PRN

## 2016-10-18 MED ORDER — PROPOFOL 10 MG/ML IV BOLUS
INTRAVENOUS | Status: AC
  Filled 2016-10-18: qty 20

## 2016-10-18 MED ORDER — SUGAMMADEX SODIUM 200 MG/2ML IV SOLN
INTRAVENOUS | Status: AC
  Filled 2016-10-18: qty 2

## 2016-10-18 MED ORDER — FLEET ENEMA 7-19 GM/118ML RE ENEM: 1.0000 | ENEMA | Freq: Once | RECTAL | Status: DC | PRN

## 2016-10-18 MED ORDER — SODIUM CHLORIDE 0.9 % IV SOLN
INTRAVENOUS | Status: DC | PRN
  Administered 2016-10-18: 20:00:00 via INTRAVENOUS

## 2016-10-18 MED ORDER — METOCLOPRAMIDE HCL 10 MG PO TABS: 5.0000 mg | ORAL_TABLET | Freq: Three times a day (TID) | ORAL | Status: DC | PRN

## 2016-10-18 MED ORDER — ACETAMINOPHEN 650 MG RE SUPP: 650.0000 mg | Freq: Four times a day (QID) | RECTAL | Status: DC | PRN

## 2016-10-18 MED ORDER — PIPERACILLIN-TAZOBACTAM 3.375 G IVPB
3.3750 g | Freq: Two times a day (BID) | INTRAVENOUS | Status: DC
  Administered 2016-10-18: 3.375 g via INTRAVENOUS
  Filled 2016-10-18: qty 50

## 2016-10-18 MED ORDER — ONDANSETRON HCL 4 MG/2ML IJ SOLN
INTRAMUSCULAR | Status: DC | PRN
  Administered 2016-10-18: 4 mg via INTRAVENOUS

## 2016-10-18 MED ORDER — PROMETHAZINE HCL 25 MG/ML IJ SOLN: 6.2500 mg | INTRAMUSCULAR | Status: DC | PRN

## 2016-10-18 MED ORDER — MORPHINE SULFATE (PF) 4 MG/ML IV SOLN
2.0000 mg | INTRAVENOUS | Status: DC | PRN
  Administered 2016-10-25 (×2): 2 mg via INTRAVENOUS
  Filled 2016-10-18 (×2): qty 1

## 2016-10-18 MED ORDER — MAGNESIUM HYDROXIDE 400 MG/5ML PO SUSP: 30.0000 mL | Freq: Every day | ORAL | Status: DC | PRN

## 2016-10-18 MED ORDER — VANCOMYCIN HCL IN DEXTROSE 1-5 GM/200ML-% IV SOLN
1000.0000 mg | INTRAVENOUS | Status: AC
  Administered 2016-10-18: 1000 mg via INTRAVENOUS
  Filled 2016-10-18: qty 200

## 2016-10-18 MED ORDER — CELECOXIB 200 MG PO CAPS: 200.0000 mg | ORAL_CAPSULE | Freq: Two times a day (BID) | ORAL | Status: DC

## 2016-10-18 MED ORDER — ENOXAPARIN SODIUM 30 MG/0.3ML ~~LOC~~ SOLN: 30.0000 mg | SUBCUTANEOUS | Status: DC

## 2016-10-18 MED ORDER — LIDOCAINE HCL (CARDIAC) 20 MG/ML IV SOLN
INTRAVENOUS | Status: DC | PRN
  Administered 2016-10-18: 30 mg via INTRAVENOUS
  Administered 2016-10-18: 50 mg via INTRAVENOUS

## 2016-10-18 MED ORDER — METOPROLOL TARTRATE 50 MG PO TABS
50.0000 mg | ORAL_TABLET | Freq: Every day | ORAL | Status: DC
  Administered 2016-10-19 – 2016-10-25 (×6): 50 mg via ORAL
  Filled 2016-10-18 (×6): qty 1

## 2016-10-18 MED ORDER — CEFAZOLIN SODIUM-DEXTROSE 2-4 GM/100ML-% IV SOLN
2.0000 g | Freq: Four times a day (QID) | INTRAVENOUS | Status: AC
  Administered 2016-10-19 (×3): 2 g via INTRAVENOUS
  Filled 2016-10-18 (×3): qty 100

## 2016-10-18 MED ORDER — BISACODYL 10 MG RE SUPP: 10.0000 mg | Freq: Every day | RECTAL | Status: DC | PRN

## 2016-10-18 MED ORDER — PHENYLEPHRINE 40 MCG/ML (10ML) SYRINGE FOR IV PUSH (FOR BLOOD PRESSURE SUPPORT)
PREFILLED_SYRINGE | INTRAVENOUS | Status: AC
  Filled 2016-10-18: qty 10

## 2016-10-18 MED ORDER — PHENYLEPHRINE HCL 10 MG/ML IJ SOLN
INTRAMUSCULAR | Status: DC | PRN
  Administered 2016-10-18 (×4): 80 ug via INTRAVENOUS

## 2016-10-18 MED ORDER — ACETAMINOPHEN 325 MG PO TABS: 650.0000 mg | ORAL_TABLET | Freq: Four times a day (QID) | ORAL | Status: DC | PRN

## 2016-10-18 SURGICAL SUPPLY — 32 items
BANDAGE ELASTIC 4 LF NS (GAUZE/BANDAGES/DRESSINGS) IMPLANT
BANDAGE ELASTIC 6 CLIP NS LF (GAUZE/BANDAGES/DRESSINGS) IMPLANT
BLADE AGGRESSIVE PLUS 4.0 (BLADE) ×4 IMPLANT
BNDG ESMARK 4X12 TAN STRL LF (GAUZE/BANDAGES/DRESSINGS) IMPLANT
CANISTER SUCT 1200ML W/VALVE (MISCELLANEOUS) ×4 IMPLANT
CAST PADDING 3X4FT ST 30246 (SOFTGOODS) ×4
CHLORAPREP W/TINT 26ML (MISCELLANEOUS) ×4 IMPLANT
CUFF TOURN 30 STER DUAL PORT (MISCELLANEOUS) IMPLANT
CUFF TOURN SGL QUICK 24 (TOURNIQUET CUFF) ×2
CUFF TRNQT CYL 24X4X40X1 (TOURNIQUET CUFF) ×2 IMPLANT
ELECT REM PT RETURN 9FT ADLT (ELECTROSURGICAL) ×4
ELECTRODE REM PT RTRN 9FT ADLT (ELECTROSURGICAL) ×2 IMPLANT
GAUZE PETRO XEROFOAM 1X8 (MISCELLANEOUS) ×4 IMPLANT
GAUZE SPONGE 4X4 12PLY STRL (GAUZE/BANDAGES/DRESSINGS) ×4 IMPLANT
GLOVE BIO SURGEON STRL SZ8 (GLOVE) ×8 IMPLANT
GOWN STRL REUS W/ TWL LRG LVL3 (GOWN DISPOSABLE) ×4 IMPLANT
GOWN STRL REUS W/TWL LRG LVL3 (GOWN DISPOSABLE) ×4
GOWN STRL REUS W/TWL LRG LVL4 (GOWN DISPOSABLE) IMPLANT
IV LACTATED RINGER IRRG 3000ML (IV SOLUTION) ×8
IV LR IRRIG 3000ML ARTHROMATIC (IV SOLUTION) ×8 IMPLANT
KIT RM TURNOVER STRD PROC AR (KITS) ×4 IMPLANT
MANIFOLD NEPTUNE II (INSTRUMENTS) ×8 IMPLANT
NS IRRIG 1000ML POUR BTL (IV SOLUTION) ×4 IMPLANT
PACK ARTHROSCOPY KNEE (MISCELLANEOUS) ×4 IMPLANT
PACK EXTREMITY ARMC (MISCELLANEOUS) IMPLANT
PAD CAST CTTN 3X4 STRL (SOFTGOODS) ×4 IMPLANT
SET TUBE SUCT SHAVER OUTFL 24K (TUBING) ×4 IMPLANT
STOCKINETTE STRL 4IN 9604848 (GAUZE/BANDAGES/DRESSINGS) ×4 IMPLANT
SUT ETHILON 4 0 P 3 18 (SUTURE) IMPLANT
SYR 30ML LL (SYRINGE) ×4 IMPLANT
TUBING ARTHRO INFLOW-ONLY STRL (TUBING) ×4 IMPLANT
WAND HAND CNTRL MULTIVAC 50 (MISCELLANEOUS) ×4 IMPLANT

## 2016-10-18 NOTE — Progress Notes (Addendum)
[employer] [facility] [place] [facility]   PATIENT NAME: Juan Roberson    MR#:  [id number]  DATE OF BIRTH:  Apr 18, 1971  SUBJECTIVE:  CHIEF COMPLAINT:   Chief Complaint  Patient presents with  . Influenza  . Abdominal Pain  . Knee Pain  still having lots of knee pain and requesting pain meds, girlfriend at bedside REVIEW OF SYSTEMS:  Review of Systems  Constitutional: Positive for chills and fever. Negative for weight loss.  HENT: Negative for nosebleeds and sore throat.   Eyes: Negative for blurred vision.  Respiratory: Negative for cough, shortness of breath and wheezing.   Cardiovascular: Negative for chest pain, orthopnea, leg swelling and PND.  Gastrointestinal: Negative for abdominal pain, constipation, diarrhea, heartburn, nausea and vomiting.  Genitourinary: Negative for dysuria and urgency.  Musculoskeletal: Positive for joint pain. Negative for back pain.  Skin: Negative for rash.  Neurological: Negative for dizziness, speech change, focal weakness and headaches.  Endo/Heme/Allergies: Does not bruise/bleed easily.  Psychiatric/Behavioral: Negative for depression.    DRUG ALLERGIES:   Allergies  Allergen Reactions  . Sulfur Other (See Comments)    Pt states that this medication causes renal failure.     VITALS:  Blood pressure 107/60, pulse 88, temperature 98.2 F (36.8 C), temperature source Oral, resp. rate 18, height 6\' 2"  (1.88 m), weight 88.1 kg (194 lb 4.8 oz), SpO2 98 %. PHYSICAL EXAMINATION:  Physical Exam  Constitutional: He is oriented to person, place, and time and well-developed, well-nourished, and in no distress.  HENT:  Head: Normocephalic and atraumatic.  Eyes: Conjunctivae and EOM are normal. Pupils are equal, round, and reactive to light.  Neck: Normal range of motion. Neck supple. No tracheal deviation present. No thyromegaly present.  Cardiovascular: Normal rate, regular rhythm and normal heart sounds.   Pulmonary/Chest:  Effort normal and breath sounds normal. No respiratory distress. He has no wheezes. He exhibits no tenderness.  Abdominal: Soft. Bowel sounds are normal. He exhibits no distension. There is no tenderness.  Musculoskeletal:       Right knee: He exhibits decreased range of motion and effusion. Tenderness found.  Neurological: He is alert and oriented to person, place, and time. No cranial nerve deficit.  Skin: Skin is warm and dry. No rash noted.  Psychiatric: Mood and affect normal.   LABORATORY PANEL:   CBC  Recent Labs Lab 10/18/16 0011  WBC 12.6*  HGB 8.4*  HCT 26.4*  PLT 93*   ------------------------------------------------------------------------------------------------------------------ Chemistries   Recent Labs Lab 10/17/16 1250 10/18/16 0011  NA 125* 127*  K 5.0 4.7  CL 93* 100*  CO2 18* 19*  GLUCOSE 410* 267*  BUN 94* 97*  CREATININE 4.98* 5.18*  CALCIUM 8.9 8.0*  AST 61*  --   ALT 22  --   ALKPHOS 109  --   BILITOT 2.3*  --    RADIOLOGY:  Dg Chest 2 View  Result Date: 10/17/2016 CLINICAL DATA:  Diagnosed with flu 2 days ago, fever and diarrhea. Twisted RIGHT knee yesterday. History of hypertension, diabetes. EXAM: CHEST  2 VIEW COMPARISON:  Chest radiograph October 15, 2016 FINDINGS: Cardiac silhouette is mildly enlarged and unchanged. Mediastinal silhouette is nonsuspicious. Low lung volumes. Strandy densities LEFT lung base with small bilateral pleural effusions. No pneumothorax. Soft tissue planes and included osseous structures are nonsuspicious. Possible cholelithiasis. IMPRESSION: Stable cardiomegaly. Small LEFT pleural effusion with LEFT lung base atelectasis, less likely pneumonia. Electronically Signed   By: Michel Santee.D.  On: 10/17/2016 17:57   Dg Knee 1-2 Views Right  Result Date: 10/17/2016 CLINICAL DATA:  46 year old male with right knee pain after twisting knee yesterday walking to bathroom. Initial encounter. EXAM: RIGHT KNEE - 1-2  VIEW COMPARISON:  01/07/2016. FINDINGS: Two-view examination without evidence of fracture or dislocation. Lateral soft tissue swelling may be present. Mild medial and lateral tibiofemoral joint space narrowing. Mild to moderate patellofemoral joint degenerative changes. Moderate joint effusion. IMPRESSION: Two-view exam without fracture or dislocation. Tricompartment degenerative changes with joint effusion as noted above. Lateral soft tissue injury is a possibility. Electronically Signed   By: Lacy DuverneySteven  Olson M.D.   On: 10/17/2016 17:59   ASSESSMENT AND PLAN:  Patient is a 46 year old white male recently diagnosed with the flu now presents with sepsis  * Sepsis: present on admission. Unknown etio - continue empiric IV Zosyn and vancomycin started on admission for possible PNA -?  Right knee infection - Await knee fluid c/s - ID c/s - Could be all due to flu, on tamiflu  * Hyponatremia - IVFs and monitor  * Influenza: On Tamiflu - so far only took 1 dose - check flu PCR  * Pneumonia: doubt it, CXR neg, can due to flu - check procalcitonin   * ESRD - Nephro c/s for HD  * Right knee cellulitis and possible septic joint Status post drainage of the joint await cell count Patient unable to walk - await MRI of the knee Orthopedic consult  * Diarrhea Will rule out C. difficile as well as check GI stool panel  * Diabetes type 2 - continue sliding scale insulin - continue lantus and monitor  * History of DVT: no longer taking coumadin (his PCP asked him to stop)     All the records are reviewed and case discussed with Care Management/Social Worker. Management plans discussed with the patient, family (girlfriend at bedside), Nursing and they are in agreement.  CODE STATUS: FULL CODE  TOTAL TIME TAKING CARE OF THIS PATIENT: 35 minutes.   More than 50% of the time was spent in counseling/coordination of care: YES  POSSIBLE D/C IN 2-3 DAYS, DEPENDING ON CLINICAL  CONDITION.   Delfino LovettVipul Bao Coreas M.D on 10/18/2016 at 8:32 AM  Between 7am to 6pm - Pager - [phone]  After 6pm go to [email or web address] - Social research officer, governmentpassword [email or web address] [email or web address]  [facility] [place] [facility]  Office  [phone]  CC: Primary care physician; Corky DownsMASOUD,JAVED, MD  Note: This dictation was prepared with Dragon dictation along with smaller phrase technology. Any transcriptional errors that result from this process are unintentional.

## 2016-10-18 NOTE — Transfer of Care (Signed)
Immediate Anesthesia Transfer of Care Note  Patient: Queen BlossomWaltzie L Denzer Jr.  Procedure(s) Performed: Procedure(s): IRRIGATION AND DEBRIDEMENT KNEE (Right) ARTHROSCOPY KNEE  Patient Location: PACU  Anesthesia Type:General  Level of Consciousness: awake, alert , oriented and patient cooperative  Airway & Oxygen Therapy: Patient Spontanous Breathing and Patient connected to face mask oxygen  Post-op Assessment: Report given to RN and Post -op Vital signs reviewed and stable  Post vital signs: Reviewed and stable  Last Vitals:  Vitals:   10/18/16 0748 10/18/16 2215  BP: 107/60 139/83  Pulse: 88 (!) 53  Resp: 18   Temp: 36.8 C 37.6 C    Last Pain:  Vitals:   10/18/16 1130  TempSrc:   PainSc: 7          Complications: No apparent anesthesia complications

## 2016-10-18 NOTE — Op Note (Signed)
10/17/2016 - 10/18/2016  10:16 PM  PATIENT:  Juan BlossomWaltzie L Imler Jr.  46 y.o. male  PRE-OPERATIVE DIAGNOSIS:  SEPTIC RIGHT KNEE  POST-OPERATIVE DIAGNOSIS:  Same  PROCEDURE:  IRRIGATION AND DEBRIDEMENT KNEE, ARTHROSCOPY KNEE  SURGEON:  Katrell Milhorn E Jaycub Noorani MD  ASST:    ANESTHESIA:   General  EBL:  Minimal  TOURNIQUET TIME:  None  OPERATIVE FINDINGS:  Large amount of thick brown purulence in the knee joint.  Grade 3 chondromalacia medial and lateral femoral condyles and patella.  Acute synovitis.  Anterior cruciate ligament and menisci normal.  No loose bodies.  OPERATIVE PROCEDURE:  The patient was brought to the operating room and underwent general endotracheal anesthesia in the supine position.  The right leg was prepped and draped in sterile fashion.  Patient was already on vancomycin and Zosyn.  A spinal needle was directed into the suprapatellar pouch and pus aspirated from the joint into a syringe for culture.  This was thick brown purulent.  Stab wound was then made anteromedially and a cannula inserted.  A large amount of purulence was expressed through this cannula.  Saline was then injected into the knee through the cannula several times to irrigate and lessen the thickness of the purulence.  Once this was accomplished, arthroscopy was carried out from a inferolateral portal with an accessory portal inferomedially been made.  The joint was thoroughly examined.  The above findings were noted.  Thorough irrigation was then carried out using 12 L of saline with neomycin.  Menisci   and cruciate ligaments were examined and were intact.  There was significant synovitis.  Following completion of irrigation, the stab wounds were closed with 3-0 nylon suture after a medium Hemovac drain was placed in the joint.  A dry sterile dressing was applied and the Hemovac was hooked to suction.  Patient was awakened and taken to recovery in good condition.  Juan HoarHOWARD E Evanny Ellerbe, MD

## 2016-10-18 NOTE — Anesthesia Procedure Notes (Signed)
Procedure Name: Intubation Date/Time: 10/18/2016 8:36 PM Performed by: Lendon Colonel Pre-anesthesia Checklist: Patient identified, Emergency Drugs available, Suction available, Patient being monitored and Timeout performed Patient Re-evaluated:Patient Re-evaluated prior to inductionOxygen Delivery Method: Circle system utilized and Simple face mask Preoxygenation: Pre-oxygenation with 100% oxygen Intubation Type: IV induction Ventilation: Mask ventilation without difficulty Laryngoscope Size: Mac and 3 Grade View: Grade I Tube type: Oral Tube size: 7.0 mm Number of attempts: 1 Airway Equipment and Method: Stylet Placement Confirmation: ETT inserted through vocal cords under direct vision,  positive ETCO2 and breath sounds checked- equal and bilateral Secured at: 21 cm Tube secured with: Tape Dental Injury: Teeth and Oropharynx as per pre-operative assessment

## 2016-10-18 NOTE — Anesthesia Post-op Follow-up Note (Cosign Needed)
Anesthesia QCDR form completed.        

## 2016-10-18 NOTE — Progress Notes (Signed)
*  PRELIMINARY RESULTS* Echocardiogram 2D Echocardiogram has been performed.  Cristela BlueHege, Aseel Uhde 10/18/2016, 2:36 PM

## 2016-10-18 NOTE — Progress Notes (Signed)
ID E note MRSA bacteremia in patient with DM, CKD and recent DX of flu. He also has a R knee swelling following twisitng it.   On admit wbc 19, temp nml. UA 6-30 wbc , CXR with small L effusion and possible infiltrate vs atelectasis Synovial fluid 22K wbc 98% PMNS, GS + GPC in chains and cx pending BCX + MRSA  Recommendations He will need washout of his septic knee Repeat bcx - ordered.  Check esr CRP - ordered TTE - ordered  - since he has septic knee he will need 6 weeks IV vanco so unless he is hemodynamically unstable or concern for valve ring abscess he would not need TEE. Once bcx done today neg x 48 hours will need PICC - given CKD will need neck IJ picc Will follow

## 2016-10-18 NOTE — H&P (Signed)
THE PATIENT WAS SEEN PRIOR TO SURGERY TODAY.  HISTORY, ALLERGIES, HOME MEDICATIONS AND OPERATIVE PROCEDURE WERE REVIEWED. RISKS AND BENEFITS OF SURGERY DISCUSSED WITH PATIENT AGAIN.  NO CHANGES FROM INITIAL HISTORY AND PHYSICAL NOTED.    

## 2016-10-18 NOTE — Progress Notes (Signed)
PHARMACY - PHYSICIAN COMMUNICATION CRITICAL VALUE ALERT - BLOOD CULTURE IDENTIFICATION (BCID)  Results for orders placed or performed during the hospital encounter of 10/17/16  Blood Culture ID Panel (Reflexed) (Collected: 10/17/2016  5:11 PM)  Result Value Ref Range   Enterococcus species NOT DETECTED NOT DETECTED   Listeria monocytogenes NOT DETECTED NOT DETECTED   Staphylococcus species DETECTED (A) NOT DETECTED   Staphylococcus aureus DETECTED (A) NOT DETECTED   Methicillin resistance DETECTED (A) NOT DETECTED   Streptococcus species NOT DETECTED NOT DETECTED   Streptococcus agalactiae NOT DETECTED NOT DETECTED   Streptococcus pneumoniae NOT DETECTED NOT DETECTED   Streptococcus pyogenes NOT DETECTED NOT DETECTED   Acinetobacter baumannii NOT DETECTED NOT DETECTED   Enterobacteriaceae species NOT DETECTED NOT DETECTED   Enterobacter cloacae complex NOT DETECTED NOT DETECTED   Escherichia coli NOT DETECTED NOT DETECTED   Klebsiella oxytoca NOT DETECTED NOT DETECTED   Klebsiella pneumoniae NOT DETECTED NOT DETECTED   Proteus species NOT DETECTED NOT DETECTED   Serratia marcescens NOT DETECTED NOT DETECTED   Haemophilus influenzae NOT DETECTED NOT DETECTED   Neisseria meningitidis NOT DETECTED NOT DETECTED   Pseudomonas aeruginosa NOT DETECTED NOT DETECTED   Candida albicans NOT DETECTED NOT DETECTED   Candida glabrata NOT DETECTED NOT DETECTED   Candida krusei NOT DETECTED NOT DETECTED   Candida parapsilosis NOT DETECTED NOT DETECTED   Candida tropicalis NOT DETECTED NOT DETECTED    Name of physician (or Provider) Contacted: Dr. Sherryll BurgerShah  Changes to prescribed antibiotics required: pt already on vancomycin-continue  Joey Lierman D Trygg Mantz, Pharm.D, BCPS Clinical Pharmacist  10/18/2016  9:37 AM

## 2016-10-18 NOTE — Anesthesia Preprocedure Evaluation (Signed)
Anesthesia Evaluation  Patient identified by MRN, date of birth, ID band Patient awake    Reviewed: Allergy & Precautions, H&P , NPO status , Patient's Chart, lab work & pertinent test results, reviewed documented beta blocker date and time   History of Anesthesia Complications Negative for: history of anesthetic complications  Airway Mallampati: III  TM Distance: >3 FB Neck ROM: full    Dental  (+) Missing, Poor Dentition   Pulmonary shortness of breath, neg sleep apnea, pneumonia, neg COPD, Recent URI ,    Pulmonary exam normal breath sounds clear to auscultation       Cardiovascular Exercise Tolerance: Good hypertension, (-) angina+ CAD, + Past MI and + Peripheral Vascular Disease  (-) Cardiac Stents and (-) CABG Normal cardiovascular exam+ dysrhythmias Atrial Fibrillation + Valvular Problems/Murmurs  Rhythm:regular Rate:Normal     Neuro/Psych negative neurological ROS  negative psych ROS   GI/Hepatic Neg liver ROS, GERD  ,  Endo/Other  diabetes  Renal/GU CRFRenal disease  negative genitourinary   Musculoskeletal   Abdominal   Peds  Hematology negative hematology ROS (+)   Anesthesia Other Findings Past Medical History: No date: Cellulitis and abscess of foot     Comment: Left-Dr. Ether GriffinsFowler No date: CKD (chronic kidney disease), stage III No date: Diabetes mellitus without complication (HCC)     Comment: a. Dx ~ 1996. No date: Essential hypertension No date: Gastritis     Comment: a. 04/2015 hematemesis -> EGD: gastritis,               esophagitis, duodenitis.  No active bleeding.                PPI added. No date: GERD (gastroesophageal reflux disease) No date: Left leg DVT (HCC)     Comment: a. Dx 05/2015 -> Coumadin. No date: MI (myocardial infarction) No date: Osteomyelitis (HCC)     Comment: a. 05/2015 L foot.   Reproductive/Obstetrics negative OB ROS                              Anesthesia Physical Anesthesia Plan  ASA: III  Anesthesia Plan: General   Post-op Pain Management:    Induction:   Airway Management Planned:   Additional Equipment:   Intra-op Plan:   Post-operative Plan:   Informed Consent: I have reviewed the patients History and Physical, chart, labs and discussed the procedure including the risks, benefits and alternatives for the proposed anesthesia with the patient or authorized representative who has indicated his/her understanding and acceptance.   Dental Advisory Given  Plan Discussed with: Anesthesiologist, CRNA and Surgeon  Anesthesia Plan Comments:         Anesthesia Quick Evaluation

## 2016-10-18 NOTE — Consult Note (Signed)
ORTHOPAEDIC CONSULTATION  REQUESTING PHYSICIAN: Delfino Lovett, MD  Chief Complaint: Right knee pain  HPI: Juan Roberson. is a 46 y.o. male who complains of  right knee pain for the past 2 days.  He became ill last week and was seen in the emergency room at [facility] on 10/15/2016.  He was diagnosed with the flu.  On Monday, 10/16/2016 the got up from a chair and twisted his right knee.  He had increasing pain and swelling.  He became sicker and presented to the emergency room again yesterday 10/17/2016.  He was noted to be suspicious for septic.  His knee was aspirated and the fluid was sent to the lab.  The white blood count was elevated at 22,500 with 98% neutrophils.  There were gram-positive cocci seen.  His blood cultures are growing out MRSA.  He complains of increased pain in the knee with inability to bend it or walk on it.  He denies shaking chills.  He has been seen by Dr. Sampson Goon of the infectious disease service who feels that he probably has a septic knee and needs to have this washed out.  I am in agreement with this.  The risks and benefits of surgery were discussed with the patient along with postop protocol.  Advised him that surgery was indicated to remove the infected fluid from his knee and try to prevent arthritis forming in the future and to allow the body to fight the disease better.  He is in agreement with this.  He did eat breakfast so we will put her surgery off until later this afternoon.  Plan to do an arthroscopic washout.  Past Medical History:  Diagnosis Date  . Cellulitis and abscess of foot    Left-Dr. Ether Griffins  . CKD (chronic kidney disease), stage III   . Diabetes mellitus without complication (HCC)    a. Dx ~ 1996.  . Essential hypertension   . Gastritis    a. 04/2015 hematemesis -> EGD: gastritis, esophagitis, duodenitis.  No active bleeding.  PPI added.  [name] GERD (gastroesophageal reflux disease)   . Left leg DVT (HCC)    a. Dx 05/2015 -> Coumadin.  . MI  (myocardial infarction)   . Osteomyelitis (HCC)    a. 05/2015 L foot.   Past Surgical History:  Procedure Laterality Date  . ESOPHAGOGASTRODUODENOSCOPY N/A 04/07/2015   Procedure: ESOPHAGOGASTRODUODENOSCOPY (EGD);  Surgeon: Midge Minium, MD;  Location: [facility] ENDOSCOPY;  Service: Endoscopy;  Laterality: N/A;  . ESOPHAGOGASTRODUODENOSCOPY (EGD) WITH PROPOFOL Left 06/30/2015   Procedure: ESOPHAGOGASTRODUODENOSCOPY (EGD) WITH PROPOFOL;  Surgeon: Christena Deem, MD;  Location: [facility] ENDOSCOPY;  Service: Endoscopy;  Laterality: Left;  . FOOT SURGERY    . I&D EXTREMITY Left 04/06/2015   Procedure: IRRIGATION AND DEBRIDEMENT EXTREMITY;  Surgeon: Gwyneth Revels, DPM;  Location: [facility] ORS;  Service: Podiatry;  Laterality: Left;  . IRRIGATION AND DEBRIDEMENT FOOT Left 05/21/2015   Procedure: IRRIGATION AND DEBRIDEMENT FOOT;  Surgeon: Linus Galas, MD;  Location: [facility] ORS;  Service: Podiatry;  Laterality: Left;   Social History   Social History  . Marital status: Single    Spouse name: N/A  . Number of children: 3  . Years of education: N/A   Social History Main Topics  . Smoking status: Never Smoker  . Smokeless tobacco: Never Used  . Alcohol use No  . Drug use: No  . Sexual activity: No   Other Topics Concern  . None   Social History Narrative   Single, lives  alone, 3 children   Family History  Problem Relation Age of Onset  . Coronary artery disease Father   . Pancreatic cancer Mother   . Breast cancer Sister   . Lung cancer Brother   . Cervical cancer Sister    Allergies  Allergen Reactions  . Sulfur Other (See Comments)    Pt states that this medication causes renal failure.     Prior to Admission medications   Medication Sig Start Date End Date Taking? Authorizing Provider  alum & mag hydroxide-simeth (MAALOX/MYLANTA) 200-200-20 MG/5ML suspension Take 30 mLs by mouth every 6 (six) hours as needed for indigestion or heartburn. 09/25/16  Yes Shaune PollackQing Chen, MD  amiodarone (PACERONE) 200  MG tablet Take 1 tablet (200 mg total) by mouth daily. 09/25/16  Yes Shaune PollackQing Chen, MD  aspirin EC 81 MG EC tablet Take 1 tablet (81 mg total) by mouth daily. Patient taking differently: Take 325 mg by mouth daily.  07/23/16  Yes Alford Highlandichard Wieting, MD  cloNIDine (CATAPRES - DOSED IN MG/24 HR) 0.3 mg/24hr patch Place 0.3 mg onto the skin once a week.   Yes Historical Provider, MD  cloNIDine (CATAPRES) 0.2 MG tablet Take 1 tablet (0.2 mg total) by mouth 2 (two) times daily. Patient taking differently: Take 0.2 mg by mouth 2 (two) times daily. PT TAKES 1 TAB EVERY MORNING, AND 2 AT BEDTIME 07/22/16  Yes Alford Highlandichard Wieting, MD  furosemide (LASIX) 40 MG tablet Take 40 mg by mouth daily. 07/10/16  Yes Historical Provider, MD  methimazole (TAPAZOLE) 10 MG tablet Take 1 tablet (10 mg total) by mouth daily. 07/22/16  Yes Alford Highlandichard Wieting, MD  metoprolol (LOPRESSOR) 50 MG tablet Take 50 mg by mouth daily. 07/10/16  Yes Historical Provider, MD  ondansetron (ZOFRAN ODT) 4 MG disintegrating tablet Take 1 tablet (4 mg total) by mouth every 8 (eight) hours as needed for nausea or vomiting. 10/15/16  Yes Emily FilbertJonathan E Williams, MD  oseltamivir (TAMIFLU) 30 MG capsule Take 1 capsule (30 mg total) by mouth daily. 10/15/16  Yes Emily FilbertJonathan E Williams, MD  pantoprazole (PROTONIX) 40 MG tablet Take 1 tablet (40 mg total) by mouth daily. 09/25/16  Yes Shaune PollackQing Chen, MD  TOUJEO SOLOSTAR 300 UNIT/ML SOPN Inject 25 Units into the skin every morning.  07/03/16  Yes Historical Provider, MD  chlorpheniramine-HYDROcodone (TUSSIONEX PENNKINETIC ER) 10-8 MG/5ML SUER Take 5 mLs by mouth 2 (two) times daily. Patient not taking: Reported on 10/17/2016 10/15/16   Emily FilbertJonathan E Williams, MD  feeding supplement, GLUCERNA SHAKE, (GLUCERNA SHAKE) LIQD Take 237 mLs by mouth 2 (two) times daily between meals. Patient not taking: Reported on 09/24/2016 07/22/16   Alford Highlandichard Wieting, MD  triamcinolone cream (KENALOG) 0.1 % Apply twice a day to skin rash Patient not taking:  Reported on 09/24/2016 07/22/16   Alford Highlandichard Wieting, MD   Dg Chest 2 View  Result Date: 10/17/2016 CLINICAL DATA:  Diagnosed with flu 2 days ago, fever and diarrhea. Twisted RIGHT knee yesterday. History of hypertension, diabetes. EXAM: CHEST  2 VIEW COMPARISON:  Chest radiograph October 15, 2016 FINDINGS: Cardiac silhouette is mildly enlarged and unchanged. Mediastinal silhouette is nonsuspicious. Low lung volumes. Strandy densities LEFT lung base with small bilateral pleural effusions. No pneumothorax. Soft tissue planes and included osseous structures are nonsuspicious. Possible cholelithiasis. IMPRESSION: Stable cardiomegaly. Small LEFT pleural effusion with LEFT lung base atelectasis, less likely pneumonia. Electronically Signed   By: Awilda Metroourtnay  Bloomer M.D.   On: 10/17/2016 17:57   Dg Knee 1-2 Views Right  Result Date: 10/17/2016 CLINICAL DATA:  46 year old male with right knee pain after twisting knee yesterday walking to bathroom. Initial encounter. EXAM: RIGHT KNEE - 1-2 VIEW COMPARISON:  01/07/2016. FINDINGS: Two-view examination without evidence of fracture or dislocation. Lateral soft tissue swelling may be present. Mild medial and lateral tibiofemoral joint space narrowing. Mild to moderate patellofemoral joint degenerative changes. Moderate joint effusion. IMPRESSION: Two-view exam without fracture or dislocation. Tricompartment degenerative changes with joint effusion as noted above. Lateral soft tissue injury is a possibility. Electronically Signed   By: Lacy Duverney M.D.   On: 10/17/2016 17:59   Mr Knee Right Wo Contrast  Result Date: 10/18/2016 CLINICAL DATA:  46 year old with severe right knee pain and inability to walk. Possible septic knee or cellulitis. History of diabetes, chronic kidney disease and hypertension. EXAM: MRI OF THE RIGHT KNEE WITHOUT CONTRAST TECHNIQUE: Multiplanar, multisequence MR imaging of the knee was performed. No intravenous contrast was administered.  COMPARISON:  Radiographs 10/17/2016. FINDINGS: MENISCI Medial meniscus: Mild degeneration. No evidence of meniscal tear or displaced fragment. Lateral meniscus:  Intact with normal morphology. LIGAMENTS Cruciates:  Intact. Collaterals:  Intact. CARTILAGE Patellofemoral: Moderate patellofemoral chondral thinning, surface irregularity and subchondral cyst formation laterally in the patella and trochlea. Medial: Mild chondral thinning and surface irregularity without focal defect. There is mild subchondral cyst formation peripherally in the medial tibial plateau. Lateral: Mild chondral thinning and surface irregularity with small osteophytes. MISCELLANEOUS Joint:  Moderate-sized mildly complex appearing joint effusion. Popliteal Fossa:  Unremarkable. No significant Baker's cyst. Extensor Mechanism:  Intact. Bones: No acute osseous findings are demonstrated. There is no cortical destruction or subchondral edema to suggest infection. Other: There is mild subcutaneous edema surrounding the knee, greatest anteriorly and laterally. No focal fluid collections are seen. IMPRESSION: 1. Nonspecific subcutaneous edema surrounding the knee, greatest anterolaterally. This could reflect cellulitis. 2. Nonspecific moderate size knee joint effusion. No specific evidence of septic arthritis or osteomyelitis. Arthrocentesis should be considered if there is strong clinical concern of infection. 3. Tricompartmental degenerative changes, greatest within the patellofemoral compartment. 4. The menisci, cruciate and collateral ligaments are intact. Electronically Signed   By: Carey Bullocks M.D.   On: 10/18/2016 10:44    Positive ROS: All other systems have been reviewed and were otherwise negative with the exception of those mentioned in the HPI and as above.  Physical Exam: General: Alert, no acute distress Cardiovascular: No pedal edema Respiratory: No cyanosis, no use of accessory musculature GI: No organomegaly, abdomen is  soft and non-tender Skin: No lesions in the area of chief complaint Neurologic: Sensation intact distally Psychiatric: Patient is competent for consent with normal mood and affect Lymphatic: No axillary or cervical lymphadenopathy  MUSCULOSKELETAL: The right knee is swollen but not severely reddened.  It is tender to palpation.  He keeps it flexed about 45 and resist further movement.  Neurovascular status good distally.  There is no significant redness.  No significant adenopathy is palpable.  Assessment: Septic right knee, probably with MRSA  Plan: Arthroscopic irrigation, debridement, right knee later today.  I discussed this with the patient and with Dr. Nicki Reaper  His hospitalist who agrees.    Valinda Hoar, MD [phone]   10/18/2016 12:49 PM

## 2016-10-18 NOTE — Consult Note (Signed)
Pharmacy Antibiotic Note  Juan BlossomWaltzie L Ambrosius Jr. is a 46 y.o. male admitted on 10/17/2016 with MRSA bacteremia, septic knee, possible PNA.  Pharmacy has been consulted for vancomycin and zosyn dosing.  Plan: Pt received 1g of vancomycin in the ED last night. Will give another 1g STAT for a total load of 2g. Pt currently is in AKI with a scr much higher than baseline. Predicted dose is 1g q 36 hours. Since pt renal function is unstable, will draw a level at 35 hours after last dose to assure that level is not significantly elevated. This will NOT be steady state. Will dose initially off of levels.  Zosyn 3.375g q 12hr due to unstable renal function and CrCl very close to <5220ml/min.   Height: 6\' 2"  (188 cm) Weight: 194 lb 4.8 oz (88.1 kg) IBW/kg (Calculated) : 82.2  Temp (24hrs), Avg:98.4 F (36.9 C), Min:97.9 F (36.6 C), Max:99.5 F (37.5 C)   Recent Labs Lab 10/15/16 1310 10/17/16 1250 10/17/16 1712 10/17/16 2045 10/18/16 0011  WBC 11.3* 19.7*  --   --  12.6*  CREATININE 3.96* 4.98*  --   --  5.18*  LATICACIDVEN  --   --  2.8* 2.2* 1.5    Estimated Creatinine Clearance: 20.5 mL/min (by C-G formula based on SCr of 5.18 mg/dL (H)).    Allergies  Allergen Reactions  . Sulfur Other (See Comments)    Pt states that this medication causes renal failure.      Antimicrobials this admission: vancomycin 1/16 >>  zosyn 1/16 >>   Dose adjustments this admission:   Microbiology results: 1/16 BCx: MRSA 2/4 so far 1/16 MRSA PCR: positive Joint fluid: few gram positive cocci in pair  Thank you for allowing pharmacy to be a part of this patient's care.  Olene FlossMelissa D Anjelika Ausburn, Pharm.D, BCPS Clinical Pharmacist  10/18/2016 11:13 AM

## 2016-10-19 ENCOUNTER — Encounter: Payer: Self-pay | Admitting: Specialist

## 2016-10-19 LAB — BASIC METABOLIC PANEL
Anion gap: 12 (ref 5–15)
BUN: 107 mg/dL — ABNORMAL HIGH (ref 6–20)
CALCIUM: 8.4 mg/dL — AB (ref 8.9–10.3)
CO2: 17 mmol/L — ABNORMAL LOW (ref 22–32)
CREATININE: 5.59 mg/dL — AB (ref 0.61–1.24)
Chloride: 101 mmol/L (ref 101–111)
GFR, EST AFRICAN AMERICAN: 13 mL/min — AB (ref >60)
GFR, EST NON AFRICAN AMERICAN: 11 mL/min — AB (ref >60)
Glucose, Bld: 197 mg/dL — ABNORMAL HIGH (ref 65–99)
Potassium: 5.1 mmol/L (ref 3.5–5.1)
SODIUM: 130 mmol/L — AB (ref 135–145)

## 2016-10-19 LAB — GLUCOSE, CAPILLARY
GLUCOSE-CAPILLARY: 217 mg/dL — AB (ref 65–99)
Glucose-Capillary: 189 mg/dL — ABNORMAL HIGH (ref 65–99)
Glucose-Capillary: 193 mg/dL — ABNORMAL HIGH (ref 65–99)
Glucose-Capillary: 165 mg/dL — ABNORMAL HIGH (ref 65–99)

## 2016-10-19 MED ORDER — INSULIN ASPART 100 UNIT/ML ~~LOC~~ SOLN
0.0000 [IU] | Freq: Three times a day (TID) | SUBCUTANEOUS | Status: DC
  Administered 2016-10-19 (×2): 2 [IU] via SUBCUTANEOUS
  Administered 2016-10-19: 3 [IU] via SUBCUTANEOUS
  Administered 2016-10-19: 2 [IU] via SUBCUTANEOUS
  Administered 2016-10-20: 1 [IU] via SUBCUTANEOUS
  Administered 2016-10-20: 2 [IU] via SUBCUTANEOUS
  Administered 2016-10-20 – 2016-10-21 (×4): 1 [IU] via SUBCUTANEOUS
  Administered 2016-10-22 (×4): 2 [IU] via SUBCUTANEOUS
  Administered 2016-10-23 (×2): 1 [IU] via SUBCUTANEOUS
  Administered 2016-10-23 – 2016-10-24 (×3): 2 [IU] via SUBCUTANEOUS
  Administered 2016-10-24: 3 [IU] via SUBCUTANEOUS
  Administered 2016-10-24 (×2): 7 [IU] via SUBCUTANEOUS
  Administered 2016-10-25: 2 [IU] via SUBCUTANEOUS
  Administered 2016-10-25 (×2): 5 [IU] via SUBCUTANEOUS
  Administered 2016-10-25: 7 [IU] via SUBCUTANEOUS
  Administered 2016-10-26: 3 [IU] via SUBCUTANEOUS
  Administered 2016-10-26: 5 [IU] via SUBCUTANEOUS
  Administered 2016-10-27: 7 [IU] via SUBCUTANEOUS
  Filled 2016-10-19 (×2): qty 2
  Filled 2016-10-19: qty 1
  Filled 2016-10-19: qty 2
  Filled 2016-10-19: qty 5
  Filled 2016-10-19 (×2): qty 3
  Filled 2016-10-19: qty 7
  Filled 2016-10-19 (×3): qty 2
  Filled 2016-10-19: qty 7
  Filled 2016-10-19 (×2): qty 1
  Filled 2016-10-19: qty 5
  Filled 2016-10-19 (×2): qty 1
  Filled 2016-10-19 (×2): qty 2
  Filled 2016-10-19: qty 3
  Filled 2016-10-19: qty 2
  Filled 2016-10-19: qty 1
  Filled 2016-10-19: qty 2
  Filled 2016-10-19: qty 7
  Filled 2016-10-19: qty 1
  Filled 2016-10-19 (×2): qty 2
  Filled 2016-10-19: qty 7

## 2016-10-19 MED ORDER — METHIMAZOLE 10 MG PO TABS
10.0000 mg | ORAL_TABLET | Freq: Every day | ORAL | Status: DC
  Administered 2016-10-20 – 2016-10-24 (×5): 10 mg via ORAL
  Filled 2016-10-19 (×6): qty 1

## 2016-10-19 MED ORDER — VANCOMYCIN HCL IN DEXTROSE 1-5 GM/200ML-% IV SOLN
1000.0000 mg | INTRAVENOUS | Status: DC
  Administered 2016-10-20: 1000 mg via INTRAVENOUS
  Filled 2016-10-19: qty 200

## 2016-10-19 MED ORDER — AMIODARONE HCL 200 MG PO TABS
200.0000 mg | ORAL_TABLET | Freq: Every day | ORAL | Status: DC
  Administered 2016-10-21 – 2016-10-25 (×5): 200 mg via ORAL
  Filled 2016-10-19 (×5): qty 1

## 2016-10-19 MED ORDER — CLONIDINE HCL 0.3 MG/24HR TD PTWK
0.3000 mg | MEDICATED_PATCH | TRANSDERMAL | Status: DC
  Filled 2016-10-19: qty 1

## 2016-10-19 MED ORDER — INSULIN ASPART 100 UNIT/ML ~~LOC~~ SOLN: 0.0000 [IU] | Freq: Three times a day (TID) | SUBCUTANEOUS | Status: DC

## 2016-10-19 MED ORDER — CHLORHEXIDINE GLUCONATE CLOTH 2 % EX PADS
6.0000 | MEDICATED_PAD | Freq: Every day | CUTANEOUS | Status: AC
  Administered 2016-10-19: 6 via TOPICAL
  Administered 2016-10-20: 2 via TOPICAL
  Administered 2016-10-21 – 2016-10-23 (×3): 6 via TOPICAL

## 2016-10-19 MED ORDER — BUPIVACAINE-EPINEPHRINE 0.25% -1:200000 IJ SOLN
INTRAMUSCULAR | Status: DC | PRN
  Administered 2016-10-18: 30 mL

## 2016-10-19 MED ORDER — PANTOPRAZOLE SODIUM 40 MG PO TBEC
40.0000 mg | DELAYED_RELEASE_TABLET | Freq: Every day | ORAL | Status: DC
  Administered 2016-10-19 – 2016-10-24 (×6): 40 mg via ORAL
  Filled 2016-10-19 (×6): qty 1

## 2016-10-19 MED ORDER — SODIUM CHLORIDE 0.9 % IV BOLUS (SEPSIS)
500.0000 mL | Freq: Once | INTRAVENOUS | Status: AC
  Administered 2016-10-20: 500 mL via INTRAVENOUS

## 2016-10-19 MED ORDER — DEXTROMETHORPHAN POLISTIREX ER 30 MG/5ML PO SUER
30.0000 mg | Freq: Two times a day (BID) | ORAL | Status: DC
  Administered 2016-10-19 – 2016-10-26 (×13): 30 mg via ORAL
  Filled 2016-10-19 (×18): qty 5

## 2016-10-19 MED ORDER — MUPIROCIN 2 % EX OINT
1.0000 "application " | TOPICAL_OINTMENT | Freq: Two times a day (BID) | CUTANEOUS | Status: AC
  Administered 2016-10-19 – 2016-10-23 (×10): 1 via NASAL
  Filled 2016-10-19: qty 22

## 2016-10-19 MED ORDER — ASPIRIN 325 MG PO TABS
325.0000 mg | ORAL_TABLET | Freq: Every day | ORAL | Status: DC
  Administered 2016-10-19 – 2016-10-24 (×6): 325 mg via ORAL
  Filled 2016-10-19 (×6): qty 1

## 2016-10-19 MED ORDER — INSULIN GLARGINE 100 UNIT/ML ~~LOC~~ SOLN
25.0000 [IU] | Freq: Every day | SUBCUTANEOUS | Status: DC
  Administered 2016-10-19: 25 [IU] via SUBCUTANEOUS
  Filled 2016-10-19 (×3): qty 0.25

## 2016-10-19 MED ORDER — DM-GUAIFENESIN ER 30-600 MG PO TB12: 1.0000 | ORAL_TABLET | Freq: Two times a day (BID) | ORAL | Status: DC

## 2016-10-19 MED ORDER — GUAIFENESIN ER 600 MG PO TB12
600.0000 mg | ORAL_TABLET | Freq: Two times a day (BID) | ORAL | Status: DC
  Administered 2016-10-19 – 2016-10-26 (×14): 600 mg via ORAL
  Filled 2016-10-19 (×14): qty 1

## 2016-10-19 NOTE — Progress Notes (Signed)
Physical Therapy Treatment Patient Details Name: Juan Roberson. MRN: [id number] DOB: 03/10/1971 Today's Date: 10/19/2016    History of Present Illness Patient is a 46 y/o male that presents s/p I&D of R knee with underlying pneumonia as well.     PT Comments    Patient is able to provide improved effort with trunk control though he continues to be highly limited by R knee pain with any dependence from gravity. He requires substantial assistance to complete transfers secondary to weakness/pain. Discussed discharge options including CIR with patient as he is not able to safely return home now without 24/7 assistance and lift equipment. He would benefit more from transfer training and gait training as his pain tolerance allows.   Follow Up Recommendations  CIR     Equipment Recommendations  Rolling walker with 5" wheels;3in1 (PT);Wheelchair (measurements PT) (Lift equipment if he plans to return home)    Recommendations for Other Services OT consult     Precautions / Restrictions Precautions Precautions: Fall Restrictions Weight Bearing Restrictions: Yes RLE Weight Bearing: Non weight bearing    Mobility  Bed Mobility Overal bed mobility: Needs Assistance Bed Mobility: Sit to Supine     Supine to sit: Mod assist;Max assist Sit to supine: Mod assist;Max assist;+2 for physical assistance   General bed mobility comments: Patient requires +2 assistance to complete sit to supine transfer secondary to pain/generalized weakness.   Transfers Overall transfer level: Needs assistance Equipment used: Rolling walker (2 wheeled) Transfers: Stand Pivot Transfers Sit to Stand: Max assist;+2 physical assistance (Unable to complete with max +2) Stand pivot transfers: Max assist;+2 physical assistance       General transfer comment: Performed bed to chair transfer with PT supporting RLE to avoid any WBing, patient continues to scream in pain from dependence of gravity on limb, he is  able to control his torso better for this transfer.   Ambulation/Gait             General Gait Details: Unable to achieve standing, only completed stand-pivot transfer.   Stairs            Wheelchair Mobility    Modified Rankin (Stroke Patients Only)       Balance Overall balance assessment: Needs assistance Sitting-balance support: Feet supported;Bilateral upper extremity supported Sitting balance-Leahy Scale: Fair Sitting balance - Comments: Initially patient leans laterally (to his L) and posteriorly and takes a few minutes to steady himself independently with his UEs  Postural control: Left lateral lean;Posterior lean   Standing balance-Leahy Scale: Zero                      Cognition Arousal/Alertness: Awake/alert Behavior During Therapy: WFL for tasks assessed/performed Overall Cognitive Status: Within Functional Limits for tasks assessed                      Exercises      General Comments        Pertinent Vitals/Pain Pain Assessment: Faces Faces Pain Scale: Hurts whole lot Pain Location: R knee  Pain Descriptors / Indicators: Aching;Sharp;Sore Pain Intervention(s): Limited activity within patient's tolerance;Monitored during session;Repositioned    Home Living Family/patient expects to be discharged to:: Private residence Living Arrangements: Children Available Help at Discharge: Family Type of Home: House Home Access: Level entry   Home Layout: One level Home Equipment: None      Prior Function Level of Independence: Independent      Comments: Able to complete  all ADLs, occupation, and driving.    PT Goals (current goals can now be found in the care plan section) Acute Rehab PT Goals Patient Stated Goal: To return home  PT Goal Formulation: With patient/family Time For Goal Achievement: 11/02/16 Potential to Achieve Goals: Fair Progress towards PT goals: Progressing toward goals    Frequency    BID       PT Plan Current plan remains appropriate    Co-evaluation             End of Session Equipment Utilized During Treatment: Gait belt Activity Tolerance: Patient tolerated treatment well;Patient limited by fatigue;Patient limited by pain Patient left: with family/visitor present;with call bell/phone within reach;in bed;with bed alarm set     Time: 7829-56211306-1320 PT Time Calculation (min) (ACUTE ONLY): 14 min  Charges:  $Therapeutic Activity: 8-22 mins                    G Codes:      Kerin RansomPatrick A Maiyah Goyne, PT, DPT    10/19/2016, 3:20 PM

## 2016-10-19 NOTE — Progress Notes (Signed)
Inpatient Diabetes Program Recommendations  AACE/ADA: New Consensus Statement on Inpatient Glycemic Control (2015)  Target Ranges:  Prepandial:   less than 140 mg/dL      Peak postprandial:   less than 180 mg/dL (1-2 hours)      Critically ill patients:  140 - 180 mg/dL   Lab Results  Component Value Date   GLUCAP 189 (H) 10/19/2016   HGBA1C 9.3 (H) 09/24/2016    Review of Glycemic Control  Results for [name], Areeb L JR. (MRN [id number]) as of 10/19/2016 11:54  Ref. Range 10/18/2016 11:35 10/18/2016 17:16 10/18/2016 22:20 10/19/2016 07:30 10/19/2016 11:32  Glucose-Capillary Latest Ref Range: 65 - 99 mg/dL 401276 (H) 027308 (H) 253193 (H) 217 (H) 189 (H)    Diabetes history: Type 2 Outpatient Diabetes medications: Toujeo 25 units qam Current orders for Inpatient glycemic control: Lantus 25 units qday, Novolog 0-9 units tid and hs  Inpatient Diabetes Program Recommendations: fasting blood sugar 217 mg/dl this am- consider increasing Lantus to 27 units this evening.  Since the patient is going to be NPO for surgery today- please change the Novolog correction to q4h.    Susette RacerJulie Zulema Pulaski, RN, BA, MHA, CDE Diabetes Coordinator Inpatient Diabetes Program  [phone] (Team Pager) [phone] [facility] Office) 10/19/2016 12:00 PM

## 2016-10-19 NOTE — Care Management (Signed)
This RNCM notified Russella DarBarb Boyette with CIR that PT was recommending CIR at discharge and that patient will need 6 weeks IV antibiotics. Lesle ReekBarb has not been notified and will review chart. CIR note to follow.

## 2016-10-19 NOTE — Consult Note (Signed)
Pharmacy Antibiotic Note  Juan BlossomWaltzie L Ezra Jr. is a 46 y.o. male admitted on 10/17/2016 with MRSA bacteremia, septic knee, possible PNA.  Pharmacy has been consulted for vancomycin and zosyn dosing.  Plan: Renal function, although below baseline has remained relatively stable. Will dose vancomycin 1g q 36 hours. Draw level at steady state. Earlier if significant decline in renal function occurs. Scr daily Zosyn 3.375g q 12hr due to unstable renal function and CrCl very close to <3120ml/min.   Height: 6\' 2"  (188 cm) Weight: 194 lb 4.8 oz (88.1 kg) IBW/kg (Calculated) : 82.2  Temp (24hrs), Avg:98.3 F (36.8 C), Min:97.7 F (36.5 C), Max:99.7 F (37.6 C)   Recent Labs Lab 10/15/16 1310 10/17/16 1250 10/17/16 1712 10/17/16 2045 10/18/16 0011 10/19/16 0453  WBC 11.3* 19.7*  --   --  12.6*  --   CREATININE 3.96* 4.98*  --   --  5.18* 5.59*  LATICACIDVEN  --   --  2.8* 2.2* 1.5  --     Estimated Creatinine Clearance: 19 mL/min (by C-G formula based on SCr of 5.59 mg/dL (H)).    Allergies  Allergen Reactions  . Sulfur Other (See Comments)    Pt states that this medication causes renal failure.      Antimicrobials this admission: vancomycin 1/16 >>  zosyn 1/16 >>   Dose adjustments this admission:   Microbiology results: 1/16 BCx: MRSA 2/4 so far 1/16 MRSA PCR: positive Joint fluid: few gram positive cocci in pair  Thank you for allowing pharmacy to be a part of this patient's care.  Olene FlossMelissa D Cesario Weidinger, Pharm.D, BCPS Clinical Pharmacist  10/19/2016 11:15 AM

## 2016-10-19 NOTE — Evaluation (Signed)
Physical Therapy Evaluation Patient Details Name: Juan Roberson. MRN: [id number] DOB: April 28, 1971 Today's Date: 10/19/2016   History of Present Illness  Patient is a 46 y/o male that presents s/p I&D of R knee with underlying pneumonia as well.   Clinical Impression  Patient recently came down with respiratory illness, followed by twisting injury to his R knee. He was admitted with septic R knee, underwent I&D and is now NWB on RLE. He was largely independent prior to this injury, but in this session is now so deconditioned it takes 2-3 minutes for him to even maintain a sitting position independently. He is unable to complete sit to stand or scooting transfers even with +2 max assist secondary to deconditioning/weakness/LE pain. Patient will likely need lift equipment until he becomes stronger/more proficient with transfers. At this time given his age, motivation levels, and family support he seems to be an excellent candidate for CIR, given his stark change in mobility levels.    Follow Up Recommendations CIR    Equipment Recommendations  Rolling walker with 5" wheels;3in1 (PT);Wheelchair (measurements PT) -- Lift equipment would be more appropriate.    Recommendations for Other Services OT consult     Precautions / Restrictions Precautions Precautions: Fall Restrictions Weight Bearing Restrictions: Yes RLE Weight Bearing: Non weight bearing      Mobility  Bed Mobility Overal bed mobility: Needs Assistance Bed Mobility: Supine to Sit     Supine to sit: Mod assist;Max assist     General bed mobility comments: Patient is able to bring LLE to EOB, however RLE takes more effort on his end, he is quite weak and requires PT to stabilize his torso initially secondary to weakness.   Transfers Overall transfer level: Needs assistance Equipment used: Rolling walker (2 wheeled) Transfers: Stand Pivot Transfers;Sit to/from Stand Sit to Stand: Max assist;+2 physical assistance  (Unable to complete with max +2) Stand pivot transfers: Max assist;+2 physical assistance       General transfer comment: Multiple attempts at both scooting transfers, sit to stand with RW, ultimately opted for stand pivot transfer though patient reports he did have minimal amount of weight go through RLE, pain reduced after a few seconds.   Ambulation/Gait             General Gait Details: Unable to achieve standing, only completed stand-pivot transfer.  Stairs            Wheelchair Mobility    Modified Rankin (Stroke Patients Only)       Balance Overall balance assessment: Needs assistance Sitting-balance support: Feet supported;Bilateral upper extremity supported Sitting balance-Leahy Scale: Poor Sitting balance - Comments: Initially patient leans laterally (to his L) and posteriorly and takes a few minutes to steady himself independently with his UEs  Postural control: Left lateral lean;Posterior lean   Standing balance-Leahy Scale: Zero                               Pertinent Vitals/Pain Pain Assessment: Faces Faces Pain Scale: Hurts even more Pain Location: R knee  Pain Descriptors / Indicators: Aching;Sharp;Sore Pain Intervention(s): Limited activity within patient's tolerance;Monitored during session;Repositioned    Home Living Family/patient expects to be discharged to:: Private residence Living Arrangements: Children Available Help at Discharge: Family Type of Home: House Home Access: Level entry     Home Layout: One level Home Equipment: None      Prior Function Level of Independence: Independent  Comments: Able to complete all ADLs, occupation, and driving.      Hand Dominance        Extremity/Trunk Assessment   Upper Extremity Assessment Upper Extremity Assessment: Generalized weakness    Lower Extremity Assessment Lower Extremity Assessment: RLE deficits/detail RLE: Unable to fully assess due to pain        Communication   Communication: No difficulties  Cognition Arousal/Alertness: Awake/alert Behavior During Therapy: WFL for tasks assessed/performed Overall Cognitive Status: Within Functional Limits for tasks assessed                      General Comments      Exercises     Assessment/Plan    PT Assessment Patient needs continued PT services  PT Problem List Decreased strength;Decreased range of motion;Pain;Decreased balance;Decreased knowledge of use of DME;Decreased activity tolerance;Decreased knowledge of precautions;Decreased mobility          PT Treatment Interventions DME instruction;Gait training;Stair training;Balance training;Therapeutic exercise;Therapeutic activities;Patient/family education;Neuromuscular re-education;Functional mobility training;Wheelchair mobility training    PT Goals (Current goals can be found in the Care Plan section)  Acute Rehab PT Goals Patient Stated Goal: To return home  PT Goal Formulation: With patient/family Time For Goal Achievement: 11/02/16 Potential to Achieve Goals: Fair    Frequency BID   Barriers to discharge Inaccessible home environment Patient right now would need lift equipment to safely manage at home.     Co-evaluation               End of Session Equipment Utilized During Treatment: Gait belt Activity Tolerance: Patient tolerated treatment well;Patient limited by fatigue;Patient limited by pain Patient left: in chair;with chair alarm set;with family/visitor present;with call bell/phone within reach Nurse Communication: Mobility status         Time: [id number] PT Time Calculation (min) (ACUTE ONLY): 22 min   Charges:   PT Evaluation $PT Eval High Complexity: 1 Procedure     PT G Codes:       Kerin RansomPatrick A Gwendolyn Mclees, PT, DPT    10/19/2016, 1:31 PM

## 2016-10-19 NOTE — Clinical Social Work Note (Signed)
Clinical Social Work Assessment  Patient Details  Name: Juan Roberson. MRN: [id number] Date of Birth: 1971-02-09  Date of referral:  10/19/16               Reason for consult:  Facility Placement                Permission sought to share information with:  Chartered certified accountant granted to share information::  Yes, Verbal Permission Granted  Name::      [place]::   [place]   Relationship::     Contact Information:     Housing/Transportation Living arrangements for the past 2 months:  [place] of Information:  Patient, Other (Comment Required) (girlfriend Education officer, community. ) Patient Interpreter Needed:  None Criminal Activity/Legal Involvement Pertinent to Current Situation/Hospitalization:  No - Comment as needed Significant Relationships:  Other Family Members, Significant Other, Siblings, Adult Children Lives with:  Adult Children Do you feel safe going back to the place where you live?  Yes Need for family participation in patient care:  Yes (Comment)  Care giving concerns:  Patient lives in [place] with his 2 daughters.    Social Worker assessment / plan:  Holiday representative ([place]) reviewed chart and noted that PT is recommending CIR. Per CIR admissions note they will likely not accept patient because he can't do 3 hours of therapy per day. CSW met with patient and his girlfriend Asencion Partridge are at bedside. CSW introduced self and explained role of CSW department. Patient reported that he lives in [place] with his 2 daughters. Asencion Partridge reported that she is patient's girlfriend and primary support. CSW explained that CIR will likely not take patient. CSW explained SNF option for rehab and IV Abx. Patient is agreeable to SNF search in [facility]. CSW explained that SNF choices are going to be limited because of patient having the flu. Patient verbalized his understanding. FL2 complete and faxed out. CSW will  continue to follow and assist as needed.   Employment status:  Retired Advertising copywriter PT Recommendations:  Inpatient [place] / Referral to community resources:  [place]  Patient/Family's Response to care:  Patient is agreeable to SNF search in [place].   Patient/Family's Understanding of and Emotional Response to Diagnosis, Current Treatment, and Prognosis:  Patient and Asencion Partridge were very pleasant and thanked CSW for visit.   Emotional Assessment Appearance:  Appears stated age Attitude/Demeanor/Rapport:  Lethargic Affect (typically observed):  Accepting, Adaptable, Pleasant Orientation:  Oriented to Self, Oriented to Place, Oriented to  Time, Oriented to Situation Alcohol / Substance use:  Not Applicable Psych involvement (Current and /or in the community):  No (Comment)  Discharge Needs  Concerns to be addressed:  Discharge Planning Concerns Readmission within the last 30 days:  No Current discharge risk:  Dependent with Mobility, Chronically ill Barriers to Discharge:  Continued Medical Work up   [employer], Veronia Beets, LCSW 10/19/2016, 4:36 PM

## 2016-10-19 NOTE — Progress Notes (Signed)
Subjective: 1 Day Post-Op Procedure(s) (LRB): IRRIGATION AND DEBRIDEMENT KNEE (Right) ARTHROSCOPY KNEE    Patient reports pain as moderate.  Wbc down today.  Knee still painful as expected with MRSA.   Objective:   VITALS:   Vitals:   10/19/16 0941 10/19/16 1514  BP: 130/69 100/62  Pulse: 99 85  Resp: 18 18  Temp: 97.8 F (36.6 C) 97.7 F (36.5 C)    Neurologically intact ABD soft Neurovascular intact Sensation intact distally Intact pulses distally Dorsiflexion/Plantar flexion intact Incision: no drainage  LABS  Recent Labs  10/17/16 1250 10/18/16 0011  HGB 10.5* 8.4*  HCT 32.4* 26.4*  WBC 19.7* 12.6*  PLT 151 93*     Recent Labs  10/17/16 1250 10/18/16 0011 10/19/16 0453  NA 125* 127* 130*  K 5.0 4.7 5.1  BUN 94* 97* 107*  CREATININE 4.98* 5.18* 5.59*  GLUCOSE 410* 267* 197*     Recent Labs  10/17/16 2036 10/18/16 0011  INR 2.05 2.18     Assessment/Plan: 1 Day Post-Op Procedure(s) (LRB): IRRIGATION AND DEBRIDEMENT KNEE (Right) ARTHROSCOPY KNEE

## 2016-10-19 NOTE — Progress Notes (Signed)
Rehab Admissions Coordinator Note:  Patient was screened by Clois DupesBoyette, Gennett Garcia Godwin for appropriateness for an Inpatient Acute Rehab Consult per PT recommendation.  At this time, we are recommending await further progress with therapies before determining rehab venue. Pt currently not at a level to tolerate 3 hrs per day of therapy based on today's evaluation. Recommend OT eval. Noted for 6 weeks if IV antibiotics. If pt slow to progress with therapies and needs a prolonged stay for the antibiotics, SNF rehab would be recommended. Our average LOS is less than two weeks. I will follow.  Clois DupesBoyette, Ollin Hochmuth Godwin 10/19/2016, 1:42 PM  I can be reached at [phone].

## 2016-10-19 NOTE — Progress Notes (Addendum)
Infectious Disease Long Term IV Antibiotic Orders  Diagnosis: MRSA bacteremia and septic R knee  Culture results MRSA   Allergies:  Allergies  Allergen Reactions  . Sulfur Other (See Comments)    Pt states that this medication causes renal failure.      Discharge antibiotics Vancomycin -   1000  mg  every  HD session Goal vancomycin trough 15-20.    Pharmacy to adjust dosing based on levels  PICC Care per protocol Labs weekly while on IV antibiotics      CBC w diff   Comprehensive met panel Vancomycin Trough    Planned duration of antibiotics 6 WEEKS   Stop date 11/30/15  Follow up clinic date Within 1-2 weeks  FAX weekly labs to [phone]  Leonel Ramsay, MD

## 2016-10-19 NOTE — [place] FL2 (Signed)
[place] MEDICAID FL2 LEVEL OF CARE SCREENING TOOL     IDENTIFICATION  Patient Name: Juan Roberson. Birthdate: [birth year] Sex: male Admission Date (Current Location): 10/17/2016  [place] and [place] Number:  Chiropodist and Address:  [facility], [address], [place], [place] [address]      Provider Number: [id number]  Attending Physician Name and Address:  Delfino Lovett, MD  Relative Name and Phone Number:       Current Level of Care: Hospital Recommended Level of Care: Skilled Nursing Facility Prior Approval Number:    Date Approved/Denied:   PASRR Number:  [id number])  Discharge Plan: SNF    Current Diagnoses: Patient Active Problem List   Diagnosis Date Noted  . Sepsis (HCC) 10/17/2016  . Chest pain 09/24/2016  . Acute-on-chronic kidney injury (HCC) 09/24/2016    Orientation RESPIRATION BLADDER Height & Weight     Self, Time, Situation, Place  Normal Continent Weight: 194 lb 4.8 oz (88.1 kg) Height:  6\' 2"  (188 cm)  BEHAVIORAL SYMPTOMS/MOOD NEUROLOGICAL BOWEL NUTRITION STATUS   (none)  (none) Continent Diet (Regular Diet )  AMBULATORY STATUS COMMUNICATION OF NEEDS Skin   Extensive Assist Verbally Surgical wounds (Incision: Right Knee )                       Personal Care Assistance Level of Assistance  Bathing, Feeding, Dressing Bathing Assistance: Limited assistance Feeding assistance: Independent Dressing Assistance: Limited assistance     Functional Limitations Info  Sight, Hearing, Speech Sight Info: Adequate Hearing Info: Adequate Speech Info: Adequate    SPECIAL CARE FACTORS FREQUENCY  PT (By licensed PT), OT (By licensed OT) (Patient needs 6 weeks of IV Vanc. )     PT Frequency:  (5) OT Frequency:  (5)            Contractures      Additional Factors Info  Code Status, Allergies, Insulin Sliding Scale, Isolation Precautions Code Status Info:  (Full Code. ) Allergies Info:  (Sulfur )   Insulin Sliding Scale Info:  (NovoLog Insulin Injections. ) Isolation Precautions Info:  (Flu and MRSA)     Current Medications (10/19/2016):  This is the current hospital active medication list Current Facility-Administered Medications  Medication Dose Route Frequency Provider Last Rate Last Dose  . 0.45 % sodium chloride infusion   Intravenous Continuous Deeann Saint, MD 75 mL/hr at 10/18/16 2338    . acetaminophen (TYLENOL) tablet 650 mg  650 mg Oral Q6H PRN Deeann Saint, MD       Or  . acetaminophen (TYLENOL) suppository 650 mg  650 mg Rectal Q6H PRN Deeann Saint, MD      . amiodarone (PACERONE) tablet 200 mg  200 mg Oral Daily Vipul Shah, MD      . aspirin tablet 325 mg  325 mg Oral Daily Vipul Shah, MD      . bisacodyl (DULCOLAX) suppository 10 mg  10 mg Rectal Daily PRN Deeann Saint, MD      . Chlorhexidine Gluconate Cloth 2 % PADS 6 each  6 each Topical Q0600 Delfino Lovett, MD   6 each at 10/19/16 0600  . cloNIDine (CATAPRES - Dosed in mg/24 hr) patch 0.3 mg  0.3 mg Transdermal Weekly Delfino Lovett, MD      . guaiFENesin (MUCINEX) 12 hr tablet 600 mg  600 mg Oral BID Delfino Lovett, MD   600 mg at 10/19/16 1238   And  . dextromethorphan (  DELSYM) 30 MG/5ML liquid 30 mg  30 mg Oral BID Delfino LovettVipul Shah, MD      . HYDROcodone-acetaminophen (NORCO) 7.5-325 MG per tablet 1-2 tablet  1-2 tablet Oral Q4H PRN Deeann SaintHoward Miller, MD   2 tablet at 10/19/16 1339  . insulin aspart (novoLOG) injection 0-9 Units  0-9 Units Subcutaneous TID PC & HS Delfino LovettVipul Shah, MD   2 Units at 10/19/16 1238  . insulin glargine (LANTUS) injection 25 Units  25 Units Subcutaneous Daily Delfino LovettVipul Shah, MD   25 Units at 10/19/16 [phone]  . magnesium hydroxide (MILK OF MAGNESIA) suspension 30 mL  30 mL Oral Daily PRN Deeann SaintHoward Miller, MD      . methimazole (TAPAZOLE) tablet 10 mg  10 mg Oral Daily Vipul Sherryll BurgerShah, MD      . methocarbamol (ROBAXIN) tablet 500 mg  500 mg Oral Q6H PRN Deeann SaintHoward Miller, MD   500 mg at 10/19/16 0303   Or  . methocarbamol  (ROBAXIN) 500 mg in dextrose 5 % 50 mL IVPB  500 mg Intravenous Q6H PRN Deeann SaintHoward Miller, MD      . metoCLOPramide (REGLAN) tablet 5-10 mg  5-10 mg Oral Q8H PRN Deeann SaintHoward Miller, MD       Or  . metoCLOPramide (REGLAN) injection 5-10 mg  5-10 mg Intravenous Q8H PRN Deeann SaintHoward Miller, MD      . metoprolol (LOPRESSOR) tablet 50 mg  50 mg Oral Daily Deeann SaintHoward Miller, MD   50 mg at 10/19/16 0939  . morphine 4 MG/ML injection 2 mg  2 mg Intravenous Q2H PRN Deeann SaintHoward Miller, MD      . mupirocin ointment (BACTROBAN) 2 % 1 application  1 application Nasal BID Delfino LovettVipul Shah, MD   1 application at 10/19/16 1239  . ondansetron (ZOFRAN) tablet 4 mg  4 mg Oral Q6H PRN Deeann SaintHoward Miller, MD       Or  . ondansetron [facility](ZOFRAN) injection 4 mg  4 mg Intravenous Q6H PRN Deeann SaintHoward Miller, MD      . pantoprazole (PROTONIX) EC tablet 40 mg  40 mg Oral Daily Vipul Shah, MD      . senna (SENOKOT) tablet 8.6 mg  1 tablet Oral BID Deeann SaintHoward Miller, MD   8.6 mg at 10/19/16 0939  . sodium phosphate (FLEET) 7-19 GM/118ML enema 1 enema  1 enema Rectal Once PRN Deeann SaintHoward Miller, MD      . Melene Muller[START ON 10/20/2016] vancomycin (VANCOCIN) IVPB 1000 mg/200 mL premix  1,000 mg Intravenous Q36H Olene FlossMelissa D Maccia, RPH         Discharge Medications: Please see discharge summary for a list of discharge medications.  Relevant Imaging Results:  Relevant Lab Results:   Additional Information  (SSN: [id number])  Dawnielle Christiana, Darleen CrockerBailey M, LCSW

## 2016-10-19 NOTE — Progress Notes (Signed)
[employer] -  at [facility]   PATIENT NAME: Juan Roberson    MR#:  [id number]  DATE OF BIRTH:  02-05-1971  SUBJECTIVE:  CHIEF COMPLAINT:   Chief Complaint  Patient presents with  . Influenza  . Abdominal Pain  . Knee Pain  still having lots of knee pain and requesting pain meds, unable to bear weight still REVIEW OF SYSTEMS:  Review of Systems  Constitutional: Positive for chills and fever. Negative for weight loss.  HENT: Negative for nosebleeds and sore throat.   Eyes: Negative for blurred vision.  Respiratory: Negative for cough, shortness of breath and wheezing.   Cardiovascular: Negative for chest pain, orthopnea, leg swelling and PND.  Gastrointestinal: Negative for abdominal pain, constipation, diarrhea, heartburn, nausea and vomiting.  Genitourinary: Negative for dysuria and urgency.  Musculoskeletal: Positive for joint pain. Negative for back pain.  Skin: Negative for rash.  Neurological: Negative for dizziness, speech change, focal weakness and headaches.  Endo/Heme/Allergies: Does not bruise/bleed easily.  Psychiatric/Behavioral: Negative for depression.    DRUG ALLERGIES:   Allergies  Allergen Reactions  . Sulfur Other (See Comments)    Pt states that this medication causes renal failure.     VITALS:  Blood pressure 130/69, pulse 99, temperature 97.8 F (36.6 C), temperature source Oral, resp. rate 18, height 6\' 2"  (1.88 m), weight 88.1 kg (194 lb 4.8 oz), SpO2 97 %. PHYSICAL EXAMINATION:  Physical Exam  Constitutional: He is oriented to person, place, and time and well-developed, well-nourished, and in no distress.  HENT:  Head: Normocephalic and atraumatic.  Eyes: Conjunctivae and EOM are normal. Pupils are equal, round, and reactive to light.  Neck: Normal range of motion. Neck supple. No tracheal deviation present. No thyromegaly present.  Cardiovascular: Normal rate, regular rhythm and normal heart sounds.   Pulmonary/Chest:  Effort normal and breath sounds normal. No respiratory distress. He has no wheezes. He exhibits no tenderness.  Abdominal: Soft. Bowel sounds are normal. He exhibits no distension. There is no tenderness.  Musculoskeletal:       Right knee: He exhibits decreased range of motion and effusion. Tenderness found.  Neurological: He is alert and oriented to person, place, and time. No cranial nerve deficit.  Skin: Skin is warm and dry. No rash noted.  Psychiatric: Mood and affect normal.   LABORATORY PANEL:   CBC  Recent Labs Lab 10/18/16 0011  WBC 12.6*  HGB 8.4*  HCT 26.4*  PLT 93*   ------------------------------------------------------------------------------------------------------------------ Chemistries   Recent Labs Lab 10/17/16 1250  10/19/16 0453  NA 125*  < > 130*  K 5.0  < > 5.1  CL 93*  < > 101  CO2 18*  < > 17*  GLUCOSE 410*  < > 197*  BUN 94*  < > 107*  CREATININE 4.98*  < > 5.59*  CALCIUM 8.9  < > 8.4*  AST 61*  --   --   ALT 22  --   --   ALKPHOS 109  --   --   BILITOT 2.3*  --   --   < > = values in this interval not displayed. RADIOLOGY:  No results found. ASSESSMENT AND PLAN:  Patient is a 46 year old white male recently diagnosed with the flu now presents with sepsis  * MRSA Bacteremia, Sepsis: present on admission. Due to septic joint - continue IV vancomycin - await fluid c/s  * Septic Rt knee - Irrigation and debridement s/p arthoscopy on 1/17  *  Hyponatremia: 125-> 130 - improving with IVFs and monitor  * Influenza: On Tamiflu - so far only took 1 dose  * Pneumonia: ruled out  * ESRD - Nephro following for HD  * Diarrhea Resolved, could be viral  * Diabetes type 2 - continue sliding scale insulin - continue lantus and monitor  * History of DVT: no longer taking coumadin (his PCP asked him to stop)     All the records are reviewed and case discussed with Care Management/Social Worker. Management plans discussed with  the patient, family (girlfriend at bedside), Nursing and they are in agreement.  CODE STATUS: FULL CODE  TOTAL TIME TAKING CARE OF THIS PATIENT: 35 minutes.   More than 50% of the time was spent in counseling/coordination of care: YES  POSSIBLE D/C IN 1-2 DAYS, DEPENDING ON CLINICAL CONDITION.   Juan LovettVipul Modell Roberson M.D on 10/19/2016 at 1:03 PM  Between 7am to 6pm - Pager - [phone]  After 6pm go to [email or web address] - Social research officer, governmentpassword EPAS [email or web address]  [facility] [place] [facility]  Office  [phone]  CC: Primary care physician; Corky DownsMASOUD,JAVED, MD  Note: This dictation was prepared with Dragon dictation along with smaller phrase technology. Any transcriptional errors that result from this process are unintentional.

## 2016-10-19 NOTE — Progress Notes (Signed)
ID E note MRSA bacteremia in patient with DM, CKD and recent DX of flu. He also has a R knee swelling following twisitng it.   On admit wbc 19, temp nml. UA 6-30 wbc , CXR with small L effusion and possible infiltrate vs atelectasis Synovial fluid 22K wbc 98% PMNS, GS + GPC in chains and cx pending BCX + MRSA S/p R knee washout 1/17 with findings of septic knee Repeat bcx neg 1/17  ESR 63, CRP 27. HIV NEG.  ECHO showed dilated cardiomyopathy but no vegetations Recommendations For the MRSA bacteremia and  septic knee he will need 6 weeks IV vanco so unless he is hemodynamically unstable or concern for valve ring abscess he would not need TEE. Once bcx done 1/17 are neg x 48 hours will need PICC - given CKD will need neck IJ picc Will follow - see abx order sheet for details of IV abx order

## 2016-10-19 NOTE — Clinical Social Work Placement (Signed)
   CLINICAL SOCIAL WORK PLACEMENT  NOTE  Date:  10/19/2016  Patient Details  Name: Juan BlossomWaltzie L Spradlin Jr. MRN: [id number] Date of Birth: 07/06/1971  Clinical Social Work is seeking post-discharge placement for this patient at the Skilled  Nursing Facility level of care (*CSW will initial, date and re-position this form in  chart as items are completed):  Yes   Patient/family provided with [place] Clinical Social Work Department's list of facilities offering this level of care within the geographic area requested by the patient (or if unable, by the patient's family).  Yes   Patient/family informed of their freedom to choose among providers that offer the needed level of care, that participate in Medicare, Medicaid or managed care program needed by the patient, have an available bed and are willing to accept the patient.  Yes   Patient/family informed of [facility]'s ownership interest in [facility] and [facility], as well as of the fact that they are under no obligation to receive care at these facilities.  PASRR submitted to EDS on 10/19/16     PASRR number received on 10/19/16     Existing PASRR number confirmed on       FL2 transmitted to all facilities in geographic area requested by pt/family on 10/19/16     FL2 transmitted to all facilities within larger geographic area on       Patient informed that his/her managed care company has contracts with or will negotiate with certain facilities, including the following:            Patient/family informed of bed offers received.  Patient chooses bed at       Physician recommends and patient chooses bed at      Patient to be transferred to   on  .  Patient to be transferred to facility by       Patient family notified on   of transfer.  Name of family member notified:        PHYSICIAN       Additional Comment:    _______________________________________________ Mailey Landstrom, Darleen CrockerBailey M, LCSW 10/19/2016, 4:34 PM

## 2016-10-20 DIAGNOSIS — I10 Essential (primary) hypertension: Secondary | ICD-10-CM

## 2016-10-20 DIAGNOSIS — E119 Type 2 diabetes mellitus without complications: Secondary | ICD-10-CM

## 2016-10-20 DIAGNOSIS — N183 Chronic kidney disease, stage 3 (moderate): Secondary | ICD-10-CM

## 2016-10-20 LAB — BODY FLUID CULTURE

## 2016-10-20 LAB — GLUCOSE, CAPILLARY
GLUCOSE-CAPILLARY: 134 mg/dL — AB (ref 65–99)
GLUCOSE-CAPILLARY: 132 mg/dL — AB (ref 65–99)
Glucose-Capillary: 168 mg/dL — ABNORMAL HIGH (ref 65–99)
Glucose-Capillary: 129 mg/dL — ABNORMAL HIGH (ref 65–99)

## 2016-10-20 LAB — BASIC METABOLIC PANEL
Anion gap: 11 (ref 5–15)
BUN: 131 mg/dL — AB (ref 6–20)
CHLORIDE: 97 mmol/L — AB (ref 101–111)
CO2: 17 mmol/L — ABNORMAL LOW (ref 22–32)
Calcium: 8.3 mg/dL — ABNORMAL LOW (ref 8.9–10.3)
Creatinine, Ser: 6.78 mg/dL — ABNORMAL HIGH (ref 0.61–1.24)
GFR calc Af Amer: 10 mL/min — ABNORMAL LOW (ref >60)
GFR calc non Af Amer: 9 mL/min — ABNORMAL LOW (ref >60)
GLUCOSE: 169 mg/dL — AB (ref 65–99)
POTASSIUM: 5.2 mmol/L — AB (ref 3.5–5.1)
Sodium: 125 mmol/L — ABNORMAL LOW (ref 135–145)

## 2016-10-20 LAB — CBC
HEMATOCRIT: 25.7 % — AB (ref 40.0–52.0)
HEMOGLOBIN: 8.3 g/dL — AB (ref 13.0–18.0)
MCH: 26.7 pg (ref 26.0–34.0)
MCHC: 32.4 g/dL (ref 32.0–36.0)
MCV: 82.3 fL (ref 80.0–100.0)
Platelets: 74 10*3/uL — ABNORMAL LOW (ref 150–440)
RBC: 3.13 MIL/uL — AB (ref 4.40–5.90)
RDW: 16.1 % — ABNORMAL HIGH (ref 11.5–14.5)
WBC: 9.3 10*3/uL (ref 3.8–10.6)

## 2016-10-20 LAB — CULTURE, BLOOD (ROUTINE X 2)

## 2016-10-20 MED ORDER — VANCOMYCIN HCL IN DEXTROSE 1-5 GM/200ML-% IV SOLN
1000.0000 mg | INTRAVENOUS | Status: DC | PRN
  Filled 2016-10-20: qty 200

## 2016-10-20 MED ORDER — HEPARIN SODIUM (PORCINE) 5000 UNIT/ML IJ SOLN
5000.0000 [IU] | Freq: Three times a day (TID) | INTRAMUSCULAR | Status: DC
  Administered 2016-10-20 – 2016-10-26 (×14): 5000 [IU] via SUBCUTANEOUS
  Filled 2016-10-20 (×14): qty 1

## 2016-10-20 MED ORDER — VANCOMYCIN HCL 10 G IV SOLR: 1250.0000 mg | INTRAVENOUS | Status: DC

## 2016-10-20 NOTE — Progress Notes (Signed)
[employer] - [place] at [facility]   PATIENT NAME: Juan Roberson    MR#:  [id number]  DATE OF BIRTH:  1971-06-18  SUBJECTIVE:  CHIEF COMPLAINT:   Chief Complaint  Patient presents with  . Influenza  . Abdominal Pain  . Knee Pain  still having lots of knee pain and requesting pain meds, unable to bear weight still. BP is running low, and have gradually worsening renal function. REVIEW OF SYSTEMS:  Review of Systems  Constitutional: Negative for chills, fever and weight loss.  HENT: Negative for nosebleeds and sore throat.   Eyes: Negative for blurred vision.  Respiratory: Negative for cough, shortness of breath and wheezing.   Cardiovascular: Negative for chest pain, orthopnea, leg swelling and PND.  Gastrointestinal: Negative for abdominal pain, constipation, diarrhea, heartburn, nausea and vomiting.  Genitourinary: Negative for dysuria and urgency.  Musculoskeletal: Positive for joint pain. Negative for back pain.  Skin: Negative for rash.  Neurological: Negative for dizziness, speech change, focal weakness and headaches.  Endo/Heme/Allergies: Does not bruise/bleed easily.  Psychiatric/Behavioral: Negative for depression.    DRUG ALLERGIES:   Allergies  Allergen Reactions  . Sulfur Other (See Comments)    Pt states that this medication causes renal failure.     VITALS:  Blood pressure (!) 104/52, pulse 98, temperature 97.9 F (36.6 C), temperature source Oral, resp. rate 20, height 6\' 2"  (1.88 m), weight 88.1 kg (194 lb 4.8 oz), SpO2 91 %. PHYSICAL EXAMINATION:  Physical Exam  Constitutional: He is oriented to person, place, and time and well-developed, well-nourished, and in no distress.  HENT:  Head: Normocephalic and atraumatic.  Eyes: Conjunctivae and EOM are normal. Pupils are equal, round, and reactive to light.  Neck: Normal range of motion. Neck supple. No tracheal deviation present. No thyromegaly present.  Cardiovascular: Normal rate,  regular rhythm and normal heart sounds.   Pulmonary/Chest: Effort normal and breath sounds normal. No respiratory distress. He has no wheezes. He exhibits no tenderness.  Abdominal: Soft. Bowel sounds are normal. He exhibits no distension. There is no tenderness.  Musculoskeletal:       Right knee: He exhibits decreased range of motion and effusion. Tenderness found.  Neurological: He is alert and oriented to person, place, and time. No cranial nerve deficit.  Skin: Skin is warm and dry. No rash noted.  Psychiatric: Mood and affect normal.   LABORATORY PANEL:   CBC  Recent Labs Lab 10/20/16 0805  WBC 9.3  HGB 8.3*  HCT 25.7*  PLT 74*   ------------------------------------------------------------------------------------------------------------------ Chemistries   Recent Labs Lab 10/17/16 1250  10/20/16 0805  NA 125*  < > 125*  K 5.0  < > 5.2*  CL 93*  < > 97*  CO2 18*  < > 17*  GLUCOSE 410*  < > 169*  BUN 94*  < > 131*  CREATININE 4.98*  < > 6.78*  CALCIUM 8.9  < > 8.3*  AST 61*  --   --   ALT 22  --   --   ALKPHOS 109  --   --   BILITOT 2.3*  --   --   < > = values in this interval not displayed. RADIOLOGY:  No results found. ASSESSMENT AND PLAN:  Patient is a 46 year old white male recently diagnosed with the flu now presents with sepsis  * MRSA Bacteremia, Sepsis: present on admission. Due to septic joint - continue IV vancomycin - await fluid c/s  Now with renal failure and  need for HD, we may need to wait for final outcome and adjust Abx accordingly.  * Septic Rt knee - Irrigation and debridement s/p arthoscopy on 1/17  * Hyponatremia: 125-> 130 - improving with IVFs and monitor  * Influenza: On Tamiflu   *Pneumonia: ruled out  * CKD stage 3- likely progressed to ESRD - Nephro consult called , need to start on HD today.  * Diarrhea Resolved, could be viral  * Diabetes type 2 - continue sliding scale insulin - continue lantus and  monitor  * History of DVT: no longer taking coumadin (his PCP asked him to stop)     All the records are reviewed and case discussed with Care Management/Social Worker. Management plans discussed with the patient, family (girlfriend at bedside), Nursing and they are in agreement.  CODE STATUS: FULL CODE  TOTAL TIME TAKING CARE OF THIS PATIENT: 35 minutes.   More than 50% of the time was spent in counseling/coordination of care: YES  POSSIBLE D/C IN 1-2 DAYS, DEPENDING ON CLINICAL CONDITION.   Altamese DillingVACHHANI, Ozan Maclay M.D on 10/20/2016 at 8:15 PM  Between 7am to 6pm - Pager - [phone]  After 6pm go to [email or web address] - Social research officer, governmentpassword EPAS [facility]  [facility] [place] [facility]  Office  [phone]  CC: Primary care physician; Corky DownsMASOUD,JAVED, MD  Note: This dictation was prepared with Dragon dictation along with smaller phrase technology. Any transcriptional errors that result from this process are unintentional.

## 2016-10-20 NOTE — Op Note (Signed)
  OPERATIVE NOTE   PROCEDURE: 1. Insertion of temporary dialysis catheter catheter left femoral approach.  PRE-OPERATIVE DIAGNOSIS: Acute on chronic renal failure, sepsis  POST-OPERATIVE DIAGNOSIS: Same  SURGEON: Renford DillsGregory G Schnier M.D.  ANESTHESIA: 1% lidocaine local infiltration  ESTIMATED BLOOD LOSS: Minimal cc  INDICATIONS:   [name] is a 46 y.o. male who presents with sepsis secondary to several infectious etiologies. He has known chronic renal insufficiency but now has advanced acutely and is in end-stage renal failure with severe uremia.  DESCRIPTION: After obtaining full informed written consent, the patient was positioned supine. The left groin was prepped and draped in a sterile fashion. Ultrasound was placed in a sterile sleeve. Ultrasound was utilized to identify the left femoral vein which is noted to be echolucent and compressible indicating patency. Images recorded for the permanent record. Under real-time visualization a Seldinger needle is inserted into the vein and the guidewires advanced without difficulty. Small counterincision was made at the wire insertion site. Dilator is passed over the wire and the temporary dialysis catheter catheter is fed over the wire without difficulty.  All lumens aspirate and flush easily and are packed with heparin saline. Catheter secured to the skin of the left thigh with 2-0 silk. A sterile dressing is applied with Biopatch.  COMPLICATIONS: None  CONDITION: Unchanged  Levora DredgeGregory Schnier Office:  [phone] 10/20/2016, 9:25 PM

## 2016-10-20 NOTE — Progress Notes (Signed)
Physical Therapy Treatment Patient Details Name: Juan BlossomWaltzie L Golson Jr. MRN: [id number] DOB: 08/31/1971 Today's Date: 10/20/2016    History of Present Illness Patient is a 46 y/o male that presents s/p I&D of R knee with underlying pneumonia as well.     PT Comments    Pt stated R knee was sore and he felt weak but willing to participate in PT treatment. Displayed decrease in strength, during supine therex, in bilat LE's more effected on R side (grossly at least 2+/5). Pt able to sit at EOB with min assist to guide R LE and to obtain upright trunk. He was able to scoot transfer to chair from bed with min assist to guide R LE and verbal cues for proper hand placements. Pt able to transfer to chair with multiple scoots using bilat UE for lifting off. Pt displayed improvements in overall strength and bed mobility from previous treatments. He still is far from baseline and displays strength, ROM, and mobility deficits that prevent safe household mobility. He will continue to benefit from skilled PT and is recommended he discharge to a STR following acute care to improve deficits listed above.   Follow Up Recommendations  SNF     Equipment Recommendations  Rolling walker with 5" wheels    Recommendations for Other Services       Precautions / Restrictions Precautions Precautions: Fall Restrictions Weight Bearing Restrictions: Yes RLE Weight Bearing: NWB    Mobility  Bed Mobility Overal bed mobility: Independent Bed Mobility: Supine to Sit     Supine to sit: Min assist     General bed mobility comments: min assit needed to guide R LE to edge of bed and pull up to sitting   Transfers Overall transfer level: Needs assistance Equipment used: None Transfers: Lateral/Scoot Transfers          Lateral/Scoot Transfers: Min assist General transfer comment: required assistance guiding R knee, pain increased with mobility, able to use bilat UE to lift body and scoot short distances from  bed to chair, required frequent verbal cues   Ambulation/Gait             General Gait Details: did not attempt    Stairs            Wheelchair Mobility    Modified Rankin (Stroke Patients Only)       Balance Overall balance assessment: Needs assistance Sitting-balance support: Feet supported;Bilateral upper extremity supported Sitting balance-Leahy Scale: Fair Sitting balance - Comments: required min guarding to maintain upright posture, stated he felt weak  Postural control: Posterior lean     Standing balance comment: did not attempt                     Cognition Arousal/Alertness: Awake/alert Behavior During Therapy: WFL for tasks assessed/performed Overall Cognitive Status: Within Functional Limits for tasks assessed                      Exercises Other Exercises Other Exercises: supine therex, 1x10 AAROM; ankle pumps, SLR, hip abd, Heel slides to improve overall strength and functional mobility. L LE required min assit and decreased ROM, R LE required mod assist w/ poor ROM    General Comments        Pertinent Vitals/Pain Pain Descriptors / Indicators: Aching;Sore Pain Intervention(s): Limited activity within patient's tolerance;Monitored during session    Home Living  Prior Function            PT Goals (current goals can now be found in the care plan section) Acute Rehab PT Goals Patient Stated Goal: To return home  PT Goal Formulation: With patient Time For Goal Achievement: 11/02/16 Potential to Achieve Goals: Fair Progress towards PT goals: Progressing toward goals    Frequency    BID      PT Plan Discharge plan needs to be updated    Co-evaluation             End of Session   Activity Tolerance: Patient limited by pain;Patient limited by fatigue Patient left: in chair;with chair alarm set;with call bell/phone within reach     Time: 0929-0950 PT Time Calculation (min)  (ACUTE ONLY): 21 min  Charges:                       G Codes:      Hadriel Northup 10/24/2016, 10:24 AM

## 2016-10-20 NOTE — Progress Notes (Signed)
Noted OT evaluation recommends SNF rehab at this time for pain and fatigue with his current condition. I concur with SNF recommendation. Please call me with any questions. [phone]

## 2016-10-20 NOTE — Progress Notes (Addendum)
Dialysis catheter placed. Dr. Wynelle LinkKolluru reports Dialysis will be in early morning.

## 2016-10-20 NOTE — Progress Notes (Signed)
Physical Therapy Treatment Patient Details Name: Juan Roberson. MRN: [id number] DOB: 1971-06-25 Today's Date: 10/20/2016    History of Present Illness Patient is a 46 y/o male that presents s/p I&D of R knee with underlying pneumonia and recent flu as well.   He is non WB RLE and on droplet precautions and MRSA precautions.    PT Comments    Pt complained of pain in R knee but willing to participate in Pt treatment. Pt able to scoot transfer laterally from chair to bed with min assist to guide R LE . Sitting balance improved to no UE support and patient able to sit in midline under Pt supervision. Pt performed sit to stand transfers (4 reps) with RW and required max assist x2  and max verbal cues to maintain NWBing status. Bed was raised during transfers to improve energy conservation. He displayed poor tolerance when standing due to weakness, fatigue and increase in pain. Pt's strength and mobility have improved from earlier today but he still demonstrates strength, ROM, and balance deficits that severely hinder safe functional mobility. He will continue to benefit from skilled PT and recommend STR upon discharge from acute care.    Follow Up Recommendations  SNF     Equipment Recommendations  Rolling walker with 5" wheels    Recommendations for Other Services       Precautions / Restrictions Precautions Precautions: Fall Restrictions Weight Bearing Restrictions: Yes RLE Weight Bearing: Non weight bearing    Mobility  Bed Mobility Overal bed mobility: Needs Assistance Bed Mobility: Supine to Sit       Sit to supine: Min assist   General bed mobility comments: min assist to guide R LE   Transfers Overall transfer level: Needs assistance Equipment used: Rolling walker (2 wheeled) Transfers: Sit to/from Stand;Lateral/Scoot Transfers Sit to Stand: Max assist;+2 physical assistance        Lateral/Scoot Transfers: Mod assist General transfer comment: sit to stand max  x2 with max cues to maintain NWBing status bed raised for sit to stand, scooting mod assist for lift off,   Ambulation/Gait             General Gait Details: did not attempt; max assist x2 to stand    Stairs            Wheelchair Mobility    Modified Rankin (Stroke Patients Only)       Balance Overall balance assessment: Needs assistance Sitting-balance support: Feet supported;No upper extremity supported Sitting balance-Leahy Scale: Good Sitting balance - Comments: pt able to sit at edge of chair and bed w/o UE support    Standing balance support: Bilateral upper extremity supported Standing balance-Leahy Scale: Poor Standing balance comment: require max assist x2 with max verbal cues to maintain NWBing status, poor tolerance due to fatigue and pain                    Cognition Arousal/Alertness: Awake/alert Behavior During Therapy: WFL for tasks assessed/performed Overall Cognitive Status: Within Functional Limits for tasks assessed                      Exercises Other Exercises Other Exercises: Transfer training - sit to stand max assist x2 to improve functional mobility x4 - required max verbal cues and RW to maintain NWbing status on R LE, able to remain standing w/ mod to max assist for 20 seconds,     General Comments  Pertinent Vitals/Pain Pain Assessment: Faces Faces Pain Scale: Hurts whole lot Pain Location: R knee Pain Descriptors / Indicators: Aching;Sore Pain Intervention(s): Monitored during session;Limited activity within patient's tolerance;Repositioned    Home Living                      Prior Function            PT Goals (current goals can now be found in the care plan section) Acute Rehab PT Goals Patient Stated Goal: To return home  PT Goal Formulation: With patient Time For Goal Achievement: 11/02/16 Potential to Achieve Goals: Fair Progress towards PT goals: Progressing toward goals     Frequency    BID      PT Plan Current plan remains appropriate    Co-evaluation             End of Session Equipment Utilized During Treatment: Gait belt Activity Tolerance: Patient limited by fatigue;Patient limited by pain Patient left: in bed;with call bell/phone within reach;with bed alarm set;with family/visitor present     Time: [id number] PT Time Calculation (min) (ACUTE ONLY): 17 min  Charges:  $Therapeutic Activity: 8-22 mins                    G Codes:      [employer] Alyas Creary Student PT 10/20/2016, 3:24 PM

## 2016-10-20 NOTE — Progress Notes (Signed)
BP 86/56 MD notified. Received order to do 500cc NS bolus

## 2016-10-20 NOTE — Evaluation (Signed)
Occupational Therapy Evaluation Patient Details Name: Juan BlossomWaltzie L Siedlecki Jr. MRN: [id number] DOB: 11/07/1970 Today's Date: 10/20/2016    History of Present Illness Patient is a 46 y/o male that presents s/p I&D of R knee with underlying pneumonia and recent flu as well.   He is non WB RLE and on droplet precautions and MRSA precautions.   Clinical Impression   Pt is 46 year old male s/p I & D of R knee with recent flu and pneumonia.  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  Pt currently requires rest breaks and encouragment to complete grooming and feeding skills due to fatigue from flu and pneumonia and total assist for LB dressing skills while in seated position due to pain and limited AROM of R knee. He is non-WB in RLE. Pt would benefit from instruction in dressing techniques with or without assistive devices for dressing and bathing skills.  Pt would also benefit from recommendations for home modifications to increase safety in the bathroom and prevent falls depending on where he plans to go after discharge (girlfriend's house or his home with his daughter who are in their late 2420s to early 7730s).  Rec SNF to continue rehab if pain and fatigue continue to be a factor in progress.  Otherwise, rec OT HH if he declines SNF and goes to one of the homes mentioned above.      Follow Up Recommendations  SNF    Equipment Recommendations  Tub/shower bench;Toilet riser    Recommendations for Other Services       Precautions / Restrictions Precautions Precautions: Fall Restrictions Weight Bearing Restrictions: Yes RLE Weight Bearing: Non weight bearing      Mobility Bed Mobility Overal bed mobility: Independent Bed Mobility: Supine to Sit     Supine to sit: Min assist     General bed mobility comments: min assit needed to guide R LE to edge of bed and pull up to sitting   Transfers Overall  transfer level: Needs assistance Equipment used: None Transfers: Lateral/Scoot Transfers          Lateral/Scoot Transfers: Min assist General transfer comment: required assistance guiding R knee, pain increased with mobility, able to use bilat UE to lift body and scoot short distances from bed to chair, required frequent verbal cues     Balance Overall balance assessment: Needs assistance Sitting-balance support: Feet supported;Bilateral upper extremity supported Sitting balance-Leahy Scale: Fair Sitting balance - Comments: required min guarding to maintain upright posture, stated he felt weak  Postural control: Posterior lean     Standing balance comment: did not attempt                             ADL Overall ADL's : Needs assistance/impaired                                       General ADL Comments: Pt currently requires rest breaks to feed self due to fatigue from flu but has full AROM of BUEs and hands with hx of bony prominence in R shoulder but functional ROM. He requires total assist for LB dressing at this time due to pain and fatigue.  Educated in rec for AD and home modifications for tub and toilet.  Vision     Perception     Praxis      Pertinent Vitals/Pain Pain Assessment: Faces Pain Score: 10-Worst pain ever Pain Location: R knee Pain Descriptors / Indicators: Aching;Sore Pain Intervention(s): Limited activity within patient's tolerance;Monitored during session;Premedicated before session     Hand Dominance Right   Extremity/Trunk Assessment Upper Extremity Assessment Upper Extremity Assessment: Generalized weakness   Lower Extremity Assessment Lower Extremity Assessment: Defer to PT evaluation       Communication Communication Communication: No difficulties   Cognition Arousal/Alertness: Awake/alert Behavior During Therapy: WFL for tasks assessed/performed Overall Cognitive Status: Within Functional Limits  for tasks assessed                     General Comments       Exercises   Other Exercises Other Exercises: supine therex, 1x10 AAROM; ankle pumps, SLR, hip abd, Heel slides to improve overall strength and functional mobility. L LE required min assit and decreased ROM, R LE required mod assist w/ poor ROM   Shoulder Instructions      Home Living Family/patient expects to be discharged to:: Private residence Living Arrangements: Children Available Help at Discharge: Family Type of Home: House Home Access: Level entry     Home Layout: One level     Bathroom Shower/Tub: Tub/shower unit Shower/tub characteristics: Engineer, building services: Standard Bathroom Accessibility: No   Home Equipment: None          Prior Functioning/Environment Level of Independence: Independent        Comments: Able to complete all ADLs, occupation, and driving. Works at a wearhouse doing heating and air.        OT Problem List: Decreased activity tolerance;Decreased knowledge of use of DME or AE;Pain   OT Treatment/Interventions: Self-care/ADL training;DME and/or AE instruction;Patient/family education;Therapeutic activities    OT Goals(Current goals can be found in the care plan section) Acute Rehab OT Goals Patient Stated Goal: To return home  OT Goal Formulation: With patient/family Time For Goal Achievement: 11/03/16 Potential to Achieve Goals: Good ADL Goals Pt Will Perform Lower Body Dressing: with min assist;with adaptive equipment;sit to/from stand (with no LOB) Pt Will Transfer to Toilet: with set-up;with mod assist;regular height toilet  OT Frequency: Min 1X/week   Barriers to D/C:            Co-evaluation              End of Session    Activity Tolerance: Patient limited by fatigue Patient left: in chair;with call bell/phone within reach;with chair alarm set;with family/visitor present   Time: 1005-1030 OT Time Calculation (min): 25 min Charges:  OT  General Charges $OT Visit: 1 Procedure OT Evaluation $OT Eval Low Complexity: 1 Procedure OT Treatments $Self Care/Home Management : 8-22 mins G-Codes:    Susanne Borders, OTR/L ascom [phone] 10/20/16, 10:42 AM

## 2016-10-20 NOTE — Consult Note (Signed)
[facility] Vascular Consult Note  MRN : [id number]  Juan Roberson. is a 46 y.o. (January 15, 1971) male who presents with chief complaint of  Chief Complaint  Patient presents with  . Influenza  . Abdominal Pain  . Knee Pain  .  History of Present Illness: I am asked to evaluate the patient by Dr. Wynelle Link for dialysis access. Patient is now initiating hemodialysis therapy. The patient  is a 46 y.o. male with a known history of Chronic kidney disease stage III, diabetes, essential hypertension, gastritis, history of DVT in the past was diagnosed with the flu on Sunday. He was started on therapy with Tamiflu. He he he also has been having diarrhea ongoing for the past few days. Which is progressively gotten worse. He states that he was on antibiotics about a month ago. He also has had fevers chills and bodyaches related to the flu. He has tested positive for the flow. Also he is steadily deteriorated and now has multiple uremic symptoms including lethargy changes in mental status nausea and vomiting and somnolence.  Current Facility-Administered Medications  Medication Dose Route Frequency Provider Last Rate Last Dose  . 0.45 % sodium chloride infusion   Intravenous Continuous Deeann Saint, MD 75 mL/hr at 10/18/16 2338    . acetaminophen (TYLENOL) tablet 650 mg  650 mg Oral Q6H PRN Deeann Saint, MD       Or  . acetaminophen (TYLENOL) suppository 650 mg  650 mg Rectal Q6H PRN Deeann Saint, MD      . amiodarone (PACERONE) tablet 200 mg  200 mg Oral Daily Vipul Shah, MD      . aspirin tablet 325 mg  325 mg Oral Daily Delfino Lovett, MD   325 mg at 10/20/16 0850  . bisacodyl (DULCOLAX) suppository 10 mg  10 mg Rectal Daily PRN Deeann Saint, MD      . Chlorhexidine Gluconate Cloth 2 % PADS 6 each  6 each Topical Q0600 Delfino Lovett, MD   2 each at 10/20/16 0609  . guaiFENesin (MUCINEX) 12 hr tablet 600 mg  600 mg Oral BID Delfino Lovett, MD   600 mg at 10/20/16 0850   And  .  dextromethorphan (DELSYM) 30 MG/5ML liquid 30 mg  30 mg Oral BID Delfino Lovett, MD   30 mg at 10/20/16 0849  . heparin injection 5,000 Units  5,000 Units Subcutaneous Q8H Altamese Dilling, MD      . HYDROcodone-acetaminophen (NORCO) 7.5-325 MG per tablet 1-2 tablet  1-2 tablet Oral Q4H PRN Deeann Saint, MD   2 tablet at 10/19/16 1339  . insulin aspart (novoLOG) injection 0-9 Units  0-9 Units Subcutaneous TID PC & HS Delfino Lovett, MD   1 Units at 10/20/16 1249  . insulin glargine (LANTUS) injection 25 Units  25 Units Subcutaneous Daily Delfino Lovett, MD   25 Units at 10/19/16 [phone]  . magnesium hydroxide (MILK OF MAGNESIA) suspension 30 mL  30 mL Oral Daily PRN Deeann Saint, MD      . methimazole (TAPAZOLE) tablet 10 mg  10 mg Oral Daily Delfino Lovett, MD   10 mg at 10/20/16 0850  . methocarbamol (ROBAXIN) tablet 500 mg  500 mg Oral Q6H PRN Deeann Saint, MD   500 mg at 10/20/16 1248   Or  . methocarbamol (ROBAXIN) 500 mg in dextrose 5 % 50 mL IVPB  500 mg Intravenous Q6H PRN Deeann Saint, MD      . metoCLOPramide (REGLAN) tablet 5-10 mg  5-10  mg Oral Q8H PRN Deeann Saint, MD       Or  . metoCLOPramide (REGLAN) injection 5-10 mg  5-10 mg Intravenous Q8H PRN Deeann Saint, MD      . metoprolol (LOPRESSOR) tablet 50 mg  50 mg Oral Daily Altamese Dilling, MD   50 mg at 10/19/16 0939  . morphine 4 MG/ML injection 2 mg  2 mg Intravenous Q2H PRN Deeann Saint, MD      . mupirocin ointment (BACTROBAN) 2 % 1 application  1 application Nasal BID Delfino Lovett, MD   1 application at 10/20/16 0851  . ondansetron (ZOFRAN) tablet 4 mg  4 mg Oral Q6H PRN Deeann Saint, MD       Or  . ondansetron [facility]) injection 4 mg  4 mg Intravenous Q6H PRN Deeann Saint, MD      . pantoprazole (PROTONIX) EC tablet 40 mg  40 mg Oral Daily Delfino Lovett, MD   40 mg at 10/20/16 0850  . senna (SENOKOT) tablet 8.6 mg  1 tablet Oral BID Deeann Saint, MD   8.6 mg at 10/20/16 0850  . sodium phosphate (FLEET) 7-19 GM/118ML enema 1  enema  1 enema Rectal Once PRN Deeann Saint, MD      . vancomycin (VANCOCIN) IVPB 1000 mg/200 mL premix  1,000 mg Intravenous Q dialysis Olene Floss, [facility]        Past Medical History:  Diagnosis Date  . Cellulitis and abscess of foot    Left-Dr. Ether Griffins  . CKD (chronic kidney disease), stage III   . Diabetes mellitus without complication (HCC)    a. Dx ~ 1996.  . Essential hypertension   . Gastritis    a. 04/2015 hematemesis -> EGD: gastritis, esophagitis, duodenitis.  No active bleeding.  PPI added.  [name] GERD (gastroesophageal reflux disease)   . Left leg DVT (HCC)    a. Dx 05/2015 -> Coumadin.  . MI (myocardial infarction)   . Osteomyelitis (HCC)    a. 05/2015 L foot.    Past Surgical History:  Procedure Laterality Date  . ESOPHAGOGASTRODUODENOSCOPY N/A 04/07/2015   Procedure: ESOPHAGOGASTRODUODENOSCOPY (EGD);  Surgeon: Midge Minium, MD;  Location: [facility] ENDOSCOPY;  Service: Endoscopy;  Laterality: N/A;  . ESOPHAGOGASTRODUODENOSCOPY (EGD) WITH PROPOFOL Left 06/30/2015   Procedure: ESOPHAGOGASTRODUODENOSCOPY (EGD) WITH PROPOFOL;  Surgeon: Christena Deem, MD;  Location: [facility] ENDOSCOPY;  Service: Endoscopy;  Laterality: Left;  . FOOT SURGERY    . I&D EXTREMITY Left 04/06/2015   Procedure: IRRIGATION AND DEBRIDEMENT EXTREMITY;  Surgeon: Gwyneth Revels, DPM;  Location: [facility] ORS;  Service: Podiatry;  Laterality: Left;  . IRRIGATION AND DEBRIDEMENT FOOT Left 05/21/2015   Procedure: IRRIGATION AND DEBRIDEMENT FOOT;  Surgeon: Linus Galas, MD;  Location: [facility] ORS;  Service: Podiatry;  Laterality: Left;  . IRRIGATION AND DEBRIDEMENT KNEE Right 10/18/2016   Procedure: IRRIGATION AND DEBRIDEMENT KNEE;  Surgeon: Deeann Saint, MD;  Location: [facility] ORS;  Service: Orthopedics;  Laterality: Right;  . KNEE ARTHROSCOPY  10/18/2016   Procedure: ARTHROSCOPY KNEE;  Surgeon: Deeann Saint, MD;  Location: [facility] ORS;  Service: Orthopedics;;    Social History Social History  Substance Use Topics  . Smoking  status: Never Smoker  . Smokeless tobacco: Never Used  . Alcohol use No    Family History Family History  Problem Relation Age of Onset  . Coronary artery disease Father   . Pancreatic cancer Mother   . Breast cancer Sister   . Lung cancer Brother   . Cervical cancer Sister  No family history of bleeding/clotting disorders, porphyria or autoimmune disease   Allergies  Allergen Reactions  . Sulfur Other (See Comments)    Pt states that this medication causes renal failure.       REVIEW OF SYSTEMS The patient is extremely lethargic and is not answering questions therefore review of systems is unobtainable   Physical Examination  Vitals:   10/19/16 2322 10/20/16 0410 10/20/16 0729 10/20/16 1200  BP: (!) 86/56 (!) 92/54 (!) 108/56 (!) 104/52  Pulse: 80 83 86 98  Resp: 18 (!) 22 20   Temp: 97.9 F (36.6 C) 97.4 F (36.3 C) 97.9 F (36.6 C)   TempSrc: Oral Oral Oral   SpO2: 96% 94% 91%   Weight:      Height:       Body mass index is 24.95 kg/m. Gen:  WD/WN, NAD Head: [place]/AT, No temporalis wasting. Prominent temp pulse not noted. Ear/Nose/Throat: Hearing grossly intact, nares w/o erythema or drainage, oropharynx w/o Erythema/Exudate Eyes: Sclera non-icteric, conjunctiva clear Neck: Trachea midline.  No JVD.  Pulmonary:  Good air movement, respirations not labored, equal bilaterally.  Cardiac: RRR, normal S1, S2. Vascular: Right knee is dressed Vessel Right Left  Radial Palpable Palpable  Ulnar Palpable Palpable  Brachial Palpable Palpable  Carotid Palpable, without bruit Palpable, without bruit  Aorta Not palpable N/A  Femoral Palpable Palpable  Popliteal Surgical dressing  Palpable  PT Palpable Palpable  DP Palpable Palpable   Gastrointestinal: soft, non-tender/non-distended. No guarding/reflex.  Musculoskeletal: M/S 4/5 throughout.  Extremities without ischemic changes.  2-3+ edema. Neurologic: Sensation grossly intact in extremities.  Symmetrical.  Speech  is normal. Motor exam as listed above. Psychiatric: Judgment intact, Mood & affect appropriate for pt's clinical situation. Dermatologic: Multiple tattoos.   Lymph : No Cervical, Axillary, or Inguinal lymphadenopathy.      CBC Lab Results  Component Value Date   WBC 9.3 10/20/2016   HGB 8.3 (L) 10/20/2016   HCT 25.7 (L) 10/20/2016   MCV 82.3 10/20/2016   PLT 74 (L) 10/20/2016    BMET    Component Value Date/Time   NA 125 (L) 10/20/2016 0805   NA 140 09/18/2014 1003   K 5.2 (H) 10/20/2016 0805   K 5.6 (H) 09/18/2014 1003   CL 97 (L) 10/20/2016 0805   CL 111 (H) 09/18/2014 1003   CO2 17 (L) 10/20/2016 0805   CO2 22 09/18/2014 1003   GLUCOSE 169 (H) 10/20/2016 0805   GLUCOSE 270 (H) 09/18/2014 1003   BUN 131 (H) 10/20/2016 0805   BUN 52 (H) 09/18/2014 1003   CREATININE 6.78 (H) 10/20/2016 0805   CREATININE 2.50 (H) 09/18/2014 1003   CALCIUM 8.3 (L) 10/20/2016 0805   CALCIUM 9.0 09/18/2014 1003   GFRNONAA 9 (L) 10/20/2016 0805   GFRNONAA 30 (L) 09/18/2014 1003   GFRNONAA 38 (L) 03/31/2014 2147   GFRAA 10 (L) 10/20/2016 0805   GFRAA 36 (L) 09/18/2014 1003   GFRAA 44 (L) 03/31/2014 2147   Estimated Creatinine Clearance: 15.7 mL/min (by C-G formula based on SCr of 6.78 mg/dL (H)).  COAG Lab Results  Component Value Date   INR 2.18 10/18/2016   INR 2.05 10/17/2016   INR 2.16 09/25/2016    Radiology Dg Chest 2 View  Result Date: 10/17/2016 CLINICAL DATA:  Diagnosed with flu 2 days ago, fever and diarrhea. Twisted RIGHT knee yesterday. History of hypertension, diabetes. EXAM: CHEST  2 VIEW COMPARISON:  Chest radiograph October 15, 2016 FINDINGS: Cardiac silhouette is  mildly enlarged and unchanged. Mediastinal silhouette is nonsuspicious. Low lung volumes. Strandy densities LEFT lung base with small bilateral pleural effusions. No pneumothorax. Soft tissue planes and included osseous structures are nonsuspicious. Possible cholelithiasis. IMPRESSION: Stable cardiomegaly.  Small LEFT pleural effusion with LEFT lung base atelectasis, less likely pneumonia. Electronically Signed   By: Awilda Metro M.D.   On: 10/17/2016 17:57   Dg Chest 2 View  Result Date: 10/15/2016 CLINICAL DATA:  Cough, shortness of breath, vomiting, diarrhea for 1 week. EXAM: CHEST  2 VIEW COMPARISON:  09/24/2016. FINDINGS: Stable mildly enlarged cardiac silhouette. Interval small left pleural effusion and small amount of left basilar atelectasis. Minimal right basilar atelectasis. Small posterior right pleural effusion. The bones appear osteopenic. IMPRESSION: Small bilateral pleural effusions with minimal bibasilar atelectasis. Electronically Signed   By: Beckie Salts M.D.   On: 10/15/2016 14:12   Dg Knee 1-2 Views Right  Result Date: 10/17/2016 CLINICAL DATA:  46 year old male with right knee pain after twisting knee yesterday walking to bathroom. Initial encounter. EXAM: RIGHT KNEE - 1-2 VIEW COMPARISON:  01/07/2016. FINDINGS: Two-view examination without evidence of fracture or dislocation. Lateral soft tissue swelling may be present. Mild medial and lateral tibiofemoral joint space narrowing. Mild to moderate patellofemoral joint degenerative changes. Moderate joint effusion. IMPRESSION: Two-view exam without fracture or dislocation. Tricompartment degenerative changes with joint effusion as noted above. Lateral soft tissue injury is a possibility. Electronically Signed   By: Lacy Duverney M.D.   On: 10/17/2016 17:59   Mr Knee Right Wo Contrast  Result Date: 10/18/2016 CLINICAL DATA:  46 year old with severe right knee pain and inability to walk. Possible septic knee or cellulitis. History of diabetes, chronic kidney disease and hypertension. EXAM: MRI OF THE RIGHT KNEE WITHOUT CONTRAST TECHNIQUE: Multiplanar, multisequence MR imaging of the knee was performed. No intravenous contrast was administered. COMPARISON:  Radiographs 10/17/2016. FINDINGS: MENISCI Medial meniscus: Mild degeneration.  No evidence of meniscal tear or displaced fragment. Lateral meniscus:  Intact with normal morphology. LIGAMENTS Cruciates:  Intact. Collaterals:  Intact. CARTILAGE Patellofemoral: Moderate patellofemoral chondral thinning, surface irregularity and subchondral cyst formation laterally in the patella and trochlea. Medial: Mild chondral thinning and surface irregularity without focal defect. There is mild subchondral cyst formation peripherally in the medial tibial plateau. Lateral: Mild chondral thinning and surface irregularity with small osteophytes. MISCELLANEOUS Joint:  Moderate-sized mildly complex appearing joint effusion. Popliteal Fossa:  Unremarkable. No significant Baker's cyst. Extensor Mechanism:  Intact. Bones: No acute osseous findings are demonstrated. There is no cortical destruction or subchondral edema to suggest infection. Other: There is mild subcutaneous edema surrounding the knee, greatest anteriorly and laterally. No focal fluid collections are seen. IMPRESSION: 1. Nonspecific subcutaneous edema surrounding the knee, greatest anterolaterally. This could reflect cellulitis. 2. Nonspecific moderate size knee joint effusion. No specific evidence of septic arthritis or osteomyelitis. Arthrocentesis should be considered if there is strong clinical concern of infection. 3. Tricompartmental degenerative changes, greatest within the patellofemoral compartment. 4. The menisci, cruciate and collateral ligaments are intact. Electronically Signed   By: Carey Bullocks M.D.   On: 10/18/2016 10:44   Dg Chest Portable 1 View  Result Date: 09/24/2016 CLINICAL DATA:  Chest pain today. EXAM: PORTABLE CHEST 1 VIEW COMPARISON:  Single-view of the chest 07/21/2016. PA and lateral chest 07/17/2016. FINDINGS: The lungs are clear. There is cardiomegaly. No pneumothorax or pleural effusion. IMPRESSION: No acute disease. Electronically Signed   By: Drusilla Kanner M.D.   On: 09/24/2016 14:02  Assessment/Plan 1. Acute on chronic renal failure Patient will require initiation of dialysis as he is now floridly uremic. I will place a femoral temporary catheter as he is in no condition for a tunneled catheter. I will select the left femoral approach given the infection of the right knee. This will preserve his right internal jugular vein for a tunneled catheter when he is more appropriately recovered  2.  Sepsis multifactorial Due to pneumonia as well as his knee we'll treat with IV Zosyn and vancomycin Positive for influenza  3. Right knee cellulitis and possible septic joint Status post drainage of the joint await cell count Patient unable to walk will obtain MRI of the knee Orthopedic consult  4. Diarrhea  C. difficile is ruled out  comprehensive stool evaluation in progress  5. Diabetes type 2 Will place on sliding scale insulin continue home regimen   Levora Dredge, MD  10/20/2016 9:17 PM    This note was created with Dragon medical transcription system.  Any error is purely unintentional

## 2016-10-20 NOTE — Progress Notes (Signed)
[facility]  ROUNDING NOTE   Subjective:   Juan Roberson Dec. admitted to [facility] on 10/17/2016 for Pain [R52] Acidosis, metabolic [E87.2] AKI (acute kidney injury) (HCC) [N17.9] Knee pain, acute [M25.569] Sepsis, due to unspecified organism [facility]) [A41.9]  Patient admitted and has found to have MRSA septic joint. Irrigation and debridement by Dr. Hyacinth Meeker on 1/16.   Patient's creatinine and BUN are critical.  Girlfriend at bedside.   Objective:  Vital signs in last 24 hours:  Temp:  [97.4 F (36.3 C)-97.9 F (36.6 C)] 97.9 F (36.6 C) (01/19 0729) Pulse Rate:  [79-98] 98 (01/19 1200) Resp:  [18-22] 20 (01/19 0729) BP: (81-108)/(52-62) 104/52 (01/19 1200) SpO2:  [91 %-99 %] 91 % (01/19 0729)  Weight change:  Filed Weights   10/17/16 1248 10/17/16 2019  Weight: 88 kg (194 lb) 88.1 kg (194 lb 4.8 oz)    Intake/Output: I/O last 3 completed shifts: In: 1748.8 [P.O.:240; I.V.:1258.8; Other:100; IV Piggyback:150] Out: 8 [Urine:3; Blood:5]   Intake/Output this shift:  No intake/output data recorded.  Physical Exam: General: NAD,   Head: Normocephalic, atraumatic. Moist oral mucosal membranes  Eyes: Anicteric, PERRL  Neck: Supple, trachea midline  Lungs:  Clear to auscultation  Heart: Regular rate and rhythm  Abdomen:  Soft, nontender  Extremities: trace peripheral edema.  Neurologic: Nonfocal, moving all four extremities  Skin: No lesions  Access: none    Basic Metabolic Panel:  Recent Labs Lab 10/15/16 1310 10/17/16 1250 10/18/16 0011 10/19/16 0453 10/20/16 0805  NA 129* 125* 127* 130* 125*  K 4.8 5.0 4.7 5.1 5.2*  CL 99* 93* 100* 101 97*  CO2 21* 18* 19* 17* 17*  GLUCOSE 378* 410* 267* 197* 169*  BUN 75* 94* 97* 107* 131*  CREATININE 3.96* 4.98* 5.18* 5.59* 6.78*  CALCIUM 8.8* 8.9 8.0* 8.4* 8.3*    Liver Function Tests:  Recent Labs Lab 10/15/16 1310 10/17/16 1250  AST 35 61*  ALT 16* 22  ALKPHOS 108 109  BILITOT 1.4* 2.3*   PROT 6.9 7.3  ALBUMIN 2.9* 2.9*    Recent Labs Lab 10/15/16 1310 10/17/16 1250  LIPASE 20 24   No results for input(s): AMMONIA in the last 168 hours.  CBC:  Recent Labs Lab 10/15/16 1310 10/17/16 1250 10/18/16 0011 10/20/16 0805  WBC 11.3* 19.7* 12.6* 9.3  HGB 9.9* 10.5* 8.4* 8.3*  HCT 30.7* 32.4* 26.4* 25.7*  MCV 83.8 83.4 83.0 82.3  PLT 109* 151 93* 74*    Cardiac Enzymes: No results for input(s): CKTOTAL, CKMB, CKMBINDEX, TROPONINI in the last 168 hours.  BNP: Invalid input(s): POCBNP  CBG:  Recent Labs Lab 10/19/16 1132 10/19/16 1646 10/19/16 2222 10/20/16 0730 10/20/16 1103  GLUCAP 189* 193* 165* 168* 134*    Microbiology: Results for orders placed or performed during the hospital encounter of 10/17/16  Blood culture (routine x 2)     Status: Abnormal   Collection Time: 10/17/16  5:11 PM  Result Value Ref Range Status   Specimen Description BLOOD LEFT HAND  Final   Special Requests   Final    BOTTLES DRAWN AEROBIC AND ANAEROBIC AER ANA   Culture  Setup Time   Final    GRAM POSITIVE COCCI IN BOTH AEROBIC AND ANAEROBIC BOTTLES CRITICAL RESULT CALLED TO, READ BACK BY AND VERIFIED WITH: CHRISTINE KATSOUDAS 10/18/16 1610 SGD Performed at [facility], [address], [place], [place] [address]    Culture METHICILLIN RESISTANT STAPHYLOCOCCUS AUREUS (A)  Final   Report Status 10/20/2016 FINAL  Final   Organism ID, Bacteria METHICILLIN RESISTANT STAPHYLOCOCCUS AUREUS  Final      Susceptibility   Methicillin resistant staphylococcus aureus - MIC*    CIPROFLOXACIN >=8 RESISTANT Resistant     ERYTHROMYCIN >=8 RESISTANT Resistant     GENTAMICIN <=0.5 SENSITIVE Sensitive     OXACILLIN 2 RESISTANT Resistant     TETRACYCLINE <=1 SENSITIVE Sensitive     VANCOMYCIN <=0.5 SENSITIVE Sensitive     TRIMETH/SULFA <=10 SENSITIVE Sensitive     CLINDAMYCIN <=0.25 SENSITIVE Sensitive     RIFAMPIN <=0.5 SENSITIVE Sensitive     Inducible Clindamycin  NEGATIVE Sensitive     * METHICILLIN RESISTANT STAPHYLOCOCCUS AUREUS  Blood Culture ID Panel (Reflexed)     Status: Abnormal   Collection Time: 10/17/16  5:11 PM  Result Value Ref Range Status   Enterococcus species NOT DETECTED NOT DETECTED Final   Listeria monocytogenes NOT DETECTED NOT DETECTED Final   Staphylococcus species DETECTED (A) NOT DETECTED Final    Comment: CRITICAL RESULT CALLED TO, READ BACK BY AND VERIFIED WITH: CHRISTINE KATSOUDAS 10/18/16 0925 SGD    Staphylococcus aureus DETECTED (A) NOT DETECTED Final    Comment: CRITICAL RESULT CALLED TO, READ BACK BY AND VERIFIED WITH: CHRISTINE KATSOUDAS 10/18/16 0925 SGD    Methicillin resistance DETECTED (A) NOT DETECTED Final    Comment: CRITICAL RESULT CALLED TO, READ BACK BY AND VERIFIED WITH: CHRISTINE KATSOUDAS 10/18/16 0925 SGD    Streptococcus species NOT DETECTED NOT DETECTED Final   Streptococcus agalactiae NOT DETECTED NOT DETECTED Final   Streptococcus pneumoniae NOT DETECTED NOT DETECTED Final   Streptococcus pyogenes NOT DETECTED NOT DETECTED Final   Acinetobacter baumannii NOT DETECTED NOT DETECTED Final   Enterobacteriaceae species NOT DETECTED NOT DETECTED Final   Enterobacter cloacae complex NOT DETECTED NOT DETECTED Final   Escherichia coli NOT DETECTED NOT DETECTED Final   Klebsiella oxytoca NOT DETECTED NOT DETECTED Final   Klebsiella pneumoniae NOT DETECTED NOT DETECTED Final   Proteus species NOT DETECTED NOT DETECTED Final   Serratia marcescens NOT DETECTED NOT DETECTED Final   Haemophilus influenzae NOT DETECTED NOT DETECTED Final   Neisseria meningitidis NOT DETECTED NOT DETECTED Final   Pseudomonas aeruginosa NOT DETECTED NOT DETECTED Final   Candida albicans NOT DETECTED NOT DETECTED Final   Candida glabrata NOT DETECTED NOT DETECTED Final   Candida krusei NOT DETECTED NOT DETECTED Final   Candida parapsilosis NOT DETECTED NOT DETECTED Final   Candida tropicalis NOT DETECTED NOT DETECTED Final   Body fluid culture     Status: None (Preliminary result)   Collection Time: 10/17/16  6:22 PM  Result Value Ref Range Status   Specimen Description Right Knee  Final   Special Requests NONE  Final   Gram Stain   Final    MODERATE WBC PRESENT, PREDOMINANTLY PMN FEW GRAM POSITIVE COCCI IN PAIRS Performed at [facility], [address] [place], [place] [address]    Culture   Final    ABUNDANT METHICILLIN RESISTANT STAPHYLOCOCCUS AUREUS NO ANAEROBES ISOLATED; CULTURE IN PROGRESS FOR 5 DAYS    Report Status PENDING  Incomplete   Organism ID, Bacteria METHICILLIN RESISTANT STAPHYLOCOCCUS AUREUS  Final      Susceptibility   Methicillin resistant staphylococcus aureus - MIC*    CIPROFLOXACIN >=8 RESISTANT Resistant     ERYTHROMYCIN >=8 RESISTANT Resistant     GENTAMICIN <=0.5 SENSITIVE Sensitive     OXACILLIN 2 RESISTANT Resistant  TETRACYCLINE <=1 SENSITIVE Sensitive     VANCOMYCIN <=0.5 SENSITIVE Sensitive     TRIMETH/SULFA <=10 SENSITIVE Sensitive     CLINDAMYCIN <=0.25 SENSITIVE Sensitive     RIFAMPIN <=0.5 SENSITIVE Sensitive     Inducible Clindamycin NEGATIVE Sensitive     * ABUNDANT METHICILLIN RESISTANT STAPHYLOCOCCUS AUREUS  Blood culture (routine x 2)     Status: None (Preliminary result)   Collection Time: 10/17/16  8:37 PM  Result Value Ref Range Status   Specimen Description BLOOD RIGHT ARM  Final   Special Requests BOTTLES DRAWN AEROBIC AND ANAEROBIC  9CC  Final   Culture NO GROWTH 3 DAYS  Final   Report Status PENDING  Incomplete  MRSA PCR Screening     Status: Abnormal   Collection Time: 10/17/16  8:50 PM  Result Value Ref Range Status   MRSA by PCR POSITIVE (A) NEGATIVE Final    Comment:        The GeneXpert MRSA Assay (FDA approved for NASAL specimens only), is one component of a comprehensive MRSA colonization surveillance program. It is not intended to diagnose MRSA infection nor to guide or monitor treatment for MRSA infections. RESULT  CALLED TO, READ BACK BY AND VERIFIED WITH: VIVIAN ALI AT 2242 ON 10/17/2016 JLJ   Culture, blood (single) w Reflex to ID Panel     Status: None (Preliminary result)   Collection Time: 10/18/16 11:36 AM  Result Value Ref Range Status   Specimen Description BLOOD L HAND  Final   Special Requests BOTTLES DRAWN AEROBIC AND ANAEROBIC 2ML  Final   Culture NO GROWTH 2 DAYS  Final   Report Status PENDING  Incomplete  Aerobic/Anaerobic Culture (surgical/deep wound)     Status: None (Preliminary result)   Collection Time: 10/18/16  9:18 PM  Result Value Ref Range Status   Specimen Description KNEE  Final   Special Requests NONE  Final   Gram Stain PENDING  Incomplete   Culture ABUNDANT STAPHYLOCOCCUS AUREUS  Final   Report Status PENDING  Incomplete    Coagulation Studies:  Recent Labs  10/17/16 2036 10/18/16 0011  LABPROT 23.4* 24.6*  INR 2.05 2.18    Urinalysis:  Recent Labs  10/17/16 1251  COLORURINE AMBER*  LABSPEC 1.016  PHURINE 5.0  GLUCOSEU 150*  HGBUR MODERATE*  BILIRUBINUR NEGATIVE  KETONESUR NEGATIVE  PROTEINUR 100*  NITRITE NEGATIVE  LEUKOCYTESUR NEGATIVE      Imaging: No results found.   Medications:   . sodium chloride 75 mL/hr at 10/18/16 2338   . amiodarone  200 mg Oral Daily  . aspirin  325 mg Oral Daily  . Chlorhexidine Gluconate Cloth  6 each Topical Q0600  . cloNIDine  0.3 mg Transdermal Weekly  . guaiFENesin  600 mg Oral BID   And  . dextromethorphan  30 mg Oral BID  . heparin subcutaneous  5,000 Units Subcutaneous Q8H  . insulin aspart  0-9 Units Subcutaneous TID PC & HS  . insulin glargine  25 Units Subcutaneous Daily  . methimazole  10 mg Oral Daily  . metoprolol  50 mg Oral Daily  . mupirocin ointment  1 application Nasal BID  . pantoprazole  40 mg Oral Daily  . senna  1 tablet Oral BID   acetaminophen **OR** acetaminophen, bisacodyl, HYDROcodone-acetaminophen, magnesium hydroxide, methocarbamol **OR** methocarbamol (ROBAXIN)  IV,  metoCLOPramide **OR** metoCLOPramide (REGLAN) injection, morphine injection, ondansetron **OR** ondansetron (ZOFRAN) IV, sodium phosphate  Assessment/ Plan:  Mr. Queen BlossomWaltzie L Delorey Jr. is a 46 y.o.  white male with past medical history of hypertension, insulin dependent diabetes mellitus type 2, diabetic peripheral neuropathy  1. 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 Chronic kidney disease secondary to diabetic nephropathy Acute renal failure from sepsis - source MRSA from septic joint. Concern that with progressive disease patient may be at end stage renal disease - Consult vascular surgery for dialysis access - Discussed with family and patient about dialysis. Patient is agreeable for dialysis.   2. Hypertension: currently with hypotension from sepsis - clonidine and metoprolol  3. Anemia of chronic kidney disease: Hemoglobin 8.3  - epo with dialysis treatment.   4. Secondary Hyperparathyroidism:  - Check PTH, calcium and phosphorus.    LOS: 3 Juan Roberson 1/19/201812:24 PM

## 2016-10-20 NOTE — Progress Notes (Signed)
Subjective: 2 Days Post-Op Procedure(s) (LRB): IRRIGATION AND DEBRIDEMENT KNEE (Right) ARTHROSCOPY KNEE    Patient reports pain as moderate.  His white blood count is come down to normal.  His renal failure has advanced and he scheduled to start dialysis.  He will remain on IV antibiotics.  I explained to him that he already has some degenerative change in the knee and this may get worse with time.  Objective:   VITALS:   Vitals:   10/20/16 0729 10/20/16 1200  BP: (!) 108/56 (!) 104/52  Pulse: 86 98  Resp: 20   Temp: 97.9 F (36.6 C)     Neurologically intact ABD soft Neurovascular intact Sensation intact distally Intact pulses distally Dorsiflexion/Plantar flexion intact Incision: no drainage  LABS  Recent Labs  10/18/16 0011 10/20/16 0805  HGB 8.4* 8.3*  HCT 26.4* 25.7*  WBC 12.6* 9.3  PLT 93* 74*     Recent Labs  10/18/16 0011 10/19/16 0453 10/20/16 0805  NA 127* 130* 125*  K 4.7 5.1 5.2*  BUN 97* 107* 131*  CREATININE 5.18* 5.59* 6.78*  GLUCOSE 267* 197* 169*     Recent Labs  10/17/16 2036 10/18/16 0011  INR 2.05 2.18     Assessment/Plan: 2 Days Post-Op Procedure(s) (LRB): IRRIGATION AND DEBRIDEMENT KNEE (Right) ARTHROSCOPY KNEE   Advance diet Up with therapy

## 2016-10-20 NOTE — Consult Note (Addendum)
Pharmacy Antibiotic Note  Juan BlossomWaltzie L Fiveash Jr. is a 46 y.o. male admitted on 10/17/2016 with MRSA bacteremia, septic knee, possible PNA.  Pharmacy has been consulted for vancomycin and zosyn dosing.  Plan: Pt renal function continues to decline- Plans for HD. If pt receives a full or close to full HD session will need to give 1g at the end of the session. If only partial, will consider a smaller supplemental dose.  Pharmacy will continue to follow pt for renal replacement plans and vanc dosing.  Height: 6\' 2"  (188 cm) Weight: 194 lb 4.8 oz (88.1 kg) IBW/kg (Calculated) : 82.2  Temp (24hrs), Avg:97.7 F (36.5 C), Min:97.4 F (36.3 C), Max:97.9 F (36.6 C)   Recent Labs Lab 10/15/16 1310 10/17/16 1250 10/17/16 1712 10/17/16 2045 10/18/16 0011 10/19/16 0453 10/20/16 0805  WBC 11.3* 19.7*  --   --  12.6*  --  9.3  CREATININE 3.96* 4.98*  --   --  5.18* 5.59* 6.78*  LATICACIDVEN  --   --  2.8* 2.2* 1.5  --   --     Estimated Creatinine Clearance: 15.7 mL/min (by C-G formula based on SCr of 6.78 mg/dL (H)).    Allergies  Allergen Reactions  . Sulfur Other (See Comments)    Pt states that this medication causes renal failure.      Antimicrobials this admission: vancomycin 1/16 >>  zosyn 1/16 >>   Dose adjustments this admission:   Microbiology results: 1/16 BCx: MRSA 2/4 so far 1/16 MRSA PCR: positive Joint fluid: few gram positive cocci in pair  Thank you for allowing pharmacy to be a part of this patient's care.  Olene FlossMelissa D Jaedin Regina, Pharm.D, BCPS Clinical Pharmacist  10/20/2016 10:16 AM

## 2016-10-20 NOTE — Progress Notes (Signed)
lovenox changed to heparin due to pt renal fx per protocol  Olene FlossMelissa D Phylicia Mcgaugh, Pharm.D, BCPS Clinical Pharmacist

## 2016-10-20 NOTE — Progress Notes (Addendum)
Clinical Education officer, museum (CSW) met with patient and his girlfriend to present bed offers for SNF. They chose [facility]. Per [facility] admissions coordinator at [place] he will start [facility] authorization today. Per RN patient is going for an acute dialysis session today. Per RN patient will likely not be ready for D/C over the weekend.   Per [facility] authorization has been received.   [employer], LCSW [phone]

## 2016-10-21 LAB — BASIC METABOLIC PANEL
Anion gap: 13 (ref 5–15)
BUN: 141 mg/dL — AB (ref 6–20)
CHLORIDE: 99 mmol/L — AB (ref 101–111)
CO2: 15 mmol/L — ABNORMAL LOW (ref 22–32)
Calcium: 8 mg/dL — ABNORMAL LOW (ref 8.9–10.3)
Creatinine, Ser: 7.31 mg/dL — ABNORMAL HIGH (ref 0.61–1.24)
GFR calc Af Amer: 9 mL/min — ABNORMAL LOW (ref >60)
GFR calc non Af Amer: 8 mL/min — ABNORMAL LOW (ref >60)
Glucose, Bld: 118 mg/dL — ABNORMAL HIGH (ref 65–99)
POTASSIUM: 5.3 mmol/L — AB (ref 3.5–5.1)
SODIUM: 127 mmol/L — AB (ref 135–145)

## 2016-10-21 LAB — CBC WITH DIFFERENTIAL/PLATELET
BASOS PCT: 0 %
Basophils Absolute: 0 10*3/uL (ref 0–0.1)
Eosinophils Absolute: 0.2 10*3/uL (ref 0–0.7)
Eosinophils Relative: 2 %
HEMATOCRIT: 25.3 % — AB (ref 40.0–52.0)
HEMOGLOBIN: 8.1 g/dL — AB (ref 13.0–18.0)
Lymphocytes Relative: 6 %
Lymphs Abs: 0.6 10*3/uL — ABNORMAL LOW (ref 1.0–3.6)
MCH: 26.7 pg (ref 26.0–34.0)
MCHC: 31.9 g/dL — AB (ref 32.0–36.0)
MCV: 83.5 fL (ref 80.0–100.0)
MONOS PCT: 9 %
Monocytes Absolute: 0.8 10*3/uL (ref 0.2–1.0)
NEUTROS ABS: 8 10*3/uL — AB (ref 1.4–6.5)
NEUTROS PCT: 83 %
Platelets: 80 10*3/uL — ABNORMAL LOW (ref 150–440)
RBC: 3.03 MIL/uL — ABNORMAL LOW (ref 4.40–5.90)
RDW: 16.3 % — ABNORMAL HIGH (ref 11.5–14.5)
WBC: 9.6 10*3/uL (ref 3.8–10.6)

## 2016-10-21 LAB — CBC
HEMATOCRIT: 25.5 % — AB (ref 40.0–52.0)
Hemoglobin: 8.3 g/dL — ABNORMAL LOW (ref 13.0–18.0)
MCH: 27 pg (ref 26.0–34.0)
MCHC: 32.6 g/dL (ref 32.0–36.0)
MCV: 82.9 fL (ref 80.0–100.0)
Platelets: 76 10*3/uL — ABNORMAL LOW (ref 150–440)
RBC: 3.08 MIL/uL — ABNORMAL LOW (ref 4.40–5.90)
RDW: 16.3 % — AB (ref 11.5–14.5)
WBC: 9 10*3/uL (ref 3.8–10.6)

## 2016-10-21 LAB — FERRITIN: Ferritin: 114 ng/mL (ref 24–336)

## 2016-10-21 LAB — VANCOMYCIN, RANDOM: Vancomycin Rm: 27

## 2016-10-21 LAB — IRON AND TIBC
Iron: 13 ug/dL — ABNORMAL LOW (ref 45–182)
SATURATION RATIOS: 7 % — AB (ref 17.9–39.5)
TIBC: 192 ug/dL — AB (ref 250–450)
UIBC: 179 ug/dL

## 2016-10-21 LAB — PHOSPHORUS: Phosphorus: 7.3 mg/dL — ABNORMAL HIGH (ref 2.5–4.6)

## 2016-10-21 LAB — GLUCOSE, CAPILLARY
GLUCOSE-CAPILLARY: 119 mg/dL — AB (ref 65–99)
GLUCOSE-CAPILLARY: 146 mg/dL — AB (ref 65–99)
Glucose-Capillary: 124 mg/dL — ABNORMAL HIGH (ref 65–99)
Glucose-Capillary: 115 mg/dL — ABNORMAL HIGH (ref 65–99)
Glucose-Capillary: 145 mg/dL — ABNORMAL HIGH (ref 65–99)

## 2016-10-21 MED ORDER — NEPRO/CARBSTEADY PO LIQD
237.0000 mL | Freq: Two times a day (BID) | ORAL | Status: DC
  Administered 2016-10-22 – 2016-10-24 (×5): 237 mL via ORAL

## 2016-10-21 MED ORDER — VANCOMYCIN HCL IN DEXTROSE 1-5 GM/200ML-% IV SOLN
1000.0000 mg | Freq: Once | INTRAVENOUS | Status: AC
Start: 2016-10-21 — End: 2016-10-21
  Administered 2016-10-21: 1000 mg via INTRAVENOUS
  Filled 2016-10-21: qty 200

## 2016-10-21 MED ORDER — EPOETIN ALFA 10000 UNIT/ML IJ SOLN
10000.0000 [IU] | Freq: Once | INTRAMUSCULAR | Status: AC
  Administered 2016-10-21: 10000 [IU] via INTRAVENOUS
  Filled 2016-10-21: qty 1

## 2016-10-21 MED ORDER — POLYETHYLENE GLYCOL 3350 17 G PO PACK
17.0000 g | PACK | Freq: Every day | ORAL | Status: DC
  Administered 2016-10-21 – 2016-10-23 (×3): 17 g via ORAL
  Filled 2016-10-21 (×4): qty 1

## 2016-10-21 NOTE — Progress Notes (Signed)
[facility]  ROUNDING NOTE   Subjective:   Seen and examined on hemodialysis. First treatment. Tolerating treatment well. UF goal of 0.5kg.   Patient with some shortness of breath this morning.   Objective:  Vital signs in last 24 hours:  Temp:  [97.7 F (36.5 C)-97.9 F (36.6 C)] 97.7 F (36.5 C) (01/20 0722) Pulse Rate:  [98-115] 103 (01/20 0722) Resp:  [18] 18 (01/20 0722) BP: (104-127)/(52-94) 120/94 (01/20 0722) SpO2:  [95 %-97 %] 97 % (01/20 0722)  Weight change:  Filed Weights   10/17/16 1248 10/17/16 2019  Weight: 88 kg (194 lb) 88.1 kg (194 lb 4.8 oz)    Intake/Output: No intake/output data recorded.   Intake/Output this shift:  No intake/output data recorded.  Physical Exam: General: NAD, laying in bed  Head: Normocephalic, atraumatic. Moist oral mucosal membranes  Eyes: Anicteric, PERRL  Neck: Supple, trachea midline  Lungs:  Clear to auscultation  Heart: Regular rate and rhythm  Abdomen:  Soft, nontender  Extremities: trace peripheral edema, right knee drain  Neurologic: Nonfocal, moving all four extremities  Skin: No lesions  Access: Left femoral temp HD catheter 1/19 Dr. Gilda Crease    Basic Metabolic Panel:  Recent Labs Lab 10/17/16 1250 10/18/16 0011 10/19/16 0453 10/20/16 0805 10/21/16 0339  NA 125* 127* 130* 125* 127*  K 5.0 4.7 5.1 5.2* 5.3*  CL 93* 100* 101 97* 99*  CO2 18* 19* 17* 17* 15*  GLUCOSE 410* 267* 197* 169* 118*  BUN 94* 97* 107* 131* 141*  CREATININE 4.98* 5.18* 5.59* 6.78* 7.31*  CALCIUM 8.9 8.0* 8.4* 8.3* 8.0*    Liver Function Tests:  Recent Labs Lab 10/15/16 1310 10/17/16 1250  AST 35 61*  ALT 16* 22  ALKPHOS 108 109  BILITOT 1.4* 2.3*  PROT 6.9 7.3  ALBUMIN 2.9* 2.9*    Recent Labs Lab 10/15/16 1310 10/17/16 1250  LIPASE 20 24   No results for input(s): AMMONIA in the last 168 hours.  CBC:  Recent Labs Lab 10/15/16 1310 10/17/16 1250 10/18/16 0011 10/20/16 0805 10/21/16 0339   WBC 11.3* 19.7* 12.6* 9.3 9.0  HGB 9.9* 10.5* 8.4* 8.3* 8.3*  HCT 30.7* 32.4* 26.4* 25.7* 25.5*  MCV 83.8 83.4 83.0 82.3 82.9  PLT 109* 151 93* 74* 76*    Cardiac Enzymes: No results for input(s): CKTOTAL, CKMB, CKMBINDEX, TROPONINI in the last 168 hours.  BNP: Invalid input(s): POCBNP  CBG:  Recent Labs Lab 10/20/16 0730 10/20/16 1103 10/20/16 1754 10/20/16 2148 10/21/16 0724  GLUCAP 168* 134* 132* 129* 124*    Microbiology: Results for orders placed or performed during the hospital encounter of 10/17/16  Blood culture (routine x 2)     Status: Abnormal   Collection Time: 10/17/16  5:11 PM  Result Value Ref Range Status   Specimen Description BLOOD LEFT HAND  Final   Special Requests   Final    BOTTLES DRAWN AEROBIC AND ANAEROBIC AER ANA   Culture  Setup Time   Final    GRAM POSITIVE COCCI IN BOTH AEROBIC AND ANAEROBIC BOTTLES CRITICAL RESULT CALLED TO, READ BACK BY AND VERIFIED WITH: CHRISTINE KATSOUDAS 10/18/16 1610 SGD Performed at [facility], [address] [place], [place] [address]    Culture METHICILLIN RESISTANT STAPHYLOCOCCUS AUREUS (A)  Final   Report Status 10/20/2016 FINAL  Final   Organism ID, Bacteria METHICILLIN RESISTANT STAPHYLOCOCCUS AUREUS  Final      Susceptibility   Methicillin resistant staphylococcus aureus -  MIC*    CIPROFLOXACIN >=8 RESISTANT Resistant     ERYTHROMYCIN >=8 RESISTANT Resistant     GENTAMICIN <=0.5 SENSITIVE Sensitive     OXACILLIN 2 RESISTANT Resistant     TETRACYCLINE <=1 SENSITIVE Sensitive     VANCOMYCIN <=0.5 SENSITIVE Sensitive     TRIMETH/SULFA <=10 SENSITIVE Sensitive     CLINDAMYCIN <=0.25 SENSITIVE Sensitive     RIFAMPIN <=0.5 SENSITIVE Sensitive     Inducible Clindamycin NEGATIVE Sensitive     * METHICILLIN RESISTANT STAPHYLOCOCCUS AUREUS  Blood Culture ID Panel (Reflexed)     Status: Abnormal   Collection Time: 10/17/16  5:11 PM  Result Value Ref Range Status   Enterococcus species NOT  DETECTED NOT DETECTED Final   Listeria monocytogenes NOT DETECTED NOT DETECTED Final   Staphylococcus species DETECTED (A) NOT DETECTED Final    Comment: CRITICAL RESULT CALLED TO, READ BACK BY AND VERIFIED WITH: CHRISTINE KATSOUDAS 10/18/16 0925 SGD    Staphylococcus aureus DETECTED (A) NOT DETECTED Final    Comment: CRITICAL RESULT CALLED TO, READ BACK BY AND VERIFIED WITH: CHRISTINE KATSOUDAS 10/18/16 0925 SGD    Methicillin resistance DETECTED (A) NOT DETECTED Final    Comment: CRITICAL RESULT CALLED TO, READ BACK BY AND VERIFIED WITH: CHRISTINE KATSOUDAS 10/18/16 0925 SGD    Streptococcus species NOT DETECTED NOT DETECTED Final   Streptococcus agalactiae NOT DETECTED NOT DETECTED Final   Streptococcus pneumoniae NOT DETECTED NOT DETECTED Final   Streptococcus pyogenes NOT DETECTED NOT DETECTED Final   Acinetobacter baumannii NOT DETECTED NOT DETECTED Final   Enterobacteriaceae species NOT DETECTED NOT DETECTED Final   Enterobacter cloacae complex NOT DETECTED NOT DETECTED Final   Escherichia coli NOT DETECTED NOT DETECTED Final   Klebsiella oxytoca NOT DETECTED NOT DETECTED Final   Klebsiella pneumoniae NOT DETECTED NOT DETECTED Final   Proteus species NOT DETECTED NOT DETECTED Final   Serratia marcescens NOT DETECTED NOT DETECTED Final   Haemophilus influenzae NOT DETECTED NOT DETECTED Final   Neisseria meningitidis NOT DETECTED NOT DETECTED Final   Pseudomonas aeruginosa NOT DETECTED NOT DETECTED Final   Candida albicans NOT DETECTED NOT DETECTED Final   Candida glabrata NOT DETECTED NOT DETECTED Final   Candida krusei NOT DETECTED NOT DETECTED Final   Candida parapsilosis NOT DETECTED NOT DETECTED Final   Candida tropicalis NOT DETECTED NOT DETECTED Final  Body fluid culture     Status: None   Collection Time: 10/17/16  6:22 PM  Result Value Ref Range Status   Specimen Description Right Knee  Final   Special Requests NONE  Final   Gram Stain   Final    MODERATE WBC  PRESENT, PREDOMINANTLY PMN FEW GRAM POSITIVE COCCI IN PAIRS    Culture   Final    ABUNDANT METHICILLIN RESISTANT STAPHYLOCOCCUS AUREUS NO ANAEROBES ISOLATED Performed at [facility], [address] [place], [place] [address]    Report Status 10/20/2016 FINAL  Final   Organism ID, Bacteria METHICILLIN RESISTANT STAPHYLOCOCCUS AUREUS  Final      Susceptibility   Methicillin resistant staphylococcus aureus - MIC*    CIPROFLOXACIN >=8 RESISTANT Resistant     ERYTHROMYCIN >=8 RESISTANT Resistant     GENTAMICIN <=0.5 SENSITIVE Sensitive     OXACILLIN 2 RESISTANT Resistant     TETRACYCLINE <=1 SENSITIVE Sensitive     VANCOMYCIN <=0.5 SENSITIVE Sensitive     TRIMETH/SULFA <=10 SENSITIVE Sensitive     CLINDAMYCIN <=0.25 SENSITIVE Sensitive     RIFAMPIN <=0.5 SENSITIVE Sensitive  Inducible Clindamycin NEGATIVE Sensitive     * ABUNDANT METHICILLIN RESISTANT STAPHYLOCOCCUS AUREUS  Blood culture (routine x 2)     Status: None (Preliminary result)   Collection Time: 10/17/16  8:37 PM  Result Value Ref Range Status   Specimen Description BLOOD RIGHT ARM  Final   Special Requests BOTTLES DRAWN AEROBIC AND ANAEROBIC  9CC  Final   Culture NO GROWTH 4 DAYS  Final   Report Status PENDING  Incomplete  MRSA PCR Screening     Status: Abnormal   Collection Time: 10/17/16  8:50 PM  Result Value Ref Range Status   MRSA by PCR POSITIVE (A) NEGATIVE Final    Comment:        The GeneXpert MRSA Assay (FDA approved for NASAL specimens only), is one component of a comprehensive MRSA colonization surveillance program. It is not intended to diagnose MRSA infection nor to guide or monitor treatment for MRSA infections. RESULT CALLED TO, READ BACK BY AND VERIFIED WITH: VIVIAN ALI AT 2242 ON 10/17/2016 JLJ   Culture, blood (single) w Reflex to ID Panel     Status: None (Preliminary result)   Collection Time: 10/18/16 11:36 AM  Result Value Ref Range Status   Specimen Description BLOOD L HAND   Final   Special Requests BOTTLES DRAWN AEROBIC AND ANAEROBIC  Final   Culture NO GROWTH 3 DAYS  Final   Report Status PENDING  Incomplete  Aerobic/Anaerobic Culture (surgical/deep wound)     Status: None (Preliminary result)   Collection Time: 10/18/16  9:18 PM  Result Value Ref Range Status   Specimen Description KNEE  Final   Special Requests NONE  Final   Gram Stain   Final    ABUNDANT WBC PRESENT, PREDOMINANTLY PMN MODERATE GRAM POSITIVE COCCI IN PAIRS IN CLUSTERS Performed at [facility], [address] [place], [place] [address]    Culture ABUNDANT STAPHYLOCOCCUS AUREUS  Final   Report Status PENDING  Incomplete    Coagulation Studies: No results for input(s): LABPROT, INR in the last 72 hours.  Urinalysis: No results for input(s): COLORURINE, LABSPEC, PHURINE, GLUCOSEU, HGBUR, BILIRUBINUR, KETONESUR, PROTEINUR, UROBILINOGEN, NITRITE, LEUKOCYTESUR in the last 72 hours.  Invalid input(s): APPERANCEUR    Imaging: No results found.   Medications:    . amiodarone  200 mg Oral Daily  . aspirin  325 mg Oral Daily  . Chlorhexidine Gluconate Cloth  6 each Topical Q0600  . guaiFENesin  600 mg Oral BID   And  . dextromethorphan  30 mg Oral BID  . heparin subcutaneous  5,000 Units Subcutaneous Q8H  . insulin aspart  0-9 Units Subcutaneous TID PC & HS  . insulin glargine  25 Units Subcutaneous Daily  . methimazole  10 mg Oral Daily  . metoprolol  50 mg Oral Daily  . mupirocin ointment  1 application Nasal BID  . pantoprazole  40 mg Oral Daily  . senna  1 tablet Oral BID  . vancomycin  1,000 mg Intravenous Once   acetaminophen **OR** acetaminophen, bisacodyl, HYDROcodone-acetaminophen, methocarbamol **OR** methocarbamol (ROBAXIN)  IV, metoCLOPramide **OR** metoCLOPramide (REGLAN) injection, morphine injection, ondansetron **OR** ondansetron (ZOFRAN) IV  Assessment/ Plan:  [name] is a 46 y.o. white male with past medical history of  hypertension, insulin dependent diabetes mellitus type 2, diabetic peripheral neuropathy  1. 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 Chronic kidney disease secondary to diabetic nephropathy Acute renal failure from sepsis - source MRSA from septic joint. Concern that with progressive disease patient may be at end stage renal disease. Will monitor renal function on a daily basis - Consult vascular surgery for dialysis access: if needs dialysis further: will plan for tunneled dialysis catheter on Tuesday.  - Discussed with family and patient about dialysis. If outpatient dialysis required, plan on [name]   2. Hypertension: currently with hypotension from sepsis - clonidine and metoprolol  3. Anemia of chronic kidney disease: Hemoglobin 8.3  - epo with dialysis treatment.   4. Secondary Hyperparathyroidism:  - Check PTH, calcium and phosphorus.    LOS: 4 Juan Roberson 1/20/201810:41 AM

## 2016-10-21 NOTE — Progress Notes (Signed)
[facility] [place] [facility]   PATIENT NAME: Ali Mohl    MR#:  [id number]  DATE OF BIRTH:  November 06, 1970  SUBJECTIVE:  CHIEF COMPLAINT:   Chief Complaint  Patient presents with  . Influenza  . Abdominal Pain  . Knee Pain  still having lots of knee pain and requesting pain meds, unable to bear weight still. BP is running low, and have gradually worsening renal function.  pt also appears some lethargic today.  REVIEW OF SYSTEMS:  Review of Systems  Constitutional: Negative for chills, fever and weight loss.  HENT: Negative for nosebleeds and sore throat.   Eyes: Negative for blurred vision.  Respiratory: Negative for cough, shortness of breath and wheezing.   Cardiovascular: Negative for chest pain, orthopnea, leg swelling and PND.  Gastrointestinal: Negative for abdominal pain, constipation, diarrhea, heartburn, nausea and vomiting.  Genitourinary: Negative for dysuria and urgency.  Musculoskeletal: Positive for joint pain. Negative for back pain.  Skin: Negative for rash.  Neurological: Negative for dizziness, speech change, focal weakness and headaches.  Endo/Heme/Allergies: Does not bruise/bleed easily.  Psychiatric/Behavioral: Negative for depression.    DRUG ALLERGIES:   Allergies  Allergen Reactions  . Sulfur Other (See Comments)    Pt states that this medication causes renal failure.     VITALS:  Blood pressure 124/60, pulse 95, temperature 98 F (36.7 C), temperature source Oral, resp. rate 20, height 6\' 2"  (1.88 m), weight 96.5 kg (212 lb 11.9 oz), SpO2 98 %. PHYSICAL EXAMINATION:  Physical Exam  Constitutional: He is oriented to person, place, and time and well-developed, well-nourished, and in no distress.  HENT:  Head: Normocephalic and atraumatic.  Eyes: Conjunctivae and EOM are normal. Pupils are equal, round, and reactive to light.  Neck: Normal range of motion. Neck supple. No tracheal deviation present. No thyromegaly  present.  Cardiovascular: Normal rate, regular rhythm and normal heart sounds.   Pulmonary/Chest: Effort normal and breath sounds normal. No respiratory distress. He has no wheezes. He exhibits no tenderness.  Abdominal: Soft. Bowel sounds are normal. He exhibits no distension. There is no tenderness.  Musculoskeletal:       Right knee: He exhibits decreased range of motion and effusion. Tenderness found.  Neurological: He is alert and oriented to person, place, and time. No cranial nerve deficit.  Skin: Skin is warm and dry. No rash noted.  Psychiatric: Mood and affect normal.   LABORATORY PANEL:   CBC  Recent Labs Lab 10/21/16 1012  WBC 9.6  HGB 8.1*  HCT 25.3*  PLT 80*   ------------------------------------------------------------------------------------------------------------------ Chemistries   Recent Labs Lab 10/17/16 1250  10/21/16 0339  NA 125*  < > 127*  K 5.0  < > 5.3*  CL 93*  < > 99*  CO2 18*  < > 15*  GLUCOSE 410*  < > 118*  BUN 94*  < > 141*  CREATININE 4.98*  < > 7.31*  CALCIUM 8.9  < > 8.0*  AST 61*  --   --   ALT 22  --   --   ALKPHOS 109  --   --   BILITOT 2.3*  --   --   < > = values in this interval not displayed. RADIOLOGY:  No results found. ASSESSMENT AND PLAN:  Patient is a 46 year old white male recently diagnosed with the flu now presents with sepsis  * MRSA Bacteremia, Sepsis: present on admission. Due to septic joint - continue IV vancomycin - reviewed fluid  c/s  Now with renal failure and need for HD, we may need to wait for final outcome and adjust Abx accordingly.  * Septic Rt knee - Irrigation and debridement s/p arthoscopy on 1/17- reviewed cx result.  * Hyponatremia: 125-> 130> 127 again - IVFs and monitor  * Influenza: On Tamiflu - will give total 5 days.  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  * Diarrhea Resolved,  could be viral  * Diabetes type 2 - continue sliding scale insulin - continue lantus and monitor  * History of DVT: no longer taking coumadin (his PCP asked him to stop)  * hypotension   Stopped clonidine patch.   Now stable.   All the records are reviewed and case discussed with Care Management/Social Worker. Management plans discussed with the patient, family (girlfriend at bedside), Nursing and they are in agreement.  CODE STATUS: FULL CODE  TOTAL TIME TAKING CARE OF THIS PATIENT: 35 minutes.   More than 50% of the time was spent in counseling/coordination of care: YES  POSSIBLE D/C IN 1-2 DAYS, DEPENDING ON CLINICAL CONDITION.   Altamese DillingVACHHANI, Ranvir Renovato M.D on 10/21/2016 at 3:17 PM  Between 7am to 6pm - Pager - [phone]  After 6pm go to [email or web address] - Social research officer, governmentpassword EPAS [facility]  [facility] [place] [facility]  Office  [phone]  CC: Primary care physician; Corky DownsMASOUD,JAVED, MD  Note: This dictation was prepared with Dragon dictation along with smaller phrase technology. Any transcriptional errors that result from this process are unintentional.

## 2016-10-21 NOTE — Progress Notes (Signed)
Subjective: 3 Days Post-Op Procedure(s) (LRB): IRRIGATION AND DEBRIDEMENT KNEE (Right) ARTHROSCOPY KNEE    Patient reports pain as moderate. Getting dialysis now.  Somnolent.  CSM good right leg.  Wbc normal  Objective:   VITALS:   Vitals:   10/21/16 1100 10/21/16 1130  BP: (!) 102/40 (!) 101/39  Pulse: (!) 102 100  Resp:    Temp:      Neurologically intact ABD soft Neurovascular intact Sensation intact distally Intact pulses distally Dorsiflexion/Plantar flexion intact Incision: no drainage  LABS  Recent Labs  10/20/16 0805 10/21/16 0339  HGB 8.3* 8.3*  HCT 25.7* 25.5*  WBC 9.3 9.0  PLT 74* 76*     Recent Labs  10/19/16 0453 10/20/16 0805 10/21/16 0339  NA 130* 125* 127*  K 5.1 5.2* 5.3*  BUN 107* 131* 141*  CREATININE 5.59* 6.78* 7.31*  GLUCOSE 197* 169* 118*    No results for input(s): LABPT, INR in the last 72 hours.   Assessment/Plan: 3 Days Post-Op Procedure(s) (LRB): IRRIGATION AND DEBRIDEMENT KNEE (Right) ARTHROSCOPY KNEE   Up with therapy

## 2016-10-21 NOTE — Progress Notes (Signed)
PT Cancellation Note  Patient Details Name: Juan BlossomWaltzie L Wilkowski Jr. MRN: [id number] DOB: 03/05/1971   Cancelled Treatment:    Reason Eval/Treat Not Completed: Patient at procedure or test/unavailable.  Pt preparing for dialysis treatment in his room as he is on isolation precautions.  Will attempt to see pt later today for bed level treatment session (limited due to new temporary femoral catheter).     Encarnacion ChuAshley Nataliya Graig PT, DPT 10/21/2016, 9:40 AM

## 2016-10-21 NOTE — Progress Notes (Signed)
POST DIALYSIS ASSESSMENT 

## 2016-10-21 NOTE — Progress Notes (Signed)
PRE DIALYSIS ASSESSMENT 

## 2016-10-21 NOTE — Progress Notes (Signed)
Pt not eating on a regular diet. Spoke with dr Esaw Grandchildvachanni who ordered renal,carb diet  Along with nepro supplement bid

## 2016-10-21 NOTE — Progress Notes (Signed)
fsbs low. No eating . md orders d/c lantus and continue ssi

## 2016-10-21 NOTE — Progress Notes (Signed)
PT Cancellation Note  Patient Details Name: Juan BlossomWaltzie L Duff Jr. MRN: [id number] DOB: 05/13/1971   Cancelled Treatment:    Reason Eval/Treat Not Completed: Other (comment).  Spoke with Dr. Hyacinth MeekerMiller on the phone who confirmed pt's new WB status to be PWB RLE.  He also requested that PT be held until tomorrow (10/22/16) as pt receiving dialysis today.     Encarnacion ChuAshley Aly Roberson PT, DPT 10/21/2016, 1:48 PM

## 2016-10-21 NOTE — Progress Notes (Signed)
Assumed care of patient at 4:30pm. Patient is asleep.   Harvie HeckMelanie Jozalynn Noyce, RN

## 2016-10-21 NOTE — Progress Notes (Signed)
Pt with  Suspected deep tissue pressure injury to coccyx. Dr Esaw Grandchildvachanni notified on rounds. Constipated . md orders miralax

## 2016-10-21 NOTE — Progress Notes (Signed)
HD STARTED  

## 2016-10-21 NOTE — Progress Notes (Signed)
PT Cancellation Note  Patient Details Name: Queen BlossomWaltzie L Nodarse Jr. MRN: [id number] DOB: 07/17/1971   Cancelled Treatment:    Reason Eval/Treat Not Completed: Medical issues which prohibited therapy (Pt with new temporary femoral catheter).  Per PT protocol pt is to remain on bedrest with HOB<45 deg.  Will hold PT until catheter removed and pt appropriate for mobility.   Encarnacion ChuAshley Abashian PT, DPT 10/21/2016, 8:39 AM

## 2016-10-21 NOTE — Consult Note (Signed)
Pharmacy Antibiotic Note  Juan BlossomWaltzie L Stefano Jr. is a 46 y.o. male admitted on 10/17/2016 with MRSA bacteremia, septic knee, possible PNA.  Pharmacy has been consulted for vancomycin and zosyn dosing. Zosyn d/c 10/20/16.  Plan: Pt renal function continues to decline- Plans for HD. If pt receives a full or close to full HD session will need to give 1g at the end of the session. If only partial, will consider a smaller supplemental dose.  Pharmacy will continue to follow pt for renal replacement plans and vanc dosing.  1/20 0314 dialysis catheter placed, plan for HD in AM. Ordered random vancomycin level for now to assess need for vancomycin dose before HD today.   1/20 0339 vanc random 27 mcg/mL. Patient does not need additional dose at this time - resume dosing post HD.   1/20- HD to begin today. Ordered Vancomycin 1 gram IV to be given at end of HD. Follow daily for HD/Vancomycin dosing until HD schedule determined.    Height: 6\' 2"  (188 cm) Weight: 194 lb 4.8 oz (88.1 kg) IBW/kg (Calculated) : 82.2  Temp (24hrs), Avg:97.8 F (36.6 C), Min:97.7 F (36.5 C), Max:97.9 F (36.6 C)   Recent Labs Lab 10/15/16 1310 10/17/16 1250 10/17/16 1712 10/17/16 2045 10/18/16 0011 10/19/16 0453 10/20/16 0805 10/21/16 0339  WBC 11.3* 19.7*  --   --  12.6*  --  9.3 9.0  CREATININE 3.96* 4.98*  --   --  5.18* 5.59* 6.78* 7.31*  LATICACIDVEN  --   --  2.8* 2.2* 1.5  --   --   --   VANCORANDOM  --   --   --   --   --   --   --  27    Estimated Creatinine Clearance: 14.5 mL/min (by C-G formula based on SCr of 7.31 mg/dL (H)).    Allergies  Allergen Reactions  . Sulfur Other (See Comments)    Pt states that this medication causes renal failure.      Antimicrobials this admission: vancomycin 1/16 >>  zosyn 1/16 >> 1/19  Dose adjustments this admission:   Microbiology results: 1/16 BCx: MRSA 2/4 so far 1/16 MRSA PCR: positive Joint fluid: few gram positive cocci in pair  Thank you for  allowing pharmacy to be a part of this patient's care.  Bari MantisKristin Melvin Whiteford PharmD Clinical Pharmacist 10/21/2016

## 2016-10-21 NOTE — Consult Note (Addendum)
Pharmacy Antibiotic Note  Juan BlossomWaltzie L Jurgens Jr. is a 46 y.o. male admitted on 10/17/2016 with MRSA bacteremia, septic knee, possible PNA.  Pharmacy has been consulted for vancomycin and zosyn dosing.  Plan: Pt renal function continues to decline- Plans for HD. If pt receives a full or close to full HD session will need to give 1g at the end of the session. If only partial, will consider a smaller supplemental dose.  Pharmacy will continue to follow pt for renal replacement plans and vanc dosing.  1/20 0314 dialysis catheter placed, plan for HD in AM. Ordered random vancomycin level for now to assess need for vancomycin dose before HD today.   1/20 0339 vanc random 27 mcg/mL. Patient does not need additional dose at this time - resume dosing post HD.   Height: 6\' 2"  (188 cm) Weight: 194 lb 4.8 oz (88.1 kg) IBW/kg (Calculated) : 82.2  Temp (24hrs), Avg:97.7 F (36.5 C), Min:97.4 F (36.3 C), Max:97.9 F (36.6 C)   Recent Labs Lab 10/15/16 1310 10/17/16 1250 10/17/16 1712 10/17/16 2045 10/18/16 0011 10/19/16 0453 10/20/16 0805  WBC 11.3* 19.7*  --   --  12.6*  --  9.3  CREATININE 3.96* 4.98*  --   --  5.18* 5.59* 6.78*  LATICACIDVEN  --   --  2.8* 2.2* 1.5  --   --     Estimated Creatinine Clearance: 15.7 mL/min (by C-G formula based on SCr of 6.78 mg/dL (H)).    Allergies  Allergen Reactions  . Sulfur Other (See Comments)    Pt states that this medication causes renal failure.      Antimicrobials this admission: vancomycin 1/16 >>  zosyn 1/16 >>   Dose adjustments this admission:   Microbiology results: 1/16 BCx: MRSA 2/4 so far 1/16 MRSA PCR: positive Joint fluid: few gram positive cocci in pair  Thank you for allowing pharmacy to be a part of this patient's care.  Daniah Zaldivar A. [place], VermontPharm.D., BCPS Clinical Pharmacist 10/21/2016 3:14 AM

## 2016-10-21 NOTE — Progress Notes (Signed)
HD COMPLETED  

## 2016-10-22 LAB — HEPATITIS B SURFACE ANTIGEN: Hepatitis B Surface Ag: NEGATIVE

## 2016-10-22 LAB — GLUCOSE, CAPILLARY
GLUCOSE-CAPILLARY: 195 mg/dL — AB (ref 65–99)
GLUCOSE-CAPILLARY: 172 mg/dL — AB (ref 65–99)
Glucose-Capillary: 166 mg/dL — ABNORMAL HIGH (ref 65–99)
Glucose-Capillary: 160 mg/dL — ABNORMAL HIGH (ref 65–99)

## 2016-10-22 LAB — CULTURE, BLOOD (ROUTINE X 2): CULTURE: NO GROWTH

## 2016-10-22 LAB — CREATININE, SERUM
CREATININE: 6.42 mg/dL — AB (ref 0.61–1.24)
GFR, EST AFRICAN AMERICAN: 11 mL/min — AB (ref >60)
GFR, EST NON AFRICAN AMERICAN: 9 mL/min — AB (ref >60)

## 2016-10-22 LAB — COMPREHENSIVE METABOLIC PANEL
ALK PHOS: 94 U/L (ref 38–126)
ALT: <5 U/L — ABNORMAL LOW (ref 17–63)
ANION GAP: 16 — AB (ref 5–15)
AST: 22 U/L (ref 15–41)
Albumin: 2 g/dL — ABNORMAL LOW (ref 3.5–5.0)
BILIRUBIN TOTAL: 3.6 mg/dL — AB (ref 0.3–1.2)
BUN: 123 mg/dL — AB (ref 6–20)
CALCIUM: 8.2 mg/dL — AB (ref 8.9–10.3)
CO2: 15 mmol/L — ABNORMAL LOW (ref 22–32)
Chloride: 99 mmol/L — ABNORMAL LOW (ref 101–111)
Creatinine, Ser: 6.7 mg/dL — ABNORMAL HIGH (ref 0.61–1.24)
GFR calc Af Amer: 10 mL/min — ABNORMAL LOW (ref >60)
GFR, EST NON AFRICAN AMERICAN: 9 mL/min — AB (ref >60)
Glucose, Bld: 188 mg/dL — ABNORMAL HIGH (ref 65–99)
POTASSIUM: 5.6 mmol/L — AB (ref 3.5–5.1)
Sodium: 130 mmol/L — ABNORMAL LOW (ref 135–145)
TOTAL PROTEIN: 5.8 g/dL — AB (ref 6.5–8.1)

## 2016-10-22 LAB — HEPATITIS B CORE ANTIBODY, TOTAL: Hep B Core Total Ab: NEGATIVE

## 2016-10-22 LAB — PHOSPHORUS: Phosphorus: 7.4 mg/dL — ABNORMAL HIGH (ref 2.5–4.6)

## 2016-10-22 LAB — HEPATITIS C ANTIBODY: HCV Ab: <0.1 s/co ratio (ref 0.0–0.9)

## 2016-10-22 LAB — HEPATITIS B SURFACE ANTIBODY,QUALITATIVE: Hep B S Ab: NONREACTIVE

## 2016-10-22 MED ORDER — VANCOMYCIN HCL IN DEXTROSE 1-5 GM/200ML-% IV SOLN
1000.0000 mg | Freq: Once | INTRAVENOUS | Status: AC
  Administered 2016-10-22: 1000 mg via INTRAVENOUS
  Filled 2016-10-22: qty 200

## 2016-10-22 NOTE — Progress Notes (Signed)
PRE DIALYSIS ASSESSMENT 

## 2016-10-22 NOTE — Progress Notes (Signed)
Physical Therapy Treatment Patient Details Name: Juan BlossomWaltzie L Billiot Jr. MRN: [id number] DOB: 10/03/1970 Today's Date: 10/22/2016    History of Present Illness Patient is a 46 y/o male that presents s/p I&D of R knee with underlying pneumonia and recent flu as well.     PT Comments    Patient demonstrates increased lethargy at today's session compared to previously described sessions. Required moderate-maximal verbal/tactile cues to perform therapeutic exercises described. Updated WB status noted to be PWB on R LE; however, bed/mobility/transfers/gait deferred at session due to placement of L LE femoral catheter. Patient will continue to benefit from progressive functional strength/balance training when medically appropriate at future session.  Follow Up Recommendations  SNF     Equipment Recommendations  Rolling walker with 5" wheels    Recommendations for Other Services       Precautions / Restrictions Precautions Precautions: Fall Restrictions Weight Bearing Restrictions: Yes RLE Weight Bearing: Partial weight bearing    Mobility  Bed Mobility               General bed mobility comments: Bed mobility deferred due to femoral catheter in L LE  Transfers                    Ambulation/Gait                 Stairs            Wheelchair Mobility    Modified Rankin (Stroke Patients Only)       Balance                                    Cognition Arousal/Alertness: Lethargic Behavior During Therapy: Flat affect Overall Cognitive Status: Within Functional Limits for tasks assessed                      Exercises General Exercises - Lower Extremity Ankle Circles/Pumps: AAROM;15 reps Quad Sets: AAROM;Strengthening;10 reps Gluteal Sets: AAROM;Strengthening;10 reps Short Arc Quad: AAROM;Strengthening;10 reps Heel Slides: AAROM;Strengthening;Right Hip ABduction/ADduction: AAROM;Strengthening;Right;10 reps Straight Leg  Raises: AAROM;Strengthening;Right;10 reps    General Comments        Pertinent Vitals/Pain Pain Assessment: 0-10 Pain Score: 10-Worst pain ever Pain Location: R knee Pain Descriptors / Indicators: Aching Pain Intervention(s): Limited activity within patient's tolerance;Monitored during session;Patient requesting pain meds-RN notified    Home Living                      Prior Function            PT Goals (current goals can now be found in the care plan section) Acute Rehab PT Goals Patient Stated Goal: To return home  PT Goal Formulation: With patient Time For Goal Achievement: 11/02/16 Potential to Achieve Goals: Fair Progress towards PT goals: PT to reassess next treatment    Frequency    BID      PT Plan Current plan remains appropriate    Co-evaluation             End of Session   Activity Tolerance: Patient limited by lethargy;Patient limited by pain Patient left: in bed;with call bell/phone within reach;with bed alarm set;with family/visitor present;with SCD's reapplied     Time: [id number] PT Time Calculation (min) (ACUTE ONLY): 23 min  Charges:  $Therapeutic Exercise: 23-37 mins  G Codes:      Neita Carp, PT, DPT 10/22/2016, 10:13 AM

## 2016-10-22 NOTE — Consult Note (Signed)
Pharmacy Antibiotic Note  Juan BlossomWaltzie L [place] Jr. is a 46 y.o. male admitted on 10/17/2016 with MRSA bacteremia, septic knee, possible PNA.  Pharmacy has been consulted for vancomycin and zosyn dosing. Zosyn d/c 10/20/16.  Plan: Pt renal function continues to decline- Plans for HD. If pt receives a full or close to full HD session will need to give 1g at the end of the session. If only partial, will consider a smaller supplemental dose.  Pharmacy will continue to follow pt for renal replacement plans and vanc dosing.  1/20 0314 dialysis catheter placed, plan for HD in AM. Ordered random vancomycin level for now to assess need for vancomycin dose before HD today.   1/20 0339 vanc random 27 mcg/mL. Patient does not need additional dose at this time - resume dosing post HD.   1/20- HD to begin today. Ordered Vancomycin 1 gram IV to be given at end of HD. Follow daily for HD/Vancomycin dosing until HD schedule determined.  1/21- HD again today. Will order Vanc 1 gram x 1. Possible HD again tomorrow. Nephrology assessing daily for HD need. Follow daily and order Vancomycin until schedule is determined.    Height: 6\' 2"  (188 cm) Weight: 212 lb 11.9 oz (96.5 kg) IBW/kg (Calculated) : 82.2  Temp (24hrs), Avg:97.9 F (36.6 C), Min:97.3 F (36.3 C), Max:98.5 F (36.9 C)   Recent Labs Lab 10/17/16 1250 10/17/16 1712 10/17/16 2045 10/18/16 0011 10/19/16 0453 10/20/16 0805 10/21/16 0339 10/21/16 1012 10/22/16 0412 10/22/16 1125  WBC 19.7*  --   --  12.6*  --  9.3 9.0 9.6  --   --   CREATININE 4.98*  --   --  5.18* 5.59* 6.78* 7.31*  --  6.42* 6.70*  LATICACIDVEN  --  2.8* 2.2* 1.5  --   --   --   --   --   --   VANCORANDOM  --   --   --   --   --   --  27  --   --   --     Estimated Creatinine Clearance: 15.8 mL/min (by C-G formula based on SCr of 6.7 mg/dL (H)).    Allergies  Allergen Reactions  . Sulfur Other (See Comments)    Pt states that this medication causes renal failure.       Antimicrobials this admission: vancomycin 1/16 >>  zosyn 1/16 >> 1/19  Dose adjustments this admission:   Microbiology results: 1/16 BCx: MRSA 2/4 so far 1/16 MRSA PCR: positive Joint fluid: few gram positive cocci in pair  Thank you for allowing pharmacy to be a part of this patient's care.  Bari MantisKristin Mieke Brinley PharmD Clinical Pharmacist 10/22/2016

## 2016-10-22 NOTE — Progress Notes (Signed)
HD STARTED  

## 2016-10-22 NOTE — Progress Notes (Signed)
Patient potassium level 5.6. MD notified. No new orders at this time. Patient still drowsy and awaiting hemodialysis. Access, Trialysis in left grion clean, dry, and intact. Patient does not have appetite, receiving nepro between meals. Not making any urine at this time.   Harvie HeckMelanie Aleah Ahlgrim, RN

## 2016-10-22 NOTE — Progress Notes (Signed)
[facility] [place] [facility] [place] [facility]   PATIENT NAME: Juan ButtersWaltzie Keach    MR#:  [id number]  DATE OF BIRTH:  04/07/1971  SUBJECTIVE:  CHIEF COMPLAINT:   Chief Complaint  Patient presents with  . Influenza  . Abdominal Pain  . Knee Pain  still having lots of knee pain and requesting pain meds, unable to bear weight still. BP is running low, and have gradually worsening renal function.  pt also appears some lethargic today.   he had temporary catheter and started HD yesterday. REVIEW OF SYSTEMS:  Review of Systems  Constitutional: Negative for chills, fever and weight loss.  HENT: Negative for nosebleeds and sore throat.   Eyes: Negative for blurred vision.  Respiratory: Negative for cough, shortness of breath and wheezing.   Cardiovascular: Negative for chest pain, orthopnea, leg swelling and PND.  Gastrointestinal: Negative for abdominal pain, constipation, diarrhea, heartburn, nausea and vomiting.  Genitourinary: Negative for dysuria and urgency.  Musculoskeletal: Positive for joint pain. Negative for back pain.  Skin: Negative for rash.  Neurological: Negative for dizziness, speech change, focal weakness and headaches.  Endo/Heme/Allergies: Does not bruise/bleed easily.  Psychiatric/Behavioral: Negative for depression.    DRUG ALLERGIES:   Allergies  Allergen Reactions  . Sulfur Other (See Comments)    Pt states that this medication causes renal failure.     VITALS:  Blood pressure 92/78, pulse 85, temperature 97.6 F (36.4 C), temperature source Axillary, resp. rate 18, height 6\' 2"  (1.88 m), weight 93.6 kg (206 lb 5.6 oz), SpO2 98 %. PHYSICAL EXAMINATION:  Physical Exam  Constitutional: He is oriented to person, place, and time and well-developed, well-nourished, and in no distress.  HENT:  Head: Normocephalic and atraumatic.  Eyes: Conjunctivae and EOM are normal. Pupils are equal, round, and reactive to light.  Neck: Normal range of motion. Neck  supple. No tracheal deviation present. No thyromegaly present.  Cardiovascular: Normal rate, regular rhythm and normal heart sounds.   Pulmonary/Chest: Effort normal and breath sounds normal. No respiratory distress. He has no wheezes. He exhibits no tenderness.  Abdominal: Soft. Bowel sounds are normal. He exhibits no distension. There is no tenderness.  Musculoskeletal:       Right knee: He exhibits decreased range of motion and effusion. Tenderness found.  Neurological: He is alert and oriented to person, place, and time. No cranial nerve deficit.  Skin: Skin is warm and dry. No rash noted.  Psychiatric: Mood and affect normal.   LABORATORY PANEL:   CBC  Recent Labs Lab 10/21/16 1012  WBC 9.6  HGB 8.1*  HCT 25.3*  PLT 80*   ------------------------------------------------------------------------------------------------------------------ Chemistries   Recent Labs Lab 10/22/16 1125  NA 130*  K 5.6*  CL 99*  CO2 15*  GLUCOSE 188*  BUN 123*  CREATININE 6.70*  CALCIUM 8.2*  AST 22  ALT <5*  ALKPHOS 94  BILITOT 3.6*   RADIOLOGY:  No results found. ASSESSMENT AND PLAN:  Patient is a 46 year old white male recently diagnosed with the flu now presents with sepsis  * MRSA Bacteremia, Sepsis: present on admission. Due to septic joint - continue IV vancomycin - reviewed fluid c/s  Now with renal failure and need for HD, we may need to wait for final outcome and adjust Abx accordingly.  * Septic Rt knee - Irrigation and debridement s/p arthoscopy on 1/17- reviewed cx result.  * Hyponatremia: 125-> 130> 127 > 130 again - IVFs and monitor  * Influenza: On Tamiflu - will  give total 5 days.  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  nephro to decide further needs.  * Diarrhea Resolved, could be viral  * Diabetes type 2 - continue sliding scale insulin - continue  lantus and monitor  * History of DVT: no longer taking coumadin (his PCP asked him to stop)  * hypotension   Stopped clonidine patch.   Now stable.   All the records are reviewed and case discussed with Care Management/Social Worker. Management plans discussed with the patient, family (girlfriend at bedside), Nursing and they are in agreement.  CODE STATUS: FULL CODE  TOTAL TIME TAKING CARE OF THIS PATIENT: 35 minutes.   More than 50% of the time was spent in counseling/coordination of care: YES  POSSIBLE D/C IN 1-2 DAYS, DEPENDING ON CLINICAL CONDITION.   Altamese Dilling M.D on 10/22/2016 at 5:16 PM  Between 7am to 6pm - Pager - [phone]  After 6pm go to [email or web address] - Social research officer, government  [facility]  Office  [phone]  CC: Primary care physician; Corky Downs, MD  Note: This dictation was prepared with Dragon dictation along with smaller phrase technology. Any transcriptional errors that result from this process are unintentional.

## 2016-10-22 NOTE — Progress Notes (Signed)
[facility]  ROUNDING NOTE   Subjective:   First hemodialysis treatment yesterday. Tolerated treatment well.  Girlfriend at bedside.  Hemodialysis treatment for today  Objective:  Vital signs in last 24 hours:  Temp:  [97.3 F (36.3 C)-98.5 F (36.9 C)] 97.3 F (36.3 C) (01/21 0739) Pulse Rate:  [82-103] 89 (01/21 0739) Resp:  [18-20] 18 (01/21 0739) BP: (101-124)/(52-60) 110/52 (01/21 0739) SpO2:  [96 %-98 %] 97 % (01/21 0739)  Weight change:  Filed Weights   10/17/16 1248 10/17/16 2019 10/21/16 1030  Weight: 88 kg (194 lb) 88.1 kg (194 lb 4.8 oz) 96.5 kg (212 lb 11.9 oz)    Intake/Output: I/O last 3 completed shifts: In: 0  Out: 625 [Urine:125; Other:500]   Intake/Output this shift:  No intake/output data recorded.  Physical Exam: General: NAD, laying in bed  Head: Normocephalic, atraumatic. Moist oral mucosal membranes  Eyes: Anicteric, PERRL  Neck: Supple, trachea midline  Lungs:  Clear to auscultation  Heart: Regular rate and rhythm  Abdomen:  Soft, nontender  Extremities: trace peripheral edema, right knee drain  Neurologic: Nonfocal, moving all four extremities  Skin: No lesions  Access: Left femoral temp HD catheter 1/19 Dr. Gilda CreaseSchnier    Basic Metabolic Panel:  Recent Labs Lab 10/18/16 0011 10/19/16 [id number] 10/20/16 0805 10/21/16 0339 10/21/16 1012 10/22/16 0412 10/22/16 1125  NA 127* 130* 125* 127*  --   --  130*  K 4.7 5.1 5.2* 5.3*  --   --  5.6*  CL 100* 101 97* 99*  --   --  99*  CO2 19* 17* 17* 15*  --   --  15*  GLUCOSE 267* 197* 169* 118*  --   --  188*  BUN 97* 107* 131* 141*  --   --  123*  CREATININE 5.18* 5.59* 6.78* 7.31*  --  6.42* 6.70*  CALCIUM 8.0* 8.4* 8.3* 8.0*  --   --  8.2*  PHOS  --   --   --   --  7.3*  --  7.4*    Liver Function Tests:  Recent Labs Lab 10/15/16 1310 10/17/16 1250 10/22/16 1125  AST 35 61* 22  ALT 16* 22 <5*  ALKPHOS 108 109 94  BILITOT 1.4* 2.3* 3.6*  PROT 6.9 7.3 5.8*  ALBUMIN  2.9* 2.9* 2.0*    Recent Labs Lab 10/15/16 1310 10/17/16 1250  LIPASE 20 24   No results for input(s): AMMONIA in the last 168 hours.  CBC:  Recent Labs Lab 10/17/16 1250 10/18/16 0011 10/20/16 0805 10/21/16 0339 10/21/16 1012  WBC 19.7* 12.6* 9.3 9.0 9.6  NEUTROABS  --   --   --   --  8.0*  HGB 10.5* 8.4* 8.3* 8.3* 8.1*  HCT 32.4* 26.4* 25.7* 25.5* 25.3*  MCV 83.4 83.0 82.3 82.9 83.5  PLT 151 93* 74* 76* 80*    Cardiac Enzymes: No results for input(s): CKTOTAL, CKMB, CKMBINDEX, TROPONINI in the last 168 hours.  BNP: Invalid input(s): POCBNP  CBG:  Recent Labs Lab 10/21/16 1320 10/21/16 1702 10/21/16 2131 10/22/16 0738 10/22/16 1126  GLUCAP 115* 146* 145* 166* 195*    Microbiology: Results for orders placed or performed during the hospital encounter of 10/17/16  Blood culture (routine x 2)     Status: Abnormal   Collection Time: 10/17/16  5:11 PM  Result Value Ref Range Status   Specimen Description BLOOD LEFT HAND  Final   Special Requests   Final    BOTTLES  DRAWN AEROBIC AND ANAEROBIC AER ANA   Culture  Setup Time   Final    GRAM POSITIVE COCCI IN BOTH AEROBIC AND ANAEROBIC BOTTLES CRITICAL RESULT CALLED TO, READ BACK BY AND VERIFIED WITH: CHRISTINE KATSOUDAS 10/18/16 0925 SGD Performed at [facility], [address] [place], [place] [address]    Culture METHICILLIN RESISTANT STAPHYLOCOCCUS AUREUS (A)  Final   Report Status 10/20/2016 FINAL  Final   Organism ID, Bacteria METHICILLIN RESISTANT STAPHYLOCOCCUS AUREUS  Final      Susceptibility   Methicillin resistant staphylococcus aureus - MIC*    CIPROFLOXACIN >=8 RESISTANT Resistant     ERYTHROMYCIN >=8 RESISTANT Resistant     GENTAMICIN <=0.5 SENSITIVE Sensitive     OXACILLIN 2 RESISTANT Resistant     TETRACYCLINE <=1 SENSITIVE Sensitive     VANCOMYCIN <=0.5 SENSITIVE Sensitive     TRIMETH/SULFA <=10 SENSITIVE Sensitive     CLINDAMYCIN <=0.25 SENSITIVE Sensitive     RIFAMPIN  <=0.5 SENSITIVE Sensitive     Inducible Clindamycin NEGATIVE Sensitive     * METHICILLIN RESISTANT STAPHYLOCOCCUS AUREUS  Blood Culture ID Panel (Reflexed)     Status: Abnormal   Collection Time: 10/17/16  5:11 PM  Result Value Ref Range Status   Enterococcus species NOT DETECTED NOT DETECTED Final   Listeria monocytogenes NOT DETECTED NOT DETECTED Final   Staphylococcus species DETECTED (A) NOT DETECTED Final    Comment: CRITICAL RESULT CALLED TO, READ BACK BY AND VERIFIED WITH: CHRISTINE KATSOUDAS 10/18/16 0925 SGD    Staphylococcus aureus DETECTED (A) NOT DETECTED Final    Comment: CRITICAL RESULT CALLED TO, READ BACK BY AND VERIFIED WITH: CHRISTINE KATSOUDAS 10/18/16 0925 SGD    Methicillin resistance DETECTED (A) NOT DETECTED Final    Comment: CRITICAL RESULT CALLED TO, READ BACK BY AND VERIFIED WITH: CHRISTINE KATSOUDAS 10/18/16 0925 SGD    Streptococcus species NOT DETECTED NOT DETECTED Final   Streptococcus agalactiae NOT DETECTED NOT DETECTED Final   Streptococcus pneumoniae NOT DETECTED NOT DETECTED Final   Streptococcus pyogenes NOT DETECTED NOT DETECTED Final   Acinetobacter baumannii NOT DETECTED NOT DETECTED Final   Enterobacteriaceae species NOT DETECTED NOT DETECTED Final   Enterobacter cloacae complex NOT DETECTED NOT DETECTED Final   Escherichia coli NOT DETECTED NOT DETECTED Final   Klebsiella oxytoca NOT DETECTED NOT DETECTED Final   Klebsiella pneumoniae NOT DETECTED NOT DETECTED Final   Proteus species NOT DETECTED NOT DETECTED Final   Serratia marcescens NOT DETECTED NOT DETECTED Final   Haemophilus influenzae NOT DETECTED NOT DETECTED Final   Neisseria meningitidis NOT DETECTED NOT DETECTED Final   Pseudomonas aeruginosa NOT DETECTED NOT DETECTED Final   Candida albicans NOT DETECTED NOT DETECTED Final   Candida glabrata NOT DETECTED NOT DETECTED Final   Candida krusei NOT DETECTED NOT DETECTED Final   Candida parapsilosis NOT DETECTED NOT DETECTED Final    Candida tropicalis NOT DETECTED NOT DETECTED Final  Body fluid culture     Status: None   Collection Time: 10/17/16  6:22 PM  Result Value Ref Range Status   Specimen Description Right Knee  Final   Special Requests NONE  Final   Gram Stain   Final    MODERATE WBC PRESENT, PREDOMINANTLY PMN FEW GRAM POSITIVE COCCI IN PAIRS    Culture   Final    ABUNDANT METHICILLIN RESISTANT STAPHYLOCOCCUS AUREUS NO ANAEROBES ISOLATED Performed at [facility], [address], [place], [place] [address]    Report Status 10/20/2016  FINAL  Final   Organism ID, Bacteria METHICILLIN RESISTANT STAPHYLOCOCCUS AUREUS  Final      Susceptibility   Methicillin resistant staphylococcus aureus - MIC*    CIPROFLOXACIN >=8 RESISTANT Resistant     ERYTHROMYCIN >=8 RESISTANT Resistant     GENTAMICIN <=0.5 SENSITIVE Sensitive     OXACILLIN 2 RESISTANT Resistant     TETRACYCLINE <=1 SENSITIVE Sensitive     VANCOMYCIN <=0.5 SENSITIVE Sensitive     TRIMETH/SULFA <=10 SENSITIVE Sensitive     CLINDAMYCIN <=0.25 SENSITIVE Sensitive     RIFAMPIN <=0.5 SENSITIVE Sensitive     Inducible Clindamycin NEGATIVE Sensitive     * ABUNDANT METHICILLIN RESISTANT STAPHYLOCOCCUS AUREUS  Blood culture (routine x 2)     Status: None   Collection Time: 10/17/16  8:37 PM  Result Value Ref Range Status   Specimen Description BLOOD RIGHT ARM  Final   Special Requests BOTTLES DRAWN AEROBIC AND ANAEROBIC  9CC  Final   Culture NO GROWTH 5 DAYS  Final   Report Status 10/22/2016 FINAL  Final  MRSA PCR Screening     Status: Abnormal   Collection Time: 10/17/16  8:50 PM  Result Value Ref Range Status   MRSA by PCR POSITIVE (A) NEGATIVE Final    Comment:        The GeneXpert MRSA Assay (FDA approved for NASAL specimens only), is one component of a comprehensive MRSA colonization surveillance program. It is not intended to diagnose MRSA infection nor to guide or monitor treatment for MRSA infections. RESULT CALLED TO, READ BACK  BY AND VERIFIED WITH: VIVIAN ALI AT 2242 ON 10/17/2016 JLJ   Culture, blood (single) w Reflex to ID Panel     Status: None (Preliminary result)   Collection Time: 10/18/16 11:36 AM  Result Value Ref Range Status   Specimen Description BLOOD L HAND  Final   Special Requests BOTTLES DRAWN AEROBIC AND ANAEROBIC  Final   Culture NO GROWTH 4 DAYS  Final   Report Status PENDING  Incomplete  Aerobic/Anaerobic Culture (surgical/deep wound)     Status: None (Preliminary result)   Collection Time: 10/18/16  9:18 PM  Result Value Ref Range Status   Specimen Description KNEE  Final   Special Requests NONE  Final   Gram Stain   Final    ABUNDANT WBC PRESENT, PREDOMINANTLY PMN MODERATE GRAM POSITIVE COCCI IN PAIRS IN CLUSTERS Performed at [facility], [address], [place], [place] [address]    Culture   Final    ABUNDANT METHICILLIN RESISTANT STAPHYLOCOCCUS AUREUS NO ANAEROBES ISOLATED; CULTURE IN PROGRESS FOR 5 DAYS    Report Status PENDING  Incomplete   Organism ID, Bacteria METHICILLIN RESISTANT STAPHYLOCOCCUS AUREUS  Final      Susceptibility   Methicillin resistant staphylococcus aureus - MIC*    CIPROFLOXACIN >=8 RESISTANT Resistant     ERYTHROMYCIN >=8 RESISTANT Resistant     GENTAMICIN <=0.5 SENSITIVE Sensitive     OXACILLIN 2 RESISTANT Resistant     TETRACYCLINE <=1 SENSITIVE Sensitive     VANCOMYCIN 1 SENSITIVE Sensitive     TRIMETH/SULFA <=10 SENSITIVE Sensitive     CLINDAMYCIN <=0.25 SENSITIVE Sensitive     RIFAMPIN <=0.5 SENSITIVE Sensitive     Inducible Clindamycin NEGATIVE Sensitive     * ABUNDANT METHICILLIN RESISTANT STAPHYLOCOCCUS AUREUS    Coagulation Studies: No results for input(s): LABPROT, INR in the last 72 hours.  Urinalysis: No results for input(s): COLORURINE, LABSPEC, PHURINE, GLUCOSEU, HGBUR, BILIRUBINUR, KETONESUR,  PROTEINUR, UROBILINOGEN, NITRITE, LEUKOCYTESUR in the last 72 hours.  Invalid input(s): APPERANCEUR    Imaging: No results  found.   Medications:    . amiodarone  200 mg Oral Daily  . aspirin  325 mg Oral Daily  . Chlorhexidine Gluconate Cloth  6 each Topical Q0600  . guaiFENesin  600 mg Oral BID   And  . dextromethorphan  30 mg Oral BID  . feeding supplement (NEPRO CARB STEADY)  237 mL Oral BID BM  . heparin subcutaneous  5,000 Units Subcutaneous Q8H  . insulin aspart  0-9 Units Subcutaneous TID PC & HS  . methimazole  10 mg Oral Daily  . metoprolol  50 mg Oral Daily  . mupirocin ointment  1 application Nasal BID  . pantoprazole  40 mg Oral Daily  . polyethylene glycol  17 g Oral Daily  . senna  1 tablet Oral BID   acetaminophen **OR** acetaminophen, bisacodyl, HYDROcodone-acetaminophen, metoCLOPramide **OR** metoCLOPramide (REGLAN) injection, morphine injection, ondansetron **OR** ondansetron (ZOFRAN) IV  Assessment/ Plan:  [name] is a 46 y.o. white male with past medical history of hypertension, insulin dependent diabetes mellitus type 2, diabetic peripheral neuropathy  1. 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 Chronic kidney disease secondary to diabetic nephropathy Acute renal failure from sepsis - source MRSA from septic joint. Concern that with progressive disease patient may be at end stage renal disease. Will monitor renal function on a daily basis - Dialysis today and then again tomorrow.  -  if needs dialysis further: will plan for tunneled dialysis catheter on Tuesday.  - Discussed with family and patient about dialysis. If outpatient dialysis required, plan on [name]   2. Hypertension: currently with hypotension from sepsis - clonidine and metoprolol  3. Anemia of chronic kidney disease: Hemoglobin 8.1 - epo with dialysis treatment.   4. Secondary Hyperparathyroidism: PTH pending. With  hyperphosphatemia - not eating much currently, not currently on a binder.    LOS: 5 Juan Roberson 1/21/20181:09 PM

## 2016-10-22 NOTE — Progress Notes (Signed)
POST DIALYSIS ASSESSMENT 

## 2016-10-22 NOTE — Progress Notes (Signed)
HD COMPLETED  

## 2016-10-23 LAB — BASIC METABOLIC PANEL
Anion gap: 14 (ref 5–15)
BUN: 90 mg/dL — AB (ref 6–20)
CALCIUM: 8.2 mg/dL — AB (ref 8.9–10.3)
CHLORIDE: 101 mmol/L (ref 101–111)
CO2: 19 mmol/L — AB (ref 22–32)
CREATININE: 4.93 mg/dL — AB (ref 0.61–1.24)
GFR calc Af Amer: 15 mL/min — ABNORMAL LOW (ref >60)
GFR calc non Af Amer: 13 mL/min — ABNORMAL LOW (ref >60)
GLUCOSE: 168 mg/dL — AB (ref 65–99)
Potassium: 4.7 mmol/L (ref 3.5–5.1)
Sodium: 134 mmol/L — ABNORMAL LOW (ref 135–145)

## 2016-10-23 LAB — CBC
HCT: 24.2 % — ABNORMAL LOW (ref 40.0–52.0)
Hemoglobin: 8.2 g/dL — ABNORMAL LOW (ref 13.0–18.0)
MCH: 27.1 pg (ref 26.0–34.0)
MCHC: 33.8 g/dL (ref 32.0–36.0)
MCV: 80.3 fL (ref 80.0–100.0)
PLATELETS: 86 10*3/uL — AB (ref 150–440)
RBC: 3.01 MIL/uL — AB (ref 4.40–5.90)
RDW: 16 % — ABNORMAL HIGH (ref 11.5–14.5)
WBC: 9.3 10*3/uL (ref 3.8–10.6)

## 2016-10-23 LAB — GLUCOSE, CAPILLARY
GLUCOSE-CAPILLARY: 180 mg/dL — AB (ref 65–99)
GLUCOSE-CAPILLARY: 140 mg/dL — AB (ref 65–99)
GLUCOSE-CAPILLARY: 154 mg/dL — AB (ref 65–99)
Glucose-Capillary: 141 mg/dL — ABNORMAL HIGH (ref 65–99)

## 2016-10-23 LAB — CULTURE, BLOOD (SINGLE): CULTURE: NO GROWTH

## 2016-10-23 MED ORDER — HYDROCODONE-ACETAMINOPHEN 7.5-325 MG PO TABS
1.0000 | ORAL_TABLET | ORAL | Status: DC | PRN
  Administered 2016-10-25 – 2016-10-27 (×4): 1 via ORAL
  Filled 2016-10-23 (×4): qty 1

## 2016-10-23 MED ORDER — VANCOMYCIN HCL IN DEXTROSE 1-5 GM/200ML-% IV SOLN
1000.0000 mg | Freq: Once | INTRAVENOUS | Status: AC
  Administered 2016-10-23: 1000 mg via INTRAVENOUS
  Filled 2016-10-23: qty 200

## 2016-10-23 MED ORDER — TUBERCULIN PPD 5 UNIT/0.1ML ID SOLN
5.0000 [IU] | Freq: Once | INTRADERMAL | Status: AC
  Administered 2016-10-23: 5 [IU] via INTRADERMAL
  Filled 2016-10-23: qty 0.1

## 2016-10-23 NOTE — Progress Notes (Signed)
[facility] - [place] [facility]   PATIENT NAME: Juan Roberson    MR#:  [id number]  DATE OF BIRTH:  1971-06-11  SUBJECTIVE:  CHIEF COMPLAINT:   Chief Complaint  Patient presents with  . Influenza  . Abdominal Pain  . Knee Pain  still having lots of knee pain and requesting pain meds, unable to bear weight still. BP is running low, and have gradually worsening renal function.  pt also appears some lethargic today.   he had temporary catheter and started HD . REVIEW OF SYSTEMS:  Review of Systems  Constitutional: Negative for chills, fever and weight loss.  HENT: Negative for nosebleeds and sore throat.   Eyes: Negative for blurred vision.  Respiratory: Negative for cough, shortness of breath and wheezing.   Cardiovascular: Negative for chest pain, orthopnea, leg swelling and PND.  Gastrointestinal: Negative for abdominal pain, constipation, diarrhea, heartburn, nausea and vomiting.  Genitourinary: Negative for dysuria and urgency.  Musculoskeletal: Positive for joint pain. Negative for back pain.  Skin: Negative for rash.  Neurological: Negative for dizziness, speech change, focal weakness and headaches.  Endo/Heme/Allergies: Does not bruise/bleed easily.  Psychiatric/Behavioral: Negative for depression.    DRUG ALLERGIES:   Allergies  Allergen Reactions  . Sulfur Other (See Comments)    Pt states that this medication causes renal failure.     VITALS:  Blood pressure 116/66, pulse 90, temperature 98 F (36.7 C), temperature source Oral, resp. rate 18, height 6\' 2"  (1.88 m), weight 91.9 kg (202 lb 9.6 oz), SpO2 97 %. PHYSICAL EXAMINATION:  Physical Exam  Constitutional: He is oriented to person, place, and time and well-developed, well-nourished, and in no distress.  HENT:  Head: Normocephalic and atraumatic.  Eyes: Conjunctivae and EOM are normal. Pupils are equal, round, and reactive to light.  Neck: Normal range of motion. Neck supple. No  tracheal deviation present. No thyromegaly present.  Cardiovascular: Normal rate, regular rhythm and normal heart sounds.   Pulmonary/Chest: Effort normal and breath sounds normal. No respiratory distress. He has no wheezes. He exhibits no tenderness.  Abdominal: Soft. Bowel sounds are normal. He exhibits no distension. There is no tenderness.  Musculoskeletal:       Right knee: He exhibits decreased range of motion and effusion. Tenderness found.  Neurological: He is alert and oriented to person, place, and time. No cranial nerve deficit.  Skin: Skin is warm and dry. No rash noted.  Psychiatric: Mood and affect normal.   LABORATORY PANEL:   CBC  Recent Labs Lab 10/23/16 0414  WBC 9.3  HGB 8.2*  HCT 24.2*  PLT 86*   ------------------------------------------------------------------------------------------------------------------ Chemistries   Recent Labs Lab 10/22/16 1125 10/23/16 0414  NA 130* 134*  K 5.6* 4.7  CL 99* 101  CO2 15* 19*  GLUCOSE 188* 168*  BUN 123* 90*  CREATININE 6.70* 4.93*  CALCIUM 8.2* 8.2*  AST 22  --   ALT <5*  --   ALKPHOS 94  --   BILITOT 3.6*  --    RADIOLOGY:  No results found. ASSESSMENT AND PLAN:  Patient is a 46 year old white male recently diagnosed with the flu now presents with sepsis  * MRSA Bacteremia, Sepsis: present on admission. Due to septic joint - continue IV vancomycin - reviewed fluid c/s  Now with renal failure and need for HD, we may need to wait for final outcome and adjust Abx accordingly.  likely will need HD as out pt.  * Septic Rt knee -  Irrigation and debridement s/p arthoscopy on 1/17- reviewed cx result.  * Hyponatremia: 125-> 130> 127 > 130 >134 - IVFs and monitor  * Influenza: On Tamiflu - will give total 5 days.  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  nephro to decide further needs.  *  Diarrhea Resolved, could be viral  * Diabetes type 2 - continue sliding scale insulin - continue lantus and monitor  * History of DVT: no longer taking coumadin (his PCP asked him to stop)  * hypotension   Stopped clonidine patch.   Now stable.   All the records are reviewed and case discussed with Care Management/Social Worker. Management plans discussed with the patient, family (girlfriend at bedside), Nursing and they are in agreement.  CODE STATUS: FULL CODE  TOTAL TIME TAKING CARE OF THIS PATIENT: 35 minutes.   More than 50% of the time was spent in counseling/coordination of care: YES  POSSIBLE D/C IN 1-2 DAYS, DEPENDING ON CLINICAL CONDITION.   Juan Roberson, Juan Roberson M.D on 10/23/2016 at 9:12 PM  Between 7am to 6pm - Pager - [phone]  After 6pm go to [email or web address] - Social research officer, governmentpassword [email or web address] [email or web address]  [facility] [place] [facility]  Office  [phone]  CC: Primary care physician; Juan DownsMASOUD,JAVED, MD  Note: This dictation was prepared with Dragon dictation along with smaller phrase technology. Any transcriptional errors that result from this process are unintentional.

## 2016-10-23 NOTE — Consult Note (Signed)
Pharmacy Antibiotic Note  Juan BlossomWaltzie L Fischetti Jr. is a 46 y.o. male admitted on 10/17/2016 with MRSA bacteremia, septic knee, possible PNA.  Pharmacy has been consulted for vancomycin and zosyn dosing. Zosyn d/c 10/20/16.  Plan: Pt renal function continues to decline- Plans for HD. If pt receives a full or close to full HD session will need to give 1g at the end of the session. If only partial, will consider a smaller supplemental dose.  Pharmacy will continue to follow pt for renal replacement plans and vanc dosing.  1/20 0314 dialysis catheter placed, plan for HD in AM. Ordered random vancomycin level for now to assess need for vancomycin dose before HD today.   1/20 0339 vanc random 27 mcg/mL. Patient does not need additional dose at this time - resume dosing post HD.   1/20- HD to begin today. Ordered Vancomycin 1 gram IV to be given at end of HD. Follow daily for HD/Vancomycin dosing until HD schedule determined.  1/21- HD again today. Will order Vanc 1 gram x 1. Possible HD again tomorrow. Nephrology assessing daily for HD need. Follow daily and order Vancomycin until schedule is determined.  1/22 HD again today. According to nurse they have been doing full HD- will order vancomycin 1gm IV x1 now. Vanc random level ordered tomorrow AM. Will continue to follow daily and order Vanc as needed. Cultures and sensitivities are showing Sensitive to Clindamycin- will recommend switching to Clindamycin since no renal dose adjustment is needed.     Height: 6\' 2"  (188 cm) Weight: 202 lb 9.6 oz (91.9 kg) IBW/kg (Calculated) : 82.2  Temp (24hrs), Avg:98.2 F (36.8 C), Min:97.6 F (36.4 C), Max:98.9 F (37.2 C)   Recent Labs Lab 10/17/16 1712 10/17/16 2045 10/18/16 0011  10/20/16 0805 10/21/16 0339 10/21/16 1012 10/22/16 0412 10/22/16 1125 10/23/16 0414  WBC  --   --  12.6*  --  9.3 9.0 9.6  --   --  9.3  CREATININE  --   --  5.18*  < > 6.78* 7.31*  --  6.42* 6.70* 4.93*  LATICACIDVEN  2.8* 2.2* 1.5  --   --   --   --   --   --   --   VANCORANDOM  --   --   --   --   --  27  --   --   --   --   < > = values in this interval not displayed.  Estimated Creatinine Clearance: 21.5 mL/min (by C-G formula based on SCr of 4.93 mg/dL (H)).    Allergies  Allergen Reactions  . Sulfur Other (See Comments)    Pt states that this medication causes renal failure.      Antimicrobials this admission: vancomycin 1/16 >>  zosyn 1/16 >> 1/19  Dose adjustments this admission:   Microbiology results: 1/16 BCx: MRSA 2/4 so far 1/16 MRSA PCR: positive Joint fluid: few gram positive cocci in pair  Thank you for allowing pharmacy to be a part of this patient's care.  Gardner CandleSheema M Lavena Loretto, PharmD, BCPS Clinical Pharmacist 10/23/2016 11:41 AM

## 2016-10-23 NOTE — Progress Notes (Signed)
RN administered PPD on right lower forearm at 1645. Patient tolerated well.  Harvie HeckMelanie Xsavier Seeley, RN

## 2016-10-23 NOTE — Progress Notes (Signed)
Physical Therapy Treatment Patient Details Name: Juan Roberson. MRN: [id number] DOB: September 04, 1971 Today's Date: 10/23/2016    History of Present Illness Patient is a 46 y/o male that presents s/p I&D of R knee with underlying pneumonia and recent flu as well. Currently undergoing HD treatments    PT Comments    Pt awake but appeared lethargic and stated he felt really weak but willing to participate in PT treatment and follow commands. Pt limited to supine therex due to precautions associated with L femoral catheter. Pt able to perform UE exercises with min guarding and arms would drop several times during therex due to fatigue and medical status. Pt required mod assistance and constant cuing to perform LE's therex in supine position. Pt displayed a decrease in function from previous treatments due to fatigue and precautions associated with dialysis treatments. Pt has generalized weakness that currently limit functional mobility and will continue to benefit from skilled Pt.     Follow Up Recommendations  SNF     Equipment Recommendations  Rolling walker with 5" wheels    Recommendations for Other Services       Precautions / Restrictions Precautions Precautions: Fall Precaution Comments: L Femoral artery catheter-  Restrictions Weight Bearing Restrictions: Yes RLE Weight Bearing: Partial weight bearing RLE Partial Weight Bearing Percentage or Pounds: 50% Other Position/Activity Restrictions: No L active or passive L Hip movements    Mobility  Bed Mobility Overal bed mobility: Needs Assistance             General bed mobility comments: Bed mobility deferred due to femoral catheter in L LE  Transfers                 General transfer comment: not assessed due to L LE femoral   Ambulation/Gait             General Gait Details: did not attempt    Stairs            Wheelchair Mobility    Modified Rankin (Stroke Patients Only)       Balance        Sitting balance - Comments: did not attempt due to precautions w/ L femoral catheter        Standing balance comment: did not attempt due to precautions w/ L femoral catheter                     Cognition Arousal/Alertness: Lethargic Behavior During Therapy: WFL for tasks assessed/performed Overall Cognitive Status: Within Functional Limits for tasks assessed                      Exercises General Exercises - Upper Extremity Shoulder Flexion: AROM;10 reps;Supine;Both;Strengthening (to improve UE strength and endurance ) Shoulder Extension: AROM;10 reps;Strengthening;Both;Supine (isometric) Shoulder ABduction: AROM;Strengthening;10 reps;Both;Supine Shoulder Horizontal ABduction: AROM;Both;10 reps;Strengthening;Supine Elbow Flexion: AROM;Both;Strengthening;Supine Other Exercises Other Exercises: Supine therex R LE; AAROM 1x15, to improve strength, endurance, and ROM. Ankle pumps, quad sets, SLR, heel slides, hip abd; required mod assist 50% to complete Other Exercises: L LE therex; supine AAROM; 1x15 to improve strength; ankle pumps; quad sets     General Comments        Pertinent Vitals/Pain Pain Assessment: No/denies pain (stated no pain but feels weak)    Home Living                      Prior Function  PT Goals (current goals can now be found in the care plan section) Acute Rehab PT Goals Patient Stated Goal: To return home  PT Goal Formulation: With patient Time For Goal Achievement: 11/02/16 Potential to Achieve Goals: Fair Progress towards PT goals: Not progressing toward goals - comment (displays decrease in strength due to diallysis treatments)    Frequency    7X/week (pt unable to tolerate twice a day due to precautions and dialiysis treatments)      PT Plan Frequency needs to be updated    Co-evaluation             End of Session   Activity Tolerance: Patient limited by lethargy;Patient limited by  fatigue Patient left: in bed;with bed alarm set;with family/visitor present;with call bell/phone within reach     Time: 0910-0929 PT Time Calculation (min) (ACUTE ONLY): 19 min  Charges:                       G Codes:      Juan Roberson 10/23/2016, 10:35 AM

## 2016-10-23 NOTE — Anesthesia Postprocedure Evaluation (Signed)
Anesthesia Post Note  Patient: Juan BlossomWaltzie L Olivarez Jr.  Procedure(s) Performed: Procedure(s) (LRB): IRRIGATION AND DEBRIDEMENT KNEE (Right) ARTHROSCOPY KNEE  Patient location during evaluation: PACU Anesthesia Type: General Level of consciousness: awake and alert Pain management: pain level controlled Vital Signs Assessment: post-procedure vital signs reviewed and stable Respiratory status: spontaneous breathing, nonlabored ventilation, respiratory function stable and patient connected to nasal cannula oxygen Cardiovascular status: blood pressure returned to baseline and stable Postop Assessment: no signs of nausea or vomiting Anesthetic complications: no     Last Vitals:  Vitals:   10/23/16 1340 10/23/16 1356  BP: 120/60 116/65  Pulse: 84 83  Resp: 18 18  Temp:      Last Pain:  Vitals:   10/23/16 1040  TempSrc: Oral  PainSc: 0-No pain                 Lenard SimmerAndrew Kinnedy Mongiello

## 2016-10-23 NOTE — Progress Notes (Signed)
PRE DIALYSIS ASSESSMENT 

## 2016-10-23 NOTE — Progress Notes (Signed)
SLP Cancellation Note  Patient Details Name: Juan BlossomWaltzie L Hladik Jr. MRN: [id number] DOB: 04/04/1971   Cancelled treatment:       Reason Eval/Treat Not Completed: Patient at procedure or test/unavailable (receiving Dialysis currently. ST will f/u w/ assessment.) NSG agreed. Chart reviewed.    Jerilynn SomKatherine Watson, MS, CCC-SLP Watson,Katherine 10/23/2016, 2:54 PM

## 2016-10-23 NOTE — Care Management (Addendum)
Spoke with daughter Gren[place]Brittany [phone]. She states she will provide transportation to and from dialysis if patient is able to ride in a car. [facility] [name] I have updated Susann Givensheryl Brawner.   Received call from CSW stating that per progression report this AM patient was a new dialysis patient. I have requested PPD to be placed. Hepatitis lab results still pending. I have faxed the follow to Susann Givensheryl Brawner:  Patient Relations Liaison  [facility] Cell [phone] [place] [phone]   Facesheet with SSN--included H&P- included Chest x ray- included EKG- included Nephrologist Consult 10/20/2016 Nephrologist Progress Note 10/20/2016-10/21/16 Labs -Included Hep B Ag/AB-Sent 10/21/16- RESULTS NOT BACK Dialysis Flow sheet -included First date of dialysis. -10/21/2016 PPD ordered to be placed 10/23/16-order requested Name of Nephrologist following the patient --- DR Lamont DowdySarath Kolluru  [facility]-- [name]

## 2016-10-23 NOTE — Progress Notes (Signed)
HD STARTED  

## 2016-10-23 NOTE — Progress Notes (Signed)
Post dialysis assessment 

## 2016-10-23 NOTE — Progress Notes (Signed)
[facility]  ROUNDING NOTE   Subjective:  Tolerated dialysis well today 3 hours treatment Family and bedside report that he feels better after treatment Appetite is still very poor No shortness of breath   Objective:  Vital signs in last 24 hours:  Temp:  [97.8 F (36.6 C)-99 F (37.2 C)] 99 F (37.2 C) (01/22 1356) Pulse Rate:  [82-105] 83 (01/22 1356) Resp:  [16-18] 18 (01/22 1356) BP: (100-127)/(47-80) 116/65 (01/22 1356) SpO2:  [98 %-99 %] 98 % (01/22 1040) Weight:  [91.9 kg (202 lb 9.6 oz)-93.3 kg (205 lb 11 oz)] 91.9 kg (202 lb 9.6 oz) (01/22 1040)  Weight change: -2.9 kg (-6 lb 6.3 oz) Filed Weights   10/22/16 1530 10/22/16 1826 10/23/16 1040  Weight: 93.6 kg (206 lb 5.6 oz) 93.3 kg (205 lb 11 oz) 91.9 kg (202 lb 9.6 oz)    Intake/Output: I/O last 3 completed shifts: In: 500 [P.O.:300; IV Piggyback:200] Out: 300 [Other:300]   Intake/Output this shift:  Total I/O In: -  Out: 500 [Other:500]  Physical Exam: General: NAD, laying in bed  Head: Normocephalic, atraumatic. Moist oral mucosal membranes. Temporal wasting  Eyes: Anicteric,    Neck: Supple, trachea midline  Lungs:  Clear to auscultation, barrell chest  Heart: Regular rate and rhythm  Abdomen:  Soft, nontender  Extremities: trace peripheral edema, right knee drain  Neurologic: Nonfocal, moving all four extremities  Skin: No lesions, multiple tattoos  Access: Left femoral temp HD catheter 1/19 Dr. Gilda CreaseSchnier    Basic Metabolic Panel:  Recent Labs Lab 10/19/16 0453 10/20/16 0805 10/21/16 [id number] 10/21/16 1012 10/22/16 0412 10/22/16 1125 10/23/16 0414  NA 130* 125* 127*  --   --  130* 134*  K 5.1 5.2* 5.3*  --   --  5.6* 4.7  CL 101 97* 99*  --   --  99* 101  CO2 17* 17* 15*  --   --  15* 19*  GLUCOSE 197* 169* 118*  --   --  188* 168*  BUN 107* 131* 141*  --   --  123* 90*  CREATININE 5.59* 6.78* 7.31*  --  6.42* 6.70* 4.93*  CALCIUM 8.4* 8.3* 8.0*  --   --  8.2* 8.2*  PHOS  --    --   --  7.3*  --  7.4*  --     Liver Function Tests:  Recent Labs Lab 10/17/16 1250 10/22/16 1125  AST 61* 22  ALT 22 <5*  ALKPHOS 109 94  BILITOT 2.3* 3.6*  PROT 7.3 5.8*  ALBUMIN 2.9* 2.0*    Recent Labs Lab 10/17/16 1250  LIPASE 24   No results for input(s): AMMONIA in the last 168 hours.  CBC:  Recent Labs Lab 10/18/16 0011 10/20/16 0805 10/21/16 0339 10/21/16 1012 10/23/16 0414  WBC 12.6* 9.3 9.0 9.6 9.3  NEUTROABS  --   --   --  8.0*  --   HGB 8.4* 8.3* 8.3* 8.1* 8.2*  HCT 26.4* 25.7* 25.5* 25.3* 24.2*  MCV 83.0 82.3 82.9 83.5 80.3  PLT 93* 74* 76* 80* 86*    Cardiac Enzymes: No results for input(s): CKTOTAL, CKMB, CKMBINDEX, TROPONINI in the last 168 hours.  BNP: Invalid input(s): POCBNP  CBG:  Recent Labs Lab 10/22/16 1718 10/22/16 2201 10/23/16 0816 10/23/16 1239 10/23/16 1623  GLUCAP 172* 160* 180* 140* 141*    Microbiology: Results for orders placed or performed during the hospital encounter of 10/17/16  Blood culture (routine x 2)  Status: Abnormal   Collection Time: 10/17/16  5:11 PM  Result Value Ref Range Status   Specimen Description BLOOD LEFT HAND  Final   Special Requests   Final    BOTTLES DRAWN AEROBIC AND ANAEROBIC AER ANA   Culture  Setup Time   Final    GRAM POSITIVE COCCI IN BOTH AEROBIC AND ANAEROBIC BOTTLES CRITICAL RESULT CALLED TO, READ BACK BY AND VERIFIED WITH: CHRISTINE KATSOUDAS 10/18/16 1610 SGD Performed at [facility], [address], [place], [place] [address]    Culture METHICILLIN RESISTANT STAPHYLOCOCCUS AUREUS (A)  Final   Report Status 10/20/2016 FINAL  Final   Organism ID, Bacteria METHICILLIN RESISTANT STAPHYLOCOCCUS AUREUS  Final      Susceptibility   Methicillin resistant staphylococcus aureus - MIC*    CIPROFLOXACIN >=8 RESISTANT Resistant     ERYTHROMYCIN >=8 RESISTANT Resistant     GENTAMICIN <=0.5 SENSITIVE Sensitive     OXACILLIN 2 RESISTANT Resistant      TETRACYCLINE <=1 SENSITIVE Sensitive     VANCOMYCIN <=0.5 SENSITIVE Sensitive     TRIMETH/SULFA <=10 SENSITIVE Sensitive     CLINDAMYCIN <=0.25 SENSITIVE Sensitive     RIFAMPIN <=0.5 SENSITIVE Sensitive     Inducible Clindamycin NEGATIVE Sensitive     * METHICILLIN RESISTANT STAPHYLOCOCCUS AUREUS  Blood Culture ID Panel (Reflexed)     Status: Abnormal   Collection Time: 10/17/16  5:11 PM  Result Value Ref Range Status   Enterococcus species NOT DETECTED NOT DETECTED Final   Listeria monocytogenes NOT DETECTED NOT DETECTED Final   Staphylococcus species DETECTED (A) NOT DETECTED Final    Comment: CRITICAL RESULT CALLED TO, READ BACK BY AND VERIFIED WITH: CHRISTINE KATSOUDAS 10/18/16 0925 SGD    Staphylococcus aureus DETECTED (A) NOT DETECTED Final    Comment: CRITICAL RESULT CALLED TO, READ BACK BY AND VERIFIED WITH: CHRISTINE KATSOUDAS 10/18/16 0925 SGD    Methicillin resistance DETECTED (A) NOT DETECTED Final    Comment: CRITICAL RESULT CALLED TO, READ BACK BY AND VERIFIED WITH: CHRISTINE KATSOUDAS 10/18/16 0925 SGD    Streptococcus species NOT DETECTED NOT DETECTED Final   Streptococcus agalactiae NOT DETECTED NOT DETECTED Final   Streptococcus pneumoniae NOT DETECTED NOT DETECTED Final   Streptococcus pyogenes NOT DETECTED NOT DETECTED Final   Acinetobacter baumannii NOT DETECTED NOT DETECTED Final   Enterobacteriaceae species NOT DETECTED NOT DETECTED Final   Enterobacter cloacae complex NOT DETECTED NOT DETECTED Final   Escherichia coli NOT DETECTED NOT DETECTED Final   Klebsiella oxytoca NOT DETECTED NOT DETECTED Final   Klebsiella pneumoniae NOT DETECTED NOT DETECTED Final   Proteus species NOT DETECTED NOT DETECTED Final   Serratia marcescens NOT DETECTED NOT DETECTED Final   Haemophilus influenzae NOT DETECTED NOT DETECTED Final   Neisseria meningitidis NOT DETECTED NOT DETECTED Final   Pseudomonas aeruginosa NOT DETECTED NOT DETECTED Final   Candida albicans NOT DETECTED  NOT DETECTED Final   Candida glabrata NOT DETECTED NOT DETECTED Final   Candida krusei NOT DETECTED NOT DETECTED Final   Candida parapsilosis NOT DETECTED NOT DETECTED Final   Candida tropicalis NOT DETECTED NOT DETECTED Final  Body fluid culture     Status: None   Collection Time: 10/17/16  6:22 PM  Result Value Ref Range Status   Specimen Description Right Knee  Final   Special Requests NONE  Final   Gram Stain   Final    MODERATE WBC PRESENT, PREDOMINANTLY PMN FEW GRAM POSITIVE COCCI IN PAIRS  Culture   Final    ABUNDANT METHICILLIN RESISTANT STAPHYLOCOCCUS AUREUS NO ANAEROBES ISOLATED Performed at [facility], [address], [place], [place] [address]    Report Status 10/20/2016 FINAL  Final   Organism ID, Bacteria METHICILLIN RESISTANT STAPHYLOCOCCUS AUREUS  Final      Susceptibility   Methicillin resistant staphylococcus aureus - MIC*    CIPROFLOXACIN >=8 RESISTANT Resistant     ERYTHROMYCIN >=8 RESISTANT Resistant     GENTAMICIN <=0.5 SENSITIVE Sensitive     OXACILLIN 2 RESISTANT Resistant     TETRACYCLINE <=1 SENSITIVE Sensitive     VANCOMYCIN <=0.5 SENSITIVE Sensitive     TRIMETH/SULFA <=10 SENSITIVE Sensitive     CLINDAMYCIN <=0.25 SENSITIVE Sensitive     RIFAMPIN <=0.5 SENSITIVE Sensitive     Inducible Clindamycin NEGATIVE Sensitive     * ABUNDANT METHICILLIN RESISTANT STAPHYLOCOCCUS AUREUS  Blood culture (routine x 2)     Status: None   Collection Time: 10/17/16  8:37 PM  Result Value Ref Range Status   Specimen Description BLOOD RIGHT ARM  Final   Special Requests BOTTLES DRAWN AEROBIC AND ANAEROBIC  9CC  Final   Culture NO GROWTH 5 DAYS  Final   Report Status 10/22/2016 FINAL  Final  MRSA PCR Screening     Status: Abnormal   Collection Time: 10/17/16  8:50 PM  Result Value Ref Range Status   MRSA by PCR POSITIVE (A) NEGATIVE Final    Comment:        The GeneXpert MRSA Assay (FDA approved for NASAL specimens only), is one component of  a comprehensive MRSA colonization surveillance program. It is not intended to diagnose MRSA infection nor to guide or monitor treatment for MRSA infections. RESULT CALLED TO, READ BACK BY AND VERIFIED WITH: VIVIAN ALI AT 2242 ON 10/17/2016 JLJ   Culture, blood (single) w Reflex to ID Panel     Status: None   Collection Time: 10/18/16 11:36 AM  Result Value Ref Range Status   Specimen Description BLOOD L HAND  Final   Special Requests BOTTLES DRAWN AEROBIC AND ANAEROBIC  Final   Culture NO GROWTH 5 DAYS  Final   Report Status 10/23/2016 FINAL  Final  Aerobic/Anaerobic Culture (surgical/deep wound)     Status: None (Preliminary result)   Collection Time: 10/18/16  9:18 PM  Result Value Ref Range Status   Specimen Description KNEE  Final   Special Requests NONE  Final   Gram Stain   Final    ABUNDANT WBC PRESENT, PREDOMINANTLY PMN MODERATE GRAM POSITIVE COCCI IN PAIRS IN CLUSTERS Performed at [facility], [address], [place], [place] [address]    Culture   Final    ABUNDANT METHICILLIN RESISTANT STAPHYLOCOCCUS AUREUS NO ANAEROBES ISOLATED; CULTURE IN PROGRESS FOR 5 DAYS    Report Status PENDING  Incomplete   Organism ID, Bacteria METHICILLIN RESISTANT STAPHYLOCOCCUS AUREUS  Final      Susceptibility   Methicillin resistant staphylococcus aureus - MIC*    CIPROFLOXACIN >=8 RESISTANT Resistant     ERYTHROMYCIN >=8 RESISTANT Resistant     GENTAMICIN <=0.5 SENSITIVE Sensitive     OXACILLIN 2 RESISTANT Resistant     TETRACYCLINE <=1 SENSITIVE Sensitive     VANCOMYCIN 1 SENSITIVE Sensitive     TRIMETH/SULFA <=10 SENSITIVE Sensitive     CLINDAMYCIN <=0.25 SENSITIVE Sensitive     RIFAMPIN <=0.5 SENSITIVE Sensitive     Inducible Clindamycin NEGATIVE Sensitive     * ABUNDANT  METHICILLIN RESISTANT STAPHYLOCOCCUS AUREUS    Coagulation Studies: No results for input(s): LABPROT, INR in the last 72 hours.  Urinalysis: No results for input(s): COLORURINE, LABSPEC,  PHURINE, GLUCOSEU, HGBUR, BILIRUBINUR, KETONESUR, PROTEINUR, UROBILINOGEN, NITRITE, LEUKOCYTESUR in the last 72 hours.  Invalid input(s): APPERANCEUR    Imaging: No results found.   Medications:    . amiodarone  200 mg Oral Daily  . aspirin  325 mg Oral Daily  . guaiFENesin  600 mg Oral BID   And  . dextromethorphan  30 mg Oral BID  . feeding supplement (NEPRO CARB STEADY)  237 mL Oral BID BM  . heparin subcutaneous  5,000 Units Subcutaneous Q8H  . insulin aspart  0-9 Units Subcutaneous TID PC & HS  . methimazole  10 mg Oral Daily  . metoprolol  50 mg Oral Daily  . mupirocin ointment  1 application Nasal BID  . pantoprazole  40 mg Oral Daily  . polyethylene glycol  17 g Oral Daily  . senna  1 tablet Oral BID  . tuberculin  5 Units Intradermal Once   acetaminophen **OR** acetaminophen, bisacodyl, HYDROcodone-acetaminophen, metoCLOPramide **OR** metoCLOPramide (REGLAN) injection, morphine injection, ondansetron **OR** ondansetron (ZOFRAN) IV  Assessment/ Plan:  [name] is a 46 y.o. white male with past medical history of hypertension, insulin dependent diabetes mellitus type 2, diabetic peripheral neuropathy  1. 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   Acute renal failure from sepsis - source MRSA from septic joint.  - ARF dialysis for 4-6 weeks.  - Discussed with family and patient about outpatient dialysis. Plan for outpatient dialysis at [facility] [address]  Appetite poor Liberalize diet for now  2. Hypertension: currently with hypotension from sepsis - clonidine and metoprolol  3. MRSA Knee infection Treated with iv vancomycin  4. DM-2 with CKD Chronic kidney disease secondary to diabetic nephropathy    LOS: 6 Juan Roberson 1/22/20186:05 PM

## 2016-10-23 NOTE — Progress Notes (Signed)
HD COMPLETED  

## 2016-10-23 NOTE — Progress Notes (Signed)
Per RN in progression rounds patient will need outpatient dialysis set up. RN case manager aware of above. [facility].   [employer]Bailey Mamie Diiorio, LCSW [phone]

## 2016-10-24 LAB — CREATININE, SERUM
Creatinine, Ser: 3.56 mg/dL — ABNORMAL HIGH (ref 0.61–1.24)
GFR calc Af Amer: 22 mL/min — ABNORMAL LOW (ref >60)
GFR, EST NON AFRICAN AMERICAN: 19 mL/min — AB (ref >60)

## 2016-10-24 LAB — AEROBIC/ANAEROBIC CULTURE (SURGICAL/DEEP WOUND)

## 2016-10-24 LAB — GLUCOSE, CAPILLARY
Glucose-Capillary: 186 mg/dL — ABNORMAL HIGH (ref 65–99)
Glucose-Capillary: 250 mg/dL — ABNORMAL HIGH (ref 65–99)
Glucose-Capillary: 315 mg/dL — ABNORMAL HIGH (ref 65–99)
Glucose-Capillary: 346 mg/dL — ABNORMAL HIGH (ref 65–99)

## 2016-10-24 LAB — TROPONIN I: TROPONIN I: 1.74 ng/mL — AB (ref <0.03)

## 2016-10-24 LAB — AEROBIC/ANAEROBIC CULTURE W GRAM STAIN (SURGICAL/DEEP WOUND)

## 2016-10-24 LAB — VANCOMYCIN, RANDOM: Vancomycin Rm: 23

## 2016-10-24 LAB — PARATHYROID HORMONE, INTACT (NO CA): PTH: 38 pg/mL (ref 15–65)

## 2016-10-24 NOTE — Progress Notes (Signed)
Subjective: 6 Days Post-Op Procedure(s) (LRB): IRRIGATION AND DEBRIDEMENT KNEE (Right) ARTHROSCOPY KNEE    Patient reports pain as mild. Knee pain much better.  Started ROM with PT as well.  Wounds benign and very little swelling in knee.  Objective:   VITALS:   Vitals:   10/24/16 0356 10/24/16 0820  BP: 124/66 130/70  Pulse: 98 86  Resp: 17 18  Temp: 98.6 F (37 C) 97.6 F (36.4 C)    Neurologically intact ABD soft Neurovascular intact Sensation intact distally Intact pulses distally Dorsiflexion/Plantar flexion intact Incision: no drainage  LABS  Recent Labs  10/23/16 0414  HGB 8.2*  HCT 24.2*  WBC 9.3  PLT 86*     Recent Labs  10/22/16 1125 10/23/16 0414 10/24/16 0455  NA 130* 134*  --   K 5.6* 4.7  --   BUN 123* 90*  --   CREATININE 6.70* 4.93* 3.56*  GLUCOSE 188* 168*  --     No results for input(s): LABPT, INR in the last 72 hours.   Assessment/Plan: 6 Days Post-Op Procedure(s) (LRB): IRRIGATION AND DEBRIDEMENT KNEE (Right) ARTHROSCOPY KNEE   Up with therapy Discharge to SNF  Continue PT with PWB on leg Will need follow up in my office in about 2 weeks

## 2016-10-24 NOTE — Progress Notes (Signed)
Clinical Child psychotherapistocial Worker (CSW) contacted [employer] admissions coordinator at [facility] and made him aware that outpatient dialysis is now being pursued. Per Gala Romneyoug when patient is ready for D/C updated clinicals [facility]   [employer]Bailey Lakenzie Mcclafferty, LCSW [phone]

## 2016-10-24 NOTE — Progress Notes (Signed)
[facility] [place] [facility]   PATIENT NAME: Juan Roberson    MR#:  [id number]  DATE OF BIRTH:  02/15/1971  SUBJECTIVE:  CHIEF COMPLAINT:   Chief Complaint  Patient presents with  . Influenza  . Abdominal Pain  . Knee Pain  still having lots of knee pain and requesting pain meds, unable to bear weight still. BP is running low, and have gradually worsening renal function.  pt also appears completely alert and oriented today.   he had temporary catheter and started HD . PPD placed. REVIEW OF SYSTEMS:  Review of Systems  Constitutional: Negative for chills, fever and weight loss.  HENT: Negative for nosebleeds and sore throat.   Eyes: Negative for blurred vision.  Respiratory: Negative for cough, shortness of breath and wheezing.   Cardiovascular: Negative for chest pain, orthopnea, leg swelling and PND.  Gastrointestinal: Negative for abdominal pain, constipation, diarrhea, heartburn, nausea and vomiting.  Genitourinary: Negative for dysuria and urgency.  Musculoskeletal: Positive for joint pain. Negative for back pain.  Skin: Negative for rash.  Neurological: Negative for dizziness, speech change, focal weakness and headaches.  Endo/Heme/Allergies: Does not bruise/bleed easily.  Psychiatric/Behavioral: Negative for depression.    DRUG ALLERGIES:   Allergies  Allergen Reactions  . Sulfur Other (See Comments)    Pt states that this medication causes renal failure.     VITALS:  Blood pressure 115/63, pulse 79, temperature 98.1 F (36.7 C), temperature source Oral, resp. rate 18, height 6\' 2"  (1.88 m), weight 92.7 kg (204 lb 4.8 oz), SpO2 100 %. PHYSICAL EXAMINATION:  Physical Exam  Constitutional: He is oriented to person, place, and time and well-developed, well-nourished, and in no distress.  HENT:  Head: Normocephalic and atraumatic.  Eyes: Conjunctivae and EOM are normal. Pupils are equal, round, and reactive to light.  Neck: Normal range of  motion. Neck supple. No tracheal deviation present. No thyromegaly present.  Cardiovascular: Normal rate, regular rhythm and normal heart sounds.   Pulmonary/Chest: Effort normal and breath sounds normal. No respiratory distress. He has no wheezes. He exhibits no tenderness.  Abdominal: Soft. Bowel sounds are normal. He exhibits no distension. There is no tenderness.  Musculoskeletal:       Right knee: He exhibits decreased range of motion and effusion. Tenderness found.  Neurological: He is alert and oriented to person, place, and time. No cranial nerve deficit.  Skin: Skin is warm and dry. No rash noted.  Psychiatric: Mood and affect normal.   LABORATORY PANEL:   CBC  Recent Labs Lab 10/23/16 0414  WBC 9.3  HGB 8.2*  HCT 24.2*  PLT 86*   ------------------------------------------------------------------------------------------------------------------ Chemistries   Recent Labs Lab 10/22/16 1125 10/23/16 0414 10/24/16 0455  NA 130* 134*  --   K 5.6* 4.7  --   CL 99* 101  --   CO2 15* 19*  --   GLUCOSE 188* 168*  --   BUN 123* 90*  --   CREATININE 6.70* 4.93* 3.56*  CALCIUM 8.2* 8.2*  --   AST 22  --   --   ALT <5*  --   --   ALKPHOS 94  --   --   BILITOT 3.6*  --   --    RADIOLOGY:  No results found. ASSESSMENT AND PLAN:  Patient is a 46 year old white male recently diagnosed with the flu now presents with sepsis  * MRSA Bacteremia, Sepsis: present on admission. Due to septic joint - continue IV  vancomycin- will need total 6 weeks from 10/18/16 - reviewed fluid c/s  Now with renal failure and need for HD, we may need to wait for final outcome and adjust Abx accordingly.  will need HD as out pt.   We can place permacath now as repeat cx negative.  * Septic Rt knee - Irrigation and debridement s/p arthoscopy on 1/17- reviewed cx result.  * Hyponatremia: - IVFs and monitor- improved.  * Influenza: Given one dose tamiflu- but improved now.  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  nephro suggest to continue HD as out pt.   Get permacath and work on d/c  * Diarrhea Resolved, could be viral  * Diabetes type 2 - continue sliding scale insulin - continue lantus and monitor  * History of DVT: no longer taking coumadin (his PCP asked him to stop)  * hypotension   Stopped clonidine patch.   Now stable.   All the records are reviewed and case discussed with Care Management/Social Worker. Management plans discussed with the patient, family (girlfriend at bedside), Nursing and they are in agreement.  CODE STATUS: FULL CODE  TOTAL TIME TAKING CARE OF THIS PATIENT: 35 minutes.   More than 50% of the time was spent in counseling/coordination of care: YES  POSSIBLE D/C IN 1-2 DAYS, DEPENDING ON CLINICAL CONDITION.   Altamese Dilling M.D on 10/24/2016 at 5:44 PM  Between 7am to 6pm - Pager - [phone]  After 6pm go to [email or web address] - Social research officer, government  [facility]  Office  [phone]  CC: Primary care physician; Corky Downs, MD  Note: This dictation was prepared with Dragon dictation along with smaller phrase technology. Any transcriptional errors that result from this process are unintentional.

## 2016-10-24 NOTE — Care Management (Signed)
If patient requires outpatient dialysis [facility] has a chair available Thursday this week per Susann Givensheryl Brawner with [facility]. It is unclear at this time if patient will need outpatient dialysis as his urine output "has improved".  PPD to be ready by the 25th. Chair time still pending for dialysis. CSW updated on need for 6 weeks IV Vanco per ID note.

## 2016-10-24 NOTE — Progress Notes (Signed)
SLP Cancellation Note  Patient Details Name: Juan BlossomWaltzie L Cooperman Jr. MRN: [id number] DOB: 07/05/1971   Cancelled treatment:       Reason Eval/Treat Not Completed: SLP screened, no needs identified, will sign off (reviewed chart notes; consulted pt/family re: status). Pt and family denied any swallowing issues (pt drinking liquids while in room w/out deficits noted). Pt conversed at conversational level w/ SLP and family w/ no deficits being reported. NSG agreed when consulted.  Pt denied any need for skilled ST services at this time. NSG to reconsult ST services if any change in status while admitted.     Juan SomKatherine Watson, MS, CCC-SLP Roberson,Katherine 10/24/2016, 9:50 AM

## 2016-10-24 NOTE — Care Management (Signed)
This RNCM never received reply for Susann Givensheryl Brawner confirming that she received everything I faxed for new dialysis. I have emailed Elnita MaxwellCheryl again requesting confirmation and included medical dialysis team on email along with Ledell NossMia Carter. I have updated Elnita MaxwellCheryl in today's email that Hepatitis results were faxed on today's date along with placement of PPD (results to be read by 10/26/16).

## 2016-10-24 NOTE — Consult Note (Signed)
[facility] Infectious Disease     Reason for Consult: MRSA septic joint and bacteremia    Referring Physician: Dolores Frame Date of Admission:  10/17/2016   Active Problems:   Sepsis ([place])   HPI: Juan Roberson. is a 46 y.o. male admitted with fevers, chills and R knee swelling and pain. BCX + MRSA, Knee swollen and cx + MRSA. Taken for wash out. Has been on IV vanco. He has progressive kidney dz and is on HD currently. No fevers. Knee improving.   Past Medical History:  Diagnosis Date  . Cellulitis and abscess of foot    Left-Dr. Vickki Muff  . CKD (chronic kidney disease), stage III   . Diabetes mellitus without complication ([place])    a. Dx ~ 1996.  . Essential hypertension   . Gastritis    a. 04/2015 hematemesis -> EGD: gastritis, esophagitis, duodenitis.  No active bleeding.  PPI added.  [name] GERD (gastroesophageal reflux disease)   . Left leg DVT ([place])    a. Dx 05/2015 -> Coumadin.  . MI (myocardial infarction)   . Osteomyelitis ([place])    a. 05/2015 L foot.   Past Surgical History:  Procedure Laterality Date  . ESOPHAGOGASTRODUODENOSCOPY N/A 04/07/2015   Procedure: ESOPHAGOGASTRODUODENOSCOPY (EGD);  Surgeon: Lucilla Lame, MD;  Location: [facility] ENDOSCOPY;  Service: Endoscopy;  Laterality: N/A;  . ESOPHAGOGASTRODUODENOSCOPY (EGD) WITH PROPOFOL Left 06/30/2015   Procedure: ESOPHAGOGASTRODUODENOSCOPY (EGD) WITH PROPOFOL;  Surgeon: Lollie Sails, MD;  Location: [facility] ENDOSCOPY;  Service: Endoscopy;  Laterality: Left;  . FOOT SURGERY    . I&D EXTREMITY Left 04/06/2015   Procedure: IRRIGATION AND DEBRIDEMENT EXTREMITY;  Surgeon: Samara Deist, DPM;  Location: [facility] ORS;  Service: Podiatry;  Laterality: Left;  . IRRIGATION AND DEBRIDEMENT FOOT Left 05/21/2015   Procedure: IRRIGATION AND DEBRIDEMENT FOOT;  Surgeon: Sharlotte Alamo, MD;  Location: [facility] ORS;  Service: Podiatry;  Laterality: Left;  . IRRIGATION AND DEBRIDEMENT KNEE Right 10/18/2016   Procedure: IRRIGATION AND DEBRIDEMENT KNEE;  Surgeon:  Earnestine Leys, MD;  Location: [facility] ORS;  Service: Orthopedics;  Laterality: Right;  . KNEE ARTHROSCOPY  10/18/2016   Procedure: ARTHROSCOPY KNEE;  Surgeon: Earnestine Leys, MD;  Location: [facility] ORS;  Service: Orthopedics;;   Social History  Substance Use Topics  . Smoking status: Never Smoker  . Smokeless tobacco: Never Used  . Alcohol use No   Family History  Problem Relation Age of Onset  . Coronary artery disease Father   . Pancreatic cancer Mother   . Breast cancer Sister   . Lung cancer Brother   . Cervical cancer Sister     Allergies:  Allergies  Allergen Reactions  . Sulfur Other (See Comments)    Pt states that this medication causes renal failure.      Current antibiotics: Antibiotics Given (last 72 hours)    Date/Time Action Medication Dose Rate   10/22/16 1707 Given  [given last hour of dialysis]   vancomycin (VANCOCIN) IVPB 1000 mg/200 mL premix 1,000 mg 200 mL/hr   10/23/16 1242 Given  [given during dialysis]   vancomycin (VANCOCIN) IVPB 1000 mg/200 mL premix 1,000 mg 200 mL/hr      MEDICATIONS: . amiodarone  200 mg Oral Daily  . aspirin  325 mg Oral Daily  . guaiFENesin  600 mg Oral BID   And  . dextromethorphan  30 mg Oral BID  . feeding supplement (NEPRO CARB STEADY)  237 mL Oral BID BM  . heparin subcutaneous  5,000 Units Subcutaneous Q8H  .  insulin aspart  0-9 Units Subcutaneous TID PC & HS  . methimazole  10 mg Oral Daily  . metoprolol  50 mg Oral Daily  . pantoprazole  40 mg Oral Daily  . polyethylene glycol  17 g Oral Daily  . senna  1 tablet Oral BID  . tuberculin  5 Units Intradermal Once    Review of Systems - 11 systems reviewed and negative per HPI   OBJECTIVE: Temp:  [97.6 F (36.4 C)-98.6 F (37 C)] 97.6 F (36.4 C) (01/23 0820) Pulse Rate:  [86-98] 86 (01/23 0820) Resp:  [17-18] 18 (01/23 0820) BP: (116-130)/(66-70) 130/70 (01/23 0820) SpO2:  [96 %-97 %] 97 % (01/23 0820) Weight:  [92.7 kg (204 lb 4.8 oz)] 92.7 kg (204 lb 4.8  oz) (01/23 0355) Physical Exam  Constitutional: He is oriented to person, place, and time. He appears well-developed and well-nourished. No distress.  HENT: anicteric Mouth/Throat: Oropharynx is clear and moist. No oropharyngeal exudate.  Cardiovascular: Normal rate, regular rhythm and normal heart sounds.  Pulmonary/Chest: Effort normal and breath sounds normal. No respiratory distress. He has no wheezes.  Abdominal: Soft. Bowel sounds are normal. He exhibits no distension. There is no tenderness.  Lymphadenopathy:  He has no cervical adenopathy.  Ext R knee wrapped. Mild swelling Neurological: He is alert and oriented to person, place, and time.  Skin: Skin is warm and dry. No rash noted. No erythema.  Psychiatric: He has a normal mood and affect. His behavior is normal.     LABS: Results for orders placed or performed during the hospital encounter of 10/17/16 (from the past 48 hour(s))  Glucose, capillary     Status: Abnormal   Collection Time: 10/22/16  5:18 PM  Result Value Ref Range   Glucose-Capillary 172 (H) 65 - 99 mg/dL   Comment 1 Notify RN    Comment 2 Document in Chart   Glucose, capillary     Status: Abnormal   Collection Time: 10/22/16 10:01 PM  Result Value Ref Range   Glucose-Capillary 160 (H) 65 - 99 mg/dL   Comment 1 Notify RN   Basic metabolic panel     Status: Abnormal   Collection Time: 10/23/16  4:14 AM  Result Value Ref Range   Sodium 134 (L) 135 - 145 mmol/L   Potassium 4.7 3.5 - 5.1 mmol/L   Chloride 101 101 - 111 mmol/L   CO2 19 (L) 22 - 32 mmol/L   Glucose, Bld 168 (H) 65 - 99 mg/dL   BUN 90 (H) 6 - 20 mg/dL   Creatinine, Ser 4.93 (H) 0.61 - 1.24 mg/dL   Calcium 8.2 (L) 8.9 - 10.3 mg/dL   GFR calc non Af Amer 13 (L) >60 mL/min   GFR calc Af Amer 15 (L) >60 mL/min    Comment: (NOTE) The eGFR has been calculated using the CKD EPI equation. This calculation has not been validated in all clinical situations. eGFR's persistently <60 mL/min signify  possible Chronic Kidney Disease.    Anion gap 14 5 - 15  CBC     Status: Abnormal   Collection Time: 10/23/16  4:14 AM  Result Value Ref Range   WBC 9.3 3.8 - 10.6 K/uL   RBC 3.01 (L) 4.40 - 5.90 MIL/uL   Hemoglobin 8.2 (L) 13.0 - 18.0 g/dL   HCT 24.2 (L) 40.0 - 52.0 %   MCV 80.3 80.0 - 100.0 fL   MCH 27.1 26.0 - 34.0 pg   MCHC 33.8 32.0 -  36.0 g/dL   RDW 16.0 (H) 11.5 - 14.5 %   Platelets 86 (L) 150 - 440 K/uL  Glucose, capillary     Status: Abnormal   Collection Time: 10/23/16  8:16 AM  Result Value Ref Range   Glucose-Capillary 180 (H) 65 - 99 mg/dL   Comment 1 Notify RN    Comment 2 Document in Chart   Glucose, capillary     Status: Abnormal   Collection Time: 10/23/16 12:39 PM  Result Value Ref Range   Glucose-Capillary 140 (H) 65 - 99 mg/dL   Comment 1 Notify RN    Comment 2 Document in Chart   Glucose, capillary     Status: Abnormal   Collection Time: 10/23/16  4:23 PM  Result Value Ref Range   Glucose-Capillary 141 (H) 65 - 99 mg/dL  Glucose, capillary     Status: Abnormal   Collection Time: 10/23/16  9:01 PM  Result Value Ref Range   Glucose-Capillary 154 (H) 65 - 99 mg/dL  Creatinine, serum     Status: Abnormal   Collection Time: 10/24/16  4:55 AM  Result Value Ref Range   Creatinine, Ser 3.56 (H) 0.61 - 1.24 mg/dL   GFR calc non Af Amer 19 (L) >60 mL/min   GFR calc Af Amer 22 (L) >60 mL/min    Comment: (NOTE) The eGFR has been calculated using the CKD EPI equation. This calculation has not been validated in all clinical situations. eGFR's persistently <60 mL/min signify possible Chronic Kidney Disease.   Glucose, capillary     Status: Abnormal   Collection Time: 10/24/16  8:26 AM  Result Value Ref Range   Glucose-Capillary 186 (H) 65 - 99 mg/dL  Vancomycin, random     Status: None   Collection Time: 10/24/16 10:28 AM  Result Value Ref Range   Vancomycin Rm 23     Comment:        Random Vancomycin therapeutic range is dependent on dosage and time  of specimen collection. A peak range is 20.0-40.0 ug/mL A trough range is 5.0-15.0 ug/mL          Glucose, capillary     Status: Abnormal   Collection Time: 10/24/16 10:58 AM  Result Value Ref Range   Glucose-Capillary 250 (H) 65 - 99 mg/dL   No components found for: ESR, C REACTIVE PROTEIN MICRO: Recent Results (from the past 720 hour(s))  Blood culture (routine x 2)     Status: Abnormal   Collection Time: 10/17/16  5:11 PM  Result Value Ref Range Status   Specimen Description BLOOD LEFT HAND  Final   Special Requests   Final    BOTTLES DRAWN AEROBIC AND ANAEROBIC AER 4ML ANA 4ML   Culture  Setup Time   Final    GRAM POSITIVE COCCI IN BOTH AEROBIC AND ANAEROBIC BOTTLES CRITICAL RESULT CALLED TO, READ BACK BY AND VERIFIED WITH: CHRISTINE KATSOUDAS 10/18/16 3419 SGD Performed at [facility], [place] [address] [place], [place] [address]    Culture METHICILLIN RESISTANT STAPHYLOCOCCUS AUREUS (A)  Final   Report Status 10/20/2016 FINAL  Final   Organism ID, Bacteria METHICILLIN RESISTANT STAPHYLOCOCCUS AUREUS  Final      Susceptibility   Methicillin resistant staphylococcus aureus - MIC*    CIPROFLOXACIN >=8 RESISTANT Resistant     ERYTHROMYCIN >=8 RESISTANT Resistant     GENTAMICIN <=0.5 SENSITIVE Sensitive     OXACILLIN 2 RESISTANT Resistant     TETRACYCLINE <=1 SENSITIVE Sensitive  VANCOMYCIN <=0.5 SENSITIVE Sensitive     TRIMETH/SULFA <=10 SENSITIVE Sensitive     CLINDAMYCIN <=0.25 SENSITIVE Sensitive     RIFAMPIN <=0.5 SENSITIVE Sensitive     Inducible Clindamycin NEGATIVE Sensitive     * METHICILLIN RESISTANT STAPHYLOCOCCUS AUREUS  Blood Culture ID Panel (Reflexed)     Status: Abnormal   Collection Time: 10/17/16  5:11 PM  Result Value Ref Range Status   Enterococcus species NOT DETECTED NOT DETECTED Final   Listeria monocytogenes NOT DETECTED NOT DETECTED Final   Staphylococcus species DETECTED (A) NOT DETECTED Final    Comment: CRITICAL RESULT CALLED TO, READ  BACK BY AND VERIFIED WITH: CHRISTINE KATSOUDAS 10/18/16 0925 SGD    Staphylococcus aureus DETECTED (A) NOT DETECTED Final    Comment: CRITICAL RESULT CALLED TO, READ BACK BY AND VERIFIED WITH: CHRISTINE KATSOUDAS 10/18/16 0925 SGD    Methicillin resistance DETECTED (A) NOT DETECTED Final    Comment: CRITICAL RESULT CALLED TO, READ BACK BY AND VERIFIED WITH: CHRISTINE KATSOUDAS 10/18/16 0925 SGD    Streptococcus species NOT DETECTED NOT DETECTED Final   Streptococcus agalactiae NOT DETECTED NOT DETECTED Final   Streptococcus pneumoniae NOT DETECTED NOT DETECTED Final   Streptococcus pyogenes NOT DETECTED NOT DETECTED Final   Acinetobacter baumannii NOT DETECTED NOT DETECTED Final   Enterobacteriaceae species NOT DETECTED NOT DETECTED Final   Enterobacter cloacae complex NOT DETECTED NOT DETECTED Final   Escherichia coli NOT DETECTED NOT DETECTED Final   Klebsiella oxytoca NOT DETECTED NOT DETECTED Final   Klebsiella pneumoniae NOT DETECTED NOT DETECTED Final   Proteus species NOT DETECTED NOT DETECTED Final   Serratia marcescens NOT DETECTED NOT DETECTED Final   Haemophilus influenzae NOT DETECTED NOT DETECTED Final   Neisseria meningitidis NOT DETECTED NOT DETECTED Final   Pseudomonas aeruginosa NOT DETECTED NOT DETECTED Final   Candida albicans NOT DETECTED NOT DETECTED Final   Candida glabrata NOT DETECTED NOT DETECTED Final   Candida krusei NOT DETECTED NOT DETECTED Final   Candida parapsilosis NOT DETECTED NOT DETECTED Final   Candida tropicalis NOT DETECTED NOT DETECTED Final  Body fluid culture     Status: None   Collection Time: 10/17/16  6:22 PM  Result Value Ref Range Status   Specimen Description Right Knee  Final   Special Requests NONE  Final   Gram Stain   Final    MODERATE WBC PRESENT, PREDOMINANTLY PMN FEW GRAM POSITIVE COCCI IN PAIRS    Culture   Final    ABUNDANT METHICILLIN RESISTANT STAPHYLOCOCCUS AUREUS NO ANAEROBES ISOLATED Performed at [facility]  Lab, [address], [place], [place] [address]    Report Status 10/20/2016 FINAL  Final   Organism ID, Bacteria METHICILLIN RESISTANT STAPHYLOCOCCUS AUREUS  Final      Susceptibility   Methicillin resistant staphylococcus aureus - MIC*    CIPROFLOXACIN >=8 RESISTANT Resistant     ERYTHROMYCIN >=8 RESISTANT Resistant     GENTAMICIN <=0.5 SENSITIVE Sensitive     OXACILLIN 2 RESISTANT Resistant     TETRACYCLINE <=1 SENSITIVE Sensitive     VANCOMYCIN <=0.5 SENSITIVE Sensitive     TRIMETH/SULFA <=10 SENSITIVE Sensitive     CLINDAMYCIN <=0.25 SENSITIVE Sensitive     RIFAMPIN <=0.5 SENSITIVE Sensitive     Inducible Clindamycin NEGATIVE Sensitive     * ABUNDANT METHICILLIN RESISTANT STAPHYLOCOCCUS AUREUS  Blood culture (routine x 2)     Status: None   Collection Time: 10/17/16  8:37 PM  Result Value Ref Range Status  Specimen Description BLOOD RIGHT ARM  Final   Special Requests BOTTLES DRAWN AEROBIC AND ANAEROBIC  [place]  Final   Culture NO GROWTH 5 DAYS  Final   Report Status 10/22/2016 FINAL  Final  MRSA PCR Screening     Status: Abnormal   Collection Time: 10/17/16  8:50 PM  Result Value Ref Range Status   MRSA by PCR POSITIVE (A) NEGATIVE Final    Comment:        The GeneXpert MRSA Assay (FDA approved for NASAL specimens only), is one component of a comprehensive MRSA colonization surveillance program. It is not intended to diagnose MRSA infection nor to guide or monitor treatment for MRSA infections. RESULT CALLED TO, READ BACK BY AND VERIFIED WITH: VIVIAN ALI AT 2242 ON 10/17/2016 JLJ   Culture, blood (single) w Reflex to ID Panel     Status: None   Collection Time: 10/18/16 11:36 AM  Result Value Ref Range Status   Specimen Description BLOOD L HAND  Final   Special Requests BOTTLES DRAWN AEROBIC AND ANAEROBIC 2ML  Final   Culture NO GROWTH 5 DAYS  Final   Report Status 10/23/2016 FINAL  Final  Aerobic/Anaerobic Culture (surgical/deep wound)     Status: None   Collection  Time: 10/18/16  9:18 PM  Result Value Ref Range Status   Specimen Description KNEE  Final   Special Requests NONE  Final   Gram Stain   Final    ABUNDANT WBC PRESENT, PREDOMINANTLY PMN MODERATE GRAM POSITIVE COCCI IN PAIRS IN CLUSTERS    Culture   Final    ABUNDANT METHICILLIN RESISTANT STAPHYLOCOCCUS AUREUS NO ANAEROBES ISOLATED Performed at [facility], [address], [place], [place] [address]    Report Status 10/24/2016 FINAL  Final   Organism ID, Bacteria METHICILLIN RESISTANT STAPHYLOCOCCUS AUREUS  Final      Susceptibility   Methicillin resistant staphylococcus aureus - MIC*    CIPROFLOXACIN >=8 RESISTANT Resistant     ERYTHROMYCIN >=8 RESISTANT Resistant     GENTAMICIN <=0.5 SENSITIVE Sensitive     OXACILLIN 2 RESISTANT Resistant     TETRACYCLINE <=1 SENSITIVE Sensitive     VANCOMYCIN 1 SENSITIVE Sensitive     TRIMETH/SULFA <=10 SENSITIVE Sensitive     CLINDAMYCIN <=0.25 SENSITIVE Sensitive     RIFAMPIN <=0.5 SENSITIVE Sensitive     Inducible Clindamycin NEGATIVE Sensitive     * ABUNDANT METHICILLIN RESISTANT STAPHYLOCOCCUS AUREUS    IMAGING: Dg Chest 2 View  Result Date: 10/17/2016 CLINICAL DATA:  Diagnosed with flu 2 days ago, fever and diarrhea. Twisted RIGHT knee yesterday. History of hypertension, diabetes. EXAM: CHEST  2 VIEW COMPARISON:  Chest radiograph October 15, 2016 FINDINGS: Cardiac silhouette is mildly enlarged and unchanged. Mediastinal silhouette is nonsuspicious. Low lung volumes. Strandy densities LEFT lung base with small bilateral pleural effusions. No pneumothorax. Soft tissue planes and included osseous structures are nonsuspicious. Possible cholelithiasis. IMPRESSION: Stable cardiomegaly. Small LEFT pleural effusion with LEFT lung base atelectasis, less likely pneumonia. Electronically Signed   By: Elon Alas M.D.   On: 10/17/2016 17:57   Dg Chest 2 View  Result Date: 10/15/2016 CLINICAL DATA:  Cough, shortness of breath, vomiting,  diarrhea for 1 week. EXAM: CHEST  2 VIEW COMPARISON:  09/24/2016. FINDINGS: Stable mildly enlarged cardiac silhouette. Interval small left pleural effusion and small amount of left basilar atelectasis. Minimal right basilar atelectasis. Small posterior right pleural effusion. The bones appear osteopenic. IMPRESSION: Small bilateral pleural effusions with minimal bibasilar atelectasis.  Electronically Signed   By: Claudie Revering M.D.   On: 10/15/2016 14:12   Dg Knee 1-2 Views Right  Result Date: 10/17/2016 CLINICAL DATA:  46 year old male with right knee pain after twisting knee yesterday walking to bathroom. Initial encounter. EXAM: RIGHT KNEE - 1-2 VIEW COMPARISON:  01/07/2016. FINDINGS: Two-view examination without evidence of fracture or dislocation. Lateral soft tissue swelling may be present. Mild medial and lateral tibiofemoral joint space narrowing. Mild to moderate patellofemoral joint degenerative changes. Moderate joint effusion. IMPRESSION: Two-view exam without fracture or dislocation. Tricompartment degenerative changes with joint effusion as noted above. Lateral soft tissue injury is a possibility. Electronically Signed   By: Genia Del M.D.   On: 10/17/2016 17:59   Mr Knee Right Wo Contrast  Result Date: 10/18/2016 CLINICAL DATA:  46 year old with severe right knee pain and inability to walk. Possible septic knee or cellulitis. History of diabetes, chronic kidney disease and hypertension. EXAM: MRI OF THE RIGHT KNEE WITHOUT CONTRAST TECHNIQUE: Multiplanar, multisequence MR imaging of the knee was performed. No intravenous contrast was administered. COMPARISON:  Radiographs 10/17/2016. FINDINGS: MENISCI Medial meniscus: Mild degeneration. No evidence of meniscal tear or displaced fragment. Lateral meniscus:  Intact with normal morphology. LIGAMENTS Cruciates:  Intact. Collaterals:  Intact. CARTILAGE Patellofemoral: Moderate patellofemoral chondral thinning, surface irregularity and subchondral  cyst formation laterally in the patella and trochlea. Medial: Mild chondral thinning and surface irregularity without focal defect. There is mild subchondral cyst formation peripherally in the medial tibial plateau. Lateral: Mild chondral thinning and surface irregularity with small osteophytes. MISCELLANEOUS Joint:  Moderate-sized mildly complex appearing joint effusion. Popliteal Fossa:  Unremarkable. No significant Baker's cyst. Extensor Mechanism:  Intact. Bones: No acute osseous findings are demonstrated. There is no cortical destruction or subchondral edema to suggest infection. Other: There is mild subcutaneous edema surrounding the knee, greatest anteriorly and laterally. No focal fluid collections are seen. IMPRESSION: 1. Nonspecific subcutaneous edema surrounding the knee, greatest anterolaterally. This could reflect cellulitis. 2. Nonspecific moderate size knee joint effusion. No specific evidence of septic arthritis or osteomyelitis. Arthrocentesis should be considered if there is strong clinical concern of infection. 3. Tricompartmental degenerative changes, greatest within the patellofemoral compartment. 4. The menisci, cruciate and collateral ligaments are intact. Electronically Signed   By: Richardean Sale M.D.   On: 10/18/2016 10:44    Assessment:   Elena Cothern. is a 46 y.o. male with  MRSA bacteremia in patient with DM, CKD and recent DX of flu. He also has a R knee swelling following twisitng it.  On admit wbc 19, temp nml. UA 6-30 wbc , CXR with small L effusion and possible infiltrate vs atelectasis Synovial fluid 22K wbc 98% PMNS, GS + GPC in chains and cx pending BCX + MRSA S/p R knee washout 1/17 with findings of septic knee Repeat bcx neg 1/17  ESR 63, CRP 27. HIV NEG.  ECHO showed dilated cardiomyopathy but no vegetations Recommendations For the MRSA bacteremia and  septic knee he will need 6 weeks IV vanco so unless he is hemodynamically unstable or concern for valve  ring abscess he would not need TEE. Since bcx done 1/17 are neg x 48 hours - can place HD permacath.  Will follow - see abx order sheet for details of IV abx order   Recommendations  Thank you very much for allowing me to participate in the care of this patient. Please call with questions.   Cheral Marker. Ola Spurr, MD

## 2016-10-24 NOTE — Care Management (Signed)
This RNCM received notification from Susann Givensheryl Brawner with [employer]/dialysis liasion that she did receive all RNCM faxes and will start authorization with [name] PPD results needed by 10/26/16.

## 2016-10-24 NOTE — Progress Notes (Signed)
Prime doc paged. 

## 2016-10-24 NOTE — Progress Notes (Signed)
Physical Therapy Treatment Patient Details Name: Juan BlossomWaltzie L Parillo Jr. MRN: [id number] DOB: 05/06/1971 Today's Date: 10/24/2016    History of Present Illness Patient is a 46 y/o male that presents s/p I&D of R knee with underlying pneumonia and recent flu as well. Currently undergoing HD treatments    PT Comments    Pt alert and oriented this morning and willing to participate in Pt. Stated he was feeling better and demonstrated increase in overall strength. Pt limited to supine therex exercises due to L femoral catheter precautions. Pt able to tolerate resisted UE exercises w/o increase in fatigue. Pt able to perform R LE therex w/ limited ROM, unable to obtain full knee ext by at least 10 degrees.He required min assist during R LE exercises. Towel was placed under R foot after treatment to promote knee ext. Pt still presents with poor functional mobility due to strength, ROM deficits and poor activity tolerance. He will continue to benefit from skilled PT .    Follow Up Recommendations  SNF     Equipment Recommendations  Rolling walker with 5" wheels    Recommendations for Other Services       Precautions / Restrictions Precautions Precautions: Fall Precaution Comments: L Femoral artery catheter-  Restrictions Weight Bearing Restrictions: Yes RLE Weight Bearing: Partial weight bearing RLE Partial Weight Bearing Percentage or Pounds: 50% Other Position/Activity Restrictions: No L active or passive L Hip movements    Mobility  Bed Mobility               General bed mobility comments: Bed mobility deferred due to femoral catheter in L LE  Transfers                 General transfer comment: not assessed due to L LE femoral   Ambulation/Gait             General Gait Details: did not attempt    Stairs            Wheelchair Mobility    Modified Rankin (Stroke Patients Only)       Balance       Sitting balance - Comments: did not attempt due to  precautions w/ L femoral catheter        Standing balance comment: did not attempt due to precautions w/ L femoral catheter                     Cognition Arousal/Alertness: Awake/alert Behavior During Therapy: WFL for tasks assessed/performed Overall Cognitive Status: Within Functional Limits for tasks assessed                      Exercises Other Exercises Other Exercises: Supine UE therex; resisted AROM 1x12; Shoulder flex/ext/abd/add/, elbow flex/ext to improve UE strength. Rythmic stabilization for 60 secs to improve core strength Other Exercises: Supine LE therex; AROM 1x12; ankle pumps, SAQs, heel slides, SLR, hip abd to improve LE strength and ROM, increase in R knee pain w/ SAQs    General Comments        Pertinent Vitals/Pain Pain Assessment: No/denies pain    Home Living                      Prior Function            PT Goals (current goals can now be found in the care plan section) Acute Rehab PT Goals Patient Stated Goal: To return home  PT Goal  Formulation: With patient Time For Goal Achievement: 11/02/16 Potential to Achieve Goals: Fair Progress towards PT goals: Progressing toward goals    Frequency    7X/week      PT Plan Current plan remains appropriate    Co-evaluation             End of Session   Activity Tolerance: Patient tolerated treatment well Patient left: in bed;with bed alarm set;with family/visitor present;with call bell/phone within reach     Time: 1610-9604 PT Time Calculation (min) (ACUTE ONLY): 13 min  Charges:                       G Codes:      Dylynn Ketner 11-07-2016, 11:31 AM

## 2016-10-24 NOTE — Progress Notes (Signed)
[facility]  ROUNDING NOTE   Subjective:   Patient states he feels improved, and urine output has increased. Denies SOB, nausea, vomiting, able to eat a little more today.   Objective:  Vital signs in last 24 hours:  Temp:  [97.6 F (36.4 C)-99 F (37.2 C)] 97.6 F (36.4 C) (01/23 0820) Pulse Rate:  [83-98] 86 (01/23 0820) Resp:  [17-18] 18 (01/23 0820) BP: (116-130)/(65-70) 130/70 (01/23 0820) SpO2:  [96 %-97 %] 97 % (01/23 0820) Weight:  [92.7 kg (204 lb 4.8 oz)] 92.7 kg (204 lb 4.8 oz) (01/23 0355)  Weight change: -1.7 kg (-3 lb 12 oz) Filed Weights   10/22/16 1826 10/23/16 1040 10/24/16 0355  Weight: 93.3 kg (205 lb 11 oz) 91.9 kg (202 lb 9.6 oz) 92.7 kg (204 lb 4.8 oz)    Intake/Output: I/O last 3 completed shifts: In: 240 [P.O.:240] Out: 500 [Other:500]   Intake/Output this shift:  No intake/output data recorded.  Physical Exam: General: NAD, laying in bed  Head: Normocephalic, atraumatic. Moist oral mucosal membranes. Temporal wasting  Eyes: Anicteric  Neck: Supple  Lungs:  Clear to auscultation, barrell chest  Heart: Regular rate and rhythm, +s3 gallop, no rub  Abdomen:  Soft, nontender  Extremities: trace peripheral edema, right knee drain  Neurologic: Nonfocal, moving all four extremities  Skin: No lesions, multiple tattoos  Access: Left femoral temp HD catheter 1/19 Dr. Gilda Crease    Basic Metabolic Panel:  Recent Labs Lab 10/19/16 1610 10/20/16 0805 10/21/16 9604 10/21/16 1012 10/22/16 0412 10/22/16 1125 10/23/16 0414 10/24/16 0455  NA 130* 125* 127*  --   --  130* 134*  --   K 5.1 5.2* 5.3*  --   --  5.6* 4.7  --   CL 101 97* 99*  --   --  99* 101  --   CO2 17* 17* 15*  --   --  15* 19*  --   GLUCOSE 197* 169* 118*  --   --  188* 168*  --   BUN 107* 131* 141*  --   --  123* 90*  --   CREATININE 5.59* 6.78* 7.31*  --  6.42* 6.70* 4.93* 3.56*  CALCIUM 8.4* 8.3* 8.0*  --   --  8.2* 8.2*  --   PHOS  --   --   --  7.3*  --  7.4*   --   --     Liver Function Tests:  Recent Labs Lab 10/22/16 1125  AST 22  ALT <5*  ALKPHOS 94  BILITOT 3.6*  PROT 5.8*  ALBUMIN 2.0*   No results for input(s): LIPASE, AMYLASE in the last 168 hours. No results for input(s): AMMONIA in the last 168 hours.  CBC:  Recent Labs Lab 10/18/16 0011 10/20/16 0805 10/21/16 0339 10/21/16 1012 10/23/16 0414  WBC 12.6* 9.3 9.0 9.6 9.3  NEUTROABS  --   --   --  8.0*  --   HGB 8.4* 8.3* 8.3* 8.1* 8.2*  HCT 26.4* 25.7* 25.5* 25.3* 24.2*  MCV 83.0 82.3 82.9 83.5 80.3  PLT 93* 74* 76* 80* 86*    Cardiac Enzymes: No results for input(s): CKTOTAL, CKMB, CKMBINDEX, TROPONINI in the last 168 hours.  BNP: Invalid input(s): POCBNP  CBG:  Recent Labs Lab 10/23/16 1239 10/23/16 1623 10/23/16 2101 10/24/16 0826 10/24/16 1058  GLUCAP 140* 141* 154* 186* 250*    Microbiology: Results for orders placed or performed during the hospital encounter of 10/17/16  Blood culture (  routine x 2)     Status: Abnormal   Collection Time: 10/17/16  5:11 PM  Result Value Ref Range Status   Specimen Description BLOOD LEFT HAND  Final   Special Requests   Final    BOTTLES DRAWN AEROBIC AND ANAEROBIC AER ANA   Culture  Setup Time   Final    GRAM POSITIVE COCCI IN BOTH AEROBIC AND ANAEROBIC BOTTLES CRITICAL RESULT CALLED TO, READ BACK BY AND VERIFIED WITH: CHRISTINE KATSOUDAS 10/18/16 1610 SGD Performed at [facility], [address] [place], [place] [address]    Culture METHICILLIN RESISTANT STAPHYLOCOCCUS AUREUS (A)  Final   Report Status 10/20/2016 FINAL  Final   Organism ID, Bacteria METHICILLIN RESISTANT STAPHYLOCOCCUS AUREUS  Final      Susceptibility   Methicillin resistant staphylococcus aureus - MIC*    CIPROFLOXACIN >=8 RESISTANT Resistant     ERYTHROMYCIN >=8 RESISTANT Resistant     GENTAMICIN <=0.5 SENSITIVE Sensitive     OXACILLIN 2 RESISTANT Resistant     TETRACYCLINE <=1 SENSITIVE Sensitive     VANCOMYCIN  <=0.5 SENSITIVE Sensitive     TRIMETH/SULFA <=10 SENSITIVE Sensitive     CLINDAMYCIN <=0.25 SENSITIVE Sensitive     RIFAMPIN <=0.5 SENSITIVE Sensitive     Inducible Clindamycin NEGATIVE Sensitive     * METHICILLIN RESISTANT STAPHYLOCOCCUS AUREUS  Blood Culture ID Panel (Reflexed)     Status: Abnormal   Collection Time: 10/17/16  5:11 PM  Result Value Ref Range Status   Enterococcus species NOT DETECTED NOT DETECTED Final   Listeria monocytogenes NOT DETECTED NOT DETECTED Final   Staphylococcus species DETECTED (A) NOT DETECTED Final    Comment: CRITICAL RESULT CALLED TO, READ BACK BY AND VERIFIED WITH: CHRISTINE KATSOUDAS 10/18/16 0925 SGD    Staphylococcus aureus DETECTED (A) NOT DETECTED Final    Comment: CRITICAL RESULT CALLED TO, READ BACK BY AND VERIFIED WITH: CHRISTINE KATSOUDAS 10/18/16 0925 SGD    Methicillin resistance DETECTED (A) NOT DETECTED Final    Comment: CRITICAL RESULT CALLED TO, READ BACK BY AND VERIFIED WITH: CHRISTINE KATSOUDAS 10/18/16 0925 SGD    Streptococcus species NOT DETECTED NOT DETECTED Final   Streptococcus agalactiae NOT DETECTED NOT DETECTED Final   Streptococcus pneumoniae NOT DETECTED NOT DETECTED Final   Streptococcus pyogenes NOT DETECTED NOT DETECTED Final   Acinetobacter baumannii NOT DETECTED NOT DETECTED Final   Enterobacteriaceae species NOT DETECTED NOT DETECTED Final   Enterobacter cloacae complex NOT DETECTED NOT DETECTED Final   Escherichia coli NOT DETECTED NOT DETECTED Final   Klebsiella oxytoca NOT DETECTED NOT DETECTED Final   Klebsiella pneumoniae NOT DETECTED NOT DETECTED Final   Proteus species NOT DETECTED NOT DETECTED Final   Serratia marcescens NOT DETECTED NOT DETECTED Final   Haemophilus influenzae NOT DETECTED NOT DETECTED Final   Neisseria meningitidis NOT DETECTED NOT DETECTED Final   Pseudomonas aeruginosa NOT DETECTED NOT DETECTED Final   Candida albicans NOT DETECTED NOT DETECTED Final   Candida glabrata NOT DETECTED  NOT DETECTED Final   Candida krusei NOT DETECTED NOT DETECTED Final   Candida parapsilosis NOT DETECTED NOT DETECTED Final   Candida tropicalis NOT DETECTED NOT DETECTED Final  Body fluid culture     Status: None   Collection Time: 10/17/16  6:22 PM  Result Value Ref Range Status   Specimen Description Right Knee  Final   Special Requests NONE  Final   Gram Stain   Final    MODERATE WBC PRESENT, PREDOMINANTLY PMN  FEW GRAM POSITIVE COCCI IN PAIRS    Culture   Final    ABUNDANT METHICILLIN RESISTANT STAPHYLOCOCCUS AUREUS NO ANAEROBES ISOLATED Performed at [facility], [address] [place], [place] [address]    Report Status 10/20/2016 FINAL  Final   Organism ID, Bacteria METHICILLIN RESISTANT STAPHYLOCOCCUS AUREUS  Final      Susceptibility   Methicillin resistant staphylococcus aureus - MIC*    CIPROFLOXACIN >=8 RESISTANT Resistant     ERYTHROMYCIN >=8 RESISTANT Resistant     GENTAMICIN <=0.5 SENSITIVE Sensitive     OXACILLIN 2 RESISTANT Resistant     TETRACYCLINE <=1 SENSITIVE Sensitive     VANCOMYCIN <=0.5 SENSITIVE Sensitive     TRIMETH/SULFA <=10 SENSITIVE Sensitive     CLINDAMYCIN <=0.25 SENSITIVE Sensitive     RIFAMPIN <=0.5 SENSITIVE Sensitive     Inducible Clindamycin NEGATIVE Sensitive     * ABUNDANT METHICILLIN RESISTANT STAPHYLOCOCCUS AUREUS  Blood culture (routine x 2)     Status: None   Collection Time: 10/17/16  8:37 PM  Result Value Ref Range Status   Specimen Description BLOOD RIGHT ARM  Final   Special Requests BOTTLES DRAWN AEROBIC AND ANAEROBIC  9CC  Final   Culture NO GROWTH 5 DAYS  Final   Report Status 10/22/2016 FINAL  Final  MRSA PCR Screening     Status: Abnormal   Collection Time: 10/17/16  8:50 PM  Result Value Ref Range Status   MRSA by PCR POSITIVE (A) NEGATIVE Final    Comment:        The GeneXpert MRSA Assay (FDA approved for NASAL specimens only), is one component of a comprehensive MRSA colonization surveillance program. It is  not intended to diagnose MRSA infection nor to guide or monitor treatment for MRSA infections. RESULT CALLED TO, READ BACK BY AND VERIFIED WITH: VIVIAN ALI AT 2242 ON 10/17/2016 JLJ   Culture, blood (single) w Reflex to ID Panel     Status: None   Collection Time: 10/18/16 11:36 AM  Result Value Ref Range Status   Specimen Description BLOOD L HAND  Final   Special Requests BOTTLES DRAWN AEROBIC AND ANAEROBIC  Final   Culture NO GROWTH 5 DAYS  Final   Report Status 10/23/2016 FINAL  Final  Aerobic/Anaerobic Culture (surgical/deep wound)     Status: None   Collection Time: 10/18/16  9:18 PM  Result Value Ref Range Status   Specimen Description KNEE  Final   Special Requests NONE  Final   Gram Stain   Final    ABUNDANT WBC PRESENT, PREDOMINANTLY PMN MODERATE GRAM POSITIVE COCCI IN PAIRS IN CLUSTERS    Culture   Final    ABUNDANT METHICILLIN RESISTANT STAPHYLOCOCCUS AUREUS NO ANAEROBES ISOLATED Performed at [facility], [address], [place], [place] [address]    Report Status 10/24/2016 FINAL  Final   Organism ID, Bacteria METHICILLIN RESISTANT STAPHYLOCOCCUS AUREUS  Final      Susceptibility   Methicillin resistant staphylococcus aureus - MIC*    CIPROFLOXACIN >=8 RESISTANT Resistant     ERYTHROMYCIN >=8 RESISTANT Resistant     GENTAMICIN <=0.5 SENSITIVE Sensitive     OXACILLIN 2 RESISTANT Resistant     TETRACYCLINE <=1 SENSITIVE Sensitive     VANCOMYCIN 1 SENSITIVE Sensitive     TRIMETH/SULFA <=10 SENSITIVE Sensitive     CLINDAMYCIN <=0.25 SENSITIVE Sensitive     RIFAMPIN <=0.5 SENSITIVE Sensitive     Inducible Clindamycin NEGATIVE Sensitive     *  ABUNDANT METHICILLIN RESISTANT STAPHYLOCOCCUS AUREUS    Coagulation Studies: No results for input(s): LABPROT, INR in the last 72 hours.  Urinalysis: No results for input(s): COLORURINE, LABSPEC, PHURINE, GLUCOSEU, HGBUR, BILIRUBINUR, KETONESUR, PROTEINUR, UROBILINOGEN, NITRITE, LEUKOCYTESUR in the last 72  hours.  Invalid input(s): APPERANCEUR    Imaging: No results found.   Medications:    . amiodarone  200 mg Oral Daily  . aspirin  325 mg Oral Daily  . guaiFENesin  600 mg Oral BID   And  . dextromethorphan  30 mg Oral BID  . feeding supplement (NEPRO CARB STEADY)  237 mL Oral BID BM  . heparin subcutaneous  5,000 Units Subcutaneous Q8H  . insulin aspart  0-9 Units Subcutaneous TID PC & HS  . methimazole  10 mg Oral Daily  . metoprolol  50 mg Oral Daily  . pantoprazole  40 mg Oral Daily  . polyethylene glycol  17 g Oral Daily  . senna  1 tablet Oral BID  . tuberculin  5 Units Intradermal Once   acetaminophen **OR** acetaminophen, bisacodyl, HYDROcodone-acetaminophen, metoCLOPramide **OR** metoCLOPramide (REGLAN) injection, morphine injection, ondansetron **OR** ondansetron (ZOFRAN) IV  Assessment/ Plan:  Mr. Queen BlossomWaltzie L Hedtke Jr. is a 46 y.o. white male with past medical history of hypertension, insulin dependent diabetes mellitus type 2, diabetic peripheral neuropathy  1. 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   Acute renal failure from sepsis - source MRSA from septic joint.  -Urine output improving today so we are hopeful renal function may turn around. -Continue to monitor urine output and renal function closely. -Electrolytes and Volume status are acceptable No acute indication for Dialysis at present  2. Hypertension: -metoprolol  3. MRSA Knee infection Treated with iv vancomycin  4. 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

## 2016-10-25 ENCOUNTER — Encounter: Payer: Self-pay | Admitting: Internal Medicine

## 2016-10-25 ENCOUNTER — Other Ambulatory Visit: Payer: Self-pay

## 2016-10-25 LAB — CBC
HEMATOCRIT: 23.7 % — AB (ref 40.0–52.0)
HEMOGLOBIN: 8 g/dL — AB (ref 13.0–18.0)
MCH: 27.4 pg (ref 26.0–34.0)
MCHC: 33.8 g/dL (ref 32.0–36.0)
MCV: 81.1 fL (ref 80.0–100.0)
Platelets: 87 10*3/uL — ABNORMAL LOW (ref 150–440)
RBC: 2.93 MIL/uL — ABNORMAL LOW (ref 4.40–5.90)
RDW: 16.4 % — ABNORMAL HIGH (ref 11.5–14.5)
WBC: 9.7 10*3/uL (ref 3.8–10.6)

## 2016-10-25 LAB — GLUCOSE, CAPILLARY
GLUCOSE-CAPILLARY: 282 mg/dL — AB (ref 65–99)
GLUCOSE-CAPILLARY: 265 mg/dL — AB (ref 65–99)
GLUCOSE-CAPILLARY: 191 mg/dL — AB (ref 65–99)
Glucose-Capillary: 325 mg/dL — ABNORMAL HIGH (ref 65–99)

## 2016-10-25 LAB — BASIC METABOLIC PANEL
ANION GAP: 11 (ref 5–15)
BUN: 83 mg/dL — ABNORMAL HIGH (ref 6–20)
CHLORIDE: 93 mmol/L — AB (ref 101–111)
CO2: 24 mmol/L (ref 22–32)
Calcium: 8.2 mg/dL — ABNORMAL LOW (ref 8.9–10.3)
Creatinine, Ser: 4.09 mg/dL — ABNORMAL HIGH (ref 0.61–1.24)
GFR calc non Af Amer: 16 mL/min — ABNORMAL LOW (ref >60)
GFR, EST AFRICAN AMERICAN: 19 mL/min — AB (ref >60)
Glucose, Bld: 292 mg/dL — ABNORMAL HIGH (ref 65–99)
Potassium: 5 mmol/L (ref 3.5–5.1)
Sodium: 128 mmol/L — ABNORMAL LOW (ref 135–145)

## 2016-10-25 LAB — TROPONIN I
TROPONIN I: 2.02 ng/mL — AB (ref <0.03)
Troponin I: 1.87 ng/mL (ref <0.03)

## 2016-10-25 MED ORDER — SODIUM CHLORIDE 0.9% FLUSH
3.0000 mL | Freq: Two times a day (BID) | INTRAVENOUS | Status: DC
  Administered 2016-10-25 – 2016-10-26 (×2): 3 mL via INTRAVENOUS

## 2016-10-25 MED ORDER — SODIUM CHLORIDE 0.9% FLUSH: 3.0000 mL | INTRAVENOUS | Status: DC | PRN

## 2016-10-25 MED ORDER — INSULIN GLARGINE 100 UNIT/ML ~~LOC~~ SOLN
5.0000 [IU] | Freq: Every day | SUBCUTANEOUS | Status: DC
  Administered 2016-10-25: 5 [IU] via SUBCUTANEOUS
  Filled 2016-10-25 (×3): qty 0.05

## 2016-10-25 MED ORDER — ISOSORBIDE MONONITRATE ER 30 MG PO TB24
30.0000 mg | ORAL_TABLET | Freq: Every day | ORAL | Status: DC
  Administered 2016-10-25: 30 mg via ORAL
  Filled 2016-10-25: qty 1

## 2016-10-25 MED ORDER — ASPIRIN 81 MG PO CHEW
81.0000 mg | CHEWABLE_TABLET | ORAL | Status: AC
  Administered 2016-10-26: 81 mg via ORAL
  Filled 2016-10-25: qty 1

## 2016-10-25 MED ORDER — SODIUM CHLORIDE 0.9 % IV SOLN: 250.0000 mL | INTRAVENOUS | Status: DC | PRN

## 2016-10-25 MED ORDER — VANCOMYCIN HCL IN DEXTROSE 1-5 GM/200ML-% IV SOLN
1000.0000 mg | INTRAVENOUS | Status: DC | PRN
Start: 2016-10-25 — End: 2016-10-27
  Administered 2016-10-26: 1000 mg via INTRAVENOUS
  Filled 2016-10-25 (×3): qty 200

## 2016-10-25 NOTE — Progress Notes (Signed)
Patient ID: Juan BlossomWaltzie L Huxtable Jr., male   DOB: 01/18/1971, 46 y.o.   MRN: [id number]   Called by nursing regarding positive troponin of 1.74 which appears to have been an old order that was processed today.  Patient denies complaint of chest pain. Vital signs are stable.  He is medically managed with aspirin, beta blocker. We'll check chest x-ray to assess for EKG changes and consider anticoagulation however this may be demand ischemia secondary to recent sepsis in the setting of chronic kidney disease/end-stage renal disease on hemodialysis.  Monitor closely.

## 2016-10-25 NOTE — Progress Notes (Signed)
[facility]  ROUNDING NOTE   Subjective:   No acute c/o NPO for permcath placement IM team has cleared this with Dr Sampson GoonFitzgerald No N/V   Objective:  Vital signs in last 24 hours:  Temp:  [97.8 F (36.6 C)-98.1 F (36.7 C)] 97.8 F (36.6 C) (01/24 0350) Pulse Rate:  [79-84] 83 (01/24 0350) Resp:  [18-19] 18 (01/24 0350) BP: (113-115)/(63-68) 114/65 (01/24 0350) SpO2:  [98 %-100 %] 98 % (01/24 0350) Weight:  [93.7 kg (206 lb 8 oz)] 93.7 kg (206 lb 8 oz) (01/24 0351)  Weight change: 1.768 kg (3 lb 14.4 oz) Filed Weights   10/23/16 1040 10/24/16 0355 10/25/16 0351  Weight: 91.9 kg (202 lb 9.6 oz) 92.7 kg (204 lb 4.8 oz) 93.7 kg (206 lb 8 oz)    Intake/Output: I/O last 3 completed shifts: In: 240 [P.O.:240] Out: -    Intake/Output this shift:  No intake/output data recorded.  Physical Exam: General: NAD, laying in bed  Head: Normocephalic, atraumatic. Moist oral mucosal membranes. Temporal wasting  Eyes: Anicteric  Neck: Supple  Lungs:  Clear to auscultation, barrell chest  Heart: Regular rate and rhythm, +s3 gallop, no rub  Abdomen:  Soft, nontender  Extremities: trace peripheral edema, right knee drain  Neurologic: Nonfocal, moving all four extremities  Skin: No lesions, multiple tattoos  Access: Left femoral temp HD catheter 1/19 Dr. Gilda CreaseSchnier    Basic Metabolic Panel:  Recent Labs Lab 10/20/16 0805 10/21/16 [id number] 10/21/16 1012 10/22/16 [id number] 10/22/16 1125 10/23/16 0414 10/24/16 0455 10/25/16 0429  NA 125* 127*  --   --  130* 134*  --  128*  K 5.2* 5.3*  --   --  5.6* 4.7  --  5.0  CL 97* 99*  --   --  99* 101  --  93*  CO2 17* 15*  --   --  15* 19*  --  24  GLUCOSE 169* 118*  --   --  188* 168*  --  292*  BUN 131* 141*  --   --  123* 90*  --  83*  CREATININE 6.78* 7.31*  --  6.42* 6.70* 4.93* 3.56* 4.09*  CALCIUM 8.3* 8.0*  --   --  8.2* 8.2*  --  8.2*  PHOS  --   --  7.3*  --  7.4*  --   --   --     Liver Function Tests:  Recent  Labs Lab 10/22/16 1125  AST 22  ALT <5*  ALKPHOS 94  BILITOT 3.6*  PROT 5.8*  ALBUMIN 2.0*   No results for input(s): LIPASE, AMYLASE in the last 168 hours. No results for input(s): AMMONIA in the last 168 hours.  CBC:  Recent Labs Lab 10/20/16 0805 10/21/16 0339 10/21/16 1012 10/23/16 0414 10/25/16 0429  WBC 9.3 9.0 9.6 9.3 9.7  NEUTROABS  --   --  8.0*  --   --   HGB 8.3* 8.3* 8.1* 8.2* 8.0*  HCT 25.7* 25.5* 25.3* 24.2* 23.7*  MCV 82.3 82.9 83.5 80.3 81.1  PLT 74* 76* 80* 86* 87*    Cardiac Enzymes:  Recent Labs Lab 10/24/16 2233 10/25/16 0429  TROPONINI 1.74* 1.87*    BNP: Invalid input(s): POCBNP  CBG:  Recent Labs Lab 10/24/16 0826 10/24/16 1058 10/24/16 1618 10/24/16 2130 10/25/16 0734  GLUCAP 186* 250* 315* 346* 282*    Microbiology: Results for orders placed or performed during the hospital encounter of 10/17/16  Blood culture (routine x 2)  Status: Abnormal   Collection Time: 10/17/16  5:11 PM  Result Value Ref Range Status   Specimen Description BLOOD LEFT HAND  Final   Special Requests   Final    BOTTLES DRAWN AEROBIC AND ANAEROBIC AER ANA   Culture  Setup Time   Final    GRAM POSITIVE COCCI IN BOTH AEROBIC AND ANAEROBIC BOTTLES CRITICAL RESULT CALLED TO, READ BACK BY AND VERIFIED WITH: CHRISTINE KATSOUDAS 10/18/16 1610 SGD Performed at [facility], [address], [place], [place] [address]    Culture METHICILLIN RESISTANT STAPHYLOCOCCUS AUREUS (A)  Final   Report Status 10/20/2016 FINAL  Final   Organism ID, Bacteria METHICILLIN RESISTANT STAPHYLOCOCCUS AUREUS  Final      Susceptibility   Methicillin resistant staphylococcus aureus - MIC*    CIPROFLOXACIN >=8 RESISTANT Resistant     ERYTHROMYCIN >=8 RESISTANT Resistant     GENTAMICIN <=0.5 SENSITIVE Sensitive     OXACILLIN 2 RESISTANT Resistant     TETRACYCLINE <=1 SENSITIVE Sensitive     VANCOMYCIN <=0.5 SENSITIVE Sensitive     TRIMETH/SULFA <=10 SENSITIVE  Sensitive     CLINDAMYCIN <=0.25 SENSITIVE Sensitive     RIFAMPIN <=0.5 SENSITIVE Sensitive     Inducible Clindamycin NEGATIVE Sensitive     * METHICILLIN RESISTANT STAPHYLOCOCCUS AUREUS  Blood Culture ID Panel (Reflexed)     Status: Abnormal   Collection Time: 10/17/16  5:11 PM  Result Value Ref Range Status   Enterococcus species NOT DETECTED NOT DETECTED Final   Listeria monocytogenes NOT DETECTED NOT DETECTED Final   Staphylococcus species DETECTED (A) NOT DETECTED Final    Comment: CRITICAL RESULT CALLED TO, READ BACK BY AND VERIFIED WITH: CHRISTINE KATSOUDAS 10/18/16 0925 SGD    Staphylococcus aureus DETECTED (A) NOT DETECTED Final    Comment: CRITICAL RESULT CALLED TO, READ BACK BY AND VERIFIED WITH: CHRISTINE KATSOUDAS 10/18/16 0925 SGD    Methicillin resistance DETECTED (A) NOT DETECTED Final    Comment: CRITICAL RESULT CALLED TO, READ BACK BY AND VERIFIED WITH: CHRISTINE KATSOUDAS 10/18/16 0925 SGD    Streptococcus species NOT DETECTED NOT DETECTED Final   Streptococcus agalactiae NOT DETECTED NOT DETECTED Final   Streptococcus pneumoniae NOT DETECTED NOT DETECTED Final   Streptococcus pyogenes NOT DETECTED NOT DETECTED Final   Acinetobacter baumannii NOT DETECTED NOT DETECTED Final   Enterobacteriaceae species NOT DETECTED NOT DETECTED Final   Enterobacter cloacae complex NOT DETECTED NOT DETECTED Final   Escherichia coli NOT DETECTED NOT DETECTED Final   Klebsiella oxytoca NOT DETECTED NOT DETECTED Final   Klebsiella pneumoniae NOT DETECTED NOT DETECTED Final   Proteus species NOT DETECTED NOT DETECTED Final   Serratia marcescens NOT DETECTED NOT DETECTED Final   Haemophilus influenzae NOT DETECTED NOT DETECTED Final   Neisseria meningitidis NOT DETECTED NOT DETECTED Final   Pseudomonas aeruginosa NOT DETECTED NOT DETECTED Final   Candida albicans NOT DETECTED NOT DETECTED Final   Candida glabrata NOT DETECTED NOT DETECTED Final   Candida krusei NOT DETECTED NOT  DETECTED Final   Candida parapsilosis NOT DETECTED NOT DETECTED Final   Candida tropicalis NOT DETECTED NOT DETECTED Final  Body fluid culture     Status: None   Collection Time: 10/17/16  6:22 PM  Result Value Ref Range Status   Specimen Description Right Knee  Final   Special Requests NONE  Final   Gram Stain   Final    MODERATE WBC PRESENT, PREDOMINANTLY PMN FEW GRAM POSITIVE COCCI IN PAIRS  Culture   Final    ABUNDANT METHICILLIN RESISTANT STAPHYLOCOCCUS AUREUS NO ANAEROBES ISOLATED Performed at [facility], [address], [place], [place] [address]    Report Status 10/20/2016 FINAL  Final   Organism ID, Bacteria METHICILLIN RESISTANT STAPHYLOCOCCUS AUREUS  Final      Susceptibility   Methicillin resistant staphylococcus aureus - MIC*    CIPROFLOXACIN >=8 RESISTANT Resistant     ERYTHROMYCIN >=8 RESISTANT Resistant     GENTAMICIN <=0.5 SENSITIVE Sensitive     OXACILLIN 2 RESISTANT Resistant     TETRACYCLINE <=1 SENSITIVE Sensitive     VANCOMYCIN <=0.5 SENSITIVE Sensitive     TRIMETH/SULFA <=10 SENSITIVE Sensitive     CLINDAMYCIN <=0.25 SENSITIVE Sensitive     RIFAMPIN <=0.5 SENSITIVE Sensitive     Inducible Clindamycin NEGATIVE Sensitive     * ABUNDANT METHICILLIN RESISTANT STAPHYLOCOCCUS AUREUS  Blood culture (routine x 2)     Status: None   Collection Time: 10/17/16  8:37 PM  Result Value Ref Range Status   Specimen Description BLOOD RIGHT ARM  Final   Special Requests BOTTLES DRAWN AEROBIC AND ANAEROBIC  9CC  Final   Culture NO GROWTH 5 DAYS  Final   Report Status 10/22/2016 FINAL  Final  MRSA PCR Screening     Status: Abnormal   Collection Time: 10/17/16  8:50 PM  Result Value Ref Range Status   MRSA by PCR POSITIVE (A) NEGATIVE Final    Comment:        The GeneXpert MRSA Assay (FDA approved for NASAL specimens only), is one component of a comprehensive MRSA colonization surveillance program. It is not intended to diagnose MRSA infection nor to  guide or monitor treatment for MRSA infections. RESULT CALLED TO, READ BACK BY AND VERIFIED WITH: VIVIAN ALI AT 2242 ON 10/17/2016 JLJ   Culture, blood (single) w Reflex to ID Panel     Status: None   Collection Time: 10/18/16 11:36 AM  Result Value Ref Range Status   Specimen Description BLOOD L HAND  Final   Special Requests BOTTLES DRAWN AEROBIC AND ANAEROBIC  Final   Culture NO GROWTH 5 DAYS  Final   Report Status 10/23/2016 FINAL  Final  Aerobic/Anaerobic Culture (surgical/deep wound)     Status: None   Collection Time: 10/18/16  9:18 PM  Result Value Ref Range Status   Specimen Description KNEE  Final   Special Requests NONE  Final   Gram Stain   Final    ABUNDANT WBC PRESENT, PREDOMINANTLY PMN MODERATE GRAM POSITIVE COCCI IN PAIRS IN CLUSTERS    Culture   Final    ABUNDANT METHICILLIN RESISTANT STAPHYLOCOCCUS AUREUS NO ANAEROBES ISOLATED Performed at [facility], [address] [place], [place] [address]    Report Status 10/24/2016 FINAL  Final   Organism ID, Bacteria METHICILLIN RESISTANT STAPHYLOCOCCUS AUREUS  Final      Susceptibility   Methicillin resistant staphylococcus aureus - MIC*    CIPROFLOXACIN >=8 RESISTANT Resistant     ERYTHROMYCIN >=8 RESISTANT Resistant     GENTAMICIN <=0.5 SENSITIVE Sensitive     OXACILLIN 2 RESISTANT Resistant     TETRACYCLINE <=1 SENSITIVE Sensitive     VANCOMYCIN 1 SENSITIVE Sensitive     TRIMETH/SULFA <=10 SENSITIVE Sensitive     CLINDAMYCIN <=0.25 SENSITIVE Sensitive     RIFAMPIN <=0.5 SENSITIVE Sensitive     Inducible Clindamycin NEGATIVE Sensitive     * ABUNDANT METHICILLIN RESISTANT STAPHYLOCOCCUS AUREUS  Coagulation Studies: No results for input(s): LABPROT, INR in the last 72 hours.  Urinalysis: No results for input(s): COLORURINE, LABSPEC, PHURINE, GLUCOSEU, HGBUR, BILIRUBINUR, KETONESUR, PROTEINUR, UROBILINOGEN, NITRITE, LEUKOCYTESUR in the last 72 hours.  Invalid input(s): APPERANCEUR     Imaging: No results found.   Medications:    . amiodarone  200 mg Oral Daily  . aspirin  325 mg Oral Daily  . guaiFENesin  600 mg Oral BID   And  . dextromethorphan  30 mg Oral BID  . feeding supplement (NEPRO CARB STEADY)  237 mL Oral BID BM  . heparin subcutaneous  5,000 Units Subcutaneous Q8H  . insulin aspart  0-9 Units Subcutaneous TID PC & HS  . methimazole  10 mg Oral Daily  . metoprolol  50 mg Oral Daily  . pantoprazole  40 mg Oral Daily  . polyethylene glycol  17 g Oral Daily  . senna  1 tablet Oral BID  . tuberculin  5 Units Intradermal Once   acetaminophen **OR** acetaminophen, bisacodyl, HYDROcodone-acetaminophen, metoCLOPramide **OR** metoCLOPramide (REGLAN) injection, morphine injection, ondansetron **OR** ondansetron (ZOFRAN) IV  Assessment/ Plan:  [name] is a 46 y.o. white male with past medical history of hypertension, insulin dependent diabetes mellitus type 2, diabetic peripheral neuropathy  1. 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  Labs are worse today and UOP remains poor  Acute renal failure from sepsis - source MRSA from septic joint.  - Patient has a new finding of severe CHF. May need cardiac cath which means exposure to iv contrast dye. This was discussed with patient that it might decrease chances of renal recovery but cardiac evaluation is also critical - He is getting close to ESRD - So, will continue hemodialysis 3 times per week - discharge planning for outpatient unit is in progress - Patient is on schedule for permcath placement today - will await cardiac evaluation - Dialysis later today or tomorrow depending on timing of permcath placement - may remove temporary dialysis cathter after permcath is placed  2. Hypertension: -metoprolol  3. MRSA Knee  infection Treated with iv vancomycin  4. 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 Chronic thrombocytopenia Noted off and on since Oct 2017    LOS: 8 Juan Roberson 1/24/20189:40 AM

## 2016-10-25 NOTE — Progress Notes (Signed)
[employer] - [place] [facility]   PATIENT NAME: Juan Roberson    MR#:  [id number]  DATE OF BIRTH:  03/01/1971  SUBJECTIVE:  CHIEF COMPLAINT:   Chief Complaint  Patient presents with  . Influenza  . Abdominal Pain  . Knee Pain  still having lots of knee pain and requesting pain meds, unable to bear weight still. BP is running low, and have gradually worsening renal function.  pt also appears completely alert and oriented today.   he had temporary catheter and started HD . PPD placed.  He did not have any chest pain, but as there was an old order- troponin was collected and it was around 2, so cardio consult is called. As per recent echo- EF is 20%. REVIEW OF SYSTEMS:  Review of Systems  Constitutional: Negative for chills, fever and weight loss.  HENT: Negative for nosebleeds and sore throat.   Eyes: Negative for blurred vision.  Respiratory: Negative for cough, shortness of breath and wheezing.   Cardiovascular: Negative for chest pain, orthopnea, leg swelling and PND.  Gastrointestinal: Negative for abdominal pain, constipation, diarrhea, heartburn, nausea and vomiting.  Genitourinary: Negative for dysuria and urgency.  Musculoskeletal: Positive for joint pain. Negative for back pain.  Skin: Negative for rash.  Neurological: Negative for dizziness, speech change, focal weakness and headaches.  Endo/Heme/Allergies: Does not bruise/bleed easily.  Psychiatric/Behavioral: Negative for depression.    DRUG ALLERGIES:   Allergies  Allergen Reactions  . Sulfur Other (See Comments)    Pt states that this medication causes renal failure.     VITALS:  Blood pressure (!) 97/53, pulse 77, temperature 97.7 F (36.5 C), temperature source Oral, resp. rate 18, height 6\' 2"  (1.88 m), weight 93.7 kg (206 lb 8 oz), SpO2 98 %. PHYSICAL EXAMINATION:  Physical Exam  Constitutional: He is oriented to person, place, and time and well-developed, well-nourished, and in no  distress.  HENT:  Head: Normocephalic and atraumatic.  Eyes: Conjunctivae and EOM are normal. Pupils are equal, round, and reactive to light.  Neck: Normal range of motion. Neck supple. No tracheal deviation present. No thyromegaly present.  Cardiovascular: Normal rate, regular rhythm and normal heart sounds.   Pulmonary/Chest: Effort normal and breath sounds normal. No respiratory distress. He has no wheezes. He exhibits no tenderness.  Abdominal: Soft. Bowel sounds are normal. He exhibits no distension. There is no tenderness.  Musculoskeletal:       Right knee: He exhibits decreased range of motion and effusion. Tenderness found.  Neurological: He is alert and oriented to person, place, and time. No cranial nerve deficit.  Skin: Skin is warm and dry. No rash noted.  Psychiatric: Mood and affect normal.   LABORATORY PANEL:   CBC  Recent Labs Lab 10/25/16 0429  WBC 9.7  HGB 8.0*  HCT 23.7*  PLT 87*   ------------------------------------------------------------------------------------------------------------------ Chemistries   Recent Labs Lab 10/22/16 1125  10/25/16 0429  NA 130*  < > 128*  K 5.6*  < > 5.0  CL 99*  < > 93*  CO2 15*  < > 24  GLUCOSE 188*  < > 292*  BUN 123*  < > 83*  CREATININE 6.70*  < > 4.09*  CALCIUM 8.2*  < > 8.2*  AST 22  --   --   ALT <5*  --   --   ALKPHOS 94  --   --   BILITOT 3.6*  --   --   < > =  values in this interval not displayed. RADIOLOGY:  No results found. ASSESSMENT AND PLAN:  Patient is a 46 year old white male recently diagnosed with the flu now presents with sepsis  * MRSA Bacteremia, Sepsis: present on admission. Due to septic joint - continue IV vancomycin- will need total 6 weeks from 10/18/16 - reviewed fluid c/s  Now with renal failure and need for HD, we may need to wait for final outcome and adjust Abx accordingly.  will need HD as out pt.   We can place permacath now as repeat cx negative.   Vascular want to have  cardiac procedure first before going for permacath.  * Septic Rt knee - Irrigation and debridement s/p arthoscopy on 1/17- reviewed cx result.  * Elevted troponin   Went up to 2, cardio consult.   Likely combination of Infection, CKD, Infection and CHF.   As per cardio- cath tomorrow.  * CH systolic CHF   EF- 20%, monitor.  * Hyponatremia: - IVFs and monitor- improved.  * Influenza: Given one dose tamiflu- but improved now.  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  nephro suggest to continue HD as out pt.   Get permacath and work on d/c  * Diarrhea Resolved, could be viral  * Diabetes type 2 - continue sliding scale insulin - continue lantus and monitor  * History of DVT: no longer taking coumadin (his PCP asked him to stop)  * hypotension   Stopped clonidine patch.   Now stable.   All the records are reviewed and case discussed with Care Management/Social Worker. Management plans discussed with the patient, family (girlfriend at bedside), Nursing and they are in agreement.  CODE STATUS: FULL CODE  TOTAL TIME TAKING CARE OF THIS PATIENT: 35 minutes.   More than 50% of the time was spent in counseling/coordination of care: YES  POSSIBLE D/C IN 1-2 DAYS, DEPENDING ON CLINICAL CONDITION.   Altamese DillingVACHHANI, Chane Cowden M.D on 10/25/2016 at 5:35 PM  Between 7am to 6pm - Pager - [phone]  After 6pm go to [email or web address] - Social research officer, governmentpassword EPAS [facility]  [facility] [place] [facility]  Office  [phone]  CC: Primary care physician; Corky DownsMASOUD,JAVED, MD  Note: This dictation was prepared with Dragon dictation along with smaller phrase technology. Any transcriptional errors that result from this process are unintentional.

## 2016-10-25 NOTE — Progress Notes (Signed)
Inpatient Diabetes Program Recommendations  AACE/ADA: New Consensus Statement on Inpatient Glycemic Control (2015)  Target Ranges:  Prepandial:   less than 140 mg/dL      Peak postprandial:   less than 180 mg/dL (1-2 hours)      Critically ill patients:  140 - 180 mg/dL   Results for [name], Juan L JR. (MRN [id number]) as of 10/25/2016 10:48  Ref. Range 10/24/2016 08:26 10/24/2016 10:58 10/24/2016 16:18 10/24/2016 21:30 10/25/2016 07:34  Glucose-Capillary Latest Ref Range: 65 - 99 mg/dL 865186 (H) 784250 (H) 696315 (H) 346 (H) 282 (H)   Review of Glycemic Control  Current orders for Inpatient glycemic control: Novolog 0-9 units ACHS  Inpatient Diabetes Program Recommendations:  Insulin - Basal: Glucose ranged from 186-344 mg/dl on 2/95/281/23/18 and fasting glucose 282 mg/dl today. Please consider ordering Levemir 5 units QHS. Insulin - Meal Coverage: When diet is resumed, please consider ordering Novolog 3 units TID with meals for meal coverage if patient eats at least 50% of meals.  Thanks, Orlando PennerMarie Florian Chauca, RN, MSN, CDE Diabetes Coordinator Inpatient Diabetes Program [phone] (Team Pager from 8am to 5pm)

## 2016-10-25 NOTE — Progress Notes (Signed)
Late morning blood draw showed Trop level of 2.02, which was greater than previous value.  MD notified. .[name]

## 2016-10-25 NOTE — Consult Note (Signed)
Juan Malek. is a 46 y.o. male  [id number]  Primary Cardiologist: Dr. Adrian Blackwater Reason for Consultation: Elevated Troponin  HPI: 45yo white male admitted for fever, dehydration and flu like symptoms. Developed sepsis of the knee which is complicated by kidney failure. Troponin was revealed to be elevated today and ejection fraction was severely reduced at 20-25%.    Review of Systems: No chest pain or dyspnea. Knee pain and swelling reported post surgery.    Past Medical History:  Diagnosis Date  . Cellulitis and abscess of foot    Left-Dr. Ether Griffins  . CHF (congestive heart failure) (HCC)    EF 20%  . CKD (chronic kidney disease), stage III   . Diabetes mellitus without complication (HCC)    a. Dx ~ 1996.  [name] Dysrhythmia   . Essential hypertension   . Gastritis    a. 04/2015 hematemesis -> EGD: gastritis, esophagitis, duodenitis.  No active bleeding.  PPI added.  [name] GERD (gastroesophageal reflux disease)   . Left leg DVT (HCC)    a. Dx 05/2015 -> Coumadin.  . MI (myocardial infarction)   . Osteomyelitis (HCC)    a. 05/2015 L foot.    Medications Prior to Admission  Medication Sig Dispense Refill  . alum & mag hydroxide-simeth (MAALOX/MYLANTA) 200-200-20 MG/5ML suspension Take 30 mLs by mouth every 6 (six) hours as needed for indigestion or heartburn. 355 mL 0  . amiodarone (PACERONE) 200 MG tablet Take 1 tablet (200 mg total) by mouth daily. 30 tablet 2  . aspirin EC 81 MG EC tablet Take 1 tablet (81 mg total) by mouth daily. (Patient taking differently: Take 325 mg by mouth daily. ) 30 tablet 0  . cloNIDine (CATAPRES - DOSED IN MG/24 HR) 0.3 mg/24hr patch Place 0.3 mg onto the skin once a week.    . cloNIDine (CATAPRES) 0.2 MG tablet Take 1 tablet (0.2 mg total) by mouth 2 (two) times daily. (Patient taking differently: Take 0.2 mg by mouth 2 (two) times daily. PT TAKES 1 TAB EVERY MORNING, AND 2 AT BEDTIME)    . furosemide (LASIX) 40 MG tablet Take 40 mg by mouth  daily.  5  . methimazole (TAPAZOLE) 10 MG tablet Take 1 tablet (10 mg total) by mouth daily. 30 tablet 0  . metoprolol (LOPRESSOR) 50 MG tablet Take 50 mg by mouth daily.  5  . ondansetron (ZOFRAN ODT) 4 MG disintegrating tablet Take 1 tablet (4 mg total) by mouth every 8 (eight) hours as needed for nausea or vomiting. 20 tablet 0  . oseltamivir (TAMIFLU) 30 MG capsule Take 1 capsule (30 mg total) by mouth daily. 5 capsule 0  . pantoprazole (PROTONIX) 40 MG tablet Take 1 tablet (40 mg total) by mouth daily. 30 tablet 0  . TOUJEO SOLOSTAR 300 UNIT/ML SOPN Inject 25 Units into the skin every morning.   7  . chlorpheniramine-HYDROcodone (TUSSIONEX PENNKINETIC ER) 10-8 MG/5ML SUER Take 5 mLs by mouth 2 (two) times daily. (Patient not taking: Reported on 10/17/2016) 140 mL 0  . feeding supplement, GLUCERNA SHAKE, (GLUCERNA SHAKE) LIQD Take 237 mLs by mouth 2 (two) times daily between meals. (Patient not taking: Reported on 09/24/2016) 60 Can 0  . triamcinolone cream (KENALOG) 0.1 % Apply twice a day to skin rash (Patient not taking: Reported on 09/24/2016) 30 g 0     . amiodarone  200 mg Oral Daily  . aspirin  325 mg Oral Daily  . guaiFENesin  600 mg  Oral BID   And  . dextromethorphan  30 mg Oral BID  . feeding supplement (NEPRO CARB STEADY)  237 mL Oral BID BM  . heparin subcutaneous  5,000 Units Subcutaneous Q8H  . insulin aspart  0-9 Units Subcutaneous TID PC & HS  . methimazole  10 mg Oral Daily  . metoprolol  50 mg Oral Daily  . pantoprazole  40 mg Oral Daily  . polyethylene glycol  17 g Oral Daily  . senna  1 tablet Oral BID  . tuberculin  5 Units Intradermal Once    Infusions:   Allergies  Allergen Reactions  . Sulfur Other (See Comments)    Pt states that this medication causes renal failure.      Social History   Social History  . Marital status: Single    Spouse name: N/A  . Number of children: 3  . Years of education: N/A   Occupational History  . Not on file.    Social History Main Topics  . Smoking status: Never Smoker  . Smokeless tobacco: Never Used  . Alcohol use No  . Drug use: No  . Sexual activity: No   Other Topics Concern  . Not on file   Social History Narrative   Single, lives alone, 3 children    Family History  Problem Relation Age of Onset  . Coronary artery disease Father   . Pancreatic cancer Mother   . Breast cancer Sister   . Lung cancer Brother   . Cervical cancer Sister     PHYSICAL EXAM: Vitals:   10/24/16 2036 10/25/16 0350  BP: 113/68 114/65  Pulse: 84 83  Resp: 19 18  Temp: 98.1 F (36.7 C) 97.8 F (36.6 C)    No intake or output data in the 24 hours ending 10/25/16 0912  General:  Well appearing. No respiratory difficulty HEENT: normal Neck: supple. no JVD. Carotids 2+ bilat; no bruits. No lymphadenopathy or thryomegaly appreciated. Cor: PMI nondisplaced. Regular rate & rhythm. No rubs, gallops or murmurs. Lungs: clear Abdomen: soft, nontender, nondistended. No hepatosplenomegaly. No bruits or masses. Good bowel sounds. Extremities: no cyanosis, clubbing, rash, edema Neuro: alert & oriented x 3, cranial nerves grossly intact. moves all 4 extremities w/o difficulty. Affect pleasant.  ECG: Atrial flutter with right bundle branch block, and evidence of prior MI.  Results for orders placed or performed during the hospital encounter of 10/17/16 (from the past 24 hour(s))  Vancomycin, random     Status: None   Collection Time: 10/24/16 10:28 AM  Result Value Ref Range   Vancomycin Rm 23   Glucose, capillary     Status: Abnormal   Collection Time: 10/24/16 10:58 AM  Result Value Ref Range   Glucose-Capillary 250 (H) 65 - 99 mg/dL  Glucose, capillary     Status: Abnormal   Collection Time: 10/24/16  4:18 PM  Result Value Ref Range   Glucose-Capillary 315 (H) 65 - 99 mg/dL  Glucose, capillary     Status: Abnormal   Collection Time: 10/24/16  9:30 PM  Result Value Ref Range   Glucose-Capillary  346 (H) 65 - 99 mg/dL  Troponin I (q 6hr x 3)     Status: Abnormal   Collection Time: 10/24/16 10:33 PM  Result Value Ref Range   Troponin I 1.74 (HH) <0.03 ng/mL  CBC     Status: Abnormal   Collection Time: 10/25/16  4:29 AM  Result Value Ref Range   WBC 9.7 3.8 -  10.6 K/uL   RBC 2.93 (L) 4.40 - 5.90 MIL/uL   Hemoglobin 8.0 (L) 13.0 - 18.0 g/dL   HCT 08.623.7 (L) 57.840.0 - 46.952.0 %   MCV 81.1 80.0 - 100.0 fL   MCH 27.4 26.0 - 34.0 pg   MCHC 33.8 32.0 - 36.0 g/dL   RDW 62.916.4 (H) 52.811.5 - 41.314.5 %   Platelets 87 (L) 150 - 440 K/uL  Basic metabolic panel     Status: Abnormal   Collection Time: 10/25/16  4:29 AM  Result Value Ref Range   Sodium 128 (L) 135 - 145 mmol/L   Potassium 5.0 3.5 - 5.1 mmol/L   Chloride 93 (L) 101 - 111 mmol/L   CO2 24 22 - 32 mmol/L   Glucose, Bld 292 (H) 65 - 99 mg/dL   BUN 83 (H) 6 - 20 mg/dL   Creatinine, Ser 2.444.09 (H) 0.61 - 1.24 mg/dL   Calcium 8.2 (L) 8.9 - 10.3 mg/dL   GFR calc non Af Amer 16 (L) >60 mL/min   GFR calc Af Amer 19 (L) >60 mL/min   Anion gap 11 5 - 15  Troponin I (q 6hr x 3)     Status: Abnormal   Collection Time: 10/25/16  4:29 AM  Result Value Ref Range   Troponin I 1.87 (HH) <0.03 ng/mL  Glucose, capillary     Status: Abnormal   Collection Time: 10/25/16  7:34 AM  Result Value Ref Range   Glucose-Capillary 282 (H) 65 - 99 mg/dL   No results found.   ASSESSMENT AND PLAN: Cardiomyopathy with severe LV dysfunction (EF 20-25%). Discussed with nephrology and is getting dialysis, and has classification of ESRD. He was scheduled to get a perm-cath but due to elevated troponins got postponed so will do tomorrow cardiac catheterization.   Juan Roberson

## 2016-10-25 NOTE — Progress Notes (Signed)
Elevated troponin prime doc paged.

## 2016-10-25 NOTE — Progress Notes (Signed)
Hemodialysis-Confirmed with Dr. Thedore MinsSingh that patient will have dialysis tomorrow after cardiac procedure and not today as previously ordered.

## 2016-10-25 NOTE — Consult Note (Signed)
Pharmacy Antibiotic Note  Juan BlossomWaltzie L Krise Jr. is a 46 y.o. male admitted on 10/17/2016 with MRSA bacteremia, septic knee, possible PNA.  Pharmacy has been consulted for vancomycin and zosyn dosing. Zosyn d/c 10/20/16.  Plan 1/22 HD again today. According to nurse they have been doing full HD- will order vancomycin 1gm IV x1 now. Vanc random level ordered tomorrow AM.   1/23 No HD planned for today. Random Vanc level 23. (Goal: 20-25). No dose ordered for today.  1/24: Patient scheduled for perm cath placement today. Possible HD this evening. Will need to make sure Vancomycin 1gm IV is administered following HD.     Height: 6\' 2"  (188 cm) Weight: 206 lb 8 oz (93.7 kg) IBW/kg (Calculated) : 82.2  Temp (24hrs), Avg:98.2 F (36.8 C), Min:97.8 F (36.6 C), Max:98.6 F (37 C)   Recent Labs Lab 10/20/16 0805 10/21/16 0339 10/21/16 1012 10/22/16 0412 10/22/16 1125 10/23/16 0414 10/24/16 0455 10/24/16 1028 10/25/16 0429  WBC 9.3 9.0 9.6  --   --  9.3  --   --  9.7  CREATININE 6.78* 7.31*  --  6.42* 6.70* 4.93* 3.56*  --  4.09*  VANCORANDOM  --  27  --   --   --   --   --  23  --     Estimated Creatinine Clearance: 26 mL/min (by C-G formula based on SCr of 4.09 mg/dL (H)).    Allergies  Allergen Reactions  . Sulfur Other (See Comments)    Pt states that this medication causes renal failure.      Antimicrobials this admission: vancomycin 1/16 >>  zosyn 1/16 >> 1/19  Dose adjustments this admission:   Microbiology results: 1/16 BCx: MRSA 2/4 so far 1/16 MRSA PCR: positive Joint fluid: few gram positive cocci in pair  Thank you for allowing pharmacy to be a part of this patient's care.  Gardner CandleSheema M Zamier Eggebrecht, PharmD, BCPS Clinical Pharmacist 10/25/2016 12:22 PM

## 2016-10-25 NOTE — Progress Notes (Signed)
OT Cancellation Note  Patient Details Name: Juan BlossomWaltzie L Mccants Jr. MRN: [id number] DOB: 03/17/1971   Cancelled Treatment:    Reason Eval/Treat Not Completed: Patient not medically ready Patient noted with elevated troponin this date, pending cardiology consult and repeat troponin levels.  Anticipating possible perm-cath placement this date.  Will hold at this time and re-attempt at later time/date as medically appropriate.  Susanne BordersSusan Aurore Redinger, OTR/L ascom [phone] 10/25/16, 1:27 PM

## 2016-10-25 NOTE — Progress Notes (Signed)
PT Cancellation Note  Patient Details Name: Juan BlossomWaltzie L Gunnoe Jr. MRN: [id number] DOB: 06/05/1971   Cancelled Treatment:    Reason Eval/Treat Not Completed: Patient not medically ready (Patient noted with elevated troponin this date, pending cardiology consult and repeat troponin levels.  Anticipating possible perm-cath placement this date.  Will hold at this time and re-attempt at later time/date as medically appropriate.)   Jheri Mitter H. Manson PasseyBrown, PT, DPT, NCS 10/25/16, 9:03 AM [phone]

## 2016-10-25 NOTE — Progress Notes (Signed)
[facility] they cannot administer the IV Abx via perm cath and will have to be through a picc line. RN case manager aware of above. Clinical Social Worker (CSW) will continue to follow and assist as needed.   Baker Hughes IncorporatedBailey Carleah Yablonski, LCSW [phone]

## 2016-10-25 NOTE — Progress Notes (Signed)
Spoke with Dr. Jerline PainHugelmyere pt with troponin 1.74.

## 2016-10-26 ENCOUNTER — Encounter
Admission: EM | Disposition: A | Discharge status: Skilled Nursing Facility | Payer: Self-pay | Source: Home / Self Care | Attending: Internal Medicine

## 2016-10-26 HISTORY — PX: CARDIAC CATHETERIZATION: SHX172

## 2016-10-26 LAB — BASIC METABOLIC PANEL
ANION GAP: 9 (ref 5–15)
BUN: 99 mg/dL — AB (ref 6–20)
CALCIUM: 7.9 mg/dL — AB (ref 8.9–10.3)
CO2: 24 mmol/L (ref 22–32)
Chloride: 93 mmol/L — ABNORMAL LOW (ref 101–111)
Creatinine, Ser: 4.6 mg/dL — ABNORMAL HIGH (ref 0.61–1.24)
GFR calc Af Amer: 16 mL/min — ABNORMAL LOW (ref >60)
GFR, EST NON AFRICAN AMERICAN: 14 mL/min — AB (ref >60)
Glucose, Bld: 287 mg/dL — ABNORMAL HIGH (ref 65–99)
POTASSIUM: 5.1 mmol/L (ref 3.5–5.1)
SODIUM: 126 mmol/L — AB (ref 135–145)

## 2016-10-26 LAB — CBC
HCT: 23.7 % — ABNORMAL LOW (ref 40.0–52.0)
Hemoglobin: 7.8 g/dL — ABNORMAL LOW (ref 13.0–18.0)
MCH: 27 pg (ref 26.0–34.0)
MCHC: 33 g/dL (ref 32.0–36.0)
MCV: 81.8 fL (ref 80.0–100.0)
PLATELETS: 98 10*3/uL — AB (ref 150–440)
RBC: 2.9 MIL/uL — AB (ref 4.40–5.90)
RDW: 16.5 % — AB (ref 11.5–14.5)
WBC: 10.6 10*3/uL (ref 3.8–10.6)

## 2016-10-26 LAB — GLUCOSE, CAPILLARY
Glucose-Capillary: 266 mg/dL — ABNORMAL HIGH (ref 65–99)
Glucose-Capillary: 254 mg/dL — ABNORMAL HIGH (ref 65–99)
Glucose-Capillary: 204 mg/dL — ABNORMAL HIGH (ref 65–99)

## 2016-10-26 SURGERY — LEFT HEART CATH AND CORONARY ANGIOGRAPHY
Anesthesia: Moderate Sedation | Laterality: Right

## 2016-10-26 MED ORDER — ONDANSETRON HCL 4 MG/2ML IJ SOLN: 4.0000 mg | Freq: Four times a day (QID) | INTRAMUSCULAR | Status: DC | PRN

## 2016-10-26 MED ORDER — FENTANYL CITRATE (PF) 100 MCG/2ML IJ SOLN
INTRAMUSCULAR | Status: AC
  Filled 2016-10-26: qty 2

## 2016-10-26 MED ORDER — SODIUM CHLORIDE 0.9% FLUSH: 3.0000 mL | INTRAVENOUS | Status: DC | PRN

## 2016-10-26 MED ORDER — SODIUM CHLORIDE 0.9 % WEIGHT BASED INFUSION: 1.0000 mL/kg/h | INTRAVENOUS | Status: DC

## 2016-10-26 MED ORDER — SODIUM CHLORIDE 0.9 % IV SOLN: 250.0000 mL | INTRAVENOUS | Status: DC | PRN

## 2016-10-26 MED ORDER — MIDAZOLAM HCL 2 MG/2ML IJ SOLN
INTRAMUSCULAR | Status: AC
  Filled 2016-10-26: qty 2

## 2016-10-26 MED ORDER — SODIUM CHLORIDE 0.9% FLUSH
3.0000 mL | Freq: Two times a day (BID) | INTRAVENOUS | Status: DC
  Administered 2016-10-26: 3 mL via INTRAVENOUS

## 2016-10-26 MED ORDER — HEPARIN (PORCINE) IN NACL 2-0.9 UNIT/ML-% IJ SOLN
INTRAMUSCULAR | Status: AC
  Filled 2016-10-26: qty 500

## 2016-10-26 MED ORDER — ACETAMINOPHEN 325 MG PO TABS: 650.0000 mg | ORAL_TABLET | ORAL | Status: DC | PRN

## 2016-10-26 MED ORDER — SODIUM CHLORIDE 0.9 % WEIGHT BASED INFUSION: 1.0000 mL/kg/h | INTRAVENOUS | Status: AC

## 2016-10-26 MED ORDER — MIDAZOLAM HCL 2 MG/2ML IJ SOLN
INTRAMUSCULAR | Status: DC | PRN
  Administered 2016-10-26: 1 mg via INTRAVENOUS

## 2016-10-26 MED ORDER — SODIUM CHLORIDE 0.9 % IV SOLN
INTRAVENOUS | Status: DC
  Administered 2016-10-26: 13:00:00 via INTRAVENOUS

## 2016-10-26 MED ORDER — IOPAMIDOL (ISOVUE-300) INJECTION 61%
INTRAVENOUS | Status: DC | PRN
  Administered 2016-10-26: 100 mL via INTRA_ARTERIAL

## 2016-10-26 MED ORDER — SODIUM CHLORIDE 0.9 % WEIGHT BASED INFUSION: 3.0000 mL/kg/h | INTRAVENOUS | Status: DC

## 2016-10-26 MED ORDER — FENTANYL CITRATE (PF) 100 MCG/2ML IJ SOLN
INTRAMUSCULAR | Status: DC | PRN
  Administered 2016-10-26: 25 ug via INTRAVENOUS

## 2016-10-26 MED ORDER — INSULIN GLARGINE 100 UNIT/ML ~~LOC~~ SOLN
9.0000 [IU] | Freq: Every day | SUBCUTANEOUS | Status: DC
  Administered 2016-10-26: 9 [IU] via SUBCUTANEOUS
  Filled 2016-10-26 (×2): qty 0.09

## 2016-10-26 SURGICAL SUPPLY — 10 items
CATH 5FR PIGTAIL DIAGNOSTIC (CATHETERS) ×3 IMPLANT
CATH INFINITI 5 FR 3DRC (CATHETERS) ×3 IMPLANT
CATH INFINITI 5FR JL4 (CATHETERS) ×3 IMPLANT
CATH INFINITI JR4 5F (CATHETERS) ×3 IMPLANT
DEVICE CLOSURE MYNXGRIP 5F (Vascular Products) ×3 IMPLANT
KIT MANI 3VAL PERCEP (MISCELLANEOUS) ×3 IMPLANT
NEEDLE PERC 18GX7CM (NEEDLE) ×3 IMPLANT
PACK CARDIAC CATH (CUSTOM PROCEDURE TRAY) ×3 IMPLANT
SHEATH PINNACLE 5F 10CM (SHEATH) ×3 IMPLANT
WIRE EMERALD 3MM-J .035X150CM (WIRE) ×6 IMPLANT

## 2016-10-26 NOTE — Progress Notes (Addendum)
HD completed. Assessment unchanged. R Groin cath site checked frequently. No bleeding/bruising/swelling noted.

## 2016-10-26 NOTE — Progress Notes (Signed)
[facility]  ROUNDING NOTE   Subjective:   Patient seen during dialysis Tolerating well    HEMODIALYSIS FLOWSHEET:  Blood Flow Rate (mL/min): 300 mL/min Arterial Pressure (mmHg): -100 mmHg Venous Pressure (mmHg): 200 mmHg Transmembrane Pressure (mmHg): 40 mmHg Ultrafiltration Rate (mL/min): 170 mL/min Dialysate Flow Rate (mL/min): 600 ml/min Conductivity: Machine : 13.8 Conductivity: Machine : 13.8 Dialysis Fluid Bolus: Normal Saline Bolus Amount (mL): 250 mL Dialysate Change: 2K (K=5.1) Intra-Hemodialysis Comments: 355. resting.   Underwent Cardiac cath Not candidate for re-vascularization    Objective:  Vital signs in last 24 hours:  Temp:  [98 F (36.7 C)-98.8 F (37.1 C)] 98 F (36.7 C) (01/25 1618) Pulse Rate:  [84-128] 104 (01/25 1830) Resp:  [14-19] 15 (01/25 1830) BP: (107-157)/(63-97) 131/75 (01/25 1830) SpO2:  [93 %-100 %] 93 % (01/25 1830) Weight:  [89.4 kg (197 lb)-89.5 kg (197 lb 5 oz)] 89.5 kg (197 lb 5 oz) (01/25 1618)  Weight change:  Filed Weights   10/25/16 0351 10/26/16 0740 10/26/16 1618  Weight: 93.7 kg (206 lb 8 oz) 89.4 kg (197 lb) 89.5 kg (197 lb 5 oz)    Intake/Output: I/O last 3 completed shifts: In: -  Out: 400 [Urine:400]   Intake/Output this shift:  Total I/O In: -  Out: 350 [Urine:350]  Physical Exam: General: NAD, laying in bed  Head: Normocephalic, atraumatic. Moist oral mucosal membranes. Temporal wasting  Eyes: Anicteric  Neck: Supple  Lungs:  Clear to auscultation, barrell chest  Heart: Regular rate and rhythm, +s3 gallop, no rub  Abdomen:  Soft, nontender  Extremities: trace peripheral edema, right knee drain  Neurologic: Nonfocal, moving all four extremities  Skin: No lesions, multiple tattoos  Access: Left femoral temp HD catheter 1/19 Dr. Gilda CreaseSchnier    Basic Metabolic Panel:  Recent Labs Lab 10/21/16 [id number] 10/21/16 1012  10/22/16 1125 10/23/16 0414 10/24/16 0455 10/25/16 0429 10/26/16 0412   NA 127*  --   --  130* 134*  --  128* 126*  K 5.3*  --   --  5.6* 4.7  --  5.0 5.1  CL 99*  --   --  99* 101  --  93* 93*  CO2 15*  --   --  15* 19*  --  24 24  GLUCOSE 118*  --   --  188* 168*  --  292* 287*  BUN 141*  --   --  123* 90*  --  83* 99*  CREATININE 7.31*  --   < > 6.70* 4.93* 3.56* 4.09* 4.60*  CALCIUM 8.0*  --   --  8.2* 8.2*  --  8.2* 7.9*  PHOS  --  7.3*  --  7.4*  --   --   --   --   < > = values in this interval not displayed.  Liver Function Tests:  Recent Labs Lab 10/22/16 1125  AST 22  ALT <5*  ALKPHOS 94  BILITOT 3.6*  PROT 5.8*  ALBUMIN 2.0*   No results for input(s): LIPASE, AMYLASE in the last 168 hours. No results for input(s): AMMONIA in the last 168 hours.  CBC:  Recent Labs Lab 10/21/16 0339 10/21/16 1012 10/23/16 0414 10/25/16 0429 10/26/16 0412  WBC 9.0 9.6 9.3 9.7 10.6  NEUTROABS  --  8.0*  --   --   --   HGB 8.3* 8.1* 8.2* 8.0* 7.8*  HCT 25.5* 25.3* 24.2* 23.7* 23.7*  MCV 82.9 83.5 80.3 81.1 81.8  PLT 76* 80*  86* 87* 98*    Cardiac Enzymes:  Recent Labs Lab 10/24/16 2233 10/25/16 0429 10/25/16 1017  TROPONINI 1.74* 1.87* 2.02*    BNP: Invalid input(s): POCBNP  CBG:  Recent Labs Lab 10/25/16 1117 10/25/16 1631 10/25/16 2107 10/26/16 0736 10/26/16 1144  GLUCAP 265* 191* 325* 266* 254*    Microbiology: Results for orders placed or performed during the hospital encounter of 10/17/16  Blood culture (routine x 2)     Status: Abnormal   Collection Time: 10/17/16  5:11 PM  Result Value Ref Range Status   Specimen Description BLOOD LEFT HAND  Final   Special Requests   Final    BOTTLES DRAWN AEROBIC AND ANAEROBIC AER ANA   Culture  Setup Time   Final    GRAM POSITIVE COCCI IN BOTH AEROBIC AND ANAEROBIC BOTTLES CRITICAL RESULT CALLED TO, READ BACK BY AND VERIFIED WITH: CHRISTINE KATSOUDAS 10/18/16 0925 SGD Performed at [facility], [address], [place], [place] [address]    Culture METHICILLIN  RESISTANT STAPHYLOCOCCUS AUREUS (A)  Final   Report Status 10/20/2016 FINAL  Final   Organism ID, Bacteria METHICILLIN RESISTANT STAPHYLOCOCCUS AUREUS  Final      Susceptibility   Methicillin resistant staphylococcus aureus - MIC*    CIPROFLOXACIN >=8 RESISTANT Resistant     ERYTHROMYCIN >=8 RESISTANT Resistant     GENTAMICIN <=0.5 SENSITIVE Sensitive     OXACILLIN 2 RESISTANT Resistant     TETRACYCLINE <=1 SENSITIVE Sensitive     VANCOMYCIN <=0.5 SENSITIVE Sensitive     TRIMETH/SULFA <=10 SENSITIVE Sensitive     CLINDAMYCIN <=0.25 SENSITIVE Sensitive     RIFAMPIN <=0.5 SENSITIVE Sensitive     Inducible Clindamycin NEGATIVE Sensitive     * METHICILLIN RESISTANT STAPHYLOCOCCUS AUREUS  Blood Culture ID Panel (Reflexed)     Status: Abnormal   Collection Time: 10/17/16  5:11 PM  Result Value Ref Range Status   Enterococcus species NOT DETECTED NOT DETECTED Final   Listeria monocytogenes NOT DETECTED NOT DETECTED Final   Staphylococcus species DETECTED (A) NOT DETECTED Final    Comment: CRITICAL RESULT CALLED TO, READ BACK BY AND VERIFIED WITH: CHRISTINE KATSOUDAS 10/18/16 0925 SGD    Staphylococcus aureus DETECTED (A) NOT DETECTED Final    Comment: CRITICAL RESULT CALLED TO, READ BACK BY AND VERIFIED WITH: CHRISTINE KATSOUDAS 10/18/16 0925 SGD    Methicillin resistance DETECTED (A) NOT DETECTED Final    Comment: CRITICAL RESULT CALLED TO, READ BACK BY AND VERIFIED WITH: CHRISTINE KATSOUDAS 10/18/16 0925 SGD    Streptococcus species NOT DETECTED NOT DETECTED Final   Streptococcus agalactiae NOT DETECTED NOT DETECTED Final   Streptococcus pneumoniae NOT DETECTED NOT DETECTED Final   Streptococcus pyogenes NOT DETECTED NOT DETECTED Final   Acinetobacter baumannii NOT DETECTED NOT DETECTED Final   Enterobacteriaceae species NOT DETECTED NOT DETECTED Final   Enterobacter cloacae complex NOT DETECTED NOT DETECTED Final   Escherichia coli NOT DETECTED NOT DETECTED Final   Klebsiella  oxytoca NOT DETECTED NOT DETECTED Final   Klebsiella pneumoniae NOT DETECTED NOT DETECTED Final   Proteus species NOT DETECTED NOT DETECTED Final   Serratia marcescens NOT DETECTED NOT DETECTED Final   Haemophilus influenzae NOT DETECTED NOT DETECTED Final   Neisseria meningitidis NOT DETECTED NOT DETECTED Final   Pseudomonas aeruginosa NOT DETECTED NOT DETECTED Final   Candida albicans NOT DETECTED NOT DETECTED Final   Candida glabrata NOT DETECTED NOT DETECTED Final   Candida krusei NOT DETECTED NOT DETECTED Final  Candida parapsilosis NOT DETECTED NOT DETECTED Final   Candida tropicalis NOT DETECTED NOT DETECTED Final  Body fluid culture     Status: None   Collection Time: 10/17/16  6:22 PM  Result Value Ref Range Status   Specimen Description Right Knee  Final   Special Requests NONE  Final   Gram Stain   Final    MODERATE WBC PRESENT, PREDOMINANTLY PMN FEW GRAM POSITIVE COCCI IN PAIRS    Culture   Final    ABUNDANT METHICILLIN RESISTANT STAPHYLOCOCCUS AUREUS NO ANAEROBES ISOLATED Performed at [facility], [address] [place], [place] [address]    Report Status 10/20/2016 FINAL  Final   Organism ID, Bacteria METHICILLIN RESISTANT STAPHYLOCOCCUS AUREUS  Final      Susceptibility   Methicillin resistant staphylococcus aureus - MIC*    CIPROFLOXACIN >=8 RESISTANT Resistant     ERYTHROMYCIN >=8 RESISTANT Resistant     GENTAMICIN <=0.5 SENSITIVE Sensitive     OXACILLIN 2 RESISTANT Resistant     TETRACYCLINE <=1 SENSITIVE Sensitive     VANCOMYCIN <=0.5 SENSITIVE Sensitive     TRIMETH/SULFA <=10 SENSITIVE Sensitive     CLINDAMYCIN <=0.25 SENSITIVE Sensitive     RIFAMPIN <=0.5 SENSITIVE Sensitive     Inducible Clindamycin NEGATIVE Sensitive     * ABUNDANT METHICILLIN RESISTANT STAPHYLOCOCCUS AUREUS  Blood culture (routine x 2)     Status: None   Collection Time: 10/17/16  8:37 PM  Result Value Ref Range Status   Specimen Description BLOOD RIGHT ARM  Final    Special Requests BOTTLES DRAWN AEROBIC AND ANAEROBIC  9CC  Final   Culture NO GROWTH 5 DAYS  Final   Report Status 10/22/2016 FINAL  Final  MRSA PCR Screening     Status: Abnormal   Collection Time: 10/17/16  8:50 PM  Result Value Ref Range Status   MRSA by PCR POSITIVE (A) NEGATIVE Final    Comment:        The GeneXpert MRSA Assay (FDA approved for NASAL specimens only), is one component of a comprehensive MRSA colonization surveillance program. It is not intended to diagnose MRSA infection nor to guide or monitor treatment for MRSA infections. RESULT CALLED TO, READ BACK BY AND VERIFIED WITH: VIVIAN ALI AT 2242 ON 10/17/2016 JLJ   Culture, blood (single) w Reflex to ID Panel     Status: None   Collection Time: 10/18/16 11:36 AM  Result Value Ref Range Status   Specimen Description BLOOD L HAND  Final   Special Requests BOTTLES DRAWN AEROBIC AND ANAEROBIC  Final   Culture NO GROWTH 5 DAYS  Final   Report Status 10/23/2016 FINAL  Final  Aerobic/Anaerobic Culture (surgical/deep wound)     Status: None   Collection Time: 10/18/16  9:18 PM  Result Value Ref Range Status   Specimen Description KNEE  Final   Special Requests NONE  Final   Gram Stain   Final    ABUNDANT WBC PRESENT, PREDOMINANTLY PMN MODERATE GRAM POSITIVE COCCI IN PAIRS IN CLUSTERS    Culture   Final    ABUNDANT METHICILLIN RESISTANT STAPHYLOCOCCUS AUREUS NO ANAEROBES ISOLATED Performed at [facility], [address] [place], [place] [address]    Report Status 10/24/2016 FINAL  Final   Organism ID, Bacteria METHICILLIN RESISTANT STAPHYLOCOCCUS AUREUS  Final      Susceptibility   Methicillin resistant staphylococcus aureus - MIC*    CIPROFLOXACIN >=8 RESISTANT Resistant     ERYTHROMYCIN >=8  RESISTANT Resistant     GENTAMICIN <=0.5 SENSITIVE Sensitive     OXACILLIN 2 RESISTANT Resistant     TETRACYCLINE <=1 SENSITIVE Sensitive     VANCOMYCIN 1 SENSITIVE Sensitive     TRIMETH/SULFA <=10 SENSITIVE  Sensitive     CLINDAMYCIN <=0.25 SENSITIVE Sensitive     RIFAMPIN <=0.5 SENSITIVE Sensitive     Inducible Clindamycin NEGATIVE Sensitive     * ABUNDANT METHICILLIN RESISTANT STAPHYLOCOCCUS AUREUS    Coagulation Studies: No results for input(s): LABPROT, INR in the last 72 hours.  Urinalysis: No results for input(s): COLORURINE, LABSPEC, PHURINE, GLUCOSEU, HGBUR, BILIRUBINUR, KETONESUR, PROTEINUR, UROBILINOGEN, NITRITE, LEUKOCYTESUR in the last 72 hours.  Invalid input(s): APPERANCEUR    Imaging: No results found.   Medications:   . sodium chloride 10 mL/hr at 10/26/16 1324  . sodium chloride     . amiodarone  200 mg Oral Daily  . aspirin  325 mg Oral Daily  . guaiFENesin  600 mg Oral BID   And  . dextromethorphan  30 mg Oral BID  . feeding supplement (NEPRO CARB STEADY)  237 mL Oral BID BM  . heparin subcutaneous  5,000 Units Subcutaneous Q8H  . insulin aspart  0-9 Units Subcutaneous TID PC & HS  . insulin glargine  9 Units Subcutaneous QHS  . isosorbide mononitrate  30 mg Oral Daily  . methimazole  10 mg Oral Daily  . metoprolol  50 mg Oral Daily  . pantoprazole  40 mg Oral Daily  . polyethylene glycol  17 g Oral Daily  . senna  1 tablet Oral BID  . sodium chloride flush  3 mL Intravenous Q12H  . sodium chloride flush  3 mL Intravenous Q12H   sodium chloride, sodium chloride, acetaminophen, bisacodyl, HYDROcodone-acetaminophen, metoCLOPramide **OR** metoCLOPramide (REGLAN) injection, morphine injection, ondansetron **OR** ondansetron (ZOFRAN) IV, ondansetron (ZOFRAN) IV, sodium chloride flush, sodium chloride flush, vancomycin  Assessment/ Plan:  [name] is a 46 y.o. white male with past medical history of hypertension, insulin dependent diabetes mellitus type 2, diabetic peripheral neuropathy  1. Acute renal Failure with metabolic acidosis, hyperkalemia and hyponatremia on chronic kidney disease stage IV with nephrotic range proteinuria and anasarca:  baseline creatinine 3.28, GFR of 21 from 09/24/16.   UOP remains poor  Acute renal failure from sepsis - source MRSA from septic joint.  - Patient has a new finding of severe CHF.   - Cardiac cath today revealed EF of 10%, chronically occluded mid LAD with collaterals, diffuse LV hypokinesis and dilated LV - permcath tomorrow - outpatient dialysis for ARF  2. Hypertension: -metoprolol  3. MRSA Knee infection Treated with iv vancomycin Total 6 weeks recommended  4. 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 Chronic thrombocytopenia Noted off and on since Oct 2017    LOS: 9 Chanice Brenton 1/25/20186:47 PM

## 2016-10-26 NOTE — Consult Note (Signed)
Pharmacy Antibiotic Note  Juan L Go Jr. is a [age] y[age]om.o. male admitted on 10/17/2016 with MRSA bacteremia, septic knee, possible PNA.  Pharmacy has been consulted for vancomycin and zosyn dosing. Zosyn d/c 10/20/16.  Plan 1/22 HD again today. According to nurse they have been doing full HD- will order vancomycin 1gm IV x1 now. Vanc random level ordered tomorrow AM.   1/23 No HD planned for today. Random Vanc level 23. (Goal: 20-25). No dose ordered for today.  1/24: Patient scheduled for perm cath placement today. Possible HD this evening. Will need to make sure Vancomycin 1gm IV is administered following HD.   1/25- Patient received HD and Vanc given at end of HD.    Height: 6\' 2"  (188 cm) Weight: 204 lb (92.5 kg) IBW/kg (Calculated) : 82.2  Temp (24hrs), Avg:98.4 F (36.9 C), Min:98 F (36.7 C), Max:98.8 F (37.1 C)   Recent Labs Lab 10/21/16 0339 10/21/16 1012  10/22/16 1125 10/23/16 0414 10/24/16 0455 10/24/16 1028 10/25/16 0429 10/26/16 0412  WBC 9.0 9.6  --   --  9.3  --   --  9.7 10.6  CREATININE 7.31*  --   < > 6.70* 4.93* 3.56*  --  4.09* 4.60*  VANCORANDOM 27  --   --   --   --   --  23  --   --   < > = values in this interval not displayed.  Estimated Creatinine Clearance: 23.1 mL/min (by C-G formula based on SCr of 4.6 mg/dL (H)).    Allergies  Allergen Reactions  . Sulfur Other (See Comments)    Pt states that this medication causes renal failure.      Antimicrobials this admission: vancomycin 1/16 >>  zosyn 1/16 >> 1/19  Dose adjustments this admission:   Microbiology results: 1/16 BCx: MRSA 2/4 so far 1/16 MRSA PCR: positive Joint fluid: few gram positive cocci in pair  Thank you for allowing pharmacy to be a part of this patient's care.  Angelique BlonderMerrill,Doniven Vanpatten A, PharmD, BCPS Clinical Pharmacist 10/26/2016 8:57 PM

## 2016-10-26 NOTE — Progress Notes (Signed)
[facility] [place] [facility]   PATIENT NAME: Juan ButtersWaltzie Roberson    MR#:  [id number]  DATE OF BIRTH:  08/18/1971  SUBJECTIVE:  CHIEF COMPLAINT:   Chief Complaint  Patient presents with  . Influenza  . Abdominal Pain  . Knee Pain    still having lots of knee pain and requesting pain meds, unable to bear weight still. BP is running low, and have gradually worsening renal function.  pt also appears completely alert and oriented today.   he had temporary catheter and started HD . PPD placed.  Troponin is >2, per recent echo- EF is 20%.   Cardio planned for Cath 10/26/16. REVIEW OF SYSTEMS:  Review of Systems  Constitutional: Negative for chills, fever and weight loss.  HENT: Negative for nosebleeds and sore throat.   Eyes: Negative for blurred vision.  Respiratory: Negative for cough, shortness of breath and wheezing.   Cardiovascular: Negative for chest pain, orthopnea, leg swelling and PND.  Gastrointestinal: Negative for abdominal pain, constipation, diarrhea, heartburn, nausea and vomiting.  Genitourinary: Negative for dysuria and urgency.  Musculoskeletal: Positive for joint pain. Negative for back pain.  Skin: Negative for rash.  Neurological: Negative for dizziness, speech change, focal weakness and headaches.  Endo/Heme/Allergies: Does not bruise/bleed easily.  Psychiatric/Behavioral: Negative for depression.    DRUG ALLERGIES:   Allergies  Allergen Reactions  . Sulfur Other (See Comments)    Pt states that this medication causes renal failure.     VITALS:  Blood pressure 125/74, pulse 91, temperature 98.7 F (37.1 C), resp. rate 15, height 6\' 2"  (1.88 m), weight 89.4 kg (197 lb), SpO2 98 %. PHYSICAL EXAMINATION:  Physical Exam  Constitutional: He is oriented to person, place, and time and well-developed, well-nourished, and in no distress.  HENT:  Head: Normocephalic and atraumatic.  Eyes: Conjunctivae and EOM are normal. Pupils are equal,  round, and reactive to light.  Neck: Normal range of motion. Neck supple. No tracheal deviation present. No thyromegaly present.  Cardiovascular: Normal rate, regular rhythm and normal heart sounds.   Pulmonary/Chest: Effort normal and breath sounds normal. No respiratory distress. He has no wheezes. He exhibits no tenderness.  Abdominal: Soft. Bowel sounds are normal. He exhibits no distension. There is no tenderness.  Musculoskeletal:       Right knee: He exhibits decreased range of motion and effusion. Tenderness found.  Neurological: He is alert and oriented to person, place, and time. No cranial nerve deficit.  Skin: Skin is warm and dry. No rash noted.  Psychiatric: Mood and affect normal.   LABORATORY PANEL:   CBC  Recent Labs Lab 10/26/16 0412  WBC 10.6  HGB 7.8*  HCT 23.7*  PLT 98*   ------------------------------------------------------------------------------------------------------------------ Chemistries   Recent Labs Lab 10/22/16 1125  10/26/16 0412  NA 130*  < > 126*  K 5.6*  < > 5.1  CL 99*  < > 93*  CO2 15*  < > 24  GLUCOSE 188*  < > 287*  BUN 123*  < > 99*  CREATININE 6.70*  < > 4.60*  CALCIUM 8.2*  < > 7.9*  AST 22  --   --   ALT <5*  --   --   ALKPHOS 94  --   --   BILITOT 3.6*  --   --   < > = values in this interval not displayed. RADIOLOGY:  No results found. ASSESSMENT AND PLAN:  Patient is a 46 year old white male recently  diagnosed with the flu now presents with sepsis  * MRSA Bacteremia, Sepsis: present on admission. Due to septic joint - continue IV vancomycin- will need total 6 weeks from 10/18/16 - reviewed fluid c/s  Now with renal failure and need for HD,     We can place permacath now as repeat cx negative.   Vascular want to have cardiac procedure first before going for permacath.   Likely permacath placement tomorrow.  * Septic Rt knee - Irrigation and debridement s/p arthoscopy on 1/17- reviewed cx result.   As per ID IV  vancomycin- will need total 6 weeks from 10/18/16  * Elevted troponin   Went up to 2, cardio consult.   Likely combination of Infection, CKD, Infection and CHF.   As per cardio- cath on 10/26/16.  * Ch systolic CHF   EF- 20%, monitor.  * Hyponatremia: - IVFs and monitor- improved.  * Influenza: Given one dose tamiflu- but improved now.  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  nephro suggest to continue HD as out pt.   Get permacath and work on d/c  * Diarrhea Resolved, could be viral  * Diabetes type 2 - continue sliding scale insulin - continue lantus and monitor  * History of DVT: no longer taking coumadin (his PCP asked him to stop)  * hypotension   Stopped clonidine patch.   Now stable.   All the records are reviewed and case discussed with Care Management/Social Worker. Management plans discussed with the patient, family (girlfriend at bedside), Nursing and they are in agreement.  CODE STATUS: FULL CODE  TOTAL TIME TAKING CARE OF THIS PATIENT: 35 minutes.   More than 50% of the time was spent in counseling/coordination of care: YES  POSSIBLE D/C IN 1-2 DAYS, DEPENDING ON CLINICAL CONDITION.   Altamese Dilling M.D on 10/26/2016 at 4:12 PM  Between 7am to 6pm - Pager - [phone]  After 6pm go to [email or web address] - Social research officer, government  [facility]  Office  [phone]  CC: Primary care physician; Corky Downs, MD  Note: This dictation was prepared with Dragon dictation along with smaller phrase technology. Any transcriptional errors that result from this process are unintentional.

## 2016-10-26 NOTE — Progress Notes (Signed)
Pre HD  

## 2016-10-26 NOTE — Progress Notes (Signed)
OT Cancellation Note  Patient Details Name: Juan BlossomWaltzie L Heisler Jr. MRN: [id number] DOB: 10/30/1970   Cancelled Treatment:    Reason Eval/Treat Not Completed: Medical issues which prohibited therapy (Pt. with elevated troponin levels, and trending up. Pt. to have cardiac Cath. Pt. pending cardiac cath per Cardiology consult. Will continue to monitor, and treat when appropriate.)  Olegario MessierElaine Vinisha Faxon, MS, OTR/L 10/26/2016, 9:22 AM

## 2016-10-26 NOTE — Progress Notes (Signed)
Hemodialysis initiated without issue. OK for patient to sit up after 4pm. Pt currently sitting up in HD. No current complaints.

## 2016-10-26 NOTE — Progress Notes (Addendum)
Inpatient Diabetes Program Recommendations  AACE/ADA: New Consensus Statement on Inpatient Glycemic Control (2015)  Target Ranges:  Prepandial:   less than 140 mg/dL      Peak postprandial:   less than 180 mg/dL (1-2 hours)      Critically ill patients:  140 - 180 mg/dL   Lab Results  Component Value Date   GLUCAP 266 (H) 10/26/2016   HGBA1C 9.3 (H) 09/24/2016    Review of Glycemic Control  Results for Juan Roberson, Tyshaun L JR. (MRN [id number]) as of 10/26/2016 10:16  Ref. Range 10/25/2016 07:34 10/25/2016 11:17 10/25/2016 16:31 10/25/2016 21:07 10/26/2016 07:36  Glucose-Capillary Latest Ref Range: 65 - 99 mg/dL 782282 (H) 956265 (H) 213191 (H) 325 (H) 266 (H)   Current orders for Inpatient glycemic control: Novolog 0-9 units ACHS, Lantus 5 units qhs  Inpatient Diabetes Program Recommendations:   RN- Please give am Novolog correction insulin- 0736am CBG was 266mg l.  Please recheck CBG before administering dose as this is based on a blood sugar taken within 1 hour of administration.   MD- Fasting blood sugar 266mg /dl - consider increasing Lantus to 9 units qhs (0.1unit/kg)  When diet is resumed, please consider ordering Novolog 3 units TID with meals for meal coverage if patient eats at least 50% of meals.  Susette RacerJulie Adonia Porada, RN, BA, MHA, CDE Diabetes Coordinator Inpatient Diabetes Program  [phone] (Team Pager) [phone] [facility] Office) 10/26/2016 10:33 AM

## 2016-10-26 NOTE — Progress Notes (Signed)
PT Cancellation Note  Patient Details Name: Juan BlossomWaltzie L Montesinos Jr. MRN: [id number] DOB: 08/18/1971   Cancelled Treatment:    Reason Eval/Treat Not Completed: Medical issues which prohibited therapy (Per chart review, patient with troponins trending up; pending cardiac cath this date.  Will hold until procedure complete and patient cleared for exertional activity.)   Donya Hitch H. Manson PasseyBrown, PT, DPT, NCS 10/26/16, 11:40 AM [phone]

## 2016-10-26 NOTE — Progress Notes (Signed)
SUBJECTIVE: patient denies any chest pain or shortness of breath but had elevated troponin up to 2.0 and is trending up.   Vitals:   10/25/16 1835 10/25/16 2108 10/26/16 0320 10/26/16 0740  BP: 112/82 116/69 107/68 126/63  Pulse:  87 84 89  Resp:  18 18 18   Temp:  98.8 F (37.1 C) 98.2 F (36.8 C) 98.6 F (37 C)  TempSrc:  Oral Oral Oral  SpO2:  97% 97% 97%  Weight:    197 lb (89.4 kg)  Height:        Intake/Output Summary (Last 24 hours) at 10/26/16 0825 Last data filed at 10/26/16 0741  Gross per 24 hour  Intake                0 ml  Output              750 ml  Net             -750 ml    LABS: Basic Metabolic Panel:  Recent Labs  09/81/1901/24/18 0429 10/26/16 0412  NA 128* 126*  K 5.0 5.1  CL 93* 93*  CO2 24 24  GLUCOSE 292* 287*  BUN 83* 99*  CREATININE 4.09* 4.60*  CALCIUM 8.2* 7.9*   Liver Function Tests: No results for input(s): AST, ALT, ALKPHOS, BILITOT, PROT, ALBUMIN in the last 72 hours. No results for input(s): LIPASE, AMYLASE in the last 72 hours. CBC:  Recent Labs  10/25/16 0429 10/26/16 0412  WBC 9.7 10.6  HGB 8.0* 7.8*  HCT 23.7* 23.7*  MCV 81.1 81.8  PLT 87* 98*   Cardiac Enzymes:  Recent Labs  10/24/16 2233 10/25/16 0429 10/25/16 1017  TROPONINI 1.74* 1.87* 2.02*   BNP: Invalid input(s): POCBNP D-Dimer: No results for input(s): DDIMER in the last 72 hours. Hemoglobin A1C: No results for input(s): HGBA1C in the last 72 hours. Fasting Lipid Panel: No results for input(s): CHOL, HDL, LDLCALC, TRIG, CHOLHDL, LDLDIRECT in the last 72 hours. Thyroid Function Tests: No results for input(s): TSH, T4TOTAL, T3FREE, THYROIDAB in the last 72 hours.  Invalid input(s): FREET3 Anemia Panel: No results for input(s): VITAMINB12, FOLATE, FERRITIN, TIBC, IRON, RETICCTPCT in the last 72 hours.   PHYSICAL EXAM General: Well developed, well nourished, in no acute distress HEENT:  Normocephalic and atramatic Neck:  No JVD.  Lungs: Clear  bilaterally to auscultation and percussion. Heart: HRRR . Normal S1 and S2 without gallops or murmurs.  Abdomen: Bowel sounds are positive, abdomen soft and non-tender  Msk:  Back normal, normal gait. Normal strength and tone for age. Extremities: No clubbing, cyanosis or edema.   Neuro: Alert and oriented X 3. Psych:  Good affect, responds appropriately  TELEMETRY:sinus rhythm  ASSESSMENT AND PLAN: non-STEMI with renal failure on dialysis and will do cardiac catheterization as troponin has gone up to 2.0. Patient is supposed to have AV fistula placed but was canceled yesterday because of elevated troponin and needs clearance.  Active Problems:   Sepsis (HCC)    Adrian BlackwaterKHAN,Staria Birkhead A, MD, [facility] 10/26/2016 8:25 AM

## 2016-10-26 NOTE — Progress Notes (Signed)
HD completed without issue. 0 UF per order. Report called to JerichoJennifer on 1A.

## 2016-10-26 NOTE — Progress Notes (Signed)
Pre HD assessment  

## 2016-10-27 ENCOUNTER — Encounter
Admission: EM | Disposition: A | Discharge status: Skilled Nursing Facility | Payer: Self-pay | Source: Home / Self Care | Attending: Internal Medicine

## 2016-10-27 ENCOUNTER — Encounter: Payer: Self-pay | Admitting: Cardiovascular Disease

## 2016-10-27 DIAGNOSIS — N186 End stage renal disease: Secondary | ICD-10-CM

## 2016-10-27 HISTORY — PX: PERIPHERAL VASCULAR CATHETERIZATION: SHX172C

## 2016-10-27 LAB — CREATININE, SERUM
CREATININE: 3.1 mg/dL — AB (ref 0.61–1.24)
GFR calc Af Amer: 26 mL/min — ABNORMAL LOW (ref >60)
GFR calc non Af Amer: 22 mL/min — ABNORMAL LOW (ref >60)

## 2016-10-27 LAB — CBC
HEMATOCRIT: 23.2 % — AB (ref 40.0–52.0)
Hemoglobin: 7.8 g/dL — ABNORMAL LOW (ref 13.0–18.0)
MCH: 27.6 pg (ref 26.0–34.0)
MCHC: 33.7 g/dL (ref 32.0–36.0)
MCV: 81.8 fL (ref 80.0–100.0)
PLATELETS: 106 10*3/uL — AB (ref 150–440)
RBC: 2.84 MIL/uL — ABNORMAL LOW (ref 4.40–5.90)
RDW: 17.2 % — AB (ref 11.5–14.5)
WBC: 10.3 10*3/uL (ref 3.8–10.6)

## 2016-10-27 LAB — GLUCOSE, CAPILLARY
Glucose-Capillary: 306 mg/dL — ABNORMAL HIGH (ref 65–99)
Glucose-Capillary: 190 mg/dL — ABNORMAL HIGH (ref 65–99)

## 2016-10-27 SURGERY — DIALYSIS/PERMA CATHETER INSERTION
Anesthesia: Moderate Sedation

## 2016-10-27 MED ORDER — MIDAZOLAM HCL 2 MG/2ML IJ SOLN
INTRAMUSCULAR | Status: DC | PRN
  Administered 2016-10-27: 2 mg via INTRAVENOUS

## 2016-10-27 MED ORDER — VANCOMYCIN HCL 500 MG IV SOLR
500.0000 mg | Freq: Once | INTRAVENOUS | Status: DC
  Filled 2016-10-27: qty 500

## 2016-10-27 MED ORDER — FENTANYL CITRATE (PF) 100 MCG/2ML IJ SOLN
INTRAMUSCULAR | Status: DC | PRN
  Administered 2016-10-27: 50 ug via INTRAVENOUS

## 2016-10-27 MED ORDER — FENTANYL CITRATE (PF) 100 MCG/2ML IJ SOLN
INTRAMUSCULAR | Status: AC
  Filled 2016-10-27: qty 2

## 2016-10-27 MED ORDER — SACUBITRIL-VALSARTAN 24-26 MG PO TABS: 1.0000 | ORAL_TABLET | Freq: Two times a day (BID) | ORAL | Status: DC

## 2016-10-27 MED ORDER — NEPRO/CARBSTEADY PO LIQD: 237.0000 mL | Freq: Two times a day (BID) | ORAL | 0 refills | Status: DC

## 2016-10-27 MED ORDER — CEFAZOLIN IN D5W 1 GM/50ML IV SOLN
INTRAVENOUS | Status: AC
  Filled 2016-10-27: qty 50

## 2016-10-27 MED ORDER — BISACODYL 10 MG RE SUPP: 10.0000 mg | Freq: Every day | RECTAL | 0 refills | Status: DC | PRN

## 2016-10-27 MED ORDER — CARVEDILOL 3.125 MG PO TABS: 3.1250 mg | ORAL_TABLET | Freq: Two times a day (BID) | ORAL | 0 refills | Status: DC

## 2016-10-27 MED ORDER — SODIUM CHLORIDE 0.9 % IV SOLN: INTRAVENOUS | Status: DC

## 2016-10-27 MED ORDER — SACUBITRIL-VALSARTAN 24-26 MG PO TABS: 1.0000 | ORAL_TABLET | Freq: Two times a day (BID) | ORAL | 0 refills | Status: DC

## 2016-10-27 MED ORDER — CARVEDILOL 6.25 MG PO TABS: 6.2500 mg | ORAL_TABLET | Freq: Two times a day (BID) | ORAL | Status: DC

## 2016-10-27 MED ORDER — CEFAZOLIN IN D5W 1 GM/50ML IV SOLN
1.0000 g | Freq: Once | INTRAVENOUS | Status: AC
  Administered 2016-10-27: 1 g via INTRAVENOUS

## 2016-10-27 MED ORDER — INSULIN GLARGINE 100 UNIT/ML ~~LOC~~ SOLN: 9.0000 [IU] | Freq: Every day | SUBCUTANEOUS | 11 refills | Status: DC

## 2016-10-27 MED ORDER — MIDAZOLAM HCL 5 MG/5ML IJ SOLN
INTRAMUSCULAR | Status: AC
  Filled 2016-10-27: qty 5

## 2016-10-27 MED ORDER — VANCOMYCIN HCL IN DEXTROSE 1-5 GM/200ML-% IV SOLN: 1000.0000 mg | INTRAVENOUS | 0 refills | Status: AC | PRN

## 2016-10-27 MED ORDER — HYDROCODONE-ACETAMINOPHEN 7.5-325 MG PO TABS: 1.0000 | ORAL_TABLET | Freq: Four times a day (QID) | ORAL | 0 refills | Status: DC | PRN

## 2016-10-27 SURGICAL SUPPLY — 5 items
CATH PALINDROME RT-P 15FX19CM (CATHETERS) ×3 IMPLANT
NEEDLE ENTRY 21GA 7CM ECHOTIP (NEEDLE) ×3 IMPLANT
PACK ANGIOGRAPHY (CUSTOM PROCEDURE TRAY) ×3 IMPLANT
SET INTRO CAPELLA COAXIAL (SET/KITS/TRAYS/PACK) ×3 IMPLANT
TOWEL OR 17X26 4PK STRL BLUE (TOWEL DISPOSABLE) ×3 IMPLANT

## 2016-10-27 NOTE — Clinical Social Work Placement (Signed)
   CLINICAL SOCIAL WORK PLACEMENT  NOTE  Date:  10/27/2016  Patient Details  Name: Juan BlossomWaltzie L Mcgillis Jr. MRN: [id number] Date of Birth: 09/30/1971  Clinical Social Work is seeking post-discharge placement for this patient at the Skilled  Nursing Facility level of care (*CSW will initial, date and re-position this form in  chart as items are completed):  Yes   Patient/family provided with [facility] Clinical Social Work Department's list of facilities offering this level of care within the geographic area requested by the patient (or if unable, by the patient's family).  Yes   Patient/family informed of their freedom to choose among providers that offer the needed level of care, that participate in Medicare, Medicaid or managed care program needed by the patient, have an available bed and are willing to accept the patient.  Yes   Patient/family informed of [facility]'s ownership interest in [facility] and [facility], as well as of the fact that they are under no obligation to receive care at these facilities.  PASRR submitted to EDS on 10/19/16     PASRR number received on 10/19/16     Existing PASRR number confirmed on       FL2 transmitted to all facilities in geographic area requested by pt/family on 10/19/16     FL2 transmitted to all facilities within larger geographic area on       Patient informed that his/her managed care company has contracts with or will negotiate with certain facilities, including the following:        Yes   Patient/family informed of bed offers received.  Patient chooses bed at  [facility] )     Physician recommends and patient chooses bed at      Patient to be transferred to  [facility] ) on 10/27/16.  Patient to be transferred to facility by  [facility] )     Patient family notified on 10/27/16 of transfer.  Name of family member notified:   (Patient's daughter Duwayne HeckDanielle is at bedside and aware of D/C today.  )     PHYSICIAN       Additional Comment:    _______________________________________________ Candela Krul, Darleen CrockerBailey M, LCSW 10/27/2016, 12:29 PM

## 2016-10-27 NOTE — Progress Notes (Signed)
ID note Pt to be dced today but cannot have HD until Tuesday Will ask pharm to dose an extra Vanco dose prior to dc.

## 2016-10-27 NOTE — H&P (Signed)
[facility] History & Physical Update  The patient was interviewed and re-examined.  The patient's previous History and Physical has been reviewed and is unchanged.  There is no change in the plan of care. We plan to proceed with the scheduled procedure.  Festus BarrenJason Dew, MD  10/27/2016, 12:05 PM

## 2016-10-27 NOTE — Consult Note (Addendum)
Pharmacy Antibiotic Note  Juan BlossomWaltzie L Beed Jr. is a 46 y.o. male admitted on 10/17/2016 with MRSA bacteremia, septic knee, possible PNA.  Pharmacy has been consulted for vancomycin and zosyn dosing. Zosyn d/c 10/20/16.  Plan 1/26  1447 Patient to be discontinued today. Next scheduled dialysis will be on Tuesday, 1/30, as an outpatient. After conversation with ID, will give vancomycin 500mg  IV x 1 prior to discharge.    Height: 6\' 2"  (188 cm) Weight: 204 lb (92.5 kg) IBW/kg (Calculated) : 82.2  Temp (24hrs), Avg:98.4 F (36.9 C), Min:98 F (36.7 C), Max:98.6 F (37 C)   Recent Labs Lab 10/21/16 0339 10/21/16 1012  10/23/16 0414 10/24/16 0455 10/24/16 1028 10/25/16 0429 10/26/16 0412 10/27/16 0410  WBC 9.0 9.6  --  9.3  --   --  9.7 10.6 10.3  CREATININE 7.31*  --   < > 4.93* 3.56*  --  4.09* 4.60* 3.10*  VANCORANDOM 27  --   --   --   --  23  --   --   --   < > = values in this interval not displayed.  Estimated Creatinine Clearance: 34.3 mL/min (by C-G formula based on SCr of 3.1 mg/dL (H)).    Allergies  Allergen Reactions  . Sulfur Other (See Comments)    Pt states that this medication causes renal failure.      Antimicrobials this admission: vancomycin 1/16 >>  zosyn 1/16 >> 1/19  Dose adjustments this admission:   Microbiology results: 1/16 BCx: MRSA 2/4 so far 1/16 MRSA PCR: positive Joint fluid: few gram positive cocci in pair  Thank you for allowing pharmacy to be a part of this patient's care.  Marty HeckWang, Hannah L, PharmD, BCPS Clinical Pharmacist 10/27/2016 2:19 PM

## 2016-10-27 NOTE — Progress Notes (Signed)
Clinical Child psychotherapistocial Worker (CSW) received a call from RN stating patient left [facility] after only being there for 2 hours. CSW contacted patient's girlfriend Vergia AlbertsCarmen Johnson [phone]. Per girlfriend she is going to take him to her house located at [address], [place], [place] [address]. Patient requested a bedside commode. CSW paged MD to ask him to order home health PT, RN, aide and bedside commode. CSW emphasized to patient and Porfirio MylarCarmen that patient will have to go to [facility] dialysis [facility] [address] at 6:40 am Tuesday for dialysis and IV Abx. RN case manager aware of above and will arrange home health.    [employer] Hughes Inc[employer]Bailey Shay Bartoli, LCSW

## 2016-10-27 NOTE — Care Management (Signed)
Received call from Dr. Thedore MinsSingh requesting that [facility] and that pad is under patient for transport to outpatient dialysis. [facility] a [facility]. I have updated CSW of this request. Dr. Thedore MinsSingh has also [facility]. Sharyl NimrodMeredith with [facility] will need to call Shary DecampLatroya Short Unit Management for the hall patient is going to- Sharyl NimrodMeredith said she would call.

## 2016-10-27 NOTE — Progress Notes (Signed)
Pt back to recovery. tol procedure well. resp regular and unlabored. Pale warm and dry. Alert oriented. Pt sitting up and eating lunch with po. No co pain. Pt stable.

## 2016-10-27 NOTE — Progress Notes (Signed)
There was a concern by out pt HD- pt does not have chair time since next week Tuesday.  I spoke to Dr. Thedore MinsSingh- Pt will be fine- if he does not get HD tomorrow, but he is suppose to get his antibiotics.  I spoke to Dr. Sampson GoonFitzgerald- He will order one Dose IV vanc extra today, and that will be OK to cover him till next week Tuesday.  I spoke to Child psychotherapistsocial worker- she is aware about this arrangement now.  Additional time spent is 30 min.

## 2016-10-27 NOTE — Care Management (Addendum)
I have faxed Vanco order to Susann Givensheryl Brawner with [employer]. Patient does not have to wait here for Life Vest to be delivered. I will notify Sharyl NimrodMeredith with [facility] [phone] to deliver to [facility]. Message left for HinckleyMeredith.

## 2016-10-27 NOTE — Care Management (Signed)
This CM was informed that patient does not have to wait in the hospital for his life vest.  Updated A Johnson when this CM received the call- which was around lunch time

## 2016-10-27 NOTE — Progress Notes (Signed)
Patient was discharged to [facility] via EMS. Temp cath removed. Permacath to right chest intact. IV removed with cath intact. Report called and family aware of transport.

## 2016-10-27 NOTE — Progress Notes (Signed)
[facility]  ROUNDING NOTE   Subjective:   Permcath placed today. Appetite has improved significantly.  Patient states overall he feels much better. Patient describes weakness, has not been able to ambulate since admission. Urine output improved.  No complaints of SOB, nausea, vomiting, currently on RA.  Underwent Cardiac cath Not candidate for re-vascularization    Objective:  Vital signs in last 24 hours:  Temp:  [98 F (36.7 C)-98.6 F (37 C)] 98.5 F (36.9 C) (01/26 1342) Pulse Rate:  [83-128] 102 (01/26 1342) Resp:  [12-19] 18 (01/26 1342) BP: (115-157)/(43-97) 138/43 (01/26 1342) SpO2:  [93 %-100 %] 100 % (01/26 1342) Weight:  [89.5 kg (197 lb 5 oz)-92.5 kg (204 lb)] 92.5 kg (204 lb) (01/26 1156)  Weight change:  Filed Weights   10/26/16 2013 10/27/16 0445 10/27/16 1156  Weight: 92.5 kg (204 lb) 92.5 kg (204 lb) 92.5 kg (204 lb)    Intake/Output: I/O last 3 completed shifts: In: -  Out: 1050 [Urine:1050]   Intake/Output this shift:  Total I/O In: -  Out: 200 [Urine:200]  Physical Exam: General: NAD, laying in bed  Head: Normocephalic, atraumatic. Moist oral mucosal membranes. Temporal wasting  Eyes: Anicteric  Neck: Supple  Lungs:  Clear to auscultation, barrell chest  Heart: Regular rate and rhythm, +s3 gallop, no rub  Abdomen:  Soft, nontender  Extremities: 2+ dependent edema, right knee drain  Neurologic: Nonfocal, moving all four extremities  Skin: No lesions, multiple tattoos  Access: R IJ PC    Basic Metabolic Panel:  Recent Labs Lab 10/21/16 0339 10/21/16 1012  10/22/16 1125 10/23/16 0414 10/24/16 0455 10/25/16 0429 10/26/16 0412 10/27/16 0410  NA 127*  --   --  130* 134*  --  128* 126*  --   K 5.3*  --   --  5.6* 4.7  --  5.0 5.1  --   CL 99*  --   --  99* 101  --  93* 93*  --   CO2 15*  --   --  15* 19*  --  24 24  --   GLUCOSE 118*  --   --  188* 168*  --  292* 287*  --   BUN 141*  --   --  123* 90*  --  83* 99*   --   CREATININE 7.31*  --   < > 6.70* 4.93* 3.56* 4.09* 4.60* 3.10*  CALCIUM 8.0*  --   --  8.2* 8.2*  --  8.2* 7.9*  --   PHOS  --  7.3*  --  7.4*  --   --   --   --   --   < > = values in this interval not displayed.  Liver Function Tests:  Recent Labs Lab 10/22/16 1125  AST 22  ALT <5*  ALKPHOS 94  BILITOT 3.6*  PROT 5.8*  ALBUMIN 2.0*   No results for input(s): LIPASE, AMYLASE in the last 168 hours. No results for input(s): AMMONIA in the last 168 hours.  CBC:  Recent Labs Lab 10/21/16 1012 10/23/16 0414 10/25/16 0429 10/26/16 0412 10/27/16 0410  WBC 9.6 9.3 9.7 10.6 10.3  NEUTROABS 8.0*  --   --   --   --   HGB 8.1* 8.2* 8.0* 7.8* 7.8*  HCT 25.3* 24.2* 23.7* 23.7* 23.2*  MCV 83.5 80.3 81.1 81.8 81.8  PLT 80* 86* 87* 98* 106*    Cardiac Enzymes:  Recent Labs Lab 10/24/16 2233 10/25/16 0429  10/25/16 1017  TROPONINI 1.74* 1.87* 2.02*    BNP: Invalid input(s): POCBNP  CBG:  Recent Labs Lab 10/26/16 0736 10/26/16 1144 10/26/16 2051 10/27/16 0724 10/27/16 1340  GLUCAP 266* 254* 204* 306* 190*    Microbiology: Results for orders placed or performed during the hospital encounter of 10/17/16  Blood culture (routine x 2)     Status: Abnormal   Collection Time: 10/17/16  5:11 PM  Result Value Ref Range Status   Specimen Description BLOOD LEFT HAND  Final   Special Requests   Final    BOTTLES DRAWN AEROBIC AND ANAEROBIC AER ANA   Culture  Setup Time   Final    GRAM POSITIVE COCCI IN BOTH AEROBIC AND ANAEROBIC BOTTLES CRITICAL RESULT CALLED TO, READ BACK BY AND VERIFIED WITH: CHRISTINE KATSOUDAS 10/18/16 0925 SGD Performed at [facility], [address] [place], [place] [address]    Culture METHICILLIN RESISTANT STAPHYLOCOCCUS AUREUS (A)  Final   Report Status 10/20/2016 FINAL  Final   Organism ID, Bacteria METHICILLIN RESISTANT STAPHYLOCOCCUS AUREUS  Final      Susceptibility   Methicillin resistant staphylococcus aureus - MIC*     CIPROFLOXACIN >=8 RESISTANT Resistant     ERYTHROMYCIN >=8 RESISTANT Resistant     GENTAMICIN <=0.5 SENSITIVE Sensitive     OXACILLIN 2 RESISTANT Resistant     TETRACYCLINE <=1 SENSITIVE Sensitive     VANCOMYCIN <=0.5 SENSITIVE Sensitive     TRIMETH/SULFA <=10 SENSITIVE Sensitive     CLINDAMYCIN <=0.25 SENSITIVE Sensitive     RIFAMPIN <=0.5 SENSITIVE Sensitive     Inducible Clindamycin NEGATIVE Sensitive     * METHICILLIN RESISTANT STAPHYLOCOCCUS AUREUS  Blood Culture ID Panel (Reflexed)     Status: Abnormal   Collection Time: 10/17/16  5:11 PM  Result Value Ref Range Status   Enterococcus species NOT DETECTED NOT DETECTED Final   Listeria monocytogenes NOT DETECTED NOT DETECTED Final   Staphylococcus species DETECTED (A) NOT DETECTED Final    Comment: CRITICAL RESULT CALLED TO, READ BACK BY AND VERIFIED WITH: CHRISTINE KATSOUDAS 10/18/16 0925 SGD    Staphylococcus aureus DETECTED (A) NOT DETECTED Final    Comment: CRITICAL RESULT CALLED TO, READ BACK BY AND VERIFIED WITH: CHRISTINE KATSOUDAS 10/18/16 0925 SGD    Methicillin resistance DETECTED (A) NOT DETECTED Final    Comment: CRITICAL RESULT CALLED TO, READ BACK BY AND VERIFIED WITH: CHRISTINE KATSOUDAS 10/18/16 0925 SGD    Streptococcus species NOT DETECTED NOT DETECTED Final   Streptococcus agalactiae NOT DETECTED NOT DETECTED Final   Streptococcus pneumoniae NOT DETECTED NOT DETECTED Final   Streptococcus pyogenes NOT DETECTED NOT DETECTED Final   Acinetobacter baumannii NOT DETECTED NOT DETECTED Final   Enterobacteriaceae species NOT DETECTED NOT DETECTED Final   Enterobacter cloacae complex NOT DETECTED NOT DETECTED Final   Escherichia coli NOT DETECTED NOT DETECTED Final   Klebsiella oxytoca NOT DETECTED NOT DETECTED Final   Klebsiella pneumoniae NOT DETECTED NOT DETECTED Final   Proteus species NOT DETECTED NOT DETECTED Final   Serratia marcescens NOT DETECTED NOT DETECTED Final   Haemophilus influenzae NOT DETECTED  NOT DETECTED Final   Neisseria meningitidis NOT DETECTED NOT DETECTED Final   Pseudomonas aeruginosa NOT DETECTED NOT DETECTED Final   Candida albicans NOT DETECTED NOT DETECTED Final   Candida glabrata NOT DETECTED NOT DETECTED Final   Candida krusei NOT DETECTED NOT DETECTED Final   Candida parapsilosis NOT DETECTED NOT DETECTED Final   Candida tropicalis NOT DETECTED NOT  DETECTED Final  Body fluid culture     Status: None   Collection Time: 10/17/16  6:22 PM  Result Value Ref Range Status   Specimen Description Right Knee  Final   Special Requests NONE  Final   Gram Stain   Final    MODERATE WBC PRESENT, PREDOMINANTLY PMN FEW GRAM POSITIVE COCCI IN PAIRS    Culture   Final    ABUNDANT METHICILLIN RESISTANT STAPHYLOCOCCUS AUREUS NO ANAEROBES ISOLATED Performed at [facility], [address] [place], [place] [address]    Report Status 10/20/2016 FINAL  Final   Organism ID, Bacteria METHICILLIN RESISTANT STAPHYLOCOCCUS AUREUS  Final      Susceptibility   Methicillin resistant staphylococcus aureus - MIC*    CIPROFLOXACIN >=8 RESISTANT Resistant     ERYTHROMYCIN >=8 RESISTANT Resistant     GENTAMICIN <=0.5 SENSITIVE Sensitive     OXACILLIN 2 RESISTANT Resistant     TETRACYCLINE <=1 SENSITIVE Sensitive     VANCOMYCIN <=0.5 SENSITIVE Sensitive     TRIMETH/SULFA <=10 SENSITIVE Sensitive     CLINDAMYCIN <=0.25 SENSITIVE Sensitive     RIFAMPIN <=0.5 SENSITIVE Sensitive     Inducible Clindamycin NEGATIVE Sensitive     * ABUNDANT METHICILLIN RESISTANT STAPHYLOCOCCUS AUREUS  Blood culture (routine x 2)     Status: None   Collection Time: 10/17/16  8:37 PM  Result Value Ref Range Status   Specimen Description BLOOD RIGHT ARM  Final   Special Requests BOTTLES DRAWN AEROBIC AND ANAEROBIC  9CC  Final   Culture NO GROWTH 5 DAYS  Final   Report Status 10/22/2016 FINAL  Final  MRSA PCR Screening     Status: Abnormal   Collection Time: 10/17/16  8:50 PM  Result Value Ref Range  Status   MRSA by PCR POSITIVE (A) NEGATIVE Final    Comment:        The GeneXpert MRSA Assay (FDA approved for NASAL specimens only), is one component of a comprehensive MRSA colonization surveillance program. It is not intended to diagnose MRSA infection nor to guide or monitor treatment for MRSA infections. RESULT CALLED TO, READ BACK BY AND VERIFIED WITH: VIVIAN ALI AT 2242 ON 10/17/2016 JLJ   Culture, blood (single) w Reflex to ID Panel     Status: None   Collection Time: 10/18/16 11:36 AM  Result Value Ref Range Status   Specimen Description BLOOD L HAND  Final   Special Requests BOTTLES DRAWN AEROBIC AND ANAEROBIC 2ML  Final   Culture NO GROWTH 5 DAYS  Final   Report Status 10/23/2016 FINAL  Final  Aerobic/Anaerobic Culture (surgical/deep wound)     Status: None   Collection Time: 10/18/16  9:18 PM  Result Value Ref Range Status   Specimen Description KNEE  Final   Special Requests NONE  Final   Gram Stain   Final    ABUNDANT WBC PRESENT, PREDOMINANTLY PMN MODERATE GRAM POSITIVE COCCI IN PAIRS IN CLUSTERS    Culture   Final    ABUNDANT METHICILLIN RESISTANT STAPHYLOCOCCUS AUREUS NO ANAEROBES ISOLATED Performed at [facility], [address] [place], [place] [address]    Report Status 10/24/2016 FINAL  Final   Organism ID, Bacteria METHICILLIN RESISTANT STAPHYLOCOCCUS AUREUS  Final      Susceptibility   Methicillin resistant staphylococcus aureus - MIC*    CIPROFLOXACIN >=8 RESISTANT Resistant     ERYTHROMYCIN >=8 RESISTANT Resistant     GENTAMICIN <=0.5 SENSITIVE Sensitive  OXACILLIN 2 RESISTANT Resistant     TETRACYCLINE <=1 SENSITIVE Sensitive     VANCOMYCIN 1 SENSITIVE Sensitive     TRIMETH/SULFA <=10 SENSITIVE Sensitive     CLINDAMYCIN <=0.25 SENSITIVE Sensitive     RIFAMPIN <=0.5 SENSITIVE Sensitive     Inducible Clindamycin NEGATIVE Sensitive     * ABUNDANT METHICILLIN RESISTANT STAPHYLOCOCCUS AUREUS    Coagulation Studies: No results for  input(s): LABPROT, INR in the last 72 hours.  Urinalysis: No results for input(s): COLORURINE, LABSPEC, PHURINE, GLUCOSEU, HGBUR, BILIRUBINUR, KETONESUR, PROTEINUR, UROBILINOGEN, NITRITE, LEUKOCYTESUR in the last 72 hours.  Invalid input(s): APPERANCEUR    Imaging: No results found.   Medications:   . sodium chloride 10 mL/hr at 10/26/16 1324   . amiodarone  200 mg Oral Daily  . aspirin  325 mg Oral Daily  . carvedilol  6.25 mg Oral BID WC  . guaiFENesin  600 mg Oral BID   And  . dextromethorphan  30 mg Oral BID  . feeding supplement (NEPRO CARB STEADY)  237 mL Oral BID BM  . heparin subcutaneous  5,000 Units Subcutaneous Q8H  . insulin aspart  0-9 Units Subcutaneous TID PC & HS  . insulin glargine  9 Units Subcutaneous QHS  . isosorbide mononitrate  30 mg Oral Daily  . methimazole  10 mg Oral Daily  . pantoprazole  40 mg Oral Daily  . polyethylene glycol  17 g Oral Daily  . sacubitril-valsartan  1 tablet Oral BID  . senna  1 tablet Oral BID  . sodium chloride flush  3 mL Intravenous Q12H   sodium chloride, acetaminophen, bisacodyl, HYDROcodone-acetaminophen, metoCLOPramide **OR** metoCLOPramide (REGLAN) injection, morphine injection, ondansetron **OR** ondansetron (ZOFRAN) IV, ondansetron (ZOFRAN) IV, sodium chloride flush, vancomycin  Assessment/ Plan:  Mr. Vannie Hochstetler. is a 46 y.o. white male with past medical history of hypertension, insulin dependent diabetes mellitus type 2, diabetic peripheral neuropathy  1. 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   UOP improving   Acute renal failure from sepsis - source MRSA from septic joint.  - Patient has a new finding of severe CHF.   - Cardiac cath revealed EF of 10%, chronically occluded mid LAD with collaterals, diffuse LV hypokinesis and dilated LV -  permcath has been placed - outpatient dialysis for ARF scheduled at [facility].  2. Hypertension: -carvedilol   3. MRSA Knee infection Treated with iv vancomycin Total 6 weeks recommended antibiotic dose has been called into dialysis nurse.  Discuss with Dr. Elisabeth Pigeon that patient should receive 1 dose of Vancomycin over the weekend at Rehab facility.  4. 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 Chronic thrombocytopenia Noted off and on since Oct 2017  May need hoyer lift to assist transfers. Care management aware and will arrange.   LOS: 10 Emillee Talsma 1/26/20182:39 PM

## 2016-10-27 NOTE — Care Management (Signed)
Post discharge: received call from LavonBailey CSW that patient had left [facility] against medical advice. He is now at his girlfriend's house in [place]. I have left message for girlfriend Vergia AlbertsCarmen Aline Wesche [phone] updating her that I am trying to arrange home health services with [facility]. [facility] will not be able to take patient in such short notice and after hours. Tim will send referral to other agencies to assist patient with home health services. Per CSW MD aware and will put in home health orders however orders are not available at this time. Message to Porfirio MylarCarmen telling her that his insurance will probably not cover the bedside commode and advised to go to [facility] or any pharmacy to purchase one because DME store is also closed. Porfirio MylarCarmen reminded of dialysis appointment on Tuesday and that [facility] work at [facility] may assist him with any needs during that time. Porfirio MylarCarmen advised to call this RNCM with any questions. Case closed.

## 2016-10-27 NOTE — Discharge Summary (Addendum)
[facility] NAME: Juan Roberson    MR#:  [id number]  DATE OF BIRTH:  04-29-1971  DATE OF ADMISSION:  10/17/2016 ADMITTING PHYSICIAN: Dustin Flock, MD  DATE OF DISCHARGE: 10/27/2016  PRIMARY CARE PHYSICIAN: MASOUD,JAVED, MD    ADMISSION DIAGNOSIS:  Pain [R52] Acidosis, metabolic [J49.7] AKI (acute kidney injury) ([place]) [N17.9] Knee pain, acute [M25.569] Sepsis, due to unspecified organism ([place]) [A41.9]  DISCHARGE DIAGNOSIS:  Active Problems:   Sepsis ([place])    ESRD   Ch systolic CHF   Infected joint.  SECONDARY DIAGNOSIS:   Past Medical History:  Diagnosis Date  . Cellulitis and abscess of foot    Left-Dr. Vickki Muff  . CHF (congestive heart failure) (HCC)    EF 20%  . CKD (chronic kidney disease), stage III   . Diabetes mellitus without complication ([place])    a. Dx ~ 1996.  [name] Dysrhythmia   . Essential hypertension   . Gastritis    a. 04/2015 hematemesis -> EGD: gastritis, esophagitis, duodenitis.  No active bleeding.  PPI added.  [name] GERD (gastroesophageal reflux disease)   . Left leg DVT ([place])    a. Dx 05/2015 -> Coumadin.  . MI (myocardial infarction)   . Osteomyelitis ([place])    a. 05/2015 L foot.    HOSPITAL COURSE:   * MRSA Bacteremia, Sepsis: present on admission. Due to septic joint - continue IV vancomycin- will need total 6 weeks from 10/18/16 - reviewed fluid c/s  Now with renal failure and need for HD,     We can place permacath now as repeat cx negative.   Vascular want to have cardiac procedure first before going for permacath.    Cardiac cath done- showed chronic blockages.   Permacath for Out pt HD.  * Septic Rt knee - Irrigation and debridement s/p arthoscopy on 1/17- reviewed cx result.   As per ID IV vancomycin- will need total 6 weeks from 10/18/16  * Elevted troponin   Went up to 2, cardio consult.   Likely combination of Infection, CKD, Infection and CHF.   As per cardio- cath on  10/26/16.- he have CAD- and chronic blockages, no intervention done, medical amangement   EF is <02%  * Ch systolic CHF   EF- 63%, monitor.   Cardio suggested to start on Coreg and Enteresto.   Advise to check his Blood pressure daily , if any concern about low BP- may have to stop coreg, and let him follow with cardiologist.  * Hyponatremia: - IVFs and monitor- improved.  * Influenza: Given one dose tamiflu- but improved now.  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  nephro suggest to continue HD as out pt.    Permacath for out pt HD>  * Diarrhea Resolved, could be viral  * Diabetes type 2 - continue sliding scale insulin - continue lantus and monitor  * History of DVT: no longer taking coumadin (his PCP asked him to stop)  * hypotension   Stopped clonidine patch.   Now stable.   Started on new meds for CHF- COreg and enteresto- Monitor BP daily.   DISCHARGE CONDITIONS:   Stable.  CONSULTS OBTAINED:  Treatment Team:  Earnestine Leys, MD Lavonia Dana, MD Katha Cabal, MD Dionisio David, MD  DRUG ALLERGIES:   Allergies  Allergen Reactions  . Sulfur Other (See Comments)  Pt states that this medication causes renal failure.      DISCHARGE MEDICATIONS:   Current Discharge Medication List    START taking these medications   Details  bisacodyl (DULCOLAX) 10 MG suppository Place 1 suppository (10 mg total) rectally daily as needed for moderate constipation. Qty: 12 suppository, Refills: 0    carvedilol (COREG) 3.125 MG tablet Take 1 tablet (3.125 mg total) by mouth 2 (two) times daily with a meal. Qty: 60 tablet, Refills: 0    HYDROcodone-acetaminophen (NORCO) 7.5-325 MG tablet Take 1 tablet by mouth every 6 (six) hours as needed for moderate pain (breakthrough pain). Qty: 30 tablet, Refills: 0    insulin glargine (LANTUS) 100 UNIT/ML injection  Inject 0.09 mLs (9 Units total) into the skin at bedtime. Qty: 10 mL, Refills: 11    sacubitril-valsartan (ENTRESTO) 24-26 MG Take 1 tablet by mouth 2 (two) times daily. Qty: 60 tablet, Refills: 0    vancomycin (VANCOCIN) 1-5 GM/200ML-% SOLN Inject 200 mLs (1,000 mg total) into the vein every dialysis. Qty: 4000 mL, Refills: 0      CONTINUE these medications which have CHANGED   Details  Nutritional Supplements (FEEDING SUPPLEMENT, NEPRO CARB STEADY,) LIQD Take 237 mLs by mouth 2 (two) times daily between meals. Qty: 20 Can, Refills: 0      CONTINUE these medications which have NOT CHANGED   Details  alum & mag hydroxide-simeth (MAALOX/MYLANTA) 200-200-20 MG/5ML suspension Take 30 mLs by mouth every 6 (six) hours as needed for indigestion or heartburn. Qty: 355 mL, Refills: 0    amiodarone (PACERONE) 200 MG tablet Take 1 tablet (200 mg total) by mouth daily. Qty: 30 tablet, Refills: 2    aspirin EC 81 MG EC tablet Take 1 tablet (81 mg total) by mouth daily. Qty: 30 tablet, Refills: 0    methimazole (TAPAZOLE) 10 MG tablet Take 1 tablet (10 mg total) by mouth daily. Qty: 30 tablet, Refills: 0    ondansetron (ZOFRAN ODT) 4 MG disintegrating tablet Take 1 tablet (4 mg total) by mouth every 8 (eight) hours as needed for nausea or vomiting. Qty: 20 tablet, Refills: 0    pantoprazole (PROTONIX) 40 MG tablet Take 1 tablet (40 mg total) by mouth daily. Qty: 30 tablet, Refills: 0    chlorpheniramine-HYDROcodone (TUSSIONEX PENNKINETIC ER) 10-8 MG/5ML SUER Take 5 mLs by mouth 2 (two) times daily. Qty: 140 mL, Refills: 0      STOP taking these medications     cloNIDine (CATAPRES - DOSED IN MG/24 HR) 0.3 mg/24hr patch      cloNIDine (CATAPRES) 0.2 MG tablet      furosemide (LASIX) 40 MG tablet      metoprolol (LOPRESSOR) 50 MG tablet      oseltamivir (TAMIFLU) 30 MG capsule      TOUJEO SOLOSTAR 300 UNIT/ML SOPN      triamcinolone cream (KENALOG) 0.1 %          DISCHARGE  INSTRUCTIONS:    Follow with ID clinic, Ortho clinic, [facility] in next 2 weeks.  Labs weekly while on IV antibiotics     ( Stop date- 11/29/16 for vancomycin)  CBC w diff         Comprehensive met panel Vancomycin Trough      Discuss the results with Dr. Blane Ohara office. [facility] Infectious Disease, [place])   If you experience worsening of your admission symptoms, develop shortness of breath, life threatening emergency, suicidal or homicidal thoughts you must seek medical attention immediately  by calling 911 or calling your MD immediately  if symptoms less severe.  You Must read complete instructions/literature along with all the possible adverse reactions/side effects for all the Medicines you take and that have been prescribed to you. Take any new Medicines after you have completely understood and accept all the possible adverse reactions/side effects.   Please note  You were cared for by a hospitalist during your hospital stay. If you have any questions about your discharge medications or the care you received while you were in the hospital after you are discharged, you can call the unit and asked to speak with the hospitalist on call if the hospitalist that took care of you is not available. Once you are discharged, your primary care physician will handle any further medical issues. Please note that NO REFILLS for any discharge medications will be authorized once you are discharged, as it is imperative that you return to your primary care physician (or establish a relationship with a primary care physician if you do not have one) for your aftercare needs so that they can reassess your need for medications and monitor your lab values.    Today   CHIEF COMPLAINT:   Chief Complaint  Patient presents with  . Influenza  . Abdominal Pain  . Knee Pain    HISTORY OF PRESENT ILLNESS:  Juan Roberson  is a 46 y.o. male with a known history of Chronic kidney disease stage III,  diabetes, essential hypertension, gastritis, history of DVT in the past was diagnosed with the flu on _0 0 ml  Net             -300 ml    PHYSICAL EXAMINATION:   Constitutional: He is oriented to person, place, and time and well-developed, well-nourished, and in no distress.  HENT:  Head: Normocephalic and atraumatic.  Eyes: Conjunctivae and EOM are normal. Pupils are equal, round, and reactive to light.  Neck: Normal range of motion. Neck supple. No tracheal deviation present. No thyromegaly present.  Cardiovascular: Normal rate, regular rhythm and normal heart sounds.   Pulmonary/Chest: Effort normal and breath sounds normal. No respiratory distress. He has no wheezes. He exhibits no tenderness.  Abdominal: Soft. Bowel sounds are normal. He exhibits no distension. There is no tenderness.  Musculoskeletal:       Right knee: He exhibits decreased range of motion and effusion. Tenderness found.  Neurological: He is alert and oriented to person, place, and time. No cranial  nerve deficit.  Skin: Skin is warm and dry. No rash noted.  Psychiatric: Mood and affect normal.   DATA REVIEW:    CBC  Recent Labs Lab 10/27/16 0410  WBC 10.3  HGB 7.8*  HCT 23.2*  PLT 106*    Chemistries   Recent Labs Lab 10/22/16 1125  10/26/16 0412 10/27/16 0410  NA 130*  < > 126*  --   K 5.6*  < > 5.1  --   CL 99*  < > 93*  --   CO2 15*  < > 24  --   GLUCOSE 188*  < > 287*  --   BUN 123*  < > 99*  --   CREATININE 6.70*  < > 4.60* 3.10*  CALCIUM 8.2*  < > 7.9*  --   AST 22  --   --   --   ALT <5*  --   --   --   ALKPHOS 94  --   --   --   BILITOT 3.6*  --   --   --   < > = values in this interval not displayed.  Cardiac Enzymes  Recent Labs Lab 10/25/16 1017  TROPONINI 2.02*    Microbiology Results  Results for orders placed or performed during the hospital encounter of 10/17/16  Blood culture (routine x 2)     Status: Abnormal   Collection Time: 10/17/16  5:11 PM  Result Value Ref Range Status   Specimen Description BLOOD LEFT HAND  Final   Special Requests   Final    BOTTLES DRAWN AEROBIC AND ANAEROBIC AER 4ML ANA 4ML   Culture  Setup Time   Final    GRAM POSITIVE COCCI IN BOTH AEROBIC AND ANAEROBIC BOTTLES CRITICAL RESULT CALLED TO, READ BACK BY AND VERIFIED WITH: CHRISTINE KATSOUDAS 10/18/16 1007 SGD Performed at [facility], [place] [address], [place], [place] [address]    Culture METHICILLIN RESISTANT STAPHYLOCOCCUS AUREUS (A)  Final   Report Status 10/20/2016 FINAL  Final   Organism ID, Bacteria METHICILLIN RESISTANT STAPHYLOCOCCUS AUREUS  Final      Susceptibility   Methicillin resistant staphylococcus aureus - MIC*    CIPROFLOXACIN >=8 RESISTANT Resistant     ERYTHROMYCIN >=8 RESISTANT Resistant     GENTAMICIN <=0.5 SENSITIVE Sensitive     OXACILLIN 2 RESISTANT Resistant     TETRACYCLINE <=1 SENSITIVE Sensitive     VANCOMYCIN <=0.5 SENSITIVE Sensitive     TRIMETH/SULFA <=10 SENSITIVE Sensitive     CLINDAMYCIN <=0.25 SENSITIVE Sensitive     RIFAMPIN <=0.5 SENSITIVE Sensitive     Inducible Clindamycin NEGATIVE Sensitive     * METHICILLIN  RESISTANT STAPHYLOCOCCUS AUREUS  Blood Culture ID Panel (Reflexed)     Status: Abnormal   Collection Time: 10/17/16  5:11 PM  Result Value Ref Range Status   Enterococcus species NOT DETECTED NOT DETECTED Final   Listeria monocytogenes NOT DETECTED NOT DETECTED Final   Staphylococcus species DETECTED (A) NOT DETECTED Final    Comment: CRITICAL RESULT CALLED TO, READ BACK BY AND VERIFIED WITH: CHRISTINE KATSOUDAS 10/18/16 0925 SGD    Staphylococcus aureus DETECTED (A) NOT DETECTED Final    Comment: CRITICAL RESULT CALLED TO, READ BACK BY AND VERIFIED WITH: CHRISTINE KATSOUDAS 10/18/16 0925 SGD    Methicillin resistance DETECTED (A) NOT DETECTED Final    Comment: CRITICAL RESULT CALLED TO, READ BACK BY AND VERIFIED WITH: CHRISTINE KATSOUDAS 10/18/16 0925 SGD    Streptococcus species NOT DETECTED NOT  DETECTED Final   Streptococcus agalactiae NOT DETECTED NOT DETECTED Final   Streptococcus pneumoniae NOT DETECTED NOT DETECTED Final   Streptococcus pyogenes NOT DETECTED NOT DETECTED Final   Acinetobacter baumannii NOT DETECTED NOT DETECTED Final   Enterobacteriaceae species NOT DETECTED NOT DETECTED Final   Enterobacter cloacae complex NOT DETECTED NOT DETECTED Final   Escherichia coli NOT DETECTED NOT DETECTED Final   Klebsiella oxytoca NOT DETECTED NOT DETECTED Final   Klebsiella pneumoniae NOT DETECTED NOT DETECTED Final   Proteus species NOT DETECTED NOT DETECTED Final   Serratia marcescens NOT DETECTED NOT DETECTED Final   Haemophilus influenzae NOT DETECTED NOT DETECTED Final   Neisseria meningitidis NOT DETECTED NOT DETECTED Final   Pseudomonas aeruginosa NOT DETECTED NOT DETECTED Final   Candida albicans NOT DETECTED NOT DETECTED Final   Candida glabrata NOT DETECTED NOT DETECTED Final   Candida krusei NOT DETECTED NOT DETECTED Final   Candida parapsilosis NOT DETECTED NOT DETECTED Final   Candida tropicalis NOT DETECTED NOT DETECTED Final  Body fluid culture     Status: None    Collection Time: 10/17/16  6:22 PM  Result Value Ref Range Status   Specimen Description Right Knee  Final   Special Requests NONE  Final   Gram Stain   Final    MODERATE WBC PRESENT, PREDOMINANTLY PMN FEW GRAM POSITIVE COCCI IN PAIRS    Culture   Final    ABUNDANT METHICILLIN RESISTANT STAPHYLOCOCCUS AUREUS NO ANAEROBES ISOLATED Performed at [facility], [address] [place], [place] [address]    Report Status 10/20/2016 FINAL  Final   Organism ID, Bacteria METHICILLIN RESISTANT STAPHYLOCOCCUS AUREUS  Final      Susceptibility   Methicillin resistant staphylococcus aureus - MIC*    CIPROFLOXACIN >=8 RESISTANT Resistant     ERYTHROMYCIN >=8 RESISTANT Resistant     GENTAMICIN <=0.5 SENSITIVE Sensitive     OXACILLIN 2 RESISTANT Resistant     TETRACYCLINE <=1 SENSITIVE Sensitive     VANCOMYCIN <=0.5 SENSITIVE Sensitive     TRIMETH/SULFA <=10 SENSITIVE Sensitive     CLINDAMYCIN <=0.25 SENSITIVE Sensitive     RIFAMPIN <=0.5 SENSITIVE Sensitive     Inducible Clindamycin NEGATIVE Sensitive     * ABUNDANT METHICILLIN RESISTANT STAPHYLOCOCCUS AUREUS  Blood culture (routine x 2)     Status: None   Collection Time: 10/17/16  8:37 PM  Result Value Ref Range Status   Specimen Description BLOOD RIGHT ARM  Final   Special Requests BOTTLES DRAWN AEROBIC AND ANAEROBIC  [place]  Final   Culture NO GROWTH 5 DAYS  Final   Report Status 10/22/2016 FINAL  Final  MRSA PCR Screening     Status: Abnormal   Collection Time: 10/17/16  8:50 PM  Result Value Ref Range Status   MRSA by PCR POSITIVE (A) NEGATIVE Final    Comment:        The GeneXpert MRSA Assay (FDA approved for NASAL specimens only), is one component of a comprehensive MRSA colonization surveillance program. It is not intended to diagnose MRSA infection nor to guide or monitor treatment for MRSA infections. RESULT CALLED TO, READ BACK BY AND VERIFIED WITH: VIVIAN ALI AT 2242 ON 10/17/2016 JLJ   Culture, blood (single) w  Reflex to ID Panel     Status: None   Collection Time: 10/18/16 11:36 AM  Result Value Ref Range Status   Specimen Description BLOOD L HAND  Final   Special Requests BOTTLES DRAWN AEROBIC AND ANAEROBIC 2ML  Final   Culture NO GROWTH 5 DAYS  Final   Report Status 10/23/2016 FINAL  Final  Aerobic/Anaerobic Culture (surgical/deep wound)     Status: None   Collection Time: 10/18/16  9:18 PM  Result Value Ref Range Status   Specimen Description KNEE  Final   Special Requests NONE  Final   Gram Stain   Final    ABUNDANT WBC PRESENT, PREDOMINANTLY PMN MODERATE GRAM POSITIVE COCCI IN PAIRS IN CLUSTERS    Culture   Final    ABUNDANT METHICILLIN RESISTANT STAPHYLOCOCCUS AUREUS NO ANAEROBES ISOLATED Performed at [facility], [address], [place], [place] [address]    Report Status 10/24/2016 FINAL  Final   Organism ID, Bacteria METHICILLIN RESISTANT STAPHYLOCOCCUS AUREUS  Final      Susceptibility   Methicillin resistant staphylococcus aureus - MIC*    CIPROFLOXACIN >=8 RESISTANT Resistant     ERYTHROMYCIN >=8 RESISTANT Resistant     GENTAMICIN <=0.5 SENSITIVE Sensitive     OXACILLIN 2 RESISTANT Resistant     TETRACYCLINE <=1 SENSITIVE Sensitive     VANCOMYCIN 1 SENSITIVE Sensitive     TRIMETH/SULFA <=10 SENSITIVE Sensitive     CLINDAMYCIN <=0.25 SENSITIVE Sensitive     RIFAMPIN <=0.5 SENSITIVE Sensitive     Inducible Clindamycin NEGATIVE Sensitive     * ABUNDANT METHICILLIN RESISTANT STAPHYLOCOCCUS AUREUS    RADIOLOGY:  No results found.  EKG:   Orders placed or performed during the hospital encounter of 10/17/16  . EKG 12-Lead  . EKG 12-Lead      Management plans discussed with the patient, family and they are in agreement.  CODE STATUS:     Code Status Orders        Start     Ordered   10/17/16 2019  Full code  Continuous     10/17/16 2018    Code Status History    Date Active Date Inactive Code Status Order ID Comments User Context   09/24/2016   3:36 PM 09/25/2016  2:15 PM Full Code [id number]  Lytle Butte, MD ED   07/21/2016 11:05 AM 07/22/2016  3:11 PM Full Code [id number]  Loletha Grayer, MD ED   02/28/2016  1:43 AM 03/01/2016  6:06 PM Full Code [id number]  Harrie Foreman, MD Inpatient   12/06/2015  8:39 PM 12/08/2015  2:51 PM Full Code [id number]  Vaughan Basta, MD Inpatient   11/01/2015  4:00 PM 11/03/2015  2:28 PM Full Code [id number]  Lytle Butte, MD ED   06/29/2015  2:30 AM 07/01/2015  2:21 PM Full Code [id number]  Harrie Foreman, MD Inpatient   05/21/2015  4:34 PM 05/25/2015  4:34 PM Full Code [id number]  Sharlotte Alamo, MD Inpatient   05/18/2015  9:17 PM 05/21/2015  4:34 PM Full Code [id number]  Demetrios [facility]oll, MD Inpatient   04/05/2015  7:05 AM 04/08/2015  2:00 PM Full Code [id number]  Harrie Foreman, MD Inpatient    Advance Directive Documentation   Flowsheet Row Most Recent Value  Type of Advance Directive  Healthcare Power of Attorney  Pre-existing out of facility DNR order (yellow form or pink MOST form)  No data  "MOST" Form in Place?  No data      TOTAL TIME TAKING CARE OF THIS PATIENT: 35 minutes.    Vaughan Basta M.D on 10/27/2016 at 11:16 AM  Between 7am to 6pm - Pager - [phone]  After 6pm go to [email or web address] - [facility]  Office  [phone]  CC: Primary care physician; Cletis Athens, MD   Note: This dictation was prepared with Dragon dictation along with smaller phrase technology. Any transcriptional errors that result from this process are unintentional.

## 2016-10-27 NOTE — Op Note (Signed)
OPERATIVE NOTE    PRE-OPERATIVE DIAGNOSIS: 1. ESRD   POST-OPERATIVE DIAGNOSIS: same as above  PROCEDURE: 1. Ultrasound guidance for vascular access to the right internal jugular vein 2. Fluoroscopic guidance for placement of catheter 3. Placement of a 19 cm tip to cuff tunneled hemodialysis catheter via the right internal jugular vein  SURGEON: Festus BarrenJason Luvia Orzechowski, MD  ANESTHESIA:  Local with Moderate conscious sedation for approximately 15 minutes using 2 mg of Versed and 50 mcg of Fentanyl  ESTIMATED BLOOD LOSS: 25 cc  FLUORO TIME: less than one minute  CONTRAST: none  FINDING(S): 1.  Patent right internal jugular vein  SPECIMEN(S):  None  INDICATIONS:   Juan BlossomWaltzie L Torrance Jr. is a 46 y.o.male who presents with renal failure.  The patient needs long term dialysis access for their ESRD, and a Permcath is necessary.  Risks and benefits are discussed and informed consent is obtained.    DESCRIPTION: After obtaining full informed written consent, the patient was brought back to the vascular suited. The patient's right neck and chest were sterilely prepped and draped in a sterile surgical field was created. Moderate conscious sedation was administered during a face to face encounter with the patient throughout the procedure with my supervision of the RN administering medicines and monitoring the patient's vital signs, pulse oximetry, telemetry and mental status throughout from the start of the procedure until the patient was taken to the recovery room.  The right internal jugular vein was visualized with ultrasound and found to be patent. It was then accessed under direct ultrasound guidance and a permanent image was recorded. A wire was placed. After skin nick and dilatation, the peel-away sheath was placed over the wire. I then turned my attention to an area under the clavicle. Approximately 1-2 fingerbreadths below the clavicle a small counterincision was created and tunneled from the  subclavicular incision to the access site. Using fluoroscopic guidance, a 19 centimeter tip to cuff tunneled hemodialysis catheter was selected, and tunneled from the subclavicular incision to the access site. It was then placed through the peel-away sheath and the peel-away sheath was removed. Using fluoroscopic guidance the catheter tips were parked in the right atrium. The appropriate distal connectors were placed. It withdrew blood well and flushed easily with heparinized saline and a concentrated heparin solution was then placed. It was secured to the chest wall with 2 Prolene sutures. The access incision was closed single 4-0 Monocryl. A 4-0 Monocryl pursestring suture was placed around the exit site. Sterile dressings were placed. The patient tolerated the procedure well and was taken to the recovery room in stable condition.  COMPLICATIONS: None  CONDITION: Stable  Festus BarrenJason Archita Lomeli  07/10/2016, 12:56 PM   This note was created with Dragon Medical transcription system. Any errors in dictation are purely unintentional.

## 2016-10-27 NOTE — Progress Notes (Signed)
Inpatient Diabetes Program Recommendations  AACE/ADA: New Consensus Statement on Inpatient Glycemic Control (2015)  Target Ranges:  Prepandial:   less than 140 mg/dL      Peak postprandial:   less than 180 mg/dL (1-2 hours)      Critically ill patients:  140 - 180 mg/dL   Results for Juan Roberson, Juan L JR. (MRN [id number]) as of 10/27/2016 10:12  Ref. Range 10/26/2016 07:36 10/26/2016 11:44 10/26/2016 20:51 10/27/2016 07:24  Glucose-Capillary Latest Ref Range: 65 - 99 mg/dL 132266 (H) 440254 (H) 102204 (H) 306 (H)   Review of Glycemic Control  Current orders for Inpatient glycemic control: Lantus 9 units QHS, Novolog 0-9 units ACHS  Inpatient Diabetes Program Recommendations:  Insulin - Basal: Please consider increasing Lantus to 14 units QHS (based on 92 kg x 0.15 units).  Thanks, Juan PennerMarie Kaira [place], RN, MSN, CDE Diabetes Coordinator Inpatient Diabetes Program [phone] (Team Pager from 8am to 5pm)

## 2016-10-27 NOTE — Care Management (Signed)
Contacted by [facility] and informed cardiology has initiated a referral for [employer].  Awaiting call back from cardiology as to whether patient requires the device PRIOR to discharge or whether it can be pursued as a outpatient.  Patient has EF 10%. Faxed Echo, cardiac cath results, H/P, cardiology notes and demographics to [facility].  Informed cardiology is to fax the actual order from their office.

## 2016-10-27 NOTE — Progress Notes (Signed)
SUBJECTIVE: Patient denies any chest pain or shortness of breath and feels better actually today than yesterday.   Vitals:   10/26/16 2013 10/27/16 0425 10/27/16 0445 10/27/16 0727  BP:  124/70  122/69  Pulse:  85  83  Resp:  19  16  Temp:  98.6 F (37 C)  98.4 F (36.9 C)  TempSrc:  Oral  Oral  SpO2:  97%  97%  Weight: 204 lb (92.5 kg)  204 lb (92.5 kg)   Height:        Intake/Output Summary (Last 24 hours) at 10/27/16 0832 Last data filed at 10/26/16 1921  Gross per 24 hour  Intake                0 ml  Output              300 ml  Net             -300 ml    LABS: Basic Metabolic Panel:  Recent Labs  16/10/96 0429 10/26/16 0412 10/27/16 0410  NA 128* 126*  --   K 5.0 5.1  --   CL 93* 93*  --   CO2 24 24  --   GLUCOSE 292* 287*  --   BUN 83* 99*  --   CREATININE 4.09* 4.60* 3.10*  CALCIUM 8.2* 7.9*  --    Liver Function Tests: No results for input(s): AST, ALT, ALKPHOS, BILITOT, PROT, ALBUMIN in the last 72 hours. No results for input(s): LIPASE, AMYLASE in the last 72 hours. CBC:  Recent Labs  10/26/16 0412 10/27/16 0410  WBC 10.6 10.3  HGB 7.8* 7.8*  HCT 23.7* 23.2*  MCV 81.8 81.8  PLT 98* 106*   Cardiac Enzymes:  Recent Labs  10/24/16 2233 10/25/16 0429 10/25/16 1017  TROPONINI 1.74* 1.87* 2.02*   BNP: Invalid input(s): POCBNP D-Dimer: No results for input(s): DDIMER in the last 72 hours. Hemoglobin A1C: No results for input(s): HGBA1C in the last 72 hours. Fasting Lipid Panel: No results for input(s): CHOL, HDL, LDLCALC, TRIG, CHOLHDL, LDLDIRECT in the last 72 hours. Thyroid Function Tests: No results for input(s): TSH, T4TOTAL, T3FREE, THYROIDAB in the last 72 hours.  Invalid input(s): FREET3 Anemia Panel: No results for input(s): VITAMINB12, FOLATE, FERRITIN, TIBC, IRON, RETICCTPCT in the last 72 hours.   PHYSICAL EXAM General: Well developed, well nourished, in no acute distress HEENT:  Normocephalic and atramatic Neck:  No  JVD.  Lungs: Clear bilaterally to auscultation and percussion. Heart: HRRR . Normal S1 and S2 without gallops or murmurs.  Abdomen: Bowel sounds are positive, abdomen soft and non-tender  Msk:  Back normal, normal gait. Normal strength and tone for age. Extremities: No clubbing, cyanosis or edema.   Neuro: Alert and oriented X 3. Psych:  Good affect, responds appropriately  TELEMETRY:Sinus rhythm  ASSESSMENT AND PLAN: Patient had cardiac catheterization yesterday which showed occluded LAD in the midportion. Left circumflex and RCA had no significant disease. Left ventricular ejection fraction was 10% with diffuse hypokinesis and akinesis of the anteroapical wall. I reviewed films with another cardiologist at [facility] and it was felt that the LAD is chronically occluded may be talking to the patient he had chest pain one year ago and that's when it was occluded. It doesn't have much collaterals but the infarction in the anterior wall with diffuse hypokinesis and ejection fraction 10% is not a candidate for revascularization. Advise aggressive medical therapy and patient was started on entersto and carvedilol today.  Patient's platelet count are only 98 and hemoglobin is 8 and if we do PCI and deployed drug-eluting stent he won't be able to take Plavix. He is high risk for bleeding and has underlying anemia as it is. We will get lifevest arranged outpatient also. Advise proceeding with AV shunt placement.  Active Problems:   Sepsis (HCC)    Laurier NancyKHAN,SHAUKAT A, MD, [facility] 10/27/2016 8:32 AM

## 2016-10-27 NOTE — Progress Notes (Signed)
Physical Therapy Treatment Patient Details Name: Juan Roberson. MRN: [id number] DOB: 02-02-1971 Today's Date: 10/27/2016    History of Present Illness Patient is a 46 y/o male that presents s/p I&D of R knee with underlying pneumonia and recent flu as well. Currently undergoing HD treatments    PT Comments    Pt alert/ awake and in a pleasant mood this morning. Stated he is feeling much better today. Treatment limited to Supine therex due to precautions with Femoral catheter in L LE . Pt demonstrated increase UE strength and able to tolerate increased resistance and repetitions during therex. R LE continues to display ROM and strength deficits and minimal pain in the right knee during exercises. Pt progressing from previous treatments but still displays strength, ROM and mobility deficits. Pt scheduled for perm cath today and will plan on OOB therapeutic activities tomorrow. Pt will continue to benefit from skilled PT. Recommend dc to STR following acute hospitalization.    Follow Up Recommendations  SNF     Equipment Recommendations  Rolling walker with 5" wheels    Recommendations for Other Services       Precautions / Restrictions Precautions Precautions: Fall Precaution Comments: L Femoral artery catheter-  Restrictions Weight Bearing Restrictions: Yes RLE Weight Bearing: Partial weight bearing RLE Partial Weight Bearing Percentage or Pounds: 50% Other Position/Activity Restrictions: No L active or passive L Hip movements due to femoral cath    Mobility  Bed Mobility               General bed mobility comments: Bed mobility deferred due to femoral catheter in L LE  Transfers                 General transfer comment: not assessed due to L LE femoral   Ambulation/Gait             General Gait Details: did not attempt    Stairs            Wheelchair Mobility    Modified Rankin (Stroke Patients Only)       Balance       Sitting  balance - Comments: did not attempt due to precautions w/ L femoral catheter        Standing balance comment: did not attempt due to precautions w/ L femoral catheter                     Cognition Arousal/Alertness: Awake/alert Behavior During Therapy: WFL for tasks assessed/performed Overall Cognitive Status: Within Functional Limits for tasks assessed                      Exercises Other Exercises Other Exercises: Supine therex UE to improve strength/endurance for functional activities; AROM w/ resistance; Shoulder, flex abd, IR, ER, Elbow flex ext 1x15; Rhythmic stabilizaition 3x30 seconds for shoudler stabilty and core function. Other Exercises: Supine R LE therex; AROM ; with resistance to improve overall strength and ROM for functional tasks; heel slides, SLR, hip abd, SAQs. L LE ankle pumps and quad sets - 1x12 AROM    General Comments        Pertinent Vitals/Pain Pain Assessment: Faces Faces Pain Scale: Hurts a little bit Pain Location: R knee Pain Descriptors / Indicators: Aching Pain Intervention(s): Monitored during session    Home Living                      Prior Function  PT Goals (current goals can now be found in the care plan section) Acute Rehab PT Goals Patient Stated Goal: To return home  PT Goal Formulation: With patient Potential to Achieve Goals: Fair Progress towards PT goals: Progressing toward goals    Frequency    7X/week      PT Plan Current plan remains appropriate    Co-evaluation             End of Session     Patient left: in bed;with call bell/phone within reach;with family/visitor present;with bed alarm set     Time: [id number] PT Time Calculation (min) (ACUTE ONLY): 18 min  Charges:  $Therapeutic Exercise: 8-22 mins                    G Codes:      [employer] Barcellos Student PT 10/27/2016, 10:41 AM

## 2016-10-27 NOTE — Care Management (Addendum)
This RNCM spoke with Dr. Thedore MinsSingh yesterday in passing. Plan for Perma Cath today. Vancomycin to be administered with outpatient dialysis center. I have requested M, W, F chair time with Susann GivensCheryl Brawner in case patient discharges over the weekend. RNCM will continue to follow. RNCM to fax discharge summary to [facility] Susann GivensCheryl Brawner [phone].

## 2016-10-27 NOTE — Care Management (Signed)
I received callback from [place] with [employer] regarding pending authorization for Lifevest to be delivered to [facility] when his insurance approves (sometimes take 24-48 hours). CSW will update [place] health care. Dr. Welton FlakesKhan is okay with patient waiting that long for vest. No further RNCM needs. CSW will follow for SNF.

## 2016-10-27 NOTE — Progress Notes (Addendum)
Patient is medically stable for D/C to [facility] today. Per [place] admissions coordinator at [facility] [employer] authorization is still good through 11/01/16 and patient can come today to room 32-A. RN will call report and arrange EMS for transport. Clinical Child psychotherapistocial Worker (CSW) sent D/C orders to [facility] via [facility]. [facility] [address], where patient will be going for dialysis starting on Tuesday 10/31/16. Chair time is Tuesday, Thursday, Saturday at 6:40 am. Patient's daughter Duwayne HeckDanielle is aware of above. Please reconsult if future social work needs arise. CSW signing off.   Baker Hughes IncorporatedBailey Wolf Boulay, LCSW [phone]

## 2016-10-27 NOTE — Care Management (Signed)
Plan for first shift T, Th, Sat outpatient dialysis with [facility] [name]. Per Dr. Thedore MinsSingh that will be fine. I have text Dr. Sampson GoonFitzgerald for Vanco Rx to be sent to dialysis via Juan Roberson. RNCM will continue to follow. Daughter Juan Roberson [phone] updated and agrees to discharge however she has questions about "a heart blockage she was told about". She states she will not be able to take him in the car now due to his weakness. I will updated CSW. [facility] [name]:   Patient Placed Notification - Permanent Admission  Patient Information  Patient's Name [name]  Date of Birth 1971-08-12  MPI # [id number]  Patient's Home Phone # [phone] (home phone)  Primary Insurance Name [employer]  Secondary Insurance   Patient's FDOD   Nephrologist's Name   Placement Details  Referral Source and Contact [facility]  Mesaheryl Roberson  [phone]  Accepting Facility  [facility]  [phone]  Specific Accommodations   1st Treatment Date & Time 10/31/2016 6:00 AM  Regular Treatment Days & Time Days: TTS, 6:40 AM  If Days are "Other", please specify schedule:   Comments   Please work with the referral source to finalize any placement details.

## 2016-10-27 NOTE — Progress Notes (Signed)
Report called to floor pt tol lunch well. No co pain. Dressing dry and intact. tol proc well.

## 2016-10-27 NOTE — Discharge Instructions (Signed)
Labs weekly while on IV antibiotics      CBC w diff         Comprehensive met panel Vancomycin Trough      Discuss the results with Dr. Blane Ohara office. ( [facility], [place])

## 2016-10-28 ENCOUNTER — Emergency Department: Payer: BLUE CROSS/BLUE SHIELD

## 2016-10-28 ENCOUNTER — Observation Stay
Admission: EM | Admit: 2016-10-28 | Discharge: 2016-10-31 | Disposition: A | Discharge status: Home / Self Care | Payer: BLUE CROSS/BLUE SHIELD | Attending: Internal Medicine | Admitting: Internal Medicine

## 2016-10-28 DIAGNOSIS — D72829 Elevated white blood cell count, unspecified: Secondary | ICD-10-CM

## 2016-10-28 DIAGNOSIS — E1165 Type 2 diabetes mellitus with hyperglycemia: Secondary | ICD-10-CM | POA: Diagnosis not present

## 2016-10-28 DIAGNOSIS — I4891 Unspecified atrial fibrillation: Secondary | ICD-10-CM | POA: Diagnosis not present

## 2016-10-28 DIAGNOSIS — Z794 Long term (current) use of insulin: Secondary | ICD-10-CM | POA: Insufficient documentation

## 2016-10-28 DIAGNOSIS — R739 Hyperglycemia, unspecified: Secondary | ICD-10-CM | POA: Diagnosis present

## 2016-10-28 DIAGNOSIS — Z7982 Long term (current) use of aspirin: Secondary | ICD-10-CM | POA: Insufficient documentation

## 2016-10-28 DIAGNOSIS — D631 Anemia in chronic kidney disease: Secondary | ICD-10-CM | POA: Diagnosis not present

## 2016-10-28 DIAGNOSIS — E872 Acidosis, unspecified: Secondary | ICD-10-CM

## 2016-10-28 DIAGNOSIS — I5022 Chronic systolic (congestive) heart failure: Secondary | ICD-10-CM | POA: Diagnosis not present

## 2016-10-28 DIAGNOSIS — N179 Acute kidney failure, unspecified: Secondary | ICD-10-CM | POA: Insufficient documentation

## 2016-10-28 DIAGNOSIS — I132 Hypertensive heart and chronic kidney disease with heart failure and with stage 5 chronic kidney disease, or end stage renal disease: Secondary | ICD-10-CM | POA: Insufficient documentation

## 2016-10-28 DIAGNOSIS — Z79899 Other long term (current) drug therapy: Secondary | ICD-10-CM | POA: Insufficient documentation

## 2016-10-28 DIAGNOSIS — Z992 Dependence on renal dialysis: Secondary | ICD-10-CM | POA: Diagnosis not present

## 2016-10-28 DIAGNOSIS — N186 End stage renal disease: Secondary | ICD-10-CM | POA: Insufficient documentation

## 2016-10-28 DIAGNOSIS — I252 Old myocardial infarction: Secondary | ICD-10-CM | POA: Diagnosis not present

## 2016-10-28 DIAGNOSIS — Z86718 Personal history of other venous thrombosis and embolism: Secondary | ICD-10-CM | POA: Insufficient documentation

## 2016-10-28 DIAGNOSIS — K219 Gastro-esophageal reflux disease without esophagitis: Secondary | ICD-10-CM | POA: Diagnosis not present

## 2016-10-28 DIAGNOSIS — I2510 Atherosclerotic heart disease of native coronary artery without angina pectoris: Secondary | ICD-10-CM | POA: Insufficient documentation

## 2016-10-28 DIAGNOSIS — E0590 Thyrotoxicosis, unspecified without thyrotoxic crisis or storm: Secondary | ICD-10-CM | POA: Diagnosis not present

## 2016-10-28 DIAGNOSIS — D696 Thrombocytopenia, unspecified: Secondary | ICD-10-CM | POA: Diagnosis not present

## 2016-10-28 DIAGNOSIS — Z792 Long term (current) use of antibiotics: Secondary | ICD-10-CM | POA: Diagnosis not present

## 2016-10-28 DIAGNOSIS — E1122 Type 2 diabetes mellitus with diabetic chronic kidney disease: Secondary | ICD-10-CM | POA: Diagnosis not present

## 2016-10-28 LAB — COMPREHENSIVE METABOLIC PANEL
ALT: 9 U/L — ABNORMAL LOW (ref 17–63)
AST: 33 U/L (ref 15–41)
Albumin: 2 g/dL — ABNORMAL LOW (ref 3.5–5.0)
Alkaline Phosphatase: 111 U/L (ref 38–126)
Anion gap: 9 (ref 5–15)
BUN: 76 mg/dL — ABNORMAL HIGH (ref 6–20)
CHLORIDE: 92 mmol/L — AB (ref 101–111)
CO2: 27 mmol/L (ref 22–32)
CREATININE: 3.65 mg/dL — AB (ref 0.61–1.24)
Calcium: 7.9 mg/dL — ABNORMAL LOW (ref 8.9–10.3)
GFR calc non Af Amer: 18 mL/min — ABNORMAL LOW (ref >60)
GFR, EST AFRICAN AMERICAN: 21 mL/min — AB (ref >60)
Glucose, Bld: 497 mg/dL — ABNORMAL HIGH (ref 65–99)
POTASSIUM: 4.8 mmol/L (ref 3.5–5.1)
Sodium: 128 mmol/L — ABNORMAL LOW (ref 135–145)
TOTAL PROTEIN: 6.3 g/dL — AB (ref 6.5–8.1)
Total Bilirubin: 4.1 mg/dL — ABNORMAL HIGH (ref 0.3–1.2)

## 2016-10-28 LAB — LACTIC ACID, PLASMA
LACTIC ACID, VENOUS: 2.4 mmol/L — AB (ref 0.5–1.9)
LACTIC ACID, VENOUS: 3.3 mmol/L — AB (ref 0.5–1.9)

## 2016-10-28 LAB — CBC WITH DIFFERENTIAL/PLATELET
Basophils Absolute: 0.1 10*3/uL (ref 0–0.1)
Basophils Relative: 1 %
EOS PCT: 1 %
Eosinophils Absolute: 0.1 10*3/uL (ref 0–0.7)
HCT: 25.6 % — ABNORMAL LOW (ref 40.0–52.0)
Hemoglobin: 8.1 g/dL — ABNORMAL LOW (ref 13.0–18.0)
LYMPHS ABS: 0.8 10*3/uL — AB (ref 1.0–3.6)
LYMPHS PCT: 6 %
MCH: 26.9 pg (ref 26.0–34.0)
MCHC: 31.5 g/dL — ABNORMAL LOW (ref 32.0–36.0)
MCV: 85.4 fL (ref 80.0–100.0)
MONO ABS: 1 10*3/uL (ref 0.2–1.0)
MONOS PCT: 8 %
Neutro Abs: 10.3 10*3/uL — ABNORMAL HIGH (ref 1.4–6.5)
Neutrophils Relative %: 84 %
PLATELETS: 147 10*3/uL — AB (ref 150–440)
RBC: 3 MIL/uL — ABNORMAL LOW (ref 4.40–5.90)
RDW: 19.4 % — AB (ref 11.5–14.5)
WBC: 12.2 10*3/uL — ABNORMAL HIGH (ref 3.8–10.6)

## 2016-10-28 LAB — TROPONIN I: TROPONIN I: 0.93 ng/mL — AB (ref <0.03)

## 2016-10-28 LAB — GLUCOSE, CAPILLARY
GLUCOSE-CAPILLARY: 274 mg/dL — AB (ref 65–99)
GLUCOSE-CAPILLARY: 330 mg/dL — AB (ref 65–99)
Glucose-Capillary: 432 mg/dL — ABNORMAL HIGH (ref 65–99)

## 2016-10-28 MED ORDER — AMIODARONE HCL 200 MG PO TABS
200.0000 mg | ORAL_TABLET | Freq: Every day | ORAL | Status: DC
  Administered 2016-10-29 – 2016-10-31 (×3): 200 mg via ORAL
  Filled 2016-10-28 (×3): qty 1

## 2016-10-28 MED ORDER — VANCOMYCIN HCL IN DEXTROSE 1-5 GM/200ML-% IV SOLN
1000.0000 mg | INTRAVENOUS | Status: DC
  Administered 2016-10-31: 1000 mg via INTRAVENOUS
  Filled 2016-10-28: qty 200

## 2016-10-28 MED ORDER — ACETAMINOPHEN 650 MG RE SUPP: 650.0000 mg | Freq: Four times a day (QID) | RECTAL | Status: DC | PRN

## 2016-10-28 MED ORDER — ACETAMINOPHEN 325 MG PO TABS: 650.0000 mg | ORAL_TABLET | Freq: Four times a day (QID) | ORAL | Status: DC | PRN

## 2016-10-28 MED ORDER — SODIUM CHLORIDE 0.9 % IV BOLUS (SEPSIS)
500.0000 mL | Freq: Once | INTRAVENOUS | Status: AC
  Administered 2016-10-28: 500 mL via INTRAVENOUS

## 2016-10-28 MED ORDER — NEPRO/CARBSTEADY PO LIQD
237.0000 mL | Freq: Two times a day (BID) | ORAL | Status: DC
  Administered 2016-10-30: 237 mL via ORAL

## 2016-10-28 MED ORDER — METHIMAZOLE 10 MG PO TABS
10.0000 mg | ORAL_TABLET | Freq: Every day | ORAL | Status: DC
  Administered 2016-10-29 – 2016-10-31 (×3): 10 mg via ORAL
  Filled 2016-10-28 (×3): qty 1

## 2016-10-28 MED ORDER — ONDANSETRON 4 MG PO TBDP: 4.0000 mg | ORAL_TABLET | Freq: Three times a day (TID) | ORAL | Status: DC | PRN

## 2016-10-28 MED ORDER — VANCOMYCIN HCL IN DEXTROSE 1-5 GM/200ML-% IV SOLN
1000.0000 mg | Freq: Once | INTRAVENOUS | Status: AC
  Administered 2016-10-28: 1000 mg via INTRAVENOUS
  Filled 2016-10-28: qty 200

## 2016-10-28 MED ORDER — INSULIN ASPART 100 UNIT/ML ~~LOC~~ SOLN
5.0000 [IU] | Freq: Three times a day (TID) | SUBCUTANEOUS | Status: DC
  Administered 2016-10-29 (×2): 5 [IU] via SUBCUTANEOUS
  Filled 2016-10-28 (×2): qty 5

## 2016-10-28 MED ORDER — ONDANSETRON HCL 4 MG PO TABS: 4.0000 mg | ORAL_TABLET | Freq: Four times a day (QID) | ORAL | Status: DC | PRN

## 2016-10-28 MED ORDER — HYDROCODONE-ACETAMINOPHEN 7.5-325 MG PO TABS
1.0000 | ORAL_TABLET | Freq: Four times a day (QID) | ORAL | Status: DC | PRN
  Administered 2016-10-28 – 2016-10-31 (×7): 1 via ORAL
  Filled 2016-10-28 (×7): qty 1

## 2016-10-28 MED ORDER — ASPIRIN EC 81 MG PO TBEC
81.0000 mg | DELAYED_RELEASE_TABLET | Freq: Every day | ORAL | Status: DC
  Administered 2016-10-29 – 2016-10-31 (×3): 81 mg via ORAL
  Filled 2016-10-28 (×6): qty 1

## 2016-10-28 MED ORDER — INSULIN ASPART 100 UNIT/ML ~~LOC~~ SOLN
0.0000 [IU] | Freq: Three times a day (TID) | SUBCUTANEOUS | Status: DC
  Administered 2016-10-29: 1 [IU] via SUBCUTANEOUS
  Administered 2016-10-30 (×3): 2 [IU] via SUBCUTANEOUS
  Filled 2016-10-28: qty 1
  Filled 2016-10-28 (×3): qty 2

## 2016-10-28 MED ORDER — PIPERACILLIN-TAZOBACTAM 3.375 G IVPB
3.3750 g | Freq: Two times a day (BID) | INTRAVENOUS | Status: DC
  Administered 2016-10-29 (×2): 3.375 g via INTRAVENOUS
  Filled 2016-10-28 (×2): qty 50

## 2016-10-28 MED ORDER — PANTOPRAZOLE SODIUM 40 MG PO TBEC
40.0000 mg | DELAYED_RELEASE_TABLET | Freq: Every day | ORAL | Status: DC
  Administered 2016-10-29 – 2016-10-31 (×3): 40 mg via ORAL
  Filled 2016-10-28 (×3): qty 1

## 2016-10-28 MED ORDER — HEPARIN SODIUM (PORCINE) 5000 UNIT/ML IJ SOLN
5000.0000 [IU] | Freq: Three times a day (TID) | INTRAMUSCULAR | Status: DC
  Administered 2016-10-28 – 2016-10-31 (×7): 5000 [IU] via SUBCUTANEOUS
  Filled 2016-10-28 (×7): qty 1

## 2016-10-28 MED ORDER — INSULIN ASPART 100 UNIT/ML ~~LOC~~ SOLN
10.0000 [IU] | Freq: Once | SUBCUTANEOUS | Status: AC
  Administered 2016-10-28: 10 [IU] via INTRAVENOUS
  Filled 2016-10-28: qty 10

## 2016-10-28 MED ORDER — CARVEDILOL 6.25 MG PO TABS
3.1250 mg | ORAL_TABLET | Freq: Two times a day (BID) | ORAL | Status: DC
  Administered 2016-10-29 – 2016-10-31 (×4): 3.125 mg via ORAL
  Filled 2016-10-28 (×4): qty 1

## 2016-10-28 MED ORDER — INSULIN GLARGINE 100 UNIT/ML ~~LOC~~ SOLN
25.0000 [IU] | Freq: Every morning | SUBCUTANEOUS | Status: DC
  Administered 2016-10-29: 25 [IU] via SUBCUTANEOUS
  Filled 2016-10-28 (×3): qty 0.25

## 2016-10-28 MED ORDER — ALUM & MAG HYDROXIDE-SIMETH 200-200-20 MG/5ML PO SUSP: 30.0000 mL | Freq: Four times a day (QID) | ORAL | Status: DC | PRN

## 2016-10-28 MED ORDER — ONDANSETRON HCL 4 MG/2ML IJ SOLN: 4.0000 mg | Freq: Four times a day (QID) | INTRAMUSCULAR | Status: DC | PRN

## 2016-10-28 MED ORDER — INSULIN ASPART 100 UNIT/ML ~~LOC~~ SOLN
0.0000 [IU] | Freq: Every day | SUBCUTANEOUS | Status: DC
  Administered 2016-10-28: 4 [IU] via SUBCUTANEOUS
  Filled 2016-10-28: qty 4

## 2016-10-28 MED ORDER — BISACODYL 10 MG RE SUPP: 10.0000 mg | Freq: Every day | RECTAL | Status: DC | PRN

## 2016-10-28 MED ORDER — SODIUM CHLORIDE 0.9 % IV SOLN
INTRAVENOUS | Status: DC
  Administered 2016-10-28: 22:00:00 via INTRAVENOUS

## 2016-10-28 MED ORDER — PIPERACILLIN-TAZOBACTAM 3.375 G IVPB 30 MIN
3.3750 g | Freq: Once | INTRAVENOUS | Status: AC
  Administered 2016-10-28: 3.375 g via INTRAVENOUS
  Filled 2016-10-28: qty 50

## 2016-10-28 NOTE — ED Triage Notes (Signed)
[facility]. Pt had been admitted for flu/pneumonia. Pt diagnosed with kidney failure while in hospital. Pt has right knee surgery while in hospital.  Pt checked CBG today, reading over 400. Pt c/o dry mouth.

## 2016-10-28 NOTE — ED Provider Notes (Signed)
[facility] Emergency Department Provider Note  ____________________________________________  Time seen: Approximately 4:42 PM  I have reviewed the triage vital signs and the nursing notes.   HISTORY  Chief Complaint Hyperglycemia   HPI Juan Roberson. is a 46 y.o. male with a history of diabetes, CHF with EF of 20%, CKD recently placed on HD, HTN, DVT, CAD who presents for evaluation of hyperglycemia. Patient was discharged from the hospital yesterday after a prolonged hospitalization for flu, MRSA bacteremia and right knee septic joint. Patient is on vancomycin during dialysis. During the hospitalization patient had acute kidney injury and the was started on dialysis. He will start treatment on Tuesdays Thursdays and Friday. He has a central catheter that is new. He hasn't received outpatient dialysis yet. Today patient reports that he was feeling well however one daughter checked his blood glucose was elevated. She gave him insulin and he wouldn't come down. She was concerned and called EMS. Per EMS patient's blood glucose was above 400. Patient denies fever, chills, shortness of breath, cough, chest pain, abdominal pain, nausea, vomiting, diarrhea, dysuria. He still complaining of pain in his right knee however that has been stable since his arthroscopy.  Past Medical History:  Diagnosis Date  . Cellulitis and abscess of foot    Left-Dr. Ether Griffins  . CHF (congestive heart failure) (HCC)    EF 20%  . CKD (chronic kidney disease), stage III   . Diabetes mellitus without complication (HCC)    a. Dx ~ 1996.  [name] Dysrhythmia   . Essential hypertension   . Gastritis    a. 04/2015 hematemesis -> EGD: gastritis, esophagitis, duodenitis.  No active bleeding.  PPI added.  [name] GERD (gastroesophageal reflux disease)   . Left leg DVT (HCC)    a. Dx 05/2015 -> Coumadin.  . MI (myocardial infarction)   . Osteomyelitis (HCC)    a. 05/2015 L foot.    Patient Active  Problem List   Diagnosis Date Noted  . Hyperglycemia 10/28/2016  . Sepsis (HCC) 10/17/2016  . Chest pain 09/24/2016  . Acute-on-chronic kidney injury (HCC) 09/24/2016    Past Surgical History:  Procedure Laterality Date  . CARDIAC CATHETERIZATION Right 10/26/2016   Procedure: Left Heart Cath and Coronary Angiography;  Surgeon: Laurier Nancy, MD;  Location: [facility] INVASIVE CV LAB;  Service: Cardiovascular;  Laterality: Right;  . ESOPHAGOGASTRODUODENOSCOPY N/A 04/07/2015   Procedure: ESOPHAGOGASTRODUODENOSCOPY (EGD);  Surgeon: Midge Minium, MD;  Location: [facility] ENDOSCOPY;  Service: Endoscopy;  Laterality: N/A;  . ESOPHAGOGASTRODUODENOSCOPY (EGD) WITH PROPOFOL Left 06/30/2015   Procedure: ESOPHAGOGASTRODUODENOSCOPY (EGD) WITH PROPOFOL;  Surgeon: Christena Deem, MD;  Location: [facility] ENDOSCOPY;  Service: Endoscopy;  Laterality: Left;  . FOOT SURGERY    . I&D EXTREMITY Left 04/06/2015   Procedure: IRRIGATION AND DEBRIDEMENT EXTREMITY;  Surgeon: Gwyneth Revels, DPM;  Location: [facility] ORS;  Service: Podiatry;  Laterality: Left;  . IRRIGATION AND DEBRIDEMENT FOOT Left 05/21/2015   Procedure: IRRIGATION AND DEBRIDEMENT FOOT;  Surgeon: Linus Galas, MD;  Location: [facility] ORS;  Service: Podiatry;  Laterality: Left;  . IRRIGATION AND DEBRIDEMENT KNEE Right 10/18/2016   Procedure: IRRIGATION AND DEBRIDEMENT KNEE;  Surgeon: Deeann Saint, MD;  Location: [facility] ORS;  Service: Orthopedics;  Laterality: Right;  . KNEE ARTHROSCOPY  10/18/2016   Procedure: ARTHROSCOPY KNEE;  Surgeon: Deeann Saint, MD;  Location: [facility] ORS;  Service: Orthopedics;;    Prior to Admission medications   Medication Sig Start Date End Date Taking? Authorizing Provider  alum &  mag hydroxide-simeth (MAALOX/MYLANTA) 200-200-20 MG/5ML suspension Take 30 mLs by mouth every 6 (six) hours as needed for indigestion or heartburn. 09/25/16  Yes Shaune Pollack, MD  amiodarone (PACERONE) 200 MG tablet Take 1 tablet (200 mg total) by mouth daily. 09/25/16  Yes Shaune Pollack, MD  aspirin EC 81 MG EC tablet Take 1 tablet (81 mg total) by mouth daily. Patient taking differently: Take 325 mg by mouth daily.  07/23/16  Yes Richard Renae Gloss, MD  bisacodyl (DULCOLAX) 10 MG suppository Place 1 suppository (10 mg total) rectally daily as needed for moderate constipation. 10/27/16  Yes Altamese Dilling, MD  carvedilol (COREG) 3.125 MG tablet Take 1 tablet (3.125 mg total) by mouth 2 (two) times daily with a meal. 10/27/16  Yes Altamese Dilling, MD  HYDROcodone-acetaminophen (NORCO) 7.5-325 MG tablet Take 1 tablet by mouth every 6 (six) hours as needed for moderate pain (breakthrough pain). 10/27/16  Yes Altamese Dilling, MD  insulin glargine (LANTUS) 100 UNIT/ML injection Inject 0.09 mLs (9 Units total) into the skin at bedtime. 10/27/16  Yes Altamese Dilling, MD  Insulin Glargine (TOUJEO SOLOSTAR) 300 UNIT/ML SOPN Inject 25 Units into the skin every morning.   Yes Historical Provider, MD  methimazole (TAPAZOLE) 10 MG tablet Take 1 tablet (10 mg total) by mouth daily. 07/22/16  Yes Alford Highland, MD  Nutritional Supplements (FEEDING SUPPLEMENT, NEPRO CARB STEADY,) LIQD Take 237 mLs by mouth 2 (two) times daily between meals. 10/27/16  Yes Altamese Dilling, MD  ondansetron (ZOFRAN ODT) 4 MG disintegrating tablet Take 1 tablet (4 mg total) by mouth every 8 (eight) hours as needed for nausea or vomiting. 10/15/16  Yes Emily Filbert, MD  pantoprazole (PROTONIX) 40 MG tablet Take 1 tablet (40 mg total) by mouth daily. 09/25/16  Yes Shaune Pollack, MD  chlorpheniramine-HYDROcodone [facility]) 10-8 MG/5ML SUER Take 5 mLs by mouth 2 (two) times daily. Patient not taking: Reported on 10/17/2016 10/15/16   Emily Filbert, MD  vancomycin (VANCOCIN) 1-5 GM/200ML-% SOLN Inject 200 mLs (1,000 mg total) into the vein every dialysis. 10/27/16 11/29/16  Altamese Dilling, MD    Allergies Sulfur  Family History  Problem Relation Age of Onset  .  Coronary artery disease Father   . Pancreatic cancer Mother   . Breast cancer Sister   . Lung cancer Brother   . Cervical cancer Sister     Social History Social History  Substance Use Topics  . Smoking status: Never Smoker  . Smokeless tobacco: Never Used  . Alcohol use No    Review of Systems  Constitutional: Negative for fever. Eyes: Negative for visual changes. ENT: Negative for sore throat. Neck: No neck pain  Cardiovascular: Negative for chest pain. Respiratory: Negative for shortness of breath. Gastrointestinal: Negative for abdominal pain, vomiting or diarrhea. Genitourinary: Negative for dysuria. Musculoskeletal: Negative for back pain. Skin: Negative for rash. Neurological: Negative for headaches, weakness or numbness. Psych: No SI or HI  ____________________________________________   PHYSICAL EXAM:  VITAL SIGNS: ED Triage Vitals  Enc Vitals Group     BP 10/28/16 1622 123/72     Pulse Rate 10/28/16 1622 79     Resp 10/28/16 1622 17     Temp 10/28/16 1622 98.3 F (36.8 C)     Temp Source 10/28/16 1622 Oral     SpO2 10/28/16 1622 99 %     Weight 10/28/16 1623 197 lb (89.4 kg)     Height 10/28/16 1623 6\' 2"  (1.88 m)  Head Circumference --      Peak Flow --      Pain Score 10/28/16 1623 0     Pain Loc --      Pain Edu? --      Excl. in GC? --     Constitutional: Alert and oriented, jaundiced, no distress.  HEENT:      Head: Normocephalic and atraumatic.         Eyes: Conjunctivae are normal. Sclera is icteric. EOMI. PERRL      Mouth/Throat: Mucous membranes are moist.       Neck: Supple with no signs of meningismus. Cardiovascular: Regular rate and rhythm. No murmurs, gallops, or rubs. 2+ symmetrical distal pulses are present in all extremities. No JVD. Respiratory: Normal respiratory effort. Lungs are clear to auscultation bilaterally. No wheezes, crackles, or rhonchi.  Gastrointestinal: Soft, non tender, and non distended with positive bowel  sounds. No rebound or guarding. Musculoskeletal: R knee is warm to the touch with well healing incisions Neurologic: Normal speech and language. Face is symmetric. Moving all extremities. No gross focal neurologic deficits are appreciated. Skin: Skin is jaundiced. Psychiatric: Mood and affect are normal. Speech and behavior are normal.  ____________________________________________   LABS (all labs ordered are listed, but only abnormal results are displayed)  Labs Reviewed  CBC WITH DIFFERENTIAL/PLATELET - Abnormal; Notable for the following:       Result Value   WBC 12.2 (*)    RBC 3.00 (*)    Hemoglobin 8.1 (*)    HCT 25.6 (*)    MCHC 31.5 (*)    RDW 19.4 (*)    Platelets 147 (*)    Neutro Abs 10.3 (*)    Lymphs Abs 0.8 (*)    All other components within normal limits  COMPREHENSIVE METABOLIC PANEL - Abnormal; Notable for the following:    Sodium 128 (*)    Chloride 92 (*)    Glucose, Bld 497 (*)    BUN 76 (*)    Creatinine, Ser 3.65 (*)    Calcium 7.9 (*)    Total Protein 6.3 (*)    Albumin 2.0 (*)    ALT 9 (*)    Total Bilirubin 4.1 (*)    GFR calc non Af Amer 18 (*)    GFR calc Af Amer 21 (*)    All other components within normal limits  LACTIC ACID, PLASMA - Abnormal; Notable for the following:    Lactic Acid, Venous 2.4 (*)    All other components within normal limits  TROPONIN I - Abnormal; Notable for the following:    Troponin I 0.93 (*)    All other components within normal limits  GLUCOSE, CAPILLARY - Abnormal; Notable for the following:    Glucose-Capillary 432 (*)    All other components within normal limits  LACTIC ACID, PLASMA - Abnormal; Notable for the following:    Lactic Acid, Venous 3.3 (*)    All other components within normal limits  GLUCOSE, CAPILLARY - Abnormal; Notable for the following:    Glucose-Capillary 274 (*)    All other components within normal limits  CULTURE, BLOOD (ROUTINE X 2)  CULTURE, BLOOD (ROUTINE X 2)  BLOOD GAS, VENOUS   LACTIC ACID, PLASMA  CBG MONITORING, ED  CBG MONITORING, ED   ____________________________________________  EKG  none ____________________________________________  RADIOLOGY  CXR:  Stable cardiomegaly with left basilar atelectasis. Small posterior pleural effusions. Dialysis catheter with tip in the proximal right atrium. ____________________________________________   PROCEDURES  Procedure(s) performed: None Procedures Critical Care performed:  None ____________________________________________   INITIAL IMPRESSION / ASSESSMENT AND PLAN / ED COURSE   46 y.o. male with a history of diabetes, CHF with EF of 20%, CKD recently placed on HD, HTN, DVT, CAD and discharged yesterday after prolonged admission for flu, right knee septic joint and MRSA bacteremia who presents for evaluation of hyperglycemia. Patient was discharged yesterday, reports That he is feeling well however his daughter was concerned because his sugars were high today. No fever at home. Patient's physical exam is only concerning for a warm right knee which is the site where patient had an arthroscopy however patient tells me that the knee has been like this since the surgery. No worsening pain and decent range of motion. Patient is afebrile and has normal vital signs here. His glucose is 511. We'll say and blood work to rule out DKA or sepsis including lactic acid and blood cultures. We'll give gentle hydration as patient has EF of 20% and also given 10 units of IV insulin.   ED COURSE: White count trending up, lactate elevated at 2.4. Due to patient's CHF with EF of 20% patient was only given 500 cc bolus. Since patient has had a recent admission to the hospital concerning for bacteremia and now has a new central line for dialysis I will cover him broadly including a dose of vancomycin and Zosyn. Blood cultures are pending. Patient will be admitted to the hospitalist service.  Pertinent labs & imaging results that were  available during my care of the patient were reviewed by me and considered in my medical decision making (see chart for details).    ____________________________________________   FINAL CLINICAL IMPRESSION(S) / ED DIAGNOSES  Final diagnoses:  Hyperglycemia  Lactic acidosis  Leukocytosis, unspecified type  Metabolic acidosis      NEW MEDICATIONS STARTED DURING THIS VISIT:  New Prescriptions   No medications on file     Note:  This document was prepared using Dragon voice recognition software and may include unintentional dictation errors.    Nita Sickle, MD 10/28/16 2034

## 2016-10-28 NOTE — ED Notes (Signed)
Called floor RN. Informed RN that patient on the way and that patient's lactic acid increased to 3.3

## 2016-10-28 NOTE — ED Notes (Signed)
POCT CBG 274

## 2016-10-28 NOTE — H&P (Addendum)
Pt here with recent MRSA bacteremia, today presents with hyperglycemia.  Mildly elevated WBC but with some bandemia on differential and rising lactic acid.  Will very cautiously increase fluids (patient is ESRD and has severe CHF) for now and follow serial lactate until WNL, will continue abx for now - if AM labs are improved these could potentially be discontinued.  Kristeen MissWILLIS, Jaxn Chiquito FIELDING [facility] 10/28/2016, 8:33 PM

## 2016-10-28 NOTE — ED Notes (Signed)
MD Don PerkingVeronese applied dermabond and steri-strips to left femoral catheter site

## 2016-10-28 NOTE — ED Notes (Signed)
Patient reports he had temporary dialysis removed yesterday from left femoral that is currently leaking clear fluid at bandage site

## 2016-10-28 NOTE — Progress Notes (Signed)
Pharmacy Antibiotic Note  Juan BlossomWaltzie L Lobosco Jr. is a 46 y.o. male admitted on 10/28/2016 with Hyperglycemia and Sepsis.  Pharmacy has been consulted for Vancomycin and Zosyn dosing.  Patient was just discharged yesterday after being admitted with MRSA Joint infection/bacteremia. Patient was discharged on Vancomycin 1gm IV every HD (Tue, Thus, Sat).   Patient received Vancomycin 1gm IV in ED and Zosyn 3.375 IV in ED x 1 dose. Patient did not receive HD today.    Plan: Will continue patient on Vancomycin 1gm IV on HD days. Patient did receive additional Vancomycin 1 gm IV in ED. Will order vancomycin random level prior to next HD. Will follow patient daily to see if HD schedule changes while inpatient.   Will start patient on Zosyn 3.375 IV EI every 12 hours.   Height: 6\' 2"  (188 cm) Weight: 209 lb 12.8 oz (95.2 kg) IBW/kg (Calculated) : 82.2  Temp (24hrs), Avg:98.3 F (36.8 C), Min:98.2 F (36.8 C), Max:98.3 F (36.8 C)   Recent Labs Lab 10/23/16 0414 10/24/16 0455 10/24/16 1028 10/25/16 0429 10/26/16 0412 10/27/16 0410 10/28/16 1623 10/28/16 1641 10/28/16 1642 10/28/16 1941  WBC 9.3  --   --  9.7 10.6 10.3 12.2*  --   --   --   CREATININE 4.93* 3.56*  --  4.09* 4.60* 3.10*  --  3.65*  --   --   LATICACIDVEN  --   --   --   --   --   --   --   --  2.4* 3.3*  VANCORANDOM  --   --  23  --   --   --   --   --   --   --     Estimated Creatinine Clearance: 29.1 mL/min (by C-G formula based on SCr of 3.65 mg/dL (H)).    Allergies  Allergen Reactions  . Sulfur Other (See Comments)    Pt states that this medication causes renal failure.      Antimicrobials this admission: 1/16 vancomycin >>  1/27 Zosyn  >>   Dose adjustments this admission:  Microbiology results: 1/16 BCID/BCx: MRSA  1/27 BCx: pending   Thank you for allowing pharmacy to be a part of this patient's care.  Gardner CandleSheema M Carola Viramontes, PharmD, BCPS Clinical Pharmacist 10/28/2016 9:03 PM

## 2016-10-28 NOTE — H&P (Signed)
[facility] [place] at [facility]   PATIENT NAME: Juan Roberson    MR#:  [id number]  DATE OF BIRTH:  Aug 02, 1971  DATE OF ADMISSION:  10/28/2016  PRIMARY CARE PHYSICIAN: Corky Downs, MD   REQUESTING/REFERRING PHYSICIAN: Dr. Nita Sickle  CHIEF COMPLAINT:   Chief Complaint  Patient presents with  . Hyperglycemia    HISTORY OF PRESENT ILLNESS:  Juan Roberson  is a 46 y.o. male with a known history of CHF, chronic systolic, diabetes, hypertension, GERD, history of previous DVT, previous MI, recent hospitalization for MRSA bacteremia due to septic arthritis on the right knee, ESRD on hemodialysis who presents to the hospital due to uncontrolled blood sugars and hyperglycemia. Patient's sister said he checked his blood sugar home today and it read critically high. She rechecked it after giving him the insulin it's still said it was critically high. Patient takes 25 units of Tujeo insulin at home and took it this a.m. And despite that his BS were critically high and therefore EMS brought him to the ER for further evaluation.  The emergency room patient on routine blood work was noted to have a slightly elevated lactate, his white cell count is also slightly elevated. Hospitalist services were contacted further treatment and evaluation. Patient denies any fevers, chills, nausea, vomiting, abdominal pain, headache, dizziness, not, hematochezia or any other associated symptoms presently.  He had a left-sided temporary dialysis catheter which was removed prior to discharge and is currently draining some clear liquid.  PAST MEDICAL HISTORY:   Past Medical History:  Diagnosis Date  . Cellulitis and abscess of foot    Left-Dr. Ether Griffins  . CHF (congestive heart failure) (HCC)    EF 20%  . CKD (chronic kidney disease), stage III   . Diabetes mellitus without complication (HCC)    a. Dx ~ 1996.  [name] Dysrhythmia   . Essential hypertension   . Gastritis    a. 04/2015 hematemesis ->  EGD: gastritis, esophagitis, duodenitis.  No active bleeding.  PPI added.  [name] GERD (gastroesophageal reflux disease)   . Left leg DVT (HCC)    a. Dx 05/2015 -> Coumadin.  . MI (myocardial infarction)   . Osteomyelitis (HCC)    a. 05/2015 L foot.    PAST SURGICAL HISTORY:   Past Surgical History:  Procedure Laterality Date  . CARDIAC CATHETERIZATION Right 10/26/2016   Procedure: Left Heart Cath and Coronary Angiography;  Surgeon: Laurier Nancy, MD;  Location: [facility] INVASIVE CV LAB;  Service: Cardiovascular;  Laterality: Right;  . ESOPHAGOGASTRODUODENOSCOPY N/A 04/07/2015   Procedure: ESOPHAGOGASTRODUODENOSCOPY (EGD);  Surgeon: Midge Minium, MD;  Location: [facility] ENDOSCOPY;  Service: Endoscopy;  Laterality: N/A;  . ESOPHAGOGASTRODUODENOSCOPY (EGD) WITH PROPOFOL Left 06/30/2015   Procedure: ESOPHAGOGASTRODUODENOSCOPY (EGD) WITH PROPOFOL;  Surgeon: Christena Deem, MD;  Location: [facility] ENDOSCOPY;  Service: Endoscopy;  Laterality: Left;  . FOOT SURGERY    . I&D EXTREMITY Left 04/06/2015   Procedure: IRRIGATION AND DEBRIDEMENT EXTREMITY;  Surgeon: Gwyneth Revels, DPM;  Location: [facility] ORS;  Service: Podiatry;  Laterality: Left;  . IRRIGATION AND DEBRIDEMENT FOOT Left 05/21/2015   Procedure: IRRIGATION AND DEBRIDEMENT FOOT;  Surgeon: Linus Galas, MD;  Location: [facility] ORS;  Service: Podiatry;  Laterality: Left;  . IRRIGATION AND DEBRIDEMENT KNEE Right 10/18/2016   Procedure: IRRIGATION AND DEBRIDEMENT KNEE;  Surgeon: Deeann Saint, MD;  Location: [facility] ORS;  Service: Orthopedics;  Laterality: Right;  . KNEE ARTHROSCOPY  10/18/2016   Procedure: ARTHROSCOPY KNEE;  Surgeon: Deeann Saint, MD;  Location:  [facility] ORS;  Service: Orthopedics;;    SOCIAL HISTORY:   Social History  Substance Use Topics  . Smoking status: Never Smoker  . Smokeless tobacco: Never Used  . Alcohol use No    FAMILY HISTORY:   Family History  Problem Relation Age of Onset  . Coronary artery disease Father   . Pancreatic cancer Mother    . Breast cancer Sister   . Lung cancer Brother   . Cervical cancer Sister     DRUG ALLERGIES:   Allergies  Allergen Reactions  . Sulfur Other (See Comments)    Pt states that this medication causes renal failure.      REVIEW OF SYSTEMS:   Review of Systems  Constitutional: Negative for fever and weight loss.  HENT: Negative for congestion, nosebleeds and tinnitus.   Eyes: Negative for blurred vision, double vision and redness.  Respiratory: Negative for cough, hemoptysis and shortness of breath.   Cardiovascular: Negative for chest pain, orthopnea, leg swelling and PND.  Gastrointestinal: Negative for abdominal pain, diarrhea, melena, nausea and vomiting.  Genitourinary: Negative for dysuria, hematuria and urgency.  Musculoskeletal: Negative for falls and joint pain.  Neurological: Negative for dizziness, tingling, sensory change, focal weakness, seizures, weakness and headaches.  Endo/Heme/Allergies: Negative for polydipsia. Does not bruise/bleed easily.  Psychiatric/Behavioral: Negative for depression and memory loss. The patient is not nervous/anxious.     MEDICATIONS AT HOME:   Prior to Admission medications   Medication Sig Start Date End Date Taking? Authorizing Provider  alum & mag hydroxide-simeth (MAALOX/MYLANTA) 200-200-20 MG/5ML suspension Take 30 mLs by mouth every 6 (six) hours as needed for indigestion or heartburn. 09/25/16  Yes Shaune Pollack, MD  amiodarone (PACERONE) 200 MG tablet Take 1 tablet (200 mg total) by mouth daily. 09/25/16  Yes Shaune Pollack, MD  aspirin EC 81 MG EC tablet Take 1 tablet (81 mg total) by mouth daily. Patient taking differently: Take 325 mg by mouth daily.  07/23/16  Yes Richard Renae Gloss, MD  bisacodyl (DULCOLAX) 10 MG suppository Place 1 suppository (10 mg total) rectally daily as needed for moderate constipation. 10/27/16  Yes Altamese Dilling, MD  carvedilol (COREG) 3.125 MG tablet Take 1 tablet (3.125 mg total) by mouth 2 (two) times  daily with a meal. 10/27/16  Yes Altamese Dilling, MD  HYDROcodone-acetaminophen (NORCO) 7.5-325 MG tablet Take 1 tablet by mouth every 6 (six) hours as needed for moderate pain (breakthrough pain). 10/27/16  Yes Altamese Dilling, MD  insulin glargine (LANTUS) 100 UNIT/ML injection Inject 0.09 mLs (9 Units total) into the skin at bedtime. 10/27/16  Yes Altamese Dilling, MD  Insulin Glargine (TOUJEO SOLOSTAR) 300 UNIT/ML SOPN Inject 25 Units into the skin every morning.   Yes Historical Provider, MD  methimazole (TAPAZOLE) 10 MG tablet Take 1 tablet (10 mg total) by mouth daily. 07/22/16  Yes Alford Highland, MD  Nutritional Supplements (FEEDING SUPPLEMENT, NEPRO CARB STEADY,) LIQD Take 237 mLs by mouth 2 (two) times daily between meals. 10/27/16  Yes Altamese Dilling, MD  ondansetron (ZOFRAN ODT) 4 MG disintegrating tablet Take 1 tablet (4 mg total) by mouth every 8 (eight) hours as needed for nausea or vomiting. 10/15/16  Yes Emily Filbert, MD  pantoprazole (PROTONIX) 40 MG tablet Take 1 tablet (40 mg total) by mouth daily. 09/25/16  Yes Shaune Pollack, MD  chlorpheniramine-HYDROcodone [facility] ER) 10-8 MG/5ML SUER Take 5 mLs by mouth 2 (two) times daily. Patient not taking: Reported on 10/17/2016 10/15/16   Emily Filbert, MD  vancomycin (VANCOCIN) 1-5 GM/200ML-% SOLN Inject 200 mLs (1,000 mg total) into the vein every dialysis. 10/27/16 11/29/16  Altamese Dilling, MD      VITAL SIGNS:  Blood pressure 123/72, pulse 79, temperature 98.3 F (36.8 C), temperature source Oral, resp. rate 17, height 6\' 2"  (1.88 m), weight 89.4 kg (197 lb), SpO2 99 %.  PHYSICAL EXAMINATION:  Physical Exam  GENERAL:  46 y.o.-year-old patient lying in the bed in no acute distress.  EYES: Pupils equal, round, reactive to light and accommodation. No scleral icterus. Extraocular muscles intact.  HEENT: Head atraumatic, normocephalic. Oropharynx and nasopharynx clear. No oropharyngeal  erythema, moist oral mucosa  NECK:  Supple, no jugular venous distention. No thyroid enlargement, no tenderness.  LUNGS: Normal breath sounds bilaterally, no wheezing, rales, rhonchi. No use of accessory muscles of respiration.  CARDIOVASCULAR: S1, S2 RRR. No murmurs, rubs, gallops, clicks.  ABDOMEN: Soft, nontender, nondistended. Bowel sounds present. No organomegaly or mass.  EXTREMITIES: No pedal edema, cyanosis, or clubbing b/l. + 2 pedal & radial pulses b/l.  Bandages noted on the right knee from recent arthrocentesis but no acute swelling, redness or pain.  NEUROLOGIC: Cranial nerves II through XII are intact. No focal Motor or sensory deficits appreciated b/l. Globally weak.  PSYCHIATRIC: The patient is alert and oriented x 3. Good affect.  SKIN: No obvious rash, lesion, or ulcer.   LABORATORY PANEL:   CBC  Recent Labs Lab 10/28/16 1623  WBC 12.2*  HGB 8.1*  HCT 25.6*  PLT 147*   ------------------------------------------------------------------------------------------------------------------  Chemistries   Recent Labs Lab 10/28/16 1641  NA 128*  K 4.8  CL 92*  CO2 27  GLUCOSE 497*  BUN 76*  CREATININE 3.65*  CALCIUM 7.9*  AST 33  ALT 9*  ALKPHOS 111  BILITOT 4.1*   ------------------------------------------------------------------------------------------------------------------  Cardiac Enzymes  Recent Labs Lab 10/28/16 1623  TROPONINI 0.93*   ------------------------------------------------------------------------------------------------------------------  RADIOLOGY:  Dg Chest 2 View  Result Date: 10/28/2016 CLINICAL DATA:  Hyperglycemia, renal failure.  Assess for pneumonia. EXAM: CHEST  2 VIEW COMPARISON:  10/17/2016 FINDINGS: Unchanged cardiomegaly with small posterior pleural effusions and left basilar atelectasis. No pneumonic consolidation. No aortic aneurysm. Dialysis catheter noted with tip in the proximal right atrium. No pneumothorax. No  suspicious osseous abnormalities. IMPRESSION: Stable cardiomegaly with left basilar atelectasis. Small posterior pleural effusions. Dialysis catheter with tip in the proximal right atrium. Electronically Signed   By: Tollie Eth M.D.   On: 10/28/2016 17:14     IMPRESSION AND PLAN:   46 year old male with past medical history of chronic systolic CHF with diabetes, hypertension, end-stage renal disease on hemodialysis, essential hypertension, history of gastritis/GERD, recent admissions for MRSA bacteremia secondary to right knee septic arthritis who presents to the hospital due to uncontrolled blood sugars.  1. Hyperglycemia-patient's blood sugars have been significantly high since being discharged yesterday. -Patient has been compliant with his insulin. He takes 25 units of Toujeo insulin at home which I will continue.  - will also place him on Novolog 5 units TID w/ meals and also SSI coverage. Will get Diabetes coordinator consult. -Clinically there is no evidence of diabetic ketoacidosis or nonketotic hyperosmolar state.  2. Recent sepsis secondary to MRSA bacteremia from septic arthritis-patient's white cell count is slightly more elevated from discharge yesterday, and his lactate level is slightly elevated. I clinically do not think that the patient has worsening sepsis or any evidence of bacteremia presently. -I will continue his vancomycin to be dosed at hemodialysis. Patient  got an extra dose of vancomycin and Zosyn in the emergency room. -Follow fever curve, follow lactate level.  3. History of systolic CHF-patient clinically is not in congestive heart failure. -Continue Coreg.  4. End-stage renal disease on hemodialysis-no acute indication for dialysis presently. -Consult nephrology patient still in the hospital on Tuesday. She is on a Tuesday Thursday Saturday schedule.  5. History of atrial fibrillation-rate controlled. Continue carvedilol, amiodarone.  6. Hyperthyroidism - cont.  Methimazole.    7. GERD - cont. Protonix.   All the records are reviewed and case discussed with ED provider. Management plans discussed with the patient, family and they are in agreement.  CODE STATUS: Full code  TOTAL TIME TAKING CARE OF THIS PATIENT: 45 minutes.    Houston SirenSAINANI,VIVEK J M.D on 10/28/2016 at 6:49 PM  Between 7am to 6pm - Pager - [phone]  After 6pm go to [email or web address] - password [email or web address] [facility]  [facility]  Office  [phone]  CC: Primary care physician; Corky DownsMASOUD,JAVED, MD

## 2016-10-28 NOTE — ED Notes (Signed)
POCT CBG 510

## 2016-10-28 NOTE — ED Notes (Signed)
Called floor gave updated report

## 2016-10-29 LAB — GLUCOSE, CAPILLARY
GLUCOSE-CAPILLARY: 511 mg/dL — AB (ref 65–99)
GLUCOSE-CAPILLARY: 68 mg/dL (ref 65–99)
Glucose-Capillary: 132 mg/dL — ABNORMAL HIGH (ref 65–99)
Glucose-Capillary: 99 mg/dL (ref 65–99)
Glucose-Capillary: 109 mg/dL — ABNORMAL HIGH (ref 65–99)
Glucose-Capillary: 155 mg/dL — ABNORMAL HIGH (ref 65–99)

## 2016-10-29 LAB — CBC
HCT: 21.7 % — ABNORMAL LOW (ref 40.0–52.0)
HEMOGLOBIN: 7.2 g/dL — AB (ref 13.0–18.0)
MCH: 27.8 pg (ref 26.0–34.0)
MCHC: 33.4 g/dL (ref 32.0–36.0)
MCV: 83.3 fL (ref 80.0–100.0)
PLATELETS: 125 10*3/uL — AB (ref 150–440)
RBC: 2.61 MIL/uL — ABNORMAL LOW (ref 4.40–5.90)
RDW: 18.5 % — AB (ref 11.5–14.5)
WBC: 10.3 10*3/uL (ref 3.8–10.6)

## 2016-10-29 LAB — BASIC METABOLIC PANEL
ANION GAP: 8 (ref 5–15)
BUN: 78 mg/dL — AB (ref 6–20)
CALCIUM: 7.7 mg/dL — AB (ref 8.9–10.3)
CO2: 26 mmol/L (ref 22–32)
CREATININE: 3.78 mg/dL — AB (ref 0.61–1.24)
Chloride: 95 mmol/L — ABNORMAL LOW (ref 101–111)
GFR calc Af Amer: 20 mL/min — ABNORMAL LOW (ref >60)
GFR, EST NON AFRICAN AMERICAN: 18 mL/min — AB (ref >60)
GLUCOSE: 266 mg/dL — AB (ref 65–99)
Potassium: 4.7 mmol/L (ref 3.5–5.1)
Sodium: 129 mmol/L — ABNORMAL LOW (ref 135–145)

## 2016-10-29 LAB — BLOOD GAS, VENOUS
Acid-Base Excess: 0.8 mmol/L (ref 0.0–2.0)
BICARBONATE: 27.6 mmol/L (ref 20.0–28.0)
PATIENT TEMPERATURE: 37
PCO2 VEN: 56 mmHg (ref 44.0–60.0)
pH, Ven: 7.3 (ref 7.250–7.430)

## 2016-10-29 LAB — LACTIC ACID, PLASMA: LACTIC ACID, VENOUS: 1.9 mmol/L (ref 0.5–1.9)

## 2016-10-29 LAB — PREPARE RBC (CROSSMATCH)

## 2016-10-29 LAB — VANCOMYCIN, RANDOM: VANCOMYCIN RM: 13

## 2016-10-29 MED ORDER — VANCOMYCIN HCL IN DEXTROSE 1-5 GM/200ML-% IV SOLN
1000.0000 mg | Freq: Once | INTRAVENOUS | Status: AC
  Administered 2016-10-29: 1000 mg via INTRAVENOUS
  Filled 2016-10-29: qty 200

## 2016-10-29 MED ORDER — SALINE SPRAY 0.65 % NA SOLN
1.0000 | NASAL | Status: DC | PRN
  Filled 2016-10-29 (×2): qty 44

## 2016-10-29 MED ORDER — HYDROMORPHONE HCL 1 MG/ML IJ SOLN: 2.0000 mg | Freq: Once | INTRAMUSCULAR | Status: DC

## 2016-10-29 MED ORDER — FUROSEMIDE 80 MG PO TABS
80.0000 mg | ORAL_TABLET | Freq: Two times a day (BID) | ORAL | Status: DC
  Administered 2016-10-29 – 2016-10-31 (×3): 80 mg via ORAL
  Filled 2016-10-29 (×3): qty 1

## 2016-10-29 MED ORDER — MORPHINE SULFATE (PF) 2 MG/ML IV SOLN
2.0000 mg | INTRAVENOUS | Status: DC | PRN
  Administered 2016-10-29 – 2016-10-30 (×2): 2 mg via INTRAVENOUS
  Filled 2016-10-29 (×2): qty 1

## 2016-10-29 MED ORDER — HYDROMORPHONE HCL 1 MG/ML IJ SOLN
1.0000 mg | Freq: Once | INTRAMUSCULAR | Status: AC
  Administered 2016-10-29: 1 mg via INTRAVENOUS
  Filled 2016-10-29: qty 1

## 2016-10-29 MED ORDER — SODIUM CHLORIDE 0.9 % IV SOLN: Freq: Once | INTRAVENOUS | Status: DC

## 2016-10-29 MED ORDER — VANCOMYCIN HCL IN DEXTROSE 1-5 GM/200ML-% IV SOLN
1000.0000 mg | Freq: Once | INTRAVENOUS | Status: DC
  Filled 2016-10-29: qty 200

## 2016-10-29 NOTE — Progress Notes (Signed)
HD initiated without issue via R Chest Permcath. No heparin tx. Patient currently has no complaints. Medicated for knee pain prior to arrival to HD unit. "Its feeling better." per patient. Continue to monitor.

## 2016-10-29 NOTE — Progress Notes (Signed)
Pt's IV infiltrated. I attempted IV access without success x 2. Reported to oncoming nurse, she will try to get an IV.

## 2016-10-29 NOTE — Progress Notes (Signed)
[facility] - [place] at [facility]   PATIENT NAME: Juan Roberson    MR#:  [id number]  DATE OF BIRTH:  05/01/1971  SUBJECTIVE:  CHIEF COMPLAINT:   Chief Complaint  Patient presents with  . Hyperglycemia   - feels about the same, not symptomatic - sugars elevated on admission, better today - blood cultures pending  REVIEW OF SYSTEMS:  Review of Systems  Constitutional: Negative for chills, fever and malaise/fatigue.  HENT: Negative for congestion, ear discharge, hearing loss and nosebleeds.   Eyes: Negative for blurred vision and double vision.  Respiratory: Negative for cough, shortness of breath and wheezing.   Cardiovascular: Positive for leg swelling. Negative for chest pain and palpitations.  Gastrointestinal: Negative for abdominal pain, constipation, diarrhea, nausea and vomiting.  Genitourinary: Negative for dysuria.  Musculoskeletal: Positive for joint pain. Negative for myalgias.  Neurological: Negative for dizziness, speech change, focal weakness, seizures and headaches.  Psychiatric/Behavioral: Negative for depression.    DRUG ALLERGIES:   Allergies  Allergen Reactions  . Sulfur Other (See Comments)    Pt states that this medication causes renal failure.      VITALS:  Blood pressure 119/68, pulse 83, temperature 98.2 F (36.8 C), temperature source Oral, resp. rate 18, height 6\' 2"  (1.88 m), weight 95.2 kg (209 lb 12.8 oz), SpO2 100 %.  PHYSICAL EXAMINATION:  Physical Exam  GENERAL:  46 y.o.-year-old patient lying in the bed with no acute distress.  EYES: Pupils equal, round, reactive to light and accommodation. No scleral icterus. Extraocular muscles intact.  HEENT: Head atraumatic, normocephalic. Oropharynx and nasopharynx clear.  NECK:  Supple, no jugular venous distention. No thyroid enlargement, no tenderness.  LUNGS: Normal breath sounds bilaterally, no wheezing, rales,rhonchi or crepitation. No use of accessory muscles of  respiration. Decreased bibasilar breath sounds CARDIOVASCULAR: S1, S2 normal. No rubs, or gallops. 3/6 systolic murmur present. ABDOMEN: Soft, nontender, nondistended. Bowel sounds present. No organomegaly or mass.  EXTREMITIES: No cyanosis, or clubbing. 2+ pedal edema Right knee joint with bandages NEUROLOGIC: Cranial nerves II through XII are intact. Muscle strength 5/5 in all extremities. Sensation intact. Gait not checked.  PSYCHIATRIC: The patient is alert and oriented x 3.  SKIN: No obvious rash, lesion, or ulcer.    LABORATORY PANEL:   CBC  Recent Labs Lab 10/29/16 0017  WBC 10.3  HGB 7.2*  HCT 21.7*  PLT 125*   ------------------------------------------------------------------------------------------------------------------  Chemistries   Recent Labs Lab 10/28/16 1641 10/29/16 0017  NA 128* 129*  K 4.8 4.7  CL 92* 95*  CO2 27 26  GLUCOSE 497* 266*  BUN 76* 78*  CREATININE 3.65* 3.78*  CALCIUM 7.9* 7.7*  AST 33  --   ALT 9*  --   ALKPHOS 111  --   BILITOT 4.1*  --    ------------------------------------------------------------------------------------------------------------------  Cardiac Enzymes  Recent Labs Lab 10/28/16 1623  TROPONINI 0.93*   ------------------------------------------------------------------------------------------------------------------  RADIOLOGY:  Dg Chest 2 View  Result Date: 10/28/2016 CLINICAL DATA:  Hyperglycemia, renal failure.  Assess for pneumonia. EXAM: CHEST  2 VIEW COMPARISON:  10/17/2016 FINDINGS: Unchanged cardiomegaly with small posterior pleural effusions and left basilar atelectasis. No pneumonic consolidation. No aortic aneurysm. Dialysis catheter noted with tip in the proximal right atrium. No pneumothorax. No suspicious osseous abnormalities. IMPRESSION: Stable cardiomegaly with left basilar atelectasis. Small posterior pleural effusions. Dialysis catheter with tip in the proximal right atrium. Electronically  Signed   By: Tollie Ethavid  Kwon M.D.   On: 10/28/2016 17:14  EKG:   Orders placed or performed during the hospital encounter of 10/17/16  . EKG 12-Lead  . EKG 12-Lead    ASSESSMENT AND PLAN:   46 year old male with past medical history of chronic systolic CHF with diabetes, hypertension, end-stage renal disease on hemodialysis, essential hypertension, history of gastritis/GERD, recent admissions for MRSA bacteremia secondary to right knee septic arthritis who presents to the hospital due to uncontrolled blood sugars.  1. Uncontrolled diabetes mellitus with hyperglycemia- sugars are improving this morning. A1c is pending. -On Lantus 25 units and also NovoLog along with sliding scale. -  Diabetes coordinator consult. - Monitor closely. Also on carb controlled diet  2. Recent sepsis secondary to MRSA bacteremia from septic right knee - the blood cultures ordered on admission. -Continue vancomycin with hemodialysis. Since white count was elevated yesterday, Zosyn was added. Less likely to be repeated infection. If cultures are negative, discontinue Zosyn. WBC is improving.  3. History of systolic CHF-patient clinically is not in congestive heart failure. -Continue Coreg.  4. End-stage renal disease on hemodialysis-on Tuesday Thursday Saturday schedule. Missed his dialysis yesterday. Will consult nephrology for possible dialysis tomorrow if needed. Currently not fluid overloaded.  5. History of atrial fibrillation-rate controlled. Continue carvedilol, amiodarone. Not on anticoagulation due to his anemia -Continue baby aspirin  6. Hyperthyroidism - cont. Methimazole.    7. Acute on chronic anemia-anemia of chronic disease. Baseline hemoglobin used to be around 9, recent gradual drop noted. Currently at 7.2. Transfuse if hemoglobin is less than 7 tomorrow with dialysis. -No active bleeding at this time.   8. DVT prophylaxis-on subcutaneous heparin. If anemia worsens, until  discontinued   All the records are reviewed and case discussed with Care Management/Social Workerr. Management plans discussed with the patient, family and they are in agreement.  CODE STATUS:Full Code  TOTAL TIME TAKING CARE OF THIS PATIENT: 38 minutes.   POSSIBLE D/C IN 1-2 DAYS, DEPENDING ON CLINICAL CONDITION.   Enid Baas M.D on 10/29/2016 at 9:04 AM  Between 7am to 6pm - Pager - [phone]  After 6pm go to [email or web address] - Social research officer, government  [facility] [place] [facility]  Office  [phone]  CC: Primary care physician; Corky Downs, MD

## 2016-10-29 NOTE — Progress Notes (Signed)
Pharmacy Antibiotic Note  Juan BlossomWaltzie L Prestia Jr. is a 46 y.o. male admitted on 10/28/2016 with Hyperglycemia and Sepsis.  Pharmacy has been consulted for Vancomycin and Zosyn dosing.  Patient was just discharged 1/26 after being admitted with MRSA Joint infection/bacteremia. Patient was discharged on Vancomycin 1gm IV every HD (Tue, Thus, Sat).   Patient received Vancomycin 1gm IV in ED and Zosyn 3.375 IV in ED x 1 dose. Patient did not receive HD Saturday.    Plan: Patient had  HD today (1/28). Vancomycin random level after HD was 13.  Will order Vancomycin 1gm IV x 1 dose and will continue patient on Vancomycin 1gm IV on HD days. Patient did receive additional Vancomycin 1 gm IV in ED.    Will follow patient daily to see if HD schedule changes while inpatient.   Will continue patient on Zosyn 3.375 IV EI every 12 hours.   Height: 6\' 2"  (188 cm) Weight: 211 lb 13.8 oz (96.1 kg) IBW/kg (Calculated) : 82.2  Temp (24hrs), Avg:98.1 F (36.7 C), Min:97.8 F (36.6 C), Max:98.5 F (36.9 C)   Recent Labs Lab 10/24/16 1028 10/25/16 0429 10/26/16 0412 10/27/16 0410 10/28/16 1623 10/28/16 1641 10/28/16 1642 10/28/16 1941 10/29/16 0016 10/29/16 0017 10/29/16 1720  WBC  --  9.7 10.6 10.3 12.2*  --   --   --   --  10.3  --   CREATININE  --  4.09* 4.60* 3.10*  --  3.65*  --   --   --  3.78*  --   LATICACIDVEN  --   --   --   --   --   --  2.4* 3.3* 1.9  --   --   VANCORANDOM 23  --   --   --   --   --   --   --   --   --  13    Estimated Creatinine Clearance: 28.1 mL/min (by C-G formula based on SCr of 3.78 mg/dL (H)).    Allergies  Allergen Reactions  . Sulfur Other (See Comments)    Pt states that this medication causes renal failure.      Antimicrobials this admission: 1/16 vancomycin >>  1/27 Zosyn  >>   Dose adjustments this admission:  Microbiology results: 1/16 BCID/BCx: MRSA  1/27 BCx: pending   Thank you for allowing pharmacy to be a part of this patient's  care.  Gardner CandleSheema M Stella Encarnacion, PharmD, BCPS Clinical Pharmacist 10/29/2016 6:28 PM

## 2016-10-29 NOTE — Progress Notes (Signed)
HD completed without issue. UF 2L as ordered. Patient received 1 unit prbcs during treatment. No current complaints. Tolerated well.

## 2016-10-29 NOTE — Progress Notes (Signed)
Pt's CBG dropped to 68 from lunch time. At lunch he only received meal coverage and ate his meal, but CBG still dropped below 70 this evening. I called Dr. Cherlynn KaiserSainani (hosp on call) and per his order will hold meal coverage dose.

## 2016-10-29 NOTE — Progress Notes (Signed)
Called Dr.Willis about patient having no IV access and unable to give ABT on previous shift and this shift after several IV attempts; made Dr Hilliard ClarkSingh  Aware and MD informed writer  If patient can't get IV access this shift; patient will be scheduled for dialysis tomorrow and IV Vancomycin will be given at dialysis; Will continue to monitor and continue IV access placement.

## 2016-10-29 NOTE — Progress Notes (Signed)
Pt returned to floor from HD. Pt is alert and oriented x 4. Complains of pain to his R knee. VSS: BP (!) 145/73 (BP Location: Right Arm)   Pulse (!) 111   Temp 98.5 F (36.9 C) (Oral)   Resp (!) 22   Ht 6\' 2"  (1.88 m)   Wt 96.1 kg (211 lb 13.8 oz)   SpO2 98%   BMI 27.20 kg/m . Pt's CBG is 68, he is starting to eat dinner now. Will get a repeat CBG once he finishes dinner. Pt requesting pain medication once he finishes dinner.

## 2016-10-29 NOTE — Progress Notes (Signed)
[facility]  ROUNDING NOTE   Subjective:   Patient was discharged to [facility] on Friday but returned on Saturday due to extremely high uncontrolled blood sugars. Emergency room notes state that sugars were greater than 400 Patient appears to have large amount of edema His hemoglobin is down to 7.2. Hospitalist team is planning on blood transfusion therefore dialysis is requested.   Objective:  Vital signs in last 24 hours:  Temp:  [97.8 F (36.6 C)-98.3 F (36.8 C)] 98.2 F (36.8 C) (01/28 0823) Pulse Rate:  [74-83] 81 (01/28 1308) Resp:  [17-20] 18 (01/28 0823) BP: (101-123)/(53-77) 122/69 (01/28 1308) SpO2:  [98 %-100 %] 100 % (01/28 0823) Weight:  [89.4 kg (197 lb)-95.2 kg (209 lb 12.8 oz)] 95.2 kg (209 lb 12.8 oz) (01/27 2000)  Weight change:  Filed Weights   10/28/16 1623 10/28/16 2000  Weight: 89.4 kg (197 lb) 95.2 kg (209 lb 12.8 oz)    Intake/Output: I/O last 3 completed shifts: In: 721.3 [P.O.:480; I.V.:241.3] Out: 400 [Urine:400]   Intake/Output this shift:  No intake/output data recorded.  Physical Exam: General: NAD, laying in bed  Head: Normocephalic, atraumatic. Moist oral mucosal membranes. Temporal wasting  Eyes: Anicteric  Neck: Supple  Lungs:  Clear to auscultation, barrell chest  Heart: Regular rate and rhythm, +s3 gallop, no rub  Abdomen:  Soft, nontender  Extremities: ++ anasarca, right knee in bandage  Neurologic: Nonfocal, moving all four extremities  Skin: No lesions, multiple tattoos  Access: R IJ PC    Basic Metabolic Panel:  Recent Labs Lab 10/23/16 0414  10/25/16 0429 10/26/16 0412 10/27/16 0410 10/28/16 1641 10/29/16 0017  NA 134*  --  128* 126*  --  128* 129*  K 4.7  --  5.0 5.1  --  4.8 4.7  CL 101  --  93* 93*  --  92* 95*  CO2 19*  --  24 24  --  27 26  GLUCOSE 168*  --  292* 287*  --  497* 266*  BUN 90*  --  83* 99*  --  76* 78*  CREATININE 4.93*  < > 4.09* 4.60* 3.10* 3.65* 3.78*   CALCIUM 8.2*  --  8.2* 7.9*  --  7.9* 7.7*  < > = values in this interval not displayed.  Liver Function Tests:  Recent Labs Lab 10/28/16 1641  AST 33  ALT 9*  ALKPHOS 111  BILITOT 4.1*  PROT 6.3*  ALBUMIN 2.0*   No results for input(s): LIPASE, AMYLASE in the last 168 hours. No results for input(s): AMMONIA in the last 168 hours.  CBC:  Recent Labs Lab 10/25/16 0429 10/26/16 0412 10/27/16 0410 10/28/16 1623 10/29/16 0017  WBC 9.7 10.6 10.3 12.2* 10.3  NEUTROABS  --   --   --  10.3*  --   HGB 8.0* 7.8* 7.8* 8.1* 7.2*  HCT 23.7* 23.7* 23.2* 25.6* 21.7*  MCV 81.1 81.8 81.8 85.4 83.3  PLT 87* 98* 106* 147* 125*    Cardiac Enzymes:  Recent Labs Lab 10/24/16 2233 10/25/16 0429 10/25/16 1017 10/28/16 1623  TROPONINI 1.74* 1.87* 2.02* 0.93*    BNP: Invalid input(s): POCBNP  CBG:  Recent Labs Lab 10/28/16 1759 10/28/16 1943 10/28/16 2045 10/29/16 0825 10/29/16 1126  GLUCAP 432* 274* 330* 132* 99    Microbiology: Results for orders placed or performed during the hospital encounter of 10/17/16  Blood culture (routine x 2)     Status: Abnormal   Collection Time: 10/17/16  5:11 PM  Result Value Ref Range Status   Specimen Description BLOOD LEFT HAND  Final   Special Requests   Final    BOTTLES DRAWN AEROBIC AND ANAEROBIC AER 4ML ANA 4ML   Culture  Setup Time   Final    GRAM POSITIVE COCCI IN BOTH AEROBIC AND ANAEROBIC BOTTLES CRITICAL RESULT CALLED TO, READ BACK BY AND VERIFIED WITH: CHRISTINE KATSOUDAS 10/18/16 0925 SGD Performed at [facility], [address] [place], [place] [address]    Culture METHICILLIN RESISTANT STAPHYLOCOCCUS AUREUS (A)  Final   Report Status 10/20/2016 FINAL  Final   Organism ID, Bacteria METHICILLIN RESISTANT STAPHYLOCOCCUS AUREUS  Final      Susceptibility   Methicillin resistant staphylococcus aureus - MIC*    CIPROFLOXACIN >=8 RESISTANT Resistant     ERYTHROMYCIN >=8 RESISTANT Resistant     GENTAMICIN <=0.5  SENSITIVE Sensitive     OXACILLIN 2 RESISTANT Resistant     TETRACYCLINE <=1 SENSITIVE Sensitive     VANCOMYCIN <=0.5 SENSITIVE Sensitive     TRIMETH/SULFA <=10 SENSITIVE Sensitive     CLINDAMYCIN <=0.25 SENSITIVE Sensitive     RIFAMPIN <=0.5 SENSITIVE Sensitive     Inducible Clindamycin NEGATIVE Sensitive     * METHICILLIN RESISTANT STAPHYLOCOCCUS AUREUS  Blood Culture ID Panel (Reflexed)     Status: Abnormal   Collection Time: 10/17/16  5:11 PM  Result Value Ref Range Status   Enterococcus species NOT DETECTED NOT DETECTED Final   Listeria monocytogenes NOT DETECTED NOT DETECTED Final   Staphylococcus species DETECTED (A) NOT DETECTED Final    Comment: CRITICAL RESULT CALLED TO, READ BACK BY AND VERIFIED WITH: CHRISTINE KATSOUDAS 10/18/16 0925 SGD    Staphylococcus aureus DETECTED (A) NOT DETECTED Final    Comment: CRITICAL RESULT CALLED TO, READ BACK BY AND VERIFIED WITH: CHRISTINE KATSOUDAS 10/18/16 0925 SGD    Methicillin resistance DETECTED (A) NOT DETECTED Final    Comment: CRITICAL RESULT CALLED TO, READ BACK BY AND VERIFIED WITH: CHRISTINE KATSOUDAS 10/18/16 0925 SGD    Streptococcus species NOT DETECTED NOT DETECTED Final   Streptococcus agalactiae NOT DETECTED NOT DETECTED Final   Streptococcus pneumoniae NOT DETECTED NOT DETECTED Final   Streptococcus pyogenes NOT DETECTED NOT DETECTED Final   Acinetobacter baumannii NOT DETECTED NOT DETECTED Final   Enterobacteriaceae species NOT DETECTED NOT DETECTED Final   Enterobacter cloacae complex NOT DETECTED NOT DETECTED Final   Escherichia coli NOT DETECTED NOT DETECTED Final   Klebsiella oxytoca NOT DETECTED NOT DETECTED Final   Klebsiella pneumoniae NOT DETECTED NOT DETECTED Final   Proteus species NOT DETECTED NOT DETECTED Final   Serratia marcescens NOT DETECTED NOT DETECTED Final   Haemophilus influenzae NOT DETECTED NOT DETECTED Final   Neisseria meningitidis NOT DETECTED NOT DETECTED Final   Pseudomonas aeruginosa  NOT DETECTED NOT DETECTED Final   Candida albicans NOT DETECTED NOT DETECTED Final   Candida glabrata NOT DETECTED NOT DETECTED Final   Candida krusei NOT DETECTED NOT DETECTED Final   Candida parapsilosis NOT DETECTED NOT DETECTED Final   Candida tropicalis NOT DETECTED NOT DETECTED Final  Body fluid culture     Status: None   Collection Time: 10/17/16  6:22 PM  Result Value Ref Range Status   Specimen Description Right Knee  Final   Special Requests NONE  Final   Gram Stain   Final    MODERATE WBC PRESENT, PREDOMINANTLY PMN FEW GRAM POSITIVE COCCI IN PAIRS    Culture   Final  ABUNDANT METHICILLIN RESISTANT STAPHYLOCOCCUS AUREUS NO ANAEROBES ISOLATED Performed at [facility], [address], [place], [place] [address]    Report Status 10/20/2016 FINAL  Final   Organism ID, Bacteria METHICILLIN RESISTANT STAPHYLOCOCCUS AUREUS  Final      Susceptibility   Methicillin resistant staphylococcus aureus - MIC*    CIPROFLOXACIN >=8 RESISTANT Resistant     ERYTHROMYCIN >=8 RESISTANT Resistant     GENTAMICIN <=0.5 SENSITIVE Sensitive     OXACILLIN 2 RESISTANT Resistant     TETRACYCLINE <=1 SENSITIVE Sensitive     VANCOMYCIN <=0.5 SENSITIVE Sensitive     TRIMETH/SULFA <=10 SENSITIVE Sensitive     CLINDAMYCIN <=0.25 SENSITIVE Sensitive     RIFAMPIN <=0.5 SENSITIVE Sensitive     Inducible Clindamycin NEGATIVE Sensitive     * ABUNDANT METHICILLIN RESISTANT STAPHYLOCOCCUS AUREUS  Blood culture (routine x 2)     Status: None   Collection Time: 10/17/16  8:37 PM  Result Value Ref Range Status   Specimen Description BLOOD RIGHT ARM  Final   Special Requests BOTTLES DRAWN AEROBIC AND ANAEROBIC  9CC  Final   Culture NO GROWTH 5 DAYS  Final   Report Status 10/22/2016 FINAL  Final  MRSA PCR Screening     Status: Abnormal   Collection Time: 10/17/16  8:50 PM  Result Value Ref Range Status   MRSA by PCR POSITIVE (A) NEGATIVE Final    Comment:        The GeneXpert MRSA Assay  (FDA approved for NASAL specimens only), is one component of a comprehensive MRSA colonization surveillance program. It is not intended to diagnose MRSA infection nor to guide or monitor treatment for MRSA infections. RESULT CALLED TO, READ BACK BY AND VERIFIED WITH: VIVIAN ALI AT 2242 ON 10/17/2016 JLJ   Culture, blood (single) w Reflex to ID Panel     Status: None   Collection Time: 10/18/16 11:36 AM  Result Value Ref Range Status   Specimen Description BLOOD L HAND  Final   Special Requests BOTTLES DRAWN AEROBIC AND ANAEROBIC  Final   Culture NO GROWTH 5 DAYS  Final   Report Status 10/23/2016 FINAL  Final  Aerobic/Anaerobic Culture (surgical/deep wound)     Status: None   Collection Time: 10/18/16  9:18 PM  Result Value Ref Range Status   Specimen Description KNEE  Final   Special Requests NONE  Final   Gram Stain   Final    ABUNDANT WBC PRESENT, PREDOMINANTLY PMN MODERATE GRAM POSITIVE COCCI IN PAIRS IN CLUSTERS    Culture   Final    ABUNDANT METHICILLIN RESISTANT STAPHYLOCOCCUS AUREUS NO ANAEROBES ISOLATED Performed at [facility], [address], [place], [place] [address]    Report Status 10/24/2016 FINAL  Final   Organism ID, Bacteria METHICILLIN RESISTANT STAPHYLOCOCCUS AUREUS  Final      Susceptibility   Methicillin resistant staphylococcus aureus - MIC*    CIPROFLOXACIN >=8 RESISTANT Resistant     ERYTHROMYCIN >=8 RESISTANT Resistant     GENTAMICIN <=0.5 SENSITIVE Sensitive     OXACILLIN 2 RESISTANT Resistant     TETRACYCLINE <=1 SENSITIVE Sensitive     VANCOMYCIN 1 SENSITIVE Sensitive     TRIMETH/SULFA <=10 SENSITIVE Sensitive     CLINDAMYCIN <=0.25 SENSITIVE Sensitive     RIFAMPIN <=0.5 SENSITIVE Sensitive     Inducible Clindamycin NEGATIVE Sensitive     * ABUNDANT METHICILLIN RESISTANT STAPHYLOCOCCUS AUREUS    Coagulation Studies: No results for input(s): LABPROT,  INR in the last 72 hours.  Urinalysis: No results for input(s): COLORURINE,  LABSPEC, PHURINE, GLUCOSEU, HGBUR, BILIRUBINUR, KETONESUR, PROTEINUR, UROBILINOGEN, NITRITE, LEUKOCYTESUR in the last 72 hours.  Invalid input(s): APPERANCEUR    Imaging: Dg Chest 2 View  Result Date: 10/28/2016 CLINICAL DATA:  Hyperglycemia, renal failure.  Assess for pneumonia. EXAM: CHEST  2 VIEW COMPARISON:  10/17/2016 FINDINGS: Unchanged cardiomegaly with small posterior pleural effusions and left basilar atelectasis. No pneumonic consolidation. No aortic aneurysm. Dialysis catheter noted with tip in the proximal right atrium. No pneumothorax. No suspicious osseous abnormalities. IMPRESSION: Stable cardiomegaly with left basilar atelectasis. Small posterior pleural effusions. Dialysis catheter with tip in the proximal right atrium. Electronically Signed   By: Tollie Eth M.D.   On: 10/28/2016 17:14     Medications:    . sodium chloride   Intravenous Once  . amiodarone  200 mg Oral Daily  . aspirin EC  81 mg Oral Daily  . carvedilol  3.125 mg Oral BID WC  . feeding supplement (NEPRO CARB STEADY)  237 mL Oral BID BM  . heparin  5,000 Units Subcutaneous Q8H  . insulin aspart  0-5 Units Subcutaneous QHS  . insulin aspart  0-9 Units Subcutaneous TID WC  . insulin aspart  5 Units Subcutaneous TID WC  . insulin glargine  25 Units Subcutaneous q morning - 10a  . methimazole  10 mg Oral Daily  . pantoprazole  40 mg Oral Daily  . piperacillin-tazobactam (ZOSYN)  IV  3.375 g Intravenous Q12H  . [START ON 10/31/2016] vancomycin  1,000 mg Intravenous Q T,Th,Sa-HD   acetaminophen **OR** acetaminophen, alum & mag hydroxide-simeth, bisacodyl, HYDROcodone-acetaminophen, morphine injection, ondansetron **OR** ondansetron (ZOFRAN) IV, ondansetron, sodium chloride  Assessment/ Plan:  [name] is a 46 y.o. white male with past medical history of hypertension, insulin dependent diabetes mellitus type 2, diabetic peripheral neuropathy  1. Acute renal Failure with metabolic acidosis,  hyperkalemia and hyponatremia on chronic kidney disease stage IV with nephrotic range proteinuria and anasarca: baseline creatinine 3.28, GFR of 21 from 09/24/16.   Acute renal failure from sepsis - source MRSA from septic joint.   - Patient has a new finding of severe CHF.   - Cardiac cath revealed EF of 10%, chronically occluded mid LAD with collaterals, diffuse LV hypokinesis and dilated LV - permcath has been placed - outpatient dialysis for ARF scheduled at [facility] - HD today for volume removal and blood transfusion is planned during HD  2. Hypertension and anasarca -carvedilol  - start lasix - volume removal with HD  3. MRSA Knee infection Treated with iv vancomycin Total 6 weeks recommended till end of February 2018  4. 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 Chronic thrombocytopenia Noted off and on since Oct 2017  6. Anemia - Blood transfusion to be given with dialysis today     LOS: 0 Eloise Picone 1/28/20181:24 PM

## 2016-10-29 NOTE — Progress Notes (Signed)
Post HD assessment unchanged  

## 2016-10-29 NOTE — Progress Notes (Signed)
Pre HD  

## 2016-10-29 NOTE — Progress Notes (Signed)
New IV access and ABT resumed; no distress observed; Will continue to monitor

## 2016-10-29 NOTE — Progress Notes (Signed)
Transfusion completed without issue. Total 300cc added to goal to account for blood transfusion. Patient tolerated well no s/sx of transfusion reaction.

## 2016-10-29 NOTE — Progress Notes (Signed)
Pre HD assessment  

## 2016-10-30 ENCOUNTER — Encounter: Payer: Self-pay | Admitting: Vascular Surgery

## 2016-10-30 LAB — CBC
HCT: 24.1 % — ABNORMAL LOW (ref 40.0–52.0)
HEMOGLOBIN: 8 g/dL — AB (ref 13.0–18.0)
MCH: 27.9 pg (ref 26.0–34.0)
MCHC: 33.3 g/dL (ref 32.0–36.0)
MCV: 83.7 fL (ref 80.0–100.0)
Platelets: 113 10*3/uL — ABNORMAL LOW (ref 150–440)
RBC: 2.88 MIL/uL — AB (ref 4.40–5.90)
RDW: 18.2 % — ABNORMAL HIGH (ref 11.5–14.5)
WBC: 7 10*3/uL (ref 3.8–10.6)

## 2016-10-30 LAB — TYPE AND SCREEN
BLOOD PRODUCT EXPIRATION DATE: 201802222359
ISSUE DATE / TIME: 201801281613
UNIT TYPE AND RH: 6200

## 2016-10-30 LAB — GLUCOSE, CAPILLARY
GLUCOSE-CAPILLARY: 163 mg/dL — AB (ref 65–99)
GLUCOSE-CAPILLARY: 127 mg/dL — AB (ref 65–99)
Glucose-Capillary: 186 mg/dL — ABNORMAL HIGH (ref 65–99)
Glucose-Capillary: 165 mg/dL — ABNORMAL HIGH (ref 65–99)

## 2016-10-30 LAB — HEMOGLOBIN A1C
HEMOGLOBIN A1C: 9.9 % — AB (ref 4.8–5.6)
MEAN PLASMA GLUCOSE: 237 mg/dL

## 2016-10-30 MED ORDER — INSULIN ASPART 100 UNIT/ML ~~LOC~~ SOLN: 4.0000 [IU] | Freq: Three times a day (TID) | SUBCUTANEOUS | Status: DC

## 2016-10-30 MED ORDER — ZOLPIDEM TARTRATE 5 MG PO TABS
5.0000 mg | ORAL_TABLET | Freq: Every evening | ORAL | Status: DC | PRN
  Administered 2016-10-30: 5 mg via ORAL
  Filled 2016-10-30: qty 1

## 2016-10-30 MED ORDER — INSULIN GLARGINE 100 UNIT/ML ~~LOC~~ SOLN
18.0000 [IU] | Freq: Every morning | SUBCUTANEOUS | Status: DC
  Administered 2016-10-30 – 2016-10-31 (×2): 18 [IU] via SUBCUTANEOUS
  Filled 2016-10-30 (×3): qty 0.18

## 2016-10-30 NOTE — Care Management (Addendum)
Spoke with patient.  He says that when he arrived at [facility] on Friday it was filthy and smelled bad.  the patient that was going to be in his room was being cleaned because he had soiled himself and he just could not stay.  He is agreeable to home health services at discharge.  He is a new HD patient and had [facility] [address] [facility] No agency preference- "just so long as is in network with his insurance"  of [employer].  It appears patient was to have had Vancomycin during his dialysis treatments, a [employer] Vest was to be delivered to [facility] and there is documentation about need for a hoyer lift and pad for his outpatient dialysis.  CM has contacted [employer] to let them know patient is not at [facility].  Requested PT/OT consult. CM will need to assess for home DME.

## 2016-10-30 NOTE — Progress Notes (Signed)
Inpatient Diabetes Program Recommendations  AACE/ADA: New Consensus Statement on Inpatient Glycemic Control (2015)  Target Ranges:  Prepandial:   less than 140 mg/dL      Peak postprandial:   less than 180 mg/dL (1-2 hours)      Critically ill patients:  140 - 180 mg/dL   Lab Results  Component Value Date   GLUCAP 163 (H) 10/30/2016   HGBA1C 9.9 (H) 10/29/2016    Review of Glycemic Control:  Results for [name], Juan L JR. (MRN [id number]) as of 10/30/2016 09:27  Ref. Range 10/29/2016 11:26 10/29/2016 18:14 10/29/2016 18:47 10/29/2016 21:57 10/30/2016 07:55  Glucose-Capillary Latest Ref Range: 65 - 99 mg/dL 99 68 295109 (H) 621155 (H) 308163 (H)   Inpatient Diabetes Program Recommendations:    Consider reducing Novolog meal coverage to 4 units tid with meals.  Thanks, Beryl MeagerJenny Amritpal Shropshire, RN, BC-ADM Inpatient Diabetes Coordinator Pager [phone] (8a-5p)

## 2016-10-30 NOTE — Clinical Social Work Note (Signed)
Patient readmitted over the weekend. Previous CSW was contacted Friday early evening to say that patient had left [facility] just 2 hours after being admitted there and was going to go home.   RN CM informed this CSW that patient will be discharging home when time from this admission. Case closed. Please consult CSW if further CSW needs arise. York SpanielMonica Daine Gunther MSW,LCSW [phone]

## 2016-10-30 NOTE — Progress Notes (Signed)
[facility] [place] [facility]   PATIENT NAME: Juan Roberson    MR#:  [id number]  DATE OF BIRTH:  Jul 12, 1971  SUBJECTIVE:  CHIEF COMPLAINT:   Chief Complaint  Patient presents with  . Hyperglycemia   - sugars are better - significant right knee pain - dialysis again today, had it yesterday and also received 1 unit Tx yesterday  REVIEW OF SYSTEMS:  Review of Systems  Constitutional: Negative for chills, fever and malaise/fatigue.  HENT: Negative for congestion, ear discharge, hearing loss and nosebleeds.   Eyes: Negative for blurred vision and double vision.  Respiratory: Negative for cough, shortness of breath and wheezing.   Cardiovascular: Positive for leg swelling. Negative for chest pain and palpitations.  Gastrointestinal: Negative for abdominal pain, constipation, diarrhea, nausea and vomiting.  Genitourinary: Negative for dysuria.  Musculoskeletal: Positive for joint pain. Negative for myalgias.  Neurological: Negative for dizziness, speech change, focal weakness, seizures and headaches.  Psychiatric/Behavioral: Negative for depression.    DRUG ALLERGIES:   Allergies  Allergen Reactions  . Sulfur Other (See Comments)    Pt states that this medication causes renal failure.      VITALS:  Blood pressure 117/61, pulse 82, temperature 98.6 F (37 C), temperature source Oral, resp. rate 19, height 6\' 2"  (1.88 m), weight 96.1 kg (211 lb 13.8 oz), SpO2 97 %.  PHYSICAL EXAMINATION:  Physical Exam  GENERAL:  46 y.o.-year-old patient lying in the bed with no acute distress.  EYES: Pupils equal, round, reactive to light and accommodation. No scleral icterus. Extraocular muscles intact.  HEENT: Head atraumatic, normocephalic. Oropharynx and nasopharynx clear.  NECK:  Supple, no jugular venous distention. No thyroid enlargement, no tenderness.  LUNGS: Normal breath sounds bilaterally, no wheezing, rales,rhonchi or crepitation. No use of accessory  muscles of respiration. Decreased bibasilar breath sounds CARDIOVASCULAR: S1, S2 normal. No rubs, or gallops. 3/6 systolic murmur present. ABDOMEN: Soft, nontender, nondistended. Bowel sounds present. No organomegaly or mass.  EXTREMITIES: No cyanosis, or clubbing. 2+ pedal edema, right knee pain and swelling Right knee joint with bandages NEUROLOGIC: Cranial nerves II through XII are intact. Muscle strength 5/5 in all extremities. Sensation intact. Gait not checked.  PSYCHIATRIC: The patient is alert and oriented x 3.  SKIN: No obvious rash, lesion, or ulcer.    LABORATORY PANEL:   CBC  Recent Labs Lab 10/30/16 0527  WBC 7.0  HGB 8.0*  HCT 24.1*  PLT 113*   ------------------------------------------------------------------------------------------------------------------  Chemistries   Recent Labs Lab 10/28/16 1641 10/29/16 0017  NA 128* 129*  K 4.8 4.7  CL 92* 95*  CO2 27 26  GLUCOSE 497* 266*  BUN 76* 78*  CREATININE 3.65* 3.78*  CALCIUM 7.9* 7.7*  AST 33  --   ALT 9*  --   ALKPHOS 111  --   BILITOT 4.1*  --    ------------------------------------------------------------------------------------------------------------------  Cardiac Enzymes  Recent Labs Lab 10/28/16 1623  TROPONINI 0.93*   ------------------------------------------------------------------------------------------------------------------  RADIOLOGY:  Dg Chest 2 View  Result Date: 10/28/2016 CLINICAL DATA:  Hyperglycemia, renal failure.  Assess for pneumonia. EXAM: CHEST  2 VIEW COMPARISON:  10/17/2016 FINDINGS: Unchanged cardiomegaly with small posterior pleural effusions and left basilar atelectasis. No pneumonic consolidation. No aortic aneurysm. Dialysis catheter noted with tip in the proximal right atrium. No pneumothorax. No suspicious osseous abnormalities. IMPRESSION: Stable cardiomegaly with left basilar atelectasis. Small posterior pleural effusions. Dialysis catheter with tip in the  proximal right atrium. Electronically Signed   By: Onalee Hua  Sterling BigKwon M.D.   On: 10/28/2016 17:14    EKG:   Orders placed or performed during the hospital encounter of 10/17/16  . EKG 12-Lead  . EKG 12-Lead    ASSESSMENT AND PLAN:   46 year old male with past medical history of chronic systolic CHF with diabetes, hypertension, end-stage renal disease on hemodialysis, essential hypertension, history of gastritis/GERD, recent admissions for MRSA bacteremia secondary to right knee septic arthritis who presents to the hospital due to uncontrolled blood sugars.  1. Uncontrolled diabetes mellitus with hyperglycemia- sugars are improving. Actually low yesterday when insulin started. A1c is 9.9. - decrease Lantus to 18 units and also NovoLog along with sliding scale. -  Diabetes coordinator consult. - Monitor closely. Also on carb controlled diet  2. Recent sepsis secondary to MRSA bacteremia from septic right knee - the blood cultures ordered on admission. -Continue vancomycin with hemodialysis.  - discontinue zosyn as blood cultures are negative - icepack to knee for pain- PT/OT consult - ortho f/u as outpatient.  3. History of systolic CHF-patient clinically is not in congestive heart failure. -Continue Coreg.  4. End-stage renal disease on hemodialysis-on Tuesday Thursday Saturday schedule. Missed his dialysis last Saturday, received dialysis yesterday and again today due to fluid overload Nephrology consulted.  5. History of atrial fibrillation-rate controlled. Continue carvedilol, amiodarone. Not on anticoagulation due to his anemia -Continue baby aspirin  6. Hyperthyroidism - cont. Methimazole.    7. Acute on chronic anemia-anemia of chronic disease. Baseline hemoglobin used to be around 9, recent gradual drop noted.  -No active bleeding at this time.  -Received 1 unit of packed RBC for hemoglobin of 7 yesterday. Hemoglobin is 8 today  8. DVT prophylaxis-on subcutaneous  heparin.   All the records are reviewed and case discussed with Care Management/Social Workerr. Management plans discussed with the patient, family and they are in agreement.  CODE STATUS:Full Code  TOTAL TIME TAKING CARE OF THIS PATIENT: 38 minutes.   POSSIBLE D/C TOMORROW, DEPENDING ON CLINICAL CONDITION.   Enid BaasKALISETTI,Tj Kitchings M.D on 10/30/2016 at 1:24 PM  Between 7am to 6pm - Pager - [phone]  After 6pm go to [email or web address] - Social research officer, governmentpassword EPAS [facility]  [facility]  Office  [phone]  CC: Primary care physician; Corky DownsMASOUD,JAVED, MD

## 2016-10-30 NOTE — Progress Notes (Signed)
[facility]  ROUNDING NOTE   Subjective:  Patient resting comfortably in bed. Urine output was 610 cc over the preceding 24 hours.    Objective:  Vital signs in last 24 hours:  Temp:  [97.8 F (36.6 C)-99.2 F (37.3 C)] 98.6 F (37 C) (01/29 1235) Pulse Rate:  [81-115] 82 (01/29 1235) Resp:  [11-22] 19 (01/29 1235) BP: (97-145)/(58-84) 117/61 (01/29 1235) SpO2:  [96 %-99 %] 97 % (01/29 1235) Weight:  [96.1 kg (211 lb 13.8 oz)] 96.1 kg (211 lb 13.8 oz) (01/28 1744)  Weight change: 8.165 kg (18 lb) Filed Weights   10/28/16 2000 10/29/16 1442 10/29/16 1744  Weight: 95.2 kg (209 lb 12.8 oz) 97.5 kg (215 lb) 96.1 kg (211 lb 13.8 oz)    Intake/Output: I/O last 3 completed shifts: In: 1161.3 [P.O.:720; I.V.:341.3; IV Piggyback:100] Out: 3310 [Urine:1010; Other:2300]   Intake/Output this shift:  No intake/output data recorded.  Physical Exam: General: NAD, laying in bed  Head: Normocephalic, atraumatic. Moist oral mucosal membranes. Temporal wasting  Eyes: Anicteric  Neck: Supple  Lungs:  Clear to auscultation, barrell chest  Heart: Regular rate and rhythm  Abdomen:  Soft, nontender  Extremities: ++ anasarca, right knee in bandage  Neurologic: Nonfocal, moving all four extremities  Skin: No lesions, multiple tattoos  Access: R IJ PC    Basic Metabolic Panel:  Recent Labs Lab 10/25/16 0429 10/26/16 0412 10/27/16 0410 10/28/16 1641 10/29/16 0017  NA 128* 126*  --  128* 129*  K 5.0 5.1  --  4.8 4.7  CL 93* 93*  --  92* 95*  CO2 24 24  --  27 26  GLUCOSE 292* 287*  --  497* 266*  BUN 83* 99*  --  76* 78*  CREATININE 4.09* 4.60* 3.10* 3.65* 3.78*  CALCIUM 8.2* 7.9*  --  7.9* 7.7*    Liver Function Tests:  Recent Labs Lab 10/28/16 1641  AST 33  ALT 9*  ALKPHOS 111  BILITOT 4.1*  PROT 6.3*  ALBUMIN 2.0*   No results for input(s): LIPASE, AMYLASE in the last 168 hours. No results for input(s): AMMONIA in the last 168  hours.  CBC:  Recent Labs Lab 10/26/16 0412 10/27/16 0410 10/28/16 1623 10/29/16 0017 10/30/16 0527  WBC 10.6 10.3 12.2* 10.3 7.0  NEUTROABS  --   --  10.3*  --   --   HGB 7.8* 7.8* 8.1* 7.2* 8.0*  HCT 23.7* 23.2* 25.6* 21.7* 24.1*  MCV 81.8 81.8 85.4 83.3 83.7  PLT 98* 106* 147* 125* 113*    Cardiac Enzymes:  Recent Labs Lab 10/24/16 2233 10/25/16 0429 10/25/16 1017 10/28/16 1623  TROPONINI 1.74* 1.87* 2.02* 0.93*    BNP: Invalid input(s): POCBNP  CBG:  Recent Labs Lab 10/29/16 1814 10/29/16 1847 10/29/16 2157 10/30/16 0755 10/30/16 1136  GLUCAP 68 109* 155* 163* 186*    Microbiology: Results for orders placed or performed during the hospital encounter of 10/28/16  Blood culture (routine x 2)     Status: None (Preliminary result)   Collection Time: 10/28/16  4:30 PM  Result Value Ref Range Status   Specimen Description BLOOD  RIGHT ARM  Final   Special Requests   Final    BOTTLES DRAWN AEROBIC AND ANAEROBIC  AER 9 ML ANA 10 ML   Culture NO GROWTH 2 DAYS  Final   Report Status PENDING  Incomplete  Blood culture (routine x 2)     Status: None (Preliminary result)   Collection  Time: 10/28/16  4:40 PM  Result Value Ref Range Status   Specimen Description BLOOD  LEFT FIREARM  Final   Special Requests   Final    BOTTLES DRAWN AEROBIC AND ANAEROBIC  AER12 ML ANA 14 ML   Culture NO GROWTH 2 DAYS  Final   Report Status PENDING  Incomplete    Coagulation Studies: No results for input(s): LABPROT, INR in the last 72 hours.  Urinalysis: No results for input(s): COLORURINE, LABSPEC, PHURINE, GLUCOSEU, HGBUR, BILIRUBINUR, KETONESUR, PROTEINUR, UROBILINOGEN, NITRITE, LEUKOCYTESUR in the last 72 hours.  Invalid input(s): APPERANCEUR    Imaging: Dg Chest 2 View  Result Date: 10/28/2016 CLINICAL DATA:  Hyperglycemia, renal failure.  Assess for pneumonia. EXAM: CHEST  2 VIEW COMPARISON:  10/17/2016 FINDINGS: Unchanged cardiomegaly with small posterior  pleural effusions and left basilar atelectasis. No pneumonic consolidation. No aortic aneurysm. Dialysis catheter noted with tip in the proximal right atrium. No pneumothorax. No suspicious osseous abnormalities. IMPRESSION: Stable cardiomegaly with left basilar atelectasis. Small posterior pleural effusions. Dialysis catheter with tip in the proximal right atrium. Electronically Signed   By: Tollie Eth M.D.   On: 10/28/2016 17:14     Medications:    . sodium chloride   Intravenous Once  . amiodarone  200 mg Oral Daily  . aspirin EC  81 mg Oral Daily  . carvedilol  3.125 mg Oral BID WC  . feeding supplement (NEPRO CARB STEADY)  237 mL Oral BID BM  . furosemide  80 mg Oral BID  . heparin  5,000 Units Subcutaneous Q8H  . insulin aspart  0-5 Units Subcutaneous QHS  . insulin aspart  0-9 Units Subcutaneous TID WC  . insulin aspart  4 Units Subcutaneous TID WC  . insulin glargine  18 Units Subcutaneous q morning - 10a  . methimazole  10 mg Oral Daily  . pantoprazole  40 mg Oral Daily  . [START ON 10/31/2016] vancomycin  1,000 mg Intravenous Q T,Th,Sa-HD   acetaminophen **OR** acetaminophen, alum & mag hydroxide-simeth, bisacodyl, HYDROcodone-acetaminophen, morphine injection, ondansetron **OR** ondansetron (ZOFRAN) IV, ondansetron, sodium chloride, zolpidem  Assessment/ Plan:  [name] is a 46 y.o. white male with past medical history of hypertension, insulin dependent diabetes mellitus type 2, diabetic peripheral neuropathy  1. 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   Acute renal failure from sepsis - source MRSA from septic joint.  - Patient has a new finding of severe CHF.   - Cardiac cath revealed EF of 10%, chronically occluded mid LAD with collaterals, diffuse LV hypokinesis and dilated LV - permcath has  been placed  Plan:  Patient had hemodialysis yesterday.  We will plan for dialysis again tomorrow.  2. Hypertension and anasarca -Continue carvedilol as well as Lasix.  3. MRSA Knee infection Treated with iv vancomycin Total 6 weeks recommended till end of February 2018  4. 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 Chronic thrombocytopenia Noted off and on since Oct 2017  6. Anemia of CKD. - Hemoglobin up to 8.0 post transfusion.  Continue to monitor CBC.     LOS: 0 Kruti Horacek 1/29/20183:14 PM

## 2016-10-31 LAB — GLUCOSE, CAPILLARY
GLUCOSE-CAPILLARY: 101 mg/dL — AB (ref 65–99)
GLUCOSE-CAPILLARY: 104 mg/dL — AB (ref 65–99)

## 2016-10-31 LAB — PHOSPHORUS: PHOSPHORUS: 4 mg/dL (ref 2.5–4.6)

## 2016-10-31 LAB — BASIC METABOLIC PANEL
ANION GAP: 8 (ref 5–15)
BUN: 52 mg/dL — ABNORMAL HIGH (ref 6–20)
CALCIUM: 7.9 mg/dL — AB (ref 8.9–10.3)
CO2: 26 mmol/L (ref 22–32)
CREATININE: 3.26 mg/dL — AB (ref 0.61–1.24)
Chloride: 95 mmol/L — ABNORMAL LOW (ref 101–111)
GFR, EST AFRICAN AMERICAN: 24 mL/min — AB (ref >60)
GFR, EST NON AFRICAN AMERICAN: 21 mL/min — AB (ref >60)
Glucose, Bld: 87 mg/dL (ref 65–99)
Potassium: 4.6 mmol/L (ref 3.5–5.1)
SODIUM: 129 mmol/L — AB (ref 135–145)

## 2016-10-31 MED ORDER — INSULIN GLARGINE 300 UNIT/ML ~~LOC~~ SOPN: 18.0000 [IU] | PEN_INJECTOR | Freq: Every morning | SUBCUTANEOUS | 0 refills | Status: DC

## 2016-10-31 MED ORDER — HYDROCODONE-ACETAMINOPHEN 7.5-325 MG PO TABS: 1.0000 | ORAL_TABLET | Freq: Four times a day (QID) | ORAL | 0 refills | Status: DC | PRN

## 2016-10-31 MED ORDER — FUROSEMIDE 80 MG PO TABS: 80.0000 mg | ORAL_TABLET | Freq: Two times a day (BID) | ORAL | 2 refills | Status: DC

## 2016-10-31 MED ORDER — INSULIN ASPART 100 UNIT/ML ~~LOC~~ SOLN: 4.0000 [IU] | Freq: Three times a day (TID) | SUBCUTANEOUS | 11 refills | Status: DC

## 2016-10-31 NOTE — Progress Notes (Signed)
This note also relates to the following rows which could not be included: Pulse Rate - Cannot attach notes to unvalidated device data Resp - Cannot attach notes to unvalidated device data BP - Cannot attach notes to unvalidated device data SpO2 - Cannot attach notes to unvalidated device data  Hemodialsyis st arted

## 2016-10-31 NOTE — Progress Notes (Signed)
OT Cancellation Note  Patient Details Name: Queen BlossomWaltzie L Burnsed Jr. MRN: [id number] DOB: 09/14/1971   Cancelled Treatment:    Reason Eval/Treat Not Completed: Patient at procedure or test/ unavailable. Order received, chart briefly reviewed, pt currently receiving dialysis. Will re-attempt OT evaluation at later date/time as available.   Eliezer BottomJamie L Stiller, OTR/L 10/31/2016, 9:50 AM

## 2016-10-31 NOTE — Progress Notes (Signed)
Alert and oriented. Vital signs stable . No signs of acute distress. Discharge instructions given. Patient verbalized understanding. No other issues noted at this time.    

## 2016-10-31 NOTE — Progress Notes (Signed)
Hemodialysis completed. 

## 2016-10-31 NOTE — Progress Notes (Signed)
[facility]  ROUNDING NOTE   Subjective:  Overall urine output remains low. Patient completed hemodialysis today. Outpatient hemodialysis appears to be set for the moment.   Objective:  Vital signs in last 24 hours:  Temp:  [98 F (36.7 C)-98.7 F (37.1 C)] 98.3 F (36.8 C) (01/30 1411) Pulse Rate:  [80-123] 108 (01/30 1411) Resp:  [15-21] 18 (01/30 1411) BP: (106-150)/(60-92) 141/80 (01/30 1411) SpO2:  [94 %-99 %] 99 % (01/30 1411) Weight:  [95.9 kg (211 lb 6.7 oz)] 95.9 kg (211 lb 6.7 oz) (01/30 0940)  Weight change:  Filed Weights   10/29/16 1442 10/29/16 1744 10/31/16 0940  Weight: 97.5 kg (215 lb) 96.1 kg (211 lb 13.8 oz) 95.9 kg (211 lb 6.7 oz)    Intake/Output: I/O last 3 completed shifts: In: 290 [P.O.:240; IV Piggyback:50] Out: 385 [Urine:385]   Intake/Output this shift:  Total I/O In: 200 [IV Piggyback:200] Out: 1000 [Other:1000]  Physical Exam: General: NAD  Head: Normocephalic, atraumatic. Moist oral mucosal membranes. Temporal wasting  Eyes: Anicteric  Neck: Supple  Lungs:  Clear to auscultation, barrell chest  Heart: Regular rate and rhythm  Abdomen:  Soft, nontender  Extremities: ++ anasarca, right knee in bandage  Neurologic: Nonfocal, moving all four extremities  Skin: No lesions, multiple tattoos  Access: R IJ PC    Basic Metabolic Panel:  Recent Labs Lab 10/25/16 0429 10/26/16 0412 10/27/16 0410 10/28/16 1641 10/29/16 0017 10/31/16 0636 10/31/16 1028  NA 128* 126*  --  128* 129* 129*  --   K 5.0 5.1  --  4.8 4.7 4.6  --   CL 93* 93*  --  92* 95* 95*  --   CO2 24 24  --  27 26 26   --   GLUCOSE 292* 287*  --  497* 266* 87  --   BUN 83* 99*  --  76* 78* 52*  --   CREATININE 4.09* 4.60* 3.10* 3.65* 3.78* 3.26*  --   CALCIUM 8.2* 7.9*  --  7.9* 7.7* 7.9*  --   PHOS  --   --   --   --   --   --  4.0    Liver Function Tests:  Recent Labs Lab 10/28/16 1641  AST 33  ALT 9*  ALKPHOS 111  BILITOT 4.1*  PROT 6.3*   ALBUMIN 2.0*   No results for input(s): LIPASE, AMYLASE in the last 168 hours. No results for input(s): AMMONIA in the last 168 hours.  CBC:  Recent Labs Lab 10/26/16 0412 10/27/16 0410 10/28/16 1623 10/29/16 0017 10/30/16 0527  WBC 10.6 10.3 12.2* 10.3 7.0  NEUTROABS  --   --  10.3*  --   --   HGB 7.8* 7.8* 8.1* 7.2* 8.0*  HCT 23.7* 23.2* 25.6* 21.7* 24.1*  MCV 81.8 81.8 85.4 83.3 83.7  PLT 98* 106* 147* 125* 113*    Cardiac Enzymes:  Recent Labs Lab 10/24/16 2233 10/25/16 0429 10/25/16 1017 10/28/16 1623  TROPONINI 1.74* 1.87* 2.02* 0.93*    BNP: Invalid input(s): POCBNP  CBG:  Recent Labs Lab 10/30/16 0755 10/30/16 1136 10/30/16 1650 10/30/16 2128 10/31/16 0804  GLUCAP 163* 186* 165* 127* 101*    Microbiology: Results for orders placed or performed during the hospital encounter of 10/28/16  Blood culture (routine x 2)     Status: None (Preliminary result)   Collection Time: 10/28/16  4:30 PM  Result Value Ref Range Status   Specimen Description BLOOD  RIGHT ARM  Final   Special Requests   Final    BOTTLES DRAWN AEROBIC AND ANAEROBIC  AER 9 ML ANA 10 ML   Culture NO GROWTH 3 DAYS  Final   Report Status PENDING  Incomplete  Blood culture (routine x 2)     Status: None (Preliminary result)   Collection Time: 10/28/16  4:40 PM  Result Value Ref Range Status   Specimen Description BLOOD  LEFT FIREARM  Final   Special Requests   Final    BOTTLES DRAWN AEROBIC AND ANAEROBIC  AER12 ML ANA 14 ML   Culture NO GROWTH 3 DAYS  Final   Report Status PENDING  Incomplete    Coagulation Studies: No results for input(s): LABPROT, INR in the last 72 hours.  Urinalysis: No results for input(s): COLORURINE, LABSPEC, PHURINE, GLUCOSEU, HGBUR, BILIRUBINUR, KETONESUR, PROTEINUR, UROBILINOGEN, NITRITE, LEUKOCYTESUR in the last 72 hours.  Invalid input(s): APPERANCEUR    Imaging: No results found.   Medications:    . sodium chloride   Intravenous Once   . amiodarone  200 mg Oral Daily  . aspirin EC  81 mg Oral Daily  . carvedilol  3.125 mg Oral BID WC  . feeding supplement (NEPRO CARB STEADY)  237 mL Oral BID BM  . furosemide  80 mg Oral BID  . heparin  5,000 Units Subcutaneous Q8H  . insulin aspart  0-5 Units Subcutaneous QHS  . insulin aspart  0-9 Units Subcutaneous TID WC  . insulin aspart  4 Units Subcutaneous TID WC  . insulin glargine  18 Units Subcutaneous q morning - 10a  . methimazole  10 mg Oral Daily  . pantoprazole  40 mg Oral Daily  . vancomycin  1,000 mg Intravenous Q T,Th,Sa-HD   acetaminophen **OR** acetaminophen, alum & mag hydroxide-simeth, bisacodyl, HYDROcodone-acetaminophen, morphine injection, ondansetron **OR** ondansetron (ZOFRAN) IV, ondansetron, sodium chloride, zolpidem  Assessment/ Plan:  [name] is a 46 y.o. white male with past medical history of hypertension, insulin dependent diabetes mellitus type 2, diabetic peripheral neuropathy  1. 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   Acute renal failure from sepsis - source MRSA from septic joint.  - Patient has a new finding of severe CHF.   - Cardiac cath revealed EF of 10%, chronically occluded mid LAD with collaterals, diffuse LV hypokinesis and dilated LV - permcath has been placed  Plan:  Patient completed hemodialysis today.  Outpatient dialysis placement completed.  Unclear as to whether he will regain enough kidney function to come off of dialysis at the moment.  2. Hypertension and anasarca -Continue carvedilol given his underlying heart failure.  3. MRSA Knee infection Treated with iv vancomycin Total 6 weeks recommended till end of February 2018  4. 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 Chronic thrombocytopenia Noted off  and on since Oct 2017  6. Anemia of CKD. - Patient will likely need Epogen as an outpatient.  Continue to monitor CBC.     LOS: 0 Juan Roberson 1/30/20182:40 PM

## 2016-10-31 NOTE — Progress Notes (Signed)
Pre-hd tx 

## 2016-10-31 NOTE — Progress Notes (Signed)
Post hemodialysis:Tolerated 3.5hours of treatment with 1liter net removal.Scheduled Vancomycin given during treatment.Catheter lumens wrapped with gauze/taped.

## 2016-10-31 NOTE — Care Management (Signed)
Home health referral [facility] to [facility].  Christie notified.  RNCM signing off.

## 2016-10-31 NOTE — Discharge Summary (Signed)
[employer] [facility] [place] at [facility]   PATIENT NAME: Juan Roberson    MR#:  [id number]  DATE OF BIRTH:  1970/12/13  DATE OF ADMISSION:  10/28/2016   ADMITTING PHYSICIAN: Houston Siren, MD  DATE OF DISCHARGE: 10/31/16  PRIMARY CARE PHYSICIAN: MASOUD,JAVED, MD   ADMISSION DIAGNOSIS:   Lactic acidosis [E87.2] Metabolic acidosis [E87.2] Hyperglycemia [R73.9] Leukocytosis, unspecified type [D72.829]  DISCHARGE DIAGNOSIS:   Active Problems:   Hyperglycemia   SECONDARY DIAGNOSIS:   Past Medical History:  Diagnosis Date  . Cellulitis and abscess of foot    Left-Dr. Ether Griffins  . CHF (congestive heart failure) (HCC)    EF 20%  . CKD (chronic kidney disease), stage III   . Diabetes mellitus without complication (HCC)    a. Dx ~ 1996.  [name] Dysrhythmia   . Essential hypertension   . Gastritis    a. 04/2015 hematemesis -> EGD: gastritis, esophagitis, duodenitis.  No active bleeding.  PPI added.  [name] GERD (gastroesophageal reflux disease)   . Left leg DVT (HCC)    a. Dx 05/2015 -> Coumadin.  . MI (myocardial infarction)   . Osteomyelitis (HCC)    a. 05/2015 L foot.    HOSPITAL COURSE:   46 year old male with past medical history of chronic systolic CHF with diabetes, hypertension, end-stage renal disease on hemodialysis, essential hypertension, history of gastritis/GERD, recent admissions for MRSA bacteremia secondary to right knee septic arthritis who presents to the hospital due to uncontrolled blood sugars.  1. Uncontrolled diabetes mellitus with hyperglycemia- sugars are improving.  A1c is 9.9. - decreased Lantus to 18 units and also NovoLog due to episode of hypoglycemia in the hospital. - continue toujeou at discharge and also novolog -  Diabetes coordinator consulted. -  on carb controlled diet  2. Recent sepsis secondary to MRSA bacteremia from septic right knee - the blood cultures negative this time -Continue vancomycin with hemodialysis.  -  icepack to knee for pain- PT/OT consulted- though patient refused to work- he has a wheel chair at home - pain improving, ortho f/u in 1 week recommended  3. History of systolic CHF- recent cath last week revealed severe systolic CHF and EF dropped to 10% Will need ICD likely, life vest at discharge. Continue cardiac meds. On lasix if continues to void- minimal urine output though -Continue Coreg.  4. End-stage renal disease on hemodialysis-on Tuesday Thursday Saturday schedule.  Patient has been recently started on dialysis during his most recent admission, set up with outpatient dialysis, no signs of renal recovery yet. Continue outpt follow up. Now on schedule, last dialysis today Nephrology consulted.  5. History of atrial fibrillation-rate controlled. Continue carvedilol, amiodarone. Not on anticoagulation due to his anemia -Continue baby aspirin  6. Hyperthyroidism - cont. Methimazole.   7. Acute on chronic anemia-anemia of chronic disease. Baseline hemoglobin used to be around 9, recent gradual drop noted.  -No active bleeding at this time.  -Received 1 unit of packed RBC for hemoglobin of 7 on admission. Hemoglobin is 8 prior to discharge   PT consult pending, patient refused to get up, signed himself out from rehab last week. Refused home health initially, but agreed to home PT.  DISCHARGE CONDITIONS:   Very guarded  CONSULTS OBTAINED:   Treatment Team:  Mosetta Pigeon, MD  DRUG ALLERGIES:   Allergies  Allergen Reactions  . Sulfur Other (See Comments)    Pt states that this medication causes renal failure.     DISCHARGE MEDICATIONS:  Allergies as of 10/31/2016      Reactions   Sulfur Other (See Comments)   Pt states that this medication causes renal failure.        Medication List    STOP taking these medications   chlorpheniramine-HYDROcodone 10-8 MG/5ML Suer Commonly known as:  TUSSIONEX PENNKINETIC ER     TAKE these medications   alum & mag  hydroxide-simeth 200-200-20 MG/5ML suspension Commonly known as:  MAALOX/MYLANTA Take 30 mLs by mouth every 6 (six) hours as needed for indigestion or heartburn.   amiodarone 200 MG tablet Commonly known as:  PACERONE Take 1 tablet (200 mg total) by mouth daily.   aspirin 81 MG EC tablet Take 1 tablet (81 mg total) by mouth daily. What changed:  how much to take   bisacodyl 10 MG suppository Commonly known as:  DULCOLAX Place 1 suppository (10 mg total) rectally daily as needed for moderate constipation.   carvedilol 3.125 MG tablet Commonly known as:  COREG Take 1 tablet (3.125 mg total) by mouth 2 (two) times daily with a meal.   feeding supplement (NEPRO CARB STEADY) Liqd Take 237 mLs by mouth 2 (two) times daily between meals.   furosemide 80 MG tablet Commonly known as:  LASIX Take 1 tablet (80 mg total) by mouth 2 (two) times daily.   HYDROcodone-acetaminophen 7.5-325 MG tablet Commonly known as:  NORCO Take 1 tablet by mouth every 6 (six) hours as needed for moderate pain (breakthrough pain).   insulin aspart 100 UNIT/ML injection Commonly known as:  novoLOG Inject 4 Units into the skin 3 (three) times daily with meals.   Insulin Glargine 300 UNIT/ML Sopn Commonly known as:  TOUJEO SOLOSTAR Inject 18 Units into the skin every morning. What changed:  how much to take  Another medication with the same name was removed. Continue taking this medication, and follow the directions you see here.   methimazole 10 MG tablet Commonly known as:  TAPAZOLE Take 1 tablet (10 mg total) by mouth daily.   ondansetron 4 MG disintegrating tablet Commonly known as:  ZOFRAN ODT Take 1 tablet (4 mg total) by mouth every 8 (eight) hours as needed for nausea or vomiting.   pantoprazole 40 MG tablet Commonly known as:  PROTONIX Take 1 tablet (40 mg total) by mouth daily.   vancomycin 1-5 GM/200ML-% Soln Commonly known as:  VANCOCIN Inject 200 mLs (1,000 mg total) into the vein  every dialysis.            Durable Medical Equipment        Start     Ordered   10/31/16 1512  For home use only DME Bedside commode  Once    Question:  Patient needs a bedside commode to treat with the following condition  Answer:  Septic arthritis (HCC)   10/31/16 1511       DISCHARGE INSTRUCTIONS:   1. PCP f/u in 1 week 2. For dialysis in 2 days 3. Orthopedics f/u in 1-2 weeks  DIET:   Renal diet  ACTIVITY:   Activity as tolerated  OXYGEN:   Home Oxygen: No.  Oxygen Delivery: room air  DISCHARGE LOCATION:   home   If you experience worsening of your admission symptoms, develop shortness of breath, life threatening emergency, suicidal or homicidal thoughts you must seek medical attention immediately by calling 911 or calling your MD immediately  if symptoms less severe.  You Must read complete instructions/literature along with all the possible adverse reactions/side effects  for all the Medicines you take and that have been prescribed to you. Take any new Medicines after you have completely understood and accpet all the possible adverse reactions/side effects.   Please note  You were cared for by a hospitalist during your hospital stay. If you have any questions about your discharge medications or the care you received while you were in the hospital after you are discharged, you can call the unit and asked to speak with the hospitalist on call if the hospitalist that took care of you is not available. Once you are discharged, your primary care physician will handle any further medical issues. Please note that NO REFILLS for any discharge medications will be authorized once you are discharged, as it is imperative that you return to your primary care physician (or establish a relationship with a primary care physician if you do not have one) for your aftercare needs so that they can reassess your need for medications and monitor your lab values.    On the day of  Discharge:  VITAL SIGNS:   Blood pressure 134/70, pulse (!) 105, temperature 99.2 F (37.3 C), temperature source Oral, resp. rate 18, height 6\' 2"  (1.88 m), weight 95.9 kg (211 lb 6.7 oz), SpO2 100 %.  PHYSICAL EXAMINATION:    GENERAL:  46 y.o.-year-old patient lying in the bed with no acute distress.  EYES: Pupils equal, round, reactive to light and accommodation. No scleral icterus. Extraocular muscles intact.  HEENT: Head atraumatic, normocephalic. Oropharynx and nasopharynx clear.  NECK:  Supple, no jugular venous distention. No thyroid enlargement, no tenderness.  LUNGS: Normal breath sounds bilaterally, no wheezing, rales,rhonchi or crepitation. No use of accessory muscles of respiration. Decreased bibasilar breath sounds CARDIOVASCULAR: S1, S2 normal. No rubs, or gallops. 3/6 systolic murmur present. ABDOMEN: Soft, nontender, nondistended. Bowel sounds present. No organomegaly or mass.  EXTREMITIES: No cyanosis, or clubbing. 2+ pedal edema, right knee pain and swelling Right knee joint with bandages NEUROLOGIC: Cranial nerves II through XII are intact. Muscle strength 5/5 in all extremities. Sensation intact. Gait not checked.  PSYCHIATRIC: The patient is alert and oriented x 3.  SKIN: No obvious rash, lesion, or ulcer.   DATA REVIEW:   CBC  Recent Labs Lab 10/30/16 0527  WBC 7.0  HGB 8.0*  HCT 24.1*  PLT 113*    Chemistries   Recent Labs Lab 10/28/16 1641  10/31/16 0636  NA 128*  < > 129*  K 4.8  < > 4.6  CL 92*  < > 95*  CO2 27  < > 26  GLUCOSE 497*  < > 87  BUN 76*  < > 52*  CREATININE 3.65*  < > 3.26*  CALCIUM 7.9*  < > 7.9*  AST 33  --   --   ALT 9*  --   --   ALKPHOS 111  --   --   BILITOT 4.1*  --   --   < > = values in this interval not displayed.   Microbiology Results  Results for orders placed or performed during the hospital encounter of 10/28/16  Blood culture (routine x 2)     Status: None (Preliminary result)   Collection Time:  10/28/16  4:30 PM  Result Value Ref Range Status   Specimen Description BLOOD  RIGHT ARM  Final   Special Requests   Final    BOTTLES DRAWN AEROBIC AND ANAEROBIC  AER 9 ML ANA 10 ML   Culture NO GROWTH 3 DAYS  Final   Report Status PENDING  Incomplete  Blood culture (routine x 2)     Status: None (Preliminary result)   Collection Time: 10/28/16  4:40 PM  Result Value Ref Range Status   Specimen Description BLOOD  LEFT FIREARM  Final   Special Requests   Final    BOTTLES DRAWN AEROBIC AND ANAEROBIC  AER12 ML ANA 14 ML   Culture NO GROWTH 3 DAYS  Final   Report Status PENDING  Incomplete    RADIOLOGY:  No results found.   Management plans discussed with the patient, family and they are in agreement.  CODE STATUS:     Code Status Orders        Start     Ordered   10/28/16 2043  Full code  Continuous     10/28/16 2042    Code Status History    Date Active Date Inactive Code Status Order ID Comments User Context   10/17/2016  8:18 PM 10/27/2016  6:48 PM Full Code [id number]  Auburn Bilberry, MD Inpatient   09/24/2016  3:36 PM 09/25/2016  2:15 PM Full Code [id number]  Wyatt Haste, MD ED   07/21/2016 11:05 AM 07/22/2016  3:11 PM Full Code [id number]  Alford Highland, MD ED   02/28/2016  1:43 AM 03/01/2016  6:06 PM Full Code [id number]  Arnaldo Natal, MD Inpatient   12/06/2015  8:39 PM 12/08/2015  2:51 PM Full Code [id number]  Altamese Dilling, MD Inpatient   11/01/2015  4:00 PM 11/03/2015  2:28 PM Full Code [id number]  Wyatt Haste, MD ED   06/29/2015  2:30 AM 07/01/2015  2:21 PM Full Code [id number]  Arnaldo Natal, MD Inpatient   05/21/2015  4:34 PM 05/25/2015  4:34 PM Full Code [id number]  Linus Galas, MD Inpatient   05/18/2015  9:17 PM 05/21/2015  4:34 PM Full Code [id number]  Shaune Pollack, MD Inpatient   04/05/2015  7:05 AM 04/08/2015  2:00 PM Full Code [id number]  Arnaldo Natal, MD Inpatient    Advance Directive Documentation   Flowsheet Row Most Recent Value  Type of Advance Directive   Healthcare Power of Attorney  Pre-existing out of facility DNR order (yellow form or pink MOST form)  No data  "MOST" Form in Place?  No data      TOTAL TIME TAKING CARE OF THIS PATIENT: 37 minutes.    Sriya Kroeze M.D on 10/31/2016 at 5:04 PM  Between 7am to 6pm - Pager - [phone]  After 6pm go to [email or web address] - Social research officer, government  [facility] [place] [facility]  Office  [phone]  CC: Primary care physician; Corky Downs, MD   Note: This dictation was prepared with Dragon dictation along with smaller phrase technology. Any transcriptional errors that result from this process are unintentional.

## 2016-10-31 NOTE — Care Management (Signed)
Notified by Sharyl NimrodMeredith with [employer] that patient was been approved for the life vest.  Notified Judeth CornfieldStephanie - Secretary/administratorunit care manager.

## 2016-10-31 NOTE — Plan of Care (Signed)
Problem: Activity: Goal: Risk for activity intolerance will decrease Outcome: Not Progressing Patient refused to ambulate due to knee pain.  Patient will be working with PT.

## 2016-10-31 NOTE — Care Management (Signed)
Patient to be discharged today.  BSC to be delivered to room prior to discharge.  Patient states that he has RW and crutches in the home.  PCP MASOUD.  [facility] [employer].  Patient states that daughter will transport to outpatient HD.  Patient initially declined home health services. MD discussed services with patient and patient agreeable. Patient states that he does not have a home health agency preference.  [facility]  Nann to notify Sharyl NimrodMeredith with [employer] that patient to discharge today, and to fit patient outpatient.

## 2016-10-31 NOTE — Progress Notes (Signed)
Post hd tx 

## 2016-10-31 NOTE — Progress Notes (Signed)
PT Cancellation Note  Patient Details Name: Juan BlossomWaltzie L Sikorski Jr. MRN: [id number] DOB: 07/11/1971   Cancelled Treatment:    Reason Eval/Treat Not Completed: Patient at procedure or test/unavailable Pt is having dialysis, will try back this afternoon as pt is appropriate.    Malachi ProGalen R Ailynn Gow 10/31/2016, 11:34 AM

## 2016-11-01 NOTE — Care Management (Signed)
Notified by [facility] that patient could not be accepted due to life vest. RNCM also checked with [facility], [facility], [facility], [facility], [facility], [facility].  None of the above agencies were able to initiate services.  [facility] Mar[facility]Jodi [facility].  Hope Rife, and Dr Judithann SheenSparks notified.  Updated patient and daughter [place]Brittany.  [facility]

## 2016-11-02 LAB — CULTURE, BLOOD (ROUTINE X 2)
CULTURE: NO GROWTH
CULTURE: NO GROWTH

## 2016-11-22 ENCOUNTER — Emergency Department: Payer: BLUE CROSS/BLUE SHIELD

## 2016-11-22 ENCOUNTER — Emergency Department
Admission: EM | Admit: 2016-11-22 | Discharge: 2016-11-22 | Disposition: A | Discharge status: Home / Self Care | Payer: BLUE CROSS/BLUE SHIELD | Attending: Emergency Medicine | Admitting: Emergency Medicine

## 2016-11-22 DIAGNOSIS — R188 Other ascites: Secondary | ICD-10-CM | POA: Insufficient documentation

## 2016-11-22 DIAGNOSIS — I509 Heart failure, unspecified: Secondary | ICD-10-CM | POA: Insufficient documentation

## 2016-11-22 DIAGNOSIS — I130 Hypertensive heart and chronic kidney disease with heart failure and stage 1 through stage 4 chronic kidney disease, or unspecified chronic kidney disease: Secondary | ICD-10-CM | POA: Insufficient documentation

## 2016-11-22 DIAGNOSIS — Z7982 Long term (current) use of aspirin: Secondary | ICD-10-CM | POA: Diagnosis not present

## 2016-11-22 DIAGNOSIS — R1900 Intra-abdominal and pelvic swelling, mass and lump, unspecified site: Secondary | ICD-10-CM | POA: Diagnosis present

## 2016-11-22 DIAGNOSIS — R0600 Dyspnea, unspecified: Secondary | ICD-10-CM | POA: Diagnosis not present

## 2016-11-22 DIAGNOSIS — Z7984 Long term (current) use of oral hypoglycemic drugs: Secondary | ICD-10-CM | POA: Diagnosis not present

## 2016-11-22 DIAGNOSIS — N183 Chronic kidney disease, stage 3 (moderate): Secondary | ICD-10-CM | POA: Diagnosis not present

## 2016-11-22 DIAGNOSIS — Z79899 Other long term (current) drug therapy: Secondary | ICD-10-CM | POA: Diagnosis not present

## 2016-11-22 LAB — CBC
HCT: 26.6 % — ABNORMAL LOW (ref 40.0–52.0)
Hemoglobin: 8.5 g/dL — ABNORMAL LOW (ref 13.0–18.0)
MCH: 26.6 pg (ref 26.0–34.0)
MCHC: 31.8 g/dL — AB (ref 32.0–36.0)
MCV: 83.5 fL (ref 80.0–100.0)
Platelets: 219 10*3/uL (ref 150–440)
RBC: 3.18 MIL/uL — ABNORMAL LOW (ref 4.40–5.90)
RDW: 18.2 % — ABNORMAL HIGH (ref 11.5–14.5)
WBC: 6.8 10*3/uL (ref 3.8–10.6)

## 2016-11-22 LAB — COMPREHENSIVE METABOLIC PANEL
ALBUMIN: 2.1 g/dL — AB (ref 3.5–5.0)
ALT: 8 U/L — ABNORMAL LOW (ref 17–63)
AST: 19 U/L (ref 15–41)
Alkaline Phosphatase: 95 U/L (ref 38–126)
Anion gap: 7 (ref 5–15)
BILIRUBIN TOTAL: 1.6 mg/dL — AB (ref 0.3–1.2)
BUN: 38 mg/dL — ABNORMAL HIGH (ref 6–20)
CO2: 27 mmol/L (ref 22–32)
Calcium: 8.1 mg/dL — ABNORMAL LOW (ref 8.9–10.3)
Chloride: 101 mmol/L (ref 101–111)
Creatinine, Ser: 3.5 mg/dL — ABNORMAL HIGH (ref 0.61–1.24)
GFR calc Af Amer: 22 mL/min — ABNORMAL LOW (ref >60)
GFR calc non Af Amer: 19 mL/min — ABNORMAL LOW (ref >60)
GLUCOSE: 154 mg/dL — AB (ref 65–99)
POTASSIUM: 3.4 mmol/L — AB (ref 3.5–5.1)
Sodium: 135 mmol/L (ref 135–145)
TOTAL PROTEIN: 6.3 g/dL — AB (ref 6.5–8.1)

## 2016-11-22 LAB — LIPASE, BLOOD: Lipase: 41 U/L (ref 11–51)

## 2016-11-22 NOTE — ED Provider Notes (Signed)
Care signed out by Dr. Mayford KnifeWilliams pending ultrasound guided paracentesis. The patient is now back and had 5.14 L removed and he feels "like a new man". He has no abdominal discomfort and no tenderness. He is asking to go home. He has follow-up established. He is medically stable for outpatient management.   Merrily BrittleNeil Tieler Cournoyer, MD 11/22/16 1710

## 2016-11-22 NOTE — ED Triage Notes (Signed)
Pt reports that he came from his PCP today for swelling in abdomen. Pt reports that he has had to have fluid drawn off of stomach before. Increasing fluid over the last 3-4 days. SOB while lying down. Pt alert and oriented X4, active, cooperative, pt in NAD. RR even and unlabored, color WNL.

## 2016-11-22 NOTE — Discharge Instructions (Signed)
Please follow up with your primary care physician as scheduled. Return to the emergency department sooner for any new or worsening symptoms such as shortness of breath fevers chills or for any other concerns.

## 2016-11-22 NOTE — ED Provider Notes (Addendum)
[facility] Emergency Department Provider Note        Time seen: ----------------------------------------- 2:25 PM on 11/22/2016 -----------------------------------------    I have reviewed the triage vital signs and the nursing notes.   HISTORY  Chief Complaint Ascites    HPI Juan Roberson. is a 46 y.o. male who presents to ER for abdominal swelling. Patient reports he has had have fluid drawn from his abdominal cavity before, presumably a paracentesis. Patient reports worsening fluid for 3-4 days, shortness of breath with lying down. He denies fevers, chills, abdominal pain or other complaints at this time.   Past Medical History:  Diagnosis Date  . Cellulitis and abscess of foot    Left-Dr. Ether Griffins  . CHF (congestive heart failure) (HCC)    EF 20%  . CKD (chronic kidney disease), stage III   . Diabetes mellitus without complication (HCC)    a. Dx ~ 1996.  [name] Dysrhythmia   . Essential hypertension   . Gastritis    a. 04/2015 hematemesis -> EGD: gastritis, esophagitis, duodenitis.  No active bleeding.  PPI added.  [name] GERD (gastroesophageal reflux disease)   . Left leg DVT (HCC)    a. Dx 05/2015 -> Coumadin.  . MI (myocardial infarction)   . Osteomyelitis (HCC)    a. 05/2015 L foot.    Patient Active Problem List   Diagnosis Date Noted  . Hyperglycemia 10/28/2016  . Sepsis (HCC) 10/17/2016  . Chest pain 09/24/2016  . Acute-on-chronic kidney injury (HCC) 09/24/2016    Past Surgical History:  Procedure Laterality Date  . CARDIAC CATHETERIZATION Right 10/26/2016   Procedure: Left Heart Cath and Coronary Angiography;  Surgeon: Laurier Nancy, MD;  Location: [facility] INVASIVE CV LAB;  Service: Cardiovascular;  Laterality: Right;  . ESOPHAGOGASTRODUODENOSCOPY N/A 04/07/2015   Procedure: ESOPHAGOGASTRODUODENOSCOPY (EGD);  Surgeon: Midge Minium, MD;  Location: [facility] ENDOSCOPY;  Service: Endoscopy;  Laterality: N/A;  . ESOPHAGOGASTRODUODENOSCOPY (EGD)  WITH PROPOFOL Left 06/30/2015   Procedure: ESOPHAGOGASTRODUODENOSCOPY (EGD) WITH PROPOFOL;  Surgeon: Christena Deem, MD;  Location: [facility] ENDOSCOPY;  Service: Endoscopy;  Laterality: Left;  . FOOT SURGERY    . I&D EXTREMITY Left 04/06/2015   Procedure: IRRIGATION AND DEBRIDEMENT EXTREMITY;  Surgeon: Gwyneth Revels, DPM;  Location: [facility] ORS;  Service: Podiatry;  Laterality: Left;  . IRRIGATION AND DEBRIDEMENT FOOT Left 05/21/2015   Procedure: IRRIGATION AND DEBRIDEMENT FOOT;  Surgeon: Linus Galas, MD;  Location: [facility] ORS;  Service: Podiatry;  Laterality: Left;  . IRRIGATION AND DEBRIDEMENT KNEE Right 10/18/2016   Procedure: IRRIGATION AND DEBRIDEMENT KNEE;  Surgeon: Deeann Saint, MD;  Location: [facility] ORS;  Service: Orthopedics;  Laterality: Right;  . KNEE ARTHROSCOPY  10/18/2016   Procedure: ARTHROSCOPY KNEE;  Surgeon: Deeann Saint, MD;  Location: [facility] ORS;  Service: Orthopedics;;  . PERIPHERAL VASCULAR CATHETERIZATION N/A 10/27/2016   Procedure: Dialysis/Perma Catheter Insertion;  Surgeon: Annice Needy, MD;  Location: [facility] INVASIVE CV LAB;  Service: Cardiovascular;  Laterality: N/A;    Allergies Sulfur  Social History Social History  Substance Use Topics  . Smoking status: Never Smoker  . Smokeless tobacco: Never Used  . Alcohol use No    Review of Systems Constitutional: Negative for fever. Cardiovascular: Negative for chest pain. Respiratory: Negative for shortness of breath. Gastrointestinal: Negative for abdominal pain, Positive for abdominal distention Genitourinary: Negative for dysuria. Musculoskeletal: Negative for back pain. Skin: Negative for rash. Neurological: Negative for headaches, focal weakness or numbness.  10-point ROS otherwise negative.  ____________________________________________  PHYSICAL EXAM:  VITAL SIGNS: ED Triage Vitals [11/22/16 1308]  Enc Vitals Group     BP 134/79     Pulse Rate 86     Resp 18     Temp 99.1 F (37.3 C)     Temp Source Oral      SpO2 98 %     Weight 186 lb (84.4 kg)     Height 6\' 2"  (1.88 m)     Head Circumference      Peak Flow      Pain Score      Pain Loc      Pain Edu?      Excl. in GC?     Constitutional: Alert and oriented. Chronically ill appearing Eyes: Conjunctivae are normal. PERRL. Normal extraocular movements. ENT   Head: Normocephalic and atraumatic.   Nose: No congestion/rhinnorhea.   Mouth/Throat: Mucous membranes are moist.   Neck: No stridor. Cardiovascular: Normal rate, regular rhythm. No murmurs, rubs, or gallops. Respiratory: Normal respiratory effort without tachypnea nor retractions. Breath sounds are clear and equal bilaterally. No wheezes/rales/rhonchi. Gastrointestinal: Abdominal distention with ascites, nontender Musculoskeletal: Nontender with normal range of motion in all extremities. edema is noted Neurologic:  Normal speech and language. No gross focal neurologic deficits are appreciated.  Skin:  Skin is warm, dry and intact. No rash noted. Psychiatric: Mood and affect are normal. Speech and behavior are normal.  ____________________________________________  ED COURSE:  Pertinent labs & imaging results that were available during my care of the patient were reviewed by me and considered in my medical decision making (see chart for details). Patient presents to the ER in no distress with abdominal distention. He has been sent here for paracentesis. We will order an ultrasound-guided paracentesis.   Procedures ____________________________________________   LABS (pertinent positives/negatives)  Labs Reviewed  COMPREHENSIVE METABOLIC PANEL - Abnormal; Notable for the following:       Result Value   Potassium 3.4 (*)    Glucose, Bld 154 (*)    BUN 38 (*)    Creatinine, Ser 3.50 (*)    Calcium 8.1 (*)    Total Protein 6.3 (*)    Albumin 2.1 (*)    ALT 8 (*)    Total Bilirubin 1.6 (*)    GFR calc non Af Amer 19 (*)    GFR calc Af Amer 22 (*)    All  other components within normal limits  CBC - Abnormal; Notable for the following:    RBC 3.18 (*)    Hemoglobin 8.5 (*)    HCT 26.6 (*)    MCHC 31.8 (*)    RDW 18.2 (*)    All other components within normal limits  LIPASE, BLOOD    RADIOLOGY  Ultrasound-guided paracentesis pending  ____________________________________________  FINAL ASSESSMENT AND PLAN  Ascites  Plan: Patient with labs and imaging as dictated above. Patient disposition is pending paracentesis.   Emily FilbertWilliams, Zamiah Tollett E, MD   Note: This note was generated in part or whole with voice recognition software. Voice recognition is usually quite accurate but there are transcription errors that can and very often do occur. I apologize for any typographical errors that were not detected and corrected.     Emily FilbertJonathan E Krystine Pabst, MD 11/22/16 1434    Emily FilbertJonathan E Jamespaul Secrist, MD 11/22/16 [phone]

## 2016-11-22 NOTE — ED Notes (Signed)
Patient transported to US 

## 2016-11-22 NOTE — Procedures (Signed)
US guided paracentesis No comp/EBL

## 2016-11-27 ENCOUNTER — Encounter (INDEPENDENT_AMBULATORY_CARE_PROVIDER_SITE_OTHER): Payer: Self-pay

## 2016-11-29 ENCOUNTER — Other Ambulatory Visit (INDEPENDENT_AMBULATORY_CARE_PROVIDER_SITE_OTHER): Payer: Self-pay

## 2016-12-05 ENCOUNTER — Encounter
Admission: RE | Disposition: A | Discharge status: Home / Self Care | Payer: Self-pay | Source: Ambulatory Visit | Attending: Vascular Surgery

## 2016-12-05 ENCOUNTER — Encounter: Payer: Self-pay | Admitting: Emergency Medicine

## 2016-12-05 ENCOUNTER — Emergency Department: Payer: BLUE CROSS/BLUE SHIELD

## 2016-12-05 ENCOUNTER — Ambulatory Visit
Admission: RE | Admit: 2016-12-05 | Discharge: 2016-12-05 | Disposition: A | Discharge status: Home / Self Care | Payer: BLUE CROSS/BLUE SHIELD | Source: Ambulatory Visit | Attending: Vascular Surgery | Admitting: Vascular Surgery

## 2016-12-05 ENCOUNTER — Ambulatory Visit: Payer: BLUE CROSS/BLUE SHIELD

## 2016-12-05 ENCOUNTER — Inpatient Hospital Stay
Admission: EM | Admit: 2016-12-05 | Discharge: 2016-12-08 | DRG: 371 | Disposition: A | Discharge status: Home / Self Care | Payer: BLUE CROSS/BLUE SHIELD | Attending: Internal Medicine | Admitting: Internal Medicine

## 2016-12-05 DIAGNOSIS — R0602 Shortness of breath: Secondary | ICD-10-CM

## 2016-12-05 DIAGNOSIS — N184 Chronic kidney disease, stage 4 (severe): Secondary | ICD-10-CM | POA: Insufficient documentation

## 2016-12-05 DIAGNOSIS — D638 Anemia in other chronic diseases classified elsewhere: Secondary | ICD-10-CM | POA: Diagnosis present

## 2016-12-05 DIAGNOSIS — Z79899 Other long term (current) drug therapy: Secondary | ICD-10-CM | POA: Insufficient documentation

## 2016-12-05 DIAGNOSIS — J449 Chronic obstructive pulmonary disease, unspecified: Secondary | ICD-10-CM | POA: Diagnosis present

## 2016-12-05 DIAGNOSIS — Z888 Allergy status to other drugs, medicaments and biological substances status: Secondary | ICD-10-CM | POA: Insufficient documentation

## 2016-12-05 DIAGNOSIS — E876 Hypokalemia: Secondary | ICD-10-CM | POA: Diagnosis present

## 2016-12-05 DIAGNOSIS — K219 Gastro-esophageal reflux disease without esophagitis: Secondary | ICD-10-CM | POA: Diagnosis present

## 2016-12-05 DIAGNOSIS — X58XXXA Exposure to other specified factors, initial encounter: Secondary | ICD-10-CM | POA: Insufficient documentation

## 2016-12-05 DIAGNOSIS — I130 Hypertensive heart and chronic kidney disease with heart failure and stage 1 through stage 4 chronic kidney disease, or unspecified chronic kidney disease: Secondary | ICD-10-CM | POA: Diagnosis present

## 2016-12-05 DIAGNOSIS — R52 Pain, unspecified: Secondary | ICD-10-CM

## 2016-12-05 DIAGNOSIS — Z881 Allergy status to other antibiotic agents status: Secondary | ICD-10-CM | POA: Diagnosis not present

## 2016-12-05 DIAGNOSIS — Z86718 Personal history of other venous thrombosis and embolism: Secondary | ICD-10-CM | POA: Diagnosis not present

## 2016-12-05 DIAGNOSIS — J9819 Other pulmonary collapse: Secondary | ICD-10-CM | POA: Diagnosis present

## 2016-12-05 DIAGNOSIS — Z882 Allergy status to sulfonamides status: Secondary | ICD-10-CM

## 2016-12-05 DIAGNOSIS — Z794 Long term (current) use of insulin: Secondary | ICD-10-CM

## 2016-12-05 DIAGNOSIS — Z95828 Presence of other vascular implants and grafts: Secondary | ICD-10-CM

## 2016-12-05 DIAGNOSIS — E1122 Type 2 diabetes mellitus with diabetic chronic kidney disease: Secondary | ICD-10-CM | POA: Diagnosis not present

## 2016-12-05 DIAGNOSIS — I5023 Acute on chronic systolic (congestive) heart failure: Secondary | ICD-10-CM | POA: Diagnosis present

## 2016-12-05 DIAGNOSIS — I252 Old myocardial infarction: Secondary | ICD-10-CM

## 2016-12-05 DIAGNOSIS — N186 End stage renal disease: Secondary | ICD-10-CM

## 2016-12-05 DIAGNOSIS — N2581 Secondary hyperparathyroidism of renal origin: Secondary | ICD-10-CM | POA: Insufficient documentation

## 2016-12-05 DIAGNOSIS — I482 Chronic atrial fibrillation: Secondary | ICD-10-CM | POA: Diagnosis present

## 2016-12-05 DIAGNOSIS — I129 Hypertensive chronic kidney disease with stage 1 through stage 4 chronic kidney disease, or unspecified chronic kidney disease: Secondary | ICD-10-CM | POA: Insufficient documentation

## 2016-12-05 DIAGNOSIS — K652 Spontaneous bacterial peritonitis: Secondary | ICD-10-CM | POA: Diagnosis present

## 2016-12-05 DIAGNOSIS — R188 Other ascites: Secondary | ICD-10-CM | POA: Diagnosis present

## 2016-12-05 DIAGNOSIS — R1084 Generalized abdominal pain: Secondary | ICD-10-CM

## 2016-12-05 DIAGNOSIS — J90 Pleural effusion, not elsewhere classified: Secondary | ICD-10-CM

## 2016-12-05 DIAGNOSIS — D631 Anemia in chronic kidney disease: Secondary | ICD-10-CM | POA: Diagnosis not present

## 2016-12-05 DIAGNOSIS — T82868A Thrombosis of vascular prosthetic devices, implants and grafts, initial encounter: Secondary | ICD-10-CM | POA: Insufficient documentation

## 2016-12-05 DIAGNOSIS — E1142 Type 2 diabetes mellitus with diabetic polyneuropathy: Secondary | ICD-10-CM | POA: Insufficient documentation

## 2016-12-05 DIAGNOSIS — Z800 Family history of malignant neoplasm of digestive organs: Secondary | ICD-10-CM | POA: Insufficient documentation

## 2016-12-05 DIAGNOSIS — Z7982 Long term (current) use of aspirin: Secondary | ICD-10-CM | POA: Diagnosis not present

## 2016-12-05 DIAGNOSIS — G43909 Migraine, unspecified, not intractable, without status migrainosus: Secondary | ICD-10-CM | POA: Diagnosis not present

## 2016-12-05 DIAGNOSIS — Z7951 Long term (current) use of inhaled steroids: Secondary | ICD-10-CM | POA: Diagnosis not present

## 2016-12-05 HISTORY — PX: DIALYSIS/PERMA CATHETER REMOVAL: CATH118289

## 2016-12-05 LAB — URINALYSIS, COMPLETE (UACMP) WITH MICROSCOPIC
BACTERIA UA: NONE SEEN
BILIRUBIN URINE: NEGATIVE
Glucose, UA: NEGATIVE mg/dL
KETONES UR: NEGATIVE mg/dL
LEUKOCYTES UA: NEGATIVE
NITRITE: NEGATIVE
PROTEIN: NEGATIVE mg/dL
SPECIFIC GRAVITY, URINE: 1.009 (ref 1.005–1.030)
Squamous Epithelial / LPF: NONE SEEN
pH: 5 (ref 5.0–8.0)

## 2016-12-05 LAB — COMPREHENSIVE METABOLIC PANEL
ALT: 9 U/L — ABNORMAL LOW (ref 17–63)
ANION GAP: 9 (ref 5–15)
AST: 13 U/L — ABNORMAL LOW (ref 15–41)
Albumin: 2.1 g/dL — ABNORMAL LOW (ref 3.5–5.0)
Alkaline Phosphatase: 78 U/L (ref 38–126)
BILIRUBIN TOTAL: 1.3 mg/dL — AB (ref 0.3–1.2)
BUN: 84 mg/dL — ABNORMAL HIGH (ref 6–20)
CHLORIDE: 103 mmol/L (ref 101–111)
CO2: 26 mmol/L (ref 22–32)
Calcium: 8 mg/dL — ABNORMAL LOW (ref 8.9–10.3)
Creatinine, Ser: 3.36 mg/dL — ABNORMAL HIGH (ref 0.61–1.24)
GFR calc Af Amer: 24 mL/min — ABNORMAL LOW (ref >60)
GFR, EST NON AFRICAN AMERICAN: 20 mL/min — AB (ref >60)
Glucose, Bld: 134 mg/dL — ABNORMAL HIGH (ref 65–99)
POTASSIUM: 3.8 mmol/L (ref 3.5–5.1)
Sodium: 138 mmol/L (ref 135–145)
TOTAL PROTEIN: 6.2 g/dL — AB (ref 6.5–8.1)

## 2016-12-05 LAB — CBC
HEMATOCRIT: 26.5 % — AB (ref 40.0–52.0)
Hemoglobin: 8.4 g/dL — ABNORMAL LOW (ref 13.0–18.0)
MCH: 25.7 pg — ABNORMAL LOW (ref 26.0–34.0)
MCHC: 31.7 g/dL — ABNORMAL LOW (ref 32.0–36.0)
MCV: 81.1 fL (ref 80.0–100.0)
Platelets: 266 10*3/uL (ref 150–440)
RBC: 3.27 MIL/uL — AB (ref 4.40–5.90)
RDW: 17.7 % — ABNORMAL HIGH (ref 11.5–14.5)
WBC: 8 10*3/uL (ref 3.8–10.6)

## 2016-12-05 LAB — GLUCOSE, CAPILLARY: GLUCOSE-CAPILLARY: 116 mg/dL — AB (ref 65–99)

## 2016-12-05 LAB — TROPONIN I: TROPONIN I: 0.03 ng/mL — AB (ref <0.03)

## 2016-12-05 LAB — LIPASE, BLOOD: LIPASE: 19 U/L (ref 11–51)

## 2016-12-05 SURGERY — DIALYSIS/PERMA CATHETER REMOVAL
Anesthesia: Moderate Sedation

## 2016-12-05 MED ORDER — LIDOCAINE-EPINEPHRINE (PF) 2 %-1:200000 IJ SOLN
INTRAMUSCULAR | Status: AC
  Filled 2016-12-05: qty 20

## 2016-12-05 MED ORDER — NEPRO/CARBSTEADY PO LIQD
237.0000 mL | Freq: Two times a day (BID) | ORAL | Status: DC
  Administered 2016-12-06: 237 mL via ORAL

## 2016-12-05 MED ORDER — ASPIRIN EC 81 MG PO TBEC
81.0000 mg | DELAYED_RELEASE_TABLET | Freq: Every day | ORAL | Status: DC
  Administered 2016-12-06 – 2016-12-08 (×3): 81 mg via ORAL
  Filled 2016-12-05 (×4): qty 1

## 2016-12-05 MED ORDER — ONDANSETRON 4 MG PO TBDP: 4.0000 mg | ORAL_TABLET | Freq: Three times a day (TID) | ORAL | Status: DC | PRN

## 2016-12-05 MED ORDER — CARVEDILOL 6.25 MG PO TABS
3.1250 mg | ORAL_TABLET | Freq: Two times a day (BID) | ORAL | Status: DC
  Administered 2016-12-06 – 2016-12-08 (×5): 3.125 mg via ORAL
  Filled 2016-12-05 (×5): qty 1

## 2016-12-05 MED ORDER — PANTOPRAZOLE SODIUM 40 MG PO TBEC
40.0000 mg | DELAYED_RELEASE_TABLET | Freq: Every day | ORAL | Status: DC
  Administered 2016-12-06 – 2016-12-08 (×3): 40 mg via ORAL
  Filled 2016-12-05 (×4): qty 1

## 2016-12-05 MED ORDER — ZOLPIDEM TARTRATE 5 MG PO TABS
5.0000 mg | ORAL_TABLET | Freq: Every evening | ORAL | Status: DC | PRN
  Filled 2016-12-05: qty 1

## 2016-12-05 MED ORDER — INSULIN ASPART 100 UNIT/ML ~~LOC~~ SOLN
0.0000 [IU] | Freq: Three times a day (TID) | SUBCUTANEOUS | Status: DC
Start: 2016-12-06 — End: 2016-12-08
  Administered 2016-12-07 (×2): 1 [IU] via SUBCUTANEOUS
  Administered 2016-12-08: 2 [IU] via SUBCUTANEOUS
  Filled 2016-12-05: qty 1
  Filled 2016-12-05: qty 2
  Filled 2016-12-05: qty 1

## 2016-12-05 MED ORDER — FLUTICASONE FUROATE-VILANTEROL 100-25 MCG/INH IN AEPB
1.0000 | INHALATION_SPRAY | Freq: Every day | RESPIRATORY_TRACT | Status: DC
  Administered 2016-12-06 – 2016-12-08 (×3): 1 via RESPIRATORY_TRACT
  Filled 2016-12-05: qty 28

## 2016-12-05 MED ORDER — DEXTROSE 5 % IV SOLN
2.0000 g | Freq: Once | INTRAVENOUS | Status: AC
  Administered 2016-12-05: 2 g via INTRAVENOUS
  Filled 2016-12-05: qty 2

## 2016-12-05 MED ORDER — DEXTROSE 5 % IV SOLN: 2.0000 g | Freq: Once | INTRAVENOUS | Status: DC

## 2016-12-05 MED ORDER — AZITHROMYCIN 500 MG IV SOLR
500.0000 mg | Freq: Once | INTRAVENOUS | Status: AC
  Administered 2016-12-05: 500 mg via INTRAVENOUS
  Filled 2016-12-05: qty 500

## 2016-12-05 MED ORDER — HEPARIN SODIUM (PORCINE) 5000 UNIT/ML IJ SOLN
5000.0000 [IU] | Freq: Three times a day (TID) | INTRAMUSCULAR | Status: DC
  Administered 2016-12-05 – 2016-12-08 (×7): 5000 [IU] via SUBCUTANEOUS
  Filled 2016-12-05 (×6): qty 1

## 2016-12-05 MED ORDER — DEXTROSE 5 % IV SOLN: 1.0000 g | INTRAVENOUS | Status: DC

## 2016-12-05 MED ORDER — DOCUSATE SODIUM 100 MG PO CAPS: 100.0000 mg | ORAL_CAPSULE | Freq: Two times a day (BID) | ORAL | Status: DC | PRN

## 2016-12-05 MED ORDER — HYDROCODONE-ACETAMINOPHEN 7.5-325 MG PO TABS
1.0000 | ORAL_TABLET | Freq: Four times a day (QID) | ORAL | Status: DC | PRN
  Administered 2016-12-05 – 2016-12-07 (×5): 1 via ORAL
  Filled 2016-12-05 (×5): qty 1

## 2016-12-05 MED ORDER — CEFTRIAXONE SODIUM-DEXTROSE 1-3.74 GM-% IV SOLR
1.0000 g | INTRAVENOUS | Status: DC
  Filled 2016-12-05: qty 50

## 2016-12-05 MED ORDER — FUROSEMIDE 80 MG PO TABS
80.0000 mg | ORAL_TABLET | Freq: Two times a day (BID) | ORAL | Status: DC
  Administered 2016-12-05 – 2016-12-06 (×2): 80 mg via ORAL
  Filled 2016-12-05 (×2): qty 1

## 2016-12-05 MED ORDER — INSULIN GLARGINE 100 UNIT/ML ~~LOC~~ SOLN
18.0000 [IU] | Freq: Every morning | SUBCUTANEOUS | Status: DC
  Administered 2016-12-06: 18 [IU] via SUBCUTANEOUS
  Filled 2016-12-05 (×2): qty 0.18

## 2016-12-05 MED ORDER — ALBUMIN HUMAN 5 % IV SOLN
12.5000 g | Freq: Once | INTRAVENOUS | Status: AC
  Administered 2016-12-06: 12.5 g via INTRAVENOUS
  Filled 2016-12-05: qty 250

## 2016-12-05 MED ORDER — INSULIN ASPART 100 UNIT/ML ~~LOC~~ SOLN
4.0000 [IU] | Freq: Three times a day (TID) | SUBCUTANEOUS | Status: DC
  Administered 2016-12-06: 4 [IU] via SUBCUTANEOUS
  Filled 2016-12-05: qty 4

## 2016-12-05 MED ORDER — AMIODARONE HCL 200 MG PO TABS
200.0000 mg | ORAL_TABLET | Freq: Every day | ORAL | Status: DC
  Administered 2016-12-06 – 2016-12-08 (×3): 200 mg via ORAL
  Filled 2016-12-05 (×4): qty 1

## 2016-12-05 MED ORDER — METHIMAZOLE 10 MG PO TABS
10.0000 mg | ORAL_TABLET | Freq: Every day | ORAL | Status: DC
  Administered 2016-12-06 – 2016-12-08 (×3): 10 mg via ORAL
  Filled 2016-12-05 (×4): qty 1

## 2016-12-05 MED ORDER — BISACODYL 10 MG RE SUPP: 10.0000 mg | Freq: Every day | RECTAL | Status: DC | PRN

## 2016-12-05 SURGICAL SUPPLY — 2 items
TRAY LACERAT/PLASTIC (MISCELLANEOUS) ×2 IMPLANT
TRAY SUT REMOVAL LITTAUER SCS (KITS) ×2 IMPLANT

## 2016-12-05 NOTE — ED Notes (Signed)
primedoc with pt for admission  

## 2016-12-05 NOTE — H&P (Signed)
[employer] [facility] [place] [facility]   PATIENT NAME: Juan Roberson    MR#:  [id number]  DATE OF BIRTH:  11/20/1970  DATE OF ADMISSION:  12/05/2016  PRIMARY CARE PHYSICIAN: Corky DownsMASOUD,JAVED, MD   REQUESTING/REFERRING PHYSICIAN: Scotty CourtStafford  CHIEF COMPLAINT:   Chief Complaint  Patient presents with  . Shortness of Breath    HISTORY OF PRESENT ILLNESS: Juan Roberson  is a 46 y.o. male with a known history of RSV infection of the right knee, MRSA bacteremia, chronic kidney disease and required hemodialysis, CHF ejection fraction 20%, essential hypertension, gastritis, left leg DVT, myocardial infarction, osteomyelitis- was admitted in hospital for long stay in January 2018 and was discharged home with hemodialysis and vancomycin with dialysis to treat his bacteremia and infection. 6 weeks IV antibiotic therapy with his hemodialysis and nephrologist decided he may not need any hemodialysis so he is not receiving any dialysis for last 2-3 weeks now. He started having swelling on his legs so nephrologist advice to have elastic stockings. But now his abdomen started to swell an is painful. Nephrologist sent to remove his dialysis catheter from his right side upper chest and ordered a PICC line on his left side IJ as outpatient as they were planning to continue antibiotic for little longer, as per the patient. After having this procedure patient decided to come to emergency room as his abdomen is swollen and he is having pain due to that. He denies any fever or chills. His right knee is completely healed now and he can walk without any trouble now.  PAST MEDICAL HISTORY:   Past Medical History:  Diagnosis Date  . Cellulitis and abscess of foot    Left-Dr. Ether GriffinsFowler  . CHF (congestive heart failure) (HCC)    EF 20%  . CKD (chronic kidney disease), stage III   . Diabetes mellitus without complication (HCC)    a. Dx ~ 1996.  [name]. Dysrhythmia   . Essential hypertension   . Gastritis    a. 04/2015  hematemesis -> EGD: gastritis, esophagitis, duodenitis.  No active bleeding.  PPI added.  [name]. GERD (gastroesophageal reflux disease)   . Left leg DVT (HCC)    a. Dx 05/2015 -> Coumadin.  . MI (myocardial infarction)   . Osteomyelitis (HCC)    a. 05/2015 L foot.    PAST SURGICAL HISTORY: Past Surgical History:  Procedure Laterality Date  . CARDIAC CATHETERIZATION Right 10/26/2016   Procedure: Left Heart Cath and Coronary Angiography;  Surgeon: Laurier NancyShaukat A Khan, MD;  Location: [facility] INVASIVE CV LAB;  Service: Cardiovascular;  Laterality: Right;  . ESOPHAGOGASTRODUODENOSCOPY N/A 04/07/2015   Procedure: ESOPHAGOGASTRODUODENOSCOPY (EGD);  Surgeon: Midge Miniumarren Wohl, MD;  [facility];  Service: Endoscopy;  Laterality: N/A;  . ESOPHAGOGASTRODUODENOSCOPY (EGD) WITH PROPOFOL Left 06/30/2015   Procedure: ESOPHAGOGASTRODUODENOSCOPY (EGD) WITH PROPOFOL;  Surgeon: Christena DeemMartin U Skulskie, MD;  [facility];  Service: Endoscopy;  Laterality: Left;  . FOOT SURGERY    . I&D EXTREMITY Left 04/06/2015   Procedure: IRRIGATION AND DEBRIDEMENT EXTREMITY;  Surgeon: Gwyneth RevelsJustin Fowler, DPM;  Location: [facility] ORS;  Service: Podiatry;  Laterality: Left;  . IRRIGATION AND DEBRIDEMENT FOOT Left 05/21/2015   Procedure: IRRIGATION AND DEBRIDEMENT FOOT;  Surgeon: Linus Galasodd Cline, MD;  Location: [facility] ORS;  Service: Podiatry;  Laterality: Left;  . IRRIGATION AND DEBRIDEMENT KNEE Right 10/18/2016   Procedure: IRRIGATION AND DEBRIDEMENT KNEE;  Surgeon: Deeann SaintHoward Miller, MD;  Location: [facility] ORS;  Service: Orthopedics;  Laterality: Right;  . KNEE ARTHROSCOPY  10/18/2016  Procedure: ARTHROSCOPY KNEE;  Surgeon: Deeann Saint, MD;  Location: [facility] ORS;  Service: Orthopedics;;  . PERIPHERAL VASCULAR CATHETERIZATION N/A 10/27/2016   Procedure: Dialysis/Perma Catheter Insertion;  Surgeon: Annice Needy, MD;  Location: [facility] INVASIVE CV LAB;  Service: Cardiovascular;  Laterality: N/A;    SOCIAL HISTORY:  Social History  Substance Use Topics  .  Smoking status: Never Smoker  . Smokeless tobacco: Never Used  . Alcohol use No    FAMILY HISTORY:  Family History  Problem Relation Age of Onset  . Coronary artery disease Father   . Pancreatic cancer Mother   . Breast cancer Sister   . Lung cancer Brother   . Cervical cancer Sister     DRUG ALLERGIES:  Allergies  Allergen Reactions  . Sulfur Other (See Comments)    Pt states that this medication causes renal failure.      REVIEW OF SYSTEMS:   CONSTITUTIONAL: No fever, fatigue or weakness.  EYES: No blurred or double vision.  EARS, NOSE, AND THROAT: No tinnitus or ear pain.  RESPIRATORY: No cough, shortness of breath, wheezing or hemoptysis.  CARDIOVASCULAR: No chest pain, orthopnea, edema.  GASTROINTESTINAL: No nausea, vomiting, diarrhea , patient have distention and abdominal pain.  GENITOURINARY: No dysuria, hematuria.  ENDOCRINE: No polyuria, nocturia,  HEMATOLOGY: No anemia, easy bruising or bleeding SKIN: No rash or lesion. MUSCULOSKELETAL: No joint pain or arthritis.   NEUROLOGIC: No tingling, numbness, weakness.  PSYCHIATRY: No anxiety or depression.   MEDICATIONS AT HOME:  Prior to Admission medications   Medication Sig Start Date End Date Taking? Authorizing Provider  amiodarone (PACERONE) 200 MG tablet Take 1 tablet (200 mg total) by mouth daily. 09/25/16  Yes Shaune Pollack, MD  aspirin EC 81 MG EC tablet Take 1 tablet (81 mg total) by mouth daily. 07/23/16  Yes Richard Renae Gloss, MD  BREO ELLIPTA 100-25 MCG/INH AEPB Inhale 1 puff into the lungs daily.  11/09/16  Yes Historical Provider, MD  carvedilol (COREG) 3.125 MG tablet Take 1 tablet (3.125 mg total) by mouth 2 (two) times daily with a meal. 10/27/16  Yes Altamese Dilling, MD  furosemide (LASIX) 80 MG tablet Take 1 tablet (80 mg total) by mouth 2 (two) times daily. 10/31/16  Yes Enid Baas, MD  insulin aspart (NOVOLOG) 100 UNIT/ML injection Inject 4 Units into the skin 3 (three) times daily with  meals. 10/31/16  Yes Enid Baas, MD  Insulin Glargine (TOUJEO SOLOSTAR) 300 UNIT/ML SOPN Inject 18 Units into the skin every morning. Patient taking differently: Inject 25 Units into the skin every morning.  10/31/16  Yes Enid Baas, MD  methimazole (TAPAZOLE) 10 MG tablet Take 1 tablet (10 mg total) by mouth daily. 07/22/16  Yes Richard Renae Gloss, MD  pantoprazole (PROTONIX) 40 MG tablet Take 1 tablet (40 mg total) by mouth daily. 09/25/16  Yes Shaune Pollack, MD  zolpidem (AMBIEN) 5 MG tablet Take 5 mg by mouth at bedtime as needed for sleep.  11/14/16  Yes Historical Provider, MD  alum & mag hydroxide-simeth (MAALOX/MYLANTA) 200-200-20 MG/5ML suspension Take 30 mLs by mouth every 6 (six) hours as needed for indigestion or heartburn. Patient not taking: Reported on 11/29/2016 09/25/16   Shaune Pollack, MD  bisacodyl (DULCOLAX) 10 MG suppository Place 1 suppository (10 mg total) rectally daily as needed for moderate constipation. Patient not taking: Reported on 11/29/2016 10/27/16   Altamese Dilling, MD  HYDROcodone-acetaminophen (NORCO) 7.5-325 MG tablet Take 1 tablet by mouth every 6 (six) hours as needed for moderate  pain (breakthrough pain). Patient not taking: Reported on 11/29/2016 10/31/16   Enid Baas, MD  Nutritional Supplements (FEEDING SUPPLEMENT, NEPRO CARB STEADY,) LIQD Take 237 mLs by mouth 2 (two) times daily between meals. Patient not taking: Reported on 11/29/2016 10/27/16   Altamese Dilling, MD  ondansetron (ZOFRAN ODT) 4 MG disintegrating tablet Take 1 tablet (4 mg total) by mouth every 8 (eight) hours as needed for nausea or vomiting. Patient not taking: Reported on 11/29/2016 10/15/16   Emily Filbert, MD      PHYSICAL EXAMINATION:   VITAL SIGNS: Blood pressure 121/82, pulse 83, temperature 98.1 F (36.7 C), temperature source Oral, resp. rate 20, height 6\' 2"  (1.88 m), weight 96.2 kg (212 lb), SpO2 99 %.  GENERAL:  46 y.o.-year-old patient lying in the bed  with no acute distress.  EYES: Pupils equal, round, reactive to light and accommodation. No scleral icterus. Extraocular muscles intact.  HEENT: Head atraumatic, normocephalic. Oropharynx and nasopharynx clear.  NECK:  Supple, no jugular venous distention. No thyroid enlargement, no tenderness.  LUNGS: Normal breath sounds bilaterally, no wheezing, rales,rhonchi or crepitation. No use of accessory muscles of respiration.  CARDIOVASCULAR: S1, S2 normal. No murmurs, rubs, or gallops.  ABDOMEN: Soft, tender, distended. Bowel sounds present. No organomegaly or mass.  EXTREMITIES: No pedal edema, cyanosis, or clubbing.  NEUROLOGIC: Cranial nerves II through XII are intact. Muscle strength 5/5 in all extremities. Sensation intact. Gait not checked.  PSYCHIATRIC: The patient is alert and oriented x 3.  SKIN: No obvious rash, lesion, or ulcer.   LABORATORY PANEL:   CBC  Recent Labs Lab 12/05/16 1431  WBC 8.0  HGB 8.4*  HCT 26.5*  PLT 266  MCV 81.1  MCH 25.7*  MCHC 31.7*  RDW 17.7*   ------------------------------------------------------------------------------------------------------------------  Chemistries   Recent Labs Lab 12/05/16 1431  NA 138  K 3.8  CL 103  CO2 26  GLUCOSE 134*  BUN 84*  CREATININE 3.36*  CALCIUM 8.0*  AST 13*  ALT 9*  ALKPHOS 78  BILITOT 1.3*   ------------------------------------------------------------------------------------------------------------------ estimated creatinine clearance is 31.6 mL/min (by C-G formula based on SCr of 3.36 mg/dL (H)). ------------------------------------------------------------------------------------------------------------------ No results for input(s): TSH, T4TOTAL, T3FREE, THYROIDAB in the last 72 hours.  Invalid input(s): FREET3   Coagulation profile No results for input(s): INR, PROTIME in the last 168  hours. ------------------------------------------------------------------------------------------------------------------- No results for input(s): DDIMER in the last 72 hours. -------------------------------------------------------------------------------------------------------------------  Cardiac Enzymes  Recent Labs Lab 12/05/16 1431  TROPONINI 0.03*   ------------------------------------------------------------------------------------------------------------------ Invalid input(s): POCBNP  ---------------------------------------------------------------------------------------------------------------  Urinalysis    Component Value Date/Time   COLORURINE YELLOW (A) 12/05/2016 1431   APPEARANCEUR CLEAR (A) 12/05/2016 1431   APPEARANCEUR Clear 07/26/2014 1920   LABSPEC 1.009 12/05/2016 1431   LABSPEC 1.012 07/26/2014 1920   PHURINE 5.0 12/05/2016 1431   GLUCOSEU NEGATIVE 12/05/2016 1431   GLUCOSEU Negative 07/26/2014 1920   HGBUR SMALL (A) 12/05/2016 1431   BILIRUBINUR NEGATIVE 12/05/2016 1431   BILIRUBINUR Negative 07/26/2014 1920   KETONESUR NEGATIVE 12/05/2016 1431   PROTEINUR NEGATIVE 12/05/2016 1431   NITRITE NEGATIVE 12/05/2016 1431   LEUKOCYTESUR NEGATIVE 12/05/2016 1431   LEUKOCYTESUR Negative 07/26/2014 1920     RADIOLOGY: Dg Chest 2 View  Result Date: 12/05/2016 CLINICAL DATA:  Productive cough and shortness of Breath EXAM: CHEST  2 VIEW COMPARISON:  12/05/2016 FINDINGS: Cardiac shadow is enlarged. The previously seen dialysis catheter has been removed. Left jugular catheter remains in the SVC. Right-sided pleural effusion is noted similar to that  seen on the recent exam. No new focal infiltrate or sizable effusion is seen. IMPRESSION: Stable right basilar changes.  No new focal abnormality is seen. Electronically Signed   By: Alcide Clever M.D.   On: 12/05/2016 15:32   Dg Chest Port 1 View  Result Date: 12/05/2016 CLINICAL DATA:  Central line placement EXAM:  PORTABLE CHEST 1 VIEW COMPARISON:  10/28/2016 FINDINGS: 1157 hours. Low volumes. The cardio pericardial silhouette is enlarged. Right IJ dialysis catheter remains in place with tip overlying the upper right atrium. Left IJ central line is new in the interval with tip overlying the distal SVC. No evidence for left pneumothorax. The right base collapse/ consolidation has progressed in the interval with small right pleural effusion. IMPRESSION: Left IJ central line tip overlies the distal SVC. No left pneumothorax. New right base collapse/ consolidation with effusion. Electronically Signed   By: Kennith Center M.D.   On: 12/05/2016 12:26    EKG: Orders placed or performed during the hospital encounter of 12/05/16  . ED EKG  . ED EKG    IMPRESSION AND PLAN:  * Subacute bacterial peritonitis   We'll give IV ceftriaxone and try to get her ultrasound-guided paracentesis.   He may also need albumin infusion after the tap.  * Chronic kidney disease   Nephrology to decide about need for dialysis.   Patient was also telling that nephrologist were planning to do antibiotic for little longer time, I could not find any notes regarding that but I will call the consult and let them decide about this issue.  * Diabetes   Continue home medications. Keep on insulin sliding scale coverage.  * A. Fib   Continue amiodarone.  * COPD   Currently no exacerbation symptoms, continue his inhalers.  * Anemia of chronic disease    continue monitoring.  All the records are reviewed and case discussed with ED provider. Management plans discussed with the patient, family and they are in agreement.  CODE STATUS: full code  Code Status History    Date Active Date Inactive Code Status Order ID Comments User Context   10/28/2016  8:42 PM 10/31/2016  8:35 PM Full Code [id number]  Houston Siren, MD Inpatient   10/17/2016  8:18 PM 10/27/2016  6:48 PM Full Code [id number]  Auburn Bilberry, MD Inpatient   09/24/2016  3:36 PM  09/25/2016  2:15 PM Full Code [id number]  Wyatt Haste, MD ED   07/21/2016 11:05 AM 07/22/2016  3:11 PM Full Code [id number]  Alford Highland, MD ED   02/28/2016  1:43 AM 03/01/2016  6:06 PM Full Code [id number]  Arnaldo Natal, MD Inpatient   12/06/2015  8:39 PM 12/08/2015  2:51 PM Full Code [id number]  Altamese Dilling, MD Inpatient   11/01/2015  4:00 PM 11/03/2015  2:28 PM Full Code [id number]  Wyatt Haste, MD ED   06/29/2015  2:30 AM 07/01/2015  2:21 PM Full Code [id number]  Arnaldo Natal, MD Inpatient   05/21/2015  4:34 PM 05/25/2015  4:34 PM Full Code [id number]  Linus Galas, MD Inpatient   05/18/2015  9:17 PM 05/21/2015  4:34 PM Full Code [id number]  Shaune Pollack, MD Inpatient   04/05/2015  7:05 AM 04/08/2015  2:00 PM Full Code [id number]  Arnaldo Natal, MD Inpatient       TOTAL TIME TAKING CARE OF THIS PATIENT: 14es.    Altamese Dilling M.D on 12/05/2016   Between 7am to 6pm - Pager - [phone]  After 6pm  go to [email or web address] - password [email or web address]  [facility]  Office  [phone]  CC: Primary care physician; Corky Downs, MD   Note: This dictation was prepared with Dragon dictation along with smaller phrase technology. Any transcriptional errors that result from this process are unintentional.

## 2016-12-05 NOTE — ED Provider Notes (Signed)
[facility] Emergency Department Provider Note  ____________________________________________  Time seen: Approximately 5:36 PM  I have reviewed the triage vital signs and the nursing notes.   HISTORY  Chief Complaint Shortness of Breath    HPI Juan Roberson. is a 46 y.o. male who complains of generalized abdominal painand swelling for the past 2 days. Also has shortness of breath. More short of breath when he lies down flat. Pain to move around or balance or hit speed bumps in the car. Pain is moderate intensity, nonradiating. No alleviating factors. Denies fever chills or sweats or chest pain. Positive cough     Past Medical History:  Diagnosis Date  . Cellulitis and abscess of foot    Left-Dr. Ether Griffins  . CHF (congestive heart failure) (HCC)    EF 20%  . CKD (chronic kidney disease), stage III   . Diabetes mellitus without complication (HCC)    a. Dx ~ 1996.  [name] Dysrhythmia   . Essential hypertension   . Gastritis    a. 04/2015 hematemesis -> EGD: gastritis, esophagitis, duodenitis.  No active bleeding.  PPI added.  [name] GERD (gastroesophageal reflux disease)   . Left leg DVT (HCC)    a. Dx 05/2015 -> Coumadin.  . MI (myocardial infarction)   . Osteomyelitis (HCC)    a. 05/2015 L foot.     Patient Active Problem List   Diagnosis Date Noted  . Hyperglycemia 10/28/2016  . Sepsis (HCC) 10/17/2016  . Chest pain 09/24/2016  . Acute-on-chronic kidney injury (HCC) 09/24/2016     Past Surgical History:  Procedure Laterality Date  . CARDIAC CATHETERIZATION Right 10/26/2016   Procedure: Left Heart Cath and Coronary Angiography;  Surgeon: Laurier Nancy, MD;  Location: [facility] INVASIVE CV LAB;  Service: Cardiovascular;  Laterality: Right;  . ESOPHAGOGASTRODUODENOSCOPY N/A 04/07/2015   Procedure: ESOPHAGOGASTRODUODENOSCOPY (EGD);  Surgeon: Midge Minium, MD;  Location: [facility] ENDOSCOPY;  Service: Endoscopy;  Laterality: N/A;  . ESOPHAGOGASTRODUODENOSCOPY  (EGD) WITH PROPOFOL Left 06/30/2015   Procedure: ESOPHAGOGASTRODUODENOSCOPY (EGD) WITH PROPOFOL;  Surgeon: Christena Deem, MD;  Location: [facility] END[facility]COPY;  Service: Endoscopy;  Laterality: Left;  . FOOT SURGERY    . I&D EXTREMITY Left 04/06/2015   Procedure: IRRIGATION AND DEBRIDEMENT EXTREMITY;  Surgeon: Gwyneth Revels, DPM;  Location: [facility] ORS;  Service: Podiatry;  Laterality: Left;  . IRRIGATION AND DEBRIDEMENT FOOT Left 05/21/2015   Procedure: IRRIGATION AND DEBRIDEMENT FOOT;  Surgeon: Linus Galas, MD;  Location: [facility] ORS;  Service: Podiatry;  Laterality: Left;  . IRRIGATION AND DEBRIDEMENT KNEE Right 10/18/2016   Procedure: IRRIGATION AND DEBRIDEMENT KNEE;  Surgeon: Deeann Saint, MD;  Location: [facility] ORS;  Service: Orthopedics;  Laterality: Right;  . KNEE ARTHROSCOPY  10/18/2016   Procedure: ARTHROSCOPY KNEE;  Surgeon: Deeann Saint, MD;  Location: [facility] ORS;  Service: Orthopedics;;  . PERIPHERAL VASCULAR CATHETERIZATION N/A 10/27/2016   Procedure: Dialysis/Perma Catheter Insertion;  Surgeon: Annice Needy, MD;  Location: [facility] INVASIVE CV LAB;  Service: Cardiovascular;  Laterality: N/A;     Prior to Admission medications   Medication Sig Start Date End Date Taking? Authorizing Provider  amiodarone (PACERONE) 200 MG tablet Take 1 tablet (200 mg total) by mouth daily. 09/25/16  Yes Shaune Pollack, MD  aspirin EC 81 MG EC tablet Take 1 tablet (81 mg total) by mouth daily. 07/23/16  Yes Richard Renae Gloss, MD  BREO ELLIPTA 100-25 MCG/INH AEPB Inhale 1 puff into the lungs daily.  11/09/16  Yes Historical Provider, MD  carvedilol (COREG)  3.125 MG tablet Take 1 tablet (3.125 mg total) by mouth 2 (two) times daily with a meal. 10/27/16  Yes Altamese DillingVaibhavkumar Vachhani, MD  furosemide (LASIX) 80 MG tablet Take 1 tablet (80 mg total) by mouth 2 (two) times daily. 10/31/16  Yes Enid Baasadhika Kalisetti, MD  insulin aspart (NOVOLOG) 100 UNIT/ML injection Inject 4 Units into the skin 3 (three) times daily with meals. 10/31/16  Yes  Enid Baasadhika Kalisetti, MD  Insulin Glargine (TOUJEO SOLOSTAR) 300 UNIT/ML SOPN Inject 18 Units into the skin every morning. Patient taking differently: Inject 25 Units into the skin every morning.  10/31/16  Yes Enid Baasadhika Kalisetti, MD  methimazole (TAPAZOLE) 10 MG tablet Take 1 tablet (10 mg total) by mouth daily. 07/22/16  Yes Richard Renae GlossWieting, MD  pantoprazole (PROTONIX) 40 MG tablet Take 1 tablet (40 mg total) by mouth daily. 09/25/16  Yes Shaune PollackQing Chen, MD  zolpidem (AMBIEN) 5 MG tablet Take 5 mg by mouth at bedtime as needed for sleep.  11/14/16  Yes Historical Provider, MD  alum & mag hydroxide-simeth (MAALOX/MYLANTA) 200-200-20 MG/5ML suspension Take 30 mLs by mouth every 6 (six) hours as needed for indigestion or heartburn. Patient not taking: Reported on 11/29/2016 09/25/16   Shaune PollackQing Chen, MD  bisacodyl (DULCOLAX) 10 MG suppository Place 1 suppository (10 mg total) rectally daily as needed for moderate constipation. Patient not taking: Reported on 11/29/2016 10/27/16   Altamese DillingVaibhavkumar Vachhani, MD  HYDROcodone-acetaminophen (NORCO) 7.5-325 MG tablet Take 1 tablet by mouth every 6 (six) hours as needed for moderate pain (breakthrough pain). Patient not taking: Reported on 11/29/2016 10/31/16   Enid Baasadhika Kalisetti, MD  Nutritional Supplements (FEEDING SUPPLEMENT, NEPRO CARB STEADY,) LIQD Take 237 mLs by mouth 2 (two) times daily between meals. Patient not taking: Reported on 11/29/2016 10/27/16   Altamese DillingVaibhavkumar Vachhani, MD  ondansetron (ZOFRAN ODT) 4 MG disintegrating tablet Take 1 tablet (4 mg total) by mouth every 8 (eight) hours as needed for nausea or vomiting. Patient not taking: Reported on 11/29/2016 10/15/16   Emily FilbertJonathan E Williams, MD     Allergies Sulfur   Family History  Problem Relation Age of Onset  . Coronary artery disease Father   . Pancreatic cancer Mother   . Breast cancer Sister   . Lung cancer Brother   . Cervical cancer Sister     Social History Social History  Substance Use Topics  .  Smoking status: Never Smoker  . Smokeless tobacco: Never Used  . Alcohol use No    Review of Systems  Constitutional:   No fever or chills.  ENT:   No sore throat. No rhinorrhea. Cardiovascular:   No chest pain. Respiratory:   Positive shortness of breath and cough. Gastrointestinal:   Positive generalized abdominal pain without vomiting and diarrhea.  Genitourinary:   Negative for dysuria or difficulty urinating. Musculoskeletal:   Osteoarthritis of the left foot, on vancomycin through a PICC line for this. Reports he hasn't had vancomycin since February 10. Neurological:   Negative for headaches 10-point ROS otherwise negative.  ____________________________________________   PHYSICAL EXAM:  VITAL SIGNS: ED Triage Vitals  Enc Vitals Group     BP 12/05/16 1421 130/81     Pulse Rate 12/05/16 1421 83     Resp 12/05/16 1421 18     Temp 12/05/16 1421 98.1 F (36.7 C)     Temp Source 12/05/16 1421 Oral     SpO2 12/05/16 1421 97 %     Weight 12/05/16 1424 212 lb (96.2 kg)  Height 12/05/16 1424 6\' 2"  (1.88 m)     Head Circumference --      Peak Flow --      Pain Score 12/05/16 1425 8     Pain Loc --      Pain Edu? --      Excl. in GC? --     Vital signs reviewed, nursing assessments reviewed.   Constitutional:   Alert and oriented. Well appearing and in no distress. Eyes:   No scleral icterus. No conjunctival pallor. PERRL. EOMI.  No nystagmus. ENT   Head:   Normocephalic and atraumatic.   Nose:   No congestion/rhinnorhea. No septal hematoma   Mouth/Throat:   MMM, no pharyngeal erythema. No peritonsillar mass.    Neck:   No stridor. No SubQ emphysema. No meningismus. Hematological/Lymphatic/Immunilogical:   No cervical lymphadenopathy. Cardiovascular:   RRR. Symmetric bilateral radial and DP pulses.  No murmurs.  Respiratory:   Normal respiratory effort without tachypnea nor retractions. Absent breath sounds in right base. Gastrointestinal:   Soft with  generalized tenderness. Moderately distended but not rigid. There is no CVA tenderness. Positive rebound. Point-of-care ultrasound performed by me at bedside, shows diffuse ascites, not large volume. Unable to identify acceptable target space for paracentesis. Genitourinary:   deferred Musculoskeletal:   Normal range of motion in all extremities. No joint effusions.  No lower extremity tenderness.  No edema. Neurologic:   Normal speech and language.  CN 2-10 normal. Motor grossly intact. No gross focal neurologic deficits are appreciated.  Skin:    Skin is warm, dry and intact. No rash noted.  No petechiae, purpura, or bullae.  ____________________________________________    LABS (pertinent positives/negatives) (all labs ordered are listed, but only abnormal results are displayed) Labs Reviewed  COMPREHENSIVE METABOLIC PANEL - Abnormal; Notable for the following:       Result Value   Glucose, Bld 134 (*)    BUN 84 (*)    Creatinine, Ser 3.36 (*)    Calcium 8.0 (*)    Total Protein 6.2 (*)    Albumin 2.1 (*)    AST 13 (*)    ALT 9 (*)    Total Bilirubin 1.3 (*)    GFR calc non Af Amer 20 (*)    GFR calc Af Amer 24 (*)    All other components within normal limits  CBC - Abnormal; Notable for the following:    RBC 3.27 (*)    Hemoglobin 8.4 (*)    HCT 26.5 (*)    MCH 25.7 (*)    MCHC 31.7 (*)    RDW 17.7 (*)    All other components within normal limits  URINALYSIS, COMPLETE (UACMP) WITH MICROSCOPIC - Abnormal; Notable for the following:    Color, Urine YELLOW (*)    APPearance CLEAR (*)    Hgb urine dipstick SMALL (*)    All other components within normal limits  TROPONIN I - Abnormal; Notable for the following:    Troponin I 0.03 (*)    All other components within normal limits  LIPASE, BLOOD   ____________________________________________   EKG  Turbid by me Normal sinus rhythm rate of 80. Right axis. Normal intervals. Right bundle-branch block. Diffuse anterior  and inferior T wave inversions. Unchanged from 10/25/2016.  ____________________________________________    RADIOLOGY  Dg Chest 2 View  Result Date: 12/05/2016 CLINICAL DATA:  Productive cough and shortness of Breath EXAM: CHEST  2 VIEW COMPARISON:  12/05/2016 FINDINGS: Cardiac shadow is enlarged. The  previously seen dialysis catheter has been removed. Left jugular catheter remains in the SVC. Right-sided pleural effusion is noted similar to that seen on the recent exam. No new focal infiltrate or sizable effusion is seen. IMPRESSION: Stable right basilar changes.  No new focal abnormality is seen. Electronically Signed   By: Alcide Clever M.D.   On: 12/05/2016 15:32   Dg Chest Port 1 View  Result Date: 12/05/2016 CLINICAL DATA:  Central line placement EXAM: PORTABLE CHEST 1 VIEW COMPARISON:  10/28/2016 FINDINGS: 1157 hours. Low volumes. The cardio pericardial silhouette is enlarged. Right IJ dialysis catheter remains in place with tip overlying the upper right atrium. Left IJ central line is new in the interval with tip overlying the distal SVC. No evidence for left pneumothorax. The right base collapse/ consolidation has progressed in the interval with small right pleural effusion. IMPRESSION: Left IJ central line tip overlies the distal SVC. No left pneumothorax. New right base collapse/ consolidation with effusion. Electronically Signed   By: Kennith Center M.D.   On: 12/05/2016 12:26    ____________________________________________   PROCEDURES Procedures  ____________________________________________   INITIAL IMPRESSION / ASSESSMENT AND PLAN / ED COURSE  Pertinent labs & imaging results that were available during my care of the patient were reviewed by me and considered in my medical decision making (see chart for details).  Patient presents with shortness of breath generalized abdominal pain. Dilation is consistent with ascites concerning for SBP. He has an increased consolidation or  effusion on the right lower lung as well, will give ceftriaxone and azithromycin to cover both intra-abdominal infection and pulmonary infection. Case discussed with hospitalist for admission. Due to relatively small volume of free fluid on bedside ultrasound performed by me, I feel I am unable to safely perform a paracentesis at bedside in the emergency department, so the patient will be treated empirically at this time.         ____________________________________________   FINAL CLINICAL IMPRESSION(S) / ED DIAGNOSES  Final diagnoses:  Spontaneous bacterial peritonitis (HCC)  Pleural effusion on right  Shortness of breath  Generalized abdominal pain      New Prescriptions   No medications on file     Portions of this note were generated with dragon dictation software. Dictation errors may occur despite best attempts at proofreading.    Sharman Cheek, MD 12/05/16 [phone]

## 2016-12-05 NOTE — Op Note (Signed)
  OPERATIVE NOTE   PROCEDURE: 1. Removal of a right IJ tunneled dialysis catheter  PRE-OPERATIVE DIAGNOSIS: Complication of dialysis catheter  POST-OPERATIVE DIAGNOSIS: Same  SURGEON: Levora DredgeGregory Schnier  ANESTHESIA: Local anesthetic with 1% lidocaine with epinephrine   ESTIMATED BLOOD LOSS: Minimal   FINDING(S): 1. Catheter intact   SPECIMEN(S):  Catheter  INDICATIONS:   Juan BlossomWaltzie L Segar Jr. is a 46 y.o. male who presents with a right IJ catheter that is no longer needed.  His renal function has recovered and he is now no longer requiring dialysis. Therefore, the catheter is being removed. The risks and benefits were reviewed all questions answered patient agrees to proceed.  DESCRIPTION: After obtaining full informed written consent, the patient was positioned supine. The right IJ catheter and surrounding area is prepped and draped in a sterile fashion. The cuff was localized by palpation and noted to be greater than 3 cm from the exit site. After appropriate timeout is called, 1% lidocaine with epinephrine is infiltrated into the surrounding tissues around the cuff. Small transverse incision is created with an 11 blade scalpel and the dissection was carried down to expose the cuff of the tunneled catheter.  The catheter is then freed from the surrounding attachments and adhesions. Once the catheter has been freed circumferentially it is transected just distal to the cuff and subsequently removed in 2 pieces. Light pressure was held at the base of the neck. A 4-0 Monocryl was used close the tunnel in the subcutaneous space. The 4-0 Monocryl Monocryl was then used to close the skin in a subcuticular stitch. Dermabond is applied.  Antibiotic ointment and a sterile dressing is applied to the exit site. Patient tolerated procedure well and there were no complications.  COMPLICATIONS: None  CONDITION: Unchanged  Levora DredgeGregory Schnier. [facility] Office:  [phone]  12/05/2016,6:24 PM

## 2016-12-05 NOTE — ED Notes (Signed)
[facility]   Pt alert.

## 2016-12-05 NOTE — Discharge Instructions (Signed)
inc Incision Care, Adult An incision is a cut that a doctor makes in your skin for surgery (for a procedure). Most times, these cuts are closed after surgery. Your cut from surgery may be closed with stitches (sutures), staples, skin glue, or skin tape (adhesive strips). You may need to return to your doctor to have stitches or staples taken out. This may happen many days or many weeks after your surgery. The cut needs to be well cared for so it does not get infected. How to care for your cut Cut care   Follow instructions from your doctor about how to take care of your cut. Make sure you:  Wash your hands with soap and water before you change your bandage (dressing). If you cannot use soap and water, use hand sanitizer.  Change your bandage as told by your doctor.  Leave stitches, skin glue, or skin tape in place. They may need to stay in place for 2 weeks or longer. If tape strips get loose and curl up, you may trim the loose edges. Do not remove tape strips completely unless your doctor says it is okay.  Check your cut area every day for signs of infection. Check for:  More redness, swelling, or pain.  More fluid or blood.  Warmth.  Pus or a bad smell.  Ask your doctor how to clean the cut. This may include:  Using mild soap and water.  Using a clean towel to pat the cut dry after you clean it.  Putting a cream or ointment on the cut. Do this only as told by your doctor.  Covering the cut with a clean bandage.  Ask your doctor when you can leave the cut uncovered.  Do not take baths, swim, or use a hot tub until your doctor says it is okay. Ask your doctor if you can take showers. You may only be allowed to take sponge baths for bathing. Medicines   If you were prescribed an antibiotic medicine, cream, or ointment, take the antibiotic or put it on the cut as told by your doctor. Do not stop taking or putting on the antibiotic even if your condition gets better.  Take  over-the-counter and prescription medicines only as told by your doctor. General instructions   Limit movement around your cut. This helps healing.  Avoid straining, lifting, or exercise for the first month, or for as long as told by your doctor.  Follow instructions from your doctor about going back to your normal activities.  Ask your doctor what activities are safe.  Protect your cut from the sun when you are outside for the first 6 months, or for as long as told by your doctor. Put on sunscreen around the scar or cover up the scar.  Keep all follow-up visits as told by your doctor. This is important. Contact a doctor if:  Your have more redness, swelling, or pain around the cut.  You have more fluid or blood coming from the cut.  Your cut feels warm to the touch.  You have pus or a bad smell coming from the cut.  You have a fever or shaking chills.  You feel sick to your stomach (nauseous) or you throw up (vomit).  You are dizzy.  Your stitches or staples come undone. Get help right away if:  You have a red streak coming from your cut.  Your cut bleeds through the bandage and the bleeding does not stop with gentle pressure.  The edges  of your cut open up and separate.  You have very bad (severe) pain.  You have a rash.  You are confused.  You pass out (faint).  You have trouble breathing and you have a fast heartbeat. This information is not intended to replace advice given to you by your health care provider. Make sure you discuss any questions you have with your health care provider. Document Released: 12/11/2011 Document Revised: 05/26/2016 Document Reviewed: 05/26/2016 Elsevier Interactive Patient Education  2017 [employer]

## 2016-12-05 NOTE — ED Notes (Signed)
Pt reports swelling of abdomen.  Pt also has sob.  Pt had last dialysis 2/10.  Treated by dt lateef.  Pt states no appetite.  No n/v/d.  No chest pain.  staes abd hurts all over.  Pt has picc line left ej for abx therapy.  Pt alert. nsr on monitor.  Skin warm and dry.  Family with pt

## 2016-12-05 NOTE — ED Notes (Signed)
ED Provider at bedside. 

## 2016-12-05 NOTE — ED Triage Notes (Addendum)
Pt was a dialysis pt from [place] to feb this year. Last dialysis 11/11/16.  Dialysis vascath/permcath to right chest removed today per daughter.  PICC line placed to left IJ today at same time dialysis access removed.  Pt has had SHOB for last week and abdominal swelling.  White productive cough. NAD. Respirations unlabored at this time. PICC line for IV abx r/t knee infection after knee surgery in January. Abdomen is taut on exam.

## 2016-12-06 ENCOUNTER — Inpatient Hospital Stay: Payer: BLUE CROSS/BLUE SHIELD

## 2016-12-06 ENCOUNTER — Encounter: Payer: Self-pay | Admitting: Vascular Surgery

## 2016-12-06 LAB — BASIC METABOLIC PANEL
ANION GAP: 8 (ref 5–15)
BUN: 81 mg/dL — ABNORMAL HIGH (ref 6–20)
CALCIUM: 8.1 mg/dL — AB (ref 8.9–10.3)
CO2: 27 mmol/L (ref 22–32)
Chloride: 100 mmol/L — ABNORMAL LOW (ref 101–111)
Creatinine, Ser: 3.58 mg/dL — ABNORMAL HIGH (ref 0.61–1.24)
GFR, EST AFRICAN AMERICAN: 22 mL/min — AB (ref >60)
GFR, EST NON AFRICAN AMERICAN: 19 mL/min — AB (ref >60)
Glucose, Bld: 108 mg/dL — ABNORMAL HIGH (ref 65–99)
Potassium: 3.7 mmol/L (ref 3.5–5.1)
Sodium: 135 mmol/L (ref 135–145)

## 2016-12-06 LAB — BODY FLUID CELL COUNT WITH DIFFERENTIAL
EOS FL: 0 %
Lymphs, Fluid: 3 %
Monocyte-Macrophage-Serous Fluid: 16 %
NEUTROPHIL FLUID: 81 %
OTHER CELLS FL: 0 %
Total Nucleated Cell Count, Fluid: 1236 cu mm

## 2016-12-06 LAB — GLUCOSE, CAPILLARY
GLUCOSE-CAPILLARY: 103 mg/dL — AB (ref 65–99)
GLUCOSE-CAPILLARY: 76 mg/dL (ref 65–99)
GLUCOSE-CAPILLARY: 94 mg/dL (ref 65–99)
GLUCOSE-CAPILLARY: 99 mg/dL (ref 65–99)
Glucose-Capillary: 48 mg/dL — ABNORMAL LOW (ref 65–99)
Glucose-Capillary: 48 mg/dL — ABNORMAL LOW (ref 65–99)

## 2016-12-06 LAB — CBC
HCT: 25.1 % — ABNORMAL LOW (ref 40.0–52.0)
HEMOGLOBIN: 8 g/dL — AB (ref 13.0–18.0)
MCH: 25.4 pg — ABNORMAL LOW (ref 26.0–34.0)
MCHC: 32 g/dL (ref 32.0–36.0)
MCV: 79.3 fL — ABNORMAL LOW (ref 80.0–100.0)
Platelets: 269 10*3/uL (ref 150–440)
RBC: 3.17 MIL/uL — AB (ref 4.40–5.90)
RDW: 17.8 % — ABNORMAL HIGH (ref 11.5–14.5)
WBC: 7.8 10*3/uL (ref 3.8–10.6)

## 2016-12-06 LAB — LACTATE DEHYDROGENASE, PLEURAL OR PERITONEAL FLUID: LD, Fluid: 68 U/L — ABNORMAL HIGH (ref 3–23)

## 2016-12-06 LAB — GLUCOSE, PLEURAL OR PERITONEAL FLUID: GLUCOSE FL: 83 mg/dL

## 2016-12-06 LAB — PROTEIN, PLEURAL OR PERITONEAL FLUID: Total protein, fluid: 3.1 g/dL

## 2016-12-06 LAB — ALBUMIN, PLEURAL OR PERITONEAL FLUID: Albumin, Fluid: 1.2 g/dL

## 2016-12-06 MED ORDER — SPIRONOLACTONE 25 MG PO TABS
25.0000 mg | ORAL_TABLET | Freq: Every day | ORAL | Status: DC
  Administered 2016-12-06 – 2016-12-08 (×3): 25 mg via ORAL
  Filled 2016-12-06 (×4): qty 1

## 2016-12-06 MED ORDER — DEXTROSE 50 % IV SOLN
25.0000 mL | Freq: Once | INTRAVENOUS | Status: AC
  Administered 2016-12-06: 25 mL via INTRAVENOUS
  Filled 2016-12-06: qty 50

## 2016-12-06 MED ORDER — FUROSEMIDE 10 MG/ML IJ SOLN
8.0000 mg/h | INTRAMUSCULAR | Status: DC
  Administered 2016-12-06 – 2016-12-07 (×2): 8 mg/h via INTRAVENOUS
  Filled 2016-12-06 (×2): qty 25

## 2016-12-06 MED ORDER — TRAZODONE HCL 50 MG PO TABS
50.0000 mg | ORAL_TABLET | Freq: Every evening | ORAL | Status: DC | PRN
  Administered 2016-12-06: 50 mg via ORAL
  Filled 2016-12-06: qty 1

## 2016-12-06 MED ORDER — DEXTROSE 5 % IV SOLN
1.0000 g | INTRAVENOUS | Status: DC
  Administered 2016-12-06 – 2016-12-07 (×2): 1 g via INTRAVENOUS
  Filled 2016-12-06 (×4): qty 10

## 2016-12-06 NOTE — Progress Notes (Signed)
[facility] - [place] at [facility]   PATIENT NAME: Juan Roberson    MR#:  [id number]  DATE OF BIRTH:  01-21-1971  SUBJECTIVE:  CHIEF COMPLAINT:   Chief Complaint  Patient presents with  . Shortness of Breath    the patient is a 46 year old Caucasian male with past medical history significant for history of cardiomyopathy with ejection fraction of 20%, CHF, hypertension, gastritis, DVT, coronary artery disease, MRSA bacteremia in the past,who was admitted in January 2018 for renal failure had to undergo dialysis, he was also treated for bacteremia with 6 week antibiotic therapy  With hemodialysis. His kidney function. However, improved and he has not been receiving dialysis for the past 2 or 3 weeks. He noted lower extremity swelling, abdominal distention, and he presented to the hospital for further evaluation and treatment. In emergency room, he was noted to have significant abdominal swelling. He was also complaining of pain, chest x-ray revealed right base collapse/consolidation, pleural effusion. Patient underwent ultrasound paracentesis today,Taken . Off 8.2 L of fluid.  Labs revealed transudate, 81%  Oh white blood cells were neutrophils. Cultures are pending.  Review of Systems  Constitutional: Negative for chills, fever and weight loss.  HENT: Negative for congestion.   Eyes: Negative for blurred vision and double vision.  Respiratory: Positive for cough and shortness of breath. Negative for sputum production and wheezing.   Cardiovascular: Positive for orthopnea and leg swelling. Negative for chest pain, palpitations and PND.  Gastrointestinal: Positive for abdominal pain. Negative for blood in stool, constipation, diarrhea, nausea and vomiting.  Genitourinary: Negative for dysuria, frequency, hematuria and urgency.  Musculoskeletal: Negative for falls.  Neurological: Negative for dizziness, tremors, focal weakness and headaches.  Endo/Heme/Allergies: Does  not bruise/bleed easily.  Psychiatric/Behavioral: Negative for depression. The patient does not have insomnia.     VITAL SIGNS: Blood pressure 113/72, pulse 76, temperature 98.5 F (36.9 C), temperature source Oral, resp. rate 14, height 6\' 2"  (1.88 m), weight 96.2 kg (212 lb), SpO2 99 %.  PHYSICAL EXAMINATION:   GENERAL:  46 y.o.-year-old patient  Sitting on the bed  In . Mild respiratory distress.  EYES: Pupils equal, round, reactive to light and accommodation. No scleral icterus. Extraocular muscles intact.  HEENT: Head atraumatic, normocephalic. Oropharynx and nasopharynx clear.  NECK:  Supple, no jugular venous distention. No thyroid enlargement, no tenderness.  LUNGS. Moderately  diminished breath sounds  At the right base with some dullness to percussion, rales and crackles, no wheezing. Good air entrance on the left.  Intermittent use of accessory muscles of respiration.  CARDIOVASCULAR: S1, S2 normal. No murmurs, rubs, or gallops.  ABDOMEN: Soft,  Moderate diffuse tenderness was noted bilaterally, mostly in the right upper quadrant and lower part of abdomen, no rebound or guarding, . Some mild distention, fluid wave or on percussion. Bowel sounds present. No organomegaly or mass.  EXTREMITIES:  2-3+ lower extremity and pedal edema,  No cyanosis, or clubbing.  NEUROLOGIC: Cranial nerves II through XII are intact. Muscle strength 5/5 in all extremities. Sensation intact. Gait not checked.  PSYCHIATRIC: The patient is alert and oriented x 3.  SKIN: No obvious rash, lesion, or ulcer.   ORDERS/RESULTS REVIEWED:   CBC  Recent Labs Lab 12/05/16 1431 12/06/16 0415  WBC 8.0 7.8  HGB 8.4* 8.0*  HCT 26.5* 25.1*  PLT 266 269  MCV 81.1 79.3*  MCH 25.7* 25.4*  MCHC 31.7* 32.0  RDW 17.7* 17.8*   ------------------------------------------------------------------------------------------------------------------  Chemistries   Recent  Labs Lab 12/05/16 1431 12/06/16 0415  NA 138  135  K 3.8 3.7  CL 103 100*  CO2 26 27  GLUCOSE 134* 108*  BUN 84* 81*  CREATININE 3.36* 3.58*  CALCIUM 8.0* 8.1*  AST 13*  --   ALT 9*  --   ALKPHOS 78  --   BILITOT 1.3*  --    ------------------------------------------------------------------------------------------------------------------ estimated creatinine clearance is 29.7 mL/min (by C-G formula based on SCr of 3.58 mg/dL (H)). ------------------------------------------------------------------------------------------------------------------ No results for input(s): TSH, T4TOTAL, T3FREE, THYROIDAB in the last 72 hours.  Invalid input(s): FREET3  Cardiac Enzymes  Recent Labs Lab 12/05/16 1431  TROPONINI 0.03*   ------------------------------------------------------------------------------------------------------------------ Invalid input(s): POCBNP ---------------------------------------------------------------------------------------------------------------  RADIOLOGY: Dg Chest 2 View  Result Date: 12/05/2016 CLINICAL DATA:  Productive cough and shortness of Breath EXAM: CHEST  2 VIEW COMPARISON:  12/05/2016 FINDINGS: Cardiac shadow is enlarged. The previously seen dialysis catheter has been removed. Left jugular catheter remains in the SVC. Right-sided pleural effusion is noted similar to that seen on the recent exam. No new focal infiltrate or sizable effusion is seen. IMPRESSION: Stable right basilar changes.  No new focal abnormality is seen. Electronically Signed   By: Alcide Clever M.D.   On: 12/05/2016 15:32   US Paracentesis  Result Date: 12/06/2016 INDICATION: History of ascites, end-stage renal disease and CHF. Suspicion of spontaneous bacterial peritonitis. EXAM: ULTRASOUND GUIDED PARACENTESIS MEDICATIONS: None. COMPLICATIONS: None immediate. PROCEDURE: Informed written consent was obtained from the patient after a discussion of the risks, benefits and alternatives to treatment. A timeout was performed prior to the  initiation of the procedure. Initial ultrasound was performed to localize ascites. The right lower abdomen was prepped and draped in the usual sterile fashion. 1% lidocaine was used for local anesthesia. Following this, a 6 Fr Safe-T-Centesis catheter was introduced. An ultrasound image was saved for documentation purposes. The paracentesis was performed. The catheter was removed and a dressing was applied. The patient tolerated the procedure well without immediate post procedural complication. FINDINGS: A total of approximately 8.2 L of yellowish fluid was removed. Samples were sent to the laboratory as requested by the clinical team. IMPRESSION: Successful ultrasound-guided paracentesis yielding 8.2 liters of peritoneal fluid. Electronically Signed   By: Irish Lack M.D.   On: 12/06/2016 15:46   Dg Chest Port 1 View  Result Date: 12/05/2016 CLINICAL DATA:  Central line placement EXAM: PORTABLE CHEST 1 VIEW COMPARISON:  10/28/2016 FINDINGS: 1157 hours. Low volumes. The cardio pericardial silhouette is enlarged. Right IJ dialysis catheter remains in place with tip overlying the upper right atrium. Left IJ central line is new in the interval with tip overlying the distal SVC. No evidence for left pneumothorax. The right base collapse/ consolidation has progressed in the interval with small right pleural effusion. IMPRESSION: Left IJ central line tip overlies the distal SVC. No left pneumothorax. New right base collapse/ consolidation with effusion. Electronically Signed   By: Kennith Center M.D.   On: 12/05/2016 12:26    EKG:  Orders placed or performed during the hospital encounter of 12/05/16  . ED EKG  . ED EKG    ASSESSMENT AND PLAN:  Principal Problem:   SBP (spontaneous bacterial peritonitis) (HCC)  . #1, spontaneous bacterial peritonitis, continue patient on Rocephin, peritoneal fluid cultures are pending . #2. Severe anasarca, continue patient on Lasix intravenously, discussed with  nephrologist, Dr. Thedore Mins, appreciate his input #3 CKD , stage IV, followed with diuresis  #4. Hypokalemia, resolved #5. Anemia, likely anemia  of chronic disease, patient may benefit from Procrit . #6. Acute on chronic systolic CHF, Lasix intravenously, following in's and outs,Coreg, Aldactone, unable to use ACE inhibitor due to renal failure, relative hypotension.. Echocardiogram was performed in January 2018, revealing ejection fraction of 20-25%, patient had a cardiac catheterization at the same time, showing chronically occluded LAD, medical management was suggested . #7. Right lung collapse, likely due to pleural effusion, due to ascites, recheck chest x-ray in the morning, incentive spirometry to be initiated, continue Zithromax for now, no significant sputum production. Doubt pneumonia Management plans discussed with the patient, family and they are in agreement.   DRUG ALLERGIES:  Allergies  Allergen Reactions  . Sulfur Other (See Comments)    Pt states that this medication causes renal failure.      CODE STATUS:     Code Status Orders        Start     Ordered   12/05/16 2002  Full code  Continuous     12/05/16 2001    Code Status History    Date Active Date Inactive Code Status Order ID Comments User Context   10/28/2016  8:42 PM 10/31/2016  8:35 PM Full Code [id number]  Houston SirenVivek J Sainani, MD Inpatient   10/17/2016  8:18 PM 10/27/2016  6:48 PM Full Code [id number]  Auburn BilberryShreyang Patel, MD Inpatient   09/24/2016  3:36 PM 09/25/2016  2:15 PM Full Code [id number]  Wyatt Hasteavid K Hower, MD ED   07/21/2016 11:05 AM 07/22/2016  3:11 PM Full Code [id number]  Alford Highlandichard Wieting, MD ED   02/28/2016  1:43 AM 03/01/2016  6:06 PM Full Code [id number]  Arnaldo NatalMichael S Diamond, MD Inpatient   12/06/2015  8:39 PM 12/08/2015  2:51 PM Full Code [id number]  Altamese DillingVaibhavkumar Vachhani, MD Inpatient   11/01/2015  4:00 PM 11/03/2015  2:28 PM Full Code [id number]  Wyatt Hasteavid K Hower, MD ED   06/29/2015  2:30 AM 07/01/2015  2:21 PM Full Code  [id number]  Arnaldo NatalMichael S Diamond, MD Inpatient   05/21/2015  4:34 PM 05/25/2015  4:34 PM Full Code [id number]  Linus Galasodd Cline, MD Inpatient   05/18/2015  9:17 PM 05/21/2015  4:34 PM Full Code [id number]  Shaune PollackQing Chen, MD Inpatient   04/05/2015  7:05 AM 04/08/2015  2:00 PM Full Code [id number]  Arnaldo NatalMichael S Diamond, MD Inpatient      TOTAL TIME TAKING CARE OF THIS PATIENT:  40 minutes.   discussed with Dr. Lorelee CoverSingh  Angla Delahunt M.D on 12/06/2016 at 4:20 PM  Between 7am to 6pm - Pager - [phone]  After 6pm go to [email or web address] - password [email or web address] [facility]  [facility]  Office  [phone]  CC: Primary care physician; Corky DownsMASOUD,JAVED, MD

## 2016-12-06 NOTE — Progress Notes (Signed)
Subjective:   Patient known to our practice from outpatient.  Recently he was being dialyzed for acute renal failure.  His PermCath was removed yesterday 3/6.  He was taken off dialysis about 2 weeks ago. Since then, he has started to accumulate fluid.  He states that his abdomen feels more distended and he has tight leg edema.  His appetite is low because of the abdominal distention.  He scheduled for a paracentesis today.  No cough, no sputum production.  No fever or chills.  Objective:  Vital signs in last 24 hours:  Temp:  [98.1 F (36.7 C)-98.9 F (37.2 C)] 98.5 F (36.9 C) (03/07 0400) Pulse Rate:  [74-90] 74 (03/07 1337) Resp:  [17-24] 18 (03/07 1337) BP: (100-133)/(63-88) 100/63 (03/07 1337) SpO2:  [92 %-99 %] 93 % (03/07 1337) Weight:  [96.2 kg (212 lb)] 96.2 kg (212 lb) (03/06 1424)  Weight change:  Filed Weights   12/05/16 1424  Weight: 96.2 kg (212 lb)    Intake/Output:    Intake/Output Summary (Last 24 hours) at 12/06/16 1354 Last data filed at 12/06/16 0818  Gross per 24 hour  Intake              240 ml  Output              350 ml  Net             -110 ml     Physical Exam: General: NAD, sitting up in chair  HEENT Moist oral mucus membranes  Neck supple  Pulm/lungs Normal breathing effort  CVS/Heart 2/6 systolic murmur  Abdomen:  Distended, ascites  Extremities: +++ edema  Neurologic: Alert, oreinted  Skin: No acute rashes          Basic Metabolic Panel:   Recent Labs Lab 12/05/16 1431 12/06/16 0415  NA 138 135  K 3.8 3.7  CL 103 100*  CO2 26 27  GLUCOSE 134* 108*  BUN 84* 81*  CREATININE 3.36* 3.58*  CALCIUM 8.0* 8.1*     CBC:  Recent Labs Lab 12/05/16 1431 12/06/16 0415  WBC 8.0 7.8  HGB 8.4* 8.0*  HCT 26.5* 25.1*  MCV 81.1 79.3*  PLT 266 269      Microbiology:  No results found for this or any previous visit (from the past 720 hour(s)).  Coagulation Studies: No results for input(s): LABPROT, INR in the last 72  hours.  Urinalysis:  Recent Labs  12/05/16 1431  COLORURINE YELLOW*  LABSPEC 1.009  PHURINE 5.0  GLUCOSEU NEGATIVE  HGBUR SMALL*  BILIRUBINUR NEGATIVE  KETONESUR NEGATIVE  PROTEINUR NEGATIVE  NITRITE NEGATIVE  LEUKOCYTESUR NEGATIVE      Imaging: Dg Chest 2 View  Result Date: 12/05/2016 CLINICAL DATA:  Productive cough and shortness of Breath EXAM: CHEST  2 VIEW COMPARISON:  12/05/2016 FINDINGS: Cardiac shadow is enlarged. The previously seen dialysis catheter has been removed. Left jugular catheter remains in the SVC. Right-sided pleural effusion is noted similar to that seen on the recent exam. No new focal infiltrate or sizable effusion is seen. IMPRESSION: Stable right basilar changes.  No new focal abnormality is seen. Electronically Signed   By: Alcide CleverMark  Lukens M.D.   On: 12/05/2016 15:32   Dg Chest Port 1 View  Result Date: 12/05/2016 CLINICAL DATA:  Central line placement EXAM: PORTABLE CHEST 1 VIEW COMPARISON:  10/28/2016 FINDINGS: 1157 hours. Low volumes. The cardio pericardial silhouette is enlarged. Right IJ dialysis catheter remains in place with tip overlying the upper  right atrium. Left IJ central line is new in the interval with tip overlying the distal SVC. No evidence for left pneumothorax. The right base collapse/ consolidation has progressed in the interval with small right pleural effusion. IMPRESSION: Left IJ central line tip overlies the distal SVC. No left pneumothorax. New right base collapse/ consolidation with effusion. Electronically Signed   By: Kennith Center M.D.   On: 12/05/2016 12:26     Medications:   . furosemide (LASIX) infusion     . amiodarone  200 mg Oral Daily  . aspirin EC  81 mg Oral Daily  . carvedilol  3.125 mg Oral BID WC  . cefTRIAXone (ROCEPHIN) IVPB 1 gram/50 mL D5W  1 g Intravenous Q24H  . feeding supplement (NEPRO CARB STEADY)  237 mL Oral BID BM  . fluticasone furoate-vilanterol  1 puff Inhalation Daily  . heparin  5,000 Units  Subcutaneous Q8H  . insulin aspart  0-9 Units Subcutaneous TID WC  . insulin aspart  4 Units Subcutaneous TID WC  . insulin glargine  18 Units Subcutaneous q morning - 10a  . methimazole  10 mg Oral Daily  . pantoprazole  40 mg Oral Daily  . spironolactone  25 mg Oral Daily   bisacodyl, docusate sodium, HYDROcodone-acetaminophen, ondansetron, traZODone, zolpidem  Assessment/ Plan:  46 y.o.white  male with hypertension, insulin dependent diabetes mellitus type 2, diabetic peripheral neuropathy, Severe sytolic CHF EF of 10%, chronically occluded mid LAD with collaterals, diffuse LV hypokinesis and dilated LV, MRSA Knee infection,   1. CKD st 4 Cr 3.6/GFR 19 Close to baseline Check 24 hr urine for Crcl  2. Chronic systolic CHF Anasarca, ascites - iv lasix drip - spironolactone   3. DM-2/CKD - insulin requiring    LOS: 1 Dainel Arcidiacono 3/7/20181:54 PM

## 2016-12-07 LAB — BASIC METABOLIC PANEL
ANION GAP: 7 (ref 5–15)
BUN: 83 mg/dL — ABNORMAL HIGH (ref 6–20)
CO2: 25 mmol/L (ref 22–32)
Calcium: 8.1 mg/dL — ABNORMAL LOW (ref 8.9–10.3)
Chloride: 98 mmol/L — ABNORMAL LOW (ref 101–111)
Creatinine, Ser: 3.76 mg/dL — ABNORMAL HIGH (ref 0.61–1.24)
GFR calc Af Amer: 21 mL/min — ABNORMAL LOW (ref >60)
GFR calc non Af Amer: 18 mL/min — ABNORMAL LOW (ref >60)
GLUCOSE: 114 mg/dL — AB (ref 65–99)
Potassium: 4 mmol/L (ref 3.5–5.1)
Sodium: 130 mmol/L — ABNORMAL LOW (ref 135–145)

## 2016-12-07 LAB — GLUCOSE, CAPILLARY
GLUCOSE-CAPILLARY: 122 mg/dL — AB (ref 65–99)
GLUCOSE-CAPILLARY: 140 mg/dL — AB (ref 65–99)
GLUCOSE-CAPILLARY: 145 mg/dL — AB (ref 65–99)
Glucose-Capillary: 98 mg/dL (ref 65–99)
Glucose-Capillary: 127 mg/dL — ABNORMAL HIGH (ref 65–99)

## 2016-12-07 LAB — HEMOGLOBIN: Hemoglobin: 8.1 g/dL — ABNORMAL LOW (ref 13.0–18.0)

## 2016-12-07 LAB — PATHOLOGIST SMEAR REVIEW

## 2016-12-07 NOTE — Progress Notes (Signed)
Initial Nutrition Assessment  DOCUMENTATION CODES:   Not applicable  INTERVENTION:  - Continue Nepro supplementation BID, each supplement provides 425 calories and 19.1 grams protein - If patient continues to refuse supplements recommend addition of snacks  - Request new weight s/p paracentesis  NUTRITION DIAGNOSIS:   Increased nutrient needs related to acute illness as evidenced by estimated needs.  GOAL:   Patient will meet greater than or equal to 90% of their needs  MONITOR:   Supplement acceptance, PO intake, I & O's, Weight trends  REASON FOR ASSESSMENT:   Rounds     ASSESSMENT:   46 y.o. Male with PMH of RSV infection of right knee, MRSA, CHF, CKD stage IV-required HD/stopped, Essential HTN, Gastritis, Left leg DVT, MI, Oseteomyelitis presents with SBP  Pt did not want to discuss nutrition at this time.  Pt reports no weight loss except for the fluid they removed yesterday. Pt underwent paracentesis on 3/7, MD removed 8.2 L of fluid. Pt's weight history is fluctuating and unreliable.  Pt reports a good appetite PTA, reports eating 3 meals/day.   Pt's tray at bedside was >75% completed. No other meal completion percentages are recorded. Per chart review pt has been refusing nutritional supplements.   No plans for HD at this point, pt is on a lasix drip.   Labs reviewed; CBG (48-140), Na (130) Medication reviewed; Protonix, Lasix drip,   Nutrition-focused physical exam not completed at this time. Plan to complete on follow up. Per RN pt has mild low extremity bilateral edema  Diet Order:  Diet renal/carb modified with fluid restriction Diet-HS Snack? Nothing; Room service appropriate? Yes; Fluid consistency: Thin  Skin:  Reviewed, no issues  Last BM:  3/7  Height:   Ht Readings from Last 1 Encounters:  12/05/16 6\' 2"  (1.88 m)    Weight:   Wt Readings from Last 1 Encounters:  12/05/16 212 lb (96.2 kg)    Ideal Body Weight:  86.4 kg  BMI:  Body  mass index is 27.22 kg/m.  Estimated Nutritional Needs:   Kcal:  2200-2400  Protein:  115-125 grams  Fluid:  >/= 2 L/d  EDUCATION NEEDS:   Education needs no appropriate at this time  [employer] Dietetic Intern

## 2016-12-07 NOTE — Progress Notes (Signed)
Subjective:   Patient known to our practice from outpatient.  Recently he was being dialyzed for acute renal failure.  His PermCath was removed yesterday 3/6.  He was taken off dialysis about 2 weeks ago. He feels better today Paracentesis - 8.2 L removed "feels like a new man" Leg edema is better, Abdomen is softer Lasix gtt continued 24 hr urine collection in progress  Objective:  Vital signs in last 24 hours:  Temp:  [98.1 F (36.7 C)-99 F (37.2 C)] 98.4 F (36.9 C) (03/08 1355) Pulse Rate:  [77-86] 77 (03/08 1256) Resp:  [18] 18 (03/08 1256) BP: (100-122)/(60-76) 117/69 (03/08 1256) SpO2:  [89 %-98 %] 98 % (03/08 1256)  Weight change:  Filed Weights   12/05/16 1424  Weight: 96.2 kg (212 lb)    Intake/Output:    Intake/Output Summary (Last 24 hours) at 12/07/16 1614 Last data filed at 12/07/16 1534  Gross per 24 hour  Intake           236.33 ml  Output             1975 ml  Net         -1738.67 ml     Physical Exam: General: NAD, sitting up in chair  HEENT Moist oral mucus membranes  Neck supple  Pulm/lungs Normal breathing effort  CVS/Heart 2/6 systolic murmur  Abdomen:  Ascites, soft  Extremities: ++ edema  Neurologic: Alert, oreinted  Skin: No acute rashes          Basic Metabolic Panel:   Recent Labs Lab 12/05/16 1431 12/06/16 0415 12/07/16 0405  NA 138 135 130*  K 3.8 3.7 4.0  CL 103 100* 98*  CO2 26 27 25   GLUCOSE 134* 108* 114*  BUN 84* 81* 83*  CREATININE 3.36* 3.58* 3.76*  CALCIUM 8.0* 8.1* 8.1*     CBC:  Recent Labs Lab 12/05/16 1431 12/06/16 0415 12/07/16 0405  WBC 8.0 7.8  --   HGB 8.4* 8.0* 8.1*  HCT 26.5* 25.1*  --   MCV 81.1 79.3*  --   PLT 266 269  --       Microbiology:  Recent Results (from the past 720 hour(s))  Body fluid culture     Status: None (Preliminary result)   Collection Time: 12/06/16  1:45 PM  Result Value Ref Range Status   Specimen Description PERITONEAL  Final   Special Requests NONE   Final   Gram Stain   Final    ABUNDANT WBC PRESENT, PREDOMINANTLY PMN NO ORGANISMS SEEN    Culture   Final    NO GROWTH < 24 HOURS Performed at [facility], [address], [place], [place] [address]    Report Status PENDING  Incomplete    Coagulation Studies: No results for input(s): LABPROT, INR in the last 72 hours.  Urinalysis:  Recent Labs  12/05/16 1431  COLORURINE YELLOW*  LABSPEC 1.009  PHURINE 5.0  GLUCOSEU NEGATIVE  HGBUR SMALL*  BILIRUBINUR NEGATIVE  KETONESUR NEGATIVE  PROTEINUR NEGATIVE  NITRITE NEGATIVE  LEUKOCYTESUR NEGATIVE      Imaging: US Paracentesis  Result Date: 12/06/2016 INDICATION: History of ascites, end-stage renal disease and CHF. Suspicion of spontaneous bacterial peritonitis. EXAM: ULTRASOUND GUIDED PARACENTESIS MEDICATIONS: None. COMPLICATIONS: None immediate. PROCEDURE: Informed written consent was obtained from the patient after a discussion of the risks, benefits and alternatives to treatment. A timeout was performed prior to the initiation of the procedure. Initial ultrasound was performed to localize ascites. The  right lower abdomen was prepped and draped in the usual sterile fashion. 1% lidocaine was used for local anesthesia. Following this, a 6 Fr Safe-T-Centesis catheter was introduced. An ultrasound image was saved for documentation purposes. The paracentesis was performed. The catheter was removed and a dressing was applied. The patient tolerated the procedure well without immediate post procedural complication. FINDINGS: A total of approximately 8.2 L of yellowish fluid was removed. Samples were sent to the laboratory as requested by the clinical team. IMPRESSION: Successful ultrasound-guided paracentesis yielding 8.2 liters of peritoneal fluid. Electronically Signed   By: Irish LackGlenn  Yamagata M.D.   On: 12/06/2016 15:46     Medications:   . furosemide (LASIX) infusion 8 mg/hr (12/06/16 1520)   . amiodarone  200 mg Oral Daily  .  aspirin EC  81 mg Oral Daily  . carvedilol  3.125 mg Oral BID WC  . cefTRIAXone (ROCEPHIN) IVPB 1 gram/50 mL D5W  1 g Intravenous Q24H  . feeding supplement (NEPRO CARB STEADY)  237 mL Oral BID BM  . fluticasone furoate-vilanterol  1 puff Inhalation Daily  . heparin  5,000 Units Subcutaneous Q8H  . insulin aspart  0-9 Units Subcutaneous TID WC  . methimazole  10 mg Oral Daily  . pantoprazole  40 mg Oral Daily  . spironolactone  25 mg Oral Daily   bisacodyl, docusate sodium, HYDROcodone-acetaminophen, ondansetron, traZODone, zolpidem  Assessment/ Plan:  46 y.o. white  male with hypertension, insulin dependent diabetes mellitus type 2, diabetic peripheral neuropathy, Severe sytolic CHF EF of 10%, chronically occluded mid LAD with collaterals, diffuse LV hypokinesis and dilated LV, MRSA Knee infection,   1. CKD st 4 Cr 3.76/GFR 18 Close to baseline Check 24 hr urine for Crcl  2. Chronic systolic CHF Anasarca, ascites - iv lasix drip - spironolactone  3. DM-2/CKD - insulin requiring    LOS: 2 Juan Roberson 3/8/20184:14 PM

## 2016-12-07 NOTE — Progress Notes (Signed)
[facility] - [place] at [facility]   PATIENT NAME: Juan ButtersWaltzie Roberson    MR#:  [id number]  DATE OF BIRTH:  05/31/1971  SUBJECTIVE:  CHIEF COMPLAINT:   Chief Complaint  Patient presents with  . Shortness of Breath   - Had paracentesis done and 8.2 L taken out yesterday. Feels much better. -Renal function is stable. Remains on Lasix drip  REVIEW OF SYSTEMS:  Review of Systems  Constitutional: Positive for malaise/fatigue. Negative for chills and fever.  HENT: Negative for congestion, ear discharge, hearing loss and nosebleeds.   Eyes: Negative for blurred vision and double vision.  Respiratory: Negative for cough, shortness of breath and wheezing.   Cardiovascular: Positive for leg swelling. Negative for chest pain and palpitations.  Gastrointestinal: Negative for abdominal pain, constipation, diarrhea, nausea and vomiting.  Genitourinary: Negative for dysuria.  Musculoskeletal: Positive for back pain.  Neurological: Negative for dizziness, seizures and headaches.  Psychiatric/Behavioral: Negative for depression.    DRUG ALLERGIES:   Allergies  Allergen Reactions  . Sulfur Other (See Comments)    Pt states that this medication causes renal failure.      VITALS:  Blood pressure 117/69, pulse 77, temperature 98.1 F (36.7 C), temperature source Oral, resp. rate 18, height 6\' 2"  (1.88 m), weight 96.2 kg (212 lb), SpO2 98 %.  PHYSICAL EXAMINATION:  Physical Exam  GENERAL:  46 y.o.-year-old patient lying in the bed with no acute distress.  EYES: Pupils equal, round, reactive to light and accommodation. No scleral icterus. Extraocular muscles intact.  HEENT: Head atraumatic, normocephalic. Oropharynx and nasopharynx clear.  NECK:  Supple, no jugular venous distention. No thyroid enlargement, no tenderness.  LUNGS: Normal breath sounds bilaterally, no wheezing, rales,rhonchi or crepitation. No use of accessory muscles of respiration.  CARDIOVASCULAR: S1, S2  normal. No murmurs, rubs, or gallops.  ABDOMEN: Soft, nontender, distended. Bowel sounds present. No organomegaly or mass.  EXTREMITIES: No cyanosis, or clubbing. 1+ leg edema noted. NEUROLOGIC: Cranial nerves II through XII are intact. Muscle strength 5/5 in all extremities. Sensation intact. Gait not checked.  PSYCHIATRIC: The patient is alert and oriented x 3.  SKIN: No obvious rash, lesion, or ulcer.    LABORATORY PANEL:   CBC  Recent Labs Lab 12/06/16 0415 12/07/16 0405  WBC 7.8  --   HGB 8.0* 8.1*  HCT 25.1*  --   PLT 269  --    ------------------------------------------------------------------------------------------------------------------  Chemistries   Recent Labs Lab 12/05/16 1431  12/07/16 0405  NA 138  < > 130*  K 3.8  < > 4.0  CL 103  < > 98*  CO2 26  < > 25  GLUCOSE 134*  < > 114*  BUN 84*  < > 83*  CREATININE 3.36*  < > 3.76*  CALCIUM 8.0*  < > 8.1*  AST 13*  --   --   ALT 9*  --   --   ALKPHOS 78  --   --   BILITOT 1.3*  --   --   < > = values in this interval not displayed. ------------------------------------------------------------------------------------------------------------------  Cardiac Enzymes  Recent Labs Lab 12/05/16 1431  TROPONINI 0.03*   ------------------------------------------------------------------------------------------------------------------  RADIOLOGY:  Dg Chest 2 View  Result Date: 12/05/2016 CLINICAL DATA:  Productive cough and shortness of Breath EXAM: CHEST  2 VIEW COMPARISON:  12/05/2016 FINDINGS: Cardiac shadow is enlarged. The previously seen dialysis catheter has been removed. Left jugular catheter remains in the SVC. Right-sided pleural effusion is noted similar  to that seen on the recent exam. No new focal infiltrate or sizable effusion is seen. IMPRESSION: Stable right basilar changes.  No new focal abnormality is seen. Electronically Signed   By: Alcide Clever M.D.   On: 12/05/2016 15:32   US  Paracentesis  Result Date: 12/06/2016 INDICATION: History of ascites, end-stage renal disease and CHF. Suspicion of spontaneous bacterial peritonitis. EXAM: ULTRASOUND GUIDED PARACENTESIS MEDICATIONS: None. COMPLICATIONS: None immediate. PROCEDURE: Informed written consent was obtained from the patient after a discussion of the risks, benefits and alternatives to treatment. A timeout was performed prior to the initiation of the procedure. Initial ultrasound was performed to localize ascites. The right lower abdomen was prepped and draped in the usual sterile fashion. 1% lidocaine was used for local anesthesia. Following this, a 6 Fr Safe-T-Centesis catheter was introduced. An ultrasound image was saved for documentation purposes. The paracentesis was performed. The catheter was removed and a dressing was applied. The patient tolerated the procedure well without immediate post procedural complication. FINDINGS: A total of approximately 8.2 L of yellowish fluid was removed. Samples were sent to the laboratory as requested by the clinical team. IMPRESSION: Successful ultrasound-guided paracentesis yielding 8.2 liters of peritoneal fluid. Electronically Signed   By: Irish Lack M.D.   On: 12/06/2016 15:46    EKG:   Orders placed or performed during the hospital encounter of 12/05/16  . ED EKG  . ED EKG    ASSESSMENT AND PLAN:   46 year old male with past medical history significant for nonischemic cardiomyopathy very tearful 10%, systolic CHF, hypertension, coronary artery disease, admission for MRSA bacteremia resulting in acute renal failure requiring dialysis in January 2018 who has been off of dialysis for almost 2 weeks now presents to hospital secondary to anasarca.  #1 anasarca-secondary to increased fluid intake, also has underlying CK D stage IV. -Remains on Lasix drip. Improving. -Ultrasound-guided paracentesis taking off 8.2 L of fluid yesterday. -On empiric Rocephin. Cultures are  pending at this time. -Patient has a PICC line and finished six-week antibiotic treatment on 11/29/16 for MRSA bacteremia from right septic knee  #2 CKD stage 4- baseline cr at 3.2, monitor On 24hr crcl Appreciate nephrology consult.  #3 acute on chronic systolic CHF exacerbation-with extremely low ejection fraction presenting with anasarca and ascites. -Status post paracentesis and also improving anasarca with Lasix drip. -Continue Lasix drip for today and change to oral tomorrow and also continue spironolactone -Fluid restriction and low sodium diet explained  #4 hypothyroidism- continue methimazole  #5 chronic A. fib-on amiodarone. Rate is controlled. No anticoagulation due to anemia  #6 DVT prophylaxis-on subcutaneous heparin  #7 DM-  sugars are within normal limits. Continue sliding scale insulin. Repeat A1c ordered. -During last admission one month ago patient was on Toujeo and NovoLog   Physical Therapy consult    All the records are reviewed and case discussed with Care Management/Social Workerr. Management plans discussed with the patient, family and they are in agreement.  CODE STATUS: Full Code  TOTAL TIME TAKING CARE OF THIS PATIENT: 38 minutes.   POSSIBLE D/C IN 1-2 DAYS, DEPENDING ON CLINICAL CONDITION.   Enid Baas M.D on 12/07/2016 at 1:48 PM  Between 7am to 6pm - Pager - [phone]  After 6pm go to [email or web address] - Social research officer, government  [facility]  Office  [phone]  CC: Primary care physician; Corky Downs, MD

## 2016-12-08 LAB — BASIC METABOLIC PANEL
Anion gap: 7 (ref 5–15)
BUN: 84 mg/dL — AB (ref 6–20)
CO2: 27 mmol/L (ref 22–32)
CREATININE: 3.73 mg/dL — AB (ref 0.61–1.24)
Calcium: 8.1 mg/dL — ABNORMAL LOW (ref 8.9–10.3)
Chloride: 98 mmol/L — ABNORMAL LOW (ref 101–111)
GFR calc Af Amer: 21 mL/min — ABNORMAL LOW (ref >60)
GFR, EST NON AFRICAN AMERICAN: 18 mL/min — AB (ref >60)
GLUCOSE: 150 mg/dL — AB (ref 65–99)
Potassium: 4.1 mmol/L (ref 3.5–5.1)
SODIUM: 132 mmol/L — AB (ref 135–145)

## 2016-12-08 LAB — HEMOGLOBIN A1C
Hgb A1c MFr Bld: 6.1 % — ABNORMAL HIGH (ref 4.8–5.6)
MEAN PLASMA GLUCOSE: 128 mg/dL

## 2016-12-08 LAB — GLUCOSE, CAPILLARY: GLUCOSE-CAPILLARY: 163 mg/dL — AB (ref 65–99)

## 2016-12-08 MED ORDER — SPIRONOLACTONE 25 MG PO TABS: 25.0000 mg | ORAL_TABLET | Freq: Every day | ORAL | 1 refills | Status: DC

## 2016-12-08 MED ORDER — CIPROFLOXACIN HCL 500 MG PO TABS: 500.0000 mg | ORAL_TABLET | Freq: Every day | ORAL | 0 refills | Status: DC

## 2016-12-08 NOTE — Progress Notes (Signed)
Subjective:   Patient known to our practice from outpatient.  Recently he was being dialyzed for acute renal failure.  His PermCath was removed on 3/6.  He was taken off dialysis about 2 weeks ago. He feels better today Paracentesis - 8.2 L removed "feels like a new man" Leg edema is better, Abdomen is softer; appetite has improved    Objective:  Vital signs in last 24 hours:  Temp:  [97.9 F (36.6 C)-98 F (36.7 C)] 98 F (36.7 C) (03/09 0415) Pulse Rate:  [74-79] 79 (03/09 0829) Resp:  [18-19] 19 (03/09 0415) BP: (108-131)/(67-83) 131/83 (03/09 0829) SpO2:  [98 %-99 %] 98 % (03/09 0415) Weight:  [88.2 kg (194 lb 8 oz)] 88.2 kg (194 lb 8 oz) (03/09 0500)  Weight change:  Filed Weights   12/05/16 1424 12/08/16 0500  Weight: 96.2 kg (212 lb) 88.2 kg (194 lb 8 oz)    Intake/Output:    Intake/Output Summary (Last 24 hours) at 12/08/16 1527 Last data filed at 12/08/16 0727  Gross per 24 hour  Intake              170 ml  Output             1275 ml  Net            -1105 ml     Physical Exam: General: NAD, sitting up in chair  HEENT Moist oral mucus membranes  Neck supple  Pulm/lungs Normal breathing effort  CVS/Heart 2/6 systolic murmur  Abdomen:  Ascites, soft  Extremities: + edema  Neurologic: Alert, oreinted  Skin: No acute rashes          Basic Metabolic Panel:   Recent Labs Lab 12/05/16 1431 12/06/16 0415 12/07/16 0405 12/08/16 0413  NA 138 135 130* 132*  K 3.8 3.7 4.0 4.1  CL 103 100* 98* 98*  CO2 26 27 25 27   GLUCOSE 134* 108* 114* 150*  BUN 84* 81* 83* 84*  CREATININE 3.36* 3.58* 3.76* 3.73*  CALCIUM 8.0* 8.1* 8.1* 8.1*     CBC:  Recent Labs Lab 12/05/16 1431 12/06/16 0415 12/07/16 0405  WBC 8.0 7.8  --   HGB 8.4* 8.0* 8.1*  HCT 26.5* 25.1*  --   MCV 81.1 79.3*  --   PLT 266 269  --       Microbiology:  Recent Results (from the past 720 hour(s))  Body fluid culture     Status: None (Preliminary result)   Collection Time:  12/06/16  1:45 PM  Result Value Ref Range Status   Specimen Description PERITONEAL  Final   Special Requests NONE  Final   Gram Stain   Final    ABUNDANT WBC PRESENT, PREDOMINANTLY PMN NO ORGANISMS SEEN    Culture   Final    NO GROWTH 2 DAYS Performed at [facility], [address] [place], [place] [address]    Report Status PENDING  Incomplete    Coagulation Studies: No results for input(s): LABPROT, INR in the last 72 hours.  Urinalysis: No results for input(s): COLORURINE, LABSPEC, PHURINE, GLUCOSEU, HGBUR, BILIRUBINUR, KETONESUR, PROTEINUR, UROBILINOGEN, NITRITE, LEUKOCYTESUR in the last 72 hours.  Invalid input(s): APPERANCEUR    Imaging: No results found.   Medications:       Assessment/ Plan:  46 y.o. white  male with hypertension, insulin dependent diabetes mellitus type 2, diabetic peripheral neuropathy, Severe sytolic CHF EF of 10%, chronically occluded mid LAD with collaterals, diffuse LV hypokinesis and  dilated LV, MRSA Knee infection,   1. CKD st 4 Cr 3.73/GFR 18 Close to baseline   2. Chronic systolic CHF Anasarca, ascites - continue home dose of lasix - 80 BID - spironolactone  3. DM-2/CKD - insulin requiring  4. SBP - oral Antibiotics - PICC to be removed - CIPRO for 10 days  F/u outpatient   LOS: 3 Ziggy Chanthavong 3/9/20183:27 PM

## 2016-12-08 NOTE — Discharge Summary (Signed)
[facility] - [place] at [facility]   PATIENT NAME: Juan Roberson    MR#:  [id number]  DATE OF BIRTH:  12-17-1970  DATE OF ADMISSION:  12/05/2016   ADMITTING PHYSICIAN: Altamese Dilling, MD  DATE OF DISCHARGE: 12/08/2016 12:06 PM  PRIMARY CARE PHYSICIAN: MASOUD,JAVED, MD   ADMISSION DIAGNOSIS:   Shortness of breath [R06.02] Spontaneous bacterial peritonitis (HCC) [K65.2] Pain [R52] Generalized abdominal pain [R10.84] Pleural effusion on right [J90]  DISCHARGE DIAGNOSIS:   Principal Problem:   SBP (spontaneous bacterial peritonitis) (HCC)   SECONDARY DIAGNOSIS:   Past Medical History:  Diagnosis Date  . Cellulitis and abscess of foot    Left-Dr. Ether Griffins  . CHF (congestive heart failure) (HCC)    EF 20%  . CKD (chronic kidney disease), stage III   . Diabetes mellitus without complication (HCC)    a. Dx ~ 1996.  [name] Dysrhythmia   . Essential hypertension   . Gastritis    a. 04/2015 hematemesis -> EGD: gastritis, esophagitis, duodenitis.  No active bleeding.  PPI added.  [name] GERD (gastroesophageal reflux disease)   . Left leg DVT (HCC)    a. Dx 05/2015 -> Coumadin.  . MI (myocardial infarction)   . Osteomyelitis (HCC)    a. 05/2015 L foot.    HOSPITAL COURSE:   46 year old male with past medical history significant for nonischemic cardiomyopathy very tearful 10%, systolic CHF, hypertension, coronary artery disease, admission for MRSA bacteremia resulting in acute renal failure requiring dialysis in January 2018 who has been off of dialysis for almost 2 weeks now presents to hospital secondary to anasarca.  #1 anasarca-secondary to increased fluid intake, also has underlying CK D stage IV. -received Lasix drip. Much improved after paracentesis. -Ultrasound-guided paracentesis taking off 8.2 L of fluid. -received Rocephin for SBP as elevated wbc on ascites tap. Cultures are negative. -Patient has a PICC line and finished six-week antibiotic treatment  on 11/29/16 for MRSA bacteremia from right septic knee- so PICC line removed at discharge - will be discharged on cipro for SBP for 10 more days  #2 CKD stage 4- baseline cr at 3.7, monitor Appreciate nephrology consult. F/u with nephrology as outpatient  #3 acute on chronic systolic CHF exacerbation-with extremely low ejection fraction presenting with anasarca and ascites. -Status post paracentesis and also improved anasarca with Lasix drip. -Continue Lasix bid at home dose and also continue spironolactone -Fluid restriction and low sodium diet explained  #4 hypothyroidism- continue methimazole  #5 chronic A. fib-on amiodarone. Rate is controlled. No anticoagulation due to anemia  #6 DM-  sugars are within normal limits. Continue novolog at discharge - discontinue toujeou.  Patient will be discharged home today Patient refused home health services.   DISCHARGE CONDITIONS:   Guarded  CONSULTS OBTAINED:   Treatment Team:  Mosetta Pigeon, MD  DRUG ALLERGIES:   Allergies  Allergen Reactions  . Sulfur Other (See Comments)    Pt states that this medication causes renal failure.     DISCHARGE MEDICATIONS:   Allergies as of 12/08/2016      Reactions   Sulfur Other (See Comments)   Pt states that this medication causes renal failure.        Medication List    STOP taking these medications   alum & mag hydroxide-simeth 200-200-20 MG/5ML suspension Commonly known as:  MAALOX/MYLANTA   Insulin Glargine 300 UNIT/ML Sopn Commonly known as:  TOUJEO SOLOSTAR   ondansetron 4 MG disintegrating tablet Commonly known as:  ZOFRAN ODT  TAKE these medications   amiodarone 200 MG tablet Commonly known as:  PACERONE Take 1 tablet (200 mg total) by mouth daily.   aspirin 81 MG EC tablet Take 1 tablet (81 mg total) by mouth daily.   bisacodyl 10 MG suppository Commonly known as:  DULCOLAX Place 1 suppository (10 mg total) rectally daily as needed for moderate  constipation.   BREO ELLIPTA 100-25 MCG/INH Aepb Generic drug:  fluticasone furoate-vilanterol Inhale 1 puff into the lungs daily.   carvedilol 3.125 MG tablet Commonly known as:  COREG Take 1 tablet (3.125 mg total) by mouth 2 (two) times daily with a meal.   ciprofloxacin 500 MG tablet Commonly known as:  CIPRO Take 1 tablet (500 mg total) by mouth daily. X 10 more days   feeding supplement (NEPRO CARB STEADY) Liqd Take 237 mLs by mouth 2 (two) times daily between meals.   furosemide 80 MG tablet Commonly known as:  LASIX Take 1 tablet (80 mg total) by mouth 2 (two) times daily.   HYDROcodone-acetaminophen 7.5-325 MG tablet Commonly known as:  NORCO Take 1 tablet by mouth every 6 (six) hours as needed for moderate pain (breakthrough pain).   insulin aspart 100 UNIT/ML injection Commonly known as:  novoLOG Inject 4 Units into the skin 3 (three) times daily with meals.   methimazole 10 MG tablet Commonly known as:  TAPAZOLE Take 1 tablet (10 mg total) by mouth daily.   pantoprazole 40 MG tablet Commonly known as:  PROTONIX Take 1 tablet (40 mg total) by mouth daily.   spironolactone 25 MG tablet Commonly known as:  ALDACTONE Take 1 tablet (25 mg total) by mouth daily. Start taking on:  12/09/2016   zolpidem 5 MG tablet Commonly known as:  AMBIEN Take 5 mg by mouth at bedtime as needed for sleep.        DISCHARGE INSTRUCTIONS:   1. PCP follow-up in 1-2 weeks 2. ID follow up in 2 weeks 3. Nephrology follow-up in 1 week  DIET:   Cardiac diet  ACTIVITY:   Activity as tolerated  OXYGEN:   Home Oxygen: No.  Oxygen Delivery: room air  DISCHARGE LOCATION:   home   If you experience worsening of your admission symptoms, develop shortness of breath, life threatening emergency, suicidal or homicidal thoughts you must seek medical attention immediately by calling 911 or calling your MD immediately  if symptoms less severe.  You Must read complete  instructions/literature along with all the possible adverse reactions/side effects for all the Medicines you take and that have been prescribed to you. Take any new Medicines after you have completely understood and accpet all the possible adverse reactions/side effects.   Please note  You were cared for by a hospitalist during your hospital stay. If you have any questions about your discharge medications or the care you received while you were in the hospital after you are discharged, you can call the unit and asked to speak with the hospitalist on call if the hospitalist that took care of you is not available. Once you are discharged, your primary care physician will handle any further medical issues. Please note that NO REFILLS for any discharge medications will be authorized once you are discharged, as it is imperative that you return to your primary care physician (or establish a relationship with a primary care physician if you do not have one) for your aftercare needs so that they can reassess your need for medications and monitor your lab values.  On the day of Discharge:  VITAL SIGNS:   Blood pressure 131/83, pulse 79, temperature 98 F (36.7 C), temperature source Oral, resp. rate 19, height 6\' 2"  (1.88 m), weight 88.2 kg (194 lb 8 oz), SpO2 98 %.  PHYSICAL EXAMINATION:    GENERAL:  46 y.o.-year-old patient lying in the bed with no acute distress.  EYES: Pupils equal, round, reactive to light and accommodation. No scleral icterus. Extraocular muscles intact.  HEENT: Head atraumatic, normocephalic. Oropharynx and nasopharynx clear.  NECK:  Supple, no jugular venous distention. No thyroid enlargement, no tenderness.  LUNGS: Normal breath sounds bilaterally, no wheezing, rales,rhonchi or crepitation. No use of accessory muscles of respiration.  CARDIOVASCULAR: S1, S2 normal. No murmurs, rubs, or gallops.  ABDOMEN: Soft, nontender, distended. Bowel sounds present. No organomegaly or  mass.  EXTREMITIES: No cyanosis, or clubbing. 1+ leg edema noted. NEUROLOGIC: Cranial nerves II through XII are intact. Muscle strength 5/5 in all extremities. Sensation intact. Gait not checked.  PSYCHIATRIC: The patient is alert and oriented x 3.  SKIN: No obvious rash, lesion, or ulcer.   DATA REVIEW:   CBC  Recent Labs Lab 12/06/16 0415 12/07/16 0405  WBC 7.8  --   HGB 8.0* 8.1*  HCT 25.1*  --   PLT 269  --     Chemistries   Recent Labs Lab 12/05/16 1431  12/08/16 0413  NA 138  < > 132*  K 3.8  < > 4.1  CL 103  < > 98*  CO2 26  < > 27  GLUCOSE 134*  < > 150*  BUN 84*  < > 84*  CREATININE 3.36*  < > 3.73*  CALCIUM 8.0*  < > 8.1*  AST 13*  --   --   ALT 9*  --   --   ALKPHOS 78  --   --   BILITOT 1.3*  --   --   < > = values in this interval not displayed.   Microbiology Results  Results for orders placed or performed during the hospital encounter of 12/05/16  Body fluid culture     Status: None (Preliminary result)   Collection Time: 12/06/16  1:45 PM  Result Value Ref Range Status   Specimen Description PERITONEAL  Final   Special Requests NONE  Final   Gram Stain   Final    ABUNDANT WBC PRESENT, PREDOMINANTLY PMN NO ORGANISMS SEEN    Culture   Final    NO GROWTH 2 DAYS Performed at [facility], [address] [place], [place] [address]    Report Status PENDING  Incomplete    RADIOLOGY:  No results found.   Management plans discussed with the patient, family and they are in agreement.  CODE STATUS:     Code Status Orders        Start     Ordered   12/05/16 2002  Full code  Continuous     12/05/16 2001    Code Status History    Date Active Date Inactive Code Status Order ID Comments User Context   10/28/2016  8:42 PM 10/31/2016  8:35 PM Full Code [id number]  Houston Siren, MD Inpatient   10/17/2016  8:18 PM 10/27/2016  6:48 PM Full Code [id number]  Auburn Bilberry, MD Inpatient   09/24/2016  3:36 PM 09/25/2016  2:15 PM Full Code  [id number]  Wyatt Haste, MD ED   07/21/2016 11:05 AM 07/22/2016  3:11 PM Full Code [id number]  Richard  Renae Gloss, MD ED   02/28/2016  1:43 AM 03/01/2016  6:06 PM Full Code [id number]  Arnaldo Natal, MD Inpatient   12/06/2015  8:39 PM 12/08/2015  2:51 PM Full Code [id number]  Altamese Dilling, MD Inpatient   11/01/2015  4:00 PM 11/03/2015  2:28 PM Full Code [id number]  Wyatt Haste, MD ED   06/29/2015  2:30 AM 07/01/2015  2:21 PM Full Code [id number]  Arnaldo Natal, MD Inpatient   05/21/2015  4:34 PM 05/25/2015  4:34 PM Full Code [id number]  Linus Galas, MD Inpatient   05/18/2015  9:17 PM 05/21/2015  4:34 PM Full Code [id number]  Shaune Pollack, MD Inpatient   04/05/2015  7:05 AM 04/08/2015  2:00 PM Full Code [id number]  Arnaldo Natal, MD Inpatient      TOTAL TIME TAKING CARE OF THIS PATIENT: 38 minutes.    Enid Baas M.D on 12/08/2016 at 2:28 PM  Between 7am to 6pm - Pager - [phone]  After 6pm go to [email or web address] - Social research officer, government  [facility]  Office  [phone]  CC: Primary care physician; Corky Downs, MD   Note: This dictation was prepared with Dragon dictation along with smaller phrase technology. Any transcriptional errors that result from this process are unintentional.

## 2016-12-08 NOTE — Progress Notes (Signed)
Patient discharge teaching given, including activity, diet, follow-up appoints, and medications. Patient verbalized understanding of all discharge instructions. PICC access was d/c'd. Vitals are stable. Skin is intact except as charted in most recent assessments. Pt to be escorted out by [employer] student nurse, to be driven home by family.  Morganna Styles [place]

## 2016-12-08 NOTE — Progress Notes (Signed)
Double Lumen PICC was removed per order, all 23 cm were present on removal. Will proceed with discharge.    Juan Roberson [employer]

## 2016-12-08 NOTE — Care Management (Signed)
Patient to discharge home today.  Patient lives at home with his daughter.  Patient was followed at home by [facility] through 11/20/16 when he discontinued services.  Patient has declined home health services at discharge this admission.  Not further RNCM needs identified.

## 2016-12-08 NOTE — Discharge Instructions (Signed)
[facility] Failure [facility] appointment on December 14, 2016 at 11:00am with Juan Kindredina Catalyna Reilly, FNP. Please call [phone] to reschedule.

## 2016-12-10 LAB — BODY FLUID CULTURE: Culture: NO GROWTH

## 2016-12-14 ENCOUNTER — Ambulatory Visit: Payer: BLUE CROSS/BLUE SHIELD | Admitting: Family

## 2016-12-25 ENCOUNTER — Ambulatory Visit: Payer: BLUE CROSS/BLUE SHIELD | Admitting: Family

## 2017-01-02 ENCOUNTER — Telehealth: Payer: Self-pay

## 2017-01-02 ENCOUNTER — Ambulatory Visit: Payer: BLUE CROSS/BLUE SHIELD | Admitting: Family

## 2017-01-02 NOTE — Telephone Encounter (Signed)
Juan Roberson arrived for his appointment with a nurse aid. He left before being seen. Will attempt to r/s.

## 2017-01-09 ENCOUNTER — Emergency Department: Payer: BLUE CROSS/BLUE SHIELD

## 2017-01-09 ENCOUNTER — Encounter: Payer: Self-pay | Admitting: Emergency Medicine

## 2017-01-09 ENCOUNTER — Inpatient Hospital Stay
Admission: EM | Admit: 2017-01-09 | Discharge: 2017-01-09 | DRG: 683 | Disposition: A | Discharge status: Home / Self Care | Payer: BLUE CROSS/BLUE SHIELD | Attending: Specialist | Admitting: Specialist

## 2017-01-09 DIAGNOSIS — R1084 Generalized abdominal pain: Secondary | ICD-10-CM

## 2017-01-09 DIAGNOSIS — Z882 Allergy status to sulfonamides status: Secondary | ICD-10-CM

## 2017-01-09 DIAGNOSIS — E1121 Type 2 diabetes mellitus with diabetic nephropathy: Secondary | ICD-10-CM | POA: Diagnosis present

## 2017-01-09 DIAGNOSIS — N179 Acute kidney failure, unspecified: Secondary | ICD-10-CM | POA: Diagnosis present

## 2017-01-09 DIAGNOSIS — N184 Chronic kidney disease, stage 4 (severe): Secondary | ICD-10-CM | POA: Diagnosis present

## 2017-01-09 DIAGNOSIS — E0590 Thyrotoxicosis, unspecified without thyrotoxic crisis or storm: Secondary | ICD-10-CM | POA: Diagnosis present

## 2017-01-09 DIAGNOSIS — Z794 Long term (current) use of insulin: Secondary | ICD-10-CM | POA: Diagnosis not present

## 2017-01-09 DIAGNOSIS — D631 Anemia in chronic kidney disease: Secondary | ICD-10-CM | POA: Diagnosis present

## 2017-01-09 DIAGNOSIS — Z803 Family history of malignant neoplasm of breast: Secondary | ICD-10-CM | POA: Diagnosis not present

## 2017-01-09 DIAGNOSIS — I4891 Unspecified atrial fibrillation: Secondary | ICD-10-CM | POA: Diagnosis present

## 2017-01-09 DIAGNOSIS — Z7982 Long term (current) use of aspirin: Secondary | ICD-10-CM | POA: Diagnosis not present

## 2017-01-09 DIAGNOSIS — Z8049 Family history of malignant neoplasm of other genital organs: Secondary | ICD-10-CM

## 2017-01-09 DIAGNOSIS — N183 Chronic kidney disease, stage 3 unspecified: Secondary | ICD-10-CM | POA: Diagnosis present

## 2017-01-09 DIAGNOSIS — K219 Gastro-esophageal reflux disease without esophagitis: Secondary | ICD-10-CM | POA: Diagnosis present

## 2017-01-09 DIAGNOSIS — E1122 Type 2 diabetes mellitus with diabetic chronic kidney disease: Secondary | ICD-10-CM | POA: Diagnosis present

## 2017-01-09 DIAGNOSIS — I5022 Chronic systolic (congestive) heart failure: Secondary | ICD-10-CM | POA: Diagnosis present

## 2017-01-09 DIAGNOSIS — N19 Unspecified kidney failure: Secondary | ICD-10-CM

## 2017-01-09 DIAGNOSIS — J449 Chronic obstructive pulmonary disease, unspecified: Secondary | ICD-10-CM | POA: Diagnosis present

## 2017-01-09 DIAGNOSIS — I252 Old myocardial infarction: Secondary | ICD-10-CM

## 2017-01-09 DIAGNOSIS — N189 Chronic kidney disease, unspecified: Secondary | ICD-10-CM

## 2017-01-09 DIAGNOSIS — Z801 Family history of malignant neoplasm of trachea, bronchus and lung: Secondary | ICD-10-CM

## 2017-01-09 DIAGNOSIS — I130 Hypertensive heart and chronic kidney disease with heart failure and stage 1 through stage 4 chronic kidney disease, or unspecified chronic kidney disease: Secondary | ICD-10-CM | POA: Diagnosis present

## 2017-01-09 DIAGNOSIS — Z8249 Family history of ischemic heart disease and other diseases of the circulatory system: Secondary | ICD-10-CM

## 2017-01-09 DIAGNOSIS — E1169 Type 2 diabetes mellitus with other specified complication: Secondary | ICD-10-CM | POA: Diagnosis present

## 2017-01-09 DIAGNOSIS — I255 Ischemic cardiomyopathy: Secondary | ICD-10-CM | POA: Diagnosis present

## 2017-01-09 DIAGNOSIS — Z800 Family history of malignant neoplasm of digestive organs: Secondary | ICD-10-CM | POA: Diagnosis not present

## 2017-01-09 LAB — URINALYSIS, COMPLETE (UACMP) WITH MICROSCOPIC
BACTERIA UA: NONE SEEN
Bilirubin Urine: NEGATIVE
GLUCOSE, UA: NEGATIVE mg/dL
KETONES UR: NEGATIVE mg/dL
LEUKOCYTES UA: NEGATIVE
NITRITE: NEGATIVE
PROTEIN: 30 mg/dL — AB
SQUAMOUS EPITHELIAL / LPF: NONE SEEN
Specific Gravity, Urine: 1.011 (ref 1.005–1.030)
pH: 5 (ref 5.0–8.0)

## 2017-01-09 LAB — COMPREHENSIVE METABOLIC PANEL
ALBUMIN: 2.9 g/dL — AB (ref 3.5–5.0)
ALT: 10 U/L — ABNORMAL LOW (ref 17–63)
ANION GAP: 12 (ref 5–15)
AST: 17 U/L (ref 15–41)
Alkaline Phosphatase: 71 U/L (ref 38–126)
BILIRUBIN TOTAL: 1 mg/dL (ref 0.3–1.2)
BUN: 138 mg/dL — ABNORMAL HIGH (ref 6–20)
CHLORIDE: 95 mmol/L — AB (ref 101–111)
CO2: 25 mmol/L (ref 22–32)
Calcium: 9.3 mg/dL (ref 8.9–10.3)
Creatinine, Ser: 4.69 mg/dL — ABNORMAL HIGH (ref 0.61–1.24)
GFR calc Af Amer: 16 mL/min — ABNORMAL LOW (ref >60)
GFR calc non Af Amer: 14 mL/min — ABNORMAL LOW (ref >60)
GLUCOSE: 147 mg/dL — AB (ref 65–99)
Potassium: 4.5 mmol/L (ref 3.5–5.1)
SODIUM: 132 mmol/L — AB (ref 135–145)
TOTAL PROTEIN: 8.6 g/dL — AB (ref 6.5–8.1)

## 2017-01-09 LAB — CBC
HCT: 30.3 % — ABNORMAL LOW (ref 40.0–52.0)
HEMOGLOBIN: 9.8 g/dL — AB (ref 13.0–18.0)
MCH: 25.4 pg — ABNORMAL LOW (ref 26.0–34.0)
MCHC: 32.4 g/dL (ref 32.0–36.0)
MCV: 78.2 fL — ABNORMAL LOW (ref 80.0–100.0)
Platelets: 327 10*3/uL (ref 150–440)
RBC: 3.87 MIL/uL — ABNORMAL LOW (ref 4.40–5.90)
RDW: 18.1 % — ABNORMAL HIGH (ref 11.5–14.5)
WBC: 6.4 10*3/uL (ref 3.8–10.6)

## 2017-01-09 LAB — LIPASE, BLOOD: Lipase: 27 U/L (ref 11–51)

## 2017-01-09 MED ORDER — SODIUM CHLORIDE 0.9 % IV BOLUS (SEPSIS)
1000.0000 mL | Freq: Once | INTRAVENOUS | Status: AC
  Administered 2017-01-09: 1000 mL via INTRAVENOUS

## 2017-01-09 MED ORDER — IOPAMIDOL (ISOVUE-300) INJECTION 61%
15.0000 mL | INTRAVENOUS | Status: AC
  Administered 2017-01-09: 15 mL via ORAL

## 2017-01-09 MED ORDER — METRONIDAZOLE IN NACL 5-0.79 MG/ML-% IV SOLN
500.0000 mg | Freq: Once | INTRAVENOUS | Status: AC
  Administered 2017-01-09: 500 mg via INTRAVENOUS
  Filled 2017-01-09: qty 100

## 2017-01-09 MED ORDER — DEXTROSE 5 % IV SOLN: 2.0000 g | INTRAVENOUS | Status: DC

## 2017-01-09 MED ORDER — CEFDINIR 300 MG PO CAPS: 300.0000 mg | ORAL_CAPSULE | Freq: Two times a day (BID) | ORAL | 0 refills | Status: DC

## 2017-01-09 MED ORDER — DEXTROSE 5 % IV SOLN
2.0000 g | Freq: Once | INTRAVENOUS | Status: AC
  Administered 2017-01-09: 2 g via INTRAVENOUS
  Filled 2017-01-09: qty 2

## 2017-01-09 NOTE — Progress Notes (Signed)
[facility]  ROUNDING NOTE   Subjective:   [name] admitted to [facility] on 01/09/2017 for abd pain  Patient found to have BUN of 138 from baseline of 70.   Patient feels he is being overdiuresed with furosemide  bid and spironolactone  daily.   Objective:  Vital signs in last 24 hours:  Temp:  [98.1 F (36.7 C)] 98.1 F (36.7 C) (04/10 0901) Pulse Rate:  [72-77] 73 (04/10 1810) Resp:  [16-20] 20 (04/10 1810) BP: (98-121)/(66-77) 115/73 (04/10 1810) SpO2:  [98 %-100 %] 100 % (04/10 1810) Weight:  [84.4 kg (186 lb)] 84.4 kg (186 lb) (04/10 0902)  Weight change:  Filed Weights   01/09/17 0902  Weight: 84.4 kg (186 lb)    Intake/Output: I/O last 3 completed shifts: In: 1150 [IV Piggyback:1150] Out: -    Intake/Output this shift:  No intake/output data recorded.  Physical Exam: General: NAD, sitting up in bed.   Head: Normocephalic, atraumatic. Moist oral mucosal membranes  Eyes: Anicteric, PERRL  Neck: Supple, trachea midline  Lungs:  Clear to auscultation  Heart: Regular rate and rhythm  Abdomen:  Soft, nontender  Extremities: no peripheral edema.  Neurologic: Nonfocal, moving all four extremities  Skin: No lesions  Access: none    Basic Metabolic Panel:  Recent Labs Lab 01/09/17 0906  NA 132*  K 4.5  CL 95*  CO2 25  GLUCOSE 147*  BUN 138*  CREATININE 4.69*  CALCIUM 9.3    Liver Function Tests:  Recent Labs Lab 01/09/17 0906  AST 17  ALT 10*  ALKPHOS 71  BILITOT 1.0  PROT 8.6*  ALBUMIN 2.9*    Recent Labs Lab 01/09/17 0906  LIPASE 27   No results for input(s): AMMONIA in the last 168 hours.  CBC:  Recent Labs Lab 01/09/17 0906  WBC 6.4  HGB 9.8*  HCT 30.3*  MCV 78.2*  PLT 327    Cardiac Enzymes: No results for input(s): CKTOTAL, CKMB, CKMBINDEX, TROPONINI in the last 168 hours.  BNP: Invalid input(s): POCBNP  CBG: No results for input(s): GLUCAP in the last 168  hours.  Microbiology: Results for orders placed or performed during the hospital encounter of 12/05/16  Body fluid culture     Status: None   Collection Time: 12/06/16  1:45 PM  Result Value Ref Range Status   Specimen Description PERITONEAL  Final   Special Requests NONE  Final   Gram Stain   Final    ABUNDANT WBC PRESENT, PREDOMINANTLY PMN NO ORGANISMS SEEN    Culture   Final    NO GROWTH 3 DAYS Performed at [facility], [address] [place], [place] [address]    Report Status 12/10/2016 FINAL  Final    Coagulation Studies: No results for input(s): LABPROT, INR in the last 72 hours.  Urinalysis:  Recent Labs  01/09/17 0906  COLORURINE YELLOW*  LABSPEC 1.011  PHURINE 5.0  GLUCOSEU NEGATIVE  HGBUR SMALL*  BILIRUBINUR NEGATIVE  KETONESUR NEGATIVE  PROTEINUR 30*  NITRITE NEGATIVE  LEUKOCYTESUR NEGATIVE      Imaging: Ct Abdomen Pelvis Wo Contrast  Result Date: 01/09/2017 CLINICAL DATA:  Mid abdominal pain for 3 days with nausea and bloating. EXAM: CT ABDOMEN AND PELVIS WITHOUT CONTRAST TECHNIQUE: Multidetector CT imaging of the abdomen and pelvis was performed following the standard protocol without IV contrast. COMPARISON:  04/01/2014 FINDINGS: Lower chest: Large right pleural effusion is identified. There is subsegmental atelectasis involving the right middle  lobe and right lower lobe. Hepatobiliary: No focal liver abnormality. No biliary dilatation. The gallbladder is unremarkable. Pancreas: Unremarkable. No pancreatic ductal dilatation or surrounding inflammatory changes. Spleen: Normal in size without focal abnormality. Adrenals/Urinary Tract: The adrenal glands are normal. Unremarkable appearance of the kidneys. Urinary bladder appears normal. Stomach/Bowel: The stomach appears normal. The small bowel loops have a normal course and caliber. No pathologic dilatation of the colon. Vascular/Lymphatic: Aortic atherosclerosis. No enlarged upper abdominal lymph  nodes. No pelvic or inguinal adenopathy. Reproductive: Prostate is unremarkable. Other: Large volume of ascites is identified within the abdomen and pelvis. Musculoskeletal: Degenerative disc disease identified within the lower thoracic and lumbar spine. No aggressive lytic or sclerotic bone lesions. IMPRESSION: 1. Large right pleural effusion. 2. Large volume of ascites identified within the abdomen and pelvis. 3. Aortic atherosclerosis. Electronically Signed   By: Signa Kell M.D.   On: 01/09/2017 13:49     Medications:       Assessment/ Plan:  [name] is a 46 y.o. white male with hypertension, diabetes mellitus type 2, chronic kidney disease stage III, peripheral neuropathy  1. Acute renal failure on Chronic kidney disease stage IV with proteinuria. Baseline creatinine of 3.95, GFR of 19 from 12/19/2016 Acute renal failure is concerning for overdiuresis or acute hepatorenal syndrome.  Chronic kidney disease secondary to diabetic nephropathy.  - hold diuretics.  - monitor volume status.   2. Hypertension. Blood pressure at goal - not currently on a ACE-I/ARB - holding diuretics as above.  - Carvedilol   3. Anemia of chronic kidney disease: hemoglobin 9.9 Has received epo in the past.    LOS: 0 Shilee Biggs 4/10/20187:12 PM

## 2017-01-09 NOTE — ED Provider Notes (Addendum)
[facility] Emergency Department Provider Note  ____________________________________________  Time seen: Approximately 1:00 PM  I have reviewed the triage vital signs and the nursing notes.   HISTORY  Chief Complaint Abdominal Pain    HPI Juan Roberson. is a 46 y.o. male comes to the ED complaining of generalized abdominal pain for the past 2 days. He has a history of cirrhosis and ascites which required hospitalization for spontaneous act throughout peritonitis about one month ago. He reports finishing his outpatient antibiotic course afterward, although he had to be changed from Cipro to another unknown antibiotic due to dysregulation of his serum glucose.[name] He notes that his urine output has still been copious. He is eating and drinking normally, ambulatory. Denies fever or chills. Pain is nonradiating, constant. Moderate intensity.     Past Medical History:  Diagnosis Date  . Cellulitis and abscess of foot    Left-Dr. Ether Griffins  . CHF (congestive heart failure) (HCC)    EF 20%  . CKD (chronic kidney disease), stage III   . Diabetes mellitus without complication (HCC)    a. Dx ~ 1996.  [name] Dysrhythmia   . Essential hypertension   . Gastritis    a. 04/2015 hematemesis -> EGD: gastritis, esophagitis, duodenitis.  No active bleeding.  PPI added.  [name] GERD (gastroesophageal reflux disease)   . Left leg DVT (HCC)    a. Dx 05/2015 -> Coumadin.  . MI (myocardial infarction)   . Osteomyelitis (HCC)    a. 05/2015 L foot.     Patient Active Problem List   Diagnosis Date Noted  . SBP (spontaneous bacterial peritonitis) (HCC) 12/05/2016  . Hyperglycemia 10/28/2016  . Sepsis (HCC) 10/17/2016  . Chest pain 09/24/2016  . Acute-on-chronic kidney injury (HCC) 09/24/2016     Past Surgical History:  Procedure Laterality Date  . CARDIAC CATHETERIZATION Right 10/26/2016   Procedure: Left Heart Cath and Coronary Angiography;  Surgeon: Laurier Nancy, MD;   Location: [facility] INVASIVE CV LAB;  Service: Cardiovascular;  Laterality: Right;  . DIALYSIS/PERMA CATHETER REMOVAL N/A 12/05/2016   Procedure: Dialysis/Perma Catheter Removal;  Surgeon: Renford Dills, MD;  Location: [facility] INVASIVE CV LAB;  Service: Cardiovascular;  Laterality: N/A;  . ESOPHAGOGASTRODUODENOSCOPY N/A 04/07/2015   Procedure: ESOPHAGOGASTRODUODENOSCOPY (EGD);  Surgeon: Midge Minium, MD;  Location: [facility] ENDOSCOPY;  Service: Endoscopy;  Laterality: N/A;  . ESOPHAGOGASTRODUODENOSCOPY (EGD) WITH PROPOFOL Left 06/30/2015   Procedure: ESOPHAGOGASTRODUODENOSCOPY (EGD) WITH PROPOFOL;  Surgeon: Christena Deem, MD;  Location: [facility] ENDOSCOPY;  Service: Endoscopy;  Laterality: Left;  . FOOT SURGERY    . I&D EXTREMITY Left 04/06/2015   Procedure: IRRIGATION AND DEBRIDEMENT EXTREMITY;  Surgeon: Gwyneth Revels, DPM;  Location: [facility] ORS;  Service: Podiatry;  Laterality: Left;  . IRRIGATION AND DEBRIDEMENT FOOT Left 05/21/2015   Procedure: IRRIGATION AND DEBRIDEMENT FOOT;  Surgeon: Linus Galas, MD;  Location: [facility] ORS;  Service: Podiatry;  Laterality: Left;  . IRRIGATION AND DEBRIDEMENT KNEE Right 10/18/2016   Procedure: IRRIGATION AND DEBRIDEMENT KNEE;  Surgeon: Deeann Saint, MD;  Location: [facility] ORS;  Service: Orthopedics;  Laterality: Right;  . KNEE ARTHROSCOPY  10/18/2016   Procedure: ARTHROSCOPY KNEE;  Surgeon: Deeann Saint, MD;  Location: [facility] ORS;  Service: Orthopedics;;  . PERIPHERAL VASCULAR CATHETERIZATION N/A 10/27/2016   Procedure: Dialysis/Perma Catheter Insertion;  Surgeon: Annice Needy, MD;  Location: [facility] INVASIVE CV LAB;  Service: Cardiovascular;  Laterality: N/A;     Prior to Admission medications   Medication Sig Start Date End Date  Taking? Authorizing Provider  amiodarone (PACERONE) 200 MG tablet Take 1 tablet (200 mg total) by mouth daily. 09/25/16  Yes Shaune Pollack, MD  aspirin EC 81 MG EC tablet Take 1 tablet (81 mg total) by mouth daily. 07/23/16  Yes Richard Renae Gloss, MD  BREO ELLIPTA  100-25 MCG/INH AEPB Inhale 1 puff into the lungs daily.  11/09/16  Yes Historical Provider, MD  carvedilol (COREG) 3.125 MG tablet Take 1 tablet (3.125 mg total) by mouth 2 (two) times daily with a meal. 10/27/16  Yes Altamese Dilling, MD  furosemide (LASIX) 80 MG tablet Take 1 tablet (80 mg total) by mouth 2 (two) times daily. 10/31/16  Yes Enid Baas, MD  insulin aspart (NOVOLOG) 100 UNIT/ML injection Inject 4 Units into the skin 3 (three) times daily with meals. 10/31/16  Yes Enid Baas, MD  methimazole (TAPAZOLE) 10 MG tablet Take 1 tablet (10 mg total) by mouth daily. 07/22/16  Yes Richard Renae Gloss, MD  pantoprazole (PROTONIX) 40 MG tablet Take 1 tablet (40 mg total) by mouth daily. 09/25/16  Yes Shaune Pollack, MD  spironolactone (ALDACTONE) 25 MG tablet Take 1 tablet (25 mg total) by mouth daily. 12/09/16  Yes Enid Baas, MD  zolpidem (AMBIEN) 5 MG tablet Take 5 mg by mouth at bedtime as needed for sleep.  11/14/16  Yes Historical Provider, MD  bisacodyl (DULCOLAX) 10 MG suppository Place 1 suppository (10 mg total) rectally daily as needed for moderate constipation. Patient not taking: Reported on 11/29/2016 10/27/16   Altamese Dilling, MD  ciprofloxacin (CIPRO) 500 MG tablet Take 1 tablet (500 mg total) by mouth daily. X 10 more days Patient not taking: Reported on 01/09/2017 12/08/16   Enid Baas, MD  HYDROcodone-acetaminophen (NORCO) 7.5-325 MG tablet Take 1 tablet by mouth every 6 (six) hours as needed for moderate pain (breakthrough pain). Patient not taking: Reported on 11/29/2016 10/31/16   Enid Baas, MD  Nutritional Supplements (FEEDING SUPPLEMENT, NEPRO CARB STEADY,) LIQD Take 237 mLs by mouth 2 (two) times daily between meals. Patient not taking: Reported on 11/29/2016 10/27/16   Altamese Dilling, MD     Allergies Sulfur   Family History  Problem Relation Age of Onset  . Coronary artery disease Father   . Pancreatic cancer Mother   . Breast  cancer Sister   . Lung cancer Brother   . Cervical cancer Sister     Social History Social History  Substance Use Topics  . Smoking status: Never Smoker  . Smokeless tobacco: Never Used  . Alcohol use No    Review of Systems  Constitutional:   No fever or chills.  ENT:   No sore throat. No rhinorrhea. Cardiovascular:   No chest pain. Respiratory:   No dyspnea or cough. Gastrointestinal:   Positive for generalized abdominal pain without vomiting and diarrhea.  Genitourinary:   Negative for dysuria or difficulty urinating. Musculoskeletal:   Negative for focal pain or swelling Neurological:   Negative for headaches 10-point ROS otherwise negative.  ____________________________________________   PHYSICAL EXAM:  VITAL SIGNS: ED Triage Vitals  Enc Vitals Group     BP 01/09/17 0901 114/66     Pulse Rate 01/09/17 0901 77     Resp 01/09/17 0901 16     Temp 01/09/17 0901 98.1 F (36.7 C)     Temp src --      SpO2 01/09/17 0901 98 %     Weight 01/09/17 0902 186 lb (84.4 kg)     Height 01/09/17 0902  (  1.88 m)     Head Circumference --      Peak Flow --      Pain Score 01/09/17 0901 10     Pain Loc --      Pain Edu? --      Excl. in GC? --     Vital signs reviewed, nursing assessments reviewed.   Constitutional:   Alert and oriented. Well appearing and in no distress. Eyes:   No scleral icterus. No conjunctival pallor. PERRL. EOMI.  No nystagmus. ENT   Head:   Normocephalic and atraumatic.   Nose:   No congestion/rhinnorhea. No septal hematoma   Mouth/Throat:   MMM, no pharyngeal erythema. No peritonsillar mass.    Neck:   No stridor. No SubQ emphysema. No meningismus. Hematological/Lymphatic/Immunilogical:   No cervical lymphadenopathy. Cardiovascular:   RRR. Symmetric bilateral radial and DP pulses.  No murmurs.  Respiratory:   Normal respiratory effort without tachypnea nor retractions. Breath sounds are clear and equal bilaterally. No  wheezes/rales/rhonchi. Gastrointestinal:   Soft With left lower quadrant tenderness. Non distended. There is no CVA tenderness.  No rebound, rigidity, or guarding. Point of care ultrasound performed by me shows a small amount of free fluid in the pelvis without an identifiable fluid pocket that would be amenable to Paracentesis. Genitourinary:   deferred Musculoskeletal:   Normal range of motion in all extremities. No joint effusions.  No lower extremity tenderness.  No edema. Neurologic:   Normal speech and language.  CN 2-10 normal. Motor grossly intact. No gross focal neurologic deficits are appreciated.  Skin:    Skin is warm, dry and intact. No rash noted.  No petechiae, purpura, or bullae.  ____________________________________________    LABS (pertinent positives/negatives) (all labs ordered are listed, but only abnormal results are displayed) Labs Reviewed  COMPREHENSIVE METABOLIC PANEL - Abnormal; Notable for the following:       Result Value   Sodium 132 (*)    Chloride 95 (*)    Glucose, Bld 147 (*)    BUN 138 (*)    Creatinine, Ser 4.69 (*)    Total Protein 8.6 (*)    Albumin 2.9 (*)    ALT 10 (*)    GFR calc non Af Amer 14 (*)    GFR calc Af Amer 16 (*)    All other components within normal limits  CBC - Abnormal; Notable for the following:    RBC 3.87 (*)    Hemoglobin 9.8 (*)    HCT 30.3 (*)    MCV 78.2 (*)    MCH 25.4 (*)    RDW 18.1 (*)    All other components within normal limits  URINALYSIS, COMPLETE (UACMP) WITH MICROSCOPIC - Abnormal; Notable for the following:    Color, Urine YELLOW (*)    APPearance CLEAR (*)    Hgb urine dipstick SMALL (*)    Protein, ur 30 (*)    All other components within normal limits  LIPASE, BLOOD   ____________________________________________   EKG    ____________________________________________    RADIOLOGY  Ct Abdomen Pelvis Wo Contrast  Result Date: 01/09/2017 CLINICAL DATA:  Mid abdominal pain for 3 days  with nausea and bloating. EXAM: CT ABDOMEN AND PELVIS WITHOUT CONTRAST TECHNIQUE: Multidetector CT imaging of the abdomen and pelvis was performed following the standard protocol without IV contrast. COMPARISON:  04/01/2014 FINDINGS: Lower chest: Large right pleural effusion is identified. There is subsegmental atelectasis involving the right middle lobe and right lower lobe. Hepatobiliary:  No focal liver abnormality. No biliary dilatation. The gallbladder is unremarkable. Pancreas: Unremarkable. No pancreatic ductal dilatation or surrounding inflammatory changes. Spleen: Normal in size without focal abnormality. Adrenals/Urinary Tract: The adrenal glands are normal. Unremarkable appearance of the kidneys. Urinary bladder appears normal. Stomach/Bowel: The stomach appears normal. The small bowel loops have a normal course and caliber. No pathologic dilatation of the colon. Vascular/Lymphatic: Aortic atherosclerosis. No enlarged upper abdominal lymph nodes. No pelvic or inguinal adenopathy. Reproductive: Prostate is unremarkable. Other: Large volume of ascites is identified within the abdomen and pelvis. Musculoskeletal: Degenerative disc disease identified within the lower thoracic and lumbar spine. No aggressive lytic or sclerotic bone lesions. IMPRESSION: 1. Large right pleural effusion. 2. Large volume of ascites identified within the abdomen and pelvis. 3. Aortic atherosclerosis. Electronically Signed   By: Signa Kell M.D.   On: 01/09/2017 13:49    ____________________________________________   PROCEDURES Procedures  ____________________________________________   INITIAL IMPRESSION / ASSESSMENT AND PLAN / ED COURSE  Pertinent labs & imaging results that were available during my care of the patient were reviewed by me and considered in my medical decision making (see chart for details).  Patient well appearing no acute distress. Vital signs unremarkable. Complains of abdominal pain, found to  have left lower quadrant tenderness on exam. No significant ascites on exam. When he was hospitalized for ascites and SBP one month ago, he weighed 212 pounds.  He was down to 194 pounds when he was discharged from the hospital, and today he weighs 186. No evidence of anasarca or other edema.  Low suspicion for SBP today, we'll get a CT scan to evaluate for diverticulitis. Otherwise, his presentation is pretty reassuring and I would plan to have him on oral antibiotics with outpatient follow-up given his complicated abdominal history.     Clinical Course as of Jan 10 1516  Tue Jan 09, 2017  1405 CT consistent with ascites but without other acute pathology. Reviewing CT images, it again demonstrates lack of large fluid pocket that could be drained from an anterior approach in the ED. Will start abx, discuss with nephro.   [PS]  1423 D/w IR Dr. Bonnielee Haff, who advises that they would also not attempt paracentesis in the absence of a large RLQ collection.   [PS]  1431 D/w Nephro Dr. Wynelle Link. Rec. Admission due to worsened CKD, very high BUN for restarting dialysis. Will cover potential SBP with ceftriaxone, flagyl for now given inability to obtain fluid sample curerntly.   [PS]    Clinical Course User Index [PS] Sharman Cheek, MD     ----------------------------------------- 3:17 PM on 01/09/2017 -----------------------------------------  Discussed with Dr. Cherlynn Kaiser for further management.     ----------------------------------------- 7:03 PM on 01/09/2017 -----------------------------------------  Return to patient bedside due to patient stating he wanted to be discharged home now. He reports that his insurance is ending in 2 days, and he really needs to make it to his ophthalmology appointment tomorrow where he is going to have an expensive procedure. He's been turned down for disability in the past, and cannot afford COBRA insurance with his workplace, so he feels that it is a top priority  to make it to ophthalmology tomorrow while he is still ensured. A long discussion with him about his acute worsening of renal function and concern for intra-abdominal infection. He has medical decision-making capacity. I'll discharge him home on Cefdinir. Return precautions given for any worsening of symptoms or condition, patient understands he can always return to this hospital.  ____________________________________________   FINAL CLINICAL IMPRESSION(S) / ED DIAGNOSES  Final diagnoses:  Acute renal failure superimposed on chronic kidney disease, unspecified CKD stage, unspecified acute renal failure type (HCC)  Uremia  Generalized abdominal pain      New Prescriptions   No medications on file     Portions of this note were generated with dragon dictation software. Dictation errors may occur despite best attempts at proofreading.    Sharman Cheek, MD 01/09/17 1517    Sharman Cheek, MD 01/09/17 [phone]

## 2017-01-09 NOTE — ED Triage Notes (Signed)
Says mid abd pain for 3 days with nausea and bloating.

## 2017-01-09 NOTE — Discharge Instructions (Signed)
Follow up with Dr. Cherylann Ratel as soon as possible for continued monitoring of your kidney function. Take antibiotics as prescribed. Return to the ER if your condition or symptoms worsen.

## 2017-01-09 NOTE — H&P (Signed)
[facility] [place] at [facility]   PATIENT NAME: Juan Roberson    MR#:  [id number]  DATE OF BIRTH:  Jul 10, 1971  DATE OF ADMISSION:  01/09/2017  PRIMARY CARE PHYSICIAN: Corky Downs, MD   REQUESTING/REFERRING PHYSICIAN: Dr. Alfonse Flavors  CHIEF COMPLAINT:   Chief Complaint  Patient presents with  . Abdominal Pain    HISTORY OF PRESENT ILLNESS:  Juan Roberson  is a 46 y.o. male with a known history of Chronic kidney disease stage III, diabetes, hypertension, severe ischemic cardiomyopathy ejection fraction of 10%, showing osteomyelitis, GERD who presents to the hospital due to abdominal pain and noted to be in acute on chronic renal failure. Patient was recently hospitalized last month for volume overload and CHF and diuresis with Lasix drip and also was intermittently placed on dialysis but currently is off of it. He now presents with abdominal pain and his creatinine being slightly elevated from baseline. Patient denies any nausea vomiting fevers chills chest pain shortness of breath weight gain or any other associated symptoms. He is actually below his baseline dry weight. Patient's creatinine in March of this year was 3.7 and now presents with a creatinine of 4.96 with the BUN 138. Hospitalist services were contacted further treatment and evaluation.  PAST MEDICAL HISTORY:   Past Medical History:  Diagnosis Date  . Cellulitis and abscess of foot    Left-Dr. Ether Griffins  . CHF (congestive heart failure) (HCC)    EF 20%  . CKD (chronic kidney disease), stage III   . Diabetes mellitus without complication (HCC)    a. Dx ~ 1996.  [name] Dysrhythmia   . Essential hypertension   . Gastritis    a. 04/2015 hematemesis -> EGD: gastritis, esophagitis, duodenitis.  No active bleeding.  PPI added.  [name] GERD (gastroesophageal reflux disease)   . Left leg DVT (HCC)    a. Dx 05/2015 -> Coumadin.  . MI (myocardial infarction)   . Osteomyelitis (HCC)    a. 05/2015 L foot.    PAST  SURGICAL HISTORY:   Past Surgical History:  Procedure Laterality Date  . CARDIAC CATHETERIZATION Right 10/26/2016   Procedure: Left Heart Cath and Coronary Angiography;  Surgeon: Laurier Nancy, MD;  Location: [facility] INVASIVE CV LAB;  Service: Cardiovascular;  Laterality: Right;  . DIALYSIS/PERMA CATHETER REMOVAL N/A 12/05/2016   Procedure: Dialysis/Perma Catheter Removal;  Surgeon: Renford Dills, MD;  Location: [facility] INVASIVE CV LAB;  Service: Cardiovascular;  Laterality: N/A;  . ESOPHAGOGASTRODUODENOSCOPY N/A 04/07/2015   Procedure: ESOPHAGOGASTRODUODENOSCOPY (EGD);  Surgeon: Midge Minium, MD;  Location: [facility] ENDOSCOPY;  Service: Endoscopy;  Laterality: N/A;  . ESOPHAGOGASTRODUODENOSCOPY (EGD) WITH PROPOFOL Left 06/30/2015   Procedure: ESOPHAGOGASTRODUODENOSCOPY (EGD) WITH PROPOFOL;  Surgeon: Christena Deem, MD;  Location: [facility] ENDOSCOPY;  Service: Endoscopy;  Laterality: Left;  . FOOT SURGERY    . I&D EXTREMITY Left 04/06/2015   Procedure: IRRIGATION AND DEBRIDEMENT EXTREMITY;  Surgeon: Gwyneth Revels, DPM;  Location: [facility] ORS;  Service: Podiatry;  Laterality: Left;  . IRRIGATION AND DEBRIDEMENT FOOT Left 05/21/2015   Procedure: IRRIGATION AND DEBRIDEMENT FOOT;  Surgeon: Linus Galas, MD;  Location: [facility] ORS;  Service: Podiatry;  Laterality: Left;  . IRRIGATION AND DEBRIDEMENT KNEE Right 10/18/2016   Procedure: IRRIGATION AND DEBRIDEMENT KNEE;  Surgeon: Deeann Saint, MD;  Location: [facility] ORS;  Service: Orthopedics;  Laterality: Right;  . KNEE ARTHROSCOPY  10/18/2016   Procedure: ARTHROSCOPY KNEE;  Surgeon: Deeann Saint, MD;  Location: [facility] ORS;  Service: Orthopedics;;  . PERIPHERAL VASCULAR  CATHETERIZATION N/A 10/27/2016   Procedure: Dialysis/Perma Catheter Insertion;  Surgeon: Annice Needy, MD;  Location: [facility] INVASIVE CV LAB;  Service: Cardiovascular;  Laterality: N/A;    SOCIAL HISTORY:   Social History  Substance Use Topics  . Smoking status: Never Smoker  . Smokeless tobacco: Never Used  .  Alcohol use No    FAMILY HISTORY:   Family History  Problem Relation Age of Onset  . Coronary artery disease Father   . Pancreatic cancer Mother   . Breast cancer Sister   . Lung cancer Brother   . Cervical cancer Sister     DRUG ALLERGIES:   Allergies  Allergen Reactions  . Sulfur Other (See Comments)    Pt states that this medication causes renal failure.      REVIEW OF SYSTEMS:   Review of Systems  Constitutional: Negative for fever and weight loss.  HENT: Negative for congestion, nosebleeds and tinnitus.   Eyes: Negative for blurred vision, double vision and redness.  Respiratory: Negative for cough, hemoptysis and shortness of breath.   Cardiovascular: Negative for chest pain, orthopnea, leg swelling and PND.  Gastrointestinal: Positive for abdominal pain. Negative for diarrhea, melena, nausea and vomiting.  Genitourinary: Negative for dysuria, hematuria and urgency.  Musculoskeletal: Negative for falls and joint pain.  Neurological: Negative for dizziness, tingling, sensory change, focal weakness, seizures, weakness and headaches.  Endo/Heme/Allergies: Negative for polydipsia. Does not bruise/bleed easily.  Psychiatric/Behavioral: Negative for depression and memory loss. The patient is not nervous/anxious.     MEDICATIONS AT HOME:   Prior to Admission medications   Medication Sig Start Date End Date Taking? Authorizing Provider  amiodarone (PACERONE) 200 MG tablet Take 1 tablet (200 mg total) by mouth daily. 09/25/16  Yes Shaune Pollack, MD  aspirin EC 81 MG EC tablet Take 1 tablet (81 mg total) by mouth daily. 07/23/16  Yes Richard Renae Gloss, MD  BREO ELLIPTA 100-25 MCG/INH AEPB Inhale 1 puff into the lungs daily.  11/09/16  Yes Historical Provider, MD  carvedilol (COREG) 3.125 MG tablet Take 1 tablet (3.125 mg total) by mouth 2 (two) times daily with a meal. 10/27/16  Yes Altamese Dilling, MD  furosemide (LASIX) 80 MG tablet Take 1 tablet (80 mg total) by mouth 2  (two) times daily. 10/31/16  Yes Enid Baas, MD  insulin aspart (NOVOLOG) 100 UNIT/ML injection Inject 4 Units into the skin 3 (three) times daily with meals. 10/31/16  Yes Enid Baas, MD  methimazole (TAPAZOLE) 10 MG tablet Take 1 tablet (10 mg total) by mouth daily. 07/22/16  Yes Richard Renae Gloss, MD  pantoprazole (PROTONIX) 40 MG tablet Take 1 tablet (40 mg total) by mouth daily. 09/25/16  Yes Shaune Pollack, MD  spironolactone (ALDACTONE) 25 MG tablet Take 1 tablet (25 mg total) by mouth daily. 12/09/16  Yes Enid Baas, MD  zolpidem (AMBIEN) 5 MG tablet Take 5 mg by mouth at bedtime as needed for sleep.  11/14/16  Yes Historical Provider, MD  bisacodyl (DULCOLAX) 10 MG suppository Place 1 suppository (10 mg total) rectally daily as needed for moderate constipation. Patient not taking: Reported on 11/29/2016 10/27/16   Altamese Dilling, MD  ciprofloxacin (CIPRO) 500 MG tablet Take 1 tablet (500 mg total) by mouth daily. X 10 more days Patient not taking: Reported on 01/09/2017 12/08/16   Enid Baas, MD  HYDROcodone-acetaminophen (NORCO) 7.5-325 MG tablet Take 1 tablet by mouth every 6 (six) hours as needed for moderate pain (breakthrough pain). Patient not taking: Reported on 11/29/2016  10/31/16   Enid Baas, MD  Nutritional Supplements (FEEDING SUPPLEMENT, NEPRO CARB STEADY,) LIQD Take 237 mLs by mouth 2 (two) times daily between meals. Patient not taking: Reported on 11/29/2016 10/27/16   Altamese Dilling, MD      VITAL SIGNS:  Blood pressure 121/77, pulse 72, temperature 98.1 F (36.7 C), resp. rate 17, height  (1.88 m), weight 84.4 kg (186 lb), SpO2 99 %.  PHYSICAL EXAMINATION:  Physical Exam  GENERAL:  46 y.o.-year-old patient lying in bed in no acute distress.  EYES: Pupils equal, round, reactive to light and accommodation. No scleral icterus. Extraocular muscles intact.  HEENT: Head atraumatic, normocephalic. Oropharynx and nasopharynx clear. No  oropharyngeal erythema, moist oral mucosa  NECK:  Supple, no jugular venous distention. No thyroid enlargement, no tenderness.  LUNGS: Normal breath sounds bilaterally, no wheezing, rales, rhonchi. No use of accessory muscles of respiration.  CARDIOVASCULAR: S1, S2 RRR. No murmurs, rubs, gallops, clicks.  ABDOMEN: Soft, nontender, distended. Bowel sounds present. No organomegaly or mass.  EXTREMITIES: No pedal edema, cyanosis, or clubbing. + 2 pedal & radial pulses b/l.   NEUROLOGIC: Cranial nerves II through XII are intact. No focal Motor or sensory deficits appreciated b/l. Globally weak. PSYCHIATRIC: The patient is alert and oriented x 3. SKIN: No obvious rash, lesion, or ulcer.   LABORATORY PANEL:   CBC  Recent Labs Lab 01/09/17 0906  WBC 6.4  HGB 9.8*  HCT 30.3*  PLT 327   ------------------------------------------------------------------------------------------------------------------  Chemistries   Recent Labs Lab 01/09/17 0906  NA 132*  K 4.5  CL 95*  CO2 25  GLUCOSE 147*  BUN 138*  CREATININE 4.69*  CALCIUM 9.3  AST 17  ALT 10*  ALKPHOS 71  BILITOT 1.0   ------------------------------------------------------------------------------------------------------------------  Cardiac Enzymes No results for input(s): TROPONINI in the last 168 hours. ------------------------------------------------------------------------------------------------------------------  RADIOLOGY:  Ct Abdomen Pelvis Wo Contrast  Result Date: 01/09/2017 CLINICAL DATA:  Mid abdominal pain for 3 days with nausea and bloating. EXAM: CT ABDOMEN AND PELVIS WITHOUT CONTRAST TECHNIQUE: Multidetector CT imaging of the abdomen and pelvis was performed following the standard protocol without IV contrast. COMPARISON:  04/01/2014 FINDINGS: Lower chest: Large right pleural effusion is identified. There is subsegmental atelectasis involving the right middle lobe and right lower lobe. Hepatobiliary: No  focal liver abnormality. No biliary dilatation. The gallbladder is unremarkable. Pancreas: Unremarkable. No pancreatic ductal dilatation or surrounding inflammatory changes. Spleen: Normal in size without focal abnormality. Adrenals/Urinary Tract: The adrenal glands are normal. Unremarkable appearance of the kidneys. Urinary bladder appears normal. Stomach/Bowel: The stomach appears normal. The small bowel loops have a normal course and caliber. No pathologic dilatation of the colon. Vascular/Lymphatic: Aortic atherosclerosis. No enlarged upper abdominal lymph nodes. No pelvic or inguinal adenopathy. Reproductive: Prostate is unremarkable. Other: Large volume of ascites is identified within the abdomen and pelvis. Musculoskeletal: Degenerative disc disease identified within the lower thoracic and lumbar spine. No aggressive lytic or sclerotic bone lesions. IMPRESSION: 1. Large right pleural effusion. 2. Large volume of ascites identified within the abdomen and pelvis. 3. Aortic atherosclerosis. Electronically Signed   By: Signa Kell M.D.   On: 01/09/2017 13:49     IMPRESSION AND PLAN:   46 year old male with past medical history of chronic kidney disease stage IV, hypertension, hyperlipidemia, severe ischemic cardiomyopathy ejection fraction of 10%, chronic systolic CHF, GERD, who presents to the hospital due to abdominal pain and noted to be in acute on chronic renal failure.   1. Abdominal pain-etiology unclear.  Patient does have a previous history of SBP. His CT scan does show a large volume ascites. -I will get a ultrasound with paracentesis for diagnostic purposes to rule out SBP. Patient has been empirically given IV ceftriaxone in the ER. Follow clinically.  2. Acute on chronic renal failure-patient's BUN and creatinine is elevated from baseline. In March patient's creatinine was 3.7 currently elevated at 4.6 and BUN up to 138. -I will get a nephrology consult. Patient has been on dialysis in  the past. Hold Lasix and Aldactone for now. -he does not appear to be volume overloaded.  3. History of atrial fibrillation-rate controlled. Continue amiodarone.  4. COPD-no acute exacerbation-continue Brio-Ellipta  5. GERD - cont. Protonix.   6. Hyperthyroidism - cont. Tapazole.    7. DM - cont. Novolog w/ meals.  - Carb control diet. Follow BS.     All the records are reviewed and case discussed with ED provider. Management plans discussed with the patient, family and they are in agreement.  CODE STATUS: full code  TOTAL TIME TAKING CARE OF THIS PATIENT: 45 minutes.    Houston Siren M.D on 01/09/2017 at 4:13 PM  Between 7am to 6pm - Pager - [phone]  After 6pm go to [email or web address] - password [email or web address] [facility]  [facility]  Office  [phone]  CC: Primary care physician; Corky Downs, MD

## 2017-01-09 NOTE — ED Notes (Signed)
Pt c/o abd bloating with discomfort for the past week, states he has had to have fluid drained from his abdomen about every 3 weeks lately, states last time they drew off 16lb of fluid.[name]

## 2017-01-09 NOTE — ED Notes (Signed)
CT notified that pt is finished with contrast.  

## 2017-01-11 ENCOUNTER — Encounter: Payer: Self-pay | Admitting: Nephrology

## 2017-01-11 ENCOUNTER — Inpatient Hospital Stay: Payer: BLUE CROSS/BLUE SHIELD | Attending: Oncology | Admitting: Oncology

## 2017-01-14 ENCOUNTER — Inpatient Hospital Stay
Admission: EM | Admit: 2017-01-14 | Discharge: 2017-01-19 | DRG: 291 | Disposition: A | Discharge status: Home / Self Care | Payer: BLUE CROSS/BLUE SHIELD | Attending: Internal Medicine | Admitting: Internal Medicine

## 2017-01-14 ENCOUNTER — Encounter: Payer: Self-pay | Admitting: Emergency Medicine

## 2017-01-14 DIAGNOSIS — N189 Chronic kidney disease, unspecified: Secondary | ICD-10-CM

## 2017-01-14 DIAGNOSIS — K652 Spontaneous bacterial peritonitis: Secondary | ICD-10-CM | POA: Diagnosis present

## 2017-01-14 DIAGNOSIS — I509 Heart failure, unspecified: Secondary | ICD-10-CM | POA: Diagnosis not present

## 2017-01-14 DIAGNOSIS — N179 Acute kidney failure, unspecified: Secondary | ICD-10-CM | POA: Diagnosis present

## 2017-01-14 DIAGNOSIS — E871 Hypo-osmolality and hyponatremia: Secondary | ICD-10-CM | POA: Diagnosis present

## 2017-01-14 DIAGNOSIS — J449 Chronic obstructive pulmonary disease, unspecified: Secondary | ICD-10-CM | POA: Diagnosis present

## 2017-01-14 DIAGNOSIS — E43 Unspecified severe protein-calorie malnutrition: Secondary | ICD-10-CM | POA: Diagnosis present

## 2017-01-14 DIAGNOSIS — Z992 Dependence on renal dialysis: Secondary | ICD-10-CM

## 2017-01-14 DIAGNOSIS — I252 Old myocardial infarction: Secondary | ICD-10-CM

## 2017-01-14 DIAGNOSIS — I5023 Acute on chronic systolic (congestive) heart failure: Secondary | ICD-10-CM | POA: Diagnosis present

## 2017-01-14 DIAGNOSIS — Z86718 Personal history of other venous thrombosis and embolism: Secondary | ICD-10-CM | POA: Diagnosis not present

## 2017-01-14 DIAGNOSIS — K219 Gastro-esophageal reflux disease without esophagitis: Secondary | ICD-10-CM | POA: Diagnosis present

## 2017-01-14 DIAGNOSIS — E1122 Type 2 diabetes mellitus with diabetic chronic kidney disease: Secondary | ICD-10-CM | POA: Diagnosis present

## 2017-01-14 DIAGNOSIS — Z79899 Other long term (current) drug therapy: Secondary | ICD-10-CM

## 2017-01-14 DIAGNOSIS — I482 Chronic atrial fibrillation: Secondary | ICD-10-CM | POA: Diagnosis present

## 2017-01-14 DIAGNOSIS — Z8049 Family history of malignant neoplasm of other genital organs: Secondary | ICD-10-CM

## 2017-01-14 DIAGNOSIS — E0590 Thyrotoxicosis, unspecified without thyrotoxic crisis or storm: Secondary | ICD-10-CM | POA: Diagnosis present

## 2017-01-14 DIAGNOSIS — I2510 Atherosclerotic heart disease of native coronary artery without angina pectoris: Secondary | ICD-10-CM | POA: Diagnosis present

## 2017-01-14 DIAGNOSIS — E1142 Type 2 diabetes mellitus with diabetic polyneuropathy: Secondary | ICD-10-CM | POA: Diagnosis present

## 2017-01-14 DIAGNOSIS — N183 Chronic kidney disease, stage 3 unspecified: Secondary | ICD-10-CM | POA: Diagnosis present

## 2017-01-14 DIAGNOSIS — Z800 Family history of malignant neoplasm of digestive organs: Secondary | ICD-10-CM

## 2017-01-14 DIAGNOSIS — R112 Nausea with vomiting, unspecified: Secondary | ICD-10-CM | POA: Diagnosis not present

## 2017-01-14 DIAGNOSIS — R1084 Generalized abdominal pain: Secondary | ICD-10-CM

## 2017-01-14 DIAGNOSIS — E11649 Type 2 diabetes mellitus with hypoglycemia without coma: Secondary | ICD-10-CM | POA: Diagnosis present

## 2017-01-14 DIAGNOSIS — N186 End stage renal disease: Secondary | ICD-10-CM | POA: Diagnosis present

## 2017-01-14 DIAGNOSIS — Z882 Allergy status to sulfonamides status: Secondary | ICD-10-CM

## 2017-01-14 DIAGNOSIS — Z801 Family history of malignant neoplasm of trachea, bronchus and lung: Secondary | ICD-10-CM

## 2017-01-14 DIAGNOSIS — R188 Other ascites: Secondary | ICD-10-CM | POA: Diagnosis present

## 2017-01-14 DIAGNOSIS — R0602 Shortness of breath: Secondary | ICD-10-CM

## 2017-01-14 DIAGNOSIS — Z803 Family history of malignant neoplasm of breast: Secondary | ICD-10-CM

## 2017-01-14 DIAGNOSIS — I255 Ischemic cardiomyopathy: Secondary | ICD-10-CM | POA: Diagnosis present

## 2017-01-14 DIAGNOSIS — Z7982 Long term (current) use of aspirin: Secondary | ICD-10-CM

## 2017-01-14 DIAGNOSIS — Z8249 Family history of ischemic heart disease and other diseases of the circulatory system: Secondary | ICD-10-CM

## 2017-01-14 DIAGNOSIS — I132 Hypertensive heart and chronic kidney disease with heart failure and with stage 5 chronic kidney disease, or end stage renal disease: Principal | ICD-10-CM | POA: Diagnosis present

## 2017-01-14 DIAGNOSIS — E785 Hyperlipidemia, unspecified: Secondary | ICD-10-CM | POA: Diagnosis present

## 2017-01-14 DIAGNOSIS — D631 Anemia in chronic kidney disease: Secondary | ICD-10-CM | POA: Diagnosis present

## 2017-01-14 DIAGNOSIS — R109 Unspecified abdominal pain: Secondary | ICD-10-CM | POA: Diagnosis not present

## 2017-01-14 LAB — COMPREHENSIVE METABOLIC PANEL
ALT: 10 U/L — ABNORMAL LOW (ref 17–63)
ANION GAP: 11 (ref 5–15)
AST: 15 U/L (ref 15–41)
Albumin: 2.8 g/dL — ABNORMAL LOW (ref 3.5–5.0)
Alkaline Phosphatase: 66 U/L (ref 38–126)
BILIRUBIN TOTAL: 0.9 mg/dL (ref 0.3–1.2)
BUN: 139 mg/dL — ABNORMAL HIGH (ref 6–20)
CHLORIDE: 96 mmol/L — AB (ref 101–111)
CO2: 24 mmol/L (ref 22–32)
Calcium: 9.3 mg/dL (ref 8.9–10.3)
Creatinine, Ser: 4.72 mg/dL — ABNORMAL HIGH (ref 0.61–1.24)
GFR, EST AFRICAN AMERICAN: 16 mL/min — AB (ref >60)
GFR, EST NON AFRICAN AMERICAN: 13 mL/min — AB (ref >60)
Glucose, Bld: 139 mg/dL — ABNORMAL HIGH (ref 65–99)
Potassium: 4.2 mmol/L (ref 3.5–5.1)
Sodium: 131 mmol/L — ABNORMAL LOW (ref 135–145)
TOTAL PROTEIN: 8.4 g/dL — AB (ref 6.5–8.1)

## 2017-01-14 LAB — URINALYSIS, COMPLETE (UACMP) WITH MICROSCOPIC
Bacteria, UA: NONE SEEN
Bilirubin Urine: NEGATIVE
GLUCOSE, UA: NEGATIVE mg/dL
HGB URINE DIPSTICK: NEGATIVE
KETONES UR: NEGATIVE mg/dL
LEUKOCYTES UA: NEGATIVE
NITRITE: NEGATIVE
PH: 5 (ref 5.0–8.0)
Protein, ur: NEGATIVE mg/dL
Specific Gravity, Urine: 1.011 (ref 1.005–1.030)

## 2017-01-14 LAB — CBC
HEMATOCRIT: 28.6 % — AB (ref 40.0–52.0)
Hemoglobin: 9 g/dL — ABNORMAL LOW (ref 13.0–18.0)
MCH: 25.3 pg — ABNORMAL LOW (ref 26.0–34.0)
MCHC: 31.6 g/dL — ABNORMAL LOW (ref 32.0–36.0)
MCV: 80.2 fL (ref 80.0–100.0)
Platelets: 332 10*3/uL (ref 150–440)
RBC: 3.57 MIL/uL — AB (ref 4.40–5.90)
RDW: 17.9 % — ABNORMAL HIGH (ref 11.5–14.5)
WBC: 6.2 10*3/uL (ref 3.8–10.6)

## 2017-01-14 LAB — LIPASE, BLOOD: LIPASE: 23 U/L (ref 11–51)

## 2017-01-14 LAB — GLUCOSE, CAPILLARY: Glucose-Capillary: 185 mg/dL — ABNORMAL HIGH (ref 65–99)

## 2017-01-14 MED ORDER — SPIRONOLACTONE 25 MG PO TABS
25.0000 mg | ORAL_TABLET | Freq: Every day | ORAL | Status: DC
  Administered 2017-01-14: 25 mg via ORAL
  Filled 2017-01-14: qty 1

## 2017-01-14 MED ORDER — CARVEDILOL 6.25 MG PO TABS
3.1250 mg | ORAL_TABLET | Freq: Two times a day (BID) | ORAL | Status: DC
  Administered 2017-01-14 – 2017-01-19 (×7): 3.125 mg via ORAL
  Filled 2017-01-14 (×9): qty 1

## 2017-01-14 MED ORDER — INSULIN ASPART 100 UNIT/ML ~~LOC~~ SOLN: 0.0000 [IU] | Freq: Every day | SUBCUTANEOUS | Status: DC

## 2017-01-14 MED ORDER — ONDANSETRON HCL 4 MG/2ML IJ SOLN
4.0000 mg | Freq: Four times a day (QID) | INTRAMUSCULAR | Status: DC | PRN
  Administered 2017-01-15: 4 mg via INTRAVENOUS
  Filled 2017-01-14 (×2): qty 2

## 2017-01-14 MED ORDER — AMIODARONE HCL 200 MG PO TABS
200.0000 mg | ORAL_TABLET | Freq: Every day | ORAL | Status: DC
  Administered 2017-01-14 – 2017-01-19 (×5): 200 mg via ORAL
  Filled 2017-01-14 (×5): qty 1

## 2017-01-14 MED ORDER — FLUTICASONE FUROATE-VILANTEROL 100-25 MCG/INH IN AEPB
1.0000 | INHALATION_SPRAY | Freq: Every day | RESPIRATORY_TRACT | Status: DC
  Administered 2017-01-14 – 2017-01-19 (×6): 1 via RESPIRATORY_TRACT
  Filled 2017-01-14: qty 28

## 2017-01-14 MED ORDER — INSULIN ASPART 100 UNIT/ML ~~LOC~~ SOLN
4.0000 [IU] | Freq: Three times a day (TID) | SUBCUTANEOUS | Status: DC
  Administered 2017-01-15 – 2017-01-19 (×9): 4 [IU] via SUBCUTANEOUS
  Filled 2017-01-14 (×11): qty 4

## 2017-01-14 MED ORDER — ONDANSETRON HCL 4 MG PO TABS: 4.0000 mg | ORAL_TABLET | Freq: Four times a day (QID) | ORAL | Status: DC | PRN

## 2017-01-14 MED ORDER — METHIMAZOLE 10 MG PO TABS
10.0000 mg | ORAL_TABLET | Freq: Every day | ORAL | Status: DC
  Administered 2017-01-14 – 2017-01-19 (×6): 10 mg via ORAL
  Filled 2017-01-14 (×6): qty 1

## 2017-01-14 MED ORDER — VANCOMYCIN HCL IN DEXTROSE 1-5 GM/200ML-% IV SOLN
1000.0000 mg | Freq: Once | INTRAVENOUS | Status: AC
  Administered 2017-01-14: 1000 mg via INTRAVENOUS
  Filled 2017-01-14: qty 200

## 2017-01-14 MED ORDER — BISACODYL 10 MG RE SUPP: 10.0000 mg | Freq: Every day | RECTAL | Status: DC | PRN

## 2017-01-14 MED ORDER — SODIUM CHLORIDE 0.9% FLUSH
3.0000 mL | Freq: Two times a day (BID) | INTRAVENOUS | Status: DC
  Administered 2017-01-14 – 2017-01-19 (×11): 3 mL via INTRAVENOUS

## 2017-01-14 MED ORDER — ACETAMINOPHEN 325 MG PO TABS: 650.0000 mg | ORAL_TABLET | Freq: Four times a day (QID) | ORAL | Status: DC | PRN

## 2017-01-14 MED ORDER — MORPHINE SULFATE (PF) 2 MG/ML IV SOLN
1.0000 mg | Freq: Four times a day (QID) | INTRAVENOUS | Status: DC | PRN
  Administered 2017-01-14 – 2017-01-18 (×8): 1 mg via INTRAVENOUS
  Filled 2017-01-14 (×9): qty 1

## 2017-01-14 MED ORDER — FUROSEMIDE 10 MG/ML IJ SOLN: 60.0000 mg | Freq: Two times a day (BID) | INTRAMUSCULAR | Status: DC

## 2017-01-14 MED ORDER — DEXTROSE 5 % IV SOLN
2.0000 g | INTRAVENOUS | Status: DC
  Administered 2017-01-14 – 2017-01-18 (×5): 2 g via INTRAVENOUS
  Filled 2017-01-14 (×5): qty 2

## 2017-01-14 MED ORDER — NEPRO/CARBSTEADY PO LIQD
237.0000 mL | Freq: Two times a day (BID) | ORAL | Status: DC
  Administered 2017-01-14 – 2017-01-18 (×6): 237 mL via ORAL

## 2017-01-14 MED ORDER — ZOLPIDEM TARTRATE 5 MG PO TABS: 5.0000 mg | ORAL_TABLET | Freq: Every evening | ORAL | Status: DC | PRN

## 2017-01-14 MED ORDER — ACETAMINOPHEN 650 MG RE SUPP: 650.0000 mg | Freq: Four times a day (QID) | RECTAL | Status: DC | PRN

## 2017-01-14 MED ORDER — INSULIN ASPART 100 UNIT/ML ~~LOC~~ SOLN
0.0000 [IU] | Freq: Three times a day (TID) | SUBCUTANEOUS | Status: DC
  Administered 2017-01-15 – 2017-01-18 (×9): 2 [IU] via SUBCUTANEOUS
  Administered 2017-01-18: 3 [IU] via SUBCUTANEOUS
  Administered 2017-01-19: 2 [IU] via SUBCUTANEOUS
  Administered 2017-01-19: 1 [IU] via SUBCUTANEOUS
  Filled 2017-01-14 (×2): qty 2
  Filled 2017-01-14: qty 3
  Filled 2017-01-14 (×5): qty 2
  Filled 2017-01-14: qty 1
  Filled 2017-01-14 (×3): qty 2

## 2017-01-14 MED ORDER — CEFUROXIME AXETIL 500 MG PO TABS
500.0000 mg | ORAL_TABLET | Freq: Every day | ORAL | Status: DC
  Administered 2017-01-14: 500 mg via ORAL
  Filled 2017-01-14: qty 1

## 2017-01-14 MED ORDER — PANTOPRAZOLE SODIUM 40 MG PO TBEC
40.0000 mg | DELAYED_RELEASE_TABLET | Freq: Every day | ORAL | Status: DC
  Administered 2017-01-14 – 2017-01-19 (×6): 40 mg via ORAL
  Filled 2017-01-14 (×6): qty 1

## 2017-01-14 MED ORDER — TRAMADOL HCL 50 MG PO TABS
50.0000 mg | ORAL_TABLET | Freq: Two times a day (BID) | ORAL | Status: DC | PRN
  Administered 2017-01-14: 50 mg via ORAL
  Filled 2017-01-14: qty 1

## 2017-01-14 NOTE — Progress Notes (Signed)
[facility] - [place] at [facility]   PATIENT NAME: Juan Roberson    MR#:  [id number]  DATE OF BIRTH:  November 13, 1970  SUBJECTIVE:  Patient was seen here in the emergency room with similar symptoms of abdominal pain and abdominal distention and he left the ER since he had eye appointment the next day. Comes in with abdominal pain and abdominal distention and admitted with elevated creatinine of 4.72. Patient complains of pain are on the umbilical area REVIEW OF SYSTEMS:   Review of Systems  Constitutional: Negative for chills, fever and weight loss.  HENT: Negative for ear discharge, ear pain and nosebleeds.   Eyes: Negative for blurred vision, pain and discharge.  Respiratory: Positive for shortness of breath. Negative for sputum production, wheezing and stridor.   Cardiovascular: Positive for chest pain. Negative for palpitations, orthopnea and PND.  Gastrointestinal: Positive for abdominal pain and nausea. Negative for diarrhea and vomiting.  Genitourinary: Negative for frequency and urgency.  Musculoskeletal: Negative for back pain and joint pain.  Neurological: Positive for weakness. Negative for sensory change, speech change and focal weakness.  Psychiatric/Behavioral: Negative for depression and hallucinations. The patient is not nervous/anxious.    Tolerating Diet:some Tolerating PT: pending  DRUG ALLERGIES:   Allergies  Allergen Reactions  . Sulfur Other (See Comments)    Pt states that this medication causes renal failure.      VITALS:  Blood pressure 121/72, pulse 67, temperature 97.6 F (36.4 C), temperature source Oral, resp. rate 20, height  (1.88 m), weight 84.4 kg (186 lb), SpO2 97 %.  PHYSICAL EXAMINATION:   Physical Exam  GENERAL:  47 y.o.-year-old patient lying in the bed with no acute distress. Appears chronically ill EYES: Pupils equal, round, reactive to light and accommodation. No scleral icterus. Extraocular muscles intact.   HEENT: Head atraumatic, normocephalic. Oropharynx and nasopharynx clear.  NECK:  Supple, no jugular venous distention. No thyroid enlargement, no tenderness.  LUNGS: Decreased breath sounds bilaterally, no wheezing, rales, rhonchi. No use of accessory muscles of respiration.  CARDIOVASCULAR: S1, S2 normal. No murmurs, rubs, or gallops.  ABDOMEN: Soft, nontender, distended, ascites. Bowel sounds present. No organomegaly or mass.  EXTREMITIES: No cyanosis, clubbing or edema b/l.    NEUROLOGIC: Cranial nerves II through XII are intact. No focal Motor or sensory deficits b/l.   PSYCHIATRIC:  patient is alert and oriented x 3.  SKIN: No obvious rash, lesion, or ulcer.   LABORATORY PANEL:  CBC  Recent Labs Lab 01/14/17 0203  WBC 6.2  HGB 9.0*  HCT 28.6*  PLT 332    Chemistries   Recent Labs Lab 01/14/17 0203  NA 131*  K 4.2  CL 96*  CO2 24  GLUCOSE 139*  BUN 139*  CREATININE 4.72*  CALCIUM 9.3  AST 15  ALT 10*  ALKPHOS 66  BILITOT 0.9   Cardiac Enzymes No results for input(s): TROPONINI in the last 168 hours. RADIOLOGY:  No results found. ASSESSMENT AND PLAN:  46 year old male with past medical history of chronic kidney disease stage IV, hypertension, hyperlipidemia, severe ischemic cardiomyopathy ejection fraction of 10%, chronic systolic CHF, GERD, who presents to the hospital due to abdominal pain and noted to be in acute on chronic renal failure.  1. Abdominal pain-etiology unclear. Patient does have a previous history of SBP. His CT scan does show a large volume ascites. - will get a ultrasound with paracentesis for diagnostic and therapeutic purposes to rule out SBP. -Continue IV ceftriaxone  -  Follow clinically.  2. Acute on chronic renal failure-patient's BUN and creatinine is elevated from baseline. In March patient's creatinine was 3.7 currently elevated at 4.72 and BUN up to 138. - will get a nephrology consult. Patient has been on dialysis in the past.  Hold Lasix and Aldactone for now. -he does not appear to be volume overloaded.  3. History of atrial fibrillation-rate controlled. Continue amiodarone.  4. COPD-no acute exacerbation-continue Brio-Ellipta  5. GERD - cont. Protonix.   6. Hyperthyroidism - cont. Tapazole.    7. DM - cont. Novolog w/ meals.  - Carb control diet. Follow BS.   Spoke with Dr. Wynelle Link  Case discussed with Care Management/Social Worker. Management plans discussed with the patient, family and they are in agreement.  CODE STATUS: full  DVT Prophylaxis: lovneox  TOTAL TIME TAKING CARE OF THIS PATIENT: 30 minutes.  >50% time spent on counselling and coordination of care  POSSIBLE D/C IN 1-2  DAYS, DEPENDING ON CLINICAL CONDITION.  Note: This dictation was prepared with Dragon dictation along with smaller phrase technology. Any transcriptional errors that result from this process are unintentional.  Adriannah Steinkamp M.D on 01/14/2017 at 12:20 PM  Between 7am to 6pm - Pager - [phone]  After 6pm go to [email or web address] - Social research officer, government  [facility]  Office  [phone]  CC: Primary care physician; Corky Downs, MD

## 2017-01-14 NOTE — ED Provider Notes (Signed)
[facility] Emergency Department Provider Note  ____________________________________________   First MD Initiated Contact with Patient 01/14/17 [phone]     (approximate)  I have reviewed the triage vital signs and the nursing notes.   HISTORY  Chief Complaint Abdominal Pain and Nausea    HPI Juan Roberson. is a 46 y.o. male With an extensive chronic medical history and admission about a month ago for spontaneous bacterial peritonitis who presents for evaluation of persistent generalized abdominal pain and swelling.  He was seen several days ago for the same symptoms and found that his creatinine is steadily worsening, going from a baseline around 3 to about 4.5.  Additionally he has the tenderness to palpation of the abdomen that seems recurrent and for which she has been treated previously for possible SBP although cultures have been negative.  He was recommended for admission at that time but he declined due to insurance issues and an outpatient ophthalmology appointment he did not want to miss.  he says that in the 3 days since he was last seen his abdominal pain has gotten worse and the cefdinir has not helped.  movement makes it worse and nothing in particular makes it better.  He denies fever/chills, chest pain, shortness of breath. he is still urinating and denies dysuria.   Past Medical History:  Diagnosis Date  . Cellulitis and abscess of foot    Left-Dr. Ether Griffins  . CHF (congestive heart failure) (HCC)    EF 20%  . CKD (chronic kidney disease), stage III   . Diabetes mellitus without complication (HCC)    a. Dx ~ 1996.  [name] Dysrhythmia   . Essential hypertension   . Gastritis    a. 04/2015 hematemesis -> EGD: gastritis, esophagitis, duodenitis.  No active bleeding.  PPI added.  [name] GERD (gastroesophageal reflux disease)   . Left leg DVT (HCC)    a. Dx 05/2015 -> Coumadin.  . MI (myocardial infarction) (HCC)   . Osteomyelitis (HCC)    a. 05/2015 L  foot.    Patient Active Problem List   Diagnosis Date Noted  . Acute on chronic renal failure (HCC) 01/09/2017  . SBP (spontaneous bacterial peritonitis) (HCC) 12/05/2016  . Hyperglycemia 10/28/2016  . Sepsis (HCC) 10/17/2016  . Chest pain 09/24/2016  . Acute-on-chronic kidney injury (HCC) 09/24/2016    Past Surgical History:  Procedure Laterality Date  . CARDIAC CATHETERIZATION Right 10/26/2016   Procedure: Left Heart Cath and Coronary Angiography;  Surgeon: Laurier Nancy, MD;  Location: [facility] INVASIVE CV LAB;  Service: Cardiovascular;  Laterality: Right;  . DIALYSIS/PERMA CATHETER REMOVAL N/A 12/05/2016   Procedure: Dialysis/Perma Catheter Removal;  Surgeon: Renford Dills, MD;  Location: [facility] INVASIVE CV LAB;  Service: Cardiovascular;  Laterality: N/A;  . ESOPHAGOGASTRODUODENOSCOPY N/A 04/07/2015   Procedure: ESOPHAGOGASTRODUODENOSCOPY (EGD);  Surgeon: Midge Minium, MD;  Location: [facility] ENDOSCOPY;  Service: Endoscopy;  Laterality: N/A;  . ESOPHAGOGASTRODUODENOSCOPY (EGD) WITH PROPOFOL Left 06/30/2015   Procedure: ESOPHAGOGASTRODUODENOSCOPY (EGD) WITH PROPOFOL;  Surgeon: Christena Deem, MD;  Location: [facility] ENDOSCOPY;  Service: Endoscopy;  Laterality: Left;  . FOOT SURGERY    . I&D EXTREMITY Left 04/06/2015   Procedure: IRRIGATION AND DEBRIDEMENT EXTREMITY;  Surgeon: Gwyneth Revels, DPM;  Location: [facility] ORS;  Service: Podiatry;  Laterality: Left;  . IRRIGATION AND DEBRIDEMENT FOOT Left 05/21/2015   Procedure: IRRIGATION AND DEBRIDEMENT FOOT;  Surgeon: Linus Galas, MD;  Location: [facility] ORS;  Service: Podiatry;  Laterality: Left;  . IRRIGATION AND DEBRIDEMENT  KNEE Right 10/18/2016   Procedure: IRRIGATION AND DEBRIDEMENT KNEE;  Surgeon: Deeann Saint, MD;  Location: [facility] ORS;  Service: Orthopedics;  Laterality: Right;  . KNEE ARTHROSCOPY  10/18/2016   Procedure: ARTHROSCOPY KNEE;  Surgeon: Deeann Saint, MD;  Location: [facility] ORS;  Service: Orthopedics;;  . PERIPHERAL VASCULAR CATHETERIZATION N/A  10/27/2016   Procedure: Dialysis/Perma Catheter Insertion;  Surgeon: Annice Needy, MD;  Location: [facility] INVASIVE CV LAB;  Service: Cardiovascular;  Laterality: N/A;    Prior to Admission medications   Medication Sig Start Date End Date Taking? Authorizing Provider  amiodarone (PACERONE) 200 MG tablet Take 1 tablet (200 mg total) by mouth daily. 09/25/16  Yes Shaune Pollack, MD  aspirin EC 81 MG EC tablet Take 1 tablet (81 mg total) by mouth daily. 07/23/16  Yes Richard Renae Gloss, MD  bisacodyl (DULCOLAX) 10 MG suppository Place 1 suppository (10 mg total) rectally daily as needed for moderate constipation. 10/27/16  Yes Altamese Dilling, MD  BREO ELLIPTA 100-25 MCG/INH AEPB Inhale 1 puff into the lungs daily.  11/09/16  Yes Historical Provider, MD  carvedilol (COREG) 3.125 MG tablet Take 1 tablet (3.125 mg total) by mouth 2 (two) times daily with a meal. 10/27/16  Yes Altamese Dilling, MD  cefdinir (OMNICEF) 300 MG capsule Take 1 capsule (300 mg total) by mouth 2 (two) times daily. 01/09/17  Yes Sharman Cheek, MD  furosemide (LASIX) 80 MG tablet Take 1 tablet (80 mg total) by mouth 2 (two) times daily. 10/31/16  Yes Enid Baas, MD  insulin aspart (NOVOLOG) 100 UNIT/ML injection Inject 4 Units into the skin 3 (three) times daily with meals. 10/31/16  Yes Enid Baas, MD  methimazole (TAPAZOLE) 10 MG tablet Take 1 tablet (10 mg total) by mouth daily. 07/22/16  Yes Alford Highland, MD  Nutritional Supplements (FEEDING SUPPLEMENT, NEPRO CARB STEADY,) LIQD Take 237 mLs by mouth 2 (two) times daily between meals. 10/27/16  Yes Altamese Dilling, MD  pantoprazole (PROTONIX) 40 MG tablet Take 1 tablet (40 mg total) by mouth daily. 09/25/16  Yes Shaune Pollack, MD  spironolactone (ALDACTONE) 25 MG tablet Take 1 tablet (25 mg total) by mouth daily. 12/09/16  Yes Enid Baas, MD  zolpidem (AMBIEN) 5 MG tablet Take 5 mg by mouth at bedtime as needed for sleep.  11/14/16  Yes Historical Provider, MD      Allergies Sulfur  Family History  Problem Relation Age of Onset  . Coronary artery disease Father   . Pancreatic cancer Mother   . Breast cancer Sister   . Lung cancer Brother   . Cervical cancer Sister     Social History Social History  Substance Use Topics  . Smoking status: Never Smoker  . Smokeless tobacco: Never Used  . Alcohol use No    Review of Systems Constitutional: No fever/chills Eyes: No visual changes. ENT: No sore throat. Cardiovascular: Denies chest pain. Respiratory: Denies shortness of breath. Gastrointestinal: Generalized abdominal pain.  nausea, no vomiting.  No diarrhea.  No constipation.  +Abdominal swelling Genitourinary: Negative for dysuria. Musculoskeletal: Negative for back pain. Skin: Negative for rash. Neurological: Negative for headaches, focal weakness or numbness.  10-point ROS otherwise negative.  ____________________________________________   PHYSICAL EXAM:  VITAL SIGNS: ED Triage Vitals  Enc Vitals Group     BP 01/14/17 0138 124/82     Pulse Rate 01/14/17 0138 80     Resp 01/14/17 0138 18     Temp 01/14/17 0138 98.5 F (36.9 C)     Temp  Source 01/14/17 0138 Oral     SpO2 01/14/17 0138 98 %     Weight 01/14/17 0135 186 lb (84.4 kg)     Height 01/14/17 0135  (1.88 m)     Head Circumference --      Peak Flow --      Pain Score 01/14/17 0135 10     Pain Loc --      Pain Edu? --      Excl. in GC? --     Constitutional: Alert and oriented. Well appearing and in no acute distress. Eyes: Conjunctivae are normal. PERRL. EOMI. Head: Atraumatic. Nose: No congestion/rhinnorhea. Mouth/Throat: Mucous membranes are moist. Neck: No stridor.  No meningeal signs.   Cardiovascular: Normal rate, regular rhythm. Good peripheral circulation. Grossly normal heart sounds. Respiratory: Normal respiratory effort.  No retractions. Lungs CTAB. Gastrointestinal: Soft with diffused tenderness to palpation throughout abdomen.  Mild  distention. Musculoskeletal: No lower extremity tenderness nor edema. No gross deformities of extremities. Neurologic:  Normal speech and language. No gross focal neurologic deficits are appreciated.  Skin:  Skin is warm, dry and intact. No rash noted. Psychiatric: Mood and affect are normal. Speech and behavior are normal.  ____________________________________________   LABS (all labs ordered are listed, but only abnormal results are displayed)  Labs Reviewed  COMPREHENSIVE METABOLIC PANEL - Abnormal; Notable for the following:       Result Value   Sodium 131 (*)    Chloride 96 (*)    Glucose, Bld 139 (*)    BUN 139 (*)    Creatinine, Ser 4.72 (*)    Total Protein 8.4 (*)    Albumin 2.8 (*)    ALT 10 (*)    GFR calc non Af Amer 13 (*)    GFR calc Af Amer 16 (*)    All other components within normal limits  CBC - Abnormal; Notable for the following:    RBC 3.57 (*)    Hemoglobin 9.0 (*)    HCT 28.6 (*)    MCH 25.3 (*)    MCHC 31.6 (*)    RDW 17.9 (*)    All other components within normal limits  LIPASE, BLOOD  URINALYSIS, COMPLETE (UACMP) WITH MICROSCOPIC   ____________________________________________  EKG  None - EKG not ordered by ED physician ____________________________________________  RADIOLOGY   No results found.  ____________________________________________   PROCEDURES  Critical Care performed: No   Procedure(s) performed:   Procedures   ____________________________________________   INITIAL IMPRESSION / ASSESSMENT AND PLAN / ED COURSE  Pertinent labs & imaging results that were available during my care of the patient were reviewed by me and considered in my medical decision making (see chart for details).  acute on chronic kidney disease with an extremely elevated BUN and an elevated creatinine from baseline.  His abdominal pain may be consistent with SBP although he has had negative cultures in the past in spite of elevated white blood  cells.  There is no easily or safely accessiblefluid pocket for a bedside paracentesis. I will treat him empirically and admit him for further management.  Of note, Dr. Wynelle Link was called in consultation by Dr. Scotty Court when the patient was last in the emergency department, and it was Dr. Wynelle Link that recommended admission. I will pass this along to the hospitalist.  Cefotaxime is recommended by up-to-date for the treatment of SBP, but it is on national shortage, so as an alternative I will provide ceftriaxone 2 g IV in addition  to vancomycin 1 g IV.    ____________________________________________  FINAL CLINICAL IMPRESSION(S) / ED DIAGNOSES  Final diagnoses:  Acute renal failure superimposed on chronic kidney disease, unspecified CKD stage, unspecified acute renal failure type (HCC)  Generalized abdominal pain  Other ascites     MEDICATIONS GIVEN DURING THIS VISIT:  Medications  cefTRIAXone (ROCEPHIN) 2 g in dextrose 5 % 50 mL IVPB (not administered)  vancomycin (VANCOCIN) IVPB 1000 mg/200 mL premix (not administered)     NEW OUTPATIENT MEDICATIONS STARTED DURING THIS VISIT:  New Prescriptions   No medications on file    Modified Medications   No medications on file    Discontinued Medications   CIPROFLOXACIN (CIPRO) 500 MG TABLET    Take 1 tablet (500 mg total) by mouth daily. X 10 more days   HYDROCODONE-ACETAMINOPHEN (NORCO) 7.5-325 MG TABLET    Take 1 tablet by mouth every 6 (six) hours as needed for moderate pain (breakthrough pain).     Note:  This document was prepared using Dragon voice recognition software and may include unintentional dictation errors.    Loleta Rose, MD 01/14/17 [phone]

## 2017-01-14 NOTE — ED Triage Notes (Signed)
Patient states that he was seen here on Wednesday for abdominal pain and was told that he had an infection in his abdomin and started on antibiotics. Patient states that yesterday he started having increase pain and swelling in his abdomin. Patient states that he has also had nausea. Patient reports that when he was seen here on Wednesday the ER Md was concerned that he may need to start dialysis again but his nephrologist told him that he did not.

## 2017-01-14 NOTE — H&P (Signed)
[facility] [place] at [facility]   PATIENT NAME: Juan Roberson    MR#:  [id number]  DATE OF BIRTH:  03/10/1971  DATE OF ADMISSION:  01/14/2017  PRIMARY CARE PHYSICIAN: Corky Downs, MD   REQUESTING/REFERRING PHYSICIAN:   CHIEF COMPLAINT:   Chief Complaint  Patient presents with  . Abdominal Pain  . Nausea    HISTORY OF PRESENT ILLNESS: Juan Roberson  is a 46 y.o. male with a known history of Congestive heart failure with EF of 20%, chronic kidney disease stage III, diabetes mellitus type 2, hypertension, left leg DVT, myocardial infarction, osteoarthritis presented to the emergency room with abdominal discomfort since last 2 days. Abdominal pain is located around the umbilical area and epigastric area and it is aching in nature 4 out of 10 on a scale of 1-10. Patient also has some nausea but no vomiting. No complaints of any rectal bleed any hematemesis and hemoptysis. Patient was worked up with CT abdomen in the emergency room which showed large amount of pleural effusion and large amount of ascites. He had paracentesis and 8 L removed of ascitic fluid 1 month ago. During the workup in the emergency room today his renal functions have worsened. Hospitalist service was consulted for further care of the patient. Patient currently on oral antibiotic as outpatient. No fever, chills and patient has been compliant on Lasix medication.  PAST MEDICAL HISTORY:   Past Medical History:  Diagnosis Date  . Cellulitis and abscess of foot    Left-Dr. Ether Griffins  . CHF (congestive heart failure) (HCC)    EF 20%  . CKD (chronic kidney disease), stage III   . Diabetes mellitus without complication (HCC)    a. Dx ~ 1996.  [name] Dysrhythmia   . Essential hypertension   . Gastritis    a. 04/2015 hematemesis -> EGD: gastritis, esophagitis, duodenitis.  No active bleeding.  PPI added.  [name] GERD (gastroesophageal reflux disease)   . Left leg DVT (HCC)    a. Dx 05/2015 -> Coumadin.  . MI  (myocardial infarction) (HCC)   . Osteomyelitis (HCC)    a. 05/2015 L foot.    PAST SURGICAL HISTORY: Past Surgical History:  Procedure Laterality Date  . CARDIAC CATHETERIZATION Right 10/26/2016   Procedure: Left Heart Cath and Coronary Angiography;  Surgeon: Laurier Nancy, MD;  Location: [facility] INVASIVE CV LAB;  Service: Cardiovascular;  Laterality: Right;  . DIALYSIS/PERMA CATHETER REMOVAL N/A 12/05/2016   Procedure: Dialysis/Perma Catheter Removal;  Surgeon: Renford Dills, MD;  Location: [facility] INVASIVE CV LAB;  Service: Cardiovascular;  Laterality: N/A;  . ESOPHAGOGASTRODUODENOSCOPY N/A 04/07/2015   Procedure: ESOPHAGOGASTRODUODENOSCOPY (EGD);  Surgeon: Midge Minium, MD;  Location: [facility] ENDOSCOPY;  Service: Endoscopy;  Laterality: N/A;  . ESOPHAGOGASTRODUODENOSCOPY (EGD) WITH PROPOFOL Left 06/30/2015   Procedure: ESOPHAGOGASTRODUODENOSCOPY (EGD) WITH PROPOFOL;  Surgeon: Christena Deem, MD;  Location: [facility] ENDOSCOPY;  Service: Endoscopy;  Laterality: Left;  . FOOT SURGERY    . I&D EXTREMITY Left 04/06/2015   Procedure: IRRIGATION AND DEBRIDEMENT EXTREMITY;  Surgeon: Gwyneth Revels, DPM;  Location: [facility] ORS;  Service: Podiatry;  Laterality: Left;  . IRRIGATION AND DEBRIDEMENT FOOT Left 05/21/2015   Procedure: IRRIGATION AND DEBRIDEMENT FOOT;  Surgeon: Linus Galas, MD;  Location: [facility] ORS;  Service: Podiatry;  Laterality: Left;  . IRRIGATION AND DEBRIDEMENT KNEE Right 10/18/2016   Procedure: IRRIGATION AND DEBRIDEMENT KNEE;  Surgeon: Deeann Saint, MD;  Location: [facility] ORS;  Service: Orthopedics;  Laterality: Right;  . KNEE ARTHROSCOPY  10/18/2016  Procedure: ARTHROSCOPY KNEE;  Surgeon: Deeann Saint, MD;  Location: [facility] ORS;  Service: Orthopedics;;  . PERIPHERAL VASCULAR CATHETERIZATION N/A 10/27/2016   Procedure: Dialysis/Perma Catheter Insertion;  Surgeon: Annice Needy, MD;  Location: [facility] INVASIVE CV LAB;  Service: Cardiovascular;  Laterality: N/A;    SOCIAL HISTORY:  Social History  Substance  Use Topics  . Smoking status: Never Smoker  . Smokeless tobacco: Never Used  . Alcohol use No    FAMILY HISTORY:  Family History  Problem Relation Age of Onset  . Coronary artery disease Father   . Pancreatic cancer Mother   . Breast cancer Sister   . Lung cancer Brother   . Cervical cancer Sister     DRUG ALLERGIES:  Allergies  Allergen Reactions  . Sulfur Other (See Comments)    Pt states that this medication causes renal failure.      REVIEW OF SYSTEMS:   CONSTITUTIONAL: No fever, fatigue or weakness.  EYES: No blurred or double vision.  EARS, NOSE, AND THROAT: No tinnitus or ear pain.  RESPIRATORY: No cough, shortness of breath, wheezing or hemoptysis.  CARDIOVASCULAR: No chest pain, orthopnea, edema.  GASTROINTESTINAL: Has nausea,  abdominal pain.  No diarrhoea GENITOURINARY: No dysuria, hematuria.  ENDOCRINE: No polyuria, nocturia,  HEMATOLOGY: No anemia, easy bruising or bleeding SKIN: No rash or lesion. MUSCULOSKELETAL: No joint pain or arthritis.   NEUROLOGIC: No tingling, numbness, weakness.  PSYCHIATRY: No anxiety or depression.   MEDICATIONS AT HOME:  Prior to Admission medications   Medication Sig Start Date End Date Taking? Authorizing Provider  amiodarone (PACERONE) 200 MG tablet Take 1 tablet (200 mg total) by mouth daily. 09/25/16  Yes Shaune Pollack, MD  aspirin EC 81 MG EC tablet Take 1 tablet (81 mg total) by mouth daily. 07/23/16  Yes Richard Renae Gloss, MD  bisacodyl (DULCOLAX) 10 MG suppository Place 1 suppository (10 mg total) rectally daily as needed for moderate constipation. 10/27/16  Yes Altamese Dilling, MD  BREO ELLIPTA 100-25 MCG/INH AEPB Inhale 1 puff into the lungs daily.  11/09/16  Yes Historical Provider, MD  carvedilol (COREG) 3.125 MG tablet Take 1 tablet (3.125 mg total) by mouth 2 (two) times daily with a meal. 10/27/16  Yes Altamese Dilling, MD  cefdinir (OMNICEF) 300 MG capsule Take 1 capsule (300 mg total) by mouth 2 (two) times  daily. 01/09/17  Yes Sharman Cheek, MD  furosemide (LASIX) 80 MG tablet Take 1 tablet (80 mg total) by mouth 2 (two) times daily. 10/31/16  Yes Enid Baas, MD  insulin aspart (NOVOLOG) 100 UNIT/ML injection Inject 4 Units into the skin 3 (three) times daily with meals. 10/31/16  Yes Enid Baas, MD  methimazole (TAPAZOLE) 10 MG tablet Take 1 tablet (10 mg total) by mouth daily. 07/22/16  Yes Alford Highland, MD  Nutritional Supplements (FEEDING SUPPLEMENT, NEPRO CARB STEADY,) LIQD Take 237 mLs by mouth 2 (two) times daily between meals. 10/27/16  Yes Altamese Dilling, MD  pantoprazole (PROTONIX) 40 MG tablet Take 1 tablet (40 mg total) by mouth daily. 09/25/16  Yes Shaune Pollack, MD  spironolactone (ALDACTONE) 25 MG tablet Take 1 tablet (25 mg total) by mouth daily. 12/09/16  Yes Enid Baas, MD  traMADol (ULTRAM) 50 MG tablet Take 50 mg by mouth every 6 (six) hours. 01/09/17  Yes Historical Provider, MD  zolpidem (AMBIEN) 5 MG tablet Take 5 mg by mouth at bedtime as needed for sleep.  11/14/16   Historical Provider, MD      PHYSICAL EXAMINATION:  VITAL SIGNS: Blood pressure 124/81, pulse 67, temperature 98.5 F (36.9 C), temperature source Oral, resp. rate 18, height  (1.88 m), weight 84.4 kg (186 lb), SpO2 92 %.  GENERAL:  46 y.o.-year-old patient lying in the bed with no acute distress.  EYES: Pupils equal, round, reactive to light and accommodation. No scleral icterus. Extraocular muscles intact.  HEENT: Head atraumatic, normocephalic. Oropharynx and nasopharynx clear.  NECK:  Supple, no jugular venous distention. No thyroid enlargement, no tenderness.  LUNGS: Normal breath sounds bilaterally, no wheezing, rales,rhonchi or crepitation. No use of accessory muscles of respiration.  CARDIOVASCULAR: S1, S2 normal. No murmurs, rubs, or gallops.  ABDOMEN: Soft, tenderness around umbilicus and epigastrium, distended. Bowel sounds present. No organomegaly or mass. Ascites  present. EXTREMITIES: No pedal edema, cyanosis, or clubbing.  NEUROLOGIC: Cranial nerves II through XII are intact. Muscle strength 5/5 in all extremities. Sensation intact. Gait not checked.  PSYCHIATRIC: The patient is alert and oriented x 3.  SKIN: No obvious rash, lesion, or ulcer.   LABORATORY PANEL:   CBC  Recent Labs Lab 01/09/17 0906 01/14/17 0203  WBC 6.4 6.2  HGB 9.8* 9.0*  HCT 30.3* 28.6*  PLT 327 332  MCV 78.2* 80.2  MCH 25.4* 25.3*  MCHC 32.4 31.6*  RDW 18.1* 17.9*   ------------------------------------------------------------------------------------------------------------------  Chemistries   Recent Labs Lab 01/09/17 0906 01/14/17 0203  NA 132* 131*  K 4.5 4.2  CL 95* 96*  CO2 25 24  GLUCOSE 147* 139*  BUN 138* 139*  CREATININE 4.69* 4.72*  CALCIUM 9.3 9.3  AST 17 15  ALT 10* 10*  ALKPHOS 71 66  BILITOT 1.0 0.9   ------------------------------------------------------------------------------------------------------------------ estimated creatinine clearance is 22.5 mL/min (A) (by C-G formula based on SCr of 4.72 mg/dL (H)). ------------------------------------------------------------------------------------------------------------------ No results for input(s): TSH, T4TOTAL, T3FREE, THYROIDAB in the last 72 hours.  Invalid input(s): FREET3   Coagulation profile No results for input(s): INR, PROTIME in the last 168 hours. ------------------------------------------------------------------------------------------------------------------- No results for input(s): DDIMER in the last 72 hours. -------------------------------------------------------------------------------------------------------------------  Cardiac Enzymes No results for input(s): CKMB, TROPONINI, MYOGLOBIN in the last 168 hours.  Invalid input(s): CK ------------------------------------------------------------------------------------------------------------------ Invalid  input(s): POCBNP  ---------------------------------------------------------------------------------------------------------------  Urinalysis    Component Value Date/Time   COLORURINE YELLOW (A) 01/09/2017 0906   APPEARANCEUR CLEAR (A) 01/09/2017 0906   APPEARANCEUR Clear 07/26/2014 1920   LABSPEC 1.011 01/09/2017 0906   LABSPEC 1.012 07/26/2014 1920   PHURINE 5.0 01/09/2017 0906   GLUCOSEU NEGATIVE 01/09/2017 0906   GLUCOSEU Negative 07/26/2014 1920   HGBUR SMALL (A) 01/09/2017 0906   BILIRUBINUR NEGATIVE 01/09/2017 0906   BILIRUBINUR Negative 07/26/2014 1920   KETONESUR NEGATIVE 01/09/2017 0906   PROTEINUR 30 (A) 01/09/2017 0906   NITRITE NEGATIVE 01/09/2017 0906   LEUKOCYTESUR NEGATIVE 01/09/2017 0906   LEUKOCYTESUR Negative 07/26/2014 1920     RADIOLOGY: No results found.  EKG: Orders placed or performed during the hospital encounter of 12/05/16  . ED EKG  . ED EKG  . EKG    IMPRESSION AND PLAN: 46 year old male patient with history of congestive heart failure, chronic kidney disease stage III diabetes mellitus2, hypertension presented to the emergency room with abdominal pain and distention. Admitting diagnosis 1. Congestive heart failure exacerbation 2. Acute on chronic renal failure 3. Hyponatremia 4. Ascites Treatment plan Admit patient to medical floor IV Lasix 60 MG every 12 hourly for diuresis Ultrasound-guided paracentesis Follow-up sodium level Nephrology consultation for renal failure Follow-up renal function Supportive care  All the records are  reviewed and case discussed with ED provider. Management plans discussed with the patient, family and they are in agreement.  CODE STATUS:FULL CODE Code Status History    Date Active Date Inactive Code Status Order ID Comments User Context   12/05/2016  8:01 PM 12/08/2016  3:11 PM Full Code [id number]  Altamese Dilling, MD Inpatient   10/28/2016  8:42 PM 10/31/2016  8:35 PM Full Code [id number]  Houston Siren, MD Inpatient   10/17/2016  8:18 PM 10/27/2016  6:48 PM Full Code [id number]  Auburn Bilberry, MD Inpatient   09/24/2016  3:36 PM 09/25/2016  2:15 PM Full Code [id number]  Wyatt Haste, MD ED   07/21/2016 11:05 AM 07/22/2016  3:11 PM Full Code [id number]  Alford Highland, MD ED   02/28/2016  1:43 AM 03/01/2016  6:06 PM Full Code [id number]  Arnaldo Natal, MD Inpatient   12/06/2015  8:39 PM 12/08/2015  2:51 PM Full Code [id number]  Altamese Dilling, MD Inpatient   11/01/2015  4:00 PM 11/03/2015  2:28 PM Full Code [id number]  Wyatt Haste, MD ED   06/29/2015  2:30 AM 07/01/2015  2:21 PM Full Code [id number]  Arnaldo Natal, MD Inpatient   05/21/2015  4:34 PM 05/25/2015  4:34 PM Full Code [id number]  Linus Galas, MD Inpatient   05/18/2015  9:17 PM 05/21/2015  4:34 PM Full Code [id number]  Shaune Pollack, MD Inpatient   04/05/2015  7:05 AM 04/08/2015  2:00 PM Full Code [id number]  Arnaldo Natal, MD Inpatient       TOTAL TIME TAKING CARE OF THIS PATIENT: 52 minutes.    Ihor Austin M.D on 01/14/2017 at 4:59 AM  Between 7am to 6pm - Pager - [phone]  After 6pm go to [email or web address] - password [email or web address] [facility]  [facility]  Office  [phone]  CC: Primary care physician; Corky Downs, MD

## 2017-01-14 NOTE — Plan of Care (Addendum)
Patient continues to have dry heaves.  Just having difficulty getting comfortable.  Patient agreed to stay NPO for now till Dr. Diego Cory, even though he currently has diet ordered. Patinet also has 10/10 pain. Indicates he took tramadol at home and it does not seem to work. Dr. Text to inform and request possible IV PRN for severe pain.

## 2017-01-14 NOTE — Progress Notes (Signed)
Pt with orders for standing insulin which is scheduled with meals pt is refusing since he has not eaten. However, there are no orders for achs blood glucose checks. Per MD will place orders for checks achs checks and sensitive sliding scale insulin.

## 2017-01-14 NOTE — ED Notes (Signed)
Verbal report to Noel, RN 

## 2017-01-14 NOTE — ED Notes (Signed)
Pt transport to 214 

## 2017-01-15 ENCOUNTER — Inpatient Hospital Stay: Payer: BLUE CROSS/BLUE SHIELD

## 2017-01-15 ENCOUNTER — Inpatient Hospital Stay
Admit: 2017-01-15 | Discharge: 2017-01-15 | Disposition: A | Discharge status: Home / Self Care | Payer: BLUE CROSS/BLUE SHIELD | Attending: Registered Nurse | Admitting: Registered Nurse

## 2017-01-15 LAB — GLUCOSE, CAPILLARY
GLUCOSE-CAPILLARY: 172 mg/dL — AB (ref 65–99)
GLUCOSE-CAPILLARY: 154 mg/dL — AB (ref 65–99)
GLUCOSE-CAPILLARY: 186 mg/dL — AB (ref 65–99)
Glucose-Capillary: 167 mg/dL — ABNORMAL HIGH (ref 65–99)

## 2017-01-15 LAB — RENAL FUNCTION PANEL
ALBUMIN: 2.4 g/dL — AB (ref 3.5–5.0)
ANION GAP: 10 (ref 5–15)
BUN: 141 mg/dL — AB (ref 6–20)
CALCIUM: 8.5 mg/dL — AB (ref 8.9–10.3)
CO2: 23 mmol/L (ref 22–32)
Chloride: 96 mmol/L — ABNORMAL LOW (ref 101–111)
Creatinine, Ser: 4.73 mg/dL — ABNORMAL HIGH (ref 0.61–1.24)
GFR calc Af Amer: 16 mL/min — ABNORMAL LOW (ref >60)
GFR, EST NON AFRICAN AMERICAN: 13 mL/min — AB (ref >60)
Glucose, Bld: 184 mg/dL — ABNORMAL HIGH (ref 65–99)
PHOSPHORUS: 6.5 mg/dL — AB (ref 2.5–4.6)
POTASSIUM: 4.9 mmol/L (ref 3.5–5.1)
Sodium: 129 mmol/L — ABNORMAL LOW (ref 135–145)

## 2017-01-15 LAB — BODY FLUID CELL COUNT WITH DIFFERENTIAL
Eos, Fluid: 0 %
Lymphs, Fluid: 22 %
MONOCYTE-MACROPHAGE-SEROUS FLUID: 27 %
Neutrophil Count, Fluid: 51 %
Total Nucleated Cell Count, Fluid: 802 cu mm

## 2017-01-15 LAB — MRSA PCR SCREENING: MRSA BY PCR: NEGATIVE

## 2017-01-15 NOTE — Progress Notes (Signed)
Subjective:  Patient is known to our practice from previous admissions and outpatient.  He had presented to the emergency room on Wednesday with abdominal pain.  He was started on antibiotics for SBP.  He left because he said he had an appointment with his eye doctor. He presents again for generalized abdominal pain and distention He underwent abdominal paracentesis this morning.  5.3 L of peritoneal fluid was removed Cytology shows total WBC count of 800, 51% neutrophils Vomiting this morning   Objective:  Vital signs in last 24 hours:  Temp:  [98 F (36.7 C)-98.4 F (36.9 C)] 98 F (36.7 C) (04/16 1212) Pulse Rate:  [43-78] 72 (04/16 1212) Resp:  [16-20] 16 (04/16 1212) BP: (98-123)/(61-76) 98/61 (04/16 1212) SpO2:  [90 %-97 %] 94 % (04/16 1212)  Weight change:  Filed Weights   01/14/17 0135  Weight: 84.4 kg (186 lb)    Intake/Output:    Intake/Output Summary (Last 24 hours) at 01/15/17 1549 Last data filed at 01/15/17 0926  Gross per 24 hour  Intake              110 ml  Output              250 ml  Net             -140 ml     Physical Exam: General: No acute distress, laying in the bed  HEENT Moist oral mucous membranes  Neck supple  Pulm/lungs Normal breathing effort, clear to auscultation  CVS/Heart No rub or gallop  Abdomen:  Soft, nontender, mildly distended  Extremities: No peripheral edema  Neurologic: Alert, oriented  Skin: Multiple tattoos, normal turgor          Basic Metabolic Panel:   Recent Labs Lab 01/09/17 0906 01/14/17 0203 01/15/17 0351  NA 132* 131* 129*  K 4.5 4.2 4.9  CL 95* 96* 96*  CO2 GLUCOSE 147* 139* 184*  BUN 138* 139* 141*  CREATININE 4.69* 4.72* 4.73*  CALCIUM 9.3 9.3 8.5*  PHOS  --   --  6.5*     CBC:  Recent Labs Lab 01/09/17 0906 01/14/17 0203  WBC 6.4 6.2  HGB 9.8* 9.0*  HCT 30.3* 28.6*  MCV 78.2* 80.2  PLT 327 332      Microbiology:  Recent Results (from the past 720 hour(s))  MRSA  PCR Screening     Status: None   Collection Time: 01/15/17 12:48 PM  Result Value Ref Range Status   MRSA by PCR NEGATIVE NEGATIVE Final    Comment:        The GeneXpert MRSA Assay (FDA approved for NASAL specimens only), is one component of a comprehensive MRSA colonization surveillance program. It is not intended to diagnose MRSA infection nor to guide or monitor treatment for MRSA infections.     Coagulation Studies: No results for input(s): LABPROT, INR in the last 72 hours.  Urinalysis:  Recent Labs  01/14/17 1100  COLORURINE YELLOW*  LABSPEC 1.011  PHURINE 5.0  GLUCOSEU NEGATIVE  HGBUR NEGATIVE  BILIRUBINUR NEGATIVE  KETONESUR NEGATIVE  PROTEINUR NEGATIVE  NITRITE NEGATIVE  LEUKOCYTESUR NEGATIVE      Imaging: US Paracentesis  Result Date: 01/15/2017 INDICATION: Recurrent ascites, abdominal pain, distention, concern for bacterial peritonitis. EXAM: ULTRASOUND GUIDED PARACENTESIS MEDICATIONS: 1% lidocaine locally COMPLICATIONS: None immediate. PROCEDURE: An ultrasound guided paracentesis was thoroughly discussed with the patient and questions answered. The benefits, risks, alternatives and complications were also discussed. The patient understands  and wishes to proceed with the procedure. Written consent was obtained. Ultrasound was performed to localize and mark an adequate pocket of fluid in the right lower quadrant of the abdomen. The area was then prepped and draped in the normal sterile fashion. 1% Lidocaine was used for local anesthesia. Under ultrasound guidance a safety centesis needle catheter was introduced. Paracentesis was performed. The catheter was removed and a dressing applied. FINDINGS: A total of approximately 5.35 L of amber colored peritoneal fluid was removed. A fluid sample was sent for culture. IMPRESSION: Successful ultrasound guided paracentesis yielding 5.35 L of ascites. Electronically Signed   By: Judie Petit.  Shick M.D.   On: 01/15/2017 10:49      Medications:    . amiodarone  200 mg Oral Daily  . carvedilol  3.125 mg Oral BID WC  . cefTRIAXone (ROCEPHIN)  IV  2 g Intravenous Q24H  . feeding supplement (NEPRO CARB STEADY)  237 mL Oral BID BM  . fluticasone furoate-vilanterol  1 puff Inhalation Daily  . insulin aspart  0-5 Units Subcutaneous QHS  . insulin aspart  0-9 Units Subcutaneous TID WC  . insulin aspart  4 Units Subcutaneous TID WC  . methimazole  10 mg Oral Daily  . pantoprazole  40 mg Oral Daily  . sodium chloride flush  3 mL Intravenous Q12H   acetaminophen **OR** acetaminophen, bisacodyl, morphine injection, ondansetron **OR** ondansetron (ZOFRAN) IV, traMADol, zolpidem  Assessment/ Plan:  46 y.o.caucs[place]  male  with hypertension, diabetes mellitus type 2, chronic kidney disease stage III, peripheral neuropathy  1. Acute renal failure on Chronic kidney disease stage IV Patient has likely progressed to end-stage renal disease.  He has mild uremic symptoms in the form of poor appetite, nausea and vomiting. Discussed restarting hemodialysis.  Patient has agreed to it.  We'll place a consult to vascular surgery for PermCath placement.  We will start his dialysis as soon as access is available  2. Peritonitis with ascites - Currently treated with IV Rocephin  3. Anemia of chronic kidney disease: hemoglobin 9.0 Has received epo in the past.    LOS: 1 Masey Scheiber 4/16/20183:49 PM

## 2017-01-15 NOTE — Progress Notes (Signed)
[facility] [place] at [facility]   PATIENT NAME: Juan Roberson    MR#:  [id number]  DATE OF BIRTH:  1971-01-16  SUBJECTIVE:  Patient was seen here in the emergency room with similar symptoms of abdominal pain and abdominal distention and he left the ER since he had eye appointment the next day. Comes in with abdominal pain and abdominal distention and admitted with elevated creatinine of 4.72. Feels better after paracentesis Continues with vomiting REVIEW OF SYSTEMS:   Review of Systems  Constitutional: Negative for chills, fever and weight loss.  HENT: Negative for ear discharge, ear pain and nosebleeds.   Eyes: Negative for blurred vision, pain and discharge.  Respiratory: Positive for shortness of breath. Negative for sputum production, wheezing and stridor.   Cardiovascular: Positive for chest pain. Negative for palpitations, orthopnea and PND.  Gastrointestinal: Positive for abdominal pain and nausea. Negative for diarrhea and vomiting.  Genitourinary: Negative for frequency and urgency.  Musculoskeletal: Negative for back pain and joint pain.  Neurological: Positive for weakness. Negative for sensory change, speech change and focal weakness.  Psychiatric/Behavioral: Negative for depression and hallucinations. The patient is not nervous/anxious.    Tolerating Diet:some Tolerating PT: not needed  DRUG ALLERGIES:   Allergies  Allergen Reactions  . Sulfur Other (See Comments)    Pt states that this medication causes renal failure.      VITALS:  Blood pressure (!) 98/59, pulse 94, temperature 98 F (36.7 C), temperature source Oral, resp. rate 16, height  (1.88 m), weight 84.4 kg (186 lb), SpO2 94 %.  PHYSICAL EXAMINATION:   Physical Exam  GENERAL:  46 y.o.-year-old patient lying in the bed with no acute distress. Appears chronically ill EYES: Pupils equal, round, reactive to light and accommodation. No scleral icterus. Extraocular muscles  intact.  HEENT: Head atraumatic, normocephalic. Oropharynx and nasopharynx clear.  NECK:  Supple, no jugular venous distention. No thyroid enlargement, no tenderness.  LUNGS: Decreased breath sounds bilaterally, no wheezing, rales, rhonchi. No use of accessory muscles of respiration.  CARDIOVASCULAR: S1, S2 normal. No murmurs, rubs, or gallops.  ABDOMEN: Soft, nontender, distended, ascites. Bowel sounds present. No organomegaly or mass.  EXTREMITIES: No cyanosis, clubbing or edema b/l.    NEUROLOGIC: Cranial nerves II through XII are intact. No focal Motor or sensory deficits b/l.   PSYCHIATRIC:  patient is alert and oriented x 3.  SKIN: No obvious rash, lesion, or ulcer.   LABORATORY PANEL:  CBC  Recent Labs Lab 01/14/17 0203  WBC 6.2  HGB 9.0*  HCT 28.6*  PLT 332    Chemistries   Recent Labs Lab 01/14/17 0203 01/15/17 0351  NA 131* 129*  K 4.2 4.9  CL 96* 96*  CO2 24 23  GLUCOSE 139* 184*  BUN 139* 141*  CREATININE 4.72* 4.73*  CALCIUM 9.3 8.5*  AST 15  --   ALT 10*  --   ALKPHOS 66  --   BILITOT 0.9  --    Cardiac Enzymes No results for input(s): TROPONINI in the last 168 hours. RADIOLOGY:  US Paracentesis  Result Date: 01/15/2017 INDICATION: Recurrent ascites, abdominal pain, distention, concern for bacterial peritonitis. EXAM: ULTRASOUND GUIDED PARACENTESIS MEDICATIONS: 1% lidocaine locally COMPLICATIONS: None immediate. PROCEDURE: An ultrasound guided paracentesis was thoroughly discussed with the patient and questions answered. The benefits, risks, alternatives and complications were also discussed. The patient understands and wishes to proceed with the procedure. Written consent was obtained. Ultrasound was performed to localize and mark  an adequate pocket of fluid in the right lower quadrant of the abdomen. The area was then prepped and draped in the normal sterile fashion. 1% Lidocaine was used for local anesthesia. Under ultrasound guidance a safety centesis  needle catheter was introduced. Paracentesis was performed. The catheter was removed and a dressing applied. FINDINGS: A total of approximately 5.35 L of amber colored peritoneal fluid was removed. A fluid sample was sent for culture. IMPRESSION: Successful ultrasound guided paracentesis yielding 5.35 L of ascites. Electronically Signed   By: Judie Petit.  Shick M.D.   On: 01/15/2017 10:49   ASSESSMENT AND PLAN:  46 year old male with past medical history of chronic kidney disease stage IV, hypertension, hyperlipidemia, severe ischemic cardiomyopathy ejection fraction of 10%, chronic systolic CHF, GERD, who presents to the hospital due to abdominal pain and noted to be in acute on chronic renal failure.  1. Abdominal pain- due to SBP - His CT scan does show a large volume ascites. - US guided paracentesis >5 liters fluid removed. WBC 805 c/w SBP -Continue IV ceftriaxone   2. Acute on chronic renal failure-patient's BUN and creatinine is elevated from baseline. In March patient's creatinine was 3.7 currently elevated at 4.72 and BUN up to 138. - will get a nephrology consult. Patient has been on dialysis in the past. Hold Lasix and Aldactone for now. -he does not appear to be volume overloaded. -d/w dr Thedore Mins. Pt will need to be on HD again.  Vascular consult pending  3. History of atrial fibrillation-rate controlled. Continue amiodarone.  4. COPD-no acute exacerbation-continue Brio-Ellipta  5. GERD - cont. Protonix.   6. Hyperthyroidism - cont. Tapazole.    7. DM - cont. Novolog w/ meals.  - Carb control diet. Follow BS.   Spoke with Dr. Wynelle Link  Case discussed with Care Management/Social Worker. Management plans discussed with the patient, family and they are in agreement.  CODE STATUS: full  DVT Prophylaxis: lovneox  TOTAL TIME TAKING CARE OF THIS PATIENT: 30 minutes.  >50% time spent on counselling and coordination of care  POSSIBLE D/C IN few  DAYS, DEPENDING ON CLINICAL  CONDITION.  Note: This dictation was prepared with Dragon dictation along with smaller phrase technology. Any transcriptional errors that result from this process are unintentional.  Meredith Kilbride M.D on 01/15/2017 at 7:30 PM  Between 7am to 6pm - Pager - [phone]  After 6pm go to [email or web address] - Social research officer, government  [facility]  Office  [phone]  CC: Primary care physician; Corky Downs, MD

## 2017-01-15 NOTE — Progress Notes (Signed)
Initial Nutrition Assessment  DOCUMENTATION CODES:   Severe malnutrition in context of chronic illness  INTERVENTION:  Continue Nepro Shake po BID, each supplement provides 425 kcal and 19 grams protein.   When phosphorus WNL, patient would prefer to drink Premier Protein instead.   Encouraged adequate intake of calories and protein with meals. Patient reports he cannot eat snacks between meals at this time.   NUTRITION DIAGNOSIS:   Malnutrition (Severe) related to chronic illness (CKD, CHF) as evidenced by severe depletion of body fat, severe depletion of muscle mass.  GOAL:   Patient will meet greater than or equal to 90% of their needs  MONITOR:   PO intake, Supplement acceptance, Labs, Weight trends, I & O's  REASON FOR ASSESSMENT:   Malnutrition Screening Tool    ASSESSMENT:   46 year old male with PMHx of DM type 2, HTN, CKD stage IV, GERD, osteomyelitis left food s/p I&D, left leg DVT on coumadin, gastritis, MI, severe ischemic cardiomyopathy (EF 10%), chronic systolic CHF who presents with abdominal pain found to have ascites, acute on chronic renal failure.   Spoke with patient at bedside. He reports he has had a poor appetite for a few days PTA. He reports the past few days he has only been able to eat bites of fruit. He endorses abdominal pain, abdominal distention, nausea. Patient reports when he is feeling well he eats 100% of three meals daily and drinks Premier Protein daily at home.   Reports UBW "190-something" and that he has been losing weight over only a few weeks. Patient was 194.5 lbs on 12/08/2016 and has lost 8.5 lbs (4.4% body weight) over 1 month, which is not significant for time frame. Per chart patient's weight appears to fluctuate - likely related to fluid changes in setting of CHF and CKD IV.  Medications reviewed and include: amiodarone, ceftriaxone, Novolog sliding scale TID with meals and daily at bedtime, Novolog 4 units TID with meals,  pantoprazole, Zofran PRN, Morphine PRN.  Labs reviewed: CBG 154-185, Sodium 129, Chloride 96, BUN 141, Creatinine 4.73, Phosphorus 6.5.   Nutrition-Focused physical exam completed. Findings are severe fat depletion, moderate-severe muscle depletion, and no edema.   Diet Order:  Diet full liquid Room service appropriate? Yes; Fluid consistency: Thin Diet NPO time specified  Skin:  Reviewed, no issues  Last BM:  01/14/2017  Height:   Ht Readings from Last 1 Encounters:  01/14/17  (1.88 m)    Weight:   Wt Readings from Last 1 Encounters:  01/14/17 186 lb (84.4 kg)    Ideal Body Weight:  86.4 kg  BMI:  Body mass index is 23.88 kg/m.  Estimated Nutritional Needs:   Kcal:  2150-2510 (MSJ x 1.2-1.4)  Protein:  110-130 grams (1.3-1.5 grams/kg)  Fluid:  2.1 L/day  EDUCATION NEEDS:   No education needs identified at this time  Helane Rima, MS, RD, LDN Pager: [phone] After Hours Pager: [phone]

## 2017-01-15 NOTE — Plan of Care (Signed)
Problem: Safety: Goal: Ability to remain free from injury will improve Outcome: Progressing Pt has remained free from falls during my care.   Problem: Pain Managment: Goal: General experience of comfort will improve Outcome: Progressing Pt has complained of abdominal pain during my care and has received PRN pain medication once during my shift.   Problem: Nutrition: Goal: Adequate nutrition will be maintained Outcome: Not Progressing Because of nausea, pt has not been able to eat any meals during my shift.

## 2017-01-15 NOTE — Consult Note (Signed)
Juan Roberson. is a 46 y.o. male  [id number]  Primary Cardiologist: Dr. Adrian Blackwater   Reason for Consultation: History of severe LV dysfunction  HPI: Pt presented to ER with ascites and CHF exacerbation and acute on chronic renal failure.    Review of Systems: Reports nausea but no chest pain or shortness of breath.    Past Medical History:  Diagnosis Date  . Cellulitis and abscess of foot    Left-Dr. Ether Griffins  . CHF (congestive heart failure) (HCC)    EF 20%  . CKD (chronic kidney disease), stage III   . Diabetes mellitus without complication (HCC)    a. Dx ~ 1996.  [name] Dysrhythmia   . Essential hypertension   . Gastritis    a. 04/2015 hematemesis -> EGD: gastritis, esophagitis, duodenitis.  No active bleeding.  PPI added.  [name] GERD (gastroesophageal reflux disease)   . Left leg DVT (HCC)    a. Dx 05/2015 -> Coumadin.  . MI (myocardial infarction) (HCC)   . Osteomyelitis (HCC)    a. 05/2015 L foot.    Medications Prior to Admission  Medication Sig Dispense Refill  . amiodarone (PACERONE) 200 MG tablet Take 1 tablet (200 mg total) by mouth daily. 30 tablet 2  . aspirin EC 81 MG EC tablet Take 1 tablet (81 mg total) by mouth daily. 30 tablet 0  . bisacodyl (DULCOLAX) 10 MG suppository Place 1 suppository (10 mg total) rectally daily as needed for moderate constipation. 12 suppository 0  . BREO ELLIPTA 100-25 MCG/INH AEPB Inhale 1 puff into the lungs daily.   3  . carvedilol (COREG) 3.125 MG tablet Take 1 tablet (3.125 mg total) by mouth 2 (two) times daily with a meal. 60 tablet 0  . cefdinir (OMNICEF) 300 MG capsule Take 1 capsule (300 mg total) by mouth 2 (two) times daily. 14 capsule 0  . furosemide (LASIX) 80 MG tablet Take 1 tablet (80 mg total) by mouth 2 (two) times daily. 60 tablet 2  . insulin aspart (NOVOLOG) 100 UNIT/ML injection Inject 4 Units into the skin 3 (three) times daily with meals. 10 mL 11  . methimazole (TAPAZOLE) 10 MG tablet Take 1 tablet (10 mg  total) by mouth daily. 30 tablet 0  . Nutritional Supplements (FEEDING SUPPLEMENT, NEPRO CARB STEADY,) LIQD Take 237 mLs by mouth 2 (two) times daily between meals. 20 Can 0  . pantoprazole (PROTONIX) 40 MG tablet Take 1 tablet (40 mg total) by mouth daily. 30 tablet 0  . spironolactone (ALDACTONE) 25 MG tablet Take 1 tablet (25 mg total) by mouth daily. 30 tablet 1  . traMADol (ULTRAM) 50 MG tablet Take 50 mg by mouth every 6 (six) hours.  2  . zolpidem (AMBIEN) 5 MG tablet Take 5 mg by mouth at bedtime as needed for sleep.   0     . amiodarone  200 mg Oral Daily  . carvedilol  3.125 mg Oral BID WC  . cefTRIAXone (ROCEPHIN)  IV  2 g Intravenous Q24H  . feeding supplement (NEPRO CARB STEADY)  237 mL Oral BID BM  . fluticasone furoate-vilanterol  1 puff Inhalation Daily  . insulin aspart  0-5 Units Subcutaneous QHS  . insulin aspart  0-9 Units Subcutaneous TID WC  . insulin aspart  4 Units Subcutaneous TID WC  . methimazole  10 mg Oral Daily  . pantoprazole  40 mg Oral Daily  . sodium chloride flush  3 mL Intravenous Q12H  Infusions:   Allergies  Allergen Reactions  . Sulfur Other (See Comments)    Pt states that this medication causes renal failure.      Social History   Social History  . Marital status: Single    Spouse name: N/A  . Number of children: 3  . Years of education: N/A   Occupational History  . unemployed    Social History Main Topics  . Smoking status: Never Smoker  . Smokeless tobacco: Never Used  . Alcohol use No  . Drug use: No  . Sexual activity: No   Other Topics Concern  . Not on file   Social History Narrative   Single, lives alone, 3 children    Family History  Problem Relation Age of Onset  . Coronary artery disease Father   . Pancreatic cancer Mother   . Breast cancer Sister   . Lung cancer Brother   . Cervical cancer Sister     PHYSICAL EXAM: Vitals:   01/15/17 1035 01/15/17 1212  BP: 104/67 98/61  Pulse: 67 72  Resp: 18  16  Temp:  98 F (36.7 C)     Intake/Output Summary (Last 24 hours) at 01/15/17 1313 Last data filed at 01/15/17 0926  Gross per 24 hour  Intake               50 ml  Output              250 ml  Net             -200 ml    General:  Well appearing. No respiratory difficulty HEENT: normal Neck: supple. no JVD. Carotids 2+ bilat; no bruits. No lymphadenopathy or thryomegaly appreciated. Cor: PMI nondisplaced. Regular rate & rhythm. No rubs, gallops or murmurs. Lungs: clear Abdomen: soft, tender in right lower quadrant.   Extremities: no cyanosis, clubbing, rash, edema Neuro: alert & oriented x 3, cranial nerves grossly intact. moves all 4 extremities w/o difficulty. Affect pleasant.   Results for orders placed or performed during the hospital encounter of 01/14/17 (from the past 24 hour(s))  Glucose, capillary     Status: Abnormal   Collection Time: 01/14/17  9:21 PM  Result Value Ref Range   Glucose-Capillary 185 (H) 65 - 99 mg/dL  Renal function panel     Status: Abnormal   Collection Time: 01/15/17  3:51 AM  Result Value Ref Range   Sodium 129 (L) 135 - 145 mmol/L   Potassium 4.9 3.5 - 5.1 mmol/L   Chloride 96 (L) 101 - 111 mmol/L   CO2 23 22 - 32 mmol/L   Glucose, Bld 184 (H) 65 - 99 mg/dL   BUN 161 (H) 6 - 20 mg/dL   Creatinine, Ser 0.96 (H) 0.61 - 1.24 mg/dL   Calcium 8.5 (L) 8.9 - 10.3 mg/dL   Phosphorus 6.5 (H) 2.5 - 4.6 mg/dL   Albumin 2.4 (L) 3.5 - 5.0 g/dL   GFR calc non Af Amer 13 (L) >60 mL/min   GFR calc Af Amer 16 (L) >60 mL/min   Anion gap 10 5 - 15  Glucose, capillary     Status: Abnormal   Collection Time: 01/15/17  7:34 AM  Result Value Ref Range   Glucose-Capillary 172 (H) 65 - 99 mg/dL   Comment 1 Notify RN   Glucose, capillary     Status: Abnormal   Collection Time: 01/15/17 11:45 AM  Result Value Ref Range   Glucose-Capillary 154 (H) 65 -  99 mg/dL   Comment 1 Notify RN    US Paracentesis  Result Date: 01/15/2017 INDICATION: Recurrent  ascites, abdominal pain, distention, concern for bacterial peritonitis. EXAM: ULTRASOUND GUIDED PARACENTESIS MEDICATIONS: 1% lidocaine locally COMPLICATIONS: None immediate. PROCEDURE: An ultrasound guided paracentesis was thoroughly discussed with the patient and questions answered. The benefits, risks, alternatives and complications were also discussed. The patient understands and wishes to proceed with the procedure. Written consent was obtained. Ultrasound was performed to localize and mark an adequate pocket of fluid in the right lower quadrant of the abdomen. The area was then prepped and draped in the normal sterile fashion. 1% Lidocaine was used for local anesthesia. Under ultrasound guidance a safety centesis needle catheter was introduced. Paracentesis was performed. The catheter was removed and a dressing applied. FINDINGS: A total of approximately 5.35 L of amber colored peritoneal fluid was removed. A fluid sample was sent for culture. IMPRESSION: Successful ultrasound guided paracentesis yielding 5.35 L of ascites. Electronically Signed   By: Judie Petit.  Shick M.D.   On: 01/15/2017 10:49     ASSESSMENT AND PLAN:  1)Acute on chronic congestive heart failure. Continue with aggressive IV lasix. Check echo to check heart function.  2)Chronic atrial fibrillation with rate controlled. Continue amiodarone. Consider anticoagulation, once abdominal issues resolve.   Caroleen Hamman

## 2017-01-16 DIAGNOSIS — I509 Heart failure, unspecified: Secondary | ICD-10-CM

## 2017-01-16 DIAGNOSIS — R109 Unspecified abdominal pain: Secondary | ICD-10-CM

## 2017-01-16 DIAGNOSIS — R112 Nausea with vomiting, unspecified: Secondary | ICD-10-CM

## 2017-01-16 DIAGNOSIS — N186 End stage renal disease: Secondary | ICD-10-CM

## 2017-01-16 LAB — ECHOCARDIOGRAM COMPLETE
HEIGHTINCHES: 74 in
WEIGHTICAEL: 2976 [oz_av]

## 2017-01-16 LAB — GLUCOSE, CAPILLARY
GLUCOSE-CAPILLARY: 175 mg/dL — AB (ref 65–99)
GLUCOSE-CAPILLARY: 189 mg/dL — AB (ref 65–99)
GLUCOSE-CAPILLARY: 94 mg/dL (ref 65–99)
GLUCOSE-CAPILLARY: 77 mg/dL (ref 65–99)

## 2017-01-16 LAB — PATHOLOGIST SMEAR REVIEW

## 2017-01-16 MED ORDER — CEFAZOLIN IN D5W 1 GM/50ML IV SOLN
1.0000 g | INTRAVENOUS | Status: DC
  Filled 2017-01-16: qty 50

## 2017-01-16 NOTE — Progress Notes (Signed)
[facility] - [place] at [facility]   PATIENT NAME: Juan Roberson    MR#:  [id number]  DATE OF BIRTH:  [birth year]  SUBJECTIVE:  Patient was seen here in the emergency room with similar symptoms of abdominal pain and abdominal distention and he left the ER since he had eye appointment the next day. Comes in with abdominal pain and abdominal distention and admitted with elevated creatinine of 4.72. Feels better after paracentesis Continues with vomiting REVIEW OF SYSTEMS:   Review of Systems  Constitutional: Negative for chills, fever and weight loss.  HENT: Negative for ear discharge, ear pain and nosebleeds.   Eyes: Negative for blurred vision, pain and discharge.  Respiratory: Positive for shortness of breath. Negative for sputum production, wheezing and stridor.   Cardiovascular: Positive for chest pain. Negative for palpitations, orthopnea and PND.  Gastrointestinal: Positive for abdominal pain and nausea. Negative for diarrhea and vomiting.  Genitourinary: Negative for frequency and urgency.  Musculoskeletal: Negative for back pain and joint pain.  Neurological: Positive for weakness. Negative for sensory change, speech change and focal weakness.  Psychiatric/Behavioral: Negative for depression and hallucinations. The patient is not nervous/anxious.    Tolerating Diet:some Tolerating PT: not needed  DRUG ALLERGIES:   Allergies  Allergen Reactions  . Sulfur Other (See Comments)    Pt states that this medication causes renal failure.      VITALS:  Blood pressure 106/64, pulse 67, temperature 98.4 F (36.9 C), temperature source Oral, resp. rate 18, height  (1.88 m), weight 84.4 kg (186 lb), SpO2 95 %.  PHYSICAL EXAMINATION:   Physical Exam  GENERAL:  46 y.o.-year-old patient lying in the bed with no acute distress. Appears chronically ill EYES: Pupils equal, round, reactive to light and accommodation. No scleral icterus. Extraocular muscles  intact.  HEENT: Head atraumatic, normocephalic. Oropharynx and nasopharynx clear.  NECK:  Supple, no jugular venous distention. No thyroid enlargement, no tenderness.  LUNGS: Decreased breath sounds bilaterally, no wheezing, rales, rhonchi. No use of accessory muscles of respiration.  CARDIOVASCULAR: S1, S2 normal. No murmurs, rubs, or gallops.  ABDOMEN: Soft, nontender, distended, ascites. Bowel sounds present. No organomegaly or mass.  EXTREMITIES: No cyanosis, clubbing or edema b/l.    NEUROLOGIC: Cranial nerves II through XII are intact. No focal Motor or sensory deficits b/l.   PSYCHIATRIC:  patient is alert and oriented x 3.  SKIN: No obvious rash, lesion, or ulcer. tattoo ++  LABORATORY PANEL:  CBC  Recent Labs Lab 01/14/17 0203  WBC 6.2  HGB 9.0*  HCT 28.6*  PLT 332    Chemistries   Recent Labs Lab 01/14/17 0203 01/15/17 0351  NA 131* 129*  K 4.2 4.9  CL 96* 96*  CO2 24 23  GLUCOSE 139* 184*  BUN 139* 141*  CREATININE 4.72* 4.73*  CALCIUM 9.3 8.5*  AST 15  --   ALT 10*  --   ALKPHOS 66  --   BILITOT 0.9  --    Cardiac Enzymes No results for input(s): TROPONINI in the last 168 hours. RADIOLOGY:  US Paracentesis  Result Date: 01/15/2017 INDICATION: Recurrent ascites, abdominal pain, distention, concern for bacterial peritonitis. EXAM: ULTRASOUND GUIDED PARACENTESIS MEDICATIONS: 1% lidocaine locally COMPLICATIONS: None immediate. PROCEDURE: An ultrasound guided paracentesis was thoroughly discussed with the patient and questions answered. The benefits, risks, alternatives and complications were also discussed. The patient understands and wishes to proceed with the procedure. Written consent was obtained. [facility]formed to localize and mark  an adequate pocket of fluid in the right lower quadrant of the abdomen. The area was then prepped and draped in the normal sterile fashion. 1% Lidocaine was used for local anesthesia. Under ultrasound guidance a safety  centesis needle catheter was introduced. Paracentesis was performed. The catheter was removed and a dressing applied. FINDINGS: A total of approximately 5.35 L of amber colored peritoneal fluid was removed. A fluid sample was sent for culture. IMPRESSION: Successful ultrasound guided paracentesis yielding 5.35 L of ascites. Electronically Signed   By: Judie Petit.  Shick M.D.   On: 01/15/2017 10:49   ASSESSMENT AND PLAN:  46 year old male with past medical history of chronic kidney disease stage IV, hypertension, hyperlipidemia, severe ischemic cardiomyopathy ejection fraction of 10%, chronic systolic CHF, GERD, who presents to the hospital due to abdominal pain and noted to be in acute on chronic renal failure.  1. Abdominal pain- due to SBP - His CT scan does show a large volume ascites. - US guided paracentesis >5 liters fluid removed. WBC 805 c/w SBP -Continue IV ceftriaxone   2. Acute on chronic renal failure-patient's BUN and creatinine is elevated from baseline. In March patient's creatinine was 3.7 currently elevated at 4.72 and BUN up to 138. - will get a nephrology consult. Patient has been on dialysis in the past. Hold Lasix and Aldactone for now. -he does not appear to be volume overloaded. -d/w dr Thedore Mins. Pt will need to be on HD again.  Vascular consult pending. Plans to place HD cath tomorrow by Dr Wyn Quaker  3. History of atrial fibrillation-rate controlled. Continue amiodarone.  4. COPD-no acute exacerbation-continue Brio-Ellipta  5. GERD - cont. Protonix.   6. Hyperthyroidism - cont. Tapazole.    7. DM - cont. Novolog w/ meals.  - Carb control diet. Follow BS.    Case discussed with Care Management/Social Worker. Management plans discussed with the patient, family and they are in agreement.  CODE STATUS: full  DVT Prophylaxis: lovneox  TOTAL TIME TAKING CARE OF THIS PATIENT: 25 minutes.  >50% time spent on counselling and coordination of care  POSSIBLE D/C IN few  DAYS,  DEPENDING ON CLINICAL CONDITION.  Note: This dictation was prepared with Dragon dictation along with smaller phrase technology. Any transcriptional errors that result from this process are unintentional.  Pierce Biagini M.D on 01/16/2017 at 8:49 AM  Between 7am to 6pm - Pager - [phone]  After 6pm go to [email or web address] - Social research officer, government  [facility]  Office  [phone]  CC: Primary care physician; Corky Downs, MD

## 2017-01-16 NOTE — Consult Note (Signed)
[facility] Admission History & Physical  MRN : [id number]  Juan Roberson. is a 47 y.o. ([birth year]) male who presents with chief complaint of  Chief Complaint  Patient presents with  . Abdominal Pain  . Nausea  .  History of Present Illness: I am asked to evaluate the patient by Dr. Allena Katz.  The patient was admitted 2 days ago with increasing abdominal pain. He was noted to have associated nausea and vomiting. He rated the pain as a 4 out of 10. CT scan was obtained which demonstrated a large amount of ascites as well as a pleural effusion. He denies fever or chills. During his evaluation he was noted to have had significant deterioration of his renal function.  The patient has been on hemodialysis in the past. Approximately 1 month ago he was felt to be stable and a permacatheter that was used as access was removed. Given this deterioration in his renal function is been determined that he will require reinitiation of his dialysis.    Current Facility-Administered Medications  Medication Dose Route Frequency Provider Last Rate Last Dose  . acetaminophen (TYLENOL) tablet 650 mg  650 mg Oral Q6H PRN Ihor Austin, MD       Or  . acetaminophen (TYLENOL) suppository 650 mg  650 mg Rectal Q6H PRN Ihor Austin, MD      . amiodarone (PACERONE) tablet 200 mg  200 mg Oral Daily Pavan Pyreddy, MD   200 mg at 01/16/17 1055  . bisacodyl (DULCOLAX) suppository 10 mg  10 mg Rectal Daily PRN Ihor Austin, MD      . carvedilol (COREG) tablet 3.125 mg  3.125 mg Oral BID WC Ihor Austin, MD   3.125 mg at 01/16/17 1815  . cefTRIAXone (ROCEPHIN) 2 g in dextrose 5 % 50 mL IVPB  2 g Intravenous Q24H Loleta Rose, MD 100 mL/hr at 01/16/17 0430 2 g at 01/16/17 0430  . feeding supplement (NEPRO CARB STEADY) liquid 237 mL  237 mL Oral BID BM Pavan Pyreddy, MD   237 mL at 01/16/17 1055  . fluticasone furoate-vilanterol (BREO ELLIPTA) 100-25 MCG/INH 1 puff  1 puff Inhalation Daily  Ihor Austin, MD   1 puff at 01/16/17 1055  . insulin aspart (novoLOG) injection 0-5 Units  0-5 Units Subcutaneous QHS Enedina Finner, MD      . insulin aspart (novoLOG) injection 0-9 Units  0-9 Units Subcutaneous TID WC Enedina Finner, MD   2 Units at 01/16/17 1304  . insulin aspart (novoLOG) injection 4 Units  4 Units Subcutaneous TID WC Ihor Austin, MD   4 Units at 01/16/17 1816  . methimazole (TAPAZOLE) tablet 10 mg  10 mg Oral Daily Ihor Austin, MD   10 mg at 01/16/17 1055  . morphine 2 MG/ML injection 1 mg  1 mg Intravenous QID PRN Enedina Finner, MD   1 mg at 01/16/17 1305  . ondansetron (ZOFRAN) tablet 4 mg  4 mg Oral Q6H PRN Ihor Austin, MD       Or  . ondansetron (ZOFRAN) injection 4 mg  4 mg Intravenous Q6H PRN Ihor Austin, MD   4 mg at 01/15/17 0222  . pantoprazole (PROTONIX) EC tablet 40 mg  40 mg Oral Daily Ihor Austin, MD   40 mg at 01/16/17 1055  . sodium chloride flush (NS) 0.9 % injection 3 mL  3 mL Intravenous Q12H Pavan Pyreddy, MD   3 mL at 01/16/17 1055  . traMADol (ULTRAM) tablet 50 mg  50 mg Oral Q12H PRN Ihor Austin, MD   50 mg at 01/14/17 0810  . zolpidem (AMBIEN) tablet 5 mg  5 mg Oral QHS PRN Ihor Austin, MD        Past Medical History:  Diagnosis Date  . Cellulitis and abscess of foot    Left-Dr. Ether Griffins  . CHF (congestive heart failure) (HCC)    EF 20%  . CKD (chronic kidney disease), stage III   . Diabetes mellitus without complication (HCC)    a. Dx ~ 1996.  [name] Dysrhythmia   . Essential hypertension   . Gastritis    a. 04/2015 hematemesis -> EGD: gastritis, esophagitis, duodenitis.  No active bleeding.  PPI added.  [name] GERD (gastroesophageal reflux disease)   . Left leg DVT (HCC)    a. Dx 05/2015 -> Coumadin.  . MI (myocardial infarction) (HCC)   . Osteomyelitis (HCC)    a. 05/2015 L foot.    Past Surgical History:  Procedure Laterality Date  . CARDIAC CATHETERIZATION Right 10/26/2016   Procedure: Left Heart Cath and Coronary Angiography;  Surgeon:  Laurier Nancy, MD;  Location: [facility] INVASIVE CV LAB;  Service: Cardiovascular;  Laterality: Right;  . DIALYSIS/PERMA CATHETER REMOVAL N/A 12/05/2016   Procedure: Dialysis/Perma Catheter Removal;  Surgeon: Renford Dills, MD;  Location: [facility] INVASIVE CV LAB;  Service: Cardiovascular;  Laterality: N/A;  . ESOPHAGOGASTRODUODENOSCOPY N/A 04/07/2015   Procedure: ESOPHAGOGASTRODUODENOSCOPY (EGD);  Surgeon: Midge Minium, MD;  Location: [facility] ENDOSCOPY;  Service: Endoscopy;  Laterality: N/A;  . ESOPHAGOGASTRODUODENOSCOPY (EGD) WITH PROPOFOL Left 06/30/2015   Procedure: ESOPHAGOGASTRODUODENOSCOPY (EGD) WITH PROPOFOL;  Surgeon: Christena Deem, MD;  Location: [facility] ENDOSCOPY;  Service: Endoscopy;  Laterality: Left;  . FOOT SURGERY    . I&D EXTREMITY Left 04/06/2015   Procedure: IRRIGATION AND DEBRIDEMENT EXTREMITY;  Surgeon: Gwyneth Revels, DPM;  Location: [facility] ORS;  Service: Podiatry;  Laterality: Left;  . IRRIGATION AND DEBRIDEMENT FOOT Left 05/21/2015   Procedure: IRRIGATION AND DEBRIDEMENT FOOT;  Surgeon: Linus Galas, MD;  Location: [facility] ORS;  Service: Podiatry;  Laterality: Left;  . IRRIGATION AND DEBRIDEMENT KNEE Right 10/18/2016   Procedure: IRRIGATION AND DEBRIDEMENT KNEE;  Surgeon: Deeann Saint, MD;  Location: [facility] ORS;  Service: Orthopedics;  Laterality: Right;  . KNEE ARTHROSCOPY  10/18/2016   Procedure: ARTHROSCOPY KNEE;  Surgeon: Deeann Saint, MD;  Location: [facility] ORS;  Service: Orthopedics;;  . PERIPHERAL VASCULAR CATHETERIZATION N/A 10/27/2016   Procedure: Dialysis/Perma Catheter Insertion;  Surgeon: Annice Needy, MD;  Location: [facility] INVASIVE CV LAB;  Service: Cardiovascular;  Laterality: N/A;    Social History Social History  Substance Use Topics  . Smoking status: Never Smoker  . Smokeless tobacco: Never Used  . Alcohol use No    Family History Family History  Problem Relation Age of Onset  . Coronary artery disease Father   . Pancreatic cancer Mother   . Breast cancer Sister   . Lung  cancer Brother   . Cervical cancer Sister     No family history of bleeding or clotting disorders, autoimmune disease or porphyria  Allergies  Allergen Reactions  . Sulfur Other (See Comments)    Pt states that this medication causes renal failure.       REVIEW OF SYSTEMS (Negative unless checked)  Constitutional: Weight loss  Fever  Chills Cardiac: Chest pain   Chest pressure   Palpitations   Shortness of breath when laying flat   Shortness of breath at rest   Shortness of breath with  exertion. Vascular:  Pain in legs with walking   Pain in legs at rest   Pain in legs when laying flat   Claudication   Pain in feet when walking  Pain in feet at rest  Pain in feet when laying flat   History of DVT   Phlebitis   Swelling in legs   Varicose veins   Non-healing ulcers Pulmonary:   Uses home oxygen   Productive cough   Hemoptysis   Wheeze  COPD   Asthma Neurologic:  Dizziness  Blackouts   Seizures   History of stroke   History of TIA  Aphasia   Temporary blindness   Dysphagia   Weakness or numbness in arms   Weakness or numbness in legs Musculoskeletal:  Arthritis   Joint swelling   Joint pain   Low back pain Hematologic:  Easy bruising  Easy bleeding   Hypercoagulable state   Anemic  Hepatitis Gastrointestinal:  Blood in stool   Vomiting blood  Gastroesophageal reflux/heartburn   Difficulty swallowing. Genitourinary:  Chronic kidney disease   Difficult urination  Frequent urination  Burning with urination   Blood in urine Skin:  Rashes   Ulcers   Wounds Psychological:  History of anxiety    History of major depression.  Physical Examination  Vitals:   01/16/17 1342 01/16/17 1344 01/16/17 1346 01/16/17 2012  BP: 105/62  102/62 (!) 101/56  Pulse: (!) 134 67 68 70  Resp: Temp: 97.9 F (36.6 C)  97.9 F (36.6 C) 98.4 F (36.9 C)  TempSrc: Oral  Oral Oral   SpO2: 90% 96% 98% 98%  Weight:      Height:       Body mass index is 23.88 kg/m. Gen: WD/WN, NAD Head: [place]/AT, No temporalis wasting. Prominent temp pulse not noted. Ear/Nose/Throat: Hearing grossly intact, nares w/o erythema or drainage, oropharynx w/o Erythema/Exudate,  Eyes: Conjunctiva clear, sclera non-icteric Neck: Trachea midline.  No JVD.  Pulmonary:  Good air movement, respirations not labored, no use of accessory muscles.  Cardiac: RRR, normal S1, S2. Vascular:  Vessel Right Left  Radial Palpable Palpable  Ulnar Not Palpable Not Palpable  Brachial Palpable Palpable  Carotid Palpable, without bruit Palpable, without bruit  Gastrointestinal: soft, non-tender/non-distended. No guarding/reflex.  Musculoskeletal: M/S 5/5 throughout.  Extremities without ischemic changes.  No deformity or atrophy.  Neurologic: Sensation grossly intact in extremities.  Symmetrical.  Speech is fluent. Motor exam as listed above. Psychiatric: Judgment intact, Mood & affect appropriate for pt's clinical situation. Dermatologic: Multiple tattoos present both arms as well as chest No rashes or ulcers noted.  No cellulitis or open wounds. Lymph : No Cervical, Axillary, or Inguinal lymphadenopathy.   CBC Lab Results  Component Value Date   WBC 6.2 01/14/2017   HGB 9.0 (L) 01/14/2017   HCT 28.6 (L) 01/14/2017   MCV 80.2 01/14/2017   PLT 332 01/14/2017    BMET    Component Value Date/Time   NA 129 (L) 01/15/2017 0351   NA 140 09/18/2014 1003   K 4.9 01/15/2017 0351   K 5.6 (H) 09/18/2014 1003   CL 96 (L) 01/15/2017 0351   CL 111 (H) 09/18/2014 1003   CO2 23 01/15/2017 0351   CO2 22 09/18/2014 1003   GLUCOSE 184 (H) 01/15/2017 0351   GLUCOSE 270 (H) 09/18/2014 1003   BUN 141 (H) 01/15/2017 0351   BUN 52 (H) 09/18/2014 1003   CREATININE 4.73 (H) 01/15/2017 4098  CREATININE 2.50 (H) 09/18/2014 1003   CALCIUM 8.5 (L) 01/15/2017 0351   CALCIUM 9.0 09/18/2014 1003   GFRNONAA 13 (L)  01/15/2017 0351   GFRNONAA 30 (L) 09/18/2014 1003   GFRNONAA 38 (L) 03/31/2014 2147   GFRAA 16 (L) 01/15/2017 0351   GFRAA 36 (L) 09/18/2014 1003   GFRAA 44 (L) 03/31/2014 2147   Estimated Creatinine Clearance: 22.4 mL/min (A) (by C-G formula based on SCr of 4.73 mg/dL (H)).  COAG Lab Results  Component Value Date   INR 2.18 10/18/2016   INR 2.05 10/17/2016   INR 2.16 09/25/2016    Radiology Ct Abdomen Pelvis Wo Contrast  Result Date: 01/09/2017 CLINICAL DATA:  Mid abdominal pain for 3 days with nausea and bloating. EXAM: CT ABDOMEN AND PELVIS WITHOUT CONTRAST TECHNIQUE: Multidetector CT imaging of the abdomen and pelvis was performed following the standard protocol without IV contrast. COMPARISON:  04/01/2014 FINDINGS: Lower chest: Large right pleural effusion is identified. There is subsegmental atelectasis involving the right middle lobe and right lower lobe. Hepatobiliary: No focal liver abnormality. No biliary dilatation. The gallbladder is unremarkable. Pancreas: Unremarkable. No pancreatic ductal dilatation or surrounding inflammatory changes. Spleen: Normal in size without focal abnormality. Adrenals/Urinary Tract: The adrenal glands are normal. Unremarkable appearance of the kidneys. Urinary bladder appears normal. Stomach/Bowel: The stomach appears normal. The small bowel loops have a normal course and caliber. No pathologic dilatation of the colon. Vascular/Lymphatic: Aortic atherosclerosis. No enlarged upper abdominal lymph nodes. No pelvic or inguinal adenopathy. Reproductive: Prostate is unremarkable. Other: Large volume of ascites is identified within the abdomen and pelvis. Musculoskeletal: Degenerative disc disease identified within the lower thoracic and lumbar spine. No aggressive lytic or sclerotic bone lesions. IMPRESSION: 1. Large right pleural effusion. 2. Large volume of ascites identified within the abdomen and pelvis. 3. Aortic atherosclerosis. Electronically Signed    By: Signa Kell M.D.   On: 01/09/2017 13:49   US Paracentesis  Result Date: 01/15/2017 INDICATION: Recurrent ascites, abdominal pain, distention, concern for bacterial peritonitis. EXAM: ULTRASOUND GUIDED PARACENTESIS MEDICATIONS: 1% lidocaine locally COMPLICATIONS: None immediate. PROCEDURE: An ultrasound guided paracentesis was thoroughly discussed with the patient and questions answered. The benefits, risks, alternatives and complications were also discussed. The patient understands and wishes to proceed with the procedure. Written consent was obtained. Ultrasound was performed to localize and mark an adequate pocket of fluid in the right lower quadrant of the abdomen. The area was then prepped and draped in the normal sterile fashion. 1% Lidocaine was used for local anesthesia. Under ultrasound guidance a safety centesis needle catheter was introduced. Paracentesis was performed. The catheter was removed and a dressing applied. FINDINGS: A total of approximately 5.35 L of amber colored peritoneal fluid was removed. A fluid sample was sent for culture. IMPRESSION: Successful ultrasound guided paracentesis yielding 5.35 L of ascites. Electronically Signed   By: Judie Petit.  Shick M.D.   On: 01/15/2017 10:49    Assessment/Plan 1.  Severe congestive heart failure with volume overload:  The patient will once again be initiated on hemodialysis. The plan is for a permacatheter to be placed tomorrow. The risks and benefits of been reviewed all questions answered patient agrees to proceed. 2.  End-stage renal disease requiring hemodialysis:  Patient will restart dialysis therapy without further interruption. Dialysis has already been arranged. 3.  Hypertension:  Patient will continue medical management; nephrology is following no changes in oral medications. 4. Diabetes mellitus:  Glucose will be monitored and oral medications been held this morning once  the patient has undergone the patient's procedure po intake  will be reinitiated and again Accu-Cheks will be used to assess the blood glucose level and treat as needed. The patient will be restarted on the patient's usual hypoglycemic regime 5.  Coronary artery disease:  EKG will be monitored. Nitrates will be used if needed. The patient's oral cardiac medications will be continued.    Levora Dredge, MD  01/16/2017 8:54 PM

## 2017-01-16 NOTE — Progress Notes (Signed)
Subjective:  Patient is known to our practice from previous admissions and outpatient.  He had presented to the emergency room on Wednesday with abdominal pain.  He was started on antibiotics for SBP.  He left because he said he had an appointment with his eye doctor. He presents again for generalized abdominal pain and distention He underwent abdominal paracentesis 4/16.  5.3 L of peritoneal fluid was removed Cytology shows total WBC count of 800, 51% neutrophils  doing fair today   Objective:  Vital signs in last 24 hours:  Temp:  [97.9 F (36.6 C)-98.4 F (36.9 C)] 97.9 F (36.6 C) (04/17 1346) Pulse Rate:  [67-134] 68 (04/17 1346) Resp:  [16-18] 16 (04/17 1346) BP: (93-106)/(57-64) 102/62 (04/17 1346) SpO2:  [90 %-98 %] 98 % (04/17 1346)  Weight change:  Filed Weights   01/14/17 0135  Weight: 84.4 kg (186 lb)    Intake/Output:    Intake/Output Summary (Last 24 hours) at 01/16/17 1825 Last data filed at 01/16/17 1500  Gross per 24 hour  Intake              533 ml  Output                0 ml  Net              533 ml     Physical Exam: General: No acute distress, laying in the bed  HEENT Moist oral mucous membranes  Neck supple  Pulm/lungs Normal breathing effort, clear to auscultation  CVS/Heart No rub or gallop  Abdomen:  Soft, nontender, mildly distended  Extremities: No peripheral edema  Neurologic: Alert, oriented  Skin: Multiple tattoos, normal turgor          Basic Metabolic Panel:   Recent Labs Lab 01/14/17 0203 01/15/17 0351  NA 131* 129*  K 4.2 4.9  CL 96* 96*  CO2 24 23  GLUCOSE 139* 184*  BUN 139* 141*  CREATININE 4.72* 4.73*  CALCIUM 9.3 8.5*  PHOS  --  6.5*     CBC:  Recent Labs Lab 01/14/17 0203  WBC 6.2  HGB 9.0*  HCT 28.6*  MCV 80.2  PLT 332      Microbiology:  Recent Results (from the past 720 hour(s))  Body fluid culture     Status: None (Preliminary result)   Collection Time: 01/15/17 10:05 AM  Result  Value Ref Range Status   Specimen Description PERITONEAL  Final   Special Requests NONE  Final   Gram Stain   Final    RARE WBC PRESENT, PREDOMINANTLY MONONUCLEAR NO ORGANISMS SEEN    Culture   Final    NO GROWTH < 24 HOURS Performed at [facility], [address], [place], [place] [address]    Report Status PENDING  Incomplete  MRSA PCR Screening     Status: None   Collection Time: 01/15/17 12:48 PM  Result Value Ref Range Status   MRSA by PCR NEGATIVE NEGATIVE Final    Comment:        The GeneXpert MRSA Assay (FDA approved for NASAL specimens only), is one component of a comprehensive MRSA colonization surveillance program. It is not intended to diagnose MRSA infection nor to guide or monitor treatment for MRSA infections.     Coagulation Studies: No results for input(s): LABPROT, INR in the last 72 hours.  Urinalysis:  Recent Labs  01/14/17 1100  COLORURINE YELLOW*  LABSPEC 1.011  PHURINE 5.0  GLUCOSEU NEGATIVE  HGBUR NEGATIVE  BILIRUBINUR NEGATIVE  KETONESUR NEGATIVE  PROTEINUR NEGATIVE  NITRITE NEGATIVE  LEUKOCYTESUR NEGATIVE      Imaging: US Paracentesis  Result Date: 01/15/2017 INDICATION: Recurrent ascites, abdominal pain, distention, concern for bacterial peritonitis. EXAM: ULTRASOUND GUIDED PARACENTESIS MEDICATIONS: 1% lidocaine locally COMPLICATIONS: None immediate. PROCEDURE: An ultrasound guided paracentesis was thoroughly discussed with the patient and questions answered. The benefits, risks, alternatives and complications were also discussed. The patient understands and wishes to proceed with the procedure. Written consent was obtained. Ultrasound was performed to localize and mark an adequate pocket of fluid in the right lower quadrant of the abdomen. The area was then prepped and draped in the normal sterile fashion. 1% Lidocaine was used for local anesthesia. Under ultrasound guidance a safety centesis needle catheter was introduced.  Paracentesis was performed. The catheter was removed and a dressing applied. FINDINGS: A total of approximately 5.35 L of amber colored peritoneal fluid was removed. A fluid sample was sent for culture. IMPRESSION: Successful ultrasound guided paracentesis yielding 5.35 L of ascites. Electronically Signed   By: Judie Petit.  Shick M.D.   On: 01/15/2017 10:49     Medications:   . cefTRIAXone (ROCEPHIN)  IV 2 g (01/16/17 0430)   . amiodarone  200 mg Oral Daily  . carvedilol  3.125 mg Oral BID WC  . feeding supplement (NEPRO CARB STEADY)  237 mL Oral BID BM  . fluticasone furoate-vilanterol  1 puff Inhalation Daily  . insulin aspart  0-5 Units Subcutaneous QHS  . insulin aspart  0-9 Units Subcutaneous TID WC  . insulin aspart  4 Units Subcutaneous TID WC  . methimazole  10 mg Oral Daily  . pantoprazole  40 mg Oral Daily  . sodium chloride flush  3 mL Intravenous Q12H   acetaminophen **OR** acetaminophen, bisacodyl, morphine injection, ondansetron **OR** ondansetron (ZOFRAN) IV, traMADol, zolpidem  Assessment/ Plan:  46 y.o.caucsian  male  with hypertension, diabetes mellitus type 2, chronic kidney disease stage III, peripheral neuropathy  1. Acute renal failure on Chronic kidney disease stage IV Patient has likely progressed to end-stage renal disease.  He has mild uremic symptoms in the form of poor appetite, nausea and vomiting. Discussed restarting hemodialysis.  Patient has agreed to it.  We'll place a consult to vascular surgery for PermCath placement.  We will start his dialysis as soon as access is available  2. Peritonitis with ascites - Currently treated with IV Rocephin  3. Anemia of chronic kidney disease: hemoglobin 9.0 iron studies, EPO   LOS: 2 Willis Holquin 4/17/20186:25 PM

## 2017-01-16 NOTE — Progress Notes (Signed)
SUBJECTIVE: Patient doesn't feel well complaining of abdominal pain and fatigue and dizziness   Vitals:   01/15/17 1800 01/15/17 2131 01/15/17 2300 01/16/17 0530  BP: (!) 98/59 (!) 93/57 102/61 106/64  Pulse: 94 84 70 67  Resp:  16  18  Temp:  98.4 F (36.9 C)  98.4 F (36.9 C)  TempSrc:  Oral  Oral  SpO2:  94%  95%  Weight:      Height:        Intake/Output Summary (Last 24 hours) at 01/16/17 0859 Last data filed at 01/15/17 2152  Gross per 24 hour  Intake               63 ml  Output               75 ml  Net              -12 ml    LABS: Basic Metabolic Panel:  Recent Labs  16/10/96 0203 01/15/17 0351  NA 131* 129*  K 4.2 4.9  CL 96* 96*  CO2 24 23  GLUCOSE 139* 184*  BUN 139* 141*  CREATININE 4.72* 4.73*  CALCIUM 9.3 8.5*  PHOS  --  6.5*   Liver Function Tests:  Recent Labs  01/14/17 0203 01/15/17 0351  AST 15  --   ALT 10*  --   ALKPHOS 66  --   BILITOT 0.9  --   PROT 8.4*  --   ALBUMIN 2.8* 2.4*    Recent Labs  01/14/17 0203  LIPASE 23   CBC:  Recent Labs  01/14/17 0203  WBC 6.2  HGB 9.0*  HCT 28.6*  MCV 80.2  PLT 332   Cardiac Enzymes: No results for input(s): CKTOTAL, CKMB, CKMBINDEX, TROPONINI in the last 72 hours. BNP: Invalid input(s): POCBNP D-Dimer: No results for input(s): DDIMER in the last 72 hours. Hemoglobin A1C: No results for input(s): HGBA1C in the last 72 hours. Fasting Lipid Panel: No results for input(s): CHOL, HDL, LDLCALC, TRIG, CHOLHDL, LDLDIRECT in the last 72 hours. Thyroid Function Tests: No results for input(s): TSH, T4TOTAL, T3FREE, THYROIDAB in the last 72 hours.  Invalid input(s): FREET3 Anemia Panel: No results for input(s): VITAMINB12, FOLATE, FERRITIN, TIBC, IRON, RETICCTPCT in the last 72 hours.   PHYSICAL EXAM General: Well developed, well nourished, in no acute distress HEENT:  Normocephalic and atramatic Neck:  No JVD.  Lungs: Clear bilaterally to auscultation and percussion. Heart:  HRRR . Normal S1 and S2 without gallops or murmurs.  Abdomen: Bowel sounds are positive, abdomen soft and non-tender  Msk:  Back normal, normal gait. Normal strength and tone for age. Extremities: No clubbing, cyanosis or edema.   Neuro: Alert and oriented X 3. Psych:  Good affect, responds appropriately  TELEMETRY:A. fib with controlled ventricular rate  ASSESSMENT AND PLAN: Atrial fibrillation with controlled ventricular rate and left reticular ejection fraction approximately 25-30% on echocardiogram with 4 chamber dilatation. Patient has renal failure thus cannot start entersto, and is not a candidate for anticoagulation due to peritonitis. Patient is being set up for dialysis. Atrial fibrillation rate is well controlled.  Active Problems:   Acute on chronic renal failure (HCC)   CHF exacerbation (HCC)    Saquoia Sianez A, MD, [facility] 01/16/2017 8:59 AM

## 2017-01-17 ENCOUNTER — Encounter: Payer: Self-pay | Admitting: *Deleted

## 2017-01-17 ENCOUNTER — Inpatient Hospital Stay
Admission: EM | Disposition: A | Discharge status: Home / Self Care | Payer: Self-pay | Source: Home / Self Care | Attending: Internal Medicine

## 2017-01-17 DIAGNOSIS — N186 End stage renal disease: Secondary | ICD-10-CM

## 2017-01-17 HISTORY — PX: DIALYSIS/PERMA CATHETER INSERTION: CATH118288

## 2017-01-17 LAB — GLUCOSE, CAPILLARY
GLUCOSE-CAPILLARY: 173 mg/dL — AB (ref 65–99)
GLUCOSE-CAPILLARY: 162 mg/dL — AB (ref 65–99)
GLUCOSE-CAPILLARY: 158 mg/dL — AB (ref 65–99)
GLUCOSE-CAPILLARY: 165 mg/dL — AB (ref 65–99)
Glucose-Capillary: 173 mg/dL — ABNORMAL HIGH (ref 65–99)

## 2017-01-17 SURGERY — DIALYSIS/PERMA CATHETER INSERTION
Anesthesia: Moderate Sedation

## 2017-01-17 MED ORDER — LIDOCAINE-EPINEPHRINE (PF) 2 %-1:200000 IJ SOLN
INTRAMUSCULAR | Status: AC
  Filled 2017-01-17: qty 20

## 2017-01-17 MED ORDER — MIDAZOLAM HCL 2 MG/2ML IJ SOLN
INTRAMUSCULAR | Status: DC | PRN
  Administered 2017-01-17: 2 mg via INTRAVENOUS

## 2017-01-17 MED ORDER — CEFAZOLIN SODIUM 10 G IJ SOLR
2.0000 g | Freq: Once | INTRAMUSCULAR | Status: AC
  Administered 2017-01-17: 2 g via INTRAVENOUS
  Filled 2017-01-17: qty 2000

## 2017-01-17 MED ORDER — HEPARIN SODIUM (PORCINE) 10000 UNIT/ML IJ SOLN
INTRAMUSCULAR | Status: AC
  Filled 2017-01-17: qty 1

## 2017-01-17 MED ORDER — FENTANYL CITRATE (PF) 100 MCG/2ML IJ SOLN
INTRAMUSCULAR | Status: DC | PRN
  Administered 2017-01-17: 50 ug via INTRAVENOUS

## 2017-01-17 SURGICAL SUPPLY — 7 items
CATH PALINDROME RT-P 15FX23CM (CATHETERS) ×3 IMPLANT
DERMABOND ADVANCED (GAUZE/BANDAGES/DRESSINGS) ×2
DERMABOND ADVANCED .7 DNX12 (GAUZE/BANDAGES/DRESSINGS) ×1 IMPLANT
PACK ANGIOGRAPHY (CUSTOM PROCEDURE TRAY) ×3 IMPLANT
SUT MNCRL AB 4-0 PS2 18 (SUTURE) ×3 IMPLANT
SUT PROLENE 0 CT 1 30 (SUTURE) ×3 IMPLANT
WIRE J 3MM .035X145CM (WIRE) ×3 IMPLANT

## 2017-01-17 NOTE — Progress Notes (Signed)
Hd start 

## 2017-01-17 NOTE — H&P (Signed)
[facility] History & Physical Update  The patient was interviewed and re-examined.  The patient's previous History and Physical has been reviewed and is unchanged.  There is no change in the plan of care. We plan to proceed with the scheduled procedure.  Festus Barren, MD  01/17/2017, 3:24 PM

## 2017-01-17 NOTE — Progress Notes (Signed)
Subjective:  Patient is known to our practice from previous admissions and outpatient.  He had presented to the emergency room on Wednesday with abdominal pain.  He was started on antibiotics for SBP.  He left because he said he had an appointment with his eye doctor. He presents again for generalized abdominal pain and distention He underwent abdominal paracentesis 4/16.  5.3 L of peritoneal fluid was removed Cytology shows total WBC count of 800, 51% neutrophils  doing fair today Waiting permcath placement  Objective:  Vital signs in last 24 hours:  Temp:  [97.7 F (36.5 C)-98.4 F (36.9 C)] 98 F (36.7 C) (04/18 1346) Pulse Rate:  [64-84] 74 (04/18 1710) Resp:  [13-20] 16 (04/18 1710) BP: (101-140)/(56-87) 125/73 (04/18 1710) SpO2:  [95 %-100 %] 100 % (04/18 1710)  Weight change:  Filed Weights   01/14/17 0135  Weight: 84.4 kg (186 lb)    Intake/Output:    Intake/Output Summary (Last 24 hours) at 01/17/17 1737 Last data filed at 01/17/17 1533  Gross per 24 hour  Intake              100 ml  Output              400 ml  Net             -300 ml     Physical Exam: General: No acute distress, laying in the bed  HEENT Moist oral mucous membranes  Neck supple  Pulm/lungs Normal breathing effort, clear to auscultation  CVS/Heart No rub or gallop  Abdomen:  Soft, nontender, mildly distended  Extremities: No peripheral edema  Neurologic: Alert, oriented  Skin: Multiple tattoos, normal turgor          Basic Metabolic Panel:   Recent Labs Lab 01/14/17 0203 01/15/17 0351  NA 131* 129*  K 4.2 4.9  CL 96* 96*  CO2 24 23  GLUCOSE 139* 184*  BUN 139* 141*  CREATININE 4.72* 4.73*  CALCIUM 9.3 8.5*  PHOS  --  6.5*     CBC:  Recent Labs Lab 01/14/17 0203  WBC 6.2  HGB 9.0*  HCT 28.6*  MCV 80.2  PLT 332      Microbiology:  Recent Results (from the past 720 hour(s))  Body fluid culture     Status: None (Preliminary result)   Collection Time:  01/15/17 10:05 AM  Result Value Ref Range Status   Specimen Description PERITONEAL  Final   Special Requests NONE  Final   Gram Stain   Final    RARE WBC PRESENT, PREDOMINANTLY MONONUCLEAR NO ORGANISMS SEEN    Culture   Final    NO GROWTH 2 DAYS Performed at [facility], [address] [place], [place] [address]    Report Status PENDING  Incomplete  MRSA PCR Screening     Status: None   Collection Time: 01/15/17 12:48 PM  Result Value Ref Range Status   MRSA by PCR NEGATIVE NEGATIVE Final    Comment:        The GeneXpert MRSA Assay (FDA approved for NASAL specimens only), is one component of a comprehensive MRSA colonization surveillance program. It is not intended to diagnose MRSA infection nor to guide or monitor treatment for MRSA infections.     Coagulation Studies: No results for input(s): LABPROT, INR in the last 72 hours.  Urinalysis: No results for input(s): COLORURINE, LABSPEC, PHURINE, GLUCOSEU, HGBUR, BILIRUBINUR, KETONESUR, PROTEINUR, UROBILINOGEN, NITRITE, LEUKOCYTESUR in the last 72 hours.  Invalid input(s): APPERANCEUR    Imaging: No results found.   Medications:   . cefTRIAXone (ROCEPHIN)  IV Stopped (01/17/17 0450)   . amiodarone  200 mg Oral Daily  . carvedilol  3.125 mg Oral BID WC  . feeding supplement (NEPRO CARB STEADY)  237 mL Oral BID BM  . fluticasone furoate-vilanterol  1 puff Inhalation Daily  . insulin aspart  0-5 Units Subcutaneous QHS  . insulin aspart  0-9 Units Subcutaneous TID WC  . insulin aspart  4 Units Subcutaneous TID WC  . methimazole  10 mg Oral Daily  . pantoprazole  40 mg Oral Daily  . sodium chloride flush  3 mL Intravenous Q12H   acetaminophen **OR** acetaminophen, bisacodyl, morphine injection, ondansetron **OR** ondansetron (ZOFRAN) IV, traMADol, zolpidem  Assessment/ Plan:  46 y.o.caucsian  male  with hypertension, diabetes mellitus type 2, chronic kidney disease stage III, peripheral neuropathy  1.  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  He has mild uremic symptoms in the form of poor appetite, nausea and vomiting. Restart dialysis Patient requests placement at [facility] unit as it is 2 min from where he lives  2. Peritonitis with ascites - Currently treated with IV Rocephin  3. Anemia of chronic kidney disease: hemoglobin 9.0 iron studies, EPO   LOS: 3 Elgie Landino 4/18/20185:37 PM

## 2017-01-17 NOTE — Progress Notes (Signed)
SUBJECTIVE: Pt is feeling well, no chest pain or SOB.   Vitals:   01/17/17 0451 01/17/17 0752 01/17/17 1026 01/17/17 1225  BP: 102/65 105/64 117/72 111/68  Pulse: 69 64 66 70  Resp: 20     Temp: 97.9 F (36.6 C)     TempSrc: Oral     SpO2: 97%   96%  Weight:      Height:        Intake/Output Summary (Last 24 hours) at 01/17/17 1319 Last data filed at 01/16/17 2000  Gross per 24 hour  Intake               50 ml  Output              200 ml  Net             -150 ml    LABS: Basic Metabolic Panel:  Recent Labs  16/10/96 0351  NA 129*  K 4.9  CL 96*  CO2 23  GLUCOSE 184*  BUN 141*  CREATININE 4.73*  CALCIUM 8.5*  PHOS 6.5*   Liver Function Tests:  Recent Labs  01/15/17 0351  ALBUMIN 2.4*   No results for input(s): LIPASE, AMYLASE in the last 72 hours. CBC: No results for input(s): WBC, NEUTROABS, HGB, HCT, MCV, PLT in the last 72 hours. Cardiac Enzymes: No results for input(s): CKTOTAL, CKMB, CKMBINDEX, TROPONINI in the last 72 hours. BNP: Invalid input(s): POCBNP D-Dimer: No results for input(s): DDIMER in the last 72 hours. Hemoglobin A1C: No results for input(s): HGBA1C in the last 72 hours. Fasting Lipid Panel: No results for input(s): CHOL, HDL, LDLCALC, TRIG, CHOLHDL, LDLDIRECT in the last 72 hours. Thyroid Function Tests: No results for input(s): TSH, T4TOTAL, T3FREE, THYROIDAB in the last 72 hours.  Invalid input(s): FREET3 Anemia Panel: No results for input(s): VITAMINB12, FOLATE, FERRITIN, TIBC, IRON, RETICCTPCT in the last 72 hours.   PHYSICAL EXAM General: Well developed, well nourished, in no acute distress HEENT:  Normocephalic and atramatic Neck:  No JVD.  Lungs: Clear bilaterally to auscultation and percussion. Heart: HRRR . Normal S1 and S2 without gallops or murmurs.  Abdomen: Bowel sounds are positive, abdomen soft and non-tender  Extremities: No clubbing, cyanosis or edema.   Neuro: Alert and oriented X 3. Psych:  Good  affect, responds appropriately  TELEMETRY: Afib with vent rate of 67bpm, BBB noted  ASSESSMENT AND PLAN: Afib with controlled vent rate. Not a candidate for anticoagulation due to peritonitis. Continue coreg and monitor BP.   Active Problems:   Acute on chronic renal failure (HCC)   CHF exacerbation (HCC)    Caroleen Hamman, NP-C 01/17/2017 1:19 PM

## 2017-01-17 NOTE — Op Note (Signed)
OPERATIVE NOTE    PRE-OPERATIVE DIAGNOSIS: 1. ESRD   POST-OPERATIVE DIAGNOSIS: same as above  PROCEDURE: 1. Ultrasound guidance for vascular access to the left internal jugular vein 2. Fluoroscopic guidance for placement of catheter 3. Placement of a 23 cm tip to cuff tunneled hemodialysis catheter via the left internal jugular vein  SURGEON: Festus Barren, MD  ANESTHESIA:  Local with Moderate conscious sedation for approximately 15 minutes using 2 mg of Versed and 50 mcg of Fentanyl  ESTIMATED BLOOD LOSS: 10 cc  FLUORO TIME: less than one minute  CONTRAST: none  FINDING(S): 1.  Patent left internal jugular vein  SPECIMEN(S):  None  INDICATIONS:   Juan Roberson. is a 46 y.o. male who presents with ESRD.  The patient needs long term dialysis access for their ESRD, and a Permcath is necessary.  Risks and benefits are discussed and informed consent is obtained.    DESCRIPTION: After obtaining full informed written consent, the patient was brought back to the vascular suited. The patient's left neck and chest were sterilely prepped and draped in a sterile surgical field was created. Moderate conscious sedation was administered during a face to face encounter with the patient throughout the procedure with my supervision of the RN administering medicines and monitoring the patient's vital signs, pulse oximetry, telemetry and mental status throughout from the start of the procedure until the patient was taken to the recovery room.  The left internal jugular vein was visualized with ultrasound and found to be patent. It was then accessed under direct ultrasound guidance and a permanent image was recorded. A wire was placed. After skin nick and dilatation, the peel-away sheath was placed over the wire. I then turned my attention to an area under the clavicle. Approximately 1-2 fingerbreadths below the clavicle a small counterincision was created and tunneled from the subclavicular incision  to the access site. Using fluoroscopic guidance, a 23 centimeter tip to cuff tunneled hemodialysis catheter was selected, and tunneled from the subclavicular incision to the access site. It was then placed through the peel-away sheath and the peel-away sheath was removed. Using fluoroscopic guidance the catheter tips were parked in the right atrium. The appropriate distal connectors were placed. It withdrew blood well and flushed easily with heparinized saline and a concentrated heparin solution was then placed. It was secured to the chest wall with 2 Prolene sutures. The access incision was closed single 4-0 Monocryl. A 4-0 Monocryl pursestring suture was placed around the exit site. Sterile dressings were placed. The patient tolerated the procedure well and was taken to the recovery room in stable condition.  COMPLICATIONS: None  CONDITION: Stable  Festus Barren  01/17/2017, 4:02 PM   This note was created with Dragon Medical transcription system. Any errors in dictation are purely unintentional.

## 2017-01-17 NOTE — Progress Notes (Signed)
Pre hd info 

## 2017-01-17 NOTE — Progress Notes (Signed)
Pre hd assessment  

## 2017-01-17 NOTE — Progress Notes (Signed)
  End of hd 

## 2017-01-17 NOTE — Progress Notes (Signed)
[facility] [place] [facility]   PATIENT NAME: Juan Roberson    MR#:  [id number]  DATE OF BIRTH:  20-May-1971  SUBJECTIVE:  Feels better after paracentesis No new complaints REVIEW OF SYSTEMS:   Review of Systems  Constitutional: Negative for chills, fever and weight loss.  HENT: Negative for ear discharge, ear pain and nosebleeds.   Eyes: Negative for blurred vision, pain and discharge.  Respiratory: Positive for shortness of breath. Negative for sputum production, wheezing and stridor.   Cardiovascular: Positive for chest pain. Negative for palpitations, orthopnea and PND.  Gastrointestinal: Positive for abdominal pain and nausea. Negative for diarrhea and vomiting.  Genitourinary: Negative for frequency and urgency.  Musculoskeletal: Negative for back pain and joint pain.  Neurological: Positive for weakness. Negative for sensory change, speech change and focal weakness.  Psychiatric/Behavioral: Negative for depression and hallucinations. The patient is not nervous/anxious.    Tolerating Diet:some Tolerating PT: not needed  DRUG ALLERGIES:   Allergies  Allergen Reactions  . Sulfur Other (See Comments)    Pt states that this medication causes renal failure.      VITALS:  Blood pressure 125/73, pulse 74, temperature 98 F (36.7 C), temperature source Oral, resp. rate 16, height  (1.88 m), weight 84.4 kg (186 lb), SpO2 100 %.  PHYSICAL EXAMINATION:   Physical Exam  GENERAL:  47 y.o.-year-old patient lying in the bed with no acute distress. Appears chronically ill EYES: Pupils equal, round, reactive to light and accommodation. No scleral icterus. Extraocular muscles intact.  HEENT: Head atraumatic, normocephalic. Oropharynx and nasopharynx clear.  NECK:  Supple, no jugular venous distention. No thyroid enlargement, no tenderness.  LUNGS: Decreased breath sounds bilaterally, no wheezing, rales, rhonchi. No use of accessory muscles of  respiration.  CARDIOVASCULAR: S1, S2 normal. No murmurs, rubs, or gallops.  ABDOMEN: Soft, nontender, distended, ascites. Bowel sounds present. No organomegaly or mass.  EXTREMITIES: No cyanosis, clubbing or edema b/l.    NEUROLOGIC: Cranial nerves II through XII are intact. No focal Motor or sensory deficits b/l.   PSYCHIATRIC:  patient is alert and oriented x 3.  SKIN: No obvious rash, lesion, or ulcer. tattoo ++  LABORATORY PANEL:  CBC  Recent Labs Lab 01/14/17 0203  WBC 6.2  HGB 9.0*  HCT 28.6*  PLT 332    Chemistries   Recent Labs Lab 01/14/17 0203 01/15/17 0351  NA 131* 129*  K 4.2 4.9  CL 96* 96*  CO2 24 23  GLUCOSE 139* 184*  BUN 139* 141*  CREATININE 4.72* 4.73*  CALCIUM 9.3 8.5*  AST 15  --   ALT 10*  --   ALKPHOS 66  --   BILITOT 0.9  --    Cardiac Enzymes No results for input(s): TROPONINI in the last 168 hours. RADIOLOGY:  No results found. ASSESSMENT AND PLAN:  46 year old male with past medical history of chronic kidney disease stage IV, hypertension, hyperlipidemia, severe ischemic cardiomyopathy ejection fraction of 10%, chronic systolic CHF, GERD, who presents to the hospital due to abdominal pain and noted to be in acute on chronic renal failure.  1. Abdominal pain- due to SBP - His CT scan does show a large volume ascites. - US guided paracentesis >5 liters fluid removed. WBC 805 c/w SBP -Continue IV ceftriaxone   2. Acute on chronic renal failure-patient's BUN and creatinine is elevated from baseline. In March patient's creatinine was 3.7 currently elevated at 4.72 and BUN up to 138. - will get  a nephrology consult. Patient has been on dialysis in the past. Hold Lasix and Aldactone for now. -he does not appear to be volume overloaded. -d/w dr Thedore Mins.  -appreciate vascular help. Pt is s/p HD tunneled cath placement. HD to be STARTED on 01/17/2017  3. History of atrial fibrillation-rate controlled. Continue amiodarone.  4. COPD-no acute  exacerbation-continue Brio-Ellipta  5. GERD - cont. Protonix.   6. Hyperthyroidism - cont. Tapazole.    7. DM - cont. Novolog w/ meals.  - Carb control diet. Follow BS.   8. Severe Malnutrition in context of illness Follow dietiatin recs  Case discussed with Care Management/Social Worker. Management plans discussed with the patient, family and they are in agreement.  CODE STATUS: full  DVT Prophylaxis: lovneox  TOTAL TIME TAKING CARE OF THIS PATIENT: 25 minutes.  >50% time spent on counselling and coordination of care  POSSIBLE D/C IN few  DAYS, DEPENDING ON CLINICAL CONDITION.  Note: This dictation was prepared with Dragon dictation along with smaller phrase technology. Any transcriptional errors that result from this process are unintentional.  Alleyne Lac M.D on 01/17/2017 at 5:47 PM  Between 7am to 6pm - Pager - [phone]  After 6pm go to [email or web address] - Social research officer, government  [facility]  Office  [phone]  CC: Primary care physician; Corky Downs, MD

## 2017-01-17 NOTE — Plan of Care (Signed)
Problem: Pain Managment: Goal: General experience of comfort will improve Outcome: Progressing Patient has had no complaints of pain this shift.    

## 2017-01-17 NOTE — Progress Notes (Signed)
Post hd assessment 

## 2017-01-17 NOTE — Progress Notes (Signed)
Post hd vitals 

## 2017-01-18 ENCOUNTER — Encounter: Payer: Self-pay | Admitting: Vascular Surgery

## 2017-01-18 LAB — GLUCOSE, CAPILLARY
GLUCOSE-CAPILLARY: 178 mg/dL — AB (ref 65–99)
GLUCOSE-CAPILLARY: 204 mg/dL — AB (ref 65–99)
Glucose-Capillary: 98 mg/dL (ref 65–99)
Glucose-Capillary: 178 mg/dL — ABNORMAL HIGH (ref 65–99)

## 2017-01-18 LAB — BODY FLUID CULTURE: Culture: NO GROWTH

## 2017-01-18 MED ORDER — POLYETHYLENE GLYCOL 3350 17 G PO PACK: 17.0000 g | PACK | Freq: Every day | ORAL | Status: DC | PRN

## 2017-01-18 MED ORDER — TUBERCULIN PPD 5 UNIT/0.1ML ID SOLN
5.0000 [IU] | Freq: Once | INTRADERMAL | Status: DC
  Administered 2017-01-18: 5 [IU] via INTRADERMAL
  Filled 2017-01-18: qty 0.1

## 2017-01-18 MED ORDER — SENNOSIDES-DOCUSATE SODIUM 8.6-50 MG PO TABS
1.0000 | ORAL_TABLET | Freq: Two times a day (BID) | ORAL | Status: DC
  Administered 2017-01-18 (×2): 1 via ORAL
  Filled 2017-01-18 (×2): qty 1

## 2017-01-18 MED ORDER — CIPROFLOXACIN HCL 500 MG PO TABS
500.0000 mg | ORAL_TABLET | ORAL | Status: DC
  Administered 2017-01-18 – 2017-01-19 (×2): 500 mg via ORAL
  Filled 2017-01-18 (×2): qty 1

## 2017-01-18 MED ORDER — POLYETHYLENE GLYCOL 3350 17 G PO PACK: 17.0000 g | PACK | Freq: Every day | ORAL | Status: DC

## 2017-01-18 NOTE — Progress Notes (Signed)
[facility] - [place] [facility]   PATIENT NAME: Juan Roberson    MR#:  [id number]  DATE OF BIRTH:  1971-09-17  SUBJECTIVE:  No new complaints but some discomfort at the HD cath site  REVIEW OF SYSTEMS:   Review of Systems  Constitutional: Negative for chills, fever and weight loss.  HENT: Negative for ear discharge, ear pain and nosebleeds.   Eyes: Negative for blurred vision, pain and discharge.  Respiratory: Positive for shortness of breath. Negative for sputum production, wheezing and stridor.   Cardiovascular: Positive for chest pain. Negative for palpitations, orthopnea and PND.  Gastrointestinal: Positive for abdominal pain and nausea. Negative for diarrhea and vomiting.  Genitourinary: Negative for frequency and urgency.  Musculoskeletal: Negative for back pain and joint pain.  Neurological: Positive for weakness. Negative for sensory change, speech change and focal weakness.  Psychiatric/Behavioral: Negative for depression and hallucinations. The patient is not nervous/anxious.    Tolerating Diet:yes Tolerating PT: not needed  DRUG ALLERGIES:   Allergies  Allergen Reactions  . Sulfur Other (See Comments)    Pt states that this medication causes renal failure.      VITALS:  Blood pressure 108/67, pulse 68, temperature 98 F (36.7 C), temperature source Oral, resp. rate 18, height  (1.88 m), weight 83.2 kg (183 lb 6.8 oz), SpO2 98 %.  PHYSICAL EXAMINATION:   Physical Exam  GENERAL:  46 year-old patient lying in the bed lying in the bed with no acute distress. Appears chronically ill EYES: Pupils equal, round, reactive to light and accommodation. No scleral icterus. Extraocular muscles intact.  HEENT: Head atraumatic, normocephalic. Oropharynx and nasopharynx clear.  NECK:  Supple, no jugular venous distention. No thyroid enlargement, no tenderness.  LUNGS: Decreased breath sounds bilaterally, no wheezing, rales, rhonchi. No use of accessory  muscles of respiration. Left chest HD tunneled catheter  CARDIOVASCULAR: S1, S2 normal. No murmurs, rubs, or gallops.  ABDOMEN: Soft, nontender, distended, ascites. Bowel sounds present. No organomegaly or mass.  EXTREMITIES: No cyanosis, clubbing or edema b/l.    NEUROLOGIC: Cranial nerves II through XII are intact. No focal Motor or sensory deficits b/l.   PSYCHIATRIC:  patient is alert and oriented x 3.  SKIN: No obvious rash, lesion, or ulcer. tattoo ++  LABORATORY PANEL:  CBC  Recent Labs Lab 01/14/17 0203  WBC 6.2  HGB 9.0*  HCT 28.6*  PLT 332    Chemistries   Recent Labs Lab 01/14/17 0203 01/15/17 0351  NA 131* 129*  K 4.2 4.9  CL 96* 96*  CO2 24 23  GLUCOSE 139* 184*  BUN 139* 141*  CREATININE 4.72* 4.73*  CALCIUM 9.3 8.5*  AST 15  --   ALT 10*  --   ALKPHOS 66  --   BILITOT 0.9  --    Cardiac Enzymes No results for input(s): TROPONINI in the last 168 hours. RADIOLOGY:  No results found. ASSESSMENT AND PLAN:  46 year old male with past medical history of chronic kidney disease stage IV, hypertension, hyperlipidemia, severe ischemic cardiomyopathy ejection fraction of 10%, chronic systolic CHF, GERD, who presents to the hospital due to abdominal pain and noted to be in acute on chronic renal failure.  1. Abdominal pain- due to SBP - His CT scan does show a large volume ascites. - US guided paracentesis >5 liters fluid removed. WBC 805 c/w SBP -Continue IV ceftriaxone --change to oral cipro  2. Acute on chronic renal failure-patient's BUN and creatinine is elevated from baseline. In March patient's  creatinine was 3.7 currently elevated at 4.72 and BUN up to 138. - will get a nephrology consult. Patient has been on dialysis in the past. Hold Lasix and Aldactone for now. -he does not appear to be volume overloaded. -d/w dr Thedore Mins.  -appreciate vascular help. Pt is s/p HD tunneled cath placement. HD  STARTED on 01/17/2017  3. History of atrial  fibrillation-rate controlled. Continue amiodarone.  4. COPD-no acute exacerbation-continue Brio-Ellipta  5. GERD - cont. Protonix.   6. Hyperthyroidism - cont. Tapazole.    7. DM - cont. Novolog w/ meals.  - Carb control diet. Follow BS.   8. Severe Malnutrition in context of illness Follow dietiatin recs  Case discussed with Care Management/Social Worker. Management plans discussed with the patient, family and they are in agreement.  CODE STATUS: full  DVT Prophylaxis: lovneox  TOTAL TIME TAKING CARE OF THIS PATIENT: 25 minutes.  >50% time spent on counselling and coordination of care  POSSIBLE D/C IN few  DAYS, DEPENDING ON CLINICAL CONDITION.  Note: This dictation was prepared with Dragon dictation along with smaller phrase technology. Any transcriptional errors that result from this process are unintentional.  Westyn Driggers M.D on 01/18/2017 at 2:25 PM  Between 7am to 6pm - Pager - [phone]  After 6pm go to [email or web address] - Social research officer, government  [facility]  Office  [phone]  CC: Primary care physician; Corky Downs, MD

## 2017-01-18 NOTE — Progress Notes (Signed)
Subjective:   Patient has now been started on dialysis He tolerated his 1st treatment well Is getting ready for his 2nd treatment today No shortness of breath.  No edema.  Objective:  Vital signs in last 24 hours:  Temp:  [97.9 F (36.6 C)-98.4 F (36.9 C)] 98 F (36.7 C) (04/19 1500) Pulse Rate:  [64-84] 70 (04/19 1507) Resp:  [14-20] 16 (04/19 1507) BP: (106-140)/(67-87) 120/83 (04/19 1507) SpO2:  [93 %-100 %] 98 % (04/19 1507) Weight:  [83.2 kg (183 lb 6.8 oz)-83.8 kg (184 lb 11.9 oz)] 83.8 kg (184 lb 11.9 oz) (04/19 1500)  Weight change:  Filed Weights   01/14/17 0135 01/17/17 2035 01/18/17 1500  Weight: 84.4 kg (186 lb) 83.2 kg (183 lb 6.8 oz) 83.8 kg (184 lb 11.9 oz)    Intake/Output:    Intake/Output Summary (Last 24 hours) at 01/18/17 1536 Last data filed at 01/18/17 1300  Gross per 24 hour  Intake              720 ml  Output              450 ml  Net              270 ml     Physical Exam: General: No acute distress, laying in the bed  HEENT Moist oral mucous membranes  Neck supple  Pulm/lungs Normal breathing effort, clear to auscultation  CVS/Heart No rub or gallop  Abdomen:  Soft, nontender, mildly distended  Extremities: No peripheral edema  Neurologic: Alert, oriented  Skin: Multiple tattoos, normal turgor          Basic Metabolic Panel:   Recent Labs Lab 01/14/17 0203 01/15/17 0351  NA 131* 129*  K 4.2 4.9  CL 96* 96*  CO2 24 23  GLUCOSE 139* 184*  BUN 139* 141*  CREATININE 4.72* 4.73*  CALCIUM 9.3 8.5*  PHOS  --  6.5*     CBC:  Recent Labs Lab 01/14/17 0203  WBC 6.2  HGB 9.0*  HCT 28.6*  MCV 80.2  PLT 332      Microbiology:  Recent Results (from the past 720 hour(s))  Body fluid culture     Status: None   Collection Time: 01/15/17 10:05 AM  Result Value Ref Range Status   Specimen Description PERITONEAL  Final   Special Requests NONE  Final   Gram Stain   Final    RARE WBC PRESENT, PREDOMINANTLY  MONONUCLEAR NO ORGANISMS SEEN    Culture   Final    NO GROWTH 3 DAYS Performed at [facility], [address], [place], [place] [address]    Report Status 01/18/2017 FINAL  Final  MRSA PCR Screening     Status: None   Collection Time: 01/15/17 12:48 PM  Result Value Ref Range Status   MRSA by PCR NEGATIVE NEGATIVE Final    Comment:        The GeneXpert MRSA Assay (FDA approved for NASAL specimens only), is one component of a comprehensive MRSA colonization surveillance program. It is not intended to diagnose MRSA infection nor to guide or monitor treatment for MRSA infections.     Coagulation Studies: No results for input(s): LABPROT, INR in the last 72 hours.  Urinalysis: No results for input(s): COLORURINE, LABSPEC, PHURINE, GLUCOSEU, HGBUR, BILIRUBINUR, KETONESUR, PROTEINUR, UROBILINOGEN, NITRITE, LEUKOCYTESUR in the last 72 hours.  Invalid input(s): APPERANCEUR    Imaging: No results found.   Medications:    . amiodarone  200 mg Oral Daily  . carvedilol  3.125 mg Oral BID WC  . ciprofloxacin  500 mg Oral Q24H  . feeding supplement (NEPRO CARB STEADY)  237 mL Oral BID BM  . fluticasone furoate-vilanterol  1 puff Inhalation Daily  . insulin aspart  0-5 Units Subcutaneous QHS  . insulin aspart  0-9 Units Subcutaneous TID WC  . insulin aspart  4 Units Subcutaneous TID WC  . methimazole  10 mg Oral Daily  . pantoprazole  40 mg Oral Daily  . senna-docusate  1 tablet Oral BID  . sodium chloride flush  3 mL Intravenous Q12H  . tuberculin  5 Units Intradermal Once   acetaminophen **OR** acetaminophen, bisacodyl, morphine injection, ondansetron **OR** ondansetron (ZOFRAN) IV, polyethylene glycol, traMADol, zolpidem  Assessment/ Plan:  46 y.o.caucsian  male 10 with hypertension, diabetes mellitus type 2, chronic kidney disease stage III, peripheral neuropathy  1. 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  He has mild uremic symptoms in the form of poor appetite, nausea and  vomiting. Restart dialysis, 1st treatment done on 4/18 Patient requests placement at [facility] unit as it is 2 min from where he lives Discharge planning to be started Patient can be discharged as soon as dialysis chair schedule is available  2. Peritonitis with ascites - Currently treated with ciprofloxacin  3. Anemia of chronic kidney disease: hemoglobin 9.0 iron studies, EPO   LOS: 4 Juan Roberson 4/19/20183:36 PM

## 2017-01-18 NOTE — Progress Notes (Signed)
SUBJECTIVE: Patient is feeling much better no longer having shortness of breath or abdominal pain   Vitals:   01/17/17 2240 01/17/17 2243 01/17/17 2258 01/18/17 0459  BP: 122/81 126/79 114/68 115/74  Pulse: 72 71 72 69  Resp: Temp: 98 F (36.7 C)  97.9 F (36.6 C) 98 F (36.7 C)  TempSrc: Oral  Oral Oral  SpO2: 97% 96% 96% 93%  Weight:      Height:        Intake/Output Summary (Last 24 hours) at 01/18/17 0845 Last data filed at 01/18/17 0430  Gross per 24 hour  Intake              100 ml  Output              450 ml  Net             -350 ml    LABS: Basic Metabolic Panel: No results for input(s): NA, K, CL, CO2, GLUCOSE, BUN, CREATININE, CALCIUM, MG, PHOS in the last 72 hours. Liver Function Tests: No results for input(s): AST, ALT, ALKPHOS, BILITOT, PROT, ALBUMIN in the last 72 hours. No results for input(s): LIPASE, AMYLASE in the last 72 hours. CBC: No results for input(s): WBC, NEUTROABS, HGB, HCT, MCV, PLT in the last 72 hours. Cardiac Enzymes: No results for input(s): CKTOTAL, CKMB, CKMBINDEX, TROPONINI in the last 72 hours. BNP: Invalid input(s): POCBNP D-Dimer: No results for input(s): DDIMER in the last 72 hours. Hemoglobin A1C: No results for input(s): HGBA1C in the last 72 hours. Fasting Lipid Panel: No results for input(s): CHOL, HDL, LDLCALC, TRIG, CHOLHDL, LDLDIRECT in the last 72 hours. Thyroid Function Tests: No results for input(s): TSH, T4TOTAL, T3FREE, THYROIDAB in the last 72 hours.  Invalid input(s): FREET3 Anemia Panel: No results for input(s): VITAMINB12, FOLATE, FERRITIN, TIBC, IRON, RETICCTPCT in the last 72 hours.   PHYSICAL EXAM General: Well developed, well nourished, in no acute distress HEENT:  Normocephalic and atramatic Neck:  No JVD.  Lungs: Clear bilaterally to auscultation and percussion. Heart: HRRR . Normal S1 and S2 without gallops or murmurs.  Abdomen: Bowel sounds are positive, abdomen soft and non-tender   Msk:  Back normal, normal gait. Normal strength and tone for age. Extremities: No clubbing, cyanosis or edema.   Neuro: Alert and oriented X 3. Psych:  Good affect, responds appropriately  TELEMETRY:Atrial fibrillation with controlled ventricular rate  ASSESSMENT AND PLAN: Atrial fibrillation with controlled ventricular rate and cardiomyopathy with congestive heart failure. Patient is not a candidate for anticoagulation and continue Coreg. Advise follow-up in the office upon discharge.  Active Problems:   Acute on chronic renal failure (HCC)   CHF exacerbation (HCC)    Juan Roberson A, MD, [facility] 01/18/2017 8:45 AM

## 2017-01-18 NOTE — Progress Notes (Signed)
Nutrition Follow-up  DOCUMENTATION CODES:   Severe malnutrition in context of chronic illness  INTERVENTION:  Continue Nepro Shake po BID, each supplement provides 425 kcal and 19 grams protein.  Recommend renal-appropriate multivitamin (Renavite) QHS.  Patient would benefit from education regarding renal diet, but he is refusing at this time. Will continue to offer patient education at follow-ups while patient is admitted.  NUTRITION DIAGNOSIS:   Malnutrition (Severe) related to chronic illness (CKD, CHF) as evidenced by severe depletion of body fat, severe depletion of muscle mass.  Ongoing.  GOAL:   Patient will meet greater than or equal to 90% of their needs  Progressing.  MONITOR:   PO intake, Supplement acceptance, Labs, Weight trends, I & O's  REASON FOR ASSESSMENT:   Malnutrition Screening Tool    ASSESSMENT:   46 year old male with PMHx of DM type 2, HTN, CKD stage IV, GERD, osteomyelitis left food s/p I&D, left leg DVT on coumadin, gastritis, MI, severe ischemic cardiomyopathy (EF 10%), chronic systolic CHF who presents with abdominal pain found to have ascites, acute on chronic renal failure.  -Permcath placed 4/18 and patient underwent HD on 4/18. Per chart plan is for another HD session today.   Spoke with patient at bedside. He reports his appetite is improving now. He reports he finished 100% of dinner last night and breakfast this morning (total of approximately 516 kcal and 33 grams of protein between those two meals) and he was eating his lunch during assessment. He denies any N/V or abdominal pain. Patient reports he can still tolerate the Nepro even though he enjoys Premier Protein better.   Discussed with patient that due to his renal failure and need to restart dialysis Nepro may need to be continued for now. Premier Protein contains 350 mg phosphorus and 310 mg potassium while Nepro only has 170 mg phosphorus and 250 mg potassium. Earlier this week  patient had elevated phosphorus and is has not been checked again. Even if patient were to take phosphorus binder with Premier Protein in the future, it is high in potassium. This RD offered to provide patient with handouts and education on renal diet, but patient reports he has been on a renal diet several times before and still remembers everything. Patient then asked this RD to hand him a dark soda a friend had brought to the room. Discussed with patient high phosphorus content of dark sodas. Patient reports he remembers this.   Medications reviewed and include: ciprofloxacin, Novolog sliding scale TID with meals and daily at bedtime, Novolog 4 units TID with meals, pantoprazole, senna, morphine PRN.  Labs reviewed: CBG 158-204 past 24 hrs. No subsequent chem profile since initial assessment (at that time Potassium was WNL at 4.9 and Phosphorus was elevated at 6.5).  Weight trend: 83.8 kg on 4/19 (-0.6 kg from admission)  Discussed with RN.  Diet Order:  Diet renal/carb modified with fluid restriction Diet-HS Snack? Nothing; Room service appropriate? Yes; Fluid consistency: Thin  Skin:  Reviewed, no issues  Last BM:  01/14/2017  Height:   Ht Readings from Last 1 Encounters:  01/14/17  (1.88 m)    Weight:   Wt Readings from Last 1 Encounters:  01/18/17 184 lb 11.9 oz (83.8 kg)    Ideal Body Weight:  86.4 kg  BMI:  Body mass index is 23.72 kg/m.  Estimated Nutritional Needs:   Kcal:  9604-5409 (MSJ x 1.2-1.4)  Protein:  110-130 grams (1.3-1.5 grams/kg)  Fluid:  2.1 L/day  EDUCATION NEEDS:   No education needs identified at this time  Willey Blade, [place], RD, LDN Pager: [phone] After Hours Pager: [phone]

## 2017-01-18 NOTE — Care Management (Signed)
Patient lives at home with daughter.  Dan Humphreys was delivered to patient previous admissions.  Patient has bee [facility] with [facility] the patient.  Patient was previous HD patient.  Plant to restart HD.  Dimas Chyle HD liaison notified for new start placement.

## 2017-01-19 ENCOUNTER — Inpatient Hospital Stay: Payer: BLUE CROSS/BLUE SHIELD

## 2017-01-19 LAB — HEMOGLOBIN: Hemoglobin: 7.7 g/dL — ABNORMAL LOW (ref 13.0–18.0)

## 2017-01-19 LAB — RENAL FUNCTION PANEL
ANION GAP: 7 (ref 5–15)
Albumin: 2 g/dL — ABNORMAL LOW (ref 3.5–5.0)
BUN: 63 mg/dL — ABNORMAL HIGH (ref 6–20)
CALCIUM: 8.1 mg/dL — AB (ref 8.9–10.3)
CHLORIDE: 101 mmol/L (ref 101–111)
CO2: 26 mmol/L (ref 22–32)
Creatinine, Ser: 3.28 mg/dL — ABNORMAL HIGH (ref 0.61–1.24)
GFR calc non Af Amer: 21 mL/min — ABNORMAL LOW (ref >60)
GFR, EST AFRICAN AMERICAN: 24 mL/min — AB (ref >60)
GLUCOSE: 147 mg/dL — AB (ref 65–99)
Phosphorus: 3.7 mg/dL (ref 2.5–4.6)
Potassium: 4.6 mmol/L (ref 3.5–5.1)
SODIUM: 134 mmol/L — AB (ref 135–145)

## 2017-01-19 LAB — HEPATITIS B SURFACE ANTIBODY, QUANTITATIVE: Hepatitis B-Post: <3.1 m[IU]/mL — ABNORMAL LOW (ref >9.9)

## 2017-01-19 LAB — GLUCOSE, CAPILLARY
GLUCOSE-CAPILLARY: 139 mg/dL — AB (ref 65–99)
GLUCOSE-CAPILLARY: 102 mg/dL — AB (ref 65–99)
Glucose-Capillary: 185 mg/dL — ABNORMAL HIGH (ref 65–99)

## 2017-01-19 LAB — HEPATITIS B CORE ANTIBODY, TOTAL: Hep B Core Total Ab: NEGATIVE

## 2017-01-19 LAB — HEPATITIS B SURFACE ANTIGEN: HEP B S AG: NEGATIVE

## 2017-01-19 MED ORDER — LISINOPRIL 10 MG PO TABS
5.0000 mg | ORAL_TABLET | Freq: Every day | ORAL | Status: DC
  Administered 2017-01-19: 5 mg via ORAL
  Filled 2017-01-19: qty 1

## 2017-01-19 MED ORDER — LISINOPRIL 5 MG PO TABS: 5.0000 mg | ORAL_TABLET | Freq: Every day | ORAL | 1 refills | Status: DC

## 2017-01-19 MED ORDER — RENA-VITE PO TABS: 1.0000 | ORAL_TABLET | Freq: Every day | ORAL | 0 refills | Status: DC

## 2017-01-19 MED ORDER — RENA-VITE PO TABS: 1.0000 | ORAL_TABLET | Freq: Every day | ORAL | Status: DC

## 2017-01-19 MED ORDER — CIPROFLOXACIN HCL 500 MG PO TABS: 500.0000 mg | ORAL_TABLET | ORAL | 0 refills | Status: DC

## 2017-01-19 NOTE — Progress Notes (Signed)
Verbal order to place discharge order from MD.   Sharyon Medicus

## 2017-01-19 NOTE — Progress Notes (Signed)
Pre hd info 

## 2017-01-19 NOTE — Progress Notes (Signed)
Post hd vitals 

## 2017-01-19 NOTE — Care Management (Signed)
Patient to discharge today.  Patient outpatient HD schedule as follows TTS on [address] at 12:15 pm. Per MD and bedside nurse there is not a skilled need at this time for home health services.  RNCM signing off

## 2017-01-19 NOTE — Progress Notes (Signed)
Start of hd 

## 2017-01-19 NOTE — Progress Notes (Signed)
Patient discharge teaching given, including activity, diet, follow-up appoints, and medications. Patient verbalized understanding of all discharge instructions. IV access was d/c'd. Vitals are stable. Skin is intact except as charted in most recent assessments. Pt to be escorted out by NT, to be driven home by family.  Juan Roberson  

## 2017-01-19 NOTE — Progress Notes (Signed)
SUBJECTIVE: Patient is feeling much better   Vitals:   01/18/17 1739 01/18/17 1813 01/18/17 2019 01/19/17 0510  BP: 127/74 132/72 (!) 106/58 129/76  Pulse: 79 75 73 77  Resp: Temp: 98.3 F (36.8 C) 97.8 F (36.6 C) 98.1 F (36.7 C) 98.1 F (36.7 C)  TempSrc: Oral  Oral Oral  SpO2: 98% 97% 97% 99%  Weight: 185 lb 10 oz (84.2 kg)     Height:        Intake/Output Summary (Last 24 hours) at 01/19/17 0831 Last data filed at 01/19/17 1610  Gross per 24 hour  Intake              723 ml  Output              450 ml  Net              273 ml    LABS: Basic Metabolic Panel:  Recent Labs  96/04/54 0441  NA 134*  K 4.6  CL 101  CO2 26  GLUCOSE 147*  BUN 63*  CREATININE 3.28*  CALCIUM 8.1*  PHOS 3.7   Liver Function Tests:  Recent Labs  01/19/17 0441  ALBUMIN 2.0*   No results for input(s): LIPASE, AMYLASE in the last 72 hours. CBC:  Recent Labs  01/19/17 0441  HGB 7.7*   Cardiac Enzymes: No results for input(s): CKTOTAL, CKMB, CKMBINDEX, TROPONINI in the last 72 hours. BNP: Invalid input(s): POCBNP D-Dimer: No results for input(s): DDIMER in the last 72 hours. Hemoglobin A1C: No results for input(s): HGBA1C in the last 72 hours. Fasting Lipid Panel: No results for input(s): CHOL, HDL, LDLCALC, TRIG, CHOLHDL, LDLDIRECT in the last 72 hours. Thyroid Function Tests: No results for input(s): TSH, T4TOTAL, T3FREE, THYROIDAB in the last 72 hours.  Invalid input(s): FREET3 Anemia Panel: No results for input(s): VITAMINB12, FOLATE, FERRITIN, TIBC, IRON, RETICCTPCT in the last 72 hours.   PHYSICAL EXAM General: Well developed, well nourished, in no acute distress HEENT:  Normocephalic and atramatic Neck:  No JVD.  Lungs: Clear bilaterally to auscultation and percussion. Heart: HRRR . Normal S1 and S2 without gallops or murmurs.  Abdomen: Bowel sounds are positive, abdomen soft and non-tender  Msk:  Back normal, normal gait. Normal strength and  tone for age. Extremities: No clubbing, cyanosis or edema.   Neuro: Alert and oriented X 3. Psych:  Good affect, responds appropriately  TELEMETRY:A. fib with controlled ventricular rate  ASSESSMENT AND PLAN: Atrial fibrillation with controlled ventricular rate and cardiomyopathy with severe LV dysfunction and congestive heart failure. Patient has peritonitis which is getting better and renal failure for which dialysis will be done.  Active Problems:   Acute on chronic renal failure (HCC)   CHF exacerbation (HCC)    Sharniece Gibbon A, MD, [facility] 01/19/2017 8:31 AM

## 2017-01-19 NOTE — Progress Notes (Signed)
Post hd assessment 

## 2017-01-19 NOTE — Progress Notes (Signed)
Subjective:   Patient has now been started on dialysis   HEMODIALYSIS FLOWSHEET:  Blood Flow Rate (mL/min): 300 mL/min Arterial Pressure (mmHg): -100 mmHg Venous Pressure (mmHg): 100 mmHg Transmembrane Pressure (mmHg): 50 mmHg Ultrafiltration Rate (mL/min): 170 mL/min Dialysate Flow Rate (mL/min): 600 ml/min Conductivity: Machine : 14 Conductivity: Machine : 14 Dialysis Fluid Bolus: Normal Saline Bolus Amount (mL): 250 mL Dialysate Change:  (3k) Intra-Hemodialysis Comments: 325. resting.    Objective:  Vital signs in last 24 hours:  Temp:  [97.8 F (36.6 C)-98.4 F (36.9 C)] 98.4 F (36.9 C) (04/20 1250) Pulse Rate:  [73-79] 77 (04/20 1455) Resp:  [13-19] 18 (04/20 1455) BP: (106-132)/(58-82) 128/82 (04/20 1455) SpO2:  [96 %-99 %] 97 % (04/20 1455) Weight:  [84 kg (185 lb 3 oz)-84.2 kg (185 lb 10 oz)] 84 kg (185 lb 3 oz) (04/20 1250)  Weight change: 0.6 kg (1 lb 5.2 oz) Filed Weights   01/18/17 1500 01/18/17 1739 01/19/17 1250  Weight: 83.8 kg (184 lb 11.9 oz) 84.2 kg (185 lb 10 oz) 84 kg (185 lb 3 oz)    Intake/Output:    Intake/Output Summary (Last 24 hours) at 01/19/17 1539 Last data filed at 01/19/17 1300  Gross per 24 hour  Intake              603 ml  Output              250 ml  Net              353 ml     Physical Exam: General: No acute distress, laying in the bed  HEENT Moist oral mucous membranes  Neck supple  Pulm/lungs Normal breathing effort, clear to auscultation  CVS/Heart No rub or gallop  Abdomen:  Soft, nontender, mildly distended  Extremities: No peripheral edema  Neurologic: Alert, oriented  Skin: Multiple tattoos, normal turgor   Left IJ PC 01/17/17       Basic Metabolic Panel:   Recent Labs Lab 01/14/17 0203 01/15/17 0351 01/19/17 0441  NA 131* 129* 134*  K 4.2 4.9 4.6  CL 96* 96* 101  CO2 GLUCOSE 139* 184* 147*  BUN 139* 141* 63*  CREATININE 4.72* 4.73* 3.28*  CALCIUM 9.3 8.5* 8.1*  PHOS  --  6.5* 3.7      CBC:  Recent Labs Lab 01/14/17 0203 01/19/17 0441  WBC 6.2  --   HGB 9.0* 7.7*  HCT 28.6*  --   MCV 80.2  --   PLT 332  --       Microbiology:  Recent Results (from the past 720 hour(s))  Body fluid culture     Status: None   Collection Time: 01/15/17 10:05 AM  Result Value Ref Range Status   Specimen Description PERITONEAL  Final   Special Requests NONE  Final   Gram Stain   Final    RARE WBC PRESENT, PREDOMINANTLY MONONUCLEAR NO ORGANISMS SEEN    Culture   Final    NO GROWTH 3 DAYS Performed at [facility], [address] [place], [place] [address]    Report Status 01/18/2017 FINAL  Final  MRSA PCR Screening     Status: None   Collection Time: 01/15/17 12:48 PM  Result Value Ref Range Status   MRSA by PCR NEGATIVE NEGATIVE Final    Comment:        The GeneXpert MRSA Assay (FDA approved for NASAL specimens only), is one component of a  comprehensive MRSA colonization surveillance program. It is not intended to diagnose MRSA infection nor to guide or monitor treatment for MRSA infections.     Coagulation Studies: No results for input(s): LABPROT, INR in the last 72 hours.  Urinalysis: No results for input(s): COLORURINE, LABSPEC, PHURINE, GLUCOSEU, HGBUR, BILIRUBINUR, KETONESUR, PROTEINUR, UROBILINOGEN, NITRITE, LEUKOCYTESUR in the last 72 hours.  Invalid input(s): APPERANCEUR    Imaging: No results found.   Medications:    . amiodarone  200 mg Oral Daily  . carvedilol  3.125 mg Oral BID WC  . ciprofloxacin  500 mg Oral Q24H  . feeding supplement (NEPRO CARB STEADY)  237 mL Oral BID BM  . fluticasone furoate-vilanterol  1 puff Inhalation Daily  . insulin aspart  0-5 Units Subcutaneous QHS  . insulin aspart  0-9 Units Subcutaneous TID WC  . insulin aspart  4 Units Subcutaneous TID WC  . lisinopril  5 mg Oral Daily  . methimazole  10 mg Oral Daily  . multivitamin  1 tablet Oral QHS  . pantoprazole  40 mg Oral Daily  .  senna-docusate  1 tablet Oral BID  . sodium chloride flush  3 mL Intravenous Q12H  . tuberculin  5 Units Intradermal Once   acetaminophen **OR** acetaminophen, bisacodyl, morphine injection, ondansetron **OR** ondansetron (ZOFRAN) IV, polyethylene glycol, traMADol, zolpidem  Assessment/ Plan:  46 y.o.caucsian  male  with hypertension, diabetes mellitus type 2, chronic kidney disease stage III, peripheral neuropathy  1. 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  He has mild uremic symptoms in the form of poor appetite, nausea and vomiting. Restart dialysis, 1st treatment done on 4/18 [name] is out of network;   outpatient d/c to [name] unit Orders faxed  2. Peritonitis with ascites - Currently treated with ciprofloxacin  3. Anemia of chronic kidney disease: hemoglobin 7.7  EPO with HD as outpatient    LOS: 5 Juan Roberson 4/20/20183:39 PM

## 2017-01-19 NOTE — Progress Notes (Signed)
  End of hd 

## 2017-01-19 NOTE — Progress Notes (Signed)
Pre hd assessment  

## 2017-01-19 NOTE — Discharge Instructions (Signed)
[facility] Failure [facility] appointment on Jan 30, 2017 at 9:00am with Clarisa Kindred, FNP. Please call [phone] to reschedule.   YOUR HD has been scheduled on [name] TTS at 12:15 pm

## 2017-01-19 NOTE — Discharge Summary (Signed)
[facility] - [place] at [facility]   PATIENT NAME: Juan Roberson    MR#:  [id number]  DATE OF BIRTH:  1971-03-02  DATE OF ADMISSION:  01/14/2017 ADMITTING PHYSICIAN: Ihor Austin, MD  DATE OF DISCHARGE: 01/19/17  PRIMARY CARE PHYSICIAN: MASOUD,JAVED, MD    ADMISSION DIAGNOSIS:  Generalized abdominal pain [R10.84] Other ascites [R18.8] Ascites [R18.8] Acute renal failure superimposed on chronic kidney disease, unspecified CKD stage, unspecified acute renal failure type (HCC) [N17.9, N18.9]  DISCHARGE DIAGNOSIS:  ESRD on HD SBP  SECONDARY DIAGNOSIS:   Past Medical History:  Diagnosis Date  . Cellulitis and abscess of foot    Left-Dr. Ether Griffins  . CHF (congestive heart failure) (HCC)    EF 20%  . CKD (chronic kidney disease), stage III   . Diabetes mellitus without complication (HCC)    a. Dx ~ 1996.  [name] Dysrhythmia   . Essential hypertension   . Gastritis    a. 04/2015 hematemesis -> EGD: gastritis, esophagitis, duodenitis.  No active bleeding.  PPI added.  [name] GERD (gastroesophageal reflux disease)   . Left leg DVT (HCC)    a. Dx 05/2015 -> Coumadin.  . MI (myocardial infarction) (HCC)   . Osteomyelitis (HCC)    a. 05/2015 L foot.    HOSPITAL COURSE:   46 year old male with past medical history of chronic kidney disease stage IV, hypertension, hyperlipidemia, severe ischemic cardiomyopathy ejection fraction of 10%, chronic systolic CHF, GERD, who presents to the hospital due to abdominal pain and noted to be in acute on chronic renal failure.  1. Abdominal pain- due to SBP - His CT scan does show a large volume ascites. - US guided paracentesis >5 liters fluid removed. WBC 805 c/w SBP -Continue IV ceftriaxone --change to oral cipro  2.ESRD on HD -patient's BUN and creatinine is elevated from baseline. In March patient's creatinine was 3.7 currently elevated at 4.72 and BUN up to 138. - Patient has been on dialysis in the past. Hold Lasix and  Aldactone for now. -he does not appear to be volume overloaded. -d/w dr Thedore Mins.  -appreciate vascular help. Pt is s/p HD tunneled cath placement. HD  STARTED on 01/17/2017. Pt has TTS on [address] at 12:15 pm  3. History of atrial fibrillation-rate controlled. Continue amiodarone.  4. COPD-no acute exacerbation-continue Brio-Ellipta  5. GERD - cont. Protonix.   6. Hyperthyroidism - cont. Tapazole.   7. DM - cont. Novolog w/ meals.  - Carb control diet. Follow BS.   8. Severe Malnutrition in context of illness Follow dietiatin recs  D/c  home  Case discussed with Care Management/Social Worker. Management plans discussed with the patient, family and they are in agreement.  CODE STATUS: full CONSULTS OBTAINED:  Treatment Team:  Lamont Dowdy, MD Laurier Nancy, MD Renford Dills, MD  DRUG ALLERGIES:   Allergies  Allergen Reactions  . Sulfur Other (See Comments)    Pt states that this medication causes renal failure.      DISCHARGE MEDICATIONS:   Current Discharge Medication List    START taking these medications   Details  ciprofloxacin (CIPRO) 500 MG tablet Take 1 tablet (500 mg total) by mouth daily. Qty: 16 tablet, Refills: 0    lisinopril (PRINIVIL,ZESTRIL) 5 MG tablet Take 1 tablet (5 mg total) by mouth daily. Qty: 30 tablet, Refills: 1    multivitamin (RENA-VIT) TABS tablet Take 1 tablet by mouth at bedtime. Qty: 30 tablet, Refills: 0      CONTINUE  these medications which have NOT CHANGED   Details  amiodarone (PACERONE) 200 MG tablet Take 1 tablet (200 mg total) by mouth daily. Qty: 30 tablet, Refills: 2    aspirin EC 81 MG EC tablet Take 1 tablet (81 mg total) by mouth daily. Qty: 30 tablet, Refills: 0    bisacodyl (DULCOLAX) 10 MG suppository Place 1 suppository (10 mg total) rectally daily as needed for moderate constipation. Qty: 12 suppository, Refills: 0    BREO ELLIPTA 100-25 MCG/INH AEPB Inhale 1 puff into the lungs daily.   Refills: 3    carvedilol (COREG) 3.125 MG tablet Take 1 tablet (3.125 mg total) by mouth 2 (two) times daily with a meal. Qty: 60 tablet, Refills: 0    furosemide (LASIX) 80 MG tablet Take 1 tablet (80 mg total) by mouth 2 (two) times daily. Qty: 60 tablet, Refills: 2    insulin aspart (NOVOLOG) 100 UNIT/ML injection Inject 4 Units into the skin 3 (three) times daily with meals. Qty: 10 mL, Refills: 11    methimazole (TAPAZOLE) 10 MG tablet Take 1 tablet (10 mg total) by mouth daily. Qty: 30 tablet, Refills: 0    Nutritional Supplements (FEEDING SUPPLEMENT, NEPRO CARB STEADY,) LIQD Take 237 mLs by mouth 2 (two) times daily between meals. Qty: 20 Can, Refills: 0    pantoprazole (PROTONIX) 40 MG tablet Take 1 tablet (40 mg total) by mouth daily. Qty: 30 tablet, Refills: 0    spironolactone (ALDACTONE) 25 MG tablet Take 1 tablet (25 mg total) by mouth daily. Qty: 30 tablet, Refills: 1    traMADol (ULTRAM) 50 MG tablet Take 50 mg by mouth every 6 (six) hours. Refills: 2    zolpidem (AMBIEN) 5 MG tablet Take 5 mg by mouth at bedtime as needed for sleep.  Refills: 0      STOP taking these medications     cefdinir (OMNICEF) 300 MG capsule         If you experience worsening of your admission symptoms, develop shortness of breath, life threatening emergency, suicidal or homicidal thoughts you must seek medical attention immediately by calling 911 or calling your MD immediately  if symptoms less severe.  You Must read complete instructions/literature along with all the possible adverse reactions/side effects for all the Medicines you take and that have been prescribed to you. Take any new Medicines after you have completely understood and accept all the possible adverse reactions/side effects.   Please note  You were cared for by a hospitalist during your hospital stay. If you have any questions about your discharge medications or the care you received while you were in the  hospital after you are discharged, you can call the unit and asked to speak with the hospitalist on call if the hospitalist that took care of you is not available. Once you are discharged, your primary care physician will handle any further medical issues. Please note that NO REFILLS for any discharge medications will be authorized once you are discharged, as it is imperative that you return to your primary care physician (or establish a relationship with a primary care physician if you do not have one) for your aftercare needs so that they can reassess your need for medications and monitor your lab values. Today   SUBJECTIVE   No new complaints  VITAL SIGNS:  Blood pressure 118/80, pulse 76, temperature 98.4 F (36.9 C), temperature source Oral, resp. rate 14, height  (1.88 m), weight 84 kg (185 lb 3 oz),  SpO2 97 %.  I/O:   Intake/Output Summary (Last 24 hours) at 01/19/17 1450 Last data filed at 01/19/17 1300  Gross per 24 hour  Intake              603 ml  Output              250 ml  Net              353 ml    PHYSICAL EXAMINATION:  GENERAL:  46 y.o.-year-old patient lying in the bed with no acute distress.  EYES: Pupils equal, round, reactive to light and accommodation. No scleral icterus. Extraocular muscles intact.  HEENT: Head atraumatic, normocephalic. Oropharynx and nasopharynx clear.  NECK:  Supple, no jugular venous distention. No thyroid enlargement, no tenderness.  LUNGS: Normal breath sounds bilaterally, no wheezing, rales,rhonchi or crepitation. No use of accessory muscles of respiration. HD cath+ CARDIOVASCULAR: S1, S2 normal. No murmurs, rubs, or gallops.  ABDOMEN: Soft, non-tender, non-distended. Bowel sounds present. No organomegaly or mass.  EXTREMITIES: No pedal edema, cyanosis, or clubbing.  NEUROLOGIC: Cranial nerves II through XII are intact. Muscle strength 5/5 in all extremities. Sensation intact. Gait not checked.  PSYCHIATRIC: The patient is alert and  oriented x 3.  SKIN: No obvious rash, lesion, or ulcer. tattoo  DATA REVIEW:   CBC   Recent Labs Lab 01/14/17 0203 01/19/17 0441  WBC 6.2  --   HGB 9.0* 7.7*  HCT 28.6*  --   PLT 332  --     Chemistries   Recent Labs Lab 01/14/17 0203  01/19/17 0441  NA 131*  < > 134*  K 4.2  < > 4.6  CL 96*  < > 101  CO2 24  < > 26  GLUCOSE 139*  < > 147*  BUN 139*  < > 63*  CREATININE 4.72*  < > 3.28*  CALCIUM 9.3  < > 8.1*  AST 15  --   --   ALT 10*  --   --   ALKPHOS 66  --   --   BILITOT 0.9  --   --   < > = values in this interval not displayed.  Microbiology Results   Recent Results (from the past 240 hour(s))  Body fluid culture     Status: None   Collection Time: 01/15/17 10:05 AM  Result Value Ref Range Status   Specimen Description PERITONEAL  Final   Special Requests NONE  Final   Gram Stain   Final    RARE WBC PRESENT, PREDOMINANTLY MONONUCLEAR NO ORGANISMS SEEN    Culture   Final    NO GROWTH 3 DAYS Performed at [facility], [address], [place], [place] [address]    Report Status 01/18/2017 FINAL  Final  MRSA PCR Screening     Status: None   Collection Time: 01/15/17 12:48 PM  Result Value Ref Range Status   MRSA by PCR NEGATIVE NEGATIVE Final    Comment:        The GeneXpert MRSA Assay (FDA approved for NASAL specimens only), is one component of a comprehensive MRSA colonization surveillance program. It is not intended to diagnose MRSA infection nor to guide or monitor treatment for MRSA infections.     RADIOLOGY:  No results found.   Management plans discussed with the patient, family and they are in agreement.  CODE STATUS:     Code Status Orders        Start  Ordered   01/14/17 0618  Full code  Continuous     01/14/17 0618    Code Status History    Date Active Date Inactive Code Status Order ID Comments User Context   12/05/2016  8:01 PM 12/08/2016  3:11 PM Full Code [id number]  Altamese Dilling, MD Inpatient    10/28/2016  8:42 PM 10/31/2016  8:35 PM Full Code [id number]  Houston Siren, MD Inpatient   10/17/2016  8:18 PM 10/27/2016  6:48 PM Full Code [id number]  Auburn Bilberry, MD Inpatient   09/24/2016  3:36 PM 09/25/2016  2:15 PM Full Code [id number]  Wyatt Haste, MD ED   07/21/2016 11:05 AM 07/22/2016  3:11 PM Full Code [id number]  Alford Highland, MD ED   02/28/2016  1:43 AM 03/01/2016  6:06 PM Full Code [id number]  Arnaldo Natal, MD Inpatient   12/06/2015  8:39 PM 12/08/2015  2:51 PM Full Code [id number]  Altamese Dilling, MD Inpatient   11/01/2015  4:00 PM 11/03/2015  2:28 PM Full Code [id number]  Wyatt Haste, MD ED   06/29/2015  2:30 AM 07/01/2015  2:21 PM Full Code [id number]  Arnaldo Natal, MD Inpatient   05/21/2015  4:34 PM 05/25/2015  4:34 PM Full Code [id number]  Linus Galas, MD Inpatient   05/18/2015  9:17 PM 05/21/2015  4:34 PM Full Code [id number]  Shaune Pollack, MD Inpatient   04/05/2015  7:05 AM 04/08/2015  2:00 PM Full Code [id number]  Arnaldo Natal, MD Inpatient      TOTAL TIME TAKING CARE OF THIS PATIENT: 40 minutes.    Namya Voges M.D on 01/19/2017 at 2:50 PM  Between 7am to 6pm - Pager - [phone] After 6pm go to [email or web address] - Social research officer, government  [facility]  Office  [phone]  CC: Primary care physician; Corky Downs, MD

## 2017-01-25 ENCOUNTER — Emergency Department: Payer: BLUE CROSS/BLUE SHIELD

## 2017-01-25 ENCOUNTER — Inpatient Hospital Stay: Payer: BLUE CROSS/BLUE SHIELD

## 2017-01-25 ENCOUNTER — Inpatient Hospital Stay
Admission: EM | Admit: 2017-01-25 | Discharge: 2017-01-27 | DRG: 291 | Disposition: A | Discharge status: Home / Self Care | Payer: BLUE CROSS/BLUE SHIELD | Attending: Internal Medicine | Admitting: Internal Medicine

## 2017-01-25 ENCOUNTER — Encounter: Payer: Self-pay | Admitting: Emergency Medicine

## 2017-01-25 DIAGNOSIS — N2581 Secondary hyperparathyroidism of renal origin: Secondary | ICD-10-CM | POA: Diagnosis present

## 2017-01-25 DIAGNOSIS — R339 Retention of urine, unspecified: Secondary | ICD-10-CM

## 2017-01-25 DIAGNOSIS — E0590 Thyrotoxicosis, unspecified without thyrotoxic crisis or storm: Secondary | ICD-10-CM | POA: Diagnosis present

## 2017-01-25 DIAGNOSIS — R0602 Shortness of breath: Secondary | ICD-10-CM | POA: Diagnosis present

## 2017-01-25 DIAGNOSIS — D631 Anemia in chronic kidney disease: Secondary | ICD-10-CM | POA: Diagnosis present

## 2017-01-25 DIAGNOSIS — K652 Spontaneous bacterial peritonitis: Secondary | ICD-10-CM | POA: Diagnosis present

## 2017-01-25 DIAGNOSIS — Z800 Family history of malignant neoplasm of digestive organs: Secondary | ICD-10-CM

## 2017-01-25 DIAGNOSIS — Z7982 Long term (current) use of aspirin: Secondary | ICD-10-CM | POA: Diagnosis not present

## 2017-01-25 DIAGNOSIS — Z794 Long term (current) use of insulin: Secondary | ICD-10-CM

## 2017-01-25 DIAGNOSIS — E1142 Type 2 diabetes mellitus with diabetic polyneuropathy: Secondary | ICD-10-CM | POA: Diagnosis present

## 2017-01-25 DIAGNOSIS — Z801 Family history of malignant neoplasm of trachea, bronchus and lung: Secondary | ICD-10-CM

## 2017-01-25 DIAGNOSIS — Z86718 Personal history of other venous thrombosis and embolism: Secondary | ICD-10-CM

## 2017-01-25 DIAGNOSIS — K219 Gastro-esophageal reflux disease without esophagitis: Secondary | ICD-10-CM | POA: Diagnosis present

## 2017-01-25 DIAGNOSIS — I132 Hypertensive heart and chronic kidney disease with heart failure and with stage 5 chronic kidney disease, or end stage renal disease: Secondary | ICD-10-CM | POA: Diagnosis present

## 2017-01-25 DIAGNOSIS — E1122 Type 2 diabetes mellitus with diabetic chronic kidney disease: Secondary | ICD-10-CM | POA: Diagnosis present

## 2017-01-25 DIAGNOSIS — I252 Old myocardial infarction: Secondary | ICD-10-CM | POA: Diagnosis not present

## 2017-01-25 DIAGNOSIS — J9621 Acute and chronic respiratory failure with hypoxia: Secondary | ICD-10-CM | POA: Diagnosis present

## 2017-01-25 DIAGNOSIS — I255 Ischemic cardiomyopathy: Secondary | ICD-10-CM | POA: Diagnosis present

## 2017-01-25 DIAGNOSIS — Z8049 Family history of malignant neoplasm of other genital organs: Secondary | ICD-10-CM

## 2017-01-25 DIAGNOSIS — R188 Other ascites: Secondary | ICD-10-CM | POA: Diagnosis present

## 2017-01-25 DIAGNOSIS — Z9889 Other specified postprocedural states: Secondary | ICD-10-CM

## 2017-01-25 DIAGNOSIS — Z8249 Family history of ischemic heart disease and other diseases of the circulatory system: Secondary | ICD-10-CM

## 2017-01-25 DIAGNOSIS — Z803 Family history of malignant neoplasm of breast: Secondary | ICD-10-CM | POA: Diagnosis not present

## 2017-01-25 DIAGNOSIS — N186 End stage renal disease: Secondary | ICD-10-CM | POA: Diagnosis present

## 2017-01-25 DIAGNOSIS — J90 Pleural effusion, not elsewhere classified: Secondary | ICD-10-CM

## 2017-01-25 DIAGNOSIS — J449 Chronic obstructive pulmonary disease, unspecified: Secondary | ICD-10-CM | POA: Diagnosis present

## 2017-01-25 DIAGNOSIS — I5023 Acute on chronic systolic (congestive) heart failure: Secondary | ICD-10-CM | POA: Diagnosis present

## 2017-01-25 DIAGNOSIS — Z79899 Other long term (current) drug therapy: Secondary | ICD-10-CM | POA: Diagnosis not present

## 2017-01-25 DIAGNOSIS — R34 Anuria and oliguria: Secondary | ICD-10-CM

## 2017-01-25 DIAGNOSIS — Z992 Dependence on renal dialysis: Secondary | ICD-10-CM

## 2017-01-25 LAB — URINALYSIS, COMPLETE (UACMP) WITH MICROSCOPIC
Bilirubin Urine: NEGATIVE
Glucose, UA: NEGATIVE mg/dL
Ketones, ur: NEGATIVE mg/dL
Leukocytes, UA: NEGATIVE
Nitrite: NEGATIVE
Protein, ur: 30 mg/dL — AB
Specific Gravity, Urine: 1.012 (ref 1.005–1.030)
pH: 5 (ref 5.0–8.0)

## 2017-01-25 LAB — BASIC METABOLIC PANEL
Anion gap: 11 (ref 5–15)
BUN: 39 mg/dL — ABNORMAL HIGH (ref 6–20)
CO2: 29 mmol/L (ref 22–32)
Calcium: 8.7 mg/dL — ABNORMAL LOW (ref 8.9–10.3)
Chloride: 98 mmol/L — ABNORMAL LOW (ref 101–111)
Creatinine, Ser: 4.27 mg/dL — ABNORMAL HIGH (ref 0.61–1.24)
GFR calc Af Amer: 18 mL/min — ABNORMAL LOW (ref >60)
GFR calc non Af Amer: 15 mL/min — ABNORMAL LOW (ref >60)
Glucose, Bld: 121 mg/dL — ABNORMAL HIGH (ref 65–99)
Potassium: 4.3 mmol/L (ref 3.5–5.1)
Sodium: 138 mmol/L (ref 135–145)

## 2017-01-25 LAB — CBC
HEMATOCRIT: 26.5 % — AB (ref 40.0–52.0)
HEMOGLOBIN: 8.2 g/dL — AB (ref 13.0–18.0)
MCH: 24.7 pg — ABNORMAL LOW (ref 26.0–34.0)
MCHC: 30.8 g/dL — AB (ref 32.0–36.0)
MCV: 80.2 fL (ref 80.0–100.0)
Platelets: 244 10*3/uL (ref 150–440)
RBC: 3.31 MIL/uL — ABNORMAL LOW (ref 4.40–5.90)
RDW: 18.5 % — ABNORMAL HIGH (ref 11.5–14.5)
WBC: 7.3 10*3/uL (ref 3.8–10.6)

## 2017-01-25 LAB — BODY FLUID CELL COUNT WITH DIFFERENTIAL
Eos, Fluid: 2 %
Lymphs, Fluid: 8 %
MONOCYTE-MACROPHAGE-SEROUS FLUID: 87 %
Neutrophil Count, Fluid: 3 %
Other Cells, Fluid: 0 %
WBC FLUID: 138 uL

## 2017-01-25 LAB — GLUCOSE, CAPILLARY
Glucose-Capillary: 96 mg/dL (ref 65–99)
Glucose-Capillary: 126 mg/dL — ABNORMAL HIGH (ref 65–99)

## 2017-01-25 LAB — PHOSPHORUS: Phosphorus: 2.2 mg/dL — ABNORMAL LOW (ref 2.5–4.6)

## 2017-01-25 LAB — PROTEIN, PLEURAL OR PERITONEAL FLUID: TOTAL PROTEIN, FLUID: 3.8 g/dL

## 2017-01-25 LAB — GLUCOSE, PLEURAL OR PERITONEAL FLUID: Glucose, Fluid: 128 mg/dL

## 2017-01-25 LAB — LACTATE DEHYDROGENASE, PLEURAL OR PERITONEAL FLUID: LD, Fluid: 62 U/L — ABNORMAL HIGH (ref 3–23)

## 2017-01-25 MED ORDER — ALTEPLASE 2 MG IJ SOLR: 2.0000 mg | Freq: Once | INTRAMUSCULAR | Status: DC | PRN

## 2017-01-25 MED ORDER — ACETAMINOPHEN 325 MG PO TABS: 650.0000 mg | ORAL_TABLET | Freq: Four times a day (QID) | ORAL | Status: DC | PRN

## 2017-01-25 MED ORDER — CIPROFLOXACIN HCL 500 MG PO TABS
500.0000 mg | ORAL_TABLET | Freq: Every day | ORAL | Status: DC
  Administered 2017-01-25 – 2017-01-26 (×2): 500 mg via ORAL
  Filled 2017-01-25 (×2): qty 1

## 2017-01-25 MED ORDER — AMIODARONE HCL 200 MG PO TABS
200.0000 mg | ORAL_TABLET | Freq: Every day | ORAL | Status: DC
  Administered 2017-01-26 – 2017-01-27 (×2): 200 mg via ORAL
  Filled 2017-01-25 (×2): qty 1

## 2017-01-25 MED ORDER — HEPARIN SODIUM (PORCINE) 5000 UNIT/ML IJ SOLN
5000.0000 [IU] | Freq: Three times a day (TID) | INTRAMUSCULAR | Status: DC
  Administered 2017-01-25 – 2017-01-26 (×2): 5000 [IU] via SUBCUTANEOUS
  Filled 2017-01-25: qty 1

## 2017-01-25 MED ORDER — BISACODYL 10 MG RE SUPP: 10.0000 mg | Freq: Every day | RECTAL | Status: DC | PRN

## 2017-01-25 MED ORDER — SPIRONOLACTONE 25 MG PO TABS
25.0000 mg | ORAL_TABLET | Freq: Every day | ORAL | Status: DC
  Administered 2017-01-26 – 2017-01-27 (×2): 25 mg via ORAL
  Filled 2017-01-25 (×2): qty 1

## 2017-01-25 MED ORDER — CARVEDILOL 3.125 MG PO TABS
3.1250 mg | ORAL_TABLET | Freq: Two times a day (BID) | ORAL | Status: DC
  Administered 2017-01-26 – 2017-01-27 (×3): 3.125 mg via ORAL
  Filled 2017-01-25 (×4): qty 1

## 2017-01-25 MED ORDER — INSULIN ASPART 100 UNIT/ML ~~LOC~~ SOLN: 0.0000 [IU] | Freq: Every day | SUBCUTANEOUS | Status: DC

## 2017-01-25 MED ORDER — LISINOPRIL 10 MG PO TABS
5.0000 mg | ORAL_TABLET | Freq: Every day | ORAL | Status: DC
  Administered 2017-01-27: 5 mg via ORAL
  Filled 2017-01-25: qty 1

## 2017-01-25 MED ORDER — ONDANSETRON HCL 4 MG/2ML IJ SOLN: 4.0000 mg | Freq: Four times a day (QID) | INTRAMUSCULAR | Status: DC | PRN

## 2017-01-25 MED ORDER — SODIUM CHLORIDE 0.9 % IV SOLN: 100.0000 mL | INTRAVENOUS | Status: DC | PRN

## 2017-01-25 MED ORDER — ACETAMINOPHEN 650 MG RE SUPP: 650.0000 mg | Freq: Four times a day (QID) | RECTAL | Status: DC | PRN

## 2017-01-25 MED ORDER — INSULIN ASPART 100 UNIT/ML ~~LOC~~ SOLN
4.0000 [IU] | Freq: Three times a day (TID) | SUBCUTANEOUS | Status: DC
  Administered 2017-01-26 – 2017-01-27 (×4): 4 [IU] via SUBCUTANEOUS
  Filled 2017-01-25 (×4): qty 4

## 2017-01-25 MED ORDER — HEPARIN SODIUM (PORCINE) 1000 UNIT/ML DIALYSIS
1000.0000 [IU] | INTRAMUSCULAR | Status: DC | PRN
  Filled 2017-01-25: qty 1

## 2017-01-25 MED ORDER — PENTAFLUOROPROP-TETRAFLUOROETH EX AERO
1.0000 "application " | INHALATION_SPRAY | CUTANEOUS | Status: DC | PRN
  Filled 2017-01-25: qty 30

## 2017-01-25 MED ORDER — FLUTICASONE FUROATE-VILANTEROL 100-25 MCG/INH IN AEPB
1.0000 | INHALATION_SPRAY | Freq: Every day | RESPIRATORY_TRACT | Status: DC
  Administered 2017-01-26: 1 via RESPIRATORY_TRACT
  Filled 2017-01-25: qty 28

## 2017-01-25 MED ORDER — INSULIN ASPART 100 UNIT/ML ~~LOC~~ SOLN
0.0000 [IU] | Freq: Three times a day (TID) | SUBCUTANEOUS | Status: DC
  Administered 2017-01-26: 2 [IU] via SUBCUTANEOUS
  Administered 2017-01-26 (×2): 1 [IU] via SUBCUTANEOUS
  Filled 2017-01-25: qty 2
  Filled 2017-01-25 (×2): qty 1

## 2017-01-25 MED ORDER — ONDANSETRON HCL 4 MG PO TABS: 4.0000 mg | ORAL_TABLET | Freq: Four times a day (QID) | ORAL | Status: DC | PRN

## 2017-01-25 MED ORDER — ASPIRIN EC 81 MG PO TBEC
81.0000 mg | DELAYED_RELEASE_TABLET | Freq: Every day | ORAL | Status: DC
  Administered 2017-01-26 – 2017-01-27 (×2): 81 mg via ORAL
  Filled 2017-01-25 (×2): qty 1

## 2017-01-25 MED ORDER — LIDOCAINE HCL (PF) 1 % IJ SOLN
5.0000 mL | INTRAMUSCULAR | Status: DC | PRN
  Filled 2017-01-25: qty 5

## 2017-01-25 MED ORDER — FUROSEMIDE 80 MG PO TABS
80.0000 mg | ORAL_TABLET | Freq: Two times a day (BID) | ORAL | Status: DC
  Administered 2017-01-25 – 2017-01-27 (×4): 80 mg via ORAL
  Filled 2017-01-25 (×4): qty 1

## 2017-01-25 MED ORDER — LIDOCAINE-PRILOCAINE 2.5-2.5 % EX CREA
1.0000 "application " | TOPICAL_CREAM | CUTANEOUS | Status: DC | PRN
  Filled 2017-01-25: qty 5

## 2017-01-25 MED ORDER — METHIMAZOLE 10 MG PO TABS
10.0000 mg | ORAL_TABLET | Freq: Every day | ORAL | Status: DC
  Administered 2017-01-26 – 2017-01-27 (×2): 10 mg via ORAL
  Filled 2017-01-25 (×2): qty 1

## 2017-01-25 MED ORDER — PANTOPRAZOLE SODIUM 40 MG PO TBEC
40.0000 mg | DELAYED_RELEASE_TABLET | Freq: Every day | ORAL | Status: DC
  Administered 2017-01-26 – 2017-01-27 (×2): 40 mg via ORAL
  Filled 2017-01-25 (×2): qty 1

## 2017-01-25 MED ORDER — RENA-VITE PO TABS
1.0000 | ORAL_TABLET | Freq: Every day | ORAL | Status: DC
  Administered 2017-01-26: 1 via ORAL
  Filled 2017-01-25: qty 1

## 2017-01-25 NOTE — Progress Notes (Signed)
Post hd vitals 

## 2017-01-25 NOTE — ED Notes (Addendum)
Bladder scanner showing >543mL. EDP notified.

## 2017-01-25 NOTE — ED Provider Notes (Signed)
[facility] Emergency Department Provider Note  ____________________________________________   First MD Initiated Contact with Patient 01/25/17 [phone]     (approximate)  I have reviewed the triage vital signs and the nursing notes.   HISTORY  Chief Complaint Shortness of Breath and Dizziness   HPI Juan Roberson. is a 46 y.o. male with a history of CHF as well as renal failure and recent spontaneous bacterial peritonitis was presenting to the emergency department today with worsening shortness of breath as well as not urinating over the past 4 days. He says his abdomen is full and distended. He said that he called his kidney doctor, Dr. Cherylann Ratel, who said that he would meet him in the emergency department upon his arrival to the hospital. Not reported any fever or cough. Says that the breathing difficulty worsens when he lies back.   Past Medical History:  Diagnosis Date  . Cellulitis and abscess of foot    Left-Dr. Ether Griffins  . CHF (congestive heart failure) (HCC)    EF 20%  . CKD (chronic kidney disease), stage III   . Diabetes mellitus without complication (HCC)    a. Dx ~ 1996.  [name] Dysrhythmia   . Essential hypertension   . Gastritis    a. 04/2015 hematemesis -> EGD: gastritis, esophagitis, duodenitis.  No active bleeding.  PPI added.  [name] GERD (gastroesophageal reflux disease)   . Left leg DVT (HCC)    a. Dx 05/2015 -> Coumadin.  . MI (myocardial infarction) (HCC)   . Osteomyelitis (HCC)    a. 05/2015 L foot.    Patient Active Problem List   Diagnosis Date Noted  . CHF exacerbation (HCC) 01/14/2017  . Acute on chronic renal failure (HCC) 01/09/2017  . SBP (spontaneous bacterial peritonitis) (HCC) 12/05/2016  . Hyperglycemia 10/28/2016  . Sepsis (HCC) 10/17/2016  . Chest pain 09/24/2016  . Acute-on-chronic kidney injury (HCC) 09/24/2016    Past Surgical History:  Procedure Laterality Date  . CARDIAC CATHETERIZATION Right 10/26/2016   Procedure: Left Heart Cath and Coronary Angiography;  Surgeon: Laurier Nancy, MD;  Location: [facility] INVASIVE CV LAB;  Service: Cardiovascular;  Laterality: Right;  . DIALYSIS/PERMA CATHETER INSERTION N/A 01/17/2017   Procedure: Dialysis/Perma Catheter Insertion;  Surgeon: Annice Needy, MD;  Location: [facility] INVASIVE CV LAB;  Service: Cardiovascular;  Laterality: N/A;  . DIALYSIS/PERMA CATHETER REMOVAL N/A 12/05/2016   Procedure: Dialysis/Perma Catheter Removal;  Surgeon: Renford Dills, MD;  Location: [facility] INVASIVE CV LAB;  Service: Cardiovascular;  Laterality: N/A;  . ESOPHAGOGASTRODUODENOSCOPY N/A 04/07/2015   Procedure: ESOPHAGOGASTRODUODENOSCOPY (EGD);  Surgeon: Midge Minium, MD;  Location: [facility] ENDOSCOPY;  Service: Endoscopy;  Laterality: N/A;  . ESOPHAGOGASTRODUODENOSCOPY (EGD) WITH PROPOFOL Left 06/30/2015   Procedure: ESOPHAGOGASTRODUODENOSCOPY (EGD) WITH PROPOFOL;  Surgeon: Christena Deem, MD;  Location: [facility] ENDOSCOPY;  Service: Endoscopy;  Laterality: Left;  . FOOT SURGERY    . I&D EXTREMITY Left 04/06/2015   Procedure: IRRIGATION AND DEBRIDEMENT EXTREMITY;  Surgeon: Gwyneth Revels, DPM;  Location: [facility] ORS;  Service: Podiatry;  Laterality: Left;  . IRRIGATION AND DEBRIDEMENT FOOT Left 05/21/2015   Procedure: IRRIGATION AND DEBRIDEMENT FOOT;  Surgeon: Linus Galas, MD;  Location: [facility] ORS;  Service: Podiatry;  Laterality: Left;  . IRRIGATION AND DEBRIDEMENT KNEE Right 10/18/2016   Procedure: IRRIGATION AND DEBRIDEMENT KNEE;  Surgeon: Deeann Saint, MD;  Location: [facility] ORS;  Service: Orthopedics;  Laterality: Right;  . KNEE ARTHROSCOPY  10/18/2016   Procedure: ARTHROSCOPY KNEE;  Surgeon: Deeann Saint, MD;  Location: [facility] ORS;  Service: Orthopedics;;  . PERIPHERAL VASCULAR CATHETERIZATION N/A 10/27/2016   Procedure: Dialysis/Perma Catheter Insertion;  Surgeon: Annice Needy, MD;  Location: [facility] INVASIVE CV LAB;  Service: Cardiovascular;  Laterality: N/A;    Prior to Admission medications     Medication Sig Start Date End Date Taking? Authorizing Provider  amiodarone (PACERONE) 200 MG tablet Take 1 tablet (200 mg total) by mouth daily. 09/25/16   Shaune Pollack, MD  aspirin EC 81 MG EC tablet Take 1 tablet (81 mg total) by mouth daily. 07/23/16   Alford Highland, MD  bisacodyl (DULCOLAX) 10 MG suppository Place 1 suppository (10 mg total) rectally daily as needed for moderate constipation. 10/27/16   Altamese Dilling, MD  BREO ELLIPTA 100-25 MCG/INH AEPB Inhale 1 puff into the lungs daily.  11/09/16   Historical Provider, MD  carvedilol (COREG) 3.125 MG tablet Take 1 tablet (3.125 mg total) by mouth 2 (two) times daily with a meal. 10/27/16   Altamese Dilling, MD  ciprofloxacin (CIPRO) 500 MG tablet Take 1 tablet (500 mg total) by mouth daily. 01/19/17   Enedina Finner, MD  furosemide (LASIX) 80 MG tablet Take 1 tablet (80 mg total) by mouth 2 (two) times daily. 10/31/16   Enid Baas, MD  insulin aspart (NOVOLOG) 100 UNIT/ML injection Inject 4 Units into the skin 3 (three) times daily with meals. 10/31/16   Enid Baas, MD  lisinopril (PRINIVIL,ZESTRIL) 5 MG tablet Take 1 tablet (5 mg total) by mouth daily. 01/20/17   Enedina Finner, MD  methimazole (TAPAZOLE) 10 MG tablet Take 1 tablet (10 mg total) by mouth daily. 07/22/16   Alford Highland, MD  multivitamin (RENA-VIT) TABS tablet Take 1 tablet by mouth at bedtime. 01/19/17   Enedina Finner, MD  Nutritional Supplements (FEEDING SUPPLEMENT, NEPRO CARB STEADY,) LIQD Take 237 mLs by mouth 2 (two) times daily between meals. 10/27/16   Altamese Dilling, MD  pantoprazole (PROTONIX) 40 MG tablet Take 1 tablet (40 mg total) by mouth daily. 09/25/16   Shaune Pollack, MD  spironolactone (ALDACTONE) 25 MG tablet Take 1 tablet (25 mg total) by mouth daily. 12/09/16   Enid Baas, MD  traMADol (ULTRAM) 50 MG tablet Take 50 mg by mouth every 6 (six) hours. 01/09/17   Historical Provider, MD  zolpidem (AMBIEN) 5 MG tablet Take 5 mg by mouth at  bedtime as needed for sleep.  11/14/16   Historical Provider, MD    Allergies Sulfur  Family History  Problem Relation Age of Onset  . Coronary artery disease Father   . Pancreatic cancer Mother   . Breast cancer Sister   . Lung cancer Brother   . Cervical cancer Sister     Social History Social History  Substance Use Topics  . Smoking status: Never Smoker  . Smokeless tobacco: Never Used  . Alcohol use No    Review of Systems  Constitutional: No fever/chills Eyes: No visual changes. ENT: No sore throat. Cardiovascular: Denies chest pain. Respiratory: short of breath Gastrointestinal: No abdominal pain.  No nausea, no vomiting.  No diarrhea.  No constipation. Genitourinary: Negative for dysuria. Musculoskeletal: Negative for back pain. Skin: Negative for rash. Neurological: Negative for headaches, focal weakness or numbness.   ____________________________________________   PHYSICAL EXAM:  VITAL SIGNS: ED Triage Vitals  Enc Vitals Group     BP 01/25/17 0849 111/62     Pulse Rate 01/25/17 0849 80     Resp 01/25/17 0849 18     Temp 01/25/17 0849  98.7 F (37.1 C)     Temp Source 01/25/17 0849 Oral     SpO2 01/25/17 0849 100 %     Weight 01/25/17 0852 178 lb (80.7 kg)     Height 01/25/17 0852  (1.88 m)     Head Circumference --      Peak Flow --      Pain Score 01/25/17 0852 10     Pain Loc --      Pain Edu? --      Excl. in GC? --     Constitutional: Alert and oriented. Well appearing and in no acute distress. Eyes: Conjunctivae are normal. PERRL. EOMI. Head: Atraumatic. Nose: No congestion/rhinnorhea. Mouth/Throat: Mucous membranes are moist.   Neck: No stridor.   Cardiovascular: Normal rate, regular rhythm. Grossly normal heart sounds.  Permacath to the left side of the chest Without any surrounding erythema or induration. Respiratory: Normal respiratory effort.  No retractions. No rales throughout. Decreased lung sounds to the right lung  field. Gastrointestinal: Soft with distention and fluid wave. Mild and diffuse tenderness but patient says it feels more like a "tightness." Musculoskeletal: No lower extremity tenderness nor edema.  No joint effusions. Neurologic:  Normal speech and language. No gross focal neurologic deficits are appreciated.  Skin:  Skin is warm, dry and intact. No rash noted. Psychiatric: Mood and affect are normal. Speech and behavior are normal.  ____________________________________________   LABS (all labs ordered are listed, but only abnormal results are displayed)  Labs Reviewed  BASIC METABOLIC PANEL - Abnormal; Notable for the following:       Result Value   Chloride 98 (*)    Glucose, Bld 121 (*)    BUN 39 (*)    Creatinine, Ser 4.27 (*)    Calcium 8.7 (*)    GFR calc non Af Amer 15 (*)    GFR calc Af Amer 18 (*)    All other components within normal limits  CBC - Abnormal; Notable for the following:    RBC 3.31 (*)    Hemoglobin 8.2 (*)    HCT 26.5 (*)    MCH 24.7 (*)    MCHC 30.8 (*)    RDW 18.5 (*)    All other components within normal limits  URINALYSIS, COMPLETE (UACMP) WITH MICROSCOPIC   ____________________________________________  EKG  ED ECG REPORT I, Arelia Longest, the attending physician, personally viewed and interpreted this ECG.   Date: 01/25/2017  EKG Time: 0900  Rate: 79  Rhythm: normal sinus rhythm  Axis: normal  Intervals:right bundle branch block  ST&T Change: ST segment elevation or depression. T-wave inversions in 2, 3 and aVF as well as V2 through V4 with biphasic T waves in V5 and V6.  ____________________________________________  RADIOLOGY  DG Chest 2 View (Final result)  Result time 01/25/17 10:32:39  Final result by Paulina Fusi, MD (01/25/17 10:32:39)           Narrative:   CLINICAL DATA: Dizziness and shortness of breath beginning yesterday.  EXAM: CHEST 2 VIEW  COMPARISON: 01/19/2017  FINDINGS: Central line is  unchanged. Tips are in the SVC just above the right atrium. Large right effusion persists with subtotal collapse of the right lung. This may be even slightly larger. The left chest remains clear. No pleural fluid on that side.  IMPRESSION: Large right effusion with subtotal collapse of the right lung. This is probably even slightly larger than was seen 6 days ago.   Electronically Signed  By: Paulina Fusi M.D. On: 01/25/2017 10:32          ____________________________________________   PROCEDURES  Procedure(s) performed:   Procedures  Critical Care performed:   ____________________________________________   INITIAL IMPRESSION / ASSESSMENT AND PLAN / ED COURSE  Pertinent labs & imaging results that were available during my care of the patient were reviewed by me and considered in my medical decision making (see chart for details).   ----------------------------------------- 11:09 AM on 01/25/2017 -----------------------------------------  No Respiratory distress. However, with large pleural effusion and symptomatic. Also with urinary retention with a 500 cc of urine in the bladder. Patient will be admitted to the hospital. [facility] nephrology but awaiting callback at this time. Signed out to Dr. Cherlynn Kaiser of the medicine service. Patient aware of the diagnosis and need for admission to the hospital. He is understanding the plan and willing to comply.     ____________________________________________   FINAL CLINICAL IMPRESSION(S) / ED DIAGNOSES  Pleural effusion. Shortness of breath. Urinary retention. An urea.    NEW MEDICATIONS STARTED DURING THIS VISIT:  New Prescriptions   No medications on file     Note:  This document was prepared using Dragon voice recognition software and may include unintentional dictation errors.    Myrna Blazer, MD 01/25/17 1110

## 2017-01-25 NOTE — H&P (Signed)
[facility] [place] [facility]   PATIENT NAME: Juan Roberson    MR#:  [id number]  DATE OF BIRTH:  11-Jul-1971  DATE OF ADMISSION:  01/25/2017  PRIMARY CARE PHYSICIAN: Corky Downs, MD   REQUESTING/REFERRING PHYSICIAN: Dr. Gladstone Pih  CHIEF COMPLAINT:   Chief Complaint  Patient presents with  . Shortness of Breath  . Dizziness    HISTORY OF PRESENT ILLNESS:  Juan Roberson  is a 46 y.o. 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 Patient was recently discharged from the hospital a few days back after being diagnosed with spontaneous pectoral peritonitis on oral antibiotics and also newly being initiated on hemodialysis. He said he was feeling well Tuesday after his dialysis but since yesterday he has become more short of breath and dizzy and having some abdominal pain that he had when he was admitted recently. He presents to the ER and was noted to be in acute respiratory failure with hypoxia and noted to have a large right-sided pleural effusion. He denies any fevers, chills, cough, congestion.  He admits to some nausea and dry heaving this morning. Hospitalist services were called in for treatment and evaluation.  PAST MEDICAL HISTORY:   Past Medical History:  Diagnosis Date  . Cellulitis and abscess of foot    Left-Dr. Ether Griffins  . CHF (congestive heart failure) (HCC)    EF 20%  . CKD (chronic kidney disease), stage III   . Diabetes mellitus without complication (HCC)    a. Dx ~ 1996.  [name] Dysrhythmia   . Essential hypertension   . Gastritis    a. 04/2015 hematemesis -> EGD: gastritis, esophagitis, duodenitis.  No active bleeding.  PPI added.  [name] GERD (gastroesophageal reflux disease)   . Left leg DVT (HCC)    a. Dx 05/2015 -> Coumadin.   . MI (myocardial infarction) (HCC)   . Osteomyelitis (HCC)    a. 05/2015 L foot.    PAST SURGICAL HISTORY:   Past Surgical History:  Procedure Laterality Date  . CARDIAC CATHETERIZATION Right 10/26/2016   Procedure: Left Heart Cath and Coronary Angiography;  Surgeon: Laurier Nancy, MD;  Location: [facility] INVASIVE CV LAB;  Service: Cardiovascular;  Laterality: Right;  . DIALYSIS/PERMA CATHETER INSERTION N/A 01/17/2017   Procedure: Dialysis/Perma Catheter Insertion;  Surgeon: Annice Needy, MD;  Location: [facility] INVASIVE CV LAB;  Service: Cardiovascular;  Laterality: N/A;  . DIALYSIS/PERMA CATHETER REMOVAL N/A 12/05/2016   Procedure: Dialysis/Perma Catheter Removal;  Surgeon: Renford Dills, MD;  Location: [facility] INVASIVE CV LAB;  Service: Cardiovascular;  Laterality: N/A;  . ESOPHAGOGASTRODUODENOSCOPY N/A 04/07/2015   Procedure: ESOPHAGOGASTRODUODENOSCOPY (EGD);  Surgeon: Midge Minium, MD;  Location[facility] ENDOSCOPY;  Service: Endoscopy;  Laterality: N/A;  . ESOPHAGOGASTRODUODENOSCOPY (EGD) WITH PROPOFOL Left 06/30/2015   Procedure: ESOPHAGOGASTRODUODENOSCOPY (EGD) WITH PROPOFOL;  Surgeon: Christena Deem, MD;  Location: [facility] ENDOSCOPY;  Service: Endoscopy;  Laterality: Left;  . FOOT SURGERY    . I&D EXTREMITY Left 04/06/2015   Procedure: IRRIGATION AND DEBRIDEMENT EXTREMITY;  Surgeon: Gwyneth Revels, DPM;  Location: [facility] ORS;  Service: Podiatry;  Laterality: Left;  . IRRIGATION AND DEBRIDEMENT FOOT Left 05/21/2015   Procedure: IRRIGATION AND DEBRIDEMENT FOOT;  Surgeon: Linus Galas, MD;  Location: [facility] ORS;  Service: Podiatry;  Laterality: Left;  . IRRIGATION AND DEBRIDEMENT KNEE Right  10/18/2016   Procedure: IRRIGATION AND DEBRIDEMENT KNEE;  Surgeon: Deeann Saint, MD;  Location: [facility] ORS;  Service: Orthopedics;  Laterality: Right;  . KNEE ARTHROSCOPY  10/18/2016   Procedure: ARTHROSCOPY KNEE;  Surgeon: Deeann Saint, MD;  Location: [facility] ORS;  Service: Orthopedics;;  . PERIPHERAL VASCULAR CATHETERIZATION  N/A 10/27/2016   Procedure: Dialysis/Perma Catheter Insertion;  Surgeon: Annice Needy, MD;  Location: [facility] INVASIVE CV LAB;  Service: Cardiovascular;  Laterality: N/A;    SOCIAL HISTORY:   Social History  Substance Use Topics  . Smoking status: Never Smoker  . Smokeless tobacco: Never Used  . Alcohol use No    FAMILY HISTORY:   Family History  Problem Relation Age of Onset  . Coronary artery disease Father   . Pancreatic cancer Mother   . Breast cancer Sister   . Lung cancer Brother   . Cervical cancer Sister     DRUG ALLERGIES:   Allergies  Allergen Reactions  . Sulfur Other (See Comments)    Pt states that this medication causes renal failure.      REVIEW OF SYSTEMS:   Review of Systems  Constitutional: Negative for fever and weight loss.  HENT: Negative for congestion, nosebleeds and tinnitus.   Eyes: Negative for blurred vision, double vision and redness.  Respiratory: Positive for shortness of breath. Negative for cough and hemoptysis.   Cardiovascular: Negative for chest pain, orthopnea, leg swelling and PND.  Gastrointestinal: Positive for abdominal pain, nausea and vomiting. Negative for diarrhea and melena.  Genitourinary: Negative for dysuria, hematuria and urgency.  Musculoskeletal: Negative for falls and joint pain.  Neurological: Positive for dizziness. Negative for tingling, sensory change, focal weakness, seizures, weakness and headaches.  Endo/Heme/Allergies: Negative for polydipsia. Does not bruise/bleed easily.  Psychiatric/Behavioral: Negative for depression and memory loss. The patient is not nervous/anxious.     MEDICATIONS AT HOME:   Prior to Admission medications   Medication Sig Start Date End Date Taking? Authorizing Provider  amiodarone (PACERONE) 200 MG tablet Take 1 tablet (200 mg total) by mouth daily. 09/25/16  Yes Shaune Pollack, MD  aspirin EC 81 MG EC tablet Take 1 tablet (81 mg total) by mouth daily. 07/23/16  Yes Richard Renae Gloss, MD   bisacodyl (DULCOLAX) 10 MG suppository Place 1 suppository (10 mg total) rectally daily as needed for moderate constipation. 10/27/16  Yes Altamese Dilling, MD  BREO ELLIPTA 100-25 MCG/INH AEPB Inhale 1 puff into the lungs daily.  11/09/16  Yes Historical Provider, MD  carvedilol (COREG) 3.125 MG tablet Take 1 tablet (3.125 mg total) by mouth 2 (two) times daily with a meal. 10/27/16  Yes Altamese Dilling, MD  ciprofloxacin (CIPRO) 500 MG tablet Take 1 tablet (500 mg total) by mouth daily. 01/19/17  Yes Enedina Finner, MD  furosemide (LASIX) 80 MG tablet Take 1 tablet (80 mg total) by mouth 2 (two) times daily. 10/31/16  Yes Enid Baas, MD  insulin aspart (NOVOLOG) 100 UNIT/ML injection Inject 4 Units into the skin 3 (three) times daily with meals. 10/31/16  Yes Enid Baas, MD  lisinopril (PRINIVIL,ZESTRIL) 5 MG tablet Take 1 tablet (5 mg total) by mouth daily. 01/20/17  Yes Enedina Finner, MD  methimazole (TAPAZOLE) 10 MG tablet Take 1 tablet (10 mg total) by mouth daily. 07/22/16  Yes Alford Highland, MD  multivitamin (RENA-VIT) TABS tablet Take 1 tablet by mouth at bedtime. 01/19/17  Yes Enedina Finner, MD  pantoprazole (PROTONIX) 40 MG tablet Take 1 tablet (40 mg total) by mouth daily. 09/25/16  Yes Shaune Pollack, MD  spironolactone (ALDACTONE) 25 MG tablet Take 1 tablet (25 mg total) by mouth daily. 12/09/16  Yes Enid Baas, MD  traMADol (ULTRAM) 50 MG tablet Take 50 mg by mouth daily as needed.  01/09/17  Yes Historical Provider, MD  Nutritional Supplements (FEEDING SUPPLEMENT, NEPRO CARB STEADY,) LIQD Take 237 mLs by mouth 2 (two) times daily between meals. Patient not taking: Reported on 01/25/2017 10/27/16   Altamese Dilling, MD  zolpidem (AMBIEN) 5 MG tablet Take 5 mg by mouth at bedtime as needed for sleep.  11/14/16   Historical Provider, MD      VITAL SIGNS:  Blood pressure 118/75, pulse 78, temperature 98.7 F (37.1 C), temperature source Oral, resp. rate 10, height   (1.88 m), weight 80.7 kg (178 lb), SpO2 100 %.  PHYSICAL EXAMINATION:  Physical Exam  GENERAL:  46 y.o.-year-old patient lying in the bed with no acute distress.  EYES: Pupils equal, round, reactive to light and accommodation. No scleral icterus. Extraocular muscles intact.  HEENT: Head atraumatic, normocephalic. Oropharynx and nasopharynx clear. No oropharyngeal erythema, moist oral mucosa  NECK:  Supple, no jugular venous distention. No thyroid enlargement, no tenderness.  LUNGS: Poor A/e on right mid to lower lung fields, no wheezing, rales, rhonchi. No use of accessory muscles of respiration.  CARDIOVASCULAR: S1, S2 RRR. No murmurs, rubs, gallops, clicks.  ABDOMEN: Soft, nontender, slightly distended + fluid wave. Bowel sounds present. No organomegaly or mass.  EXTREMITIES: + 2 edema b/l, No cyanosis, or clubbing. + 2 pedal & radial pulses b/l.   NEUROLOGIC: Cranial nerves II through XII are intact. No focal Motor or sensory deficits appreciated b/l PSYCHIATRIC: The patient is alert and oriented x 3. Good affect.  SKIN: No obvious rash, lesion, or ulcer.   LABORATORY PANEL:   CBC  Recent Labs Lab 01/25/17 0854  WBC 7.3  HGB 8.2*  HCT 26.5*  PLT 244   ------------------------------------------------------------------------------------------------------------------  Chemistries   Recent Labs Lab 01/25/17 0854  NA 138  K 4.3  CL 98*  CO2 29  GLUCOSE 121*  BUN 39*  CREATININE 4.27*  CALCIUM 8.7*   ------------------------------------------------------------------------------------------------------------------  Cardiac Enzymes No results for input(s): TROPONINI in the last 168 hours. ------------------------------------------------------------------------------------------------------------------  RADIOLOGY:  Dg Chest 2 View  Result Date: 01/25/2017 CLINICAL DATA:  Dizziness and shortness of breath beginning yesterday. EXAM: CHEST  2 VIEW COMPARISON:  01/19/2017  FINDINGS: Central line is unchanged. Tips are in the SVC just above the right atrium. Large right effusion persists with subtotal collapse of the right lung. This may be even slightly larger. The left chest remains clear. No pleural fluid on that side. IMPRESSION: Large right effusion with subtotal collapse of the right lung. This is probably even slightly larger than was seen 6 days ago. Electronically Signed   By: Paulina Fusi M.D.   On: 01/25/2017 10:32     IMPRESSION AND PLAN:   46 year old male with past medical history of severe ischemic cardiac myopathy ejection fraction of 10%, chronic systolic CHF, end-stage renal disease on hemodialysis, history of osteomyelitis, GERD, hypertension, diabetes who presents to the hospital due to shortness of breath and dizziness.  1. Right-sided pleural effusion-this is a cause of patient's shortness of breath.  -I will order a ultrasound-guided thoracentesis. Will order fluid studies and follow it up. -Unclear if this is infectious versus related to CHF.  2. End-stage renal disease on hemodialysis-patient is on a Tuesday Thursday Saturday schedule. [facility] consult nephrology, patient will  likely need hemodialysis.  3. History of ascites with SBP-patient is currently complaining of abdominal pain. He had a recent paracentesis and the fluid was consistent with SBP. -I will continue ciprofloxacin, he is currently afebrile and if this pain persists would consider doing a repeat paracentesis but hold off on now.  4. Severe ischemic cardiomyopathy with ejection fraction of 10%-continue carvedilol, lisinopril, Aldactone, Lasix.  5. GERD-continue Protonix.  6. COPD-no acute exacerbation-continue Breo Ellipta  7. Hyperthyroidism - cont. Methimazole.    All the records are reviewed and case discussed with ED provider. Management plans discussed with the patient, family and they are in agreement.  CODE STATUS: Full code  TOTAL TIME TAKING CARE OF THIS  PATIENT: 45 minutes.    Houston Siren M.D on 01/25/2017 at 11:39 AM  Between 7am to 6pm - Pager - [phone]  After 6pm go to [email or web address] - password [email or web address] [facility]  [facility] [facility]  Office  [phone]  CC: Primary care physician; Corky Downs, MD

## 2017-01-25 NOTE — Progress Notes (Signed)
Pre hd assessment  

## 2017-01-25 NOTE — Progress Notes (Signed)
Hd start 

## 2017-01-25 NOTE — ED Notes (Signed)
Spoke to attending MD Cherlynn Kaiser) about Foley catheter. MD spoke with pt's nephrologist Dr. Cherylann Ratel who does want pt to have a Foley placed at this time. Pt bladder scan showed >551mL. Pt able to urinate into urinal. Foley still desired at this time. Pt updated and in agreement with plan.

## 2017-01-25 NOTE — Progress Notes (Signed)
Pre hd info 

## 2017-01-25 NOTE — ED Triage Notes (Signed)
Patient presents to the ED with dizziness that started yesterday and states that he can only breathe when sitting straight up, if he tries to lie down at all he gets short of breath.  Patient was discharged from the hospital on Friday after being diagnosed with acute renal failure and placed on dialysis.  Patient reports history of CHF.  Patient states he is supposed to have dialysis today but he called the dialysis center and told them his symptoms and was instructed to come to the ED.  Patient states his nephrologist, Dr. Cherylann Ratel, told him he would come to see him in the ED.  Patient is also complaining of abdominal fluid/swelling and states he has not urinated in 4 days.

## 2017-01-25 NOTE — Progress Notes (Signed)
  End of hd 

## 2017-01-25 NOTE — Procedures (Signed)
Ultrasound-guided diagnostic and therapeutic right thoracentesis performed yielding 2 liters of yellow  fluid. No immediate complications. Follow-up chest x-ray pending. A portion of the fluid was sent to the lab for preordered studies. Due to this being pt's initial thoracentesis only the above amount of fluid was removed today.

## 2017-01-25 NOTE — Progress Notes (Signed)
Post hd assessment 

## 2017-01-26 ENCOUNTER — Inpatient Hospital Stay: Payer: BLUE CROSS/BLUE SHIELD

## 2017-01-26 LAB — CBC
HEMATOCRIT: 23.9 % — AB (ref 40.0–52.0)
Hemoglobin: 7.5 g/dL — ABNORMAL LOW (ref 13.0–18.0)
MCH: 24.7 pg — AB (ref 26.0–34.0)
MCHC: 31.5 g/dL — ABNORMAL LOW (ref 32.0–36.0)
MCV: 78.5 fL — AB (ref 80.0–100.0)
PLATELETS: 209 10*3/uL (ref 150–440)
RBC: 3.05 MIL/uL — ABNORMAL LOW (ref 4.40–5.90)
RDW: 18.2 % — AB (ref 11.5–14.5)
WBC: 6.8 10*3/uL (ref 3.8–10.6)

## 2017-01-26 LAB — PATHOLOGIST SMEAR REVIEW

## 2017-01-26 LAB — GLUCOSE, CAPILLARY
Glucose-Capillary: 124 mg/dL — ABNORMAL HIGH (ref 65–99)
Glucose-Capillary: 174 mg/dL — ABNORMAL HIGH (ref 65–99)
Glucose-Capillary: 121 mg/dL — ABNORMAL HIGH (ref 65–99)
Glucose-Capillary: 112 mg/dL — ABNORMAL HIGH (ref 65–99)

## 2017-01-26 LAB — BASIC METABOLIC PANEL
Anion gap: 5 (ref 5–15)
BUN: 28 mg/dL — AB (ref 6–20)
CO2: 30 mmol/L (ref 22–32)
CREATININE: 3.03 mg/dL — AB (ref 0.61–1.24)
Calcium: 7.7 mg/dL — ABNORMAL LOW (ref 8.9–10.3)
Chloride: 103 mmol/L (ref 101–111)
GFR calc Af Amer: 27 mL/min — ABNORMAL LOW (ref >60)
GFR calc non Af Amer: 23 mL/min — ABNORMAL LOW (ref >60)
GLUCOSE: 147 mg/dL — AB (ref 65–99)
POTASSIUM: 4 mmol/L (ref 3.5–5.1)
SODIUM: 138 mmol/L (ref 135–145)

## 2017-01-26 MED ORDER — TRAMADOL HCL 50 MG PO TABS
50.0000 mg | ORAL_TABLET | Freq: Three times a day (TID) | ORAL | Status: DC | PRN
  Administered 2017-01-26 (×2): 50 mg via ORAL
  Filled 2017-01-26 (×2): qty 1

## 2017-01-26 MED ORDER — PREMIER PROTEIN SHAKE
11.0000 [oz_av] | Freq: Two times a day (BID) | ORAL | Status: DC
  Administered 2017-01-26: 11 [oz_av] via ORAL

## 2017-01-26 NOTE — Progress Notes (Signed)
[facility] - [place] at [facility]   PATIENT NAME: Juan Roberson    MR#:  [id number]  DATE OF BIRTH:  04/08/1971  SUBJECTIVE:  Came in with increasing sob and Found to have right-sided large pleural effusion status post thoracentesis patient. We'll look better. Complains of abdominal tightness. He recently had paracentesis done on 01/15/2017.  Breathing improved.  REVIEW OF SYSTEMS:   Review of Systems  Constitutional: Negative for chills, fever and weight loss.  HENT: Negative for ear discharge, ear pain and nosebleeds.   Eyes: Negative for blurred vision, pain and discharge.  Respiratory: Positive for shortness of breath. Negative for sputum production, wheezing and stridor.   Cardiovascular: Negative for chest pain, palpitations, orthopnea and PND.  Gastrointestinal: Negative for abdominal pain, diarrhea, nausea and vomiting.  Genitourinary: Negative for frequency and urgency.  Musculoskeletal: Negative for back pain and joint pain.  Neurological: Positive for weakness. Negative for sensory change, speech change and focal weakness.  Psychiatric/Behavioral: Negative for depression and hallucinations. The patient is not nervous/anxious.    Tolerating Diet: Tolerating PT:   DRUG ALLERGIES:   Allergies  Allergen Reactions  . Sulfur Other (See Comments)    Pt states that this medication causes renal failure.      VITALS:  Blood pressure (!) 85/52, pulse 80, temperature 99.2 F (37.3 C), temperature source Oral, resp. rate 18, height  (1.88 m), weight 84.5 kg (186 lb 4.6 oz), SpO2 94 %.  PHYSICAL EXAMINATION:   Physical Exam  GENERAL:  46 y.o.-year-old patient lying in the bed with no acute distress. Appears chronically ill EYES: Pupils equal, round, reactive to light and accommodation. No scleral icterus. Extraocular muscles intact.  HEENT: Head atraumatic, normocephalic. Oropharynx and nasopharynx clear.  NECK:  Supple, no jugular venous  distention. No thyroid enlargement, no tenderness. Left HD catheter present LUNGS: Normal breath sounds bilaterally, no wheezing, rales, rhonchi. No use of accessory muscles of respiration.  CARDIOVASCULAR: S1, S2 normal. No murmurs, rubs, or gallops.  ABDOMEN: Soft, nontender, distended. Bowel sounds present. No organomegaly or mass. Ascites plus EXTREMITIES: No cyanosis, clubbing or edema b/l.    NEUROLOGIC: Cranial nerves II through XII are intact. No focal Motor or sensory deficits b/l.   PSYCHIATRIC:  patient is alert and oriented x 3.  SKIN: No obvious rash, lesion, or ulcer.   LABORATORY PANEL:  CBC  Recent Labs Lab 01/26/17 0430  WBC 6.8  HGB 7.5*  HCT 23.9*  PLT 209    Chemistries   Recent Labs Lab 01/26/17 0430  NA 138  K 4.0  CL 103  CO2 30  GLUCOSE 147*  BUN 28*  CREATININE 3.03*  CALCIUM 7.7*   Cardiac Enzymes No results for input(s): TROPONINI in the last 168 hours. RADIOLOGY:  Dg Chest 1 View  Result Date: 01/25/2017 CLINICAL DATA:  Status post right thoracentesis today. EXAM: CHEST 1 VIEW COMPARISON:  PA and lateral chest 01/25/2017. FINDINGS: Large right pleural effusion is somewhat decreased after thoracentesis. No pneumothorax. Right basilar airspace disease is unchanged. Left lung demonstrates a small focus of linear atelectasis or scar, unchanged. Cardiac silhouette is largely obscured. Dialysis catheter noted. IMPRESSION: Some decrease in a right pleural effusion after thoracentesis. Negative for pneumothorax or other new abnormality. Electronically Signed   By: Drusilla Kanner M.D.   On: 01/25/2017 16:01   Dg Chest 2 View  Result Date: 01/25/2017 CLINICAL DATA:  Dizziness and shortness of breath beginning yesterday. EXAM: CHEST  2 VIEW COMPARISON:  01/19/2017 FINDINGS: Central line is unchanged. Tips are in the SVC just above the right atrium. Large right effusion persists with subtotal collapse of the right lung. This may be even slightly larger.  The left chest remains clear. No pleural fluid on that side. IMPRESSION: Large right effusion with subtotal collapse of the right lung. This is probably even slightly larger than was seen 6 days ago. Electronically Signed   By: Paulina Fusi M.D.   On: 01/25/2017 10:32   US Thoracentesis Asp Pleural Space W/img Guide  Result Date: 01/25/2017 INDICATION: End-stage renal disease, cardiomyopathy, dyspnea, right pleural effusion; request made for diagnostic and therapeutic right thoracentesis. EXAM: ULTRASOUND GUIDED DIAGNOSTIC AND THERAPEUTIC RIGHT THORACENTESIS MEDICATIONS: None. COMPLICATIONS: None immediate. PROCEDURE: An ultrasound guided thoracentesis was thoroughly discussed with the patient and questions answered. The benefits, risks, alternatives and complications were also discussed. The patient understands and wishes to proceed with the procedure. Written consent was obtained. Ultrasound was performed to localize and mark an adequate pocket of fluid in the right chest. The area was then prepped and draped in the normal sterile fashion. 1% Lidocaine was used for local anesthesia. Under ultrasound guidance a Safe-T-Centesis catheter was introduced. Thoracentesis was performed. The catheter was removed and a dressing applied. FINDINGS: A total of approximately 2 liters of yellow fluid was removed. Samples were sent to the laboratory as requested by the clinical team. IMPRESSION: Successful ultrasound guided diagnostic and therapeutic right thoracentesis yielding 2 liters of pleural fluid. Read by: Jeananne Rama, PA-C Electronically Signed   By: Simonne Come M.D.   On: 01/25/2017 15:42   ASSESSMENT AND PLAN:   46 year old male with past medical history of severe ischemic cardiac myopathy ejection fraction of 30%, chronic systolic CHF, end-stage renal disease on hemodialysis, history of osteomyelitis, GERD, hypertension, diabetes who presents to the hospital due to shortness of breath and dizziness.  1.  Right-sided pleural effusion-this is a cause of patient's shortness of breath.  -Status post thoracentesis with removal of 1.2 L of fluid. Sats are more than 92% on room air. Patient feels a lot better. -Appears transudative fluid likely secondary to acute on chronic systolic congestive heart failure -Echo done in April 2018 shows EF of 30%  2. End-stage renal disease on hemodialysis-patient is on a Tuesday Thursday Saturday schedule. -Nephrology consultation for in-house dialysis  3. History of ascites with SBP-patient is currently complaining of abdominal pain. He had a recent paracentesis and the fluid was consistent with SBP. - will continue ciprofloxacin, he is currently afebrile  -Consider paracentesis therapeutic today  4. Severe ischemic cardiomyopathy with ejection fraction of 30% -continue carvedilol, lisinopril, Aldactone, Lasix.  5. GERD-continue Protonix.  6. COPD-no acute exacerbation-continue Breo Ellipta  7. Anemia of chronic disease, multifactorial No active bleeding ? Procrit/Neupogen at dialysis discussed with Dr. Cherylann Ratel  Case discussed with Care Management/Social Worker. Management plans discussed with the patient, family and they are in agreement.  CODE STATUS:  Full DVT Prophylaxis: heparin  TOTAL TIME TAKING CARE OF THIS PATIENT: 30 minutes.  >50% time spent on counselling and coordination of care  POSSIBLE D/C IN 1 DAYS, DEPENDING ON CLINICAL CONDITION.  Note: This dictation was prepared with Dragon dictation along with smaller phrase technology. Any transcriptional errors that result from this process are unintentional.  Azaleah Usman M.D on 01/26/2017 at 11:04 AM  Between 7am to 6pm - Pager - [phone]  After 6pm go to [email or web address] - Social research officer, government  [employer]  [phone]  CC: Primary care physician; Corky Downs, MD

## 2017-01-26 NOTE — Progress Notes (Signed)
Initial Nutrition Assessment  DOCUMENTATION CODES:   Not applicable  INTERVENTION:   -Recommend Premier Protein BID -If phosphorus remains low, may benefit from liberalizing diet    NUTRITION DIAGNOSIS:   Increased nutrient needs related to chronic illness as evidenced by estimated needs.   GOAL:   Patient will meet greater than or equal to 90% of their needs  MONITOR:   PO intake, Supplement acceptance, Labs, Weight trends  REASON FOR ASSESSMENT:   Malnutrition Screening Tool    ASSESSMENT:   46 yo male admitted with right sided pleural effusion s/p thoracentesis. Pt with ESRD on HD, ascites with SBP with recent paracentesis, severe ischemic CM with EF 10%   Pt reports appetite has been very good at home; eating 3 meals per day and drinking 2 Premier Protein shakes per day. Pt reports weight has been stable. Pt verbalizes understanding of renal diet.   Labs: phosphorus 2.2 Meds: Rena-Vit  Diet Order:  Diet renal/carb modified with fluid restriction Diet-HS Snack? Nothing; Room service appropriate? Yes; Fluid consistency: Thin  Skin:  Reviewed, no issues  Last BM:  01/25/17  Height:   Ht Readings from Last 1 Encounters:  01/25/17  (1.88 m)    Weight:   Wt Readings from Last 1 Encounters:  01/25/17 186 lb 4.6 oz (84.5 kg)    BMI:  Body mass index is 23.92 kg/m.  Estimated Nutritional Needs:   Kcal:  2200-2500 kcals  Protein:  110-130 g  Fluid:  UOP plus 1000 mL  EDUCATION NEEDS:   Education needs addressed  Romelle Starcher MS, RD, LDN [phone] Pager  [phone] Weekend/On-Call Pager

## 2017-01-26 NOTE — Progress Notes (Signed)
[facility]  ROUNDING NOTE   Subjective:  Patient well-known to [place].  Next line he presented with significant shortness of breath. He was found to have a pleural effusion. He is status post thoracentesis and is breathing much better. He also has abdominal distention and will be undergoing paracentesis today. Patient underwent hemodialysis yesterday and tolerated this quite well.  Objective:  Vital signs in last 24 hours:  Temp:  [98.1 F (36.7 C)-99.5 F (37.5 C)] 99.2 F (37.3 C) (04/27 0442) Pulse Rate:  [75-92] 79 (04/27 1147) Resp:  [14-22] 18 (04/27 0442) BP: (85-122)/(52-81) 100/58 (04/27 1147) SpO2:  [91 %-100 %] 94 % (04/27 0442) Weight:  [84.5 kg (186 lb 4.6 oz)-86 kg (189 lb 9.5 oz)] 84.5 kg (186 lb 4.6 oz) (04/26 2015)  Weight change:  Filed Weights   01/25/17 0852 01/25/17 1705 01/25/17 2015  Weight: 80.7 kg (178 lb) 86 kg (189 lb 9.5 oz) 84.5 kg (186 lb 4.6 oz)    Intake/Output: I/O last 3 completed shifts: In: 240 [P.O.:240] Out: 1900 [Urine:400; Other:1500]   Intake/Output this shift:  Total I/O In: 240 [P.O.:240] Out: 50 [Urine:50]  Physical Exam: General: No acute distress  Head: Normocephalic, atraumatic. Moist oral mucosal membranes  Eyes: Anicteric  Neck: Supple, trachea midline  Lungs:  diminised at bases, normal effort  Heart: S1S2 no rubs  Abdomen:  Soft, distended, non tender  Extremities: trace peripheral edema.  Neurologic: Awake, alert, following commands  Skin: No lesions  Access: L IJ permcath    Basic Metabolic Panel:  Recent Labs Lab 01/25/17 0854 01/25/17 1930 01/26/17 0430  NA 138  --  138  K 4.3  --  4.0  CL 98*  --  103  CO2 29  --  30  GLUCOSE 121*  --  147*  BUN 39*  --  28*  CREATININE 4.27*  --  3.03*  CALCIUM 8.7*  --  7.7*  PHOS  --  2.2*  --     Liver Function Tests: No results for input(s): AST, ALT, ALKPHOS, BILITOT, PROT, ALBUMIN in the last 168 hours. No results for input(s): LIPASE,  AMYLASE in the last 168 hours. No results for input(s): AMMONIA in the last 168 hours.  CBC:  Recent Labs Lab 01/25/17 0854 01/26/17 0430  WBC 7.3 6.8  HGB 8.2* 7.5*  HCT 26.5* 23.9*  MCV 80.2 78.5*  PLT 244 209    Cardiac Enzymes: No results for input(s): CKTOTAL, CKMB, CKMBINDEX, TROPONINI in the last 168 hours.  BNP: Invalid input(s): POCBNP  CBG:  Recent Labs Lab 01/19/17 1651 01/25/17 1316 01/25/17 2120 01/26/17 0737 01/26/17 1127  GLUCAP 102* 96 126* 124* 174*    Microbiology: Results for orders placed or performed during the hospital encounter of 01/25/17  Body fluid culture     Status: None (Preliminary result)   Collection Time: 01/25/17  3:20 PM  Result Value Ref Range Status   Specimen Description PLEURAL  Final   Special Requests NONE  Final   Gram Stain   Final    RARE WBC PRESENT, PREDOMINANTLY MONONUCLEAR NO ORGANISMS SEEN    Culture   Final    NO GROWTH < 24 HOURS Performed at [facility], [address], [place], [place] [address]    Report Status PENDING  Incomplete    Coagulation Studies: No results for input(s): LABPROT, INR in the last 72 hours.  Urinalysis:  Recent Labs  01/25/17 0854  COLORURINE AMBER*  LABSPEC 1.012  PHURINE 5.0  GLUCOSEU NEGATIVE  HGBUR SMALL*  BILIRUBINUR NEGATIVE  KETONESUR NEGATIVE  PROTEINUR 30*  NITRITE NEGATIVE  LEUKOCYTESUR NEGATIVE      Imaging: Dg Chest 1 View  Result Date: 01/25/2017 CLINICAL DATA:  Status post right thoracentesis today. EXAM: CHEST 1 VIEW COMPARISON:  PA and lateral chest 01/25/2017. FINDINGS: Large right pleural effusion is somewhat decreased after thoracentesis. No pneumothorax. Right basilar airspace disease is unchanged. Left lung demonstrates a small focus of linear atelectasis or scar, unchanged. Cardiac silhouette is largely obscured. Dialysis catheter noted. IMPRESSION: Some decrease in a right pleural effusion after thoracentesis. Negative for pneumothorax  or other new abnormality. Electronically Signed   By: Drusilla Kanner M.D.   On: 01/25/2017 16:01   Dg Chest 2 View  Result Date: 01/25/2017 CLINICAL DATA:  Dizziness and shortness of breath beginning yesterday. EXAM: CHEST  2 VIEW COMPARISON:  01/19/2017 FINDINGS: Central line is unchanged. Tips are in the SVC just above the right atrium. Large right effusion persists with subtotal collapse of the right lung. This may be even slightly larger. The left chest remains clear. No pleural fluid on that side. IMPRESSION: Large right effusion with subtotal collapse of the right lung. This is probably even slightly larger than was seen 6 days ago. Electronically Signed   By: Paulina Fusi M.D.   On: 01/25/2017 10:32   US Thoracentesis Asp Pleural Space W/img Guide  Result Date: 01/25/2017 INDICATION: End-stage renal disease, cardiomyopathy, dyspnea, right pleural effusion; request made for diagnostic and therapeutic right thoracentesis. EXAM: ULTRASOUND GUIDED DIAGNOSTIC AND THERAPEUTIC RIGHT THORACENTESIS MEDICATIONS: None. COMPLICATIONS: None immediate. PROCEDURE: An ultrasound guided thoracentesis was thoroughly discussed with the patient and questions answered. The benefits, risks, alternatives and complications were also discussed. The patient understands and wishes to proceed with the procedure. Written consent was obtained. Ultrasound was performed to localize and mark an adequate pocket of fluid in the right chest. The area was then prepped and draped in the normal sterile fashion. 1% Lidocaine was used for local anesthesia. Under ultrasound guidance a Safe-T-Centesis catheter was introduced. Thoracentesis was performed. The catheter was removed and a dressing applied. FINDINGS: A total of approximately 2 liters of yellow fluid was removed. Samples were sent to the laboratory as requested by the clinical team. IMPRESSION: Successful ultrasound guided diagnostic and therapeutic right thoracentesis yielding 2  liters of pleural fluid. Read by: Jeananne Rama, PA-C Electronically Signed   By: Simonne Come M.D.   On: 01/25/2017 15:42     Medications:    . amiodarone  200 mg Oral Daily  . aspirin EC  81 mg Oral Daily  . carvedilol  3.125 mg Oral BID WC  . ciprofloxacin  500 mg Oral q1800  . fluticasone furoate-vilanterol  1 puff Inhalation Daily  . furosemide  80 mg Oral BID  . heparin  5,000 Units Subcutaneous Q8H  . insulin aspart  0-5 Units Subcutaneous QHS  . insulin aspart  0-9 Units Subcutaneous TID WC  . insulin aspart  4 Units Subcutaneous TID WC  . lisinopril  5 mg Oral Daily  . methimazole  10 mg Oral Daily  . multivitamin  1 tablet Oral QHS  . pantoprazole  40 mg Oral Daily  . spironolactone  25 mg Oral Daily   acetaminophen **OR** acetaminophen, alteplase, bisacodyl, heparin, lidocaine (PF), lidocaine-prilocaine, ondansetron **OR** ondansetron (ZOFRAN) IV, pentafluoroprop-tetrafluoroeth  Assessment/ Plan:  46 y.o. male with hypertension, diabetes mellitus type 2, chronic kidney disease stage III, peripheral neuropathy  TTHS/[name]   1. ESRD on HD TTHS:  Patient completed hemodialysis yesterday.  No acute indication for dialysis today.  We will plan for dialysis again tomorrow.  2. Ascites:  Patient had recent peritonitis and was treated with ciprofloxacin.  He will be going for paracentesis today.  3. Anemia of chronic kidney disease:  Hemoglobin down to 7.5.  Continue to monitor.  We will start the patient on Epogen 10,000 units with dialysis.  4.  Secondary hyperparathyroidism.  Check phosphorus with dialysis tomorrow.  5.  Pleural effusion.  Patient status post right thoracentesis 01/25/17.  Breathing has improved.   LOS: 1 Shaylynne Lunt 4/27/20181:02 PM

## 2017-01-26 NOTE — Progress Notes (Addendum)
MD is aware of low BP at 85/52. MD ordered to give BP medications as long as pt is asymptomatic. Pt has no c/o dizziness or light headedness.

## 2017-01-26 NOTE — Progress Notes (Signed)
Per night shift RN and pt, pt accidentally pulled his foley with his foot and requested for RN to completely remove foley.

## 2017-01-27 LAB — PHOSPHORUS: Phosphorus: 3.9 mg/dL (ref 2.5–4.6)

## 2017-01-27 LAB — GLUCOSE, CAPILLARY: GLUCOSE-CAPILLARY: 89 mg/dL (ref 65–99)

## 2017-01-27 LAB — PARATHYROID HORMONE, INTACT (NO CA): PTH: 46 pg/mL (ref 15–65)

## 2017-01-27 MED ORDER — EPOETIN ALFA 10000 UNIT/ML IJ SOLN
10000.0000 [IU] | INTRAMUSCULAR | Status: DC
  Administered 2017-01-27: 10000 [IU] via INTRAVENOUS

## 2017-01-27 NOTE — Progress Notes (Signed)
[facility]  ROUNDING NOTE   Subjective:  Patient seen at bedside. States he's feeling much better after paracentesis. 4.2 L of ascites was removed. Patient due for hemodialysis today.  Objective:  Vital signs in last 24 hours:  Temp:  [99.8 F (37.7 C)-100.2 F (37.9 C)] 99.8 F (37.7 C) (04/27 2007) Pulse Rate:  [77-80] 77 (04/28 0500) Resp:  [16-19] 19 (04/28 0500) BP: (81-108)/(44-67) 108/67 (04/28 0500) SpO2:  [96 %-99 %] 97 % (04/28 0500)  Weight change:  Filed Weights   01/25/17 0852 01/25/17 1705 01/25/17 2015  Weight: 80.7 kg (178 lb) 86 kg (189 lb 9.5 oz) 84.5 kg (186 lb 4.6 oz)    Intake/Output: I/O last 3 completed shifts: In: 720 [P.O.:720] Out: 1980 [Urine:480; Other:1500]   Intake/Output this shift:  Total I/O In: 0  Out: 100 [Urine:100]  Physical Exam: General: No acute distress  Head: Normocephalic, atraumatic. Moist oral mucosal membranes  Eyes: Anicteric  Neck: Supple, trachea midline  Lungs:  diminised at bases, normal effort  Heart: S1S2 no rubs  Abdomen:  Soft, decreased distension, non tender  Extremities: trace peripheral edema.  Neurologic: Awake, alert, following commands  Skin: No lesions  Access: L IJ permcath    Basic Metabolic Panel:  Recent Labs Lab 01/25/17 0854 01/25/17 1930 01/26/17 0430  NA 138  --  138  K 4.3  --  4.0  CL 98*  --  103  CO2 29  --  30  GLUCOSE 121*  --  147*  BUN 39*  --  28*  CREATININE 4.27*  --  3.03*  CALCIUM 8.7*  --  7.7*  PHOS  --  2.2*  --     Liver Function Tests: No results for input(s): AST, ALT, ALKPHOS, BILITOT, PROT, ALBUMIN in the last 168 hours. No results for input(s): LIPASE, AMYLASE in the last 168 hours. No results for input(s): AMMONIA in the last 168 hours.  CBC:  Recent Labs Lab 01/25/17 0854 01/26/17 0430  WBC 7.3 6.8  HGB 8.2* 7.5*  HCT 26.5* 23.9*  MCV 80.2 78.5*  PLT 244 209    Cardiac Enzymes: No results for input(s): CKTOTAL, CKMB,  CKMBINDEX, TROPONINI in the last 168 hours.  BNP: Invalid input(s): POCBNP  CBG:  Recent Labs Lab 01/26/17 0737 01/26/17 1127 01/26/17 1644 01/26/17 2244 01/27/17 0735  GLUCAP 124* 174* 121* 112* 89    Microbiology: Results for orders placed or performed during the hospital encounter of 01/25/17  Body fluid culture     Status: None (Preliminary result)   Collection Time: 01/25/17  3:20 PM  Result Value Ref Range Status   Specimen Description PLEURAL  Final   Special Requests NONE  Final   Gram Stain   Final    RARE WBC PRESENT, PREDOMINANTLY MONONUCLEAR NO ORGANISMS SEEN    Culture   Final    NO GROWTH < 24 HOURS Performed at [facility], [address], [place], [place] [address]    Report Status PENDING  Incomplete    Coagulation Studies: No results for input(s): LABPROT, INR in the last 72 hours.  Urinalysis:  Recent Labs  01/25/17 0854  COLORURINE AMBER*  LABSPEC 1.012  PHURINE 5.0  GLUCOSEU NEGATIVE  HGBUR SMALL*  BILIRUBINUR NEGATIVE  KETONESUR NEGATIVE  PROTEINUR 30*  NITRITE NEGATIVE  LEUKOCYTESUR NEGATIVE      Imaging: Dg Chest 1 View  Result Date: 01/25/2017 CLINICAL DATA:  Status post right thoracentesis today. EXAM: CHEST 1 VIEW  COMPARISON:  PA and lateral chest 01/25/2017. FINDINGS: Large right pleural effusion is somewhat decreased after thoracentesis. No pneumothorax. Right basilar airspace disease is unchanged. Left lung demonstrates a small focus of linear atelectasis or scar, unchanged. Cardiac silhouette is largely obscured. Dialysis catheter noted. IMPRESSION: Some decrease in a right pleural effusion after thoracentesis. Negative for pneumothorax or other new abnormality. Electronically Signed   By: Drusilla Kanner M.D.   On: 01/25/2017 16:01   Dg Chest 2 View  Result Date: 01/25/2017 CLINICAL DATA:  Dizziness and shortness of breath beginning yesterday. EXAM: CHEST  2 VIEW COMPARISON:  01/19/2017 FINDINGS: Central line is  unchanged. Tips are in the SVC just above the right atrium. Large right effusion persists with subtotal collapse of the right lung. This may be even slightly larger. The left chest remains clear. No pleural fluid on that side. IMPRESSION: Large right effusion with subtotal collapse of the right lung. This is probably even slightly larger than was seen 6 days ago. Electronically Signed   By: Paulina Fusi M.D.   On: 01/25/2017 10:32   US Paracentesis  Result Date: 01/26/2017 CLINICAL DATA:  End-stage renal disease, cardiomyopathy, recurrent large volume abdominal ascites EXAM: ULTRASOUND GUIDED PARACENTESIS TECHNIQUE: The procedure, risks (including but not limited to bleeding, infection, organ damage ), benefits, and alternatives were explained to the patient. Questions regarding the procedure were encouraged and answered. The patient understands and consents to the procedure. Survey ultrasound of the abdomen was performed and an appropriate skin entry site in the abdomen was selected. Skin site was marked, prepped with chlorhexidine, and draped in usual sterile fashion, and infiltrated locally with 1% lidocaine. A Safe-T-Centesis needle was advanced into the peritoneal space until fluid could be aspirated. The sheath was advanced and the needle removed. 4.2 L of amber clearascites were aspirated. Postprocedure scan shows no significant residual fluid. The patient tolerated the procedure well. COMPLICATIONS: COMPLICATIONS none IMPRESSION: Technically successful ultrasound guided paracentesis, removing 4.2 L of ascites. Electronically Signed   By: Corlis Leak M.D.   On: 01/26/2017 15:09   US Thoracentesis Asp Pleural Space W/img Guide  Result Date: 01/25/2017 INDICATION: End-stage renal disease, cardiomyopathy, dyspnea, right pleural effusion; request made for diagnostic and therapeutic right thoracentesis. EXAM: ULTRASOUND GUIDED DIAGNOSTIC AND THERAPEUTIC RIGHT THORACENTESIS MEDICATIONS: None. COMPLICATIONS:  None immediate. PROCEDURE: An ultrasound guided thoracentesis was thoroughly discussed with the patient and questions answered. The benefits, risks, alternatives and complications were also discussed. The patient understands and wishes to proceed with the procedure. Written consent was obtained. Ultrasound was performed to localize and mark an adequate pocket of fluid in the right chest. The area was then prepped and draped in the normal sterile fashion. 1% Lidocaine was used for local anesthesia. Under ultrasound guidance a Safe-T-Centesis catheter was introduced. Thoracentesis was performed. The catheter was removed and a dressing applied. FINDINGS: A total of approximately 2 liters of yellow fluid was removed. Samples were sent to the laboratory as requested by the clinical team. IMPRESSION: Successful ultrasound guided diagnostic and therapeutic right thoracentesis yielding 2 liters of pleural fluid. Read by: Jeananne Rama, PA-C Electronically Signed   By: Simonne Come M.D.   On: 01/25/2017 15:42     Medications:    . amiodarone  200 mg Oral Daily  . aspirin EC  81 mg Oral Daily  . carvedilol  3.125 mg Oral BID WC  . ciprofloxacin  500 mg Oral q1800  . fluticasone furoate-vilanterol  1 puff Inhalation Daily  . furosemide  80 mg Oral BID  . heparin  5,000 Units Subcutaneous Q8H  . insulin aspart  0-5 Units Subcutaneous QHS  . insulin aspart  0-9 Units Subcutaneous TID WC  . insulin aspart  4 Units Subcutaneous TID WC  . lisinopril  5 mg Oral Daily  . methimazole  10 mg Oral Daily  . multivitamin  1 tablet Oral QHS  . pantoprazole  40 mg Oral Daily  . protein supplement shake  11 oz Oral BID BM  . spironolactone  25 mg Oral Daily   acetaminophen **OR** acetaminophen, alteplase, bisacodyl, heparin, lidocaine (PF), lidocaine-prilocaine, ondansetron **OR** ondansetron (ZOFRAN) IV, pentafluoroprop-tetrafluoroeth, traMADol  Assessment/ Plan:  46 y.o. male with hypertension, diabetes mellitus  type 2, chronic kidney disease stage III, peripheral neuropathy  [facility]S/[name]   1. ESRD on HD TTHS:  Patient due for hemodialysis today. Orders 7 prepared.  2. Ascites:  Patient status post paracentesis. 4.2 L removed yesterday.  3. Anemia of chronic kidney disease: Start epogen 10000 units IV with HD today.  4.  Secondary hyperparathyroidism.  Check phosphorus with dialysis today.  5.  Pleural effusion.  Patient status post right thoracentesis 01/25/17.    LOS: 2 Juan Roberson 4/28/20188:38 AM

## 2017-01-27 NOTE — Progress Notes (Signed)
Post hd assessment 

## 2017-01-27 NOTE — Progress Notes (Signed)
Pre hd assessment  

## 2017-01-27 NOTE — Progress Notes (Signed)
  End of hd 

## 2017-01-27 NOTE — Discharge Instructions (Signed)
Resume usual hemodialysis Tuesday Thursday Saturday as before

## 2017-01-27 NOTE — Discharge Summary (Signed)
[facility] - [place] [facility]   PATIENT NAME: Juan Roberson    MR#:  [id number]  DATE OF BIRTH:  05/06/1971  DATE OF ADMISSION:  01/25/2017 ADMITTING PHYSICIAN: Juan Siren, MD  DATE OF DISCHARGE: 01/27/2017  PRIMARY CARE PHYSICIAN: MASOUD,JAVED, MD    ADMISSION DIAGNOSIS:  Shortness of breath [R06.02] Urinary retention [R33.9] Anuria [R34] Pleural effusion [J90]  DISCHARGE DIAGNOSIS:  Acute on chronic respiratory failure secondary to CHF and right pleural effusion status post thoracentesis with removal of 2 L of transudative fluid Recurrent ascites status post paracentesis removal of 4 L End-stage renal disease on hemodialysis Cardiomyopathy EF of 20%  SECONDARY DIAGNOSIS:   Past Medical History:  Diagnosis Date  . Cellulitis and abscess of foot    Left-Dr. Ether Roberson  . CHF (congestive heart failure) (HCC)    EF 20%  . CKD (chronic kidney disease), stage III   . Diabetes mellitus without complication (HCC)    a. Dx ~ 1996.  [name] Dysrhythmia   . Essential hypertension   . Gastritis    a. 04/2015 hematemesis -> EGD: gastritis, esophagitis, duodenitis.  No active bleeding.  PPI added.  [name] GERD (gastroesophageal reflux disease)   . Left leg DVT (HCC)    a. Dx 05/2015 -> Coumadin.  . MI (myocardial infarction) (HCC)   . Osteomyelitis (HCC)    a. 05/2015 L foot.    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  1. Right-sided pleural effusion-this is a cause of patient's shortness of breath.  -Status post thoracentesis with removal of 1.2 L of fluid. Sats are more than 92% on room air. Patient feels a lot better. -Appears transudative fluid likely secondary to acute on chronic  systolic congestive heart failure -Echo done in April 2018 shows EF of 30%  2. End-stage renal disease on hemodialysis-patient is on a Tuesday Thursday Saturday schedule. -Nephrology consultation for in-house dialysis  3. History of ascites with SBP-patient is currently complaining of abdominal pain. He had a recent paracentesis and the fluid was consistent with SBP. - will continue ciprofloxacin, he is currently afebrile  - s/p  paracentesis therapeutic --removed about 4 liters  4. Severe ischemic cardiomyopathy with ejection fraction of 30% -continue carvedilol, lisinopril, Aldactone, Lasix.  5. GERD-continue Protonix.  6. COPD-no acute exacerbation-continue Breo Ellipta  7. Anemia of chronic disease, multifactorial No active bleeding -received Epogen at dialysis discussed with Dr. Cherylann Roberson Overall doing well. We'll discharge home after dialysis today. CONSULTS OBTAINED:  Treatment Team:  Juan Haagensen, MD  DRUG ALLERGIES:   Allergies  Allergen Reactions  . Sulfur Other (See Comments)    Pt states that this medication causes renal failure.      DISCHARGE MEDICATIONS:   Current Discharge Medication List    CONTINUE these medications which have NOT CHANGED   Details  amiodarone (PACERONE) 200 MG tablet Take 1 tablet (200 mg total) by mouth daily. Qty: 30 tablet, Refills: 2    aspirin EC 81 MG EC tablet Take 1 tablet (81 mg total) by mouth daily. Qty: 30 tablet, Refills: 0    bisacodyl (DULCOLAX) 10 MG suppository Place 1 suppository (10 mg total) rectally daily as needed for moderate constipation. Qty: 12 suppository, Refills: 0    BREO ELLIPTA 100-25 MCG/INH  AEPB Inhale 1 puff into the lungs daily.  Refills: 3    carvedilol (COREG) 3.125 MG tablet Take 1 tablet (3.125 mg total) by mouth 2 (two) times daily with a meal. Qty: 60 tablet, Refills: 0    ciprofloxacin (CIPRO) 500 MG tablet Take 1 tablet (500 mg total) by mouth daily. Qty: 16 tablet, Refills: 0     furosemide (LASIX) 80 MG tablet Take 1 tablet (80 mg total) by mouth 2 (two) times daily. Qty: 60 tablet, Refills: 2    insulin aspart (NOVOLOG) 100 UNIT/ML injection Inject 4 Units into the skin 3 (three) times daily with meals. Qty: 10 mL, Refills: 11    lisinopril (PRINIVIL,ZESTRIL) 5 MG tablet Take 1 tablet (5 mg total) by mouth daily. Qty: 30 tablet, Refills: 1    methimazole (TAPAZOLE) 10 MG tablet Take 1 tablet (10 mg total) by mouth daily. Qty: 30 tablet, Refills: 0    multivitamin (RENA-VIT) TABS tablet Take 1 tablet by mouth at bedtime. Qty: 30 tablet, Refills: 0    pantoprazole (PROTONIX) 40 MG tablet Take 1 tablet (40 mg total) by mouth daily. Qty: 30 tablet, Refills: 0    spironolactone (ALDACTONE) 25 MG tablet Take 1 tablet (25 mg total) by mouth daily. Qty: 30 tablet, Refills: 1    traMADol (ULTRAM) 50 MG tablet Take 50 mg by mouth daily as needed.  Refills: 2    Nutritional Supplements (FEEDING SUPPLEMENT, NEPRO CARB STEADY,) LIQD Take 237 mLs by mouth 2 (two) times daily between meals. Qty: 20 Can, Refills: 0    zolpidem (AMBIEN) 5 MG tablet Take 5 mg by mouth at bedtime as needed for sleep.  Refills: 0        If you experience worsening of your admission symptoms, develop shortness of breath, life threatening emergency, suicidal or homicidal thoughts you must seek medical attention immediately by calling 911 or calling your MD immediately  if symptoms less severe.  You Must read complete instructions/literature along with all the possible adverse reactions/side effects for all the Medicines you take and that have been prescribed to you. Take any new Medicines after you have completely understood and accept all the possible adverse reactions/side effects.   Please note  You were cared for by a hospitalist during your hospital stay. If you have any questions about your discharge medications or the care you received while you were in the hospital after you  are discharged, you can call the unit and asked to speak with the hospitalist on call if the hospitalist that took care of you is not available. Once you are discharged, your primary care physician will handle any further medical issues. Please note that NO REFILLS for any discharge medications will be authorized once you are discharged, as it is imperative that you return to your primary care physician (or establish a relationship with a primary care physician if you do not have one) for your aftercare needs so that they can reassess your need for medications and monitor your lab values. Today   SUBJECTIVE   Doing well  VITAL SIGNS:  Blood pressure 108/66, pulse 82, temperature 98.4 F (36.9 C), temperature source Oral, resp. rate 16, height  (1.88 m), weight 81 kg (178 lb 9.2 oz), SpO2 98 %.  I/O:   Intake/Output Summary (Last 24 hours) at 01/27/17 1104 Last data filed at 01/27/17 0937  Gross per 24 hour  Intake              480  ml  Output              230 ml  Net              250 ml    PHYSICAL EXAMINATION:  GENERAL:  46 y.o.-year-old patient lying in the bed with no acute distress.  EYES: Pupils equal, round, reactive to light and accommodation. No scleral icterus. Extraocular muscles intact.  HEENT: Head atraumatic, normocephalic. Oropharynx and nasopharynx clear.  NECK:  Supple, no jugular venous distention. No thyroid enlargement, no tenderness.  LUNGS: distant breath sounds bilaterally, no wheezing, rales,rhonchi or crepitation. No use of accessory muscles of respiration.  CARDIOVASCULAR: S1, S2 normal. No murmurs, rubs, or gallops.  ABDOMEN: Soft, non-tender, non-distended. Bowel sounds present. No organomegaly or mass.  EXTREMITIES: No pedal edema, cyanosis, or clubbing.  NEUROLOGIC: Cranial nerves II through XII are intact. Muscle strength 5/5 in all extremities. Sensation intact. Gait not checked.  PSYCHIATRIC: The patient is alert and oriented x 3.  SKIN: No obvious  rash, lesion, or ulcer. Tattoo++  DATA REVIEW:   CBC   Recent Labs Lab 01/26/17 0430  WBC 6.8  HGB 7.5*  HCT 23.9*  PLT 209    Chemistries   Recent Labs Lab 01/26/17 0430  NA 138  K 4.0  CL 103  CO2 30  GLUCOSE 147*  BUN 28*  CREATININE 3.03*  CALCIUM 7.7*    Microbiology Results   Recent Results (from the past 240 hour(s))  Body fluid culture     Status: None (Preliminary result)   Collection Time: 01/25/17  3:20 PM  Result Value Ref Range Status   Specimen Description PLEURAL  Final   Special Requests NONE  Final   Gram Stain   Final    RARE WBC PRESENT, PREDOMINANTLY MONONUCLEAR NO ORGANISMS SEEN    Culture   Final    NO GROWTH 2 DAYS Performed at [facility], [address], [place], [place] [address]    Report Status PENDING  Incomplete    RADIOLOGY:  Dg Chest 1 View  Result Date: 01/25/2017 CLINICAL DATA:  Status post right thoracentesis today. EXAM: CHEST 1 VIEW COMPARISON:  PA and lateral chest 01/25/2017. FINDINGS: Large right pleural effusion is somewhat decreased after thoracentesis. No pneumothorax. Right basilar airspace disease is unchanged. Left lung demonstrates a small focus of linear atelectasis or scar, unchanged. Cardiac silhouette is largely obscured. Dialysis catheter noted. IMPRESSION: Some decrease in a right pleural effusion after thoracentesis. Negative for pneumothorax or other new abnormality. Electronically Signed   By: Drusilla Kanner M.D.   On: 01/25/2017 16:01   US Paracentesis  Result Date: 01/26/2017 CLINICAL DATA:  End-stage renal disease, cardiomyopathy, recurrent large volume abdominal ascites EXAM: ULTRASOUND GUIDED PARACENTESIS TECHNIQUE: The procedure, risks (including but not limited to bleeding, infection, organ damage ), benefits, and alternatives were explained to the patient. Questions regarding the procedure were encouraged and answered. The patient understands and consents to the procedure. Survey  ultrasound of the abdomen was performed and an appropriate skin entry site in the abdomen was selected. Skin site was marked, prepped with chlorhexidine, and draped in usual sterile fashion, and infiltrated locally with 1% lidocaine. A Safe-T-Centesis needle was advanced into the peritoneal space until fluid could be aspirated. The sheath was advanced and the needle removed. 4.2 L of amber clearascites were aspirated. Postprocedure scan shows no significant residual fluid. The patient tolerated the procedure well. COMPLICATIONS: COMPLICATIONS none IMPRESSION: Technically successful ultrasound guided paracentesis, removing  4.2 L of ascites. Electronically Signed   By: Corlis Leak M.D.   On: 01/26/2017 15:09   US Thoracentesis Asp Pleural Space W/img Guide  Result Date: 01/25/2017 INDICATION: End-stage renal disease, cardiomyopathy, dyspnea, right pleural effusion; request made for diagnostic and therapeutic right thoracentesis. EXAM: ULTRASOUND GUIDED DIAGNOSTIC AND THERAPEUTIC RIGHT THORACENTESIS MEDICATIONS: None. COMPLICATIONS: None immediate. PROCEDURE: An ultrasound guided thoracentesis was thoroughly discussed with the patient and questions answered. The benefits, risks, alternatives and complications were also discussed. The patient understands and wishes to proceed with the procedure. Written consent was obtained. Ultrasound was performed to localize and mark an adequate pocket of fluid in the right chest. The area was then prepped and draped in the normal sterile fashion. 1% Lidocaine was used for local anesthesia. Under ultrasound guidance a Safe-T-Centesis catheter was introduced. Thoracentesis was performed. The catheter was removed and a dressing applied. FINDINGS: A total of approximately 2 liters of yellow fluid was removed. Samples were sent to the laboratory as requested by the clinical team. IMPRESSION: Successful ultrasound guided diagnostic and therapeutic right thoracentesis yielding 2 liters  of pleural fluid. Read by: Jeananne Rama, PA-C Electronically Signed   By: Simonne Come M.D.   On: 01/25/2017 15:42     Management plans discussed with the patient, family and they are in agreement.  CODE STATUS:     Code Status Orders        Start     Ordered   01/25/17 1309  Full code  Continuous     01/25/17 1308    Code Status History    Date Active Date Inactive Code Status Order ID Comments User Context   01/14/2017  6:18 AM 01/19/2017  9:31 PM Full Code [id number]  Ihor Austin, MD Inpatient   12/05/2016  8:01 PM 12/08/2016  3:11 PM Full Code [id number]  Altamese Dilling, MD Inpatient   10/28/2016  8:42 PM 10/31/2016  8:35 PM Full Code [id number]  Juan Siren, MD Inpatient   10/17/2016  8:18 PM 10/27/2016  6:48 PM Full Code [id number]  Auburn Bilberry, MD Inpatient   09/24/2016  3:36 PM 09/25/2016  2:15 PM Full Code [id number]  Wyatt Haste, MD ED   07/21/2016 11:05 AM 07/22/2016  3:11 PM Full Code [id number]  Alford Highland, MD ED   02/28/2016  1:43 AM 03/01/2016  6:06 PM Full Code [id number]  Arnaldo Natal, MD Inpatient   12/06/2015  8:39 PM 12/08/2015  2:51 PM Full Code [id number]  Altamese Dilling, MD Inpatient   11/01/2015  4:00 PM 11/03/2015  2:28 PM Full Code [id number]  Wyatt Haste, MD ED   06/29/2015  2:30 AM 07/01/2015  2:21 PM Full Code [id number]  Arnaldo Natal, MD Inpatient   05/21/2015  4:34 PM 05/25/2015  4:34 PM Full Code [id number]  Linus Galas, MD Inpatient   05/18/2015  9:17 PM 05/21/2015  4:34 PM Full Code [id number]  Shaune Pollack, MD Inpatient   04/05/2015  7:05 AM 04/08/2015  2:00 PM Full Code [id number]  Arnaldo Natal, MD Inpatient      TOTAL TIME TAKING CARE OF THIS PATIENT: *40* minutes.    Candy Ziegler M.D on 01/27/2017 at 11:04 AM  Between 7am to 6pm - Pager - [phone] After 6pm go to [email or web address] - Social research officer, government  [facility]  Office  [phone]  CC: Primary care physician; Corky Downs, MD

## 2017-01-27 NOTE — Progress Notes (Signed)
Patient discharged to home. IV site removed. Concerns addressed.

## 2017-01-27 NOTE — Progress Notes (Signed)
Pre hd info 

## 2017-01-27 NOTE — Progress Notes (Signed)
Start of hd 

## 2017-01-28 LAB — PH, BODY FLUID: pH, Body Fluid: 7.6

## 2017-01-29 LAB — BODY FLUID CULTURE: Culture: NO GROWTH

## 2017-01-30 ENCOUNTER — Telehealth: Payer: Self-pay | Admitting: Family

## 2017-01-30 ENCOUNTER — Ambulatory Visit: Payer: BLUE CROSS/BLUE SHIELD | Admitting: Family

## 2017-01-30 NOTE — Telephone Encounter (Signed)
Patient did not show for his [facility] appointment on 01/30/17. Will attempt to reschedule.

## 2017-02-02 ENCOUNTER — Ambulatory Visit (INDEPENDENT_AMBULATORY_CARE_PROVIDER_SITE_OTHER): Payer: BLUE CROSS/BLUE SHIELD | Admitting: Vascular Surgery

## 2017-02-06 ENCOUNTER — Emergency Department: Payer: BLUE CROSS/BLUE SHIELD

## 2017-02-06 ENCOUNTER — Inpatient Hospital Stay: Payer: BLUE CROSS/BLUE SHIELD

## 2017-02-06 ENCOUNTER — Inpatient Hospital Stay
Admission: EM | Admit: 2017-02-06 | Discharge: 2017-02-09 | DRG: 371 | Disposition: A | Discharge status: Home / Self Care | Payer: BLUE CROSS/BLUE SHIELD | Attending: Specialist | Admitting: Specialist

## 2017-02-06 DIAGNOSIS — R188 Other ascites: Secondary | ICD-10-CM | POA: Diagnosis present

## 2017-02-06 DIAGNOSIS — K652 Spontaneous bacterial peritonitis: Principal | ICD-10-CM | POA: Diagnosis present

## 2017-02-06 DIAGNOSIS — E1142 Type 2 diabetes mellitus with diabetic polyneuropathy: Secondary | ICD-10-CM | POA: Diagnosis present

## 2017-02-06 DIAGNOSIS — I255 Ischemic cardiomyopathy: Secondary | ICD-10-CM | POA: Diagnosis present

## 2017-02-06 DIAGNOSIS — I132 Hypertensive heart and chronic kidney disease with heart failure and with stage 5 chronic kidney disease, or end stage renal disease: Secondary | ICD-10-CM | POA: Diagnosis present

## 2017-02-06 DIAGNOSIS — I959 Hypotension, unspecified: Secondary | ICD-10-CM | POA: Diagnosis not present

## 2017-02-06 DIAGNOSIS — Z86718 Personal history of other venous thrombosis and embolism: Secondary | ICD-10-CM

## 2017-02-06 DIAGNOSIS — R0602 Shortness of breath: Secondary | ICD-10-CM | POA: Diagnosis present

## 2017-02-06 DIAGNOSIS — J90 Pleural effusion, not elsewhere classified: Secondary | ICD-10-CM

## 2017-02-06 DIAGNOSIS — K659 Peritonitis, unspecified: Secondary | ICD-10-CM

## 2017-02-06 DIAGNOSIS — N2581 Secondary hyperparathyroidism of renal origin: Secondary | ICD-10-CM | POA: Diagnosis present

## 2017-02-06 DIAGNOSIS — Z8614 Personal history of Methicillin resistant Staphylococcus aureus infection: Secondary | ICD-10-CM | POA: Diagnosis not present

## 2017-02-06 DIAGNOSIS — K761 Chronic passive congestion of liver: Secondary | ICD-10-CM | POA: Diagnosis present

## 2017-02-06 DIAGNOSIS — E0590 Thyrotoxicosis, unspecified without thyrotoxic crisis or storm: Secondary | ICD-10-CM | POA: Diagnosis present

## 2017-02-06 DIAGNOSIS — Z7982 Long term (current) use of aspirin: Secondary | ICD-10-CM

## 2017-02-06 DIAGNOSIS — Z992 Dependence on renal dialysis: Secondary | ICD-10-CM | POA: Diagnosis not present

## 2017-02-06 DIAGNOSIS — N186 End stage renal disease: Secondary | ICD-10-CM | POA: Diagnosis present

## 2017-02-06 DIAGNOSIS — K219 Gastro-esophageal reflux disease without esophagitis: Secondary | ICD-10-CM | POA: Diagnosis present

## 2017-02-06 DIAGNOSIS — Z794 Long term (current) use of insulin: Secondary | ICD-10-CM | POA: Diagnosis not present

## 2017-02-06 DIAGNOSIS — E8770 Fluid overload, unspecified: Secondary | ICD-10-CM

## 2017-02-06 DIAGNOSIS — D631 Anemia in chronic kidney disease: Secondary | ICD-10-CM | POA: Diagnosis present

## 2017-02-06 DIAGNOSIS — I5022 Chronic systolic (congestive) heart failure: Secondary | ICD-10-CM | POA: Diagnosis present

## 2017-02-06 DIAGNOSIS — E1122 Type 2 diabetes mellitus with diabetic chronic kidney disease: Secondary | ICD-10-CM | POA: Diagnosis present

## 2017-02-06 DIAGNOSIS — Z882 Allergy status to sulfonamides status: Secondary | ICD-10-CM

## 2017-02-06 DIAGNOSIS — R7881 Bacteremia: Secondary | ICD-10-CM | POA: Diagnosis present

## 2017-02-06 DIAGNOSIS — Z79899 Other long term (current) drug therapy: Secondary | ICD-10-CM

## 2017-02-06 DIAGNOSIS — Z9889 Other specified postprocedural states: Secondary | ICD-10-CM

## 2017-02-06 DIAGNOSIS — M009 Pyogenic arthritis, unspecified: Secondary | ICD-10-CM | POA: Diagnosis present

## 2017-02-06 DIAGNOSIS — I252 Old myocardial infarction: Secondary | ICD-10-CM | POA: Diagnosis not present

## 2017-02-06 LAB — PATHOLOGIST SMEAR REVIEW

## 2017-02-06 LAB — GLUCOSE, CAPILLARY
GLUCOSE-CAPILLARY: 151 mg/dL — AB (ref 65–99)
Glucose-Capillary: 135 mg/dL — ABNORMAL HIGH (ref 65–99)

## 2017-02-06 LAB — PROTEIN, PLEURAL OR PERITONEAL FLUID: Total protein, fluid: 3.1 g/dL

## 2017-02-06 LAB — BODY FLUID CELL COUNT WITH DIFFERENTIAL
EOS FL: 0 %
LYMPHS FL: 8 %
Monocyte-Macrophage-Serous Fluid: 53 %
NEUTROPHIL FLUID: 39 %
WBC FLUID: 503 uL

## 2017-02-06 LAB — CBC WITH DIFFERENTIAL/PLATELET
Basophils Absolute: 0.1 10*3/uL (ref 0–0.1)
Basophils Relative: 1 %
EOS ABS: 0.1 10*3/uL (ref 0–0.7)
EOS PCT: 1 %
HCT: 27.9 % — ABNORMAL LOW (ref 40.0–52.0)
Hemoglobin: 8.7 g/dL — ABNORMAL LOW (ref 13.0–18.0)
LYMPHS ABS: 1 10*3/uL (ref 1.0–3.6)
Lymphocytes Relative: 17 %
MCH: 24.5 pg — AB (ref 26.0–34.0)
MCHC: 31.1 g/dL — AB (ref 32.0–36.0)
MCV: 78.8 fL — ABNORMAL LOW (ref 80.0–100.0)
MONO ABS: 0.5 10*3/uL (ref 0.2–1.0)
MONOS PCT: 9 %
Neutro Abs: 4 10*3/uL (ref 1.4–6.5)
Neutrophils Relative %: 72 %
PLATELETS: 270 10*3/uL (ref 150–440)
RBC: 3.54 MIL/uL — AB (ref 4.40–5.90)
RDW: 19.1 % — AB (ref 11.5–14.5)
WBC: 5.6 10*3/uL (ref 3.8–10.6)

## 2017-02-06 LAB — BASIC METABOLIC PANEL
Anion gap: 9 (ref 5–15)
BUN: 51 mg/dL — AB (ref 6–20)
CO2: 30 mmol/L (ref 22–32)
CREATININE: 5.78 mg/dL — AB (ref 0.61–1.24)
Calcium: 7.9 mg/dL — ABNORMAL LOW (ref 8.9–10.3)
Chloride: 99 mmol/L — ABNORMAL LOW (ref 101–111)
GFR calc Af Amer: 12 mL/min — ABNORMAL LOW (ref >60)
GFR, EST NON AFRICAN AMERICAN: 11 mL/min — AB (ref >60)
Glucose, Bld: 128 mg/dL — ABNORMAL HIGH (ref 65–99)
Potassium: 4.3 mmol/L (ref 3.5–5.1)
SODIUM: 138 mmol/L (ref 135–145)

## 2017-02-06 LAB — LACTATE DEHYDROGENASE, PLEURAL OR PERITONEAL FLUID: LD, Fluid: 71 U/L — ABNORMAL HIGH (ref 3–23)

## 2017-02-06 LAB — TROPONIN I: TROPONIN I: 0.03 ng/mL — AB (ref <0.03)

## 2017-02-06 MED ORDER — HEPARIN SODIUM (PORCINE) 5000 UNIT/ML IJ SOLN
5000.0000 [IU] | Freq: Three times a day (TID) | INTRAMUSCULAR | Status: DC
  Administered 2017-02-06 – 2017-02-09 (×7): 5000 [IU] via SUBCUTANEOUS
  Filled 2017-02-06 (×7): qty 1

## 2017-02-06 MED ORDER — CARVEDILOL 6.25 MG PO TABS
3.1250 mg | ORAL_TABLET | Freq: Two times a day (BID) | ORAL | Status: DC
  Administered 2017-02-06: 3.125 mg via ORAL
  Filled 2017-02-06 (×2): qty 1

## 2017-02-06 MED ORDER — MORPHINE SULFATE (PF) 4 MG/ML IV SOLN
4.0000 mg | Freq: Once | INTRAVENOUS | Status: AC
  Administered 2017-02-06: 4 mg via INTRAVENOUS
  Filled 2017-02-06: qty 1

## 2017-02-06 MED ORDER — INSULIN ASPART 100 UNIT/ML ~~LOC~~ SOLN
4.0000 [IU] | Freq: Three times a day (TID) | SUBCUTANEOUS | Status: DC
  Administered 2017-02-07 – 2017-02-09 (×6): 4 [IU] via SUBCUTANEOUS
  Filled 2017-02-06 (×6): qty 4

## 2017-02-06 MED ORDER — RENA-VITE PO TABS
1.0000 | ORAL_TABLET | Freq: Every day | ORAL | Status: DC
  Administered 2017-02-06 – 2017-02-08 (×3): 1 via ORAL
  Filled 2017-02-06 (×3): qty 1

## 2017-02-06 MED ORDER — TRAMADOL HCL 50 MG PO TABS
50.0000 mg | ORAL_TABLET | Freq: Every day | ORAL | Status: DC | PRN
  Administered 2017-02-06: 50 mg via ORAL
  Filled 2017-02-06: qty 1

## 2017-02-06 MED ORDER — ONDANSETRON HCL 4 MG/2ML IJ SOLN
4.0000 mg | Freq: Once | INTRAMUSCULAR | Status: AC
  Administered 2017-02-06: 4 mg via INTRAVENOUS
  Filled 2017-02-06: qty 2

## 2017-02-06 MED ORDER — IOPAMIDOL (ISOVUE-300) INJECTION 61%
15.0000 mL | INTRAVENOUS | Status: AC
  Administered 2017-02-06 (×2): 15 mL via ORAL

## 2017-02-06 MED ORDER — ACETAMINOPHEN 325 MG PO TABS
650.0000 mg | ORAL_TABLET | Freq: Four times a day (QID) | ORAL | Status: DC | PRN
  Administered 2017-02-08: 650 mg via ORAL
  Filled 2017-02-06: qty 2

## 2017-02-06 MED ORDER — FLUTICASONE FUROATE-VILANTEROL 100-25 MCG/INH IN AEPB
1.0000 | INHALATION_SPRAY | Freq: Every day | RESPIRATORY_TRACT | Status: DC
  Administered 2017-02-06 – 2017-02-09 (×4): 1 via RESPIRATORY_TRACT
  Filled 2017-02-06 (×2): qty 28

## 2017-02-06 MED ORDER — SPIRONOLACTONE 25 MG PO TABS
25.0000 mg | ORAL_TABLET | Freq: Every day | ORAL | Status: DC
  Administered 2017-02-06 – 2017-02-07 (×2): 25 mg via ORAL
  Filled 2017-02-06 (×2): qty 1

## 2017-02-06 MED ORDER — TRAMADOL HCL 50 MG PO TABS
ORAL_TABLET | ORAL | Status: AC
  Administered 2017-02-06: 50 mg via ORAL
  Filled 2017-02-06: qty 1

## 2017-02-06 MED ORDER — MORPHINE SULFATE (PF) 2 MG/ML IV SOLN
2.0000 mg | Freq: Once | INTRAVENOUS | Status: AC
  Administered 2017-02-06: 2 mg via INTRAVENOUS
  Filled 2017-02-06: qty 1

## 2017-02-06 MED ORDER — ONDANSETRON HCL 4 MG PO TABS: 4.0000 mg | ORAL_TABLET | Freq: Four times a day (QID) | ORAL | Status: DC | PRN

## 2017-02-06 MED ORDER — DEXTROSE 5 % IV SOLN
2.0000 g | INTRAVENOUS | Status: DC
  Administered 2017-02-06: 2 g via INTRAVENOUS
  Filled 2017-02-06 (×4): qty 2

## 2017-02-06 MED ORDER — PANTOPRAZOLE SODIUM 40 MG PO TBEC
40.0000 mg | DELAYED_RELEASE_TABLET | Freq: Every day | ORAL | Status: DC
  Administered 2017-02-06 – 2017-02-09 (×4): 40 mg via ORAL
  Filled 2017-02-06 (×4): qty 1

## 2017-02-06 MED ORDER — LISINOPRIL 5 MG PO TABS
5.0000 mg | ORAL_TABLET | Freq: Every day | ORAL | Status: DC
  Filled 2017-02-06: qty 1

## 2017-02-06 MED ORDER — ONDANSETRON HCL 4 MG/2ML IJ SOLN
4.0000 mg | Freq: Four times a day (QID) | INTRAMUSCULAR | Status: DC | PRN
  Administered 2017-02-06 – 2017-02-07 (×2): 4 mg via INTRAVENOUS
  Filled 2017-02-06 (×2): qty 2

## 2017-02-06 MED ORDER — ACETAMINOPHEN 650 MG RE SUPP: 650.0000 mg | Freq: Four times a day (QID) | RECTAL | Status: DC | PRN

## 2017-02-06 MED ORDER — AMIODARONE HCL 200 MG PO TABS
200.0000 mg | ORAL_TABLET | Freq: Every day | ORAL | Status: DC
  Administered 2017-02-06 – 2017-02-09 (×4): 200 mg via ORAL
  Filled 2017-02-06 (×4): qty 1

## 2017-02-06 MED ORDER — EPOETIN ALFA 10000 UNIT/ML IJ SOLN
4000.0000 [IU] | Freq: Once | INTRAMUSCULAR | Status: AC
  Administered 2017-02-07: 4000 [IU] via INTRAVENOUS

## 2017-02-06 MED ORDER — FUROSEMIDE 40 MG PO TABS
80.0000 mg | ORAL_TABLET | Freq: Two times a day (BID) | ORAL | Status: DC
  Administered 2017-02-06 – 2017-02-07 (×2): 80 mg via ORAL
  Filled 2017-02-06 (×2): qty 2

## 2017-02-06 MED ORDER — METHIMAZOLE 10 MG PO TABS
10.0000 mg | ORAL_TABLET | Freq: Every day | ORAL | Status: DC
  Administered 2017-02-06 – 2017-02-09 (×4): 10 mg via ORAL
  Filled 2017-02-06 (×4): qty 1

## 2017-02-06 MED ORDER — BISACODYL 10 MG RE SUPP: 10.0000 mg | Freq: Every day | RECTAL | Status: DC | PRN

## 2017-02-06 MED ORDER — ASPIRIN EC 81 MG PO TBEC
81.0000 mg | DELAYED_RELEASE_TABLET | Freq: Every day | ORAL | Status: DC
  Administered 2017-02-06 – 2017-02-09 (×4): 81 mg via ORAL
  Filled 2017-02-06 (×4): qty 1

## 2017-02-06 MED ORDER — ZOLPIDEM TARTRATE 5 MG PO TABS: 5.0000 mg | ORAL_TABLET | Freq: Every evening | ORAL | Status: DC | PRN

## 2017-02-06 NOTE — ED Notes (Signed)
Patient transported to X-ray 

## 2017-02-06 NOTE — ED Notes (Signed)
Patient transported to Ultrasound 

## 2017-02-06 NOTE — Consult Note (Signed)
[facility] Infectious Disease     Reason for Consult: MRSA septic joint and bacteremia    Referring Physician: Dolores Frame Date of Admission:  02/06/2017   Active Problems:   SBP (spontaneous bacterial peritonitis) ([place])   HPI: Juan Roberson. is a 46 y.o. male admitted with abdominal distention and SOB. 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 He has had several recent admissions 3/9 and 4/20 and 4/28. Recent paracentesis March 7- wbc 1236, 81 % PMNs culture neg - dc on cipro April 16 - 802 Wbc pmn 51%, culture neg - dc on cipro April 26 -wbc 138, 3% pmn culture neg - dc on cipro  May 8 503 wbc, 39% Pmns culture neg On this admit he had no fever, nml wbc of 5.6. He had paracentesis done with wbc 503 and 39% PMNs. He has some reports of abd discomfort, distention and pain.  Past Medical History:  Diagnosis Date  . Cellulitis and abscess of foot    Left-Dr. Vickki Muff  . CHF (congestive heart failure) (HCC)    EF 20%  . CKD (chronic kidney disease), stage III   . Diabetes mellitus without complication ([place])    a. Dx ~ 1996.  [name] Dysrhythmia   . Essential hypertension   . Gastritis    a. 04/2015 hematemesis -> EGD: gastritis, esophagitis, duodenitis.  No active bleeding.  PPI added.  [name] GERD (gastroesophageal reflux disease)   . Left leg DVT ([place])    a. Dx 05/2015 -> Coumadin.  . MI (myocardial infarction) ([place])   . Osteomyelitis ([place])    a. 05/2015 L foot.   Past Surgical History:  Procedure Laterality Date  . CARDIAC CATHETERIZATION Right 10/26/2016   Procedure: Left Heart Cath and Coronary Angiography;  Surgeon: Dionisio Hawke Villalpando, MD;  Location: [place] CV LAB;  Service: Cardiovascular;  Laterality: Right;  . DIALYSIS/PERMA CATHETER INSERTION N/A 01/17/2017   Procedure: Dialysis/Perma Catheter Insertion;  Surgeon: Algernon Huxley, MD;  Location: [place]  CV LAB;  Service: Cardiovascular;  Laterality: N/A;  . DIALYSIS/PERMA CATHETER REMOVAL N/A 12/05/2016   Procedure: Dialysis/Perma Catheter Removal;  Surgeon: Katha Cabal, MD;  Location: [place] CV LAB;  Service: Cardiovascular;  Laterality: N/A;  . ESOPHAGOGASTRODUODENOSCOPY N/A 04/07/2015   Procedure: ESOPHAGOGASTRODUODENOSCOPY (EGD);  Surgeon: Lucilla Lame, MD;  Location: [facility] ENDOSCOPY;  Service: Endoscopy;  Laterality: N/A;  . ESOPHAGOGASTRODUODENOSCOPY (EGD) WITH PROPOFOL Left 06/30/2015   Procedure: ESOPHAGOGASTRODUODENOSCOPY (EGD) WITH PROPOFOL;  Surgeon: Lollie Sails, MD;  Location: [facility] ENDOSCOPY;  Service: Endoscopy;  Laterality: Left;  . FOOT SURGERY    . I&D EXTREMITY Left 04/06/2015   Procedure: IRRIGATION AND DEBRIDEMENT EXTREMITY;  Surgeon: Samara Deist, DPM;  Location: [facility] ORS;  Service: Podiatry;  Laterality: Left;  . IRRIGATION AND DEBRIDEMENT FOOT Left 05/21/2015   Procedure: IRRIGATION AND DEBRIDEMENT FOOT;  Surgeon: Sharlotte Alamo, MD;  Location: [facility] ORS;  Service: Podiatry;  Laterality: Left;  . IRRIGATION AND DEBRIDEMENT KNEE Right 10/18/2016   Procedure: IRRIGATION AND DEBRIDEMENT KNEE;  Surgeon: Earnestine Leys, MD;  Location: [facility] ORS;  Service: Orthopedics;  Laterality: Right;  . KNEE ARTHROSCOPY  10/18/2016   Procedure: ARTHROSCOPY KNEE;  Surgeon: Earnestine Leys, MD;  Location: [facility] ORS;  Service: Orthopedics;;  . PERIPHERAL VASCULAR CATHETERIZATION N/A 10/27/2016   Procedure: Dialysis/Perma Catheter Insertion;  Surgeon: Algernon Huxley, MD;  Location: [place] CV LAB;  Service: Cardiovascular;  Laterality: N/A;   Social History  Substance Use Topics  . Smoking status: Never Smoker  . Smokeless tobacco: Never Used  . Alcohol use No   Family History  Problem Relation Age of Onset  . Coronary artery disease Father   . Pancreatic cancer Mother   . Breast cancer Sister   . Lung cancer Brother   . Cervical cancer Sister     Allergies:  Allergies  Allergen  Reactions  . Sulfur Other (See Comments)    Pt states that this medication causes renal failure.      Current antibiotics: Antibiotics Given (last 72 hours)    None      MEDICATIONS: . amiodarone  200 mg Oral Daily  . aspirin EC  81 mg Oral Daily  . carvedilol  3.125 mg Oral BID WC  . fluticasone furoate-vilanterol  1 puff Inhalation Daily  . furosemide  80 mg Oral BID  . heparin  5,000 Units Subcutaneous Q8H  . insulin aspart  4 Units Subcutaneous TID WC  . lisinopril  5 mg Oral Daily  . methimazole  10 mg Oral Daily  . multivitamin  1 tablet Oral QHS  . pantoprazole  40 mg Oral Daily  . spironolactone  25 mg Oral Daily    Review of Systems - 11 systems reviewed and negative per HPI   OBJECTIVE: Temp:  [97.7 F (36.5 C)-98.1 F (36.7 C)] 97.7 F (36.5 C) (05/08 1409) Pulse Rate:  [67-102] 102 (05/08 1409) Resp:  [16-22] 20 (05/08 1409) BP: (84-121)/(52-84) 101/73 (05/08 1409) SpO2:  [94 %-100 %] 98 % (05/08 1409) Weight:  [80.7 kg (178 lb)] 80.7 kg (178 lb) (05/08 0825) Physical Exam  Constitutional: He is oriented to person, place, and time. disheveled HENT: anicteric Mouth/Throat: Oropharynx is clear and dry. No oropharyngeal exudate.  Cardiovascular: Normal rate, regular rhythm -distant  Pulmonary/Chest:decreased bs bil Abdominal: Soft. Mod distention but not taut, mild diffuse ttp  Lymphadenopathy: He has no cervical adenopathy.  Ext 1+ edema HD cath R chest wall wnl Neurological: He is alert and oriented to person, place, and time.  Skin: Skin is warm and dry. No rash noted. No erythema.  Psychiatric: He has a normal mood and affect. His behavior is normal.   LABS: Results for orders placed or performed during the hospital encounter of 02/06/17 (from the past 48 hour(s))  CBC with Differential/Platelet     Status: Abnormal   Collection Time: 02/06/17  9:20 AM  Result Value Ref Range   WBC 5.6 3.8 - 10.6 K/uL   RBC 3.54 (L) 4.40 - 5.90 MIL/uL    Hemoglobin 8.7 (L) 13.0 - 18.0 g/dL   HCT 27.9 (L) 40.0 - 52.0 %   MCV 78.8 (L) 80.0 - 100.0 fL   MCH 24.5 (L) 26.0 - 34.0 pg   MCHC 31.1 (L) 32.0 - 36.0 g/dL   RDW 19.1 (H) 11.5 - 14.5 %   Platelets 270 150 - 440 K/uL   Neutrophils Relative % 72 %   Neutro Abs 4.0 1.4 - 6.5 K/uL   Lymphocytes Relative 17 %   Lymphs Abs 1.0 1.0 - 3.6 K/uL   Monocytes Relative 9 %   Monocytes Absolute 0.5 0.2 - 1.0 K/uL   Eosinophils Relative 1 %   Eosinophils Absolute 0.1 0 - 0.7 K/uL   Basophils Relative 1 %   Basophils Absolute 0.1 0 - 0.1 K/uL  Basic metabolic panel  Status: Abnormal   Collection Time: 02/06/17  9:20 AM  Result Value Ref Range   Sodium 138 135 - 145 mmol/L   Potassium 4.3 3.5 - 5.1 mmol/L   Chloride 99 (L) 101 - 111 mmol/L   CO2 30 22 - 32 mmol/L   Glucose, Bld 128 (H) 65 - 99 mg/dL   BUN 51 (H) 6 - 20 mg/dL   Creatinine, Ser 5.78 (H) 0.61 - 1.24 mg/dL   Calcium 7.9 (L) 8.9 - 10.3 mg/dL   GFR calc non Af Amer 11 (L) >60 mL/min   GFR calc Af Amer 12 (L) >60 mL/min    Comment: (NOTE) The eGFR has been calculated using the CKD EPI equation. This calculation has not been validated in all clinical situations. eGFR's persistently <60 mL/min signify possible Chronic Kidney Disease.    Anion gap 9 5 - 15  Troponin I     Status: Abnormal   Collection Time: 02/06/17  9:20 AM  Result Value Ref Range   Troponin I 0.03 (HH) <0.03 ng/mL    Comment: CRITICAL RESULT CALLED TO, READ BACK BY AND VERIFIED WITH  STEPHEN JONES AT 1003 02/06/17 SDR   Lactate dehydrogenase (pleural or peritoneal fluid)     Status: Abnormal   Collection Time: 02/06/17 11:11 AM  Result Value Ref Range   LD, Fluid 71 (H) 3 - 23 U/L    Comment: (NOTE) Results should be evaluated in conjunction with serum values    Fluid Type-FLDH PERITONEAL     Comment: CORRECTED ON 05/08 AT 1127: PREVIOUSLY REPORTED AS CYTOPERI  Body fluid cell count with differential     Status: Abnormal   Collection Time: 02/06/17  11:11 AM  Result Value Ref Range   Fluid Type-FCT PERITONEAL     Comment: CORRECTED ON 05/08 AT 1127: PREVIOUSLY REPORTED AS CYTOPERI   Color, Fluid YELLOW YELLOW   Appearance, Fluid HAZY (A) CLEAR   WBC, Fluid 503 cu mm   Neutrophil Count, Fluid 39 %   Lymphs, Fluid 8 %   Monocyte-Macrophage-Serous Fluid 53 %   Eos, Fluid 0 %  Protein, pleural or peritoneal fluid     Status: None   Collection Time: 02/06/17 11:11 AM  Result Value Ref Range   Total protein, fluid 3.1 g/dL    Comment: (NOTE) No normal range established for this test Results should be evaluated in conjunction with serum values    Fluid Type-FTP PERITONEAL     Comment: CORRECTED ON 05/08 AT 1127: PREVIOUSLY REPORTED AS CYTOPERI   No components found for: ESR, C REACTIVE PROTEIN MICRO: Recent Results (from the past 720 hour(s))  Body fluid culture     Status: None   Collection Time: 01/15/17 10:05 AM  Result Value Ref Range Status   Specimen Description PERITONEAL  Final   Special Requests NONE  Final   Gram Stain   Final    RARE WBC PRESENT, PREDOMINANTLY MONONUCLEAR NO ORGANISMS SEEN    Culture   Final    NO GROWTH 3 DAYS Performed at [facility], [address] [place], [place] [address]    Report Status 01/18/2017 FINAL  Final  MRSA PCR Screening     Status: None   Collection Time: 01/15/17 12:48 PM  Result Value Ref Range Status   MRSA by PCR NEGATIVE NEGATIVE Final    Comment:        The GeneXpert MRSA Assay (FDA approved for NASAL specimens only), is one component of a comprehensive  MRSA colonization surveillance program. It is not intended to diagnose MRSA infection nor to guide or monitor treatment for MRSA infections.   Body fluid culture     Status: None   Collection Time: 01/25/17  3:20 PM  Result Value Ref Range Status   Specimen Description PLEURAL  Final   Special Requests NONE  Final   Gram Stain   Final    RARE WBC PRESENT, PREDOMINANTLY MONONUCLEAR NO ORGANISMS SEEN     Culture   Final    NO GROWTH 3 DAYS Performed at [facility], [address], [place], [place] [address]    Report Status 01/29/2017 FINAL  Final    IMAGING: Ct Abdomen Pelvis Wo Contrast  Result Date: 01/09/2017 CLINICAL DATA:  Mid abdominal pain for 3 days with nausea and bloating. EXAM: CT ABDOMEN AND PELVIS WITHOUT CONTRAST TECHNIQUE: Multidetector CT imaging of the abdomen and pelvis was performed following the standard protocol without IV contrast. COMPARISON:  04/01/2014 FINDINGS: Lower chest: Large right pleural effusion is identified. There is subsegmental atelectasis involving the right middle lobe and right lower lobe. Hepatobiliary: No focal liver abnormality. No biliary dilatation. The gallbladder is unremarkable. Pancreas: Unremarkable. No pancreatic ductal dilatation or surrounding inflammatory changes. Spleen: Normal in size without focal abnormality. Adrenals/Urinary Tract: The adrenal glands are normal. Unremarkable appearance of the kidneys. Urinary bladder appears normal. Stomach/Bowel: The stomach appears normal. The small bowel loops have a normal course and caliber. No pathologic dilatation of the colon. Vascular/Lymphatic: Aortic atherosclerosis. No enlarged upper abdominal lymph nodes. No pelvic or inguinal adenopathy. Reproductive: Prostate is unremarkable. Other: Large volume of ascites is identified within the abdomen and pelvis. Musculoskeletal: Degenerative disc disease identified within the lower thoracic and lumbar spine. No aggressive lytic or sclerotic bone lesions. IMPRESSION: 1. Large right pleural effusion. 2. Large volume of ascites identified within the abdomen and pelvis. 3. Aortic atherosclerosis. Electronically Signed   By: Kerby Moors M.D.   On: 01/09/2017 13:49   Dg Chest 1 View  Result Date: 01/25/2017 CLINICAL DATA:  Status post right thoracentesis today. EXAM: CHEST 1 VIEW COMPARISON:  PA and lateral chest 01/25/2017. FINDINGS: Large right  pleural effusion is somewhat decreased after thoracentesis. No pneumothorax. Right basilar airspace disease is unchanged. Left lung demonstrates a small focus of linear atelectasis or scar, unchanged. Cardiac silhouette is largely obscured. Dialysis catheter noted. IMPRESSION: Some decrease in a right pleural effusion after thoracentesis. Negative for pneumothorax or other new abnormality. Electronically Signed   By: Inge Rise M.D.   On: 01/25/2017 16:01   Dg Chest 1 View  Result Date: 01/19/2017 CLINICAL DATA:  Shortness of breath. EXAM: CHEST 1 VIEW COMPARISON:  Radiographs of December 05, 2016. FINDINGS: Stable cardiomediastinal silhouette. Left internal jugular dialysis catheter is noted with distal tip in expected position of cavoatrial junction. No pneumothorax is noted. Left lung is clear. Moderate right pleural effusion is noted with probable underlying atelectasis or infiltrate. Bony thorax is unremarkable. IMPRESSION: Moderate right pleural effusion with probable underlying atelectasis or infiltrate. Electronically Signed   By: Marijo Conception, M.D.   On: 01/19/2017 16:55   Dg Chest 2 View  Result Date: 02/06/2017 CLINICAL DATA:  Shortness of Breath EXAM: CHEST  2 VIEW COMPARISON:  01/25/2017 FINDINGS: Cardiac shadow is stable. Dialysis catheter is again seen in satisfactory position. The left lung remains clear. Persistent and enlarging right pleural effusion is noted. No bony abnormality is seen. IMPRESSION: Persistent right-sided pleural effusion with increase when compared  with the prior study. Electronically Signed   By: Alcide Clever M.D.   On: 02/06/2017 09:47   Dg Chest 2 View  Result Date: 01/25/2017 CLINICAL DATA:  Dizziness and shortness of breath beginning yesterday. EXAM: CHEST  2 VIEW COMPARISON:  01/19/2017 FINDINGS: Central line is unchanged. Tips are in the SVC just above the right atrium. Large right effusion persists with subtotal collapse of the right lung. This may be even  slightly larger. The left chest remains clear. No pleural fluid on that side. IMPRESSION: Large right effusion with subtotal collapse of the right lung. This is probably even slightly larger than was seen 6 days ago. Electronically Signed   By: Paulina Fusi M.D.   On: 01/25/2017 10:32   US Paracentesis  Result Date: 02/06/2017 INDICATION: Ascites EXAM: ULTRASOUND GUIDED  PARACENTESIS MEDICATIONS: None. COMPLICATIONS: None immediate. PROCEDURE: Informed written consent was obtained from the patient after a discussion of the risks, benefits and alternatives to treatment. A timeout was performed prior to the initiation of the procedure. Initial ultrasound scanning demonstrates a moderate amount of ascites within the right lower abdominal quadrant. The right lower abdomen was prepped and draped in the usual sterile fashion. 1% lidocaine with epinephrine was used for local anesthesia. Following this, a Safe-T-Centesis catheter was introduced. An ultrasound image was saved for documentation purposes. The paracentesis was performed. The catheter was removed and a dressing was applied. The patient tolerated the procedure well without immediate post procedural complication. FINDINGS: A total of approximately 2.9 L of clear yellow fluid was removed. IMPRESSION: Successful ultrasound-guided paracentesis yielding 2.9 liters of peritoneal fluid. Electronically Signed   By: Jolaine Click M.D.   On: 02/06/2017 12:30   US Paracentesis  Result Date: 01/26/2017 CLINICAL DATA:  End-stage renal disease, cardiomyopathy, recurrent large volume abdominal ascites EXAM: ULTRASOUND GUIDED PARACENTESIS TECHNIQUE: The procedure, risks (including but not limited to bleeding, infection, organ damage ), benefits, and alternatives were explained to the patient. Questions regarding the procedure were encouraged and answered. The patient understands and consents to the procedure. Survey ultrasound of the abdomen was performed and an  appropriate skin entry site in the abdomen was selected. Skin site was marked, prepped with chlorhexidine, and draped in usual sterile fashion, and infiltrated locally with 1% lidocaine. A Safe-T-Centesis needle was advanced into the peritoneal space until fluid could be aspirated. The sheath was advanced and the needle removed. 4.2 L of amber clearascites were aspirated. Postprocedure scan shows no significant residual fluid. The patient tolerated the procedure well. COMPLICATIONS: COMPLICATIONS none IMPRESSION: Technically successful ultrasound guided paracentesis, removing 4.2 L of ascites. Electronically Signed   By: Corlis Leak M.D.   On: 01/26/2017 15:09   US Paracentesis  Result Date: 01/15/2017 INDICATION: Recurrent ascites, abdominal pain, distention, concern for bacterial peritonitis. EXAM: ULTRASOUND GUIDED PARACENTESIS MEDICATIONS: 1% lidocaine locally COMPLICATIONS: None immediate. PROCEDURE: An ultrasound guided paracentesis was thoroughly discussed with the patient and questions answered. The benefits, risks, alternatives and complications were also discussed. The patient understands and wishes to proceed with the procedure. Written consent was obtained. Ultrasound was performed to localize and mark an adequate pocket of fluid in the right lower quadrant of the abdomen. The area was then prepped and draped in the normal sterile fashion. 1% Lidocaine was used for local anesthesia. Under ultrasound guidance a safety centesis needle catheter was introduced. Paracentesis was performed. The catheter was removed and a dressing applied. FINDINGS: A total of approximately 5.35 L of amber colored peritoneal fluid was  removed. A fluid sample was sent for culture. IMPRESSION: Successful ultrasound guided paracentesis yielding 5.35 L of ascites. Electronically Signed   By: Jerilynn Mages.  Shick M.D.   On: 01/15/2017 10:49   US Thoracentesis Asp Pleural Space W/img Guide  Result Date: 01/25/2017 INDICATION: End-stage  renal disease, cardiomyopathy, dyspnea, right pleural effusion; request made for diagnostic and therapeutic right thoracentesis. EXAM: ULTRASOUND GUIDED DIAGNOSTIC AND THERAPEUTIC RIGHT THORACENTESIS MEDICATIONS: None. COMPLICATIONS: None immediate. PROCEDURE: An ultrasound guided thoracentesis was thoroughly discussed with the patient and questions answered. The benefits, risks, alternatives and complications were also discussed. The patient understands and wishes to proceed with the procedure. Written consent was obtained. Ultrasound was performed to localize and mark an adequate pocket of fluid in the right chest. The area was then prepped and draped in the normal sterile fashion. 1% Lidocaine was used for local anesthesia. Under ultrasound guidance a Safe-T-Centesis catheter was introduced. Thoracentesis was performed. The catheter was removed and a dressing applied. FINDINGS: A total of approximately 2 liters of yellow fluid was removed. Samples were sent to the laboratory as requested by the clinical team. IMPRESSION: Successful ultrasound guided diagnostic and therapeutic right thoracentesis yielding 2 liters of pleural fluid. Read by: Rowe Robert, PA-C Electronically Signed   By: Sandi Mariscal M.D.   On: 01/25/2017 15:42    Assessment:   Juan Roberson. is a 46 y.o. male with ESRD,  DM, ? Cardiac cirrhosis, prior MRSA knee infection admitted with recurrent Sob, abd distention, recurrent ascites and increasing pleural effusion on R.  He is HIV, Hep B and C negative.  He had CT abd done 4/10 with no evidence of infection. All cultures have been negative. He has been on ciprofloxacin as otpt. Bactrim allergic.  I am on convinced he has currently SBP as PMN count in ascitic fluid is <250 and all cultures have been negative. He also has no fevers, or leukocytosis.  Recommendations Check CT Cont ceftriaxone but if cultures negative I think he should have these stopped and remain off abx altogether.   If he has repeat paracentesis it would be best to send cultures while off of all antibiotics Thank you very much for allowing me to participate in the care of this patient. Please call with questions.   Cheral Marker. Ola Spurr, MD

## 2017-02-06 NOTE — Care Management Note (Signed)
Case Management Note  Patient Details  Name: Juan BlossomWaltzie L Warning Jr. MRN: [id number] Date of Birth: 12/01/1970  Subjective/Objective:                 Admitted with sx consistent with SBP. [name] has access to a walker provided last admission.  Lives with daughter.  Denies issues with transportation or ability to access medical care.  Action/Plan:Notified Juan ChyleAmanda Roberson with [facility] [employer] Of admission.  Expected Discharge Date:                  Expected Discharge Plan:     In-House Referral:     Discharge planning Services     Post Acute Care Choice:    Choice offered to:     DME Arranged:    DME Agency:     HH Arranged:    HH Agency:     Status of Service:     If discussed at [employer] Meetings, dates discussed:    Additional Comments:  Juan HongGreene, Juan Hinnenkamp R, RN 02/06/2017, 2:42 PM

## 2017-02-06 NOTE — Consult Note (Addendum)
Juan Bellows MD  [address] [place], [place] [address] Phone: [phone] Fax : [phone]  Consultation  Referring Provider:     Dr Verdell Carmine Primary Care Physician:  Cletis Athens, MD Primary Gastroenterologist:  None         Reason for Consultation:     SBP  Date of Admission:  02/06/2017 Date of Consultation:  02/06/2017         HPI:   Juan Roberson. is a 46 y.o. male with a history of CHF , recurrent ascites, ESRD on dialysis , EF of 20%. He was discharged on 01/18/2017 and was diagnosed with SBP. The neutrophil count met criteria for SBP, the cultures were negative(culture negative neutrocytic ascites). He was treated with antibiotics. Discharged on Ciprofloxacin   Today he returned to the ER with abdominal distension and shortness of breath - he underwent a paracentesis and had 2.5 litres of fluid aspirated , WCC > 500 , 39% neutrophils . Which is a PMN count of 196 . Total fluid protein is 3.1with elevated LDH.    Today has no abdominal pain , fevers. Says he feels well.   Past Medical History:  Diagnosis Date  . Cellulitis and abscess of foot    Left-Dr. Vickki Muff  . CHF (congestive heart failure) (HCC)    EF 20%  . CKD (chronic kidney disease), stage III   . Diabetes mellitus without complication ([place])    a. Dx ~ 1996.  [name] Dysrhythmia   . Essential hypertension   . Gastritis    a. 04/2015 hematemesis -> EGD: gastritis, esophagitis, duodenitis.  No active bleeding.  PPI added.  [name] GERD (gastroesophageal reflux disease)   . Left leg DVT ([place])    a. Dx 05/2015 -> Coumadin.  . MI (myocardial infarction) ([place])   . Osteomyelitis ([place])    a. 05/2015 L foot.    Past Surgical History:  Procedure Laterality Date  . CARDIAC CATHETERIZATION Right 10/26/2016   Procedure: Left Heart Cath and Coronary Angiography;  Surgeon: Dionisio David, MD;  Location: [facility];  Service: Cardiovascular;  Laterality: Right;  . DIALYSIS/PERMA CATHETER INSERTION N/A 01/17/2017   Procedure: Dialysis/Perma Catheter Insertion;  Surgeon: Algernon Huxley, MD;  Location: [facility];  Service: Cardiovascular;  Laterality: N/A;  . DIALYSIS/PERMA CATHETER REMOVAL N/A 12/05/2016   Procedure: Dialysis/Perma Catheter Removal;  Surgeon: Katha Cabal, MD;  Location: [facility];  Service: Cardiovascular;  Laterality: N/A;  . ESOPHAGOGASTRODUODENOSCOPY N/A 04/07/2015   Procedure: ESOPHAGOGASTRODUODENOSCOPY (EGD);  Surgeon: Lucilla Lame, MD;  Location: [facility] ENDOSCOPY;  Service: Endoscopy;  Laterality: N/A;  . ESOPHAGOGASTRODUODENOSCOPY (EGD) WITH PROPOFOL Left 06/30/2015   Procedure: ESOPHAGOGASTRODUODENOSCOPY (EGD) WITH PROPOFOL;  Surgeon: Lollie Sails, MD;  Location: [facility] ENDOSCOPY;  Service: Endoscopy;  Laterality: Left;  . FOOT SURGERY    . I&D EXTREMITY Left 04/06/2015   Procedure: IRRIGATION AND DEBRIDEMENT EXTREMITY;  Surgeon: Samara Deist, DPM;  Location: [facility];  Service: Podiatry;  Laterality: Left;  . IRRIGATION AND DEBRIDEMENT FOOT Left 05/21/2015   Procedure: IRRIGATION AND DEBRIDEMENT FOOT;  Surgeon: Sharlotte Alamo, MD;  Location: [facility];  Service: Podiatry;  Laterality: Left;  . IRRIGATION AND DEBRIDEMENT KNEE Right 10/18/2016   Procedure: IRRIGATION AND DEBRIDEMENT KNEE;  Surgeon: Earnestine Leys, MD;  Location: [facility];  Service: Orthopedics;  Laterality: Right;  . KNEE ARTHROSCOPY  10/18/2016   Procedure: ARTHROSCOPY KNEE;  Surgeon: Earnestine Leys, MD;  Location: [facility];  Service: Orthopedics;;  . PERIPHERAL  VASCULAR CATHETERIZATION N/A 10/27/2016   Procedure: Dialysis/Perma Catheter Insertion;  Surgeon: Algernon Huxley, MD;  Location: [facility];  Service: Cardiovascular;  Laterality: N/A;    Prior to Admission medications   Medication Sig Start Date End Date Taking? Authorizing Provider  amiodarone (PACERONE) 200 MG tablet Take 1 tablet (200 mg total) by mouth daily. 09/25/16  Yes Demetrios Loll, MD  aspirin EC 81 MG EC tablet Take 1 tablet (81 mg  total) by mouth daily. 07/23/16  Yes Wieting, Richard, MD  bisacodyl (DULCOLAX) 10 MG suppository Place 1 suppository (10 mg total) rectally daily as needed for moderate constipation. 10/27/16  Yes Vaughan Basta, MD  BREO ELLIPTA 100-25 MCG/INH AEPB Inhale 1 puff into the lungs daily.  11/09/16  Yes [provider]  carvedilol (COREG) 3.125 MG tablet Take 1 tablet (3.125 mg total) by mouth 2 (two) times daily with a meal. 10/27/16  Yes Vaughan Basta, MD  ciprofloxacin (CIPRO) 500 MG tablet Take 1 tablet (500 mg total) by mouth daily. 01/19/17  Yes Fritzi Mandes, MD  furosemide (LASIX) 80 MG tablet Take 1 tablet (80 mg total) by mouth 2 (two) times daily. 10/31/16  Yes Gladstone Lighter, MD  insulin aspart (NOVOLOG) 100 UNIT/ML injection Inject 4 Units into the skin 3 (three) times daily with meals. 10/31/16  Yes Gladstone Lighter, MD  lisinopril (PRINIVIL,ZESTRIL) 5 MG tablet Take 1 tablet (5 mg total) by mouth daily. 01/20/17  Yes Fritzi Mandes, MD  methimazole (TAPAZOLE) 10 MG tablet Take 1 tablet (10 mg total) by mouth daily. 07/22/16  Yes Wieting, Richard, MD  multivitamin (RENA-VIT) TABS tablet Take 1 tablet by mouth at bedtime. 01/19/17  Yes Fritzi Mandes, MD  pantoprazole (PROTONIX) 40 MG tablet Take 1 tablet (40 mg total) by mouth daily. 09/25/16  Yes Demetrios Loll, MD  spironolactone (ALDACTONE) 25 MG tablet Take 1 tablet (25 mg total) by mouth daily. 12/09/16  Yes Gladstone Lighter, MD  traMADol (ULTRAM) 50 MG tablet Take 50 mg by mouth daily as needed.  01/09/17  Yes [provider]  zolpidem (AMBIEN) 5 MG tablet Take 5 mg by mouth at bedtime as needed for sleep.  11/14/16  Yes [provider]  Nutritional Supplements (FEEDING SUPPLEMENT, NEPRO CARB STEADY,) LIQD Take 237 mLs by mouth 2 (two) times daily between meals. Patient not taking: Reported on 01/25/2017 10/27/16   Vaughan Basta, MD    Family History  Problem Relation Age of Onset  . Coronary  artery disease Father   . Pancreatic cancer Mother   . Breast cancer Sister   . Lung cancer Brother   . Cervical cancer Sister      Social History  Substance Use Topics  . Smoking status: Never Smoker  . Smokeless tobacco: Never Used  . Alcohol use No    Allergies as of 02/06/2017 - Review Complete 02/06/2017  Allergen Reaction Noted  . Sulfur Other (See Comments) 04/02/2015    Review of Systems:    All systems reviewed and negative except where noted in HPI.   Physical Exam:  Vital signs in last 24 hours: Temp:  [98.1 F (36.7 C)] 98.1 F (36.7 C) (05/08 0824) Pulse Rate:  [67-89] 89 (05/08 1200) Resp:  [18-21] 21 (05/08 1200) BP: (84-109)/(52-74) 97/64 (05/08 1150) SpO2:  [94 %-100 %] 96 % (05/08 1200) Weight:  [178 lb (80.7 kg)] 178 lb (80.7 kg) (05/08 0825)   General:   Pleasant, cooperative in NAD, sitting up right not short of breath  at 45 degrees  Head:  Normocephalic and atraumatic. Eyes:   No icterus.   Conjunctiva pink. PERRLA. Ears:  Normal auditory acuity. Neck:  Supple; no masses or thyroidomegaly Lungs: decreased air entry b/l  Heart:  Regular rate and rhythm;  Without murmur, clicks, rubs or gallops Abdomen:  Soft, nondistended, nontender. Normal bowel sounds. No appreciable masses or hepatomegaly.  No rebound or guarding.  Rectal:  Not performed. Extremities:  Without edema, cyanosis or clubbing. Neurologic:  Alert and oriented x3;  grossly normal neurologically. Skin:  Intact without significant lesions or rashes. Cervical Nodes:  No significant cervical adenopathy. Psych:  Alert and cooperative. Normal affect.  LAB RESULTS:  Recent Labs  02/06/17 0920  WBC 5.6  HGB 8.7*  HCT 27.9*  PLT 270   BMET  Recent Labs  02/06/17 0920  NA 138  K 4.3  CL 99*  CO2 30  GLUCOSE 128*  BUN 51*  CREATININE 5.78*  CALCIUM 7.9*   LFT No results for input(s): PROT, ALBUMIN, AST, ALT, ALKPHOS, BILITOT, BILIDIR, IBILI in the last 72  hours. PT/INR No results for input(s): LABPROT, INR in the last 72 hours.  STUDIES: Dg Chest 2 View  Result Date: 02/06/2017 CLINICAL DATA:  Shortness of Breath EXAM: CHEST  2 VIEW COMPARISON:  01/25/2017 FINDINGS: Cardiac shadow is stable. Dialysis catheter is again seen in satisfactory position. The left lung remains clear. Persistent and enlarging right pleural effusion is noted. No bony abnormality is seen. IMPRESSION: Persistent right-sided pleural effusion with increase when compared with the prior study. Electronically Signed   By: Inez Catalina M.D.   On: 02/06/2017 09:47   US Paracentesis  Result Date: 02/06/2017 INDICATION: Ascites EXAM: ULTRASOUND GUIDED  PARACENTESIS MEDICATIONS: None. COMPLICATIONS: None immediate. PROCEDURE: Informed written consent was obtained from the patient after a discussion of the risks, benefits and alternatives to treatment. A timeout was performed prior to the initiation of the procedure. Initial ultrasound scanning demonstrates a moderate amount of ascites within the right lower abdominal quadrant. The right lower abdomen was prepped and draped in the usual sterile fashion. 1% lidocaine with epinephrine was used for local anesthesia. Following this, a Safe-T-Centesis catheter was introduced. An ultrasound image was saved for documentation purposes. The paracentesis was performed. The catheter was removed and a dressing was applied. The patient tolerated the procedure well without immediate post procedural complication. FINDINGS: A total of approximately 2.9 L of clear yellow fluid was removed. IMPRESSION: Successful ultrasound-guided paracentesis yielding 2.9 liters of peritoneal fluid. Electronically Signed   By: Marybelle Killings M.D.   On: 02/06/2017 12:30      Impression / Plan:   El Pile. is a 46 y.o. y/o male with concersn of SBP. He was recently discharged on ciprofloxacin which he recalls took for 7 days after he was diagnosed with SBP in the  hospital. He presents with recurrence of ascites today and ascitic fluid technically did not meet criteria for SBP although WCC was > 500. It may represent partially treated SBP . There has been a lot of resistance to fluroquinolones in terms of SBP and at this point it is possible that he has incompletely treated SBP from the initial diagnosis rather than a recurrence. So far no biochemical or radiological evidence of liver cirrhosis .    Plan  1. Follow up ascitic fluid cultures.  2. Suggest a CT abdomen and pelvis to r/o causes for secondary bacterial peritonitis  3. I have personally discussed this scenario  with Dr Ola Spurr and will continue with ceftriaxone at this time .    ?Repeat ascitic fluid analysis in 2-3 days to check for improvement  4. Will eventually need life long SBP prophylaxsis which is more harder in his case as he is allergic to sulphas  5. Check INR   Thank you for involving me in the care of this patient.      LOS: 0 days   Juan Bellows, MD  02/06/2017, 1:09 PM

## 2017-02-06 NOTE — Procedures (Signed)
RLQ US guided paracentesis Amount pending EBL 0 Comp 0

## 2017-02-06 NOTE — ED Notes (Signed)
Date and time results received: 02/06/17 10:04 AM (use smartphrase ".now" to insert current time)  Test: Troponin Critical Value: 0.03  Name of Provider Notified: Dr. Don PerkingVeronese  Orders Received? Or Actions Taken?: No new orders

## 2017-02-06 NOTE — ED Triage Notes (Signed)
FIRST NURSE: pt sent over per Dr Cherylann RatelLateef for paracentesis. Missed dialysis on Saturday due to a family reunion and is supposed to have today at 1215, but was told to come to ER this am.

## 2017-02-06 NOTE — ED Notes (Signed)
Pt given renal diet tray and 7.5oz diet gingerale.

## 2017-02-06 NOTE — ED Provider Notes (Signed)
[facility] Emergency Department Provider Note  ____________________________________________  Time seen: Approximately 9:08 AM  I have reviewed the triage vital signs and the nursing notes.   HISTORY  Chief Complaint Shortness of Breath   HPI Juan Roberson. is a 46 y.o. 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 He missed dialysis on Saturday due to a family event. Since then he has had progressively worsening abdominal distention and shortness of breath. Shortness of breath is mild at rest while sitting up but he becomes severe when he lays flat. Also complaining of sharp severe abdominal pain located in the center of his abdomen for 3 days. No fever, chills, nausea, vomiting, diarrhea, chest pain. Patient's daughter contacted Dr. Cherylann Ratel yesterday who was trying to get patient schedules an outpatient for paracentesis so patient could undergo dialysis today. Dr. Cherylann Ratel was unable to get the appointment as an outpatient and sent patient here today for further evaluation.  Past Medical History:  Diagnosis Date  . Cellulitis and abscess of foot    Left-Dr. Ether Griffins  . CHF (congestive heart failure) (HCC)    EF 20%  . CKD (chronic kidney disease), stage III   . Diabetes mellitus without complication (HCC)    a. Dx ~ 1996.  [name] Dysrhythmia   . Essential hypertension   . Gastritis    a. 04/2015 hematemesis -> EGD: gastritis, esophagitis, duodenitis.  No active bleeding.  PPI added.  [name] GERD (gastroesophageal reflux disease)   . Left leg DVT (HCC)    a. Dx 05/2015 -> Coumadin.  . MI (myocardial infarction) (HCC)   . Osteomyelitis (HCC)    a. 05/2015 L foot.     Patient Active Problem List   Diagnosis Date Noted  . Pleural effusion 01/25/2017  . CHF exacerbation (HCC) 01/14/2017  . Acute on chronic renal failure (HCC) 01/09/2017  . SBP (spontaneous bacterial peritonitis) (HCC) 12/05/2016  . Hyperglycemia 10/28/2016  . Sepsis (HCC) 10/17/2016  . Chest pain 09/24/2016  . Acute-on-chronic kidney injury (HCC) 09/24/2016    Past Surgical History:  Procedure Laterality Date  . CARDIAC CATHETERIZATION Right 10/26/2016   Procedure: Left Heart Cath and Coronary Angiography;  Surgeon: Laurier Nancy, MD;  Location: [facility] INV[facility]IVE [facility];  Service: Cardiovascular;  Laterality: Right;  . DIALYSIS/PERMA CATHETER INSERTION N/A 01/17/2017   Procedure: Dialysis/Perma Catheter Insertion;  Surgeon: Annice Needy, MD;  Location: [facility];  Service: Cardiovascular;  Laterality: N/A;  . DIALYSIS/PERMA CATHETER REMOVAL N/A 12/05/2016   Procedure: Dialysis/Perma Catheter Removal;  Surgeon: Renford Dills, MD;  Location: [facility];  Service: Cardiovascular;  Laterality: N/A;  . ESOPHAGOGASTRODUODENOSCOPY N/A 04/07/2015   Procedure: ESOPHAGOGASTRODUODENOSCOPY (EGD);  Surgeon: Midge Minium, MD;  Location: [facility] ENDOSCOPY;  Service: Endoscopy;  Laterality: N/A;  . ESOPHAGOGASTRODUODENOSCOPY (EGD) WITH PROPOFOL Left 06/30/2015   Procedure: ESOPHAGOGASTRODUODENOSCOPY (EGD) WITH PROPOFOL;  Surgeon: Christena Deem, MD;  Location: [facility] ENDOSCOPY;  Service: Endoscopy;  Laterality: Left;  . FOOT SURGERY    . I&D EXTREMITY Left 04/06/2015   Procedure: IRRIGATION AND DEBRIDEMENT EXTREMITY;  Surgeon: Gwyneth Revels, DPM;  Location: [facility] ORS;  Service: Podiatry;  Laterality: Left;  . IRRIGATION AND DEBRIDEMENT FOOT Left 05/21/2015   Procedure: IRRIGATION AND  DEBRIDEMENT FOOT;  Surgeon: Linus Galas, MD;  Location: [facility] ORS;  Service: Podiatry;  Laterality: Left;  . IRRIGATION AND DEBRIDEMENT KNEE Right 10/18/2016   Procedure: IRRIGATION AND DEBRIDEMENT KNEE;   Surgeon: Deeann Saint, MD;  Location: [facility] ORS;  Service: Orthopedics;  Laterality: Right;  . KNEE ARTHROSCOPY  10/18/2016   Procedure: ARTHROSCOPY KNEE;  Surgeon: Deeann Saint, MD;  Location: [facility] ORS;  Service: Orthopedics;;  . PERIPHERAL VASCULAR CATHETERIZATION N/A 10/27/2016   Procedure: Dialysis/Perma Catheter Insertion;  Surgeon: Annice Needy, MD;  Location: [facility];  Service: Cardiovascular;  Laterality: N/A;    Prior to Admission medications   Medication Sig Start Date End Date Taking? Authorizing Provider  amiodarone (PACERONE) 200 MG tablet Take 1 tablet (200 mg total) by mouth daily. 09/25/16  Yes Shaune Pollack, MD  aspirin EC 81 MG EC tablet Take 1 tablet (81 mg total) by mouth daily. 07/23/16  Yes Wieting, Richard, MD  bisacodyl (DULCOLAX) 10 MG suppository Place 1 suppository (10 mg total) rectally daily as needed for moderate constipation. 10/27/16  Yes Altamese Dilling, MD  BREO ELLIPTA 100-25 MCG/INH AEPB Inhale 1 puff into the lungs daily.  11/09/16  Yes [provider]  carvedilol (COREG) 3.125 MG tablet Take 1 tablet (3.125 mg total) by mouth 2 (two) times daily with a meal. 10/27/16  Yes Altamese Dilling, MD  ciprofloxacin (CIPRO) 500 MG tablet Take 1 tablet (500 mg total) by mouth daily. 01/19/17  Yes Enedina Finner, MD  furosemide (LASIX) 80 MG tablet Take 1 tablet (80 mg total) by mouth 2 (two) times daily. 10/31/16  Yes Enid Baas, MD  insulin aspart (NOVOLOG) 100 UNIT/ML injection Inject 4 Units into the skin 3 (three) times daily with meals. 10/31/16  Yes Enid Baas, MD  lisinopril (PRINIVIL,ZESTRIL) 5 MG tablet Take 1 tablet (5 mg total) by mouth daily. 01/20/17  Yes Enedina Finner, MD  methimazole (TAPAZOLE) 10 MG tablet Take 1 tablet (10 mg total) by mouth daily. 07/22/16  Yes Wieting, Richard, MD  multivitamin (RENA-VIT) TABS tablet Take 1 tablet by mouth at bedtime. 01/19/17  Yes Enedina Finner, MD  pantoprazole (PROTONIX) 40 MG tablet  Take 1 tablet (40 mg total) by mouth daily. 09/25/16  Yes Shaune Pollack, MD  spironolactone (ALDACTONE) 25 MG tablet Take 1 tablet (25 mg total) by mouth daily. 12/09/16  Yes Enid Baas, MD  traMADol (ULTRAM) 50 MG tablet Take 50 mg by mouth daily as needed.  01/09/17  Yes [provider]  zolpidem (AMBIEN) 5 MG tablet Take 5 mg by mouth at bedtime as needed for sleep.  11/14/16  Yes [provider]  Nutritional Supplements (FEEDING SUPPLEMENT, NEPRO CARB STEADY,) LIQD Take 237 mLs by mouth 2 (two) times daily between meals. Patient not taking: Reported on 01/25/2017 10/27/16   Altamese Dilling, MD    Allergies Sulfur  Family History  Problem Relation Age of Onset  . Coronary artery disease Father   . Pancreatic cancer Mother   . Breast cancer Sister   . Lung cancer Brother   . Cervical cancer Sister     Social History Social History  Substance Use Topics  . Smoking status: Never Smoker  . Smokeless tobacco: Never Used  . Alcohol use No    Review of Systems  Constitutional: Negative for fever. Eyes: Negative for visual changes. ENT: Negative for sore throat. Neck: No neck pain  Cardiovascular: Negative for chest pain. Respiratory: + shortness of breath. Gastrointestinal: + abdominal pain and distention.  No vomiting or diarrhea. Genitourinary: Negative for dysuria. Musculoskeletal: Negative for back pain. Skin: Negative for rash. Neurological: Negative for headaches, weakness or numbness. Psych: No SI or HI  ____________________________________________   PHYSICAL EXAM:  VITAL SIGNS: ED Triage Vitals  Enc Vitals Group     BP 02/06/17 0824 103/66     Pulse Rate 02/06/17 0824 76     Resp 02/06/17 0824 18     Temp 02/06/17 0824 98.1 F (36.7 C)     Temp Source 02/06/17 0824 Oral     SpO2 02/06/17 0824 100 %     Weight 02/06/17 0825 178 lb (80.7 kg)     Height 02/06/17 0825 6\' 2"  (1.88 m)     Head Circumference --      Peak Flow --       Pain Score 02/06/17 0824 10     Pain Loc --      Pain Edu? --      Excl. in GC? --     Constitutional: Alert and oriented, chronically ill-appearing, no distress.  HEENT:      Head: Normocephalic and atraumatic.         Eyes: Conjunctivae are normal. Sclera is non-icteric. EOMI. PERRL      Mouth/Throat: Mucous membranes are moist.       Neck: Supple with no signs of meningismus. Cardiovascular: Regular rate and rhythm. No murmurs, gallops, or rubs. 2+ symmetrical distal pulses are present in all extremities. No JVD. Respiratory: Normal respiratory effort. Decreased breath sounds in the R lung. Normal on the left. No wheezes, crackles, or rhonchi.  Gastrointestinal: Soft, mildly distended, with diffuse ttp with positive bowel sounds. No rebound or guarding. Musculoskeletal: Nontender with normal range of motion in all extremities. No edema, cyanosis, or erythema of extremities. Neurologic: Normal speech and language. Face is symmetric. Moving all extremities. No gross focal neurologic deficits are appreciated. Skin: Skin is warm, dry and intact. No rash noted. Psychiatric: Mood and affect are normal. Speech and behavior are normal.  ____________________________________________   LABS (all labs ordered are listed, but only abnormal results are displayed)  Labs Reviewed  CBC WITH DIFFERENTIAL/PLATELET - Abnormal; Notable for the following:       Result Value   RBC 3.54 (*)    Hemoglobin 8.7 (*)    HCT 27.9 (*)    MCV 78.8 (*)    MCH 24.5 (*)    MCHC 31.1 (*)    RDW 19.1 (*)    All other components within normal limits  BASIC METABOLIC PANEL - Abnormal; Notable for the following:    Chloride 99 (*)    Glucose, Bld 128 (*)    BUN 51 (*)    Creatinine, Ser 5.78 (*)    Calcium 7.9 (*)    GFR calc non Af Amer 11 (*)    GFR calc Af Amer 12 (*)    All other components within normal limits  TROPONIN I - Abnormal; Notable for the following:    Troponin I 0.03 (*)    All other  components within normal limits  LACTATE DEHYDROGENASE, PLEURAL OR PERITONEAL FLUID - Abnormal; Notable for the following:    LD, Fluid 71 (*)    All other components within normal limits  BODY FLUID CELL COUNT WITH DIFFERENTIAL - Abnormal; Notable for the following:    Appearance, Fluid HAZY (*)    All other components within normal limits  BODY FLUID CULTURE  PROTEIN, PLEURAL OR PERITONEAL FLUID  PATHOLOGIST  SMEAR REVIEW   ____________________________________________  EKG  ED ECG REPORT I, Nita Sicklearolina Linley Moxley, the attending physician, personally viewed and interpreted this ECG.  Normal sinus rhythm, rate of 74, first-degree AV block, right bundle branch block and prolonged QTC, right axis deviation, no ST elevations or depressions. Unchanged from prior.  ____________________________________________  RADIOLOGY  CXR: Persistent right-sided pleural effusion with increase when compared with the prior study. ____________________________________________   PROCEDURES  Procedure(s) performed: yes Procedures   Bedside US showing moderate amount of ascites  Critical Care performed:  None ____________________________________________   INITIAL IMPRESSION / ASSESSMENT AND PLAN / ED COURSE  46 y.o. 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 Bedside ultrasound showing moderate amount of ascites and a right-sided pleural effusion. Patient is in no respiratory distress, has mildly distended abdomen with mild tenderness to palpation throughout. Patient definitely is volume overloaded and will need a paracentesis to help with his shortness of breath and also to rule out SBP. Patient will  also need dialysis. We'll get basic blood work, EKG, treat patient's pain with IV morphine, and admitted to the hospitalist service.    _________________________ 10:51 AM on 02/06/2017 -----------------------------------------  Labs showing no need for emergent dialysis. Paracentesis ordered to rule out SBP and to help with distention and SOB. CXR showing worsening R pleural effusion. Discussed with the Hospitalist for admission.  Pertinent labs & imaging results that were available during my care of the patient were reviewed by me and considered in my medical decision making (see chart for details).    ____________________________________________   FINAL CLINICAL IMPRESSION(S) / ED DIAGNOSES  Final diagnoses:  Ascites  Hypervolemia, unspecified hypervolemia type  Pleural effusion  Shortness of breath  SBP (spontaneous bacterial peritonitis) (HCC)      NEW MEDICATIONS STARTED DURING THIS VISIT:  Current Discharge Medication List       Note:  This document was prepared using Dragon voice recognition software and may include unintentional dictation errors.    Don PerkingVeronese, [place], MD 02/06/17 [phone]

## 2017-02-06 NOTE — ED Triage Notes (Signed)
ARrives c/o SOB, sent to ED from PCP for possible paracentesis.  Patient missed dialysis on Saturday.

## 2017-02-06 NOTE — H&P (Signed)
[employer] [place] [facility]   PATIENT NAME: Juan Roberson    MR#:  [id number]  DATE OF BIRTH:  08/29/1971  DATE OF ADMISSION:  02/06/2017  PRIMARY CARE PHYSICIAN: Corky DownsMasoud, Javed, MD   REQUESTING/REFERRING PHYSICIAN: Dr. Nita Sicklearolina Veronese  CHIEF COMPLAINT:   Chief Complaint  Patient presents with  . Shortness of Breath    HISTORY OF PRESENT ILLNESS:  Juan ButtersWaltzie Hedden  is a 46 y.o. male with a known history of End-stage renal disease on hemodialysis, ischemic cardiomyopathy ejection fraction of 10%, chronic systolic CHF, diabetes, hypertension, history of osteomyelitis, previous history of myocardial infarction who presents to the hospital due to abdominal distention and shortness of breath. Patient says that he's had worsening shortness of breath and abdominal distention over the past few days, his nephrologist was trying to arrange for an outpatient to some cases but he was not able to and therefore sent to the ER for further evaluation. Patient underwent a ultrasound guided paracentesis here for therapeutic and diagnostic purposes and had 2-1/2 L of fluid removed. Patient's fluid analysis is consistent with spontaneous bacterial peritonitis as his white cells are over 500. He denies any abdominal pain, fever, chills, nausea, vomiting or any other associated symptoms presently. Hospitalist services were contacted further treatment and evaluation.  PAST MEDICAL HISTORY:   Past Medical History:  Diagnosis Date  . Cellulitis and abscess of foot    Left-Dr. Ether GriffinsFowler  . CHF (congestive heart failure) (HCC)    EF 20%  . CKD (chronic kidney disease), stage III   . Diabetes mellitus without complication (HCC)    a. Dx ~ 1996.  [name]. Dysrhythmia   . Essential hypertension   . Gastritis    a. 04/2015 hematemesis -> EGD: gastritis, esophagitis, duodenitis.  No active bleeding.  PPI added.  [name]. GERD (gastroesophageal reflux disease)   . Left leg DVT (HCC)    a. Dx 05/2015 ->  Coumadin.  . MI (myocardial infarction) (HCC)   . Osteomyelitis (HCC)    a. 05/2015 L foot.    PAST SURGICAL HISTORY:   Past Surgical History:  Procedure Laterality Date  . CARDIAC CATHETERIZATION Right 10/26/2016   Procedure: Left Heart Cath and Coronary Angiography;  Surgeon: Laurier NancyShaukat A Khan, MD;  Location: [facility] INVASIVE CV LAB;  Service: Cardiovascular;  Laterality: Right;  . DIALYSIS/PERMA CATHETER INSERTION N/A 01/17/2017   Procedure: Dialysis/Perma Catheter Insertion;  Surgeon: Annice NeedyJason S Dew, MD;  Location: [facility] INV[facility] CV LAB;  Service: Cardiovascular;  Laterality: N/A;  . DIALYSIS/PERMA CATHETER REMOVAL N/A 12/05/2016   Procedure: Dialysis/Perma Catheter Removal;  Surgeon: Renford DillsGregory G Schnier, MD;  Location: [facility] INV[facility] CV [facility];  Service: Cardiovascular;  Laterality: N/A;  . ESOPHAGOGASTRODUODENOSCOPY N/A 04/07/2015   Procedure: ESOPHAGOGASTRODUODENOSCOPY (EGD);  Surgeon: Midge Miniumarren Wohl, MD;  [facility];  Service: Endoscopy;  Laterality: N/A;  . ESOPHAGOGASTRODUODENOSCOPY (EGD) WITH PROPOFOL Left 06/30/2015   Procedure: ESOPHAGOGASTRODUODENOSCOPY (EGD) WITH PROPOFOL;  Surgeon: Christena DeemMartin U Skulskie, MD;  [facility];  Service: Endoscopy;  Laterality: Left;  . FOOT SURGERY    . I&D EXTREMITY Left 04/06/2015   Procedure: IRRIGATION AND DEBRIDEMENT EXTREMITY;  Surgeon: Gwyneth RevelsJustin Fowler, DPM;  Location: [facility] ORS;  Service: Podiatry;  Laterality: Left;  . IRRIGATION AND DEBRIDEMENT FOOT Left 05/21/2015   Procedure: IRRIGATION AND DEBRIDEMENT FOOT;  Surgeon: Linus Galasodd Cline, MD;  Location: [facility] ORS;  Service: Podiatry;  Laterality: Left;  . IRRIGATION AND DEBRIDEMENT KNEE Right 10/18/2016   Procedure: IRRIGATION AND DEBRIDEMENT KNEE;  Surgeon: Deeann SaintHoward Miller,  MD;  Location: [facility] ORS;  Service: Orthopedics;  Laterality: Right;  . KNEE ARTHROSCOPY  10/18/2016   Procedure: ARTHROSCOPY KNEE;  Surgeon: Deeann Saint, MD;  Location: [facility] ORS;  Service: Orthopedics;;  . PERIPHERAL VASCULAR  CATHETERIZATION N/A 10/27/2016   Procedure: Dialysis/Perma Catheter Insertion;  Surgeon: Annice Needy, MD;  Location: [facility] INVASIVE CV LAB;  Service: Cardiovascular;  Laterality: N/A;    SOCIAL HISTORY:   Social History  Substance Use Topics  . Smoking status: Never Smoker  . Smokeless tobacco: Never Used  . Alcohol use No    FAMILY HISTORY:   Family History  Problem Relation Age of Onset  . Coronary artery disease Father   . Pancreatic cancer Mother   . Breast cancer Sister   . Lung cancer Brother   . Cervical cancer Sister     DRUG ALLERGIES:   Allergies  Allergen Reactions  . Sulfur Other (See Comments)    Pt states that this medication causes renal failure.      REVIEW OF SYSTEMS:   Review of Systems  Constitutional: Negative for fever and weight loss.  HENT: Negative for congestion, nosebleeds and tinnitus.   Eyes: Negative for blurred vision, double vision and redness.  Respiratory: Positive for shortness of breath. Negative for cough and hemoptysis.   Cardiovascular: Negative for chest pain, orthopnea, leg swelling and PND.  Gastrointestinal: Negative for abdominal pain, diarrhea, melena, nausea and vomiting.  Genitourinary: Negative for dysuria, hematuria and urgency.  Musculoskeletal: Negative for falls and joint pain.  Neurological: Negative for dizziness, tingling, sensory change, focal weakness, seizures, weakness and headaches.  Endo/Heme/Allergies: Negative for polydipsia. Does not bruise/bleed easily.  Psychiatric/Behavioral: Negative for depression and memory loss. The patient is not nervous/anxious.     MEDICATIONS AT HOME:   Prior to Admission medications   Medication Sig Start Date End Date Taking? Authorizing Provider  amiodarone (PACERONE) 200 MG tablet Take 1 tablet (200 mg total) by mouth daily. 09/25/16  Yes Shaune Pollack, MD  aspirin EC 81 MG EC tablet Take 1 tablet (81 mg total) by mouth daily. 07/23/16  Yes Wieting, Richard, MD  bisacodyl  (DULCOLAX) 10 MG suppository Place 1 suppository (10 mg total) rectally daily as needed for moderate constipation. 10/27/16  Yes Altamese Dilling, MD  BREO ELLIPTA 100-25 MCG/INH AEPB Inhale 1 puff into the lungs daily.  11/09/16  Yes [provider]  carvedilol (COREG) 3.125 MG tablet Take 1 tablet (3.125 mg total) by mouth 2 (two) times daily with a meal. 10/27/16  Yes Altamese Dilling, MD  ciprofloxacin (CIPRO) 500 MG tablet Take 1 tablet (500 mg total) by mouth daily. 01/19/17  Yes Enedina Finner, MD  furosemide (LASIX) 80 MG tablet Take 1 tablet (80 mg total) by mouth 2 (two) times daily. 10/31/16  Yes Enid Baas, MD  insulin aspart (NOVOLOG) 100 UNIT/ML injection Inject 4 Units into the skin 3 (three) times daily with meals. 10/31/16  Yes Enid Baas, MD  lisinopril (PRINIVIL,ZESTRIL) 5 MG tablet Take 1 tablet (5 mg total) by mouth daily. 01/20/17  Yes Enedina Finner, MD  methimazole (TAPAZOLE) 10 MG tablet Take 1 tablet (10 mg total) by mouth daily. 07/22/16  Yes Wieting, Richard, MD  multivitamin (RENA-VIT) TABS tablet Take 1 tablet by mouth at bedtime. 01/19/17  Yes Enedina Finner, MD  pantoprazole (PROTONIX) 40 MG tablet Take 1 tablet (40 mg total) by mouth daily. 09/25/16  Yes Shaune Pollack, MD  spironolactone (ALDACTONE) 25 MG tablet Take 1 tablet (25 mg total)  by mouth daily. 12/09/16  Yes Enid Baas, MD  traMADol (ULTRAM) 50 MG tablet Take 50 mg by mouth daily as needed.  01/09/17  Yes [provider]  zolpidem (AMBIEN) 5 MG tablet Take 5 mg by mouth at bedtime as needed for sleep.  11/14/16  Yes [provider]  Nutritional Supplements (FEEDING SUPPLEMENT, NEPRO CARB STEADY,) LIQD Take 237 mLs by mouth 2 (two) times daily between meals. Patient not taking: Reported on 01/25/2017 10/27/16   Altamese Dilling, MD      VITAL SIGNS:  Blood pressure 97/64, pulse 89, temperature 98.1 F (36.7 C), temperature source Oral, resp. rate (!) 21, height  6\' 2"  (1.88 m), weight 80.7 kg (178 lb), SpO2 96 %.  PHYSICAL EXAMINATION:  Physical Exam  GENERAL:  46 y.o.-year-old patient lying in bed in no acute distress.  EYES: Pupils equal, round, reactive to light and accommodation. No scleral icterus. Extraocular muscles intact.  HEENT: Head atraumatic, normocephalic. Oropharynx and nasopharynx clear. No oropharyngeal erythema, moist oral mucosa  NECK:  Supple, no jugular venous distention. No thyroid enlargement, no tenderness.  LUNGS: Poor air entry on the right lung base., no wheezing, rales, rhonchi. No use of accessory muscles of respiration.  CARDIOVASCULAR: S1, S2 RRR. No murmurs, rubs, gallops, clicks.  ABDOMEN: Soft, nontender, nondistended. Bowel sounds present. No organomegaly or mass.  EXTREMITIES: +1-2 dependent pedal edema bilaterally, No cyanosis, or clubbing. + 2 pedal & radial pulses b/l.   NEUROLOGIC: Cranial nerves II through XII are intact. No focal Motor or sensory deficits appreciated b/l PSYCHIATRIC: The patient is alert and oriented x 3. Good affect.  SKIN: No obvious rash, lesion, or ulcer.   LABORATORY PANEL:   CBC  Recent Labs Lab 02/06/17 0920  WBC 5.6  HGB 8.7*  HCT 27.9*  PLT 270   ------------------------------------------------------------------------------------------------------------------  Chemistries   Recent Labs Lab 02/06/17 0920  NA 138  K 4.3  CL 99*  CO2 30  GLUCOSE 128*  BUN 51*  CREATININE 5.78*  CALCIUM 7.9*   ------------------------------------------------------------------------------------------------------------------  Cardiac Enzymes  Recent Labs Lab 02/06/17 0920  TROPONINI 0.03*   ------------------------------------------------------------------------------------------------------------------  RADIOLOGY:  Dg Chest 2 View  Result Date: 02/06/2017 CLINICAL DATA:  Shortness of Breath EXAM: CHEST  2 VIEW COMPARISON:  01/25/2017 FINDINGS: Cardiac shadow is stable.  Dialysis catheter is again seen in satisfactory position. The left lung remains clear. Persistent and enlarging right pleural effusion is noted. No bony abnormality is seen. IMPRESSION: Persistent right-sided pleural effusion with increase when compared with the prior study. Electronically Signed   By: Alcide Clever M.D.   On: 02/06/2017 09:47   US Paracentesis  Result Date: 02/06/2017 INDICATION: Ascites EXAM: ULTRASOUND GUIDED  PARACENTESIS MEDICATIONS: None. COMPLICATIONS: None immediate. PROCEDURE: Informed written consent was obtained from the patient after a discussion of the risks, benefits and alternatives to treatment. A timeout was performed prior to the initiation of the procedure. Initial ultrasound scanning demonstrates a moderate amount of ascites within the right lower abdominal quadrant. The right lower abdomen was prepped and draped in the usual sterile fashion. 1% lidocaine with epinephrine was used for local anesthesia. Following this, a Safe-T-Centesis catheter was introduced. An ultrasound image was saved for documentation purposes. The paracentesis was performed. The catheter was removed and a dressing was applied. The patient tolerated the procedure well without immediate post procedural complication. FINDINGS: A total of approximately 2.9 L of clear yellow fluid was removed. IMPRESSION: Successful ultrasound-guided paracentesis yielding 2.9 liters of peritoneal fluid. Electronically  Signed   By: Jolaine Click M.D.   On: 02/06/2017 12:30     IMPRESSION AND PLAN:   46 year old male with past medical history of end-stage renal disease on hemodialysis, chronic systolic CHF, ischemic cardiomyopathy ejection fraction of 10%, history of osteomyelitis, previous history of MI, GERD presents to the hospital due to abdominal distention shortness of breath.  1. Spontaneous bacterial peritonitis-patient underwent a ultrasound-guided paracentesis for his abdominal distention and ascites and his  fluid analysis is consistent with SBP. -Patient was already on oral ciprofloxacin but despite that he has greater than 450 white cells in his peritoneal fluid. Clinically he is asymptomatic with no abdominal pain, afebrile with a normal white cell count. -We'll empirically start the patient on IV ceftriaxone, get a gastroenterology and also infectious disease consult. Follow clinically.  2. End-stage renal disease on hemodialysis-we'll consult nephrology, patient will resume his Tuesday Thursday Saturday schedule. Patient missed his dialysis this past Saturday.  3. History of diabetes type 2 without complication-continue NovoLog with meals.  4. Essential hypertension-continue carvedilol, lisinopril.  5. Hyperthyroidism - cont. methimazole.  6. GERD-continue Protonix.    All the records are reviewed and case discussed with ED provider. Management plans discussed with the patient, family and they are in agreement.  CODE STATUS: Full code  TOTAL TIME TAKING CARE OF THIS PATIENT: 45 minutes.    Houston Siren M.D on 02/06/2017 at 1:04 PM  Between 7am to 6pm - Pager - [phone]  After 6pm go to [email or web address] - password [email or web address] [place]  [place] [facility]  Office  [phone]  CC: Primary care physician; Corky Downs, MD

## 2017-02-06 NOTE — Progress Notes (Signed)
[facility]  ROUNDING NOTE   Subjective:  Patient came to the emergency room for paracentesis 2.9 L of clear yellow fluid was removed while ultrasound guided paracentesis Fluid analysis shows greater than 500 WBCs with 39% neutrophils suggesting peritonitis Patient reports abdominal pain No fevers or chills  Objective:  Vital signs in last 24 hours:  Temp:  [97.7 F (36.5 C)-98.1 F (36.7 C)] 97.7 F (36.5 C) (05/08 1409) Pulse Rate:  [67-102] 102 (05/08 1409) Resp:  [16-22] 20 (05/08 1409) BP: (84-121)/(52-84) 101/73 (05/08 1409) SpO2:  [94 %-100 %] 98 % (05/08 1409) Weight:  [80.7 kg (178 lb)] 80.7 kg (178 lb) (05/08 0825)  Weight change:  Filed Weights   02/06/17 0825  Weight: 80.7 kg (178 lb)    Intake/Output: No intake/output data recorded.   Intake/Output this shift:  No intake/output data recorded.  Physical Exam: General: No acute distress  Head: Normocephalic, atraumatic. Moist oral mucosal membranes  Eyes: Anicteric  Neck: Supple, trachea midline  Lungs:  diminised at bases, normal effort  Heart: S1S2 no rubs  Abdomen:  Soft, decreased distension,+ tenderness  Extremities: 1+ peripheral edema. b/l   Neurologic: Awake, alert, following commands  Skin: No lesions  Access: IJ permcath    Basic Metabolic Panel:  Recent Labs Lab 02/06/17 0920  NA 138  K 4.3  CL 99*  CO2 30  GLUCOSE 128*  BUN 51*  CREATININE 5.78*  CALCIUM 7.9*    Liver Function Tests: No results for input(s): AST, ALT, ALKPHOS, BILITOT, PROT, ALBUMIN in the last 168 hours. No results for input(s): LIPASE, AMYLASE in the last 168 hours. No results for input(s): AMMONIA in the last 168 hours.  CBC:  Recent Labs Lab 02/06/17 0920  WBC 5.6  NEUTROABS 4.0  HGB 8.7*  HCT 27.9*  MCV 78.8*  PLT 270    Cardiac Enzymes:  Recent Labs Lab 02/06/17 0920  TROPONINI 0.03*    BNP: Invalid input(s): POCBNP  CBG:  Recent Labs Lab 02/06/17 1745  GLUCAP  151*    Microbiology: Results for orders placed or performed during the hospital encounter of 01/25/17  Body fluid culture     Status: None   Collection Time: 01/25/17  3:20 PM  Result Value Ref Range Status   Specimen Description PLEURAL  Final   Special Requests NONE  Final   Gram Stain   Final    RARE WBC PRESENT, PREDOMINANTLY MONONUCLEAR NO ORGANISMS SEEN    Culture   Final    NO GROWTH 3 DAYS Performed at [facility], [address] [place], [place] [address]    Report Status 01/29/2017 FINAL  Final    Coagulation Studies: No results for input(s): LABPROT, INR in the last 72 hours.  Urinalysis: No results for input(s): COLORURINE, LABSPEC, PHURINE, GLUCOSEU, HGBUR, BILIRUBINUR, KETONESUR, PROTEINUR, UROBILINOGEN, NITRITE, LEUKOCYTESUR in the last 72 hours.  Invalid input(s): APPERANCEUR    Imaging: Dg Chest 2 View  Result Date: 02/06/2017 CLINICAL DATA:  Shortness of Breath EXAM: CHEST  2 VIEW COMPARISON:  01/25/2017 FINDINGS: Cardiac shadow is stable. Dialysis catheter is again seen in satisfactory position. The left lung remains clear. Persistent and enlarging right pleural effusion is noted. No bony abnormality is seen. IMPRESSION: Persistent right-sided pleural effusion with increase when compared with the prior study. Electronically Signed   By: Alcide Clever M.D.   On: 02/06/2017 09:47   US Paracentesis  Result Date: 02/06/2017 INDICATION: Ascites EXAM: ULTRASOUND GUIDED  PARACENTESIS MEDICATIONS: None.  COMPLICATIONS: None immediate. PROCEDURE: Informed written consent was obtained from the patient after a discussion of the risks, benefits and alternatives to treatment. A timeout was performed prior to the initiation of the procedure. Initial ultrasound scanning demonstrates a moderate amount of ascites within the right lower abdominal quadrant. The right lower abdomen was prepped and draped in the usual sterile fashion. 1% lidocaine with epinephrine was used for  local anesthesia. Following this, a Safe-T-Centesis catheter was introduced. An ultrasound image was saved for documentation purposes. The paracentesis was performed. The catheter was removed and a dressing was applied. The patient tolerated the procedure well without immediate post procedural complication. FINDINGS: A total of approximately 2.9 L of clear yellow fluid was removed. IMPRESSION: Successful ultrasound-guided paracentesis yielding 2.9 liters of peritoneal fluid. Electronically Signed   By: Jolaine ClickArthur  Hoss M.D.   On: 02/06/2017 12:30     Medications:   . cefTRIAXone (ROCEPHIN)  IV Stopped (02/06/17 1606)   . amiodarone  200 mg Oral Daily  . aspirin EC  81 mg Oral Daily  . carvedilol  3.125 mg Oral BID WC  . fluticasone furoate-vilanterol  1 puff Inhalation Daily  . furosemide  80 mg Oral BID  . heparin  5,000 Units Subcutaneous Q8H  . insulin aspart  4 Units Subcutaneous TID WC  . iopamidol  15 mL Oral Q1 Hr x 2  . lisinopril  5 mg Oral Daily  . methimazole  10 mg Oral Daily  . multivitamin  1 tablet Oral QHS  . pantoprazole  40 mg Oral Daily  . spironolactone  25 mg Oral Daily   acetaminophen **OR** acetaminophen, bisacodyl, ondansetron **OR** ondansetron (ZOFRAN) IV, traMADol, zolpidem  Assessment/ Plan:  46 y.o. male with hypertension, diabetes mellitus type 2, chronic kidney disease stage III, peripheral neuropathy  TTHS/[facility]/[name]   1. ESRD on HD TTHS:  Patient wants to defer hemodialysis to tomorrow - plan for hemodialysis for tomorrow. - Electrolytes and volume status are acceptable.  He should be able to wait till tomorrow safely - Vein mapping for access  2. Ascites:  Patient status post paracentesis. 2.9 L removed today.  3. Anemia of chronic kidney disease:  EPO with HD.  4.  Secondary hyperparathyroidism.   Monitor phosphorus with dialysis      LOS: 0 Elridge Stemm 5/8/20185:56 PM

## 2017-02-07 ENCOUNTER — Inpatient Hospital Stay: Payer: BLUE CROSS/BLUE SHIELD

## 2017-02-07 ENCOUNTER — Ambulatory Visit: Payer: BLUE CROSS/BLUE SHIELD

## 2017-02-07 LAB — CBC
HCT: 23.6 % — ABNORMAL LOW (ref 40.0–52.0)
Hemoglobin: 7.4 g/dL — ABNORMAL LOW (ref 13.0–18.0)
MCH: 24.6 pg — ABNORMAL LOW (ref 26.0–34.0)
MCHC: 31.5 g/dL — ABNORMAL LOW (ref 32.0–36.0)
MCV: 78.3 fL — AB (ref 80.0–100.0)
PLATELETS: 278 10*3/uL (ref 150–440)
RBC: 3.01 MIL/uL — ABNORMAL LOW (ref 4.40–5.90)
RDW: 18.6 % — AB (ref 11.5–14.5)
WBC: 6.4 10*3/uL (ref 3.8–10.6)

## 2017-02-07 LAB — GLUCOSE, CAPILLARY
GLUCOSE-CAPILLARY: 126 mg/dL — AB (ref 65–99)
GLUCOSE-CAPILLARY: 128 mg/dL — AB (ref 65–99)
GLUCOSE-CAPILLARY: 157 mg/dL — AB (ref 65–99)
Glucose-Capillary: 126 mg/dL — ABNORMAL HIGH (ref 65–99)

## 2017-02-07 LAB — PROTIME-INR
INR: 1.54
Prothrombin Time: 18.6 seconds — ABNORMAL HIGH (ref 11.4–15.2)

## 2017-02-07 MED ORDER — CEFTAZIDIME AND DEXTROSE 2 GM/50ML IV SOLR
2.0000 g | INTRAVENOUS | Status: DC
  Filled 2017-02-07 (×2): qty 50

## 2017-02-07 MED ORDER — PROCHLORPERAZINE EDISYLATE 5 MG/ML IJ SOLN
10.0000 mg | Freq: Four times a day (QID) | INTRAMUSCULAR | Status: DC | PRN
Start: 2017-02-07 — End: 2017-02-09
  Filled 2017-02-07: qty 2

## 2017-02-07 MED ORDER — SODIUM CHLORIDE 0.9 % IV BOLUS (SEPSIS): 250.0000 mL | Freq: Once | INTRAVENOUS | Status: DC

## 2017-02-07 MED ORDER — MIDODRINE HCL 5 MG PO TABS: 10.0000 mg | ORAL_TABLET | Freq: Once | ORAL | Status: DC

## 2017-02-07 MED ORDER — PSEUDOEPHEDRINE HCL ER 120 MG PO TB12
120.0000 mg | ORAL_TABLET | Freq: Two times a day (BID) | ORAL | Status: DC
  Administered 2017-02-07 – 2017-02-09 (×5): 120 mg via ORAL
  Filled 2017-02-07 (×6): qty 1

## 2017-02-07 NOTE — Progress Notes (Signed)
[employer] - [place] at [facility]   PATIENT NAME: Juan Roberson    MR#:  [id number]  DATE OF BIRTH:  1971-07-06  SUBJECTIVE:   Patient here due to abdominal distention and shortness of breath and noted to have ascites and is status post paracentesis yesterday with 2.9 L of fluid removed. Fluid analysis was consistent with spontaneous bacteria peritonitis. Patient having some persistent nausea and vomiting this morning. No abdominal pain, shortness of breath improved.  REVIEW OF SYSTEMS:    Review of Systems  Constitutional: Negative for chills and fever.  HENT: Negative for congestion and tinnitus.   Eyes: Negative for blurred vision and double vision.  Respiratory: Negative for cough, shortness of breath and wheezing.   Cardiovascular: Negative for chest pain, orthopnea and PND.  Gastrointestinal: Positive for nausea. Negative for abdominal pain, diarrhea and vomiting.  Genitourinary: Negative for dysuria and hematuria.  Neurological: Negative for dizziness, sensory change and focal weakness.  All other systems reviewed and are negative.   Nutrition: Renal diet Tolerating Diet: Yes Tolerating PT: Ambulatory  DRUG ALLERGIES:   Allergies  Allergen Reactions  . Sulfur Other (See Comments)    Pt states that this medication causes renal failure.      VITALS:  Blood pressure (!) 70/51, pulse 83, temperature 98.2 F (36.8 C), temperature source Oral, resp. rate 18, height 6\' 2"  (1.88 m), weight 86.9 kg (191 lb 9.3 oz), SpO2 100 %.  PHYSICAL EXAMINATION:   Physical Exam  GENERAL:  46 y.o.-year-old patient lying in bed in no acute distress.  EYES: Pupils equal, round, reactive to light and accommodation. No scleral icterus. Extraocular muscles intact.  HEENT: Head atraumatic, normocephalic. Oropharynx and nasopharynx clear.  NECK:  Supple, no jugular venous distention. No thyroid enlargement, no tenderness.  LUNGS: Normal breath sounds bilaterally, no  wheezing, rales, rhonchi. No use of accessory muscles of respiration.  CARDIOVASCULAR: S1, S2 normal. No murmurs, rubs, or gallops.  ABDOMEN: Soft, nontender, slightly distended. Bowel sounds present. No organomegaly or mass.  EXTREMITIES: No cyanosis, clubbing or edema b/l.    NEUROLOGIC: Cranial nerves II through XII are intact. No focal Motor or sensory deficits b/l.   PSYCHIATRIC: The patient is alert and oriented x 3.  SKIN: No obvious rash, lesion, or ulcer.    LABORATORY PANEL:   CBC  Recent Labs Lab 02/07/17 0404  WBC 6.4  HGB 7.4*  HCT 23.6*  PLT 278   ------------------------------------------------------------------------------------------------------------------  Chemistries   Recent Labs Lab 02/06/17 0920  NA 138  K 4.3  CL 99*  CO2 30  GLUCOSE 128*  BUN 51*  CREATININE 5.78*  CALCIUM 7.9*   ------------------------------------------------------------------------------------------------------------------  Cardiac Enzymes  Recent Labs Lab 02/06/17 0920  TROPONINI 0.03*   ------------------------------------------------------------------------------------------------------------------  RADIOLOGY:  Ct Abdomen Pelvis Wo Contrast  Result Date: 02/06/2017 CLINICAL DATA:  Acute onset of generalized abdominal distention and shortness of breath. Status post recent paracentesis. Concern for spontaneous bacterial peritonitis. Initial encounter. EXAM: CT ABDOMEN AND PELVIS WITHOUT CONTRAST TECHNIQUE: Multidetector CT imaging of the abdomen and pelvis was performed following the standard protocol without IV contrast. COMPARISON:  CT of the abdomen and pelvis performed 01/09/2017 FINDINGS: Lower chest: There is a large right-sided pleural effusion, occupying nearly the entirety of the visualized right hemithorax. There is underlying partial consolidation of the right lung. The visualized portions of the left lung are grossly clear. Diffuse coronary artery  calcifications are seen. The heart is mildly enlarged. A central line is noted ending about  the proximal right atrium. Hepatobiliary: The mildly nodular contour of the liver raises concern for mild hepatic cirrhosis. The gallbladder is grossly unremarkable in appearance. The common bile duct remains normal in caliber. Pancreas: The pancreas is within normal limits. Spleen: The spleen is unremarkable in appearance. Adrenals/Urinary Tract: The adrenal glands are unremarkable in appearance. The kidneys are within normal limits. There is no evidence of hydronephrosis. No renal or ureteral stones are identified. No perinephric stranding is seen. Stomach/Bowel: The stomach is unremarkable in appearance. The small bowel is within normal limits. The appendix is normal in caliber, without evidence of appendicitis. The colon is unremarkable in appearance. Vascular/Lymphatic: Scattered calcification is seen along the abdominal aorta and its branches. The abdominal aorta is otherwise grossly unremarkable. The inferior vena cava is grossly unremarkable. No retroperitoneal lymphadenopathy is seen. No pelvic sidewall lymphadenopathy is identified. Reproductive: The bladder is mildly distended and grossly unremarkable. The prostate remains normal in size. Other: Small to moderate volume ascites is seen within the abdomen and pelvis, with surrounding soft tissue inflammation. Mild diffuse mesenteric inflammation is noted at the upper abdomen, of uncertain significance. Musculoskeletal: No acute osseous abnormalities are identified. Multilevel vacuum phenomenon is noted along the lumbar spine, with underlying facet disease. The visualized musculature is unremarkable in appearance. IMPRESSION: 1. Small to moderate volume ascites within the abdomen and pelvis, with surrounding soft tissue inflammation. Mild diffuse mesenteric inflammation at the upper abdomen. Would correlate for clinical evidence of peritonitis. 2. Large right-sided  pleural effusion, occupying nearly the entirety of the visualized right hemithorax, with partial consolidation of the right lung. 3. Scattered aortic atherosclerosis. 4. Mild cardiomegaly.  Diffuse coronary artery calcifications seen. 5. Mildly nodular contour of the liver raises concern for mild hepatic cirrhosis. 6. Mild degenerative change along the lumbar spine. Electronically Signed   By: Roanna Raider M.D.   On: 02/06/2017 20:36   Dg Chest 2 View  Result Date: 02/07/2017 CLINICAL DATA:  Shortness of breath. EXAM: CHEST  2 VIEW COMPARISON:  Chest radiograph yesterday at 0925 hours FINDINGS: Left internal jugular dialysis catheter tip remains in the distal SVC. Large right pleural effusion with only minimal aerated right perihilar lung, questionable progression from prior radiograph. Left lung is clear. Mediastinal contours are obscured secondary to right hemithorax calcification. No pneumothorax. IMPRESSION: Large right pleural effusion with only minimal aerated right perihilar lung. Possible increase in pleural fluid from chest radiograph yesterday. Electronically Signed   By: Rubye Oaks M.D.   On: 02/07/2017 01:23   Dg Chest 2 View  Result Date: 02/06/2017 CLINICAL DATA:  Shortness of Breath EXAM: CHEST  2 VIEW COMPARISON:  01/25/2017 FINDINGS: Cardiac shadow is stable. Dialysis catheter is again seen in satisfactory position. The left lung remains clear. Persistent and enlarging right pleural effusion is noted. No bony abnormality is seen. IMPRESSION: Persistent right-sided pleural effusion with increase when compared with the prior study. Electronically Signed   By: Alcide Clever M.D.   On: 02/06/2017 09:47   US Paracentesis  Result Date: 02/06/2017 INDICATION: Ascites EXAM: ULTRASOUND GUIDED  PARACENTESIS MEDICATIONS: None. COMPLICATIONS: None immediate. PROCEDURE: Informed written consent was obtained from the patient after a discussion of the risks, benefits and alternatives to treatment.  A timeout was performed prior to the initiation of the procedure. Initial ultrasound scanning demonstrates a moderate amount of ascites within the right lower abdominal quadrant. The right lower abdomen was prepped and draped in the usual sterile fashion. 1% lidocaine with epinephrine was used for local anesthesia.  Following this, a Safe-T-Centesis catheter was introduced. An ultrasound image was saved for documentation purposes. The paracentesis was performed. The catheter was removed and a dressing was applied. The patient tolerated the procedure well without immediate post procedural complication. FINDINGS: A total of approximately 2.9 L of clear yellow fluid was removed. IMPRESSION: Successful ultrasound-guided paracentesis yielding 2.9 liters of peritoneal fluid. Electronically Signed   By: Jolaine ClickArthur  Hoss M.D.   On: 02/06/2017 12:30     ASSESSMENT AND PLAN:   46 year old male with past medical history of end-stage renal disease on hemodialysis, chronic systolic CHF, ischemic cardiomyopathy ejection fraction of 10%, history of osteomyelitis, previous history of MI, GERD presents to the hospital due to abdominal distention shortness of breath.  1. Spontaneous bacterial peritonitis-patient underwent a ultrasound-guided paracentesis for his abdominal distention yesterday and ascites and his fluid analysis was consistent with SBP. -Patient was already on oral ciprofloxacin but despite that he has greater than 500 white cells in his peritoneal fluid. Clinically he is asymptomatic with no abdominal pain, afebrile with a normal white cell count. -Patient has been seen by gastroenterology and also infectious disease. Underwent a CT scan of the abdomen and pelvis which showed some mesenteric inflammation but no other acute pathology. -As per ID continue IV ceftriaxone, and may need to transition to something else orally prior to discharge.  2. End-stage renal disease on hemodialysis- -Nephrology has been  consulted, patient gets dialysis on Tuesday Thursday Saturday. He will continue that schedule.  3. History of diabetes type 2 without complication-continue NovoLog with meals. - BS Stable.   4. Essential hypertension- BP meds on hold due to hypotension.   5. Hypotension - etiology unclear.  Asymptomatic.  - hold BP meds.  ?? Related to SBP and volume loss and paracentesis. Consider giving albumin. Given IV fluid bolus and will monitor. Also consider adding Midodrine but await Nephro input.   6. Hyperthyroidism - cont. methimazole.  7. GERD-continue Protonix.     All the records are reviewed and case discussed with Care Management/Social Worker. Management plans discussed with the patient, family and they are in agreement.  CODE STATUS: Full code  DVT Prophylaxis: Hep SQ  TOTAL TIME TAKING CARE OF THIS PATIENT: 30 minutes.   POSSIBLE D/C IN 1-2 DAYS, DEPENDING ON CLINICAL CONDITION.   [place] SirenSAINANI,VIVEK J M.D on 02/07/2017 at 2:39 PM  Between 7am to 6pm - Pager - [phone]  After 6pm go to [email or web address] - Social research officer, governmentpassword [email or web address]  [facility]  Office  [phone]  CC: Primary care physician; Corky DownsMasoud, Javed, MD

## 2017-02-07 NOTE — Progress Notes (Signed)
Pt reported increasing nausea with mild emesis. Zofran given with minimum relief. Pt reports SOB with O2 sat's in the 70's and sudden inability to hear. Pt states "the TV sounds like the speaker is busted" 2L Chambers applied and O2 sat's increased to 92. MD Anne HahnWillis made aware and was concerned pt may have aspirated.  STAT chest xray ordered.

## 2017-02-07 NOTE — Progress Notes (Addendum)
[facility]  ROUNDING NOTE   Subjective:   Patient reports abdominal pain No fevers or chills.  Denies any nausea or vomiting.  No shortness of breath or chest pain Dialysis was delayed this morning because of low blood pressure Patient will get some IV fluid during dialysis Report decreased hearing due to simus congestion  Objective:  Vital signs in last 24 hours:  Temp:  [98 F (36.7 C)-98.9 F (37.2 C)] 98.2 F (36.8 C) (05/09 1415) Pulse Rate:  [70-98] 80 (05/09 1500) Resp:  [12-18] 14 (05/09 1500) BP: (70-100)/(41-61) 71/49 (05/09 1500) SpO2:  [90 %-100 %] 100 % (05/09 1500) Weight:  [86.9 kg (191 lb 9.3 oz)] 86.9 kg (191 lb 9.3 oz) (05/09 1415)  Weight change:  Filed Weights   02/06/17 0825 02/07/17 1415  Weight: 80.7 kg (178 lb) 86.9 kg (191 lb 9.3 oz)    Intake/Output: No intake/output data recorded.   Intake/Output this shift:  Total I/O In: 720 [P.O.:720] Out: -   Physical Exam: General: No acute distress  Head: Normocephalic, atraumatic. Moist oral mucosal membranes  Eyes: Anicteric  Neck: Supple, trachea midline  Lungs:  diminised at bases, normal effort  Heart: S1S2 no rubs  Abdomen:  Soft, decreased distension,+ tenderness  Extremities: 1+ peripheral edema. b/l   Neurologic: Awake, alert, following commands  Skin: No lesions  Access: IJ permcath    Basic Metabolic Panel:  Recent Labs Lab 02/06/17 0920  NA 138  K 4.3  CL 99*  CO2 30  GLUCOSE 128*  BUN 51*  CREATININE 5.78*  CALCIUM 7.9*    Liver Function Tests: No results for input(s): AST, ALT, ALKPHOS, BILITOT, PROT, ALBUMIN in the last 168 hours. No results for input(s): LIPASE, AMYLASE in the last 168 hours. No results for input(s): AMMONIA in the last 168 hours.  CBC:  Recent Labs Lab 02/06/17 0920 02/07/17 0404  WBC 5.6 6.4  NEUTROABS 4.0  --   HGB 8.7* 7.4*  HCT 27.9* 23.6*  MCV 78.8* 78.3*  PLT 270 278    Cardiac Enzymes:  Recent Labs Lab  02/06/17 0920  TROPONINI 0.03*    BNP: Invalid input(s): POCBNP  CBG:  Recent Labs Lab 02/06/17 1745 02/06/17 2044 02/07/17 0036 02/07/17 0733 02/07/17 1143  GLUCAP 151* 135* 157* 126* 126*    Microbiology: Results for orders placed or performed during the hospital encounter of 02/06/17  Body fluid culture     Status: None (Preliminary result)   Collection Time: 02/06/17 11:11 AM  Result Value Ref Range Status   Specimen Description PERITONEAL  Final   Special Requests NONE  Final   Gram Stain   Final    FEW WBC PRESENT,BOTH PMN AND MONONUCLEAR NO ORGANISMS SEEN    Culture   Final    NO GROWTH < 24 HOURS Performed at [facility], [address], [place], [place] [address]    Report Status PENDING  Incomplete    Coagulation Studies:  Recent Labs  02/07/17 0404  LABPROT 18.6*  INR 1.54    Urinalysis: No results for input(s): COLORURINE, LABSPEC, PHURINE, GLUCOSEU, HGBUR, BILIRUBINUR, KETONESUR, PROTEINUR, UROBILINOGEN, NITRITE, LEUKOCYTESUR in the last 72 hours.  Invalid input(s): APPERANCEUR    Imaging: Ct Abdomen Pelvis Wo Contrast  Result Date: 02/06/2017 CLINICAL DATA:  Acute onset of generalized abdominal distention and shortness of breath. Status post recent paracentesis. Concern for spontaneous bacterial peritonitis. Initial encounter. EXAM: CT ABDOMEN AND PELVIS WITHOUT CONTRAST TECHNIQUE: Multidetector CT imaging of the  abdomen and pelvis was performed following the standard protocol without IV contrast. COMPARISON:  CT of the abdomen and pelvis performed 01/09/2017 FINDINGS: Lower chest: There is a large right-sided pleural effusion, occupying nearly the entirety of the visualized right hemithorax. There is underlying partial consolidation of the right lung. The visualized portions of the left lung are grossly clear. Diffuse coronary artery calcifications are seen. The heart is mildly enlarged. A central line is noted ending about the proximal  right atrium. Hepatobiliary: The mildly nodular contour of the liver raises concern for mild hepatic cirrhosis. The gallbladder is grossly unremarkable in appearance. The common bile duct remains normal in caliber. Pancreas: The pancreas is within normal limits. Spleen: The spleen is unremarkable in appearance. Adrenals/Urinary Tract: The adrenal glands are unremarkable in appearance. The kidneys are within normal limits. There is no evidence of hydronephrosis. No renal or ureteral stones are identified. No perinephric stranding is seen. Stomach/Bowel: The stomach is unremarkable in appearance. The small bowel is within normal limits. The appendix is normal in caliber, without evidence of appendicitis. The colon is unremarkable in appearance. Vascular/Lymphatic: Scattered calcification is seen along the abdominal aorta and its branches. The abdominal aorta is otherwise grossly unremarkable. The inferior vena cava is grossly unremarkable. No retroperitoneal lymphadenopathy is seen. No pelvic sidewall lymphadenopathy is identified. Reproductive: The bladder is mildly distended and grossly unremarkable. The prostate remains normal in size. Other: Small to moderate volume ascites is seen within the abdomen and pelvis, with surrounding soft tissue inflammation. Mild diffuse mesenteric inflammation is noted at the upper abdomen, of uncertain significance. Musculoskeletal: No acute osseous abnormalities are identified. Multilevel vacuum phenomenon is noted along the lumbar spine, with underlying facet disease. The visualized musculature is unremarkable in appearance. IMPRESSION: 1. Small to moderate volume ascites within the abdomen and pelvis, with surrounding soft tissue inflammation. Mild diffuse mesenteric inflammation at the upper abdomen. Would correlate for clinical evidence of peritonitis. 2. Large right-sided pleural effusion, occupying nearly the entirety of the visualized right hemithorax, with partial  consolidation of the right lung. 3. Scattered aortic atherosclerosis. 4. Mild cardiomegaly.  Diffuse coronary artery calcifications seen. 5. Mildly nodular contour of the liver raises concern for mild hepatic cirrhosis. 6. Mild degenerative change along the lumbar spine. Electronically Signed   By: Roanna RaiderJeffery  Chang M.D.   On: 02/06/2017 20:36   Dg Chest 2 View  Result Date: 02/07/2017 CLINICAL DATA:  Shortness of breath. EXAM: CHEST  2 VIEW COMPARISON:  Chest radiograph yesterday at 0925 hours FINDINGS: Left internal jugular dialysis catheter tip remains in the distal SVC. Large right pleural effusion with only minimal aerated right perihilar lung, questionable progression from prior radiograph. Left lung is clear. Mediastinal contours are obscured secondary to right hemithorax calcification. No pneumothorax. IMPRESSION: Large right pleural effusion with only minimal aerated right perihilar lung. Possible increase in pleural fluid from chest radiograph yesterday. Electronically Signed   By: Rubye OaksMelanie  Ehinger M.D.   On: 02/07/2017 01:23   Dg Chest 2 View  Result Date: 02/06/2017 CLINICAL DATA:  Shortness of Breath EXAM: CHEST  2 VIEW COMPARISON:  01/25/2017 FINDINGS: Cardiac shadow is stable. Dialysis catheter is again seen in satisfactory position. The left lung remains clear. Persistent and enlarging right pleural effusion is noted. No bony abnormality is seen. IMPRESSION: Persistent right-sided pleural effusion with increase when compared with the prior study. Electronically Signed   By: Alcide CleverMark  Lukens M.D.   On: 02/06/2017 09:47   [place] Paracentesis  Result Date: 02/06/2017 INDICATION: Ascites  EXAM: ULTRASOUND GUIDED  PARACENTESIS MEDICATIONS: None. COMPLICATIONS: None immediate. PROCEDURE: Informed written consent was obtained from the patient after a discussion of the risks, benefits and alternatives to treatment. A timeout was performed prior to the initiation of the procedure. Initial ultrasound scanning  demonstrates a moderate amount of ascites within the right lower abdominal quadrant. The right lower abdomen was prepped and draped in the usual sterile fashion. 1% lidocaine with epinephrine was used for local anesthesia. Following this, a Safe-T-Centesis catheter was introduced. An ultrasound image was saved for documentation purposes. The paracentesis was performed. The catheter was removed and a dressing was applied. The patient tolerated the procedure well without immediate post procedural complication. FINDINGS: A total of approximately 2.9 L of clear yellow fluid was removed. IMPRESSION: Successful ultrasound-guided paracentesis yielding 2.9 liters of peritoneal fluid. Electronically Signed   By: Jolaine Click M.D.   On: 02/06/2017 12:30     Medications:   . cefTRIAXone (ROCEPHIN)  IV Stopped (02/06/17 1606)  . sodium chloride     . amiodarone  200 mg Oral Daily  . aspirin EC  81 mg Oral Daily  . epoetin (EPOGEN/PROCRIT) injection  4,000 Units Intravenous Once  . fluticasone furoate-vilanterol  1 puff Inhalation Daily  . heparin  5,000 Units Subcutaneous Q8H  . insulin aspart  4 Units Subcutaneous TID WC  . methimazole  10 mg Oral Daily  . multivitamin  1 tablet Oral QHS  . pantoprazole  40 mg Oral Daily  . pseudoephedrine  120 mg Oral BID   acetaminophen **OR** acetaminophen, bisacodyl, ondansetron **OR** ondansetron (ZOFRAN) IV, prochlorperazine, traMADol, zolpidem  Assessment/ Plan:  46 y.o. male with hypertension, diabetes mellitus type 2, chronic kidney disease stage III, peripheral neuropathy  [facility]THS/[facility]/[name]   1. ESRD on HD TTHS:  - plan for hemodialysis today then resume regular schedule - Vein mapping for access  2. Ascites:  Patient status post paracentesis. 2.9 L removed 02/06/17  3. Anemia of chronic kidney disease:  EPO with HD.  4.  Secondary hyperparathyroidism.   Monitor phosphorus with dialysis   5. Hypotension  - fluid bolus with HD - Midodrine  PRN  6. SBP Currently treated with IV Rocephin     LOS: 1 Juan Roberson 5/9/20183:32 PM

## 2017-02-07 NOTE — Progress Notes (Signed)
Hbg reported at 7.4. MD Diamond made aware. No interventions at this time. Will continue to monitor. 

## 2017-02-07 NOTE — Progress Notes (Signed)
[facility] CLINIC INFECTIOUS DISEASE PROGRESS NOTE Date of Admission:  02/06/2017     ID: Juan Blossom. is a 46 y.o. male with  ascites Active Problems:   SBP (spontaneous bacterial peritonitis) (HCC)   Subjective: No fevers  ROS  Eleven systems are reviewed and negative except per hpi  Medications:  Antibiotics Given (last 72 hours)    Date/Time Action Medication Dose Rate   02/06/17 1536 New Bag/Given   cefTRIAXone (ROCEPHIN) 2 g in dextrose 5 % 50 mL IVPB 2 g 100 mL/hr     . amiodarone  200 mg Oral Daily  . aspirin EC  81 mg Oral Daily  . epoetin (EPOGEN/PROCRIT) injection  4,000 Units Intravenous Once  . fluticasone furoate-vilanterol  1 puff Inhalation Daily  . heparin  5,000 Units Subcutaneous Q8H  . insulin aspart  4 Units Subcutaneous TID WC  . methimazole  10 mg Oral Daily  . midodrine  10 mg Oral Once in dialysis  . multivitamin  1 tablet Oral QHS  . pantoprazole  40 mg Oral Daily  . pseudoephedrine  120 mg Oral BID    Objective: Vital signs in last 24 hours: Temp:  [98 F (36.7 C)-98.9 F (37.2 C)] 98.2 F (36.8 C) (05/09 1415) Pulse Rate:  [70-98] 80 (05/09 1500) Resp:  [12-18] 14 (05/09 1500) BP: (70-100)/(41-61) 71/49 (05/09 1500) SpO2:  [90 %-100 %] 100 % (05/09 1500) Weight:  [86.9 kg (191 lb 9.3 oz)] 86.9 kg (191 lb 9.3 oz) (05/09 1415) Constitutional: He is oriented to person, place, and time. disheveled HENT: anicteric Mouth/Throat: Oropharynx is clear and dry. No oropharyngeal exudate.  Cardiovascular: Normal rate, regular rhythm -distant  Pulmonary/Chest:decreased bs bil Abdominal: Soft. Mod distention but not taut, mild diffuse ttp  Lymphadenopathy: He has no cervical adenopathy.  Ext 1+ edema HD cath R chest wall wnl Neurological: He is alert and oriented to person, place, and time.  Skin: Skin is warm and dry. No rash noted. No erythema.  Psychiatric: He has a normal mood and affect. His behavior is normal.  Lab Results  Recent  Labs  02/06/17 0920 02/07/17 0404  WBC 5.6 6.4  HGB 8.7* 7.4*  HCT 27.9* 23.6*  NA 138  --   K 4.3  --   CL 99*  --   CO2 30  --   BUN 51*  --   CREATININE 5.78*  --     Microbiology: Results for orders placed or performed during the hospital encounter of 02/06/17  Body fluid culture     Status: None (Preliminary result)   Collection Time: 02/06/17 11:11 AM  Result Value Ref Range Status   Specimen Description PERITONEAL  Final   Special Requests NONE  Final   Gram Stain   Final    FEW WBC PRESENT,BOTH PMN AND MONONUCLEAR NO ORGANISMS SEEN    Culture   Final    NO GROWTH < 24 HOURS Performed at [facility], [address], [place], [place] [address]    Report Status PENDING  Incomplete    Studies/Results: Ct Abdomen Pelvis Wo Contrast  Result Date: 02/06/2017 CLINICAL DATA:  Acute onset of generalized abdominal distention and shortness of breath. Status post recent paracentesis. Concern for spontaneous bacterial peritonitis. Initial encounter. EXAM: CT ABDOMEN AND PELVIS WITHOUT CONTRAST TECHNIQUE: Multidetector CT imaging of the abdomen and pelvis was performed following the standard protocol without IV contrast. COMPARISON:  CT of the abdomen and pelvis performed 01/09/2017 FINDINGS: Lower chest:  There is a large right-sided pleural effusion, occupying nearly the entirety of the visualized right hemithorax. There is underlying partial consolidation of the right lung. The visualized portions of the left lung are grossly clear. Diffuse coronary artery calcifications are seen. The heart is mildly enlarged. A central line is noted ending about the proximal right atrium. Hepatobiliary: The mildly nodular contour of the liver raises concern for mild hepatic cirrhosis. The gallbladder is grossly unremarkable in appearance. The common bile duct remains normal in caliber. Pancreas: The pancreas is within normal limits. Spleen: The spleen is unremarkable in appearance.  Adrenals/Urinary Tract: The adrenal glands are unremarkable in appearance. The kidneys are within normal limits. There is no evidence of hydronephrosis. No renal or ureteral stones are identified. No perinephric stranding is seen. Stomach/Bowel: The stomach is unremarkable in appearance. The small bowel is within normal limits. The appendix is normal in caliber, without evidence of appendicitis. The colon is unremarkable in appearance. Vascular/Lymphatic: Scattered calcification is seen along the abdominal aorta and its branches. The abdominal aorta is otherwise grossly unremarkable. The inferior vena cava is grossly unremarkable. No retroperitoneal lymphadenopathy is seen. No pelvic sidewall lymphadenopathy is identified. Reproductive: The bladder is mildly distended and grossly unremarkable. The prostate remains normal in size. Other: Small to moderate volume ascites is seen within the abdomen and pelvis, with surrounding soft tissue inflammation. Mild diffuse mesenteric inflammation is noted at the upper abdomen, of uncertain significance. Musculoskeletal: No acute osseous abnormalities are identified. Multilevel vacuum phenomenon is noted along the lumbar spine, with underlying facet disease. The visualized musculature is unremarkable in appearance. IMPRESSION: 1. Small to moderate volume ascites within the abdomen and pelvis, with surrounding soft tissue inflammation. Mild diffuse mesenteric inflammation at the upper abdomen. Would correlate for clinical evidence of peritonitis. 2. Large right-sided pleural effusion, occupying nearly the entirety of the visualized right hemithorax, with partial consolidation of the right lung. 3. Scattered aortic atherosclerosis. 4. Mild cardiomegaly.  Diffuse coronary artery calcifications seen. 5. Mildly nodular contour of the liver raises concern for mild hepatic cirrhosis. 6. Mild degenerative change along the lumbar spine. Electronically Signed   By: Roanna RaiderJeffery  Chang M.D.    On: 02/06/2017 20:36   Dg Chest 2 View  Result Date: 02/07/2017 CLINICAL DATA:  Shortness of breath. EXAM: CHEST  2 VIEW COMPARISON:  Chest radiograph yesterday at 0925 hours FINDINGS: Left internal jugular dialysis catheter tip remains in the distal SVC. Large right pleural effusion with only minimal aerated right perihilar lung, questionable progression from prior radiograph. Left lung is clear. Mediastinal contours are obscured secondary to right hemithorax calcification. No pneumothorax. IMPRESSION: Large right pleural effusion with only minimal aerated right perihilar lung. Possible increase in pleural fluid from chest radiograph yesterday. Electronically Signed   By: Rubye OaksMelanie  Ehinger M.D.   On: 02/07/2017 01:23   Dg Chest 2 View  Result Date: 02/06/2017 CLINICAL DATA:  Shortness of Breath EXAM: CHEST  2 VIEW COMPARISON:  01/25/2017 FINDINGS: Cardiac shadow is stable. Dialysis catheter is again seen in satisfactory position. The left lung remains clear. Persistent and enlarging right pleural effusion is noted. No bony abnormality is seen. IMPRESSION: Persistent right-sided pleural effusion with increase when compared with the prior study. Electronically Signed   By: Alcide CleverMark  Lukens M.D.   On: 02/06/2017 09:47   [place] Paracentesis  Result Date: 02/06/2017 INDICATION: Ascites EXAM: ULTRASOUND GUIDED  PARACENTESIS MEDICATIONS: None. COMPLICATIONS: None immediate. PROCEDURE: Informed written consent was obtained from the patient after a discussion of the risks,  benefits and alternatives to treatment. A timeout was performed prior to the initiation of the procedure. Initial ultrasound scanning demonstrates a moderate amount of ascites within the right lower abdominal quadrant. The right lower abdomen was prepped and draped in the usual sterile fashion. 1% lidocaine with epinephrine was used for local anesthesia. Following this, a Safe-T-Centesis catheter was introduced. An ultrasound image was saved for  documentation purposes. The paracentesis was performed. The catheter was removed and a dressing was applied. The patient tolerated the procedure well without immediate post procedural complication. FINDINGS: A total of approximately 2.9 L of clear yellow fluid was removed. IMPRESSION: Successful ultrasound-guided paracentesis yielding 2.9 liters of peritoneal fluid. Electronically Signed   By: Jolaine Click M.D.   On: 02/06/2017 12:30    Assessment/Plan: Juan Foti. is a 46 y.o. male with ESRD,  DM, ? Cardiac cirrhosis, prior MRSA knee infection admitted with recurrent Sob, abd distention, recurrent ascites and increasing pleural effusion on R.  He is HIV, Hep B and C negative.  He had CT abd done 4/10 with no evidence of infection. All cultures have been negative. He has been on ciprofloxacin as otpt. Bactrim allergic.  I am on convinced he has currently SBP as PMN count in ascitic fluid is <250 and all cultures have been negative. He also has no fevers, or leukocytosis. However CT does show some inflammatory stranding.  At this point could try to see if he has improvement with his sxs with IV ceftazidime to be given at HD for 10 days if culture remain negative  Recommendations Change ctx to HD dosed ceftazidime Continue for 10 day course.  Thank you very much for the consult. Will follow with you.  Juan Roberson   02/07/2017, 3:41 PM

## 2017-02-07 NOTE — Progress Notes (Signed)
Pharmacy Antibiotic Note  Queen BlossomWaltzie L Schiano Jr. is a 46 y.o. male admitted on 02/06/2017 with SBP.  Pharmacy has been consulted for ceftazidime dosing.  Plan: Ceftazidime 2g IV after HD on T/Th/Sat  Height: 6\' 2"  (188 cm) Weight: 191 lb 9.3 oz (86.9 kg) IBW/kg (Calculated) : 82.2  Temp (24hrs), Avg:98.4 F (36.9 C), Min:98 F (36.7 C), Max:98.9 F (37.2 C)   Recent Labs Lab 02/06/17 0920 02/07/17 0404  WBC 5.6 6.4  CREATININE 5.78*  --     Estimated Creatinine Clearance: 18.4 mL/min (A) (by C-G formula based on SCr of 5.78 mg/dL (H)).    Allergies  Allergen Reactions  . Sulfur Other (See Comments)    Pt states that this medication causes renal failure.      Antimicrobials this admission: CTX 5/8 >>5/9  Microbiology results: 5/8 body fluid: NGTD  Thank you for allowing pharmacy to be a part of this patient's care.  Delsa BernKelly m Erie Sica, PharmD 02/07/2017 4:46 PM

## 2017-02-08 ENCOUNTER — Inpatient Hospital Stay: Payer: BLUE CROSS/BLUE SHIELD

## 2017-02-08 LAB — RENAL FUNCTION PANEL
ALBUMIN: 1.7 g/dL — AB (ref 3.5–5.0)
ANION GAP: 9 (ref 5–15)
BUN: 59 mg/dL — AB (ref 6–20)
CO2: 27 mmol/L (ref 22–32)
Calcium: 7.5 mg/dL — ABNORMAL LOW (ref 8.9–10.3)
Chloride: 97 mmol/L — ABNORMAL LOW (ref 101–111)
Creatinine, Ser: 6.62 mg/dL — ABNORMAL HIGH (ref 0.61–1.24)
GFR calc Af Amer: 10 mL/min — ABNORMAL LOW (ref >60)
GFR calc non Af Amer: 9 mL/min — ABNORMAL LOW (ref >60)
GLUCOSE: 179 mg/dL — AB (ref 65–99)
PHOSPHORUS: 8.9 mg/dL — AB (ref 2.5–4.6)
POTASSIUM: 4.8 mmol/L (ref 3.5–5.1)
Sodium: 133 mmol/L — ABNORMAL LOW (ref 135–145)

## 2017-02-08 LAB — GLUCOSE, CAPILLARY
GLUCOSE-CAPILLARY: 151 mg/dL — AB (ref 65–99)
Glucose-Capillary: 151 mg/dL — ABNORMAL HIGH (ref 65–99)
Glucose-Capillary: 125 mg/dL — ABNORMAL HIGH (ref 65–99)

## 2017-02-08 LAB — BODY FLUID CELL COUNT WITH DIFFERENTIAL
Eos, Fluid: 0 %
LYMPHS FL: 1 %
MONOCYTE-MACROPHAGE-SEROUS FLUID: 93 %
NEUTROPHIL FLUID: 6 %
Other Cells, Fluid: 0 %
Total Nucleated Cell Count, Fluid: 1038 cu mm

## 2017-02-08 LAB — AMYLASE, PLEURAL OR PERITONEAL FLUID: Amylase, Fluid: 14 U/L

## 2017-02-08 LAB — GRAM STAIN

## 2017-02-08 LAB — HEMOGLOBIN AND HEMATOCRIT, BLOOD
HEMATOCRIT: 22.4 % — AB (ref 40.0–52.0)
HEMOGLOBIN: 7.1 g/dL — AB (ref 13.0–18.0)

## 2017-02-08 LAB — PROTEIN, PLEURAL OR PERITONEAL FLUID: Total protein, fluid: 3.2 g/dL

## 2017-02-08 LAB — LACTATE DEHYDROGENASE, PLEURAL OR PERITONEAL FLUID: LD FL: 60 U/L — AB (ref 3–23)

## 2017-02-08 NOTE — Progress Notes (Signed)
[facility]ESS [facility] Date of Admission:  02/06/2017     ID: Juan BlossomWaltzie L Weil Jr. is a 46 y.o. male with  ascites Active Problems:   SBP (spontaneous bacterial peritonitis) (HCC)   Subjective: No fevers and he says pain in abd much better. He is eating a sandwich.  ROS  Eleven systems are reviewed and negative except per hpi  Medications:  Antibiotics Given (last 72 hours)    Date/Time Action Medication Dose Rate   02/06/17 1536 New Bag/Given   cefTRIAXone (ROCEPHIN) 2 g in dextrose 5 % 50 mL IVPB 2 g 100 mL/hr     . amiodarone  200 mg Oral Daily  . aspirin EC  81 mg Oral Daily  . fluticasone furoate-vilanterol  1 puff Inhalation Daily  . heparin  5,000 Units Subcutaneous Q8H  . insulin aspart  4 Units Subcutaneous TID WC  . methimazole  10 mg Oral Daily  . midodrine  10 mg Oral Once in dialysis  . multivitamin  1 tablet Oral QHS  . pantoprazole  40 mg Oral Daily  . pseudoephedrine  120 mg Oral BID    Objective: Vital signs in last 24 hours: Temp:  [97.6 F (36.4 C)-98.6 F (37 C)] 97.9 F (36.6 C) (05/10 1400) Pulse Rate:  [73-98] 91 (05/10 1400) Resp:  [13-20] 20 (05/10 1400) BP: (67-101)/(42-71) 101/68 (05/10 1400) SpO2:  [93 %-100 %] 94 % (05/10 1035) Weight:  [87.6 kg (193 lb 3.2 oz)-89.1 kg (196 lb 6.9 oz)] 89 kg (196 lb 3.4 oz) (05/10 1400) Constitutional: He is oriented to person, place, and time. disheveled HENT: anicteric Mouth/Throat: Oropharynx is clear and dry. No oropharyngeal exudate.  Cardiovascular: Normal rate, regular rhythm -distant  Pulmonary/Chest:decreased bs bil Abdominal: Soft. Mod distention but not taut, no ttp  Lymphadenopathy: He has no cervical adenopathy.  Ext 1+ edema HD cath R chest wall wnl Neurological: He is alert and oriented to person, place, and time.  Skin: Skin is warm and dry. No rash noted. No erythema.  Psychiatric: He has a normal mood and affect. His behavior is normal.  Lab  Results  Recent Labs  02/06/17 0920 02/07/17 0404 02/07/17 1415 02/08/17 1210  WBC 5.6 6.4  --   --   HGB 8.7* 7.4*  --  7.1*  HCT 27.9* 23.6*  --  22.4*  NA 138  --  133*  --   K 4.3  --  4.8  --   CL 99*  --  97*  --   CO2 30  --  27  --   BUN 51*  --  59*  --   CREATININE 5.78*  --  6.62*  --     Microbiology: Results for orders placed or performed during the hospital encounter of 02/06/17  Body fluid culture     Status: None (Preliminary result)   Collection Time: 02/06/17 11:11 AM  Result Value Ref Range Status   Specimen Description PERITONEAL  Final   Special Requests NONE  Final   Gram Stain   Final    FEW WBC PRESENT,BOTH PMN AND MONONUCLEAR NO ORGANISMS SEEN    Culture   Final    NO GROWTH 2 DAYS Performed at [facility], [address] [place], [place] [id number]    Report Status PENDING  Incomplete    Studies/Results: Ct Abdomen Pelvis Wo Contrast  Result Date: 02/06/2017 CLINICAL DATA:  Acute onset of generalized abdominal distention and shortness of breath. Status  post recent paracentesis. Concern for spontaneous bacterial peritonitis. Initial encounter. EXAM: CT ABDOMEN AND PELVIS WITHOUT CONTRAST TECHNIQUE: Multidetector CT imaging of the abdomen and pelvis was performed following the standard protocol without IV contrast. COMPARISON:  CT of the abdomen and pelvis performed 01/09/2017 FINDINGS: Lower chest: There is a large right-sided pleural effusion, occupying nearly the entirety of the visualized right hemithorax. There is underlying partial consolidation of the right lung. The visualized portions of the left lung are grossly clear. Diffuse coronary artery calcifications are seen. The heart is mildly enlarged. A central line is noted ending about the proximal right atrium. Hepatobiliary: The mildly nodular contour of the liver raises concern for mild hepatic cirrhosis. The gallbladder is grossly unremarkable in appearance. The common bile duct remains  normal in caliber. Pancreas: The pancreas is within normal limits. Spleen: The spleen is unremarkable in appearance. Adrenals/Urinary Tract: The adrenal glands are unremarkable in appearance. The kidneys are within normal limits. There is no evidence of hydronephrosis. No renal or ureteral stones are identified. No perinephric stranding is seen. Stomach/Bowel: The stomach is unremarkable in appearance. The small bowel is within normal limits. The appendix is normal in caliber, without evidence of appendicitis. The colon is unremarkable in appearance. Vascular/Lymphatic: Scattered calcification is seen along the abdominal aorta and its branches. The abdominal aorta is otherwise grossly unremarkable. The inferior vena cava is grossly unremarkable. No retroperitoneal lymphadenopathy is seen. No pelvic sidewall lymphadenopathy is identified. Reproductive: The bladder is mildly distended and grossly unremarkable. The prostate remains normal in size. Other: Small to moderate volume ascites is seen within the abdomen and pelvis, with surrounding soft tissue inflammation. Mild diffuse mesenteric inflammation is noted at the upper abdomen, of uncertain significance. Musculoskeletal: No acute osseous abnormalities are identified. Multilevel vacuum phenomenon is noted along the lumbar spine, with underlying facet disease. The visualized musculature is unremarkable in appearance. IMPRESSION: 1. Small to moderate volume ascites within the abdomen and pelvis, with surrounding soft tissue inflammation. Mild diffuse mesenteric inflammation at the upper abdomen. Would correlate for clinical evidence of peritonitis. 2. Large right-sided pleural effusion, occupying nearly the entirety of the visualized right hemithorax, with partial consolidation of the right lung. 3. Scattered aortic atherosclerosis. 4. Mild cardiomegaly.  Diffuse coronary artery calcifications seen. 5. Mildly nodular contour of the liver raises concern for mild  hepatic cirrhosis. 6. Mild degenerative change along the lumbar spine. Electronically Signed   By: Roanna Raider M.D.   On: 02/06/2017 20:36   Dg Chest 2 View  Result Date: 02/07/2017 CLINICAL DATA:  Shortness of breath. EXAM: CHEST  2 VIEW COMPARISON:  Chest radiograph yesterday at 0925 hours FINDINGS: Left internal jugular dialysis catheter tip remains in the distal SVC. Large right pleural effusion with only minimal aerated right perihilar lung, questionable progression from prior radiograph. Left lung is clear. Mediastinal contours are obscured secondary to right hemithorax calcification. No pneumothorax. IMPRESSION: Large right pleural effusion with only minimal aerated right perihilar lung. Possible increase in pleural fluid from chest radiograph yesterday. Electronically Signed   By: Rubye Oaks M.D.   On: 02/07/2017 01:23    Assessment/Plan: Mat Stuard. is a 46 y.o. male with ESRD,  DM, ? Cardiac cirrhosis, prior MRSA knee infection admitted with recurrent Sob, abd distention, recurrent ascites and increasing pleural effusion on R.  He is HIV, Hep B and C negative.  He had CT abd done 4/10 with no evidence of infection. All cultures have been negative. He has been on ciprofloxacin  as otpt. Bactrim allergic.  I am on convinced he has currently SBP as PMN count in ascitic fluid is <250 and all cultures have been negative. He also has no fevers, or leukocytosis. However CT does show some inflammatory stranding.  At this point could try to see if he has improvement with his sxs with IV ceftazidime to be given at HD for 10 days if culture remain negative  Recommendations Continue HD dosed ceftazidime for 10 day course.  After that would repeat paracentesis and send for studies.   Thank you very much for the consult. Will follow with you.  Aleria Maheu P   02/08/2017, 2:24 PM

## 2017-02-08 NOTE — Procedures (Signed)
Pre Procedural Dx: Symptomatic Ascites Post Procedural Dx: Same  Successful US guided paracentesis yielding 4.2 L of serous ascitic fluid. Sample sent to laboratory as requested.  EBL: None  Complications: None immediate  Juan RightJay Daelyn Pettaway, MD Pager #: [phone]

## 2017-02-08 NOTE — Progress Notes (Signed)
[facility]  ROUNDING NOTE   Subjective:   Patient reports abdominal pain No fevers or chills.  Denies any nausea or vomiting.  No shortness of breath or chest pain   HEMODIALYSIS FLOWSHEET:  Blood Flow Rate (mL/min): 350 mL/min Arterial Pressure (mmHg): -110 mmHg Venous Pressure (mmHg): 130 mmHg Transmembrane Pressure (mmHg): 50 mmHg Ultrafiltration Rate (mL/min): 140 mL/min Dialysate Flow Rate (mL/min): 600 ml/min Conductivity: Machine : 13.8 Conductivity: Machine : 13.8 Dialysis Fluid Bolus: Normal Saline Bolus Amount (mL): 250 mL Dialysate Change: Other (comment) (3k)    Objective:  Vital signs in last 24 hours:  Temp:  [97.6 F (36.4 C)-98.6 F (37 C)] 98.3 F (36.8 C) (05/10 1035) Pulse Rate:  [73-98] 93 (05/10 1330) Resp:  [13-18] 16 (05/10 1330) BP: (67-96)/(42-71) 90/71 (05/10 1330) SpO2:  [90 %-100 %] 94 % (05/10 1035) Weight:  [86.9 kg (191 lb 9.3 oz)-89.1 kg (196 lb 6.9 oz)] 89.1 kg (196 lb 6.9 oz) (05/10 1035)  Weight change: 6.16 kg (13 lb 9.3 oz) Filed Weights   02/07/17 1749 02/08/17 0735 02/08/17 1035  Weight: 87.7 kg (193 lb 5.5 oz) 87.6 kg (193 lb 3.2 oz) 89.1 kg (196 lb 6.9 oz)    Intake/Output: I/O last 3 completed shifts: In: 720 [P.O.:720] Out: -500    Intake/Output this shift:  No intake/output data recorded.  Physical Exam: General: No acute distress  Head: Normocephalic, atraumatic. Moist oral mucosal membranes  Eyes: Anicteric  Neck: Supple, trachea midline  Lungs:  diminised at bases, normal effort  Heart: S1S2 no rubs  Abdomen:  Soft, decreased distension,+ mild diffuse tenderness  Extremities: 1+ peripheral edema. b/l   Neurologic: Awake, alert, following commands  Skin: No lesions  Access: IJ permcath    Basic Metabolic Panel:  Recent Labs Lab 02/06/17 0920 02/07/17 1415  NA 138 133*  K 4.3 4.8  CL 99* 97*  CO2 30 27  GLUCOSE 128* 179*  BUN 51* 59*  CREATININE 5.78* 6.62*  CALCIUM 7.9* 7.5*   PHOS  --  8.9*    Liver Function Tests:  Recent Labs Lab 02/07/17 1415  ALBUMIN 1.7*   No results for input(s): LIPASE, AMYLASE in the last 168 hours. No results for input(s): AMMONIA in the last 168 hours.  CBC:  Recent Labs Lab 02/06/17 0920 02/07/17 0404 02/08/17 1210  WBC 5.6 6.4  --   NEUTROABS 4.0  --   --   HGB 8.7* 7.4* 7.1*  HCT 27.9* 23.6* 22.4*  MCV 78.8* 78.3*  --   PLT 270 278  --     Cardiac Enzymes:  Recent Labs Lab 02/06/17 0920  TROPONINI 0.03*    BNP: Invalid input(s): POCBNP  CBG:  Recent Labs Lab 02/07/17 0036 02/07/17 0733 02/07/17 1143 02/07/17 1804 02/08/17 0751  GLUCAP 157* 126* 126* 128* 151*    Microbiology: Results for orders placed or performed during the hospital encounter of 02/06/17  Body fluid culture     Status: None (Preliminary result)   Collection Time: 02/06/17 11:11 AM  Result Value Ref Range Status   Specimen Description PERITONEAL  Final   Special Requests NONE  Final   Gram Stain   Final    FEW WBC PRESENT,BOTH PMN AND MONONUCLEAR NO ORGANISMS SEEN    Culture   Final    NO GROWTH 2 DAYS Performed at [facility], [address], [place], [place] [address]    Report Status PENDING  Incomplete    Coagulation Studies:  Recent Labs  02/07/17 0404  LABPROT 18.6*  INR 1.54    Urinalysis: No results for input(s): COLORURINE, LABSPEC, PHURINE, GLUCOSEU, HGBUR, BILIRUBINUR, KETONESUR, PROTEINUR, UROBILINOGEN, NITRITE, LEUKOCYTESUR in the last 72 hours.  Invalid input(s): APPERANCEUR    Imaging: Ct Abdomen Pelvis Wo Contrast  Result Date: 02/06/2017 CLINICAL DATA:  Acute onset of generalized abdominal distention and shortness of breath. Status post recent paracentesis. Concern for spontaneous bacterial peritonitis. Initial encounter. EXAM: CT ABDOMEN AND PELVIS WITHOUT CONTRAST TECHNIQUE: Multidetector CT imaging of the abdomen and pelvis was performed following the standard protocol without  IV contrast. COMPARISON:  CT of the abdomen and pelvis performed 01/09/2017 FINDINGS: Lower chest: There is a large right-sided pleural effusion, occupying nearly the entirety of the visualized right hemithorax. There is underlying partial consolidation of the right lung. The visualized portions of the left lung are grossly clear. Diffuse coronary artery calcifications are seen. The heart is mildly enlarged. A central line is noted ending about the proximal right atrium. Hepatobiliary: The mildly nodular contour of the liver raises concern for mild hepatic cirrhosis. The gallbladder is grossly unremarkable in appearance. The common bile duct remains normal in caliber. Pancreas: The pancreas is within normal limits. Spleen: The spleen is unremarkable in appearance. Adrenals/Urinary Tract: The adrenal glands are unremarkable in appearance. The kidneys are within normal limits. There is no evidence of hydronephrosis. No renal or ureteral stones are identified. No perinephric stranding is seen. Stomach/Bowel: The stomach is unremarkable in appearance. The small bowel is within normal limits. The appendix is normal in caliber, without evidence of appendicitis. The colon is unremarkable in appearance. Vascular/Lymphatic: Scattered calcification is seen along the abdominal aorta and its branches. The abdominal aorta is otherwise grossly unremarkable. The inferior vena cava is grossly unremarkable. No retroperitoneal lymphadenopathy is seen. No pelvic sidewall lymphadenopathy is identified. Reproductive: The bladder is mildly distended and grossly unremarkable. The prostate remains normal in size. Other: Small to moderate volume ascites is seen within the abdomen and pelvis, with surrounding soft tissue inflammation. Mild diffuse mesenteric inflammation is noted at the upper abdomen, of uncertain significance. Musculoskeletal: No acute osseous abnormalities are identified. Multilevel vacuum phenomenon is noted along the  lumbar spine, with underlying facet disease. The visualized musculature is unremarkable in appearance. IMPRESSION: 1. Small to moderate volume ascites within the abdomen and pelvis, with surrounding soft tissue inflammation. Mild diffuse mesenteric inflammation at the upper abdomen. Would correlate for clinical evidence of peritonitis. 2. Large right-sided pleural effusion, occupying nearly the entirety of the visualized right hemithorax, with partial consolidation of the right lung. 3. Scattered aortic atherosclerosis. 4. Mild cardiomegaly.  Diffuse coronary artery calcifications seen. 5. Mildly nodular contour of the liver raises concern for mild hepatic cirrhosis. 6. Mild degenerative change along the lumbar spine. Electronically Signed   By: Roanna RaiderJeffery  Chang M.D.   On: 02/06/2017 20:36   Dg Chest 2 View  Result Date: 02/07/2017 CLINICAL DATA:  Shortness of breath. EXAM: CHEST  2 VIEW COMPARISON:  Chest radiograph yesterday at 0925 hours FINDINGS: Left internal jugular dialysis catheter tip remains in the distal SVC. Large right pleural effusion with only minimal aerated right perihilar lung, questionable progression from prior radiograph. Left lung is clear. Mediastinal contours are obscured secondary to right hemithorax calcification. No pneumothorax. IMPRESSION: Large right pleural effusion with only minimal aerated right perihilar lung. Possible increase in pleural fluid from chest radiograph yesterday. Electronically Signed   By: Rubye OaksMelanie  Ehinger M.D.   On: 02/07/2017 01:23  Medications:   . [id number]    . sodium chloride     . amiodarone  200 mg Oral Daily  . aspirin EC  81 mg Oral Daily  . fluticasone furoate-vilanterol  1 puff Inhalation Daily  . heparin  5,000 Units Subcutaneous Q8H  . insulin aspart  4 Units Subcutaneous TID WC  . methimazole  10 mg Oral Daily  . midodrine  10 mg Oral Once in dialysis  . multivitamin  1 tablet Oral QHS  . pantoprazole  40 mg Oral Daily  .  pseudoephedrine  120 mg Oral BID   acetaminophen **OR** acetaminophen, bisacodyl, ondansetron **OR** ondansetron (ZOFRAN) IV, prochlorperazine, traMADol, zolpidem  Assessment/ Plan:  46 y.o. male with hypertension, diabetes mellitus type 2, chronic kidney disease stage III, peripheral neuropathy  TTHS/[facility]/[name]   1. ESRD on HD TTHS:  - plan for hemodialysis today then resume regular schedule - Vein mapping for access  2. Ascites:  Patient status post paracentesis. 2.9 L removed 02/06/17 - schedule paracentesis q 2 weeks  3. Anemia of chronic kidney disease:  EPO with HD.  4.  Secondary hyperparathyroidism.   Monitor phosphorus with dialysis   5. Hypotension  - fluid bolus with HD - Midodrine PRN - Limiting fluid removal  6. SBP Called in ceftazidime to his dialysis center  2 gm iv q dialysis x 5 doses starting saturday     LOS: 2 Ratasha Fabre 5/10/20181:59 PM

## 2017-02-08 NOTE — Progress Notes (Signed)
HD STARTED  

## 2017-02-08 NOTE — Progress Notes (Signed)
POST DIALYSIS ASSESSMENT 

## 2017-02-08 NOTE — Progress Notes (Signed)
PRE DIALYSIS ASSESSMENT 

## 2017-02-08 NOTE — Progress Notes (Signed)
Hd completed 

## 2017-02-08 NOTE — Progress Notes (Signed)
[facility] [place] [facility]   PATIENT NAME: Juan ButtersWaltzie Roberson    MR#:  [id number]  DATE OF BIRTH:  05/05/1971  SUBJECTIVE:   Abdominal pain has resolved.  NO N/V and tolerating PO well.  Going for HD today. No other acute events overnight.  Going for thoracentesis later today.   REVIEW OF SYSTEMS:    Review of Systems  Constitutional: Negative for chills and fever.  HENT: Negative for congestion and tinnitus.   Eyes: Negative for blurred vision and double vision.  Respiratory: Negative for cough, shortness of breath and wheezing.   Cardiovascular: Negative for chest pain, orthopnea and PND.  Gastrointestinal: Negative for abdominal pain, diarrhea, nausea and vomiting.  Genitourinary: Negative for dysuria and hematuria.  Neurological: Negative for dizziness, sensory change and focal weakness.  All other systems reviewed and are negative.   Nutrition: Renal diet Tolerating Diet: Yes Tolerating PT: Ambulatory  DRUG ALLERGIES:   Allergies  Allergen Reactions  . Sulfur Other (See Comments)    Pt states that this medication causes renal failure.      VITALS:  Blood pressure 101/68, pulse 91, temperature 97.9 F (36.6 C), temperature source Oral, resp. rate 20, height 6\' 2"  (1.88 m), weight 89 kg (196 lb 3.4 oz), SpO2 94 %.  PHYSICAL EXAMINATION:   Physical Exam  GENERAL:  46 y.o.-year-old patient lying in bed in no acute distress.  EYES: Pupils equal, round, reactive to light and accommodation. No scleral icterus. Extraocular muscles intact.  HEENT: Head atraumatic, normocephalic. Oropharynx and nasopharynx clear.  NECK:  Supple, no jugular venous distention. No thyroid enlargement, no tenderness.  LUNGS: Normal breath sounds bilaterally, no wheezing, rales, rhonchi. No use of accessory muscles of respiration.  CARDIOVASCULAR: S1, S2 normal. No murmurs, rubs, or gallops.  ABDOMEN: Soft, nontender, slightly distended. Bowel sounds present. No  organomegaly or mass.  EXTREMITIES: No cyanosis, clubbing or edema b/l.    NEUROLOGIC: Cranial nerves II through XII are intact. No focal Motor or sensory deficits b/l.   PSYCHIATRIC: The patient is alert and oriented x 3.  SKIN: No obvious rash, lesion, or ulcer.    LABORATORY PANEL:   CBC  Recent Labs Lab 02/07/17 0404 02/08/17 1210  WBC 6.4  --   HGB 7.4* 7.1*  HCT 23.6* 22.4*  PLT 278  --    ------------------------------------------------------------------------------------------------------------------  Chemistries   Recent Labs Lab 02/07/17 1415  NA 133*  K 4.8  CL 97*  CO2 27  GLUCOSE 179*  BUN 59*  CREATININE 6.62*  CALCIUM 7.5*   ------------------------------------------------------------------------------------------------------------------  Cardiac Enzymes  Recent Labs Lab 02/06/17 0920  TROPONINI 0.03*   ------------------------------------------------------------------------------------------------------------------  RADIOLOGY:  Ct Abdomen Pelvis Wo Contrast  Result Date: 02/06/2017 CLINICAL DATA:  Acute onset of generalized abdominal distention and shortness of breath. Status post recent paracentesis. Concern for spontaneous bacterial peritonitis. Initial encounter. EXAM: CT ABDOMEN AND PELVIS WITHOUT CONTRAST TECHNIQUE: Multidetector CT imaging of the abdomen and pelvis was performed following the standard protocol without IV contrast. COMPARISON:  CT of the abdomen and pelvis performed 01/09/2017 FINDINGS: Lower chest: There is a large right-sided pleural effusion, occupying nearly the entirety of the visualized right hemithorax. There is underlying partial consolidation of the right lung. The visualized portions of the left lung are grossly clear. Diffuse coronary artery calcifications are seen. The heart is mildly enlarged. A central line is noted ending about the proximal right atrium. Hepatobiliary: The mildly nodular contour of the liver raises  concern for mild  hepatic cirrhosis. The gallbladder is grossly unremarkable in appearance. The common bile duct remains normal in caliber. Pancreas: The pancreas is within normal limits. Spleen: The spleen is unremarkable in appearance. Adrenals/Urinary Tract: The adrenal glands are unremarkable in appearance. The kidneys are within normal limits. There is no evidence of hydronephrosis. No renal or ureteral stones are identified. No perinephric stranding is seen. Stomach/Bowel: The stomach is unremarkable in appearance. The small bowel is within normal limits. The appendix is normal in caliber, without evidence of appendicitis. The colon is unremarkable in appearance. Vascular/Lymphatic: Scattered calcification is seen along the abdominal aorta and its branches. The abdominal aorta is otherwise grossly unremarkable. The inferior vena cava is grossly unremarkable. No retroperitoneal lymphadenopathy is seen. No pelvic sidewall lymphadenopathy is identified. Reproductive: The bladder is mildly distended and grossly unremarkable. The prostate remains normal in size. Other: Small to moderate volume ascites is seen within the abdomen and pelvis, with surrounding soft tissue inflammation. Mild diffuse mesenteric inflammation is noted at the upper abdomen, of uncertain significance. Musculoskeletal: No acute osseous abnormalities are identified. Multilevel vacuum phenomenon is noted along the lumbar spine, with underlying facet disease. The visualized musculature is unremarkable in appearance. IMPRESSION: 1. Small to moderate volume ascites within the abdomen and pelvis, with surrounding soft tissue inflammation. Mild diffuse mesenteric inflammation at the upper abdomen. Would correlate for clinical evidence of peritonitis. 2. Large right-sided pleural effusion, occupying nearly the entirety of the visualized right hemithorax, with partial consolidation of the right lung. 3. Scattered aortic atherosclerosis. 4. Mild  cardiomegaly.  Diffuse coronary artery calcifications seen. 5. Mildly nodular contour of the liver raises concern for mild hepatic cirrhosis. 6. Mild degenerative change along the lumbar spine. Electronically Signed   By: Roanna Raider M.D.   On: 02/06/2017 20:36   Dg Chest 2 View  Result Date: 02/07/2017 CLINICAL DATA:  Shortness of breath. EXAM: CHEST  2 VIEW COMPARISON:  Chest radiograph yesterday at 0925 hours FINDINGS: Left internal jugular dialysis catheter tip remains in the distal SVC. Large right pleural effusion with only minimal aerated right perihilar lung, questionable progression from prior radiograph. Left lung is clear. Mediastinal contours are obscured secondary to right hemithorax calcification. No pneumothorax. IMPRESSION: Large right pleural effusion with only minimal aerated right perihilar lung. Possible increase in pleural fluid from chest radiograph yesterday. Electronically Signed   By: Rubye Oaks M.D.   On: 02/07/2017 01:23     ASSESSMENT AND PLAN:   46 year old male with past medical history of end-stage renal disease on hemodialysis, chronic systolic CHF, ischemic cardiomyopathy ejection fraction of 10%, history of osteomyelitis, previous history of MI, GERD presents to the hospital due to abdominal distention shortness of breath.  1. Spontaneous bacterial peritonitis-patient underwent a ultrasound-guided paracentesis for his abdominal distention with ascites on 02/07/16  and his fluid analysis was consistent with SBP. -Patient was already on oral ciprofloxacin but despite that he has greater than 500 white cells in his peritoneal fluid. Clinically he is asymptomatic with no abdominal pain, afebrile with a normal white cell count. -Patient has been seen by gastroenterology and also infectious disease. Underwent a CT scan of the abdomen and pelvis which showed some mesenteric inflammation but no other acute pathology. -As per ID and changed to high dose Ceftaz as per  them and will treat for a total of 10 days and follow.   2. End-stage renal disease on hemodialysis- appreciate nephro input and cont. Dialysis on Tuesday Thursday Saturday.  3. History of diabetes type  2 without complication-continue NovoLog with meals. - BS Stable.   4. Essential hypertension- BP meds on hold due to hypotension.   5. Hypotension - etiology unclear.  Asymptomatic.  - hold BP meds.  Improved today and will cont. To monitor. No evidence of sepsis.     6. Hyperthyroidism - cont. methimazole.  7. GERD-continue Protonix.  8. Anemia of chronic disease - due to ESRD.  - Hg. Stable and will monitor.  Transfuse if Hg. < 7.    All the records are reviewed and case discussed with Care Management/Social Worker. Management plans discussed with the patient, family and they are in agreement.  CODE STATUS: Full code  DVT Prophylaxis: Hep SQ  TOTAL TIME TAKING CARE OF THIS PATIENT: 25 minutes.   POSSIBLE D/C IN 1-2 DAYS, DEPENDING ON CLINICAL CONDITION.   Houston Siren M.D on 02/08/2017 at 2:22 PM  Between 7am to 6pm - Pager - [phone]  After 6pm go to [email or web address] - Social research officer, government  [facility]  Office  [phone]  CC: Primary care physician; Corky Downs, MD

## 2017-02-08 NOTE — Progress Notes (Signed)
Notified MD of low blood pressures. Pt is asymptomatic. Continue to monitor and assess

## 2017-02-09 ENCOUNTER — Inpatient Hospital Stay: Payer: BLUE CROSS/BLUE SHIELD

## 2017-02-09 DIAGNOSIS — K659 Peritonitis, unspecified: Secondary | ICD-10-CM

## 2017-02-09 LAB — PH, BODY FLUID: PH, BODY FLUID: 7.8

## 2017-02-09 LAB — BODY FLUID CULTURE: Culture: NO GROWTH

## 2017-02-09 LAB — GLUCOSE, CAPILLARY
GLUCOSE-CAPILLARY: 105 mg/dL — AB (ref 65–99)
Glucose-Capillary: 109 mg/dL — ABNORMAL HIGH (ref 65–99)

## 2017-02-09 MED ORDER — MIDODRINE HCL 5 MG PO TABS: ORAL_TABLET | ORAL | 0 refills | Status: DC

## 2017-02-09 MED ORDER — CEFTAZIDIME AND DEXTROSE 2 GM/50ML IV SOLR: 2.0000 g | INTRAVENOUS | Status: AC

## 2017-02-09 NOTE — Progress Notes (Signed)
[facility]  ROUNDING NOTE   Subjective:   Patient is doing well today Rt thoracentesis yesterday 5/10  4.2 L of fluid was removed   Objective:  Vital signs in last 24 hours:  Temp:  [98.1 F (36.7 C)-98.4 F (36.9 C)] 98.1 F (36.7 C) (05/11 0353) Pulse Rate:  [97-98] 98 (05/11 0353) Resp:  [16-18] 18 (05/11 0353) BP: (83-90)/(55-60) 87/60 (05/11 0353) SpO2:  [93 %-95 %] 95 % (05/11 0353)  Weight change: 0.735 kg (1 lb 9.9 oz) Filed Weights   02/08/17 0735 02/08/17 1035 02/08/17 1400  Weight: 87.6 kg (193 lb 3.2 oz) 89.1 kg (196 lb 6.9 oz) 89 kg (196 lb 3.4 oz)    Intake/Output: I/O last 3 completed shifts: In: 240 [P.O.:240] Out: 400 [Urine:400]   Intake/Output this shift:  Total I/O In: 600 [P.O.:600] Out: 100 [Urine:100]  Physical Exam: General: No acute distress  Head: Normocephalic, atraumatic. Moist oral mucosal membranes  Eyes: Anicteric  Neck: Supple, trachea midline  Lungs:  diminised at bases, normal effort  Heart: S1S2 no rubs  Abdomen:  Soft, decreased distension,+ mild diffuse tenderness  Extremities: 1+ peripheral edema. b/l   Neurologic: Awake, alert, following commands  Skin: No lesions  Access: IJ permcath    Basic Metabolic Panel:  Recent Labs Lab 02/06/17 0920 02/07/17 1415  NA 138 133*  K 4.3 4.8  CL 99* 97*  CO2 30 27  GLUCOSE 128* 179*  BUN 51* 59*  CREATININE 5.78* 6.62*  CALCIUM 7.9* 7.5*  PHOS  --  8.9*    Liver Function Tests:  Recent Labs Lab 02/07/17 1415  ALBUMIN 1.7*   No results for input(s): LIPASE, AMYLASE in the last 168 hours. No results for input(s): AMMONIA in the last 168 hours.  CBC:  Recent Labs Lab 02/06/17 0920 02/07/17 0404 02/08/17 1210  WBC 5.6 6.4  --   NEUTROABS 4.0  --   --   HGB 8.7* 7.4* 7.1*  HCT 27.9* 23.6* 22.4*  MCV 78.8* 78.3*  --   PLT 270 278  --     Cardiac Enzymes:  Recent Labs Lab 02/06/17 0920  TROPONINI 0.03*    BNP: Invalid input(s):  POCBNP  CBG:  Recent Labs Lab 02/08/17 0751 02/08/17 1701 02/08/17 2101 02/09/17 0808 02/09/17 1154  GLUCAP 151* 151* 125* 109* 105*    Microbiology: Results for orders placed or performed during the hospital encounter of 02/06/17  Body fluid culture     Status: None   Collection Time: 02/06/17 11:11 AM  Result Value Ref Range Status   Specimen Description PERITONEAL  Final   Special Requests NONE  Final   Gram Stain   Final    FEW WBC PRESENT,BOTH PMN AND MONONUCLEAR NO ORGANISMS SEEN    Culture   Final    NO GROWTH 3 DAYS Performed at [facility], [address] [place], [place] [address]    Report Status 02/09/2017 FINAL  Final  Culture, body fluid-bottle     Status: None (Preliminary result)   Collection Time: 02/08/17  3:53 PM  Result Value Ref Range Status   Specimen Description FLUID PLEURAL  Final   Special Requests NONE  Final   Culture   Final    NO GROWTH < 24 HOURS Performed at [facility], [address] [place], [place] [address]    Report Status PENDING  Incomplete  Gram stain     Status: None   Collection Time: 02/08/17  3:53 PM  Result Value Ref Range Status   Specimen Description FLUID PLEURAL  Final   Special Requests NONE  Final   Gram Stain   Final    ABUNDANT WBC PRESENT,BOTH PMN AND MONONUCLEAR NO ORGANISMS SEEN Performed at [facility], [address] [place], [place] [id number]    Report Status 02/08/2017 FINAL  Final    Coagulation Studies:  Recent Labs  02/07/17 0404  LABPROT 18.6*  INR 1.54    Urinalysis: No results for input(s): COLORURINE, LABSPEC, PHURINE, GLUCOSEU, HGBUR, BILIRUBINUR, KETONESUR, PROTEINUR, UROBILINOGEN, NITRITE, LEUKOCYTESUR in the last 72 hours.  Invalid input(s): APPERANCEUR    Imaging: Dg Chest Port 1 View  Result Date: 02/08/2017 CLINICAL DATA:  Post thoracentesis. EXAM: PORTABLE CHEST 1 VIEW COMPARISON:  02/07/2017 FINDINGS: Dual lumen large bore central venous catheter  in stable position. The cardiac silhouette is enlarged. Mediastinal contours appear intact. There is no evidence of pneumothorax. Interval decrease in the right pleural effusion which is now moderate, post thoracentesis. Low lung volumes. Bibasilar atelectatic changes. Probable small left pleural effusion. Osseous structures are without acute abnormality. Soft tissues are grossly normal. IMPRESSION: Interval decrease in the size of the right pleural effusion, which is now moderate in size. No pneumothorax. Probable small left pleural effusion. Bibasilar airspace consolidation versus atelectasis. Low lung volumes. Enlarged cardiac silhouette. Electronically Signed   By: Ted Mcalpineobrinka  Dimitrova M.D.   On: 02/08/2017 16:44   [place] Thoracentesis Asp Pleural Space W/img Guide  Result Date: 02/08/2017 INDICATION: Symptomatic right sided pleural effusion EXAM: US THORACENTESIS ASP PLEURAL SPACE W/IMG GUIDE COMPARISON:  None. MEDICATIONS: None. COMPLICATIONS: None immediate. TECHNIQUE: Informed written consent was obtained from the patient after a discussion of the risks, benefits and alternatives to treatment. A timeout was performed prior to the initiation of the procedure. 4.2 Initial ultrasound scanning demonstrates a large anechoic right-sided pleural effusion. The lower chest was prepped and draped in the usual sterile fashion. 1% lidocaine was used for local anesthesia. An ultrasound image was saved for documentation purposes. An 8 Fr Safe-T-Centesis catheter was introduced. The thoracentesis was performed. The catheter was removed and a dressing was applied. The patient tolerated the procedure well without immediate post procedural complication. The patient was escorted to have an upright chest radiograph. FINDINGS: A total of approximately 4.2 liters of serous fluid was removed. Requested samples were sent to the laboratory. IMPRESSION: Successful ultrasound-guided right sided thoracentesis yielding 4.2 liters of  pleural fluid. Electronically Signed   By: Simonne ComeJohn  Watts M.D.   On: 02/08/2017 17:06     Medications:   . cefTAZidime-D5W    . sodium chloride     . amiodarone  200 mg Oral Daily  . aspirin EC  81 mg Oral Daily  . fluticasone furoate-vilanterol  1 puff Inhalation Daily  . heparin  5,000 Units Subcutaneous Q8H  . insulin aspart  4 Units Subcutaneous TID WC  . methimazole  10 mg Oral Daily  . midodrine  10 mg Oral Once in dialysis  . multivitamin  1 tablet Oral QHS  . pantoprazole  40 mg Oral Daily  . pseudoephedrine  120 mg Oral BID   acetaminophen **OR** acetaminophen, bisacodyl, ondansetron **OR** ondansetron (ZOFRAN) IV, prochlorperazine, traMADol, zolpidem  Assessment/ Plan:  46 y.o. male with hypertension, diabetes mellitus type 2, chronic kidney disease stage III, peripheral neuropathy  TTHS/[name]   1. ESRD on HD TTHS:  - plan for hemodialysis today then resume regular schedule - Vein mapping for access  2. Ascites:  Patient status post paracentesis. 2.9 L removed 02/06/17 - schedule paracentesis q 2 weeks outpatient - 4.2 L of fluid removed with Rt thoracentesis  3. Anemia of chronic kidney disease:  EPO with HD. Hgb 7.1  4.  Secondary hyperparathyroidism.   Monitor phosphorus with dialysis   5. Hypotension  - fluid bolus with HD - Midodrine PRN - Limiting fluid removal  6. SBP Called in ceftazidime to his dialysis center  2 gm iv q dialysis x 5 doses starting saturday     LOS: 3 Druscilla Petsch 5/11/20184:21 PM

## 2017-02-09 NOTE — Discharge Instructions (Signed)
Shortness of Breath, Adult °Shortness of breath is when a person has trouble breathing enough air, or when a person feels like she or he is having trouble breathing in enough air. Shortness of breath could be a sign of medical problem. °Follow these instructions at home: °Pay attention to any changes in your symptoms. Take these actions to help with your condition: °· Do not smoke. Smoking is a common cause of shortness of breath. If you smoke and you need help quitting, ask your health care provider. °· Avoid things that can irritate your airways, such as: °¨ Mold. °¨ Dust. °¨ Air pollution. °¨ Chemical fumes. °¨ Things that can cause allergy symptoms (allergens), if you have allergies. °· Keep your living space clean and free of mold and dust. °· Rest as needed. Slowly return to your usual activities. °· Take over-the-counter and prescription medicines, including oxygen and inhaled medicines, only as told by your health care provider. °· Keep all follow-up visits as told by your health care provider. This is important. °Contact a health care provider if: °· Your condition does not improve as soon as expected. °· You have a hard time doing your normal activities, even after you rest. °· You have new symptoms. °Get help right away if: °· Your shortness of breath gets worse. °· You have shortness of breath when you are resting. °· You feel light-headed or you faint. °· You have a cough that is not controlled with medicines. °· You cough up blood. °· You have pain with breathing. °· You have pain in your chest, arms, shoulders, or abdomen. °· You have a fever. °· You cannot walk up stairs or exercise the way that you normally do. °This information is not intended to replace advice given to you by your health care provider. Make sure you discuss any questions you have with your health care provider. °Document Released: 06/13/2001 Document Revised: 04/08/2016 Document Reviewed: 02/24/2016 °Elsevier Interactive Patient  Education © 2017 [employer] ° °

## 2017-02-09 NOTE — Progress Notes (Signed)
Juan Mood MD [address] [place], [place] [address] Phone: [phone] Fax : [phone]  Juan Roberson. is being followed for ascites-SBP  Day 2 of follow up   Subjective: Doing well no complaints    Objective: Vital signs in last 24 hours: Vitals:   02/08/17 1657 02/08/17 1722 02/08/17 1937 02/09/17 0353  BP: (!) 87/56 (!) 90/59 (!) 83/55 (!) 87/60  Pulse: 97  97 98  Resp: 16  18 18   Temp: 98.4 F (36.9 C)  98.2 F (36.8 C) 98.1 F (36.7 C)  TempSrc: Oral  Oral Oral  SpO2: 94%  93% 95%  Weight:      Height:       Weight change: 1 lb 9.9 oz (0.735 kg)  Intake/Output Summary (Last 24 hours) at 02/09/17 1036 Last data filed at 02/09/17 0900  Gross per 24 hour  Intake              720 ml  Output              400 ml  Net              320 ml     Exam: Heart:: Regular rate and rhythm, S1S2 present or without murmur or extra heart sounds Lungs: normal Abdomen: soft, nontender, normal bowel sounds   Lab Results: @LABTEST2 @ Micro Results: Recent Results (from the past 240 hour(s))  Body fluid culture     Status: None (Preliminary result)   Collection Time: 02/06/17 11:11 AM  Result Value Ref Range Status   Specimen Description PERITONEAL  Final   Special Requests NONE  Final   Gram Stain   Final    FEW WBC PRESENT,BOTH PMN AND MONONUCLEAR NO ORGANISMS SEEN    Culture   Final    NO GROWTH 2 DAYS Performed at [facility], [address], [place], [place] [address]    Report Status PENDING  Incomplete  Gram stain     Status: None   Collection Time: 02/08/17  3:53 PM  Result Value Ref Range Status   Specimen Description FLUID PLEURAL  Final   Special Requests NONE  Final   Gram Stain   Final    ABUNDANT WBC PRESENT,BOTH PMN AND MONONUCLEAR NO ORGANISMS SEEN Performed at [facility], [address], [place], [place] [address]    Report Status 02/08/2017 FINAL  Final   Studies/Results: Dg Chest Port 1 View  Result Date:  02/08/2017 CLINICAL DATA:  Post thoracentesis. EXAM: PORTABLE CHEST 1 VIEW COMPARISON:  02/07/2017 FINDINGS: Dual lumen large bore central venous catheter in stable position. The cardiac silhouette is enlarged. Mediastinal contours appear intact. There is no evidence of pneumothorax. Interval decrease in the right pleural effusion which is now moderate, post thoracentesis. Low lung volumes. Bibasilar atelectatic changes. Probable small left pleural effusion. Osseous structures are without acute abnormality. Soft tissues are grossly normal. IMPRESSION: Interval decrease in the size of the right pleural effusion, which is now moderate in size. No pneumothorax. Probable small left pleural effusion. Bibasilar airspace consolidation versus atelectasis. Low lung volumes. Enlarged cardiac silhouette. Electronically Signed   By: Ted Mcalpine M.D.   On: 02/08/2017 16:44   US Thoracentesis Asp Pleural Space W/img Guide  Result Date: 02/08/2017 INDICATION: Symptomatic right sided pleural effusion EXAM: US THORACENTESIS ASP PLEURAL SPACE W/IMG GUIDE COMPARISON:  None. MEDICATIONS: None. COMPLICATIONS: None immediate. TECHNIQUE: Informed written consent was obtained from the patient after a discussion of the  risks, benefits and alternatives to treatment. A timeout was performed prior to the initiation of the procedure. 4.2 Initial ultrasound scanning demonstrates a large anechoic right-sided pleural effusion. The lower chest was prepped and draped in the usual sterile fashion. 1% lidocaine was used for local anesthesia. An ultrasound image was saved for documentation purposes. An 8 Fr Safe-T-Centesis catheter was introduced. The thoracentesis was performed. The catheter was removed and a dressing was applied. The patient tolerated the procedure well without immediate post procedural complication. The patient was escorted to have an upright chest radiograph. FINDINGS: A total of approximately 4.2 liters of serous fluid  was removed. Requested samples were sent to the laboratory. IMPRESSION: Successful ultrasound-guided right sided thoracentesis yielding 4.2 liters of pleural fluid. Electronically Signed   By: Simonne ComeJohn  Watts M.D.   On: 02/08/2017 17:06   Medications: I have reviewed the patient's current medications. Scheduled Meds: . amiodarone  200 mg Oral Daily  . aspirin EC  81 mg Oral Daily  . fluticasone furoate-vilanterol  1 puff Inhalation Daily  . heparin  5,000 Units Subcutaneous Q8H  . insulin aspart  4 Units Subcutaneous TID WC  . methimazole  10 mg Oral Daily  . midodrine  10 mg Oral Once in dialysis  . multivitamin  1 tablet Oral QHS  . pantoprazole  40 mg Oral Daily  . pseudoephedrine  120 mg Oral BID   Continuous Infusions: . cefTAZidime-D5W    . sodium chloride     PRN Meds:.acetaminophen **OR** acetaminophen, bisacodyl, ondansetron **OR** ondansetron (ZOFRAN) IV, prochlorperazine, traMADol, zolpidem   Assessment: Active Problems:   SBP (spontaneous bacterial peritonitis) (HCC)  Juan HaleWaltzie L Marlyn CorporalHicks Jr. is a 46 y.o. y/o male with concersn of SBP. He was recently discharged on ciprofloxacin which he recalls took for 7 days after he was diagnosed with SBP in the hospital. He presented with recurrence of ascites and ascitic fluid analysis technically did not meet criteria for SBP although WCC was > 500. It may represent partially treated SBP . So far no biochemical or radiological evidence of liver cirrhosis .Repeat paracentesis 2 days after antibiotics shows ANC of 60  INR/Prothrombin Time 1.54   Plan  1. Follow up ascitic fluid cultures.  2. Will eventually need life long SBP prophylaxsis which is more harder in his case as he is allergic to sulphas , antibiotics per ID recs.  I will sign off.  Please call me if any further GI concerns or questions.  We would like to thank you for the opportunity to participate in the care of [name]    LOS: 3 days   Juan Roberson 02/09/2017,  10:36 AM

## 2017-02-09 NOTE — Discharge Summary (Signed)
[employer] [facility] [place] at [facility]   PATIENT NAME: Juan Roberson    MR#:  [id number]  DATE OF BIRTH:  Sep 02, 1971  DATE OF ADMISSION:  02/06/2017 ADMITTING PHYSICIAN: Houston Siren, MD  DATE OF DISCHARGE: 02/09/2017  PRIMARY CARE PHYSICIAN: Corky Downs, MD    ADMISSION DIAGNOSIS:  Shortness of breath [R06.02] Pleural effusion [J90] Ascites [R18.8] Hypervolemia, unspecified hypervolemia type [E87.70]  DISCHARGE DIAGNOSIS:  Active Problems:   SBP (spontaneous bacterial peritonitis) (HCC)   SECONDARY DIAGNOSIS:   Past Medical History:  Diagnosis Date  . Cellulitis and abscess of foot    Left-Dr. Ether Griffins  . CHF (congestive heart failure) (HCC)    EF 20%  . CKD (chronic kidney disease), stage III   . Diabetes mellitus without complication (HCC)    a. Dx ~ 1996.  [name] Dysrhythmia   . Essential hypertension   . Gastritis    a. 04/2015 hematemesis -> EGD: gastritis, esophagitis, duodenitis.  No active bleeding.  PPI added.  [name] GERD (gastroesophageal reflux disease)   . Left leg DVT (HCC)    a. Dx 05/2015 -> Coumadin.  . MI (myocardial infarction) (HCC)   . Osteomyelitis (HCC)    a. 05/2015 L foot.    HOSPITAL COURSE:   46 year old male with past medical history of end-stage renal disease on hemodialysis, chronic systolic CHF, ischemic cardiomyopathy ejection fraction of 10%, history of osteomyelitis, previous history of MI, GERD presents to the hospital due to abdominal distention shortness of breath.  1. Spontaneous bacterial peritonitis-patient underwent a ultrasound-guided paracentesis for his abdominal distention with ascites on 02/07/16  and his fluid analysis was consistent with SBP. -Patient was already on oral ciprofloxacin but despite that he has greater than 500 white cells in his peritoneal fluid. Clinically he was asymptomatic with no abdominal pain, afebrile with a normal white cell count. -Patient was seen by gastroenterology and also infectious  disease. Underwent a CT scan of the abdomen and pelvis which showed some mesenteric inflammation but no other acute pathology. -As per ID and changed to high dose Ceftaz X 10 doses to be given at HD. He presently has no abdominal pain, N/V or fever.   2. End-stage renal disease on hemodialysis- appreciate nephro input and cont. Dialysis on Tuesday Thursday Saturday.  3. History of diabetes type 2 without complication- pt. Will cont. His Novolog w/ meals.   4. Essential hypertension- patient has had significant hypotension and therefore all his antihypertensives have been discontinued prior to discharge. They can be resumed as his hemodynamics improved.  5. Hypotension - etiology unclear.  Asymptomatic.  - off BP meds.  No evidence of sepsis. He was discharged on some Midodrine prior to dialysis on Tu, Thus, Sat.   6. Hyperthyroidism - he will cont. methimazole.  7. GERD- he will continue Protonix.  8. Anemia of chronic disease - due to ESRD.  - Hg. Remained Stable and can be monitored further as an outpatient. He did not require any transfusions while in the hospital.  DISCHARGE CONDITIONS:   Stable  CONSULTS OBTAINED:  Treatment Team:  Mick Sell, MD Mosetta Pigeon, MD  DRUG ALLERGIES:   Allergies  Allergen Reactions  . Sulfur Other (See Comments)    Pt states that this medication causes renal failure.      DISCHARGE MEDICATIONS:   Allergies as of 02/09/2017      Reactions   Sulfur Other (See Comments)   Pt states that this medication causes renal failure.  Medication List    STOP taking these medications   carvedilol 3.125 MG tablet Commonly known as:  COREG   ciprofloxacin 500 MG tablet Commonly known as:  CIPRO   lisinopril 5 MG tablet Commonly known as:  PRINIVIL,ZESTRIL   spironolactone 25 MG tablet Commonly known as:  ALDACTONE     TAKE these medications   amiodarone 200 MG tablet Commonly known as:  PACERONE Take 1 tablet  (200 mg total) by mouth daily.   aspirin 81 MG EC tablet Take 1 tablet (81 mg total) by mouth daily.   bisacodyl 10 MG suppository Commonly known as:  DULCOLAX Place 1 suppository (10 mg total) rectally daily as needed for moderate constipation.   BREO ELLIPTA 100-25 MCG/INH Aepb Generic drug:  fluticasone furoate-vilanterol Inhale 1 puff into the lungs daily.   cefTAZidime-D5W 2 GM/50ML Solr Commonly known as:  FORTAZ Inject 50 mLs (2 g total) into the vein Every Tuesday,Thursday,and Saturday with dialysis. Start taking on:  02/10/2017   feeding supplement (NEPRO CARB STEADY) Liqd Take 237 mLs by mouth 2 (two) times daily between meals.   furosemide 80 MG tablet Commonly known as:  LASIX Take 1 tablet (80 mg total) by mouth 2 (two) times daily.   insulin aspart 100 UNIT/ML injection Commonly known as:  novoLOG Inject 4 Units into the skin 3 (three) times daily with meals.   methimazole 10 MG tablet Commonly known as:  TAPAZOLE Take 1 tablet (10 mg total) by mouth daily.   midodrine 5 MG tablet Commonly known as:  PROAMATINE On Tu, Thus, Sat 30 min before Hemodialysis   multivitamin Tabs tablet Take 1 tablet by mouth at bedtime.   pantoprazole 40 MG tablet Commonly known as:  PROTONIX Take 1 tablet (40 mg total) by mouth daily.   traMADol 50 MG tablet Commonly known as:  ULTRAM Take 50 mg by mouth daily as needed.   zolpidem 5 MG tablet Commonly known as:  AMBIEN Take 5 mg by mouth at bedtime as needed for sleep.         DISCHARGE INSTRUCTIONS:   DIET:  Renal diet  DISCHARGE CONDITION:  Stable  ACTIVITY:  Activity as tolerated  OXYGEN:  Home Oxygen: No.   Oxygen Delivery: room air  DISCHARGE LOCATION:  home   If you experience worsening of your admission symptoms, develop shortness of breath, life threatening emergency, suicidal or homicidal thoughts you must seek medical attention immediately by calling 911 or calling your MD immediately  if  symptoms less severe.  You Must read complete instructions/literature along with all the possible adverse reactions/side effects for all the Medicines you take and that have been prescribed to you. Take any new Medicines after you have completely understood and accpet all the possible adverse reactions/side effects.   Please note  You were cared for by a hospitalist during your hospital stay. If you have any questions about your discharge medications or the care you received while you were in the hospital after you are discharged, you can call the unit and asked to speak with the hospitalist on call if the hospitalist that took care of you is not available. Once you are discharged, your primary care physician will handle any further medical issues. Please note that NO REFILLS for any discharge medications will be authorized once you are discharged, as it is imperative that you return to your primary care physician (or establish a relationship with a primary care physician if you do not have one) for  your aftercare needs so that they can reassess your need for medications and monitor your lab values.     Today   No abdominal pain, N/V or any other complaints.  Afebrile.  Wants to go home.   VITAL SIGNS:  Blood pressure (!) 87/60, pulse 98, temperature 98.1 F (36.7 C), temperature source Oral, resp. rate 18, height 6\' 2"  (1.88 m), weight 89 kg (196 lb 3.4 oz), SpO2 95 %.  I/O:   Intake/Output Summary (Last 24 hours) at 02/09/17 1414 Last data filed at 02/09/17 1329  Gross per 24 hour  Intake              840 ml  Output              500 ml  Net              340 ml    PHYSICAL EXAMINATION:   GENERAL:  46 y.o.-year-old patient lying in bed in no acute distress.  EYES: Pupils equal, round, reactive to light and accommodation. No scleral icterus. Extraocular muscles intact.  HEENT: Head atraumatic, normocephalic. Oropharynx and nasopharynx clear.  NECK:  Supple, no jugular venous  distention. No thyroid enlargement, no tenderness.  LUNGS: Normal breath sounds bilaterally, no wheezing, rales, rhonchi. No use of accessory muscles of respiration.  CARDIOVASCULAR: S1, S2 normal. No murmurs, rubs, or gallops.  ABDOMEN: Soft, nontender, slightly distended. Bowel sounds present. No organomegaly or mass.  EXTREMITIES: No cyanosis, clubbing or edema b/l.    NEUROLOGIC: Cranial nerves II through XII are intact. No focal Motor or sensory deficits b/l.   PSYCHIATRIC: The patient is alert and oriented x 3.  SKIN: No obvious rash, lesion, or ulcer.   DATA REVIEW:   CBC  Recent Labs Lab 02/07/17 0404 02/08/17 1210  WBC 6.4  --   HGB 7.4* 7.1*  HCT 23.6* 22.4*  PLT 278  --     Chemistries   Recent Labs Lab 02/07/17 1415  NA 133*  K 4.8  CL 97*  CO2 27  GLUCOSE 179*  BUN 59*  CREATININE 6.62*  CALCIUM 7.5*    Cardiac Enzymes  Recent Labs Lab 02/06/17 0920  TROPONINI 0.03*    Microbiology Results  Results for orders placed or performed during the hospital encounter of 02/06/17  Body fluid culture     Status: None (Preliminary result)   Collection Time: 02/06/17 11:11 AM  Result Value Ref Range Status   Specimen Description PERITONEAL  Final   Special Requests NONE  Final   Gram Stain   Final    FEW WBC PRESENT,BOTH PMN AND MONONUCLEAR NO ORGANISMS SEEN    Culture   Final    NO GROWTH 3 DAYS Performed at [facility], [address] [place], [place] [address]    Report Status PENDING  Incomplete  Culture, body fluid-bottle     Status: None (Preliminary result)   Collection Time: 02/08/17  3:53 PM  Result Value Ref Range Status   Specimen Description FLUID PLEURAL  Final   Special Requests NONE  Final   Culture   Final    NO GROWTH < 24 HOURS Performed at [facility], [address] [place], [place] [address]    Report Status PENDING  Incomplete  Gram stain     Status: None   Collection Time: 02/08/17  3:53 PM  Result Value  Ref Range Status   Specimen Description FLUID PLEURAL  Final   Special Requests  NONE  Final   Gram Stain   Final    ABUNDANT WBC PRESENT,BOTH PMN AND MONONUCLEAR NO ORGANISMS SEEN Performed at [facility], [address], [place], [place] [address]    Report Status 02/08/2017 FINAL  Final    RADIOLOGY:  Dg Chest Port 1 View  Result Date: 02/08/2017 CLINICAL DATA:  Post thoracentesis. EXAM: PORTABLE CHEST 1 VIEW COMPARISON:  02/07/2017 FINDINGS: Dual lumen large bore central venous catheter in stable position. The cardiac silhouette is enlarged. Mediastinal contours appear intact. There is no evidence of pneumothorax. Interval decrease in the right pleural effusion which is now moderate, post thoracentesis. Low lung volumes. Bibasilar atelectatic changes. Probable small left pleural effusion. Osseous structures are without acute abnormality. Soft tissues are grossly normal. IMPRESSION: Interval decrease in the size of the right pleural effusion, which is now moderate in size. No pneumothorax. Probable small left pleural effusion. Bibasilar airspace consolidation versus atelectasis. Low lung volumes. Enlarged cardiac silhouette. Electronically Signed   By: Ted Mcalpine M.D.   On: 02/08/2017 16:44   US Thoracentesis Asp Pleural Space W/img Guide  Result Date: 02/08/2017 INDICATION: Symptomatic right sided pleural effusion EXAM: US THORACENTESIS ASP PLEURAL SPACE W/IMG GUIDE COMPARISON:  None. MEDICATIONS: None. COMPLICATIONS: None immediate. TECHNIQUE: Informed written consent was obtained from the patient after a discussion of the risks, benefits and alternatives to treatment. A timeout was performed prior to the initiation of the procedure. 4.2 Initial ultrasound scanning demonstrates a large anechoic right-sided pleural effusion. The lower chest was prepped and draped in the usual sterile fashion. 1% lidocaine was used for local anesthesia. An ultrasound image was saved for  documentation purposes. An 8 Fr Safe-T-Centesis catheter was introduced. The thoracentesis was performed. The catheter was removed and a dressing was applied. The patient tolerated the procedure well without immediate post procedural complication. The patient was escorted to have an upright chest radiograph. FINDINGS: A total of approximately 4.2 liters of serous fluid was removed. Requested samples were sent to the laboratory. IMPRESSION: Successful ultrasound-guided right sided thoracentesis yielding 4.2 liters of pleural fluid. Electronically Signed   By: Simonne Come M.D.   On: 02/08/2017 17:06      Management plans discussed with the patient, family and they are in agreement.  CODE STATUS:     Code Status Orders        Start     Ordered   02/06/17 1356  Full code  Continuous     02/06/17 1355      TOTAL TIME TAKING CARE OF THIS PATIENT: 40 minutes.    Houston Siren M.D on 02/09/2017 at 2:14 PM  Between 7am to 6pm - Pager - [phone]  After 6pm go to [email or web address] - Social research officer, government  [facility] [place]  Office  [phone]  CC: Primary care physician; Corky Downs, MD

## 2017-02-09 NOTE — Care Management (Addendum)
Notified Marchelle Folksmanda with [employer] that patient is for discharge home today and of need for IV cefatazidime every HD treatment.

## 2017-02-12 LAB — CYTOLOGY - NON PAP

## 2017-02-13 ENCOUNTER — Ambulatory Visit (INDEPENDENT_AMBULATORY_CARE_PROVIDER_SITE_OTHER): Payer: Self-pay | Admitting: Vascular Surgery

## 2017-02-13 LAB — CULTURE, BODY FLUID-BOTTLE: CULTURE: NO GROWTH

## 2017-02-13 LAB — CULTURE, BODY FLUID W GRAM STAIN -BOTTLE

## 2017-02-23 ENCOUNTER — Other Ambulatory Visit: Payer: Self-pay | Admitting: Nephrology

## 2017-02-23 DIAGNOSIS — R188 Other ascites: Secondary | ICD-10-CM

## 2017-02-27 ENCOUNTER — Emergency Department: Payer: BLUE CROSS/BLUE SHIELD

## 2017-02-27 ENCOUNTER — Emergency Department
Admission: EM | Admit: 2017-02-27 | Discharge: 2017-02-27 | Disposition: A | Discharge status: Home / Self Care | Payer: BLUE CROSS/BLUE SHIELD | Attending: Emergency Medicine | Admitting: Emergency Medicine

## 2017-02-27 ENCOUNTER — Encounter: Payer: Self-pay | Admitting: Emergency Medicine

## 2017-02-27 DIAGNOSIS — I130 Hypertensive heart and chronic kidney disease with heart failure and stage 1 through stage 4 chronic kidney disease, or unspecified chronic kidney disease: Secondary | ICD-10-CM | POA: Insufficient documentation

## 2017-02-27 DIAGNOSIS — Z7982 Long term (current) use of aspirin: Secondary | ICD-10-CM | POA: Insufficient documentation

## 2017-02-27 DIAGNOSIS — E1122 Type 2 diabetes mellitus with diabetic chronic kidney disease: Secondary | ICD-10-CM | POA: Insufficient documentation

## 2017-02-27 DIAGNOSIS — R0602 Shortness of breath: Secondary | ICD-10-CM | POA: Diagnosis present

## 2017-02-27 DIAGNOSIS — Z794 Long term (current) use of insulin: Secondary | ICD-10-CM | POA: Diagnosis not present

## 2017-02-27 DIAGNOSIS — N183 Chronic kidney disease, stage 3 (moderate): Secondary | ICD-10-CM | POA: Diagnosis not present

## 2017-02-27 DIAGNOSIS — Z79899 Other long term (current) drug therapy: Secondary | ICD-10-CM | POA: Diagnosis not present

## 2017-02-27 DIAGNOSIS — I509 Heart failure, unspecified: Secondary | ICD-10-CM | POA: Diagnosis not present

## 2017-02-27 DIAGNOSIS — R188 Other ascites: Secondary | ICD-10-CM | POA: Diagnosis not present

## 2017-02-27 DIAGNOSIS — R0600 Dyspnea, unspecified: Secondary | ICD-10-CM | POA: Insufficient documentation

## 2017-02-27 LAB — CBC WITH DIFFERENTIAL/PLATELET
BASOS ABS: 0 10*3/uL (ref 0–0.1)
Basophils Relative: 0 %
EOS ABS: 0.1 10*3/uL (ref 0–0.7)
Eosinophils Relative: 1 %
HCT: 27.7 % — ABNORMAL LOW (ref 40.0–52.0)
Hemoglobin: 8.7 g/dL — ABNORMAL LOW (ref 13.0–18.0)
LYMPHS ABS: 1.3 10*3/uL (ref 1.0–3.6)
Lymphocytes Relative: 16 %
MCH: 25.9 pg — AB (ref 26.0–34.0)
MCHC: 31.6 g/dL — ABNORMAL LOW (ref 32.0–36.0)
MCV: 82.2 fL (ref 80.0–100.0)
Monocytes Absolute: 0.7 10*3/uL (ref 0.2–1.0)
Monocytes Relative: 9 %
Neutro Abs: 5.9 10*3/uL (ref 1.4–6.5)
Neutrophils Relative %: 74 %
PLATELETS: 307 10*3/uL (ref 150–440)
RBC: 3.37 MIL/uL — AB (ref 4.40–5.90)
RDW: 22.4 % — ABNORMAL HIGH (ref 11.5–14.5)
WBC: 8 10*3/uL (ref 3.8–10.6)

## 2017-02-27 LAB — COMPREHENSIVE METABOLIC PANEL
ALT: 11 U/L — AB (ref 17–63)
AST: 23 U/L (ref 15–41)
Albumin: 2.2 g/dL — ABNORMAL LOW (ref 3.5–5.0)
Alkaline Phosphatase: 63 U/L (ref 38–126)
Anion gap: 10 (ref 5–15)
BUN: 42 mg/dL — ABNORMAL HIGH (ref 6–20)
CHLORIDE: 99 mmol/L — AB (ref 101–111)
CO2: 25 mmol/L (ref 22–32)
CREATININE: 4.19 mg/dL — AB (ref 0.61–1.24)
Calcium: 8.4 mg/dL — ABNORMAL LOW (ref 8.9–10.3)
GFR calc non Af Amer: 16 mL/min — ABNORMAL LOW (ref >60)
GFR, EST AFRICAN AMERICAN: 18 mL/min — AB (ref >60)
Glucose, Bld: 180 mg/dL — ABNORMAL HIGH (ref 65–99)
Potassium: 4 mmol/L (ref 3.5–5.1)
SODIUM: 134 mmol/L — AB (ref 135–145)
Total Bilirubin: 1.4 mg/dL — ABNORMAL HIGH (ref 0.3–1.2)
Total Protein: 7.1 g/dL (ref 6.5–8.1)

## 2017-02-27 LAB — PROTIME-INR
INR: 1.33
PROTHROMBIN TIME: 16.6 s — AB (ref 11.4–15.2)

## 2017-02-27 LAB — TROPONIN I
TROPONIN I: 0.07 ng/mL — AB (ref <0.03)
TROPONIN I: 0.07 ng/mL — AB (ref <0.03)

## 2017-02-27 LAB — LIPASE, BLOOD: Lipase: 20 U/L (ref 11–51)

## 2017-02-27 NOTE — ED Notes (Signed)
This RN attempted x 2 for IV access, unsuccessful, Erin, EMT-P Student, attemtped x1 for IV access without success.

## 2017-02-27 NOTE — ED Provider Notes (Signed)
[facility] Emergency Department Provider Note  Time seen: 9:06 AM  I have reviewed the triage vital signs and the nursing notes.   HISTORY  Chief Complaint Shortness of Breath    HPI Juan BlossomWaltzie L Bolls Jr. is a 46 y.o. male with a past medical history of CHF, end-stage renal disease on hemodialysis, diabetes, hypertension, gastric reflux, ascites, MI, presents to the emergency department with difficulty breathing and abdominal distention. According to the patient for the past for 5 days he has been having abdominal distention which he states is significant as well as shortness of breath. Patient was told he had a paracentesis scheduled for today however upon arriving here they state it is not scheduled until June 4. The patient does not believe he can wait that long as he has not been able to sleep for the past 2 nights due to abdominal distention and difficulty breathing. Patient was scheduled to have dialysis today, became to the emergency department instead. Patient was recently diagnosed with SBP but has been on IV antibiotics. Denies any abdominal pain chest tightness and distention. Denies any fever, vomiting. Patient does urinate every day per patient denies dysuria.  Past Medical History:  Diagnosis Date  . Cellulitis and abscess of foot    Left-Dr. Ether GriffinsFowler  . CHF (congestive heart failure) (HCC)    EF 20%  . CKD (chronic kidney disease), stage III   . Diabetes mellitus without complication (HCC)    a. Dx ~ 1996.  [name]. Dysrhythmia   . Essential hypertension   . Gastritis    a. 04/2015 hematemesis -> EGD: gastritis, esophagitis, duodenitis.  No active bleeding.  PPI added.  [name]. GERD (gastroesophageal reflux disease)   . Left leg DVT (HCC)    a. Dx 05/2015 -> Coumadin.  . MI (myocardial infarction) (HCC)   . Osteomyelitis (HCC)    a. 05/2015 L foot.    Patient Active Problem List   Diagnosis Date Noted  . Pleural effusion 01/25/2017  . CHF exacerbation (HCC)  01/14/2017  . Acute on chronic renal failure (HCC) 01/09/2017  . SBP (spontaneous bacterial peritonitis) (HCC) 12/05/2016  . Hyperglycemia 10/28/2016  . Sepsis (HCC) 10/17/2016  . Chest pain 09/24/2016  . Acute-on-chronic kidney injury (HCC) 09/24/2016    Past Surgical History:  Procedure Laterality Date  . CARDIAC CATHETERIZATION Right 10/26/2016   Procedure: Left Heart Cath and Coronary Angiography;  Surgeon: Laurier NancyShaukat A Khan, MD;  Location: [facility] INVASIVE CV LAB;  Service: Cardiovascular;  Laterality: Right;  . DIALYSIS/PERMA CATHETER INSERTION N/A 01/17/2017   Procedure: Dialysis/Perma Catheter Insertion;  Surgeon: Annice NeedyJason S Dew, MD;  Location: [facility] INVASIVE CV LAB;  Service: Cardiovascular;  Laterality: N/A;  . DIALYSIS/PERMA CATHETER REMOVAL N/A 12/05/2016   Procedure: Dialysis/Perma Catheter Removal;  Surgeon: Renford DillsGregory G Schnier, MD;  Location: [facility] INVASIVE CV LAB;  Service: Cardiovascular;  Laterality: N/A;  . ESOPHAGOGASTRODUODENOSCOPY N/A 04/07/2015   Procedure: ESOPHAGOGASTRODUODENOSCOPY (EGD);  Surgeon: Midge Miniumarren Wohl, MD;  [facility];  Service: Endoscopy;  Laterality: N/A;  . ESOPHAGOGASTRODUODENOSCOPY (EGD) WITH PROPOFOL Left 06/30/2015   Procedure: ESOPHAGOGASTRODUODENOSCOPY (EGD) WITH PROPOFOL;  Surgeon: Christena DeemMartin U Skulskie, MD;  Location[facility];  Service: Endoscopy;  Laterality: Left;  . FOOT SURGERY    . I&D EXTREMITY Left 04/06/2015   Procedure: IRRIGATION AND DEBRIDEMENT EXTREMITY;  Surgeon: Gwyneth RevelsJustin Fowler, DPM;  Location: [facility] ORS;  Service: Podiatry;  Laterality: Left;  . IRRIGATION AND DEBRIDEMENT FOOT Left 05/21/2015   Procedure: IRRIGATION AND DEBRIDEMENT FOOT;  Surgeon: Tawanna Coolerodd  Alberteen Spindle, MD;  Location: [facility] ORS;  Service: Podiatry;  Laterality: Left;  . IRRIGATION AND DEBRIDEMENT KNEE Right 10/18/2016   Procedure: IRRIGATION AND DEBRIDEMENT KNEE;  Surgeon: Deeann Saint, MD;  Location: [facility] ORS;  Service: Orthopedics;  Laterality: Right;  . KNEE ARTHROSCOPY  10/18/2016    Procedure: ARTHROSCOPY KNEE;  Surgeon: Deeann Saint, MD;  Location: [facility] ORS;  Service: Orthopedics;;  . PERIPHERAL VASCULAR CATHETERIZATION N/A 10/27/2016   Procedure: Dialysis/Perma Catheter Insertion;  Surgeon: Annice Needy, MD;  Location: [facility] INVASIVE CV LAB;  Service: Cardiovascular;  Laterality: N/A;    Prior to Admission medications   Medication Sig Start Date End Date Taking? Authorizing Provider  amiodarone (PACERONE) 200 MG tablet Take 1 tablet (200 mg total) by mouth daily. 09/25/16   Shaune Pollack, MD  aspirin EC 81 MG EC tablet Take 1 tablet (81 mg total) by mouth daily. 07/23/16   Alford Highland, MD  bisacodyl (DULCOLAX) 10 MG suppository Place 1 suppository (10 mg total) rectally daily as needed for moderate constipation. 10/27/16   Altamese Dilling, MD  BREO ELLIPTA 100-25 MCG/INH AEPB Inhale 1 puff into the lungs daily.  11/09/16   [provider]  cefTAZidime-D5W Elita Quick) 2 GM/50ML SOLR Inject 50 mLs (2 g total) into the vein Every Tuesday,Thursday,and Saturday with dialysis. 02/10/17 03/04/17  Houston Siren, MD  furosemide (LASIX) 80 MG tablet Take 1 tablet (80 mg total) by mouth 2 (two) times daily. 10/31/16   Enid Baas, MD  insulin aspart (NOVOLOG) 100 UNIT/ML injection Inject 4 Units into the skin 3 (three) times daily with meals. 10/31/16   Enid Baas, MD  methimazole (TAPAZOLE) 10 MG tablet Take 1 tablet (10 mg total) by mouth daily. 07/22/16   Alford Highland, MD  midodrine (PROAMATINE) 5 MG tablet On Tu, Thus, Sat 30 min before Hemodialysis 02/09/17   Houston Siren, MD  multivitamin (RENA-VIT) TABS tablet Take 1 tablet by mouth at bedtime. 01/19/17   Enedina Finner, MD  Nutritional Supplements (FEEDING SUPPLEMENT, NEPRO CARB STEADY,) LIQD Take 237 mLs by mouth 2 (two) times daily between meals. Patient not taking: Reported on 01/25/2017 10/27/16   Altamese Dilling, MD  pantoprazole (PROTONIX) 40 MG tablet Take 1 tablet (40 mg total) by  mouth daily. 09/25/16   Shaune Pollack, MD  traMADol (ULTRAM) 50 MG tablet Take 50 mg by mouth daily as needed.  01/09/17   [provider]  zolpidem (AMBIEN) 5 MG tablet Take 5 mg by mouth at bedtime as needed for sleep.  11/14/16   [provider]    Allergies  Allergen Reactions  . Sulfur Other (See Comments)    Pt states that this medication causes renal failure.      Family History  Problem Relation Age of Onset  . Coronary artery disease Father   . Pancreatic cancer Mother   . Breast cancer Sister   . Lung cancer Brother   . Cervical cancer Sister     Social History Social History  Substance Use Topics  . Smoking status: Never Smoker  . Smokeless tobacco: Never Used  . Alcohol use No    Review of Systems Constitutional: Negative for fever. Cardiovascular: Negative for chest pain. Respiratory: Positive for shortness of breath, which the patient states is due to abdominal distention. Gastrointestinal: Negative for abdominal "pain." Positive for abdominal tightness and fullness. Genitourinary: Negative for dysuria. Neurological: Negative for headache All other ROS negative  ____________________________________________   PHYSICAL EXAM:  VITAL SIGNS: ED Triage Vitals  Enc Vitals Group     BP 02/27/17 0821 128/87     Pulse Rate 02/27/17 0821 68     Resp --      Temp 02/27/17 0821 97.7 F (36.5 C)     Temp Source 02/27/17 0821 Oral     SpO2 02/27/17 0821 97 %     Weight 02/27/17 0821 196 lb (88.9 kg)     Height 02/27/17 0821 6\' 2"  (1.88 m)     Head Circumference --      Peak Flow --      Pain Score 02/27/17 0820 8     Pain Loc --      Pain Edu? --      Excl. in GC? --     Constitutional: Alert and oriented. Well appearing and in no distress. Eyes: Normal exam ENT   Head: Normocephalic and atraumatic.   Mouth/Throat: Mucous membranes are moist. Cardiovascular: Normal rate, regular rhythm. No murmur Respiratory: Normal respiratory  effort without tachypnea nor retractions. Breath sounds are clear Gastrointestinal: Moderately distended, dull percussion. Nontender. Most consistent with ascites. Musculoskeletal: Nontender with normal range of motion in all extremities.  Neurologic:  Normal speech and language. No gross focal neurologic deficits  Skin:  Skin is warm, dry and intact.  Psychiatric: Mood and affect are normal.  ____________________________________________    EKG  EKG reviewed and interpreted by myself shows sinus tachycardia 115 bpm, widened QRS, normal axis, prolonged QTC, nonspecific ST changes. No ST elevation.  ____________________________________________    RADIOLOGY  Successful ultrasound-guided paracentesis  ____________________________________________   INITIAL IMPRESSION / ASSESSMENT AND PLAN / ED COURSE  Pertinent labs & imaging results that were available during my care of the patient were reviewed by me and considered in my medical decision making (see chart for details).  Patient presents to the emergency department with abdominal fullness/tightness. He does not believe he can wait until June 4 for his paracentesis. Patient is also scheduled for dialysis today, has not had dialysis since Friday. We will check labs, discussed with radiology for possible ultrasound-guided paracentesis. If the paracentesis can be performed today we will discuss with the dialysis center to see if we can rearrange the patient's dialysis for later today after the paracentesis.  Radiology states they believe they will be able to work the patient into the schedule today but cannot give me a time line.  Patient was able to receive a paracentesis from the emergency department with interventional radiology. A total of 425 cc of ascites was removed. We are repeating a troponin as long as a repeat troponin is largely unchanged patient will be discharged to go to dialysis  today.  ____________________________________________   FINAL CLINICAL IMPRESSION(S) / ED DIAGNOSES  Ascites Dyspnea    Minna Antis, MD 02/27/17 1232

## 2017-02-27 NOTE — ED Notes (Signed)
NAD noted at time of D/C. Pt taken to the lobby via wheelchair by his daughter. Pt denies any comments/concerns at this time. Pt D/C to go to outpatient dialysis.

## 2017-02-27 NOTE — ED Notes (Signed)
Pt returned from US at this time.

## 2017-02-27 NOTE — ED Notes (Signed)
2nd troponin collected and pt given meal tray with MD permission. Pt visualized in NAD at this time.

## 2017-02-27 NOTE — ED Triage Notes (Signed)
Patient C/O difficulty breathing x 2 days.  Patient was seen by physician at dialysis on Thursday and was told that a paracentesis was scheduled outpatient for today.  Admitting called and only scheduled procedure for patient is on June 4th, paracentesis.

## 2017-02-27 NOTE — ED Notes (Signed)
Pt taken to US at this time

## 2017-03-02 NOTE — Discharge Instructions (Signed)
Paracentesis, Care After °Refer to this sheet in the next few weeks. These instructions provide you with information about caring for yourself after your procedure. Your health care provider may also give you more specific instructions. Your treatment has been planned according to current medical practices, but problems sometimes occur. Call your health care provider if you have any problems or questions after your procedure. °What can I expect after the procedure? °After your procedure, it is common to have a small amount of clear fluid coming from the puncture site. °Follow these instructions at home: °· Return to your normal activities as told by your health care provider. Ask your health care provider what activities are safe for you. °· Take over-the-counter and prescription medicines only as told by your health care provider. °· Do not take baths, swim, or use a hot tub until your health care provider approves. °· Follow instructions from your health care provider about: °? How to take care of your puncture site. °? When and how you should change your bandage (dressing). °? When you should remove your dressing. °· Check your puncture area every day signs of infection. Watch for: °? Redness, swelling, or pain. °? Fluid, blood, or pus. °· Keep all follow-up visits as told by your health care provider. This is important. °Contact a health care provider if: °· You have redness, swelling, or pain at your puncture site. °· You start to have more clear fluid coming from your puncture site. °· You have blood or pus coming from your puncture site. °· You have chills. °· You have a fever. °Get help right away if: °· You develop chest pain or shortness of breath. °· You develop increasing pain, discomfort, or swelling in your abdomen. °· You feel dizzy or light-headed or you pass out. °This information is not intended to replace advice given to you by your health care provider. Make sure you discuss any questions you  have with your health care provider. °Document Released: 02/02/2015 Document Revised: 02/24/2016 Document Reviewed: 12/01/2014 °Elsevier Interactive Patient Education © 2018 [employer] ° °

## 2017-03-05 ENCOUNTER — Ambulatory Visit
Admission: RE | Admit: 2017-03-05 | Discharge: 2017-03-05 | Disposition: A | Discharge status: Home / Self Care | Payer: BLUE CROSS/BLUE SHIELD | Source: Ambulatory Visit | Attending: Nephrology | Admitting: Nephrology

## 2017-03-07 ENCOUNTER — Encounter: Payer: Self-pay | Admitting: Medical Oncology

## 2017-03-07 ENCOUNTER — Inpatient Hospital Stay
Admission: EM | Admit: 2017-03-07 | Discharge: 2017-03-09 | DRG: 371 | Disposition: A | Discharge status: Home Health | Payer: BLUE CROSS/BLUE SHIELD | Attending: Internal Medicine | Admitting: Internal Medicine

## 2017-03-07 ENCOUNTER — Emergency Department: Payer: BLUE CROSS/BLUE SHIELD

## 2017-03-07 DIAGNOSIS — Z79899 Other long term (current) drug therapy: Secondary | ICD-10-CM

## 2017-03-07 DIAGNOSIS — N2581 Secondary hyperparathyroidism of renal origin: Secondary | ICD-10-CM | POA: Diagnosis present

## 2017-03-07 DIAGNOSIS — I4581 Long QT syndrome: Secondary | ICD-10-CM | POA: Diagnosis present

## 2017-03-07 DIAGNOSIS — F329 Major depressive disorder, single episode, unspecified: Secondary | ICD-10-CM | POA: Diagnosis present

## 2017-03-07 DIAGNOSIS — I5022 Chronic systolic (congestive) heart failure: Secondary | ICD-10-CM | POA: Diagnosis present

## 2017-03-07 DIAGNOSIS — R188 Other ascites: Secondary | ICD-10-CM | POA: Diagnosis present

## 2017-03-07 DIAGNOSIS — Z992 Dependence on renal dialysis: Secondary | ICD-10-CM

## 2017-03-07 DIAGNOSIS — N183 Chronic kidney disease, stage 3 (moderate): Secondary | ICD-10-CM | POA: Diagnosis present

## 2017-03-07 DIAGNOSIS — I2510 Atherosclerotic heart disease of native coronary artery without angina pectoris: Secondary | ICD-10-CM | POA: Diagnosis present

## 2017-03-07 DIAGNOSIS — K7460 Unspecified cirrhosis of liver: Secondary | ICD-10-CM | POA: Diagnosis present

## 2017-03-07 DIAGNOSIS — F419 Anxiety disorder, unspecified: Secondary | ICD-10-CM | POA: Diagnosis present

## 2017-03-07 DIAGNOSIS — Z882 Allergy status to sulfonamides status: Secondary | ICD-10-CM

## 2017-03-07 DIAGNOSIS — Z794 Long term (current) use of insulin: Secondary | ICD-10-CM

## 2017-03-07 DIAGNOSIS — E1142 Type 2 diabetes mellitus with diabetic polyneuropathy: Secondary | ICD-10-CM | POA: Diagnosis present

## 2017-03-07 DIAGNOSIS — E43 Unspecified severe protein-calorie malnutrition: Secondary | ICD-10-CM | POA: Diagnosis present

## 2017-03-07 DIAGNOSIS — N186 End stage renal disease: Secondary | ICD-10-CM | POA: Diagnosis present

## 2017-03-07 DIAGNOSIS — I4891 Unspecified atrial fibrillation: Secondary | ICD-10-CM | POA: Diagnosis present

## 2017-03-07 DIAGNOSIS — D631 Anemia in chronic kidney disease: Secondary | ICD-10-CM | POA: Diagnosis present

## 2017-03-07 DIAGNOSIS — Z86718 Personal history of other venous thrombosis and embolism: Secondary | ICD-10-CM

## 2017-03-07 DIAGNOSIS — I129 Hypertensive chronic kidney disease with stage 1 through stage 4 chronic kidney disease, or unspecified chronic kidney disease: Secondary | ICD-10-CM | POA: Diagnosis present

## 2017-03-07 DIAGNOSIS — I132 Hypertensive heart and chronic kidney disease with heart failure and with stage 5 chronic kidney disease, or end stage renal disease: Secondary | ICD-10-CM | POA: Diagnosis present

## 2017-03-07 DIAGNOSIS — E1122 Type 2 diabetes mellitus with diabetic chronic kidney disease: Secondary | ICD-10-CM | POA: Diagnosis present

## 2017-03-07 DIAGNOSIS — K219 Gastro-esophageal reflux disease without esophagitis: Secondary | ICD-10-CM | POA: Diagnosis present

## 2017-03-07 DIAGNOSIS — I255 Ischemic cardiomyopathy: Secondary | ICD-10-CM | POA: Diagnosis present

## 2017-03-07 DIAGNOSIS — J449 Chronic obstructive pulmonary disease, unspecified: Secondary | ICD-10-CM | POA: Diagnosis present

## 2017-03-07 DIAGNOSIS — Z7982 Long term (current) use of aspirin: Secondary | ICD-10-CM

## 2017-03-07 DIAGNOSIS — Z6820 Body mass index (BMI) 20.0-20.9, adult: Secondary | ICD-10-CM

## 2017-03-07 DIAGNOSIS — K652 Spontaneous bacterial peritonitis: Principal | ICD-10-CM | POA: Diagnosis present

## 2017-03-07 DIAGNOSIS — I252 Old myocardial infarction: Secondary | ICD-10-CM

## 2017-03-07 DIAGNOSIS — E0590 Thyrotoxicosis, unspecified without thyrotoxic crisis or storm: Secondary | ICD-10-CM | POA: Diagnosis present

## 2017-03-07 DIAGNOSIS — R109 Unspecified abdominal pain: Secondary | ICD-10-CM | POA: Diagnosis not present

## 2017-03-07 DIAGNOSIS — R140 Abdominal distension (gaseous): Secondary | ICD-10-CM

## 2017-03-07 LAB — CBC WITH DIFFERENTIAL/PLATELET
BASOS ABS: 0 10*3/uL (ref 0–0.1)
BASOS PCT: 1 %
EOS ABS: 0 10*3/uL (ref 0–0.7)
Eosinophils Relative: 1 %
HCT: 28.2 % — ABNORMAL LOW (ref 40.0–52.0)
HEMOGLOBIN: 8.7 g/dL — AB (ref 13.0–18.0)
Lymphocytes Relative: 15 %
Lymphs Abs: 1 10*3/uL (ref 1.0–3.6)
MCH: 26.2 pg (ref 26.0–34.0)
MCHC: 30.8 g/dL — AB (ref 32.0–36.0)
MCV: 84.9 fL (ref 80.0–100.0)
MONOS PCT: 12 %
Monocytes Absolute: 0.8 10*3/uL (ref 0.2–1.0)
NEUTROS PCT: 71 %
Neutro Abs: 4.9 10*3/uL (ref 1.4–6.5)
Platelets: 249 10*3/uL (ref 150–440)
RBC: 3.33 MIL/uL — ABNORMAL LOW (ref 4.40–5.90)
RDW: 24.3 % — AB (ref 11.5–14.5)
WBC: 6.8 10*3/uL (ref 3.8–10.6)

## 2017-03-07 LAB — COMPREHENSIVE METABOLIC PANEL
ALBUMIN: 2.9 g/dL — AB (ref 3.5–5.0)
ALT: 16 U/L — ABNORMAL LOW (ref 17–63)
ANION GAP: 13 (ref 5–15)
AST: 24 U/L (ref 15–41)
Alkaline Phosphatase: 57 U/L (ref 38–126)
BUN: 41 mg/dL — AB (ref 6–20)
CO2: 23 mmol/L (ref 22–32)
Calcium: 9.1 mg/dL (ref 8.9–10.3)
Chloride: 97 mmol/L — ABNORMAL LOW (ref 101–111)
Creatinine, Ser: 4.58 mg/dL — ABNORMAL HIGH (ref 0.61–1.24)
GFR calc Af Amer: 16 mL/min — ABNORMAL LOW (ref >60)
GFR calc non Af Amer: 14 mL/min — ABNORMAL LOW (ref >60)
GLUCOSE: 175 mg/dL — AB (ref 65–99)
POTASSIUM: 4.9 mmol/L (ref 3.5–5.1)
SODIUM: 133 mmol/L — AB (ref 135–145)
Total Bilirubin: 1.8 mg/dL — ABNORMAL HIGH (ref 0.3–1.2)
Total Protein: 7.2 g/dL (ref 6.5–8.1)

## 2017-03-07 LAB — LIPASE, BLOOD: Lipase: 17 U/L (ref 11–51)

## 2017-03-07 LAB — GLUCOSE, CAPILLARY: Glucose-Capillary: 147 mg/dL — ABNORMAL HIGH (ref 65–99)

## 2017-03-07 LAB — AMMONIA: AMMONIA: 21 umol/L (ref 9–35)

## 2017-03-07 LAB — TROPONIN I: Troponin I: 0.03 ng/mL (ref <0.03)

## 2017-03-07 MED ORDER — DEXTROSE 5 % IV SOLN
1.0000 g | INTRAVENOUS | Status: DC
  Filled 2017-03-07: qty 10

## 2017-03-07 MED ORDER — LIDOCAINE HCL (PF) 1 % IJ SOLN
INTRAMUSCULAR | Status: AC
  Administered 2017-03-07: 5 mL
  Filled 2017-03-07: qty 5

## 2017-03-07 MED ORDER — ONDANSETRON HCL 4 MG/2ML IJ SOLN: 4.0000 mg | Freq: Four times a day (QID) | INTRAMUSCULAR | Status: DC | PRN

## 2017-03-07 MED ORDER — ZOLPIDEM TARTRATE 5 MG PO TABS
5.0000 mg | ORAL_TABLET | Freq: Every evening | ORAL | Status: DC | PRN
  Administered 2017-03-08: 5 mg via ORAL
  Filled 2017-03-07: qty 1

## 2017-03-07 MED ORDER — OXYCODONE HCL 5 MG PO TABS
5.0000 mg | ORAL_TABLET | ORAL | Status: DC | PRN
  Administered 2017-03-07 – 2017-03-08 (×3): 5 mg via ORAL
  Filled 2017-03-07 (×3): qty 1

## 2017-03-07 MED ORDER — SENNOSIDES-DOCUSATE SODIUM 8.6-50 MG PO TABS: 1.0000 | ORAL_TABLET | Freq: Every evening | ORAL | Status: DC | PRN

## 2017-03-07 MED ORDER — ONDANSETRON HCL 4 MG PO TABS: 4.0000 mg | ORAL_TABLET | Freq: Four times a day (QID) | ORAL | Status: DC | PRN

## 2017-03-07 MED ORDER — METHIMAZOLE 10 MG PO TABS
10.0000 mg | ORAL_TABLET | Freq: Every day | ORAL | Status: DC
  Administered 2017-03-08 – 2017-03-09 (×2): 10 mg via ORAL
  Filled 2017-03-07 (×2): qty 1

## 2017-03-07 MED ORDER — PANTOPRAZOLE SODIUM 40 MG PO TBEC
40.0000 mg | DELAYED_RELEASE_TABLET | Freq: Every day | ORAL | Status: DC
Start: 2017-03-08 — End: 2017-03-09
  Administered 2017-03-08 – 2017-03-09 (×2): 40 mg via ORAL
  Filled 2017-03-07 (×2): qty 1

## 2017-03-07 MED ORDER — ASPIRIN EC 81 MG PO TBEC
81.0000 mg | DELAYED_RELEASE_TABLET | Freq: Every day | ORAL | Status: DC
  Administered 2017-03-08 – 2017-03-09 (×2): 81 mg via ORAL
  Filled 2017-03-07 (×2): qty 1

## 2017-03-07 MED ORDER — MAGNESIUM CITRATE PO SOLN
1.0000 | Freq: Once | ORAL | Status: DC | PRN
  Filled 2017-03-07: qty 296

## 2017-03-07 MED ORDER — IPRATROPIUM BROMIDE 0.02 % IN SOLN: 0.5000 mg | Freq: Four times a day (QID) | RESPIRATORY_TRACT | Status: DC | PRN

## 2017-03-07 MED ORDER — INSULIN ASPART 100 UNIT/ML ~~LOC~~ SOLN: 0.0000 [IU] | Freq: Every day | SUBCUTANEOUS | Status: DC

## 2017-03-07 MED ORDER — SERTRALINE HCL 50 MG PO TABS
50.0000 mg | ORAL_TABLET | Freq: Every day | ORAL | Status: DC
Start: 2017-03-08 — End: 2017-03-09
  Administered 2017-03-08 – 2017-03-09 (×2): 50 mg via ORAL
  Filled 2017-03-07 (×2): qty 1

## 2017-03-07 MED ORDER — ALBUTEROL SULFATE (2.5 MG/3ML) 0.083% IN NEBU: 2.5000 mg | INHALATION_SOLUTION | Freq: Four times a day (QID) | RESPIRATORY_TRACT | Status: DC | PRN

## 2017-03-07 MED ORDER — CEFTRIAXONE SODIUM 2 G IJ SOLR
2.0000 g | Freq: Once | INTRAMUSCULAR | Status: AC
  Administered 2017-03-07: 2 g via INTRAVENOUS
  Filled 2017-03-07 (×2): qty 2

## 2017-03-07 MED ORDER — AMIODARONE HCL 200 MG PO TABS
200.0000 mg | ORAL_TABLET | Freq: Every day | ORAL | Status: DC
Start: 2017-03-08 — End: 2017-03-09
  Administered 2017-03-08 – 2017-03-09 (×2): 200 mg via ORAL
  Filled 2017-03-07 (×2): qty 1

## 2017-03-07 MED ORDER — ACETAMINOPHEN 650 MG RE SUPP: 650.0000 mg | Freq: Four times a day (QID) | RECTAL | Status: DC | PRN

## 2017-03-07 MED ORDER — FLUTICASONE FUROATE-VILANTEROL 100-25 MCG/INH IN AEPB: 1.0000 | INHALATION_SPRAY | Freq: Every day | RESPIRATORY_TRACT | Status: DC

## 2017-03-07 MED ORDER — INSULIN ASPART 100 UNIT/ML ~~LOC~~ SOLN
0.0000 [IU] | Freq: Three times a day (TID) | SUBCUTANEOUS | Status: DC
  Administered 2017-03-08: 2 [IU] via SUBCUTANEOUS
  Administered 2017-03-09: 3 [IU] via SUBCUTANEOUS
  Filled 2017-03-07: qty 3
  Filled 2017-03-07: qty 2

## 2017-03-07 MED ORDER — MIDODRINE HCL 5 MG PO TABS
5.0000 mg | ORAL_TABLET | ORAL | Status: DC
  Administered 2017-03-08: 5 mg via ORAL
  Filled 2017-03-07: qty 1

## 2017-03-07 MED ORDER — DEXTROSE 5 % IV SOLN
2.0000 g | Freq: Once | INTRAVENOUS | Status: DC
  Administered 2017-03-07: 2 g via INTRAVENOUS
  Filled 2017-03-07: qty 2

## 2017-03-07 MED ORDER — SODIUM CHLORIDE 0.9% FLUSH: 3.0000 mL | INTRAVENOUS | Status: DC | PRN

## 2017-03-07 MED ORDER — BISACODYL 5 MG PO TBEC: 5.0000 mg | DELAYED_RELEASE_TABLET | Freq: Every day | ORAL | Status: DC | PRN

## 2017-03-07 MED ORDER — SODIUM CHLORIDE 0.9 % IV SOLN: 250.0000 mL | INTRAVENOUS | Status: DC | PRN

## 2017-03-07 MED ORDER — HEPARIN SODIUM (PORCINE) 5000 UNIT/ML IJ SOLN
5000.0000 [IU] | Freq: Three times a day (TID) | INTRAMUSCULAR | Status: DC
Start: 2017-03-07 — End: 2017-03-09
  Administered 2017-03-08 – 2017-03-09 (×4): 5000 [IU] via SUBCUTANEOUS
  Filled 2017-03-07 (×4): qty 1

## 2017-03-07 MED ORDER — RENA-VITE PO TABS
1.0000 | ORAL_TABLET | Freq: Every day | ORAL | Status: DC
  Administered 2017-03-08 (×2): 1 via ORAL
  Filled 2017-03-07 (×2): qty 1

## 2017-03-07 MED ORDER — ACETAMINOPHEN 325 MG PO TABS: 650.0000 mg | ORAL_TABLET | Freq: Four times a day (QID) | ORAL | Status: DC | PRN

## 2017-03-07 MED ORDER — SODIUM CHLORIDE 0.9% FLUSH
3.0000 mL | Freq: Two times a day (BID) | INTRAVENOUS | Status: DC
  Administered 2017-03-08 – 2017-03-09 (×3): 3 mL via INTRAVENOUS

## 2017-03-07 NOTE — ED Provider Notes (Signed)
[facility] Emergency Department Provider Note  ____________________________________________  Time seen: Approximately 7:20 PM  I have reviewed the triage vital signs and the nursing notes.   HISTORY  Chief Complaint Emesis and Abdominal Pain   HPI Juan BlossomWaltzie L Kitchens Jr. is a 46 y.o. male with h/o cirrhosis c/p multiple episodes of SBP, CHF with EF 20%, ESRD on HD (TTS), HTN who presents for evaluation of abdominal pain. Patient reports this pain started overnight is diffuse, moderate, dull, associated with multiple episodes of nonbloody nonbilious emesis and severe nausea. The pain is identical to prior episodes of SBP. No diarrhea. He is been constipated for the last 5 days. Is passing flatus. No prior abdominal surgeries. No prior history of SBO. He denies dysuria hematuria. No fever or chills. No chest pain or shortness of breath, no URI symptoms.  Past Medical History:  Diagnosis Date  . Cellulitis and abscess of foot    Left-Dr. Ether GriffinsFowler  . CHF (congestive heart failure) (HCC)    EF 20%  . CKD (chronic kidney disease), stage III   . Diabetes mellitus without complication (HCC)    a. Dx ~ 1996.  [name]. Dysrhythmia   . Essential hypertension   . Gastritis    a. 04/2015 hematemesis -> EGD: gastritis, esophagitis, duodenitis.  No active bleeding.  PPI added.  [name]. GERD (gastroesophageal reflux disease)   . Left leg DVT (HCC)    a. Dx 05/2015 -> Coumadin.  . MI (myocardial infarction) (HCC)   . Osteomyelitis (HCC)    a. 05/2015 L foot.    Patient Active Problem List   Diagnosis Date Noted  . Pleural effusion 01/25/2017  . CHF exacerbation (HCC) 01/14/2017  . Acute on chronic renal failure (HCC) 01/09/2017  . SBP (spontaneous bacterial peritonitis) (HCC) 12/05/2016  . Hyperglycemia 10/28/2016  . Sepsis (HCC) 10/17/2016  . Chest pain 09/24/2016  . Acute-on-chronic kidney injury (HCC) 09/24/2016    Past Surgical History:  Procedure Laterality Date  .  CARDIAC CATHETERIZATION Right 10/26/2016   Procedure: Left Heart Cath and Coronary Angiography;  Surgeon: Laurier NancyShaukat A Khan, MD;  Location: [facility] INVASIVE CV LAB;  Service: Cardiovascular;  Laterality: Right;  . DIALYSIS/PERMA CATHETER INSERTION N/A 01/17/2017   Procedure: Dialysis/Perma Catheter Insertion;  Surgeon: Annice NeedyJason S Dew, MD;  Location: [facility] INVASIVE CV LAB;  Service: Cardiovascular;  Laterality: N/A;  . DIALYSIS/PERMA CATHETER REMOVAL N/A 12/05/2016   Procedure: Dialysis/Perma Catheter Removal;  Surgeon: Renford DillsGregory G Schnier, MD;  Location: [facility] INVASIVE CV LAB;  Service: Cardiovascular;  Laterality: N/A;  . ESOPHAGOGASTRODUODENOSCOPY N/A 04/07/2015   Procedure: ESOPHAGOGASTRODUODENOSCOPY (EGD);  Surgeon: Midge Miniumarren Wohl, MD;  [facility];  Service: Endoscopy;  Laterality: N/A;  . ESOPHAGOGASTRODUODENOSCOPY (EGD) WITH PROPOFOL Left 06/30/2015   Procedure: ESOPHAGOGASTRODUODENOSCOPY (EGD) WITH PROPOFOL;  Surgeon: Christena DeemMartin U Skulskie, MD;  [facility];  Service: Endoscopy;  Laterality: Left;  . FOOT SURGERY    . I&D EXTREMITY Left 04/06/2015   Procedure: IRRIGATION AND DEBRIDEMENT EXTREMITY;  Surgeon: Gwyneth RevelsJustin Fowler, DPM;  Location: [facility] ORS;  Service: Podiatry;  Laterality: Left;  . IRRIGATION AND DEBRIDEMENT FOOT Left 05/21/2015   Procedure: IRRIGATION AND DEBRIDEMENT FOOT;  Surgeon: Linus Galasodd Cline, MD;  Location: [facility] ORS;  Service: Podiatry;  Laterality: Left;  . IRRIGATION AND DEBRIDEMENT KNEE Right 10/18/2016   Procedure: IRRIGATION AND DEBRIDEMENT KNEE;  Surgeon: Deeann SaintHoward Miller, MD;  Location: [facility] ORS;  Service: Orthopedics;  Laterality: Right;  . KNEE ARTHROSCOPY  10/18/2016   Procedure: ARTHROSCOPY KNEE;  Surgeon: Dimas AguasHoward  Hyacinth Meeker, MD;  Location: [facility] ORS;  Service: Orthopedics;;  . PERIPHERAL VASCULAR CATHETERIZATION N/A 10/27/2016   Procedure: Dialysis/Perma Catheter Insertion;  Surgeon: Annice Needy, MD;  Location: [facility] INVASIVE CV LAB;  Service: Cardiovascular;  Laterality: N/A;     Prior to Admission medications   Medication Sig Start Date End Date Taking? Authorizing Provider  amiodarone (PACERONE) 200 MG tablet Take 1 tablet (200 mg total) by mouth daily. 09/25/16  Yes Shaune Pollack, MD  aspirin EC 81 MG EC tablet Take 1 tablet (81 mg total) by mouth daily. 07/23/16  Yes Wieting, Richard, MD  bisacodyl (DULCOLAX) 10 MG suppository Place 1 suppository (10 mg total) rectally daily as needed for moderate constipation. 10/27/16  Yes Altamese Dilling, MD  fluticasone furoate-vilanterol (BREO ELLIPTA) 100-25 MCG/INH AEPB Inhale 1 puff into the lungs daily.   Yes [provider]  insulin aspart (NOVOLOG) 100 UNIT/ML injection Inject 4 Units into the skin 3 (three) times daily with meals. 10/31/16  Yes Enid Baas, MD  methimazole (TAPAZOLE) 10 MG tablet Take 1 tablet (10 mg total) by mouth daily. 07/22/16  Yes Alford Highland, MD  midodrine (PROAMATINE) 5 MG tablet On Tu, Thus, Sat 30 min before Hemodialysis Patient taking differently: Take 5 mg by mouth Every Tuesday,Thursday,and Saturday with dialysis. 30 minutes before hemodialysis 02/09/17  Yes Sainani, Rolly Pancake, MD  multivitamin (RENA-VIT) TABS tablet Take 1 tablet by mouth at bedtime. 01/19/17  Yes Enedina Finner, MD  pantoprazole (PROTONIX) 40 MG tablet Take 1 tablet (40 mg total) by mouth daily. 09/25/16  Yes Shaune Pollack, MD  senna-docusate (SENOKOT-S) 8.6-50 MG tablet Take 1 tablet by mouth at bedtime.   Yes [provider]  sertraline (ZOLOFT) 50 MG tablet Take 50 mg by mouth daily.   Yes [provider]  zolpidem (AMBIEN) 5 MG tablet Take 5 mg by mouth at bedtime as needed for sleep.  11/14/16  Yes [provider]    Allergies Sulfur  Family History  Problem Relation Age of Onset  . Coronary artery disease Father   . Pancreatic cancer Mother   . Breast cancer Sister   . Lung cancer Brother   . Cervical cancer Sister     Social History Social History  Substance Use  Topics  . Smoking status: Never Smoker  . Smokeless tobacco: Never Used  . Alcohol use No    Review of Systems  Constitutional: Negative for fever. Eyes: Negative for visual changes. ENT: Negative for sore throat. Neck: No neck pain  Cardiovascular: Negative for chest pain. Respiratory: Negative for shortness of breath. Gastrointestinal: + diffuse abdominal pain, nausea, and vomiting. No diarrhea. Genitourinary: Negative for dysuria. Musculoskeletal: Negative for back pain. Skin: Negative for rash. Neurological: Negative for headaches, weakness or numbness. Psych: No SI or HI  ____________________________________________   PHYSICAL EXAM:  VITAL SIGNS: ED Triage Vitals [03/07/17 1811]  Enc Vitals Group     BP 114/82     Pulse Rate (!) 107     Resp 18     Temp 98.1 F (36.7 C)     Temp Source Oral     SpO2 100 %     Weight 196 lb (88.9 kg)     Height 6\' 2"  (1.88 m)     Head Circumference      Peak Flow      Pain Score 10     Pain Loc      Pain Edu?      Excl. in GC?  Constitutional: Alert and oriented. Well appearing and in no apparent distress. HEENT:      Head: Normocephalic and atraumatic.         Eyes: Conjunctivae are normal. Sclera is non-icteric.       Mouth/Throat: Mucous membranes are moist.       Neck: Supple with no signs of meningismus. Cardiovascular: Tachycardic with regular rhythm. No murmurs, gallops, or rubs. 2+ symmetrical distal pulses are present in all extremities. No JVD. Respiratory: Normal respiratory effort. Lungs are clear to auscultation bilaterally. No wheezes, crackles, or rhonchi.  Gastrointestinal: Soft, mildly distended with diffuse tenderness throughout, no rebound or guarding  Musculoskeletal: Nontender with normal range of motion in all extremities. No edema, cyanosis, or erythema of extremities. Neurologic: Normal speech and language. Face is symmetric. Moving all extremities. No gross focal neurologic deficits are  appreciated. Skin: Skin is warm, dry and intact. No rash noted. Psychiatric: Mood and affect are normal. Speech and behavior are normal.  ____________________________________________   LABS (all labs ordered are listed, but only abnormal results are displayed)  Labs Reviewed  CBC WITH DIFFERENTIAL/PLATELET - Abnormal; Notable for the following:       Result Value   RBC 3.33 (*)    Hemoglobin 8.7 (*)    HCT 28.2 (*)    MCHC 30.8 (*)    RDW 24.3 (*)    All other components within normal limits  COMPREHENSIVE METABOLIC PANEL - Abnormal; Notable for the following:    Sodium 133 (*)    Chloride 97 (*)    Glucose, Bld 175 (*)    BUN 41 (*)    Creatinine, Ser 4.58 (*)    Albumin 2.9 (*)    ALT 16 (*)    Total Bilirubin 1.8 (*)    GFR calc non Af Amer 14 (*)    GFR calc Af Amer 16 (*)    All other components within normal limits  TROPONIN I - Abnormal; Notable for the following:    Troponin I 0.03 (*)    All other components within normal limits  LIPASE, BLOOD  AMMONIA  BASIC METABOLIC PANEL  CBC   ____________________________________________  EKG  ED ECG REPORT I, Nita Sickle, the attending physician, personally viewed and interpreted this ECG.  Junctional tachycardia, rate of 105, prolonged QTC, left axis deviation, no ST elevations or depressions. EKG is unchanged from prior. ____________________________________________  RADIOLOGY  Ct a/p: 1. Moderate though smaller appearing loculated right pleural effusion with adjacent atelectasis. 2. Large volume of ascites measuring 19.3 x 21.2 x 3.6 cm occupying much the left hemiabdomen and pelvis displacing adjacent bowel loops. This has significantly increased from comparison CT. This appears somewhat loculated in appearance and sequela of a peritonitis is not entirely excluded given stranding of the mesentery with edema. No bowel obstruction or inflammation. 3. Cirrhotic appearance of the liver without  space-occupying mass. 4. Soft tissue anasarca. ____________________________________________   PROCEDURES  Procedure(s) performed: None Procedures Critical Care performed:  None ____________________________________________   INITIAL IMPRESSION / ASSESSMENT AND PLAN / ED COURSE  46 y.o. male with h/o cirrhosis c/p multiple episodes of SBP, CHF with EF 20%, ESRD on HD (TTS), HTN who presents for evaluation of diffuse abdominal pain, nausea and vomiting. Patient is afebrile, mildly tachycardic with heart rate of 107, has diffuse tenderness to palpation. Bedside ultrasound showing moderate amount of ascites. Unfortunately I did 2 attempts on a diagnostic paracentesis which were unsuccessful. We'll send patient for CT abdomen and pelvis to rule  out any other possible causes of his abdominal pain and if the CT is negative we'll treat for SBP with IV antibiotics. I discussed with the interventional radiology on call however they're unable to do a paracentesis this evening but will be happy to do it tomorrow morning. Anticipate admission to the hospitalist service.     Pertinent labs & imaging results that were available during my care of the patient were reviewed by me and considered in my medical decision making (see chart for details).    ____________________________________________   FINAL CLINICAL IMPRESSION(S) / ED DIAGNOSES  Final diagnoses:  SBP (spontaneous bacterial peritonitis) (HCC)      NEW MEDICATIONS STARTED DURING THIS VISIT:  Current Discharge Medication List       Note:  This document was prepared using Dragon voice recognition software and may include unintentional dictation errors.    Nita Sickle, MD 03/07/17 [phone]

## 2017-03-07 NOTE — ED Triage Notes (Signed)
Pt reports that [facility] and was supposed to have eye surgery, when being given the anesthesia pt cardiac arrested. Pt was discharged yesterday and since then he has been having central abd pain with vomiting and constipation. Pt reports he was supposed to have paracentecesis yesterday also but did not.

## 2017-03-07 NOTE — Progress Notes (Signed)
Pharmacy Antibiotic Note  Juan BlossomWaltzie L Damaso Jr. is a 46 y.o. male admitted on 03/07/2017 with SBP prophylaxis.  Pharmacy has been consulted for ceftriaxone dosing.  Plan: Will start ceftriaxone 1g IV daily for 7 days for SBP prophylaxis  Height: 6\' 2"  (188 cm) Weight: 173 lb 4.8 oz (78.6 kg) IBW/kg (Calculated) : 82.2  Temp (24hrs), Avg:98.2 F (36.8 C), Min:98.1 F (36.7 C), Max:98.2 F (36.8 C)   Recent Labs Lab 03/07/17 1820  WBC 6.8  CREATININE 4.58*    Estimated Creatinine Clearance: 21.9 mL/min (A) (by C-G formula based on SCr of 4.58 mg/dL (H)).    Allergies  Allergen Reactions  . Sulfur Other (See Comments)    Pt states that this medication causes renal failure.       Thank you for allowing pharmacy to be a part of this patient's care.  Thomasene Rippleavid Zanden Colver, PharmD, BCPS Clinical Pharmacist 03/07/2017

## 2017-03-07 NOTE — ED Notes (Signed)
Attempted to give report 

## 2017-03-07 NOTE — H&P (Signed)
History and Physical   [facility] - [place] [facility] Admission History and Physical [employer] Olivia Pavelko, D.O.    Patient Name: Juan Roberson MR#: [id number] Date of Birth: 03/04/1971 Date of Admission: 03/07/2017  Referring MD/NP/PA: Dr. Don PerkingVeronese Primary Care Physician: Corky DownsMasoud, Javed, MD Patient coming from: Home  Chief Complaint:  Chief Complaint  Patient presents with  . Emesis  . Abdominal Pain    HPI: Juan Roberson is a 46 y.o. male with a known history of chronic systolic heart failure, ischemic cardiomyopathy with EF 10%, ESRD on HD, DM, HTN, GERD, DVT, MI, COPD, afib, recent cardiac arrest, multiple admissions for SBP presents to the emergency department for evaluation of abdominal pain.  Patient was in a usual state of health until Yesterday when he developed diffuse abdominal pain associated with vomiting and constipation. Symptoms are consistent with previous episodes of SBP. Emergency department physician attempted to tap his ascites with ultrasound guidance however was unsuccessful due to fluid loculations. Patient received prophylactic Rocephin for treatment of presumed SBP.  [facility] one week ago for planned eye surgery but sustained a cardiac arrest during anesthesia. Patient was admitted here on month ago for SBP as well.   Patient denies fevers/chills, weakness, dizziness, chest pain, shortness of breath, dysuria/frequency, changes in mental status.   Review of Systems:  CONSTITUTIONAL: No fever/chills, fatigue, weakness, weight gain/loss, headache. EYES: No blurry or double vision. ENT: No tinnitus, postnasal drip, redness or soreness of the oropharynx. RESPIRATORY: No cough, dyspnea, wheeze.  No hemoptysis.  CARDIOVASCULAR: No chest pain, palpitations, syncope, orthopnea. No lower extremity edema.  GASTROINTESTINAL: Positive nausea, vomiting, abdominal pain, constipation.  Negative diarrhea. No hematemesis, melena or  hematochezia. GENITOURINARY: No dysuria, frequency, hematuria. ENDOCRINE: No polyuria or nocturia. No heat or cold intolerance. HEMATOLOGY: No anemia, bruising, bleeding. INTEGUMENTARY: No rashes, ulcers, lesions. MUSCULOSKELETAL: No arthritis, gout, dyspnea. NEUROLOGIC: No numbness, tingling, ataxia, seizure-type activity, weakness. PSYCHIATRIC: No anxiety, depression, insomnia.   Past Medical History:  Diagnosis Date  . Cellulitis and abscess of foot    Left-Dr. Ether GriffinsFowler  . CHF (congestive heart failure) (HCC)    EF 20%  . CKD (chronic kidney disease), stage III   . Diabetes mellitus without complication (HCC)    a. Dx ~ 1996.  [name]. Dysrhythmia   . Essential hypertension   . Gastritis    a. 04/2015 hematemesis -> EGD: gastritis, esophagitis, duodenitis.  No active bleeding.  PPI added.  [name]. GERD (gastroesophageal reflux disease)   . Left leg DVT (HCC)    a. Dx 05/2015 -> Coumadin.  . MI (myocardial infarction) (HCC)   . Osteomyelitis (HCC)    a. 05/2015 L foot.    Past Surgical History:  Procedure Laterality Date  . CARDIAC CATHETERIZATION Right 10/26/2016   Procedure: Left Heart Cath and Coronary Angiography;  Surgeon: Laurier NancyShaukat A Khan, MD;  Location: [facility] INVASIVE CV LAB;  Service: Cardiovascular;  Laterality: Right;  . DIALYSIS/PERMA CATHETER INSERTION N/A 01/17/2017   Procedure: Dialysis/Perma Catheter Insertion;  Surgeon: Annice NeedyJason S Dew, MD;  Location: [facility] INVASIVE CV LAB;  Service: Cardiovascular;  Laterality: N/A;  . DIALYSIS/PERMA CATHETER REMOVAL N/A 12/05/2016   Procedure: Dialysis/Perma Catheter Removal;  Surgeon: Renford DillsGregory G Schnier, MD;  Location: [facility] INVASIVE CV LAB;  Service: Cardiovascular;  Laterality: N/A;  . ESOPHAGOGASTRODUODENOSCOPY N/A 04/07/2015   Procedure: ESOPHAGOGASTRODUODENOSCOPY (EGD);  Surgeon: Midge Miniumarren Wohl, MD;  [facility];  Service: Endoscopy;  Laterality: N/A;  . ESOPHAGOGASTRODUODENOSCOPY (EGD) WITH PROPOFOL Left 06/30/2015  Procedure:  ESOPHAGOGASTRODUODENOSCOPY (EGD) WITH PROPOFOL;  Surgeon: Christena Deem, MD;  [facility];  Service: Endoscopy;  Laterality: Left;  . FOOT SURGERY    . I&D EXTREMITY Left 04/06/2015   Procedure: IRRIGATION AND DEBRIDEMENT EXTREMITY;  Surgeon: Gwyneth Revels, DPM;  Location: [facility] ORS;  Service: Podiatry;  Laterality: Left;  . IRRIGATION AND DEBRIDEMENT FOOT Left 05/21/2015   Procedure: IRRIGATION AND DEBRIDEMENT FOOT;  Surgeon: Linus Galas, MD;  Location: [facility] ORS;  Service: Podiatry;  Laterality: Left;  . IRRIGATION AND DEBRIDEMENT KNEE Right 10/18/2016   Procedure: IRRIGATION AND DEBRIDEMENT KNEE;  Surgeon: Deeann Saint, MD;  Location: [facility] ORS;  Service: Orthopedics;  Laterality: Right;  . KNEE ARTHROSCOPY  10/18/2016   Procedure: ARTHROSCOPY KNEE;  Surgeon: Deeann Saint, MD;  Location: [facility] ORS;  Service: Orthopedics;;  . PERIPHERAL VASCULAR CATHETERIZATION N/A 10/27/2016   Procedure: Dialysis/Perma Catheter Insertion;  Surgeon: Annice Needy, MD;  Location: [facility] INVASIVE CV LAB;  Service: Cardiovascular;  Laterality: N/A;     reports that he has never smoked. He has never used smokeless tobacco. He reports that he does not drink alcohol or use drugs.  Allergies  Allergen Reactions  . Sulfur Other (See Comments)    Pt states that this medication causes renal failure.      Family History  Problem Relation Age of Onset  . Coronary artery disease Father   . Pancreatic cancer Mother   . Breast cancer Sister   . Lung cancer Brother   . Cervical cancer Sister     Prior to Admission medications   Medication Sig Start Date End Date Taking? Authorizing Provider  amiodarone (PACERONE) 200 MG tablet Take 1 tablet (200 mg total) by mouth daily. 09/25/16   Shaune Pollack, MD  aspirin EC 81 MG EC tablet Take 1 tablet (81 mg total) by mouth daily. 07/23/16   Alford Highland, MD  bisacodyl (DULCOLAX) 10 MG suppository Place 1 suppository (10 mg total) rectally daily as needed for moderate  constipation. 10/27/16   Altamese Dilling, MD  BREO ELLIPTA 100-25 MCG/INH AEPB Inhale 1 puff into the lungs daily.  11/09/16   [provider]  furosemide (LASIX) 80 MG tablet Take 1 tablet (80 mg total) by mouth 2 (two) times daily. 10/31/16   Enid Baas, MD  insulin aspart (NOVOLOG) 100 UNIT/ML injection Inject 4 Units into the skin 3 (three) times daily with meals. 10/31/16   Enid Baas, MD  methimazole (TAPAZOLE) 10 MG tablet Take 1 tablet (10 mg total) by mouth daily. 07/22/16   Alford Highland, MD  midodrine (PROAMATINE) 5 MG tablet On Tu, Thus, Sat 30 min before Hemodialysis 02/09/17   Houston Siren, MD  multivitamin (RENA-VIT) TABS tablet Take 1 tablet by mouth at bedtime. 01/19/17   Enedina Finner, MD  Nutritional Supplements (FEEDING SUPPLEMENT, NEPRO CARB STEADY,) LIQD Take 237 mLs by mouth 2 (two) times daily between meals. Patient not taking: Reported on 01/25/2017 10/27/16   Altamese Dilling, MD  pantoprazole (PROTONIX) 40 MG tablet Take 1 tablet (40 mg total) by mouth daily. 09/25/16   Shaune Pollack, MD  traMADol (ULTRAM) 50 MG tablet Take 50 mg by mouth daily as needed.  01/09/17   [provider]  zolpidem (AMBIEN) 5 MG tablet Take 5 mg by mouth at bedtime as needed for sleep.  11/14/16   [provider]    Physical Exam: Vitals:   03/07/17 1811 03/07/17 1830  BP: 114/82 105/88  Pulse: (!) 107   Resp:  18 (!) 25  Temp: 98.1 F (36.7 C)   TempSrc: Oral   SpO2: 100%   Weight: 88.9 kg (196 lb)   Height: 6\' 2"  (1.88 m)     GENERAL: 46 y.o.-year-old Chronically ill-appearing male patient, no acute distress.  Pleasant and cooperative.   HEENT: Head atraumatic, normocephalic. Pupils equal, round, reactive to light and accommodation. No scleral icterus.  Mucus membranes moist. NECK: Supple, full range of motion.  CHEST: Normal breath sounds bilaterally. No wheezing, rales, rhonchi or crackles. No use of accessory muscles of  respiration.  No reproducible chest wall tenderness.  CARDIOVASCULAR: S1, S2 normal. No murmurs, rubs, or gallops. Cap refill <2 seconds. Pulses intact distally.  ABDOMEN: Soft, distended, diffusely tender.  No fluid wave.  No rebound, guarding, rigidity. Normoactive bowel sounds present in all four quadrants. No organomegaly or mass. EXTREMITIES: Mild pedal edema, cyanosis, or clubbing. No calf tenderness or Homan's sign.  NEUROLOGIC: The patient is alert and oriented x 3. Cranial nerves II through XII are grossly intact with no focal sensorimotor deficit. Muscle strength 5/5 in all extremities. Sensation intact. Gait not checked. PSYCHIATRIC:  Normal affect, mood, thought content. SKIN: Warm, dry, and intact without obvious rash, lesion, or ulcer.    Labs on Admission:  CBC:  Recent Labs Lab 03/07/17 1820  WBC 6.8  NEUTROABS 4.9  HGB 8.7*  HCT 28.2*  MCV 84.9  PLT 249   Basic Metabolic Panel:  Recent Labs Lab 03/07/17 1820  NA 133*  K 4.9  CL 97*  CO2 23  GLUCOSE 175*  BUN 41*  CREATININE 4.58*  CALCIUM 9.1   GFR: Estimated Creatinine Clearance: 22.9 mL/min (A) (by C-G formula based on SCr of 4.58 mg/dL (H)). Liver Function Tests:  Recent Labs Lab 03/07/17 1820  AST 24  ALT 16*  ALKPHOS 57  BILITOT 1.8*  PROT 7.2  ALBUMIN 2.9*    Recent Labs Lab 03/07/17 1820  LIPASE 17    Recent Labs Lab 03/07/17 1820  AMMONIA 21   Coagulation Profile: No results for input(s): INR, PROTIME in the last 168 hours. Cardiac Enzymes:  Recent Labs Lab 03/07/17 1820  TROPONINI 0.03*   BNP (last 3 results) No results for input(s): PROBNP in the last 8760 hours. HbA1C: No results for input(s): HGBA1C in the last 72 hours. CBG: No results for input(s): GLUCAP in the last 168 hours. Lipid Profile: No results for input(s): CHOL, HDL, LDLCALC, TRIG, CHOLHDL, LDLDIRECT in the last 72 hours. Thyroid Function Tests: No results for input(s): TSH, T4TOTAL, FREET4,  T3FREE, THYROIDAB in the last 72 hours. Anemia Panel: No results for input(s): VITAMINB12, FOLATE, FERRITIN, TIBC, IRON, RETICCTPCT in the last 72 hours. Urine analysis:    Component Value Date/Time   COLORURINE AMBER (A) 01/25/2017 0854   APPEARANCEUR HAZY (A) 01/25/2017 0854   APPEARANCEUR Clear 07/26/2014 1920   LABSPEC 1.012 01/25/2017 0854   LABSPEC 1.012 07/26/2014 1920   PHURINE 5.0 01/25/2017 0854   GLUCOSEU NEGATIVE 01/25/2017 0854   GLUCOSEU Negative 07/26/2014 1920   HGBUR SMALL (A) 01/25/2017 0854   BILIRUBINUR NEGATIVE 01/25/2017 0854   BILIRUBINUR Negative 07/26/2014 1920   KETONESUR NEGATIVE 01/25/2017 0854   PROTEINUR 30 (A) 01/25/2017 0854   NITRITE NEGATIVE 01/25/2017 0854   LEUKOCYTESUR NEGATIVE 01/25/2017 0854   LEUKOCYTESUR Negative 07/26/2014 1920   Sepsis Labs: @LABRCNTIP (procalcitonin:4,lacticidven:4) )No results found for this or any previous visit (from the past 240 hour(s)).   Radiological Exams on Admission: Ct Renal Stone Study  Result Date: 03/07/2017 CLINICAL DATA:  Diffuse abdominal pain EXAM: CT ABDOMEN AND PELVIS WITHOUT CONTRAST TECHNIQUE: Multidetector CT imaging of the abdomen and pelvis was performed following the standard protocol without IV contrast. COMPARISON:  02/06/2017 CT, 02/27/2017 ultrasound-guided paracentesis FINDINGS: Lower chest: Moderate loculated right pleural effusion though decreased in appearance from prior exam. There may be some rounded atelectasis in the right lower lobe with compressive atelectasis as well. Heart is enlarged without pericardial effusion. The chambers of the heart are more conspicuous likely due to anemia. Coronary arteriosclerosis seen. Partially visualized central line tip in the distal SVC. Hepatobiliary: Stable cirrhotic appearance of the liver without space-occupying mass. Mixed attenuation within the gallbladder is likely due to enterohepatic recirculation of contrast and can obscure calculi, sludge and  lesions. No gallbladder distention wall thickening is seen. No biliary dilatation is identified. Pancreas: Atrophic without mass or ductal dilatation. Spleen: Normal Adrenals/Urinary Tract: Normal adrenal glands. Atrophic appearing kidneys without obstructive uropathy. Decompressed bladder. Stomach/Bowel: Fluid-filled distention of the stomach with scant amount of oral contrast seen within. There is normal small bowel rotation. There is marked displacement of nondistended bowel loops secondary to a large 19.3 x 21.2 x 3.6 cm volume of ascites occupying much of the left hemiabdomen and right lower quadrant extending into the pelvis. It is somewhat loculated in appearance. Sequela of a peritonitis is not entirely excluded given stranding of the mesentery. No definite evidence of bowel signature noted within to suggest a fluid-filled dilated loop of bowel. Normal-appearing appendix. Vascular/Lymphatic: The minimal aortic atherosclerosis without aneurysm. No pelvic or retroperitoneal adenopathy. Reproductive: Normal size prostate and seminal vesicles. Other: Large volume of ascites as described occupying much of the left hemiabdomen and pelvis. Small amount of fluid outlines the liver and right pericolic gutter. Third spacing of fluid with mild anasarca. Musculoskeletal: Lumbar spondylosis. No acute nor suspicious osseous abnormalities. IMPRESSION: 1. Moderate though smaller appearing loculated right pleural effusion with adjacent atelectasis. 2. Large volume of ascites measuring 19.3 x 21.2 x 3.6 cm occupying much the left hemiabdomen and pelvis displacing adjacent bowel loops. This has significantly increased from comparison CT. This appears somewhat loculated in appearance and sequela of a peritonitis is not entirely excluded given stranding of the mesentery with edema. No bowel obstruction or inflammation. 3. Cirrhotic appearance of the liver without space-occupying mass. 4. Soft tissue anasarca. Electronically  Signed   By: Tollie Eth M.D.   On: 03/07/2017 20:14    EKG: Sinus tachycardia at 105 bpm with leftward axis, prolonged QT and nonspecific ST-T wave changes.   Assessment/Plan  This is a 46 y.o. male with a history of   chronic systolic heart failure, ischemic cardiomyopathy with EF 10%, ESRD on HD, DM, HTN, GERD, DVT, MI, COPD, afib, recent cardiac arrest, multiple admissions for SBP  now being admitted with:  #. Spontaneous bacterial peritonitis - Admit inpatient, telemetry - IV antibiotics: Rocephin - GI, ID and IR for paracentesis - Follow up blood,urine & sputum cultures - Repeat CBC in am.   #. ESRD on HD (T-R-Sa) - Nephrology for continued HD while hospitalized  #. History of Diabetes - Accuchecks achs with RISS coverage - Heart healthy, carb controlled diet  #. History of chronic systolic CHF, ischemic cardiomyopathy - Monitor closely  #. History of CAD, recent cardiac arrest, prolonged QTc - Monitor on telemetry - Continue aspirin - Avoid meds that prolong QT  #. History of Atrial fibrillation - Continue pacerone  #. History of COPD - Hold Breo Ellipta  2/2 prolonged QT and interaction with amiodarone  #. History of depression - Continue Zoloft  #. History of Hyperthyroidism - Continue methimazole  #. History of GERD - Continue Protonix  #. History of anemia, chronic and stable - Monitor CBC  Admission status: Inpatient, telemetry IV Fluids: HL Diet/Nutrition: Heart healthy, carb controlled Consults called: GI, ID, IR  DVT Px: Heparin, SCDs and early ambulation. Code Status: Full Code  Disposition Plan: To home in 2-3 days  All the records are reviewed and case discussed with ED provider. Management plans discussed with the patient and/or family who express understanding and agree with plan of care.  Royce Sciara D.O. on 03/07/2017 at 8:22 PM Between 7am to 6pm - Pager - [phone] After 6pm go to [email or web address] - Social research officer, [employer]  [facility] Office [phone] CC: Primary care physician; Corky Downs, MD   03/07/2017, 8:22 PM

## 2017-03-08 ENCOUNTER — Inpatient Hospital Stay: Payer: BLUE CROSS/BLUE SHIELD

## 2017-03-08 ENCOUNTER — Encounter: Payer: Self-pay | Admitting: *Deleted

## 2017-03-08 DIAGNOSIS — R188 Other ascites: Secondary | ICD-10-CM

## 2017-03-08 DIAGNOSIS — R109 Unspecified abdominal pain: Secondary | ICD-10-CM

## 2017-03-08 LAB — CBC
HEMATOCRIT: 27.4 % — AB (ref 40.0–52.0)
Hemoglobin: 8.7 g/dL — ABNORMAL LOW (ref 13.0–18.0)
MCH: 27.2 pg (ref 26.0–34.0)
MCHC: 31.7 g/dL — ABNORMAL LOW (ref 32.0–36.0)
MCV: 85.8 fL (ref 80.0–100.0)
Platelets: 198 10*3/uL (ref 150–440)
RBC: 3.2 MIL/uL — ABNORMAL LOW (ref 4.40–5.90)
RDW: 24.7 % — AB (ref 11.5–14.5)
WBC: 5.7 10*3/uL (ref 3.8–10.6)

## 2017-03-08 LAB — BASIC METABOLIC PANEL
ANION GAP: 12 (ref 5–15)
BUN: 46 mg/dL — AB (ref 6–20)
CO2: 23 mmol/L (ref 22–32)
Calcium: 8.9 mg/dL (ref 8.9–10.3)
Chloride: 98 mmol/L — ABNORMAL LOW (ref 101–111)
Creatinine, Ser: 4.75 mg/dL — ABNORMAL HIGH (ref 0.61–1.24)
GFR calc Af Amer: 15 mL/min — ABNORMAL LOW (ref >60)
GFR, EST NON AFRICAN AMERICAN: 13 mL/min — AB (ref >60)
Glucose, Bld: 147 mg/dL — ABNORMAL HIGH (ref 65–99)
POTASSIUM: 4.8 mmol/L (ref 3.5–5.1)
SODIUM: 133 mmol/L — AB (ref 135–145)

## 2017-03-08 LAB — BODY FLUID CELL COUNT WITH DIFFERENTIAL
EOS FL: 3 %
Lymphs, Fluid: 5 %
MONOCYTE-MACROPHAGE-SEROUS FLUID: 19 %
Neutrophil Count, Fluid: 73 %
OTHER CELLS FL: 0 %
Total Nucleated Cell Count, Fluid: 17 cu mm

## 2017-03-08 LAB — GLUCOSE, CAPILLARY
GLUCOSE-CAPILLARY: 125 mg/dL — AB (ref 65–99)
Glucose-Capillary: 110 mg/dL — ABNORMAL HIGH (ref 65–99)
Glucose-Capillary: 104 mg/dL — ABNORMAL HIGH (ref 65–99)
Glucose-Capillary: 116 mg/dL — ABNORMAL HIGH (ref 65–99)

## 2017-03-08 LAB — ALBUMIN, PLEURAL OR PERITONEAL FLUID: Albumin, Fluid: 1.1 g/dL

## 2017-03-08 LAB — LACTATE DEHYDROGENASE, PLEURAL OR PERITONEAL FLUID: LD FL: 92 U/L — AB (ref 3–23)

## 2017-03-08 LAB — PROTEIN, PLEURAL OR PERITONEAL FLUID: Total protein, fluid: 3.1 g/dL

## 2017-03-08 LAB — PHOSPHORUS: PHOSPHORUS: 6.2 mg/dL — AB (ref 2.5–4.6)

## 2017-03-08 LAB — MRSA PCR SCREENING: MRSA BY PCR: POSITIVE — AB

## 2017-03-08 MED ORDER — HYDROMORPHONE HCL 1 MG/ML IJ SOLN
0.5000 mg | Freq: Once | INTRAMUSCULAR | Status: AC
  Administered 2017-03-08: 0.5 mg via INTRAVENOUS
  Filled 2017-03-08: qty 0.5

## 2017-03-08 MED ORDER — ALTEPLASE 2 MG IJ SOLR
2.0000 mg | Freq: Once | INTRAMUSCULAR | Status: AC
  Administered 2017-03-08: 2 mg
  Filled 2017-03-08: qty 2

## 2017-03-08 MED ORDER — DEXTROSE 5 % IV SOLN
2.0000 g | INTRAVENOUS | Status: DC
  Administered 2017-03-08: 2 g via INTRAVENOUS
  Filled 2017-03-08: qty 2

## 2017-03-08 MED ORDER — DIPHENHYDRAMINE HCL 25 MG PO CAPS
25.0000 mg | ORAL_CAPSULE | Freq: Three times a day (TID) | ORAL | Status: DC | PRN
  Administered 2017-03-08 – 2017-03-09 (×3): 25 mg via ORAL
  Filled 2017-03-08 (×3): qty 1

## 2017-03-08 MED ORDER — MUPIROCIN 2 % EX OINT
1.0000 "application " | TOPICAL_OINTMENT | Freq: Two times a day (BID) | CUTANEOUS | Status: DC
  Filled 2017-03-08: qty 22

## 2017-03-08 MED ORDER — MUPIROCIN CALCIUM 2 % EX CREA
TOPICAL_CREAM | Freq: Two times a day (BID) | CUTANEOUS | Status: DC
  Filled 2017-03-08: qty 15

## 2017-03-08 MED ORDER — NEPRO/CARBSTEADY PO LIQD
237.0000 mL | Freq: Two times a day (BID) | ORAL | Status: DC
  Administered 2017-03-09 (×2): 237 mL via ORAL

## 2017-03-08 MED ORDER — CHLORHEXIDINE GLUCONATE CLOTH 2 % EX PADS
6.0000 | MEDICATED_PAD | Freq: Every day | CUTANEOUS | Status: DC
  Administered 2017-03-09: 6 via TOPICAL

## 2017-03-08 MED ORDER — MUPIROCIN 2 % EX OINT
1.0000 "application " | TOPICAL_OINTMENT | Freq: Two times a day (BID) | CUTANEOUS | Status: DC
  Administered 2017-03-08: 1 via NASAL
  Filled 2017-03-08: qty 22

## 2017-03-08 MED ORDER — PREMIER PROTEIN SHAKE
11.0000 [oz_av] | ORAL | Status: DC
  Administered 2017-03-08: 11 [oz_av] via ORAL

## 2017-03-08 NOTE — Consult Note (Signed)
Jonathon Bellows MD, MRCP(U.K) [address]  [place]  [place], [place] [address]  Main: [phone]  Fax: [phone]  Consultation  Referring Provider:   Dr Ara Kussmaul Primary Care Physician:  Cletis Athens, MD Primary Gastroenterologist:       None    Reason for Consultation:     SBP  Date of Admission:  03/07/2017 Date of Consultation:  03/08/2017         HPI:   Juan Roberson. is a 46 y.o. male who was seen during his last admission by myself. I wrote a consult note on 02/06/17.   Summary of prior history : He has a history of CHF , recurrent ascites, ESRD on dialysis , EF of 20%. He was discharged on 01/18/2017 and was diagnosed with SBP. The neutrophil count met criteria for SBP, the cultures were negative(culture negative neutrocytic ascites). He was treated with antibiotics. Discharged on Ciprofloxacin . Readmitted on 02/06/17 during last admission with ascites - paracentesis showed no SBP . At his last admission his ascitic fluid WCC was > 500 but ANC did not meet criteria for SBP and my impression at that time was that it was  possible that he had incompletely treated SBP . I ordered a CT scan of the abdomen to r/o causes for secondary peritonitis and found small to moderate volume ascites. My plan at that time was for life long SBP prophylaxsis as well as repeat ascitic fluid analysis . He presented to the ER yet again on 02/27/17 with ascites and was drained and discharged- no fluid was send for repeat analysis.   This admission  He presented to the ER on 03/07/17 with abdominal pain of 1 day duration , attempt made in the ER to tap the ascites but failed as he felt there were loculations and started the patient on antibiotics. Plan for IR guided aspiration . CT scan renal stone protocol showed loculated right pleural effusion and large volume ascites which is loculated in appearance, cirrhotic appearing liver also seen .   Says he just had his paracentesis a short while back and  since he has no abdominal pain, denies any fevers.    Past Medical History:  Diagnosis Date  . Cellulitis and abscess of foot    Left-Dr. Vickki Muff  . CHF (congestive heart failure) (HCC)    EF 20%  . CKD (chronic kidney disease), stage III   . Diabetes mellitus without complication ([place])    a. Dx ~ 1996.  [name] Dysrhythmia   . Essential hypertension   . Gastritis    a. 04/2015 hematemesis -> EGD: gastritis, esophagitis, duodenitis.  No active bleeding.  PPI added.  [name] GERD (gastroesophageal reflux disease)   . Left leg DVT ([place])    a. Dx 05/2015 -> Coumadin.  . MI (myocardial infarction) ([place])   . Osteomyelitis ([place])    a. 05/2015 L foot.    Past Surgical History:  Procedure Laterality Date  . CARDIAC CATHETERIZATION Right 10/26/2016   Procedure: Left Heart Cath and Coronary Angiography;  Surgeon: Dionisio David, MD;  Location: [facility];  Service: Cardiovascular;  Laterality: Right;  . DIALYSIS/PERMA CATHETER INSERTION N/A 01/17/2017   Procedure: Dialysis/Perma Catheter Insertion;  Surgeon: Algernon Huxley, MD;  Location: [facility];  Service: Cardiovascular;  Laterality: N/A;  . DIALYSIS/PERMA CATHETER REMOVAL N/A 12/05/2016   Procedure: Dialysis/Perma Catheter Removal;  Surgeon: Katha Cabal, MD;  Location: [facility];  Service: Cardiovascular;  Laterality: N/A;  . ESOPHAGOGASTRODUODENOSCOPY N/A 04/07/2015   Procedure: ESOPHAGOGASTRODUODENOSCOPY (EGD);  Surgeon: Midge Minium, MD;  Location: [facility];  Service: Endoscopy;  Laterality: N/A;  . ESOPHAGOGASTRODUODENOSCOPY (EGD) WITH PROPOFOL Left 06/30/2015   Procedure: ESOPHAGOGASTRODUODENOSCOPY (EGD) WITH PROPOFOL;  Surgeon: Christena Deem, MD;  Location: [facility];  Service: Endoscopy;  Laterality: Left;  . FOOT SURGERY    . I&D EXTREMITY Left 04/06/2015   Procedure: IRRIGATION AND DEBRIDEMENT EXTREMITY;  Surgeon: Gwyneth Revels, DPM;  Location: [facility];  Service: Podiatry;  Laterality: Left;  .  IRRIGATION AND DEBRIDEMENT FOOT Left 05/21/2015   Procedure: IRRIGATION AND DEBRIDEMENT FOOT;  Surgeon: Linus Galas, MD;  Location: [facility];  Service: Podiatry;  Laterality: Left;  . IRRIGATION AND DEBRIDEMENT KNEE Right 10/18/2016   Procedure: IRRIGATION AND DEBRIDEMENT KNEE;  Surgeon: Deeann Saint, MD;  Location: [facility];  Service: Orthopedics;  Laterality: Right;  . KNEE ARTHROSCOPY  10/18/2016   Procedure: ARTHROSCOPY KNEE;  Surgeon: Deeann Saint, MD;  Location: [facility];  Service: Orthopedics;;  . PERIPHERAL VASCULAR CATHETERIZATION N/A 10/27/2016   Procedure: Dialysis/Perma Catheter Insertion;  Surgeon: Annice Needy, MD;  Location: [facility];  Service: Cardiovascular;  Laterality: N/A;    Prior to Admission medications   Medication Sig Start Date End Date Taking? Authorizing Provider  amiodarone (PACERONE) 200 MG tablet Take 1 tablet (200 mg total) by mouth daily. 09/25/16  Yes Shaune Pollack, MD  aspirin EC 81 MG EC tablet Take 1 tablet (81 mg total) by mouth daily. 07/23/16  Yes Wieting, Richard, MD  bisacodyl (DULCOLAX) 10 MG suppository Place 1 suppository (10 mg total) rectally daily as needed for moderate constipation. 10/27/16  Yes Altamese Dilling, MD  fluticasone furoate-vilanterol (BREO ELLIPTA) 100-25 MCG/INH AEPB Inhale 1 puff into the lungs daily.   Yes [provider]  insulin aspart (NOVOLOG) 100 UNIT/ML injection Inject 4 Units into the skin 3 (three) times daily with meals. 10/31/16  Yes Enid Baas, MD  methimazole (TAPAZOLE) 10 MG tablet Take 1 tablet (10 mg total) by mouth daily. 07/22/16  Yes Alford Highland, MD  midodrine (PROAMATINE) 5 MG tablet On Tu, Thus, Sat 30 min before Hemodialysis Patient taking differently: Take 5 mg by mouth Every Tuesday,Thursday,and Saturday with dialysis. 30 minutes before hemodialysis 02/09/17  Yes Sainani, Rolly Pancake, MD  multivitamin (RENA-VIT) TABS tablet Take 1 tablet by mouth at bedtime. 01/19/17  Yes Enedina Finner, MD  pantoprazole (PROTONIX) 40 MG tablet Take 1 tablet (40 mg total) by mouth daily. 09/25/16  Yes Shaune Pollack, MD  senna-docusate (SENOKOT-S) 8.6-50 MG tablet Take 1 tablet by mouth at bedtime.   Yes [provider]  sertraline (ZOLOFT) 50 MG tablet Take 50 mg by mouth daily.   Yes [provider]  zolpidem (AMBIEN) 5 MG tablet Take 5 mg by mouth at bedtime as needed for sleep.  11/14/16  Yes [provider]    Family History  Problem Relation Age of Onset  . Coronary artery disease Father   . Pancreatic cancer Mother   . Breast cancer Sister   . Lung cancer Brother   . Cervical cancer Sister      Social History  Substance Use Topics  . Smoking status: Never Smoker  . Smokeless tobacco: Never Used  . Alcohol use No    Allergies as of 03/07/2017 - Review Complete 03/07/2017  Allergen Reaction Noted  . Sulfur Other (See Comments) 04/02/2015    Review of Systems:    All  systems reviewed and negative except where noted in HPI.   Physical Exam:  Vital signs in last 24 hours: Temp:  [97.8 F (36.6 C)-98.2 F (36.8 C)] 97.8 F (36.6 C) (06/07 0409) Pulse Rate:  [101-107] 102 (06/07 0409) Resp:  [14-25] 20 (06/07 0409) BP: (98-120)/(73-93) 98/73 (06/07 0409) SpO2:  [96 %-100 %] 98 % (06/07 0409) Weight:  [173 lb 4.8 oz (78.6 kg)-196 lb (88.9 kg)] 173 lb 4.8 oz (78.6 kg) (06/06 2252) Last BM Date: 03/03/17 (per patient) General:   Pleasant, cooperative in NAD Head:  Normocephalic and atraumatic. Eyes:   No icterus.   Conjunctiva pink. PERRLA. Ears:  Normal auditory acuity. Neck:  Supple; no masses or thyroidomegaly Lungs: Respirations even and unlabored. Lungs clear to auscultation bilaterally.   No wheezes, crackles, or rhonchi.  Heart:  Regular rate and rhythm;  Without murmur, clicks, rubs or gallops Abdomen:  Soft, nondistended, nontender. Normal bowel sounds. No appreciable masses or hepatomegaly.  No rebound or guarding.  Rectal:  Not  performed. Extremities:  Without edema, cyanosis or clubbing. Neurologic:  Alert and oriented x3;  grossly normal neurologically. Skin:  Intact without significant lesions or rashes. Cervical Nodes:  No significant cervical adenopathy. Psych:  Alert and cooperative. Normal affect.  LAB RESULTS:  Recent Labs  03/07/17 1820 03/08/17 0438  WBC 6.8 5.7  HGB 8.7* 8.7*  HCT 28.2* 27.4*  PLT 249 198   BMET  Recent Labs  03/07/17 1820 03/08/17 0438  NA 133* 133*  K 4.9 4.8  CL 97* 98*  CO2 23 23  GLUCOSE 175* 147*  BUN 41* 46*  CREATININE 4.58* 4.75*  CALCIUM 9.1 8.9   LFT  Recent Labs  03/07/17 1820  PROT 7.2  ALBUMIN 2.9*  AST 24  ALT 16*  ALKPHOS 57  BILITOT 1.8*   PT/INR No results for input(s): LABPROT, INR in the last 72 hours.  STUDIES: Ct Renal Stone Study  Result Date: 03/07/2017 CLINICAL DATA:  Diffuse abdominal pain EXAM: CT ABDOMEN AND PELVIS WITHOUT CONTRAST TECHNIQUE: Multidetector CT imaging of the abdomen and pelvis was performed following the standard protocol without IV contrast. COMPARISON:  02/06/2017 CT, 02/27/2017 ultrasound-guided paracentesis FINDINGS: Lower chest: Moderate loculated right pleural effusion though decreased in appearance from prior exam. There may be some rounded atelectasis in the right lower lobe with compressive atelectasis as well. Heart is enlarged without pericardial effusion. The chambers of the heart are more conspicuous likely due to anemia. Coronary arteriosclerosis seen. Partially visualized central line tip in the distal SVC. Hepatobiliary: Stable cirrhotic appearance of the liver without space-occupying mass. Mixed attenuation within the gallbladder is likely due to enterohepatic recirculation of contrast and can obscure calculi, sludge and lesions. No gallbladder distention wall thickening is seen. No biliary dilatation is identified. Pancreas: Atrophic without mass or ductal dilatation. Spleen: Normal Adrenals/Urinary  Tract: Normal adrenal glands. Atrophic appearing kidneys without obstructive uropathy. Decompressed bladder. Stomach/Bowel: Fluid-filled distention of the stomach with scant amount of oral contrast seen within. There is normal small bowel rotation. There is marked displacement of nondistended bowel loops secondary to a large 19.3 x 21.2 x 3.6 cm volume of ascites occupying much of the left hemiabdomen and right lower quadrant extending into the pelvis. It is somewhat loculated in appearance. Sequela of a peritonitis is not entirely excluded given stranding of the mesentery. No definite evidence of bowel signature noted within to suggest a fluid-filled dilated loop of bowel. Normal-appearing appendix. Vascular/Lymphatic: The minimal aortic atherosclerosis without aneurysm. No  pelvic or retroperitoneal adenopathy. Reproductive: Normal size prostate and seminal vesicles. Other: Large volume of ascites as described occupying much of the left hemiabdomen and pelvis. Small amount of fluid outlines the liver and right pericolic gutter. Third spacing of fluid with mild anasarca. Musculoskeletal: Lumbar spondylosis. No acute nor suspicious osseous abnormalities. IMPRESSION: 1. Moderate though smaller appearing loculated right pleural effusion with adjacent atelectasis. 2. Large volume of ascites measuring 19.3 x 21.2 x 3.6 cm occupying much the left hemiabdomen and pelvis displacing adjacent bowel loops. This has significantly increased from comparison CT. This appears somewhat loculated in appearance and sequela of a peritonitis is not entirely excluded given stranding of the mesentery with edema. No bowel obstruction or inflammation. 3. Cirrhotic appearance of the liver without space-occupying mass. 4. Soft tissue anasarca. Electronically Signed   By: Ashley Royalty M.D.   On: 03/07/2017 20:14      Impression / Plan:   Juan Roberson. is a 46 y.o. y/o male with recurrent ascites, CHF EF 10-20% who had had an episode  of SBP in 12/2016, admission in 01/2017 showed ascitic fluid WCC on >500 but ANC did not meet criteria, ascitic fluid was not loculated at that time. Did not have a repeat ascitic tap to be tested for response to treatment and comes in with 1 day history of abdominal pain, CT scan shows loculated pleural and abdominal fluid collections.   Plan  1.F/u  IR guided diagnostic and therapeutic paracentesis with fluid send for cell count, cultures, total protein , albumin, LDH, glucose.  2. Suggest ID consult to help with antibiotics- I have discussed with Dr Ola Spurr 3. He will definitely need long term antibiotic prophylaxsis   4. Overall not a good prognosis with renal failure, heart failure and peritonitis.   Thank you for involving me in the care of this patient.      LOS: 1 day   Jonathon Bellows, MD  03/08/2017, 9:29 AM

## 2017-03-08 NOTE — Progress Notes (Signed)
Pre dialysis assessment 

## 2017-03-08 NOTE — Consult Note (Signed)
[facility] Infectious Disease     Reason for Consult: MRSA septic joint and bacteremia    Referring Physician: Ferdinand Cava Date of Admission:  03/07/2017   Active Problems:   SBP (spontaneous bacterial peritonitis) (HCC)   HPI: Juan Roberson. is a 46 y.o. male admitted with abdominal distention. 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 He has had several recent admissions 3/9 and 4/20 and 4/28. Recent paracentesis March 7- wbc 1236, 81 % PMNs culture neg - dc on cipro April 16 - 802 Wbc pmn 51%, culture neg - dc on cipro April 26 -wbc 138, 3% pmn culture neg - dc on cipro  May 8 503 wbc, 39% Pmns culture neg  He was dced on 5/11 on IV ceftazidime at HD for 10 days. He is unsure when he stopped it but reports he was at [facility] for the last week after a cardiac arrest during anesthesia.  He says had thoracentesis while there. Removed 2 L fluid per report and was consistent with transudate. Pleural fluid at [facility] had neg cytology wbc 123, 23% PMN.    Now admitted with Abd pain, abd distention.  Has constipation, no bm in 5 days. Abd pain relieved with para done today.    Past Medical History:  Diagnosis Date  . Cellulitis and abscess of foot    Left-Dr. Ether Griffins  . CHF (congestive heart failure) (HCC)    EF 20%  . CKD (chronic kidney disease), stage III   . Diabetes mellitus without complication (HCC)    a. Dx ~ 1996.  [name] Dysrhythmia   . Essential hypertension   . Gastritis    a. 04/2015 hematemesis -> EGD: gastritis, esophagitis, duodenitis.  No active bleeding.  PPI added.  [name] GERD (gastroesophageal reflux disease)   . Left leg DVT (HCC)    a. Dx 05/2015 -> Coumadin.  . MI (myocardial infarction) (HCC)   . Osteomyelitis (HCC)    a. 05/2015 L foot.   Past Surgical History:  Procedure Laterality Date  . CARDIAC CATHETERIZATION Right 10/26/2016    Procedure: Left Heart Cath and Coronary Angiography;  Surgeon: Laurier Nancy, MD;  Location: [facility] INVASIVE CV LAB;  Service: Cardiovascular;  Laterality: Right;  . DIALYSIS/PERMA CATHETER INSERTION N/A 01/17/2017   Procedure: Dialysis/Perma Catheter Insertion;  Surgeon: Annice Needy, MD;  Location: [facility] INVASIVE CV LAB;  Service: Cardiovascular;  Laterality: N/A;  . DIALYSIS/PERMA CATHETER REMOVAL N/A 12/05/2016   Procedure: Dialysis/Perma Catheter Removal;  Surgeon: Renford Dills, MD;  Location: [facility] INVASIVE CV LAB;  Service: Cardiovascular;  Laterality: N/A;  . ESOPHAGOGASTRODUODENOSCOPY N/A 04/07/2015   Procedure: ESOPHAGOGASTRODUODENOSCOPY (EGD);  Surgeon: Midge Minium, MD;  Location[facility] ENDOSCOPY;  Service: Endoscopy;  Laterality: N/A;  . ESOPHAGOGASTRODUODENOSCOPY (EGD) WITH PROPOFOL Left 06/30/2015   Procedure: ESOPHAGOGASTRODUODENOSCOPY (EGD) WITH PROPOFOL;  Surgeon: Christena Deem, MD;  Location: [facility] ENDOSCOPY;  Service: Endoscopy;  Laterality: Left;  . FOOT SURGERY    . I&D EXTREMITY Left 04/06/2015   Procedure: IRRIGATION AND DEBRIDEMENT EXTREMITY;  Surgeon: Gwyneth Revels, DPM;  Location: [facility] ORS;  Service: Podiatry;  Laterality: Left;  . IRRIGATION AND DEBRIDEMENT FOOT Left 05/21/2015   Procedure: IRRIGATION AND DEBRIDEMENT FOOT;  Surgeon: Linus Galas, MD;  Location: [facility] ORS;  Service: Podiatry;  Laterality: Left;  . IRRIGATION AND DEBRIDEMENT KNEE Right 10/18/2016   Procedure: IRRIGATION  AND DEBRIDEMENT KNEE;  Surgeon: Deeann Saint, MD;  Location: [facility] ORS;  Service: Orthopedics;  Laterality: Right;  . KNEE ARTHROSCOPY  10/18/2016   Procedure: ARTHROSCOPY KNEE;  Surgeon: Deeann Saint, MD;  Location: [facility] ORS;  Service: Orthopedics;;  . PERIPHERAL VASCULAR CATHETERIZATION N/A 10/27/2016   Procedure: Dialysis/Perma Catheter Insertion;  Surgeon: Annice Needy, MD;  Location: [facility] INVASIVE CV LAB;  Service: Cardiovascular;  Laterality: N/A;   Social History  Substance Use Topics  .  Smoking status: Never Smoker  . Smokeless tobacco: Never Used  . Alcohol use No   Family History  Problem Relation Age of Onset  . Coronary artery disease Father   . Pancreatic cancer Mother   . Breast cancer Sister   . Lung cancer Brother   . Cervical cancer Sister     Allergies:  Allergies  Allergen Reactions  . Sulfur Other (See Comments)    Pt states that this medication causes renal failure.      Current antibiotics: Antibiotics Given (last 72 hours)    Date/Time Action Medication Dose Rate   03/07/17 1957 New Bag/Given   cefTRIAXone (ROCEPHIN) 2 g in dextrose 5 % 50 mL IVPB 2 g 100 mL/hr   03/07/17 2212 New Bag/Given   cefTRIAXone (ROCEPHIN) 2 g in dextrose 5 % 50 mL IVPB 2 g 100 mL/hr      MEDICATIONS: . amiodarone  200 mg Oral Daily  . aspirin EC  81 mg Oral Daily  . heparin  5,000 Units Subcutaneous Q8H  . insulin aspart  0-15 Units Subcutaneous TID WC  . insulin aspart  0-5 Units Subcutaneous QHS  . methimazole  10 mg Oral Daily  . midodrine  5 mg Oral Q T,Th,Sa-HD  . multivitamin  1 tablet Oral QHS  . pantoprazole  40 mg Oral Daily  . sertraline  50 mg Oral Daily  . sodium chloride flush  3 mL Intravenous Q12H    Review of Systems - 11 systems reviewed and negative per HPI   OBJECTIVE: Temp:  [97.8 F (36.6 C)-98.2 F (36.8 C)] 98 F (36.7 C) (06/07 1149) Pulse Rate:  [101-107] 104 (06/07 1149) Resp:  [14-25] 18 (06/07 1149) BP: (98-120)/(61-93) 104/77 (06/07 1149) SpO2:  [92 %-100 %] 98 % (06/07 1149) Weight:  [78.6 kg (173 lb 4.8 oz)-88.9 kg (196 lb)] 78.6 kg (173 lb 4.8 oz) (06/06 2252) Physical Exam  Constitutional: He is oriented to person, place, and time. Disheveled, chronically ill appearing, very thin HENT: anicteric Mouth/Throat: Oropharynx is clear and dry. No oropharyngeal exudate.  Cardiovascular: Normal rate, regular rhythm -distant  Pulmonary/Chest:decreased bs bil Abdominal: Soft. Mild distention, mild diffuse ttp   Lymphadenopathy: He has no cervical adenopathy.  Ext 1+ edema HD cath R chest wall wnl Neurological: He is alert and oriented to person, place, and time.  Skin: Skin is warm and dry. No rash noted. No erythema.  Psychiatric: He has a normal mood and affect. His behavior is normal.   LABS: Results for orders placed or performed during the hospital encounter of 03/07/17 (from the past 48 hour(s))  CBC with Differential/Platelet     Status: Abnormal   Collection Time: 03/07/17  6:20 PM  Result Value Ref Range   WBC 6.8 3.8 - 10.6 K/uL   RBC 3.33 (L) 4.40 - 5.90 MIL/uL   Hemoglobin 8.7 (L) 13.0 - 18.0 g/dL   HCT 55.7 (L) 33.7 - 80.1 %   MCV 84.9 80.0 - 100.0 fL   MCH 26.2  26.0 - 34.0 pg   MCHC 30.8 (L) 32.0 - 36.0 g/dL   RDW 24.3 (H) 11.5 - 14.5 %   Platelets 249 150 - 440 K/uL   Neutrophils Relative % 71 %   Neutro Abs 4.9 1.4 - 6.5 K/uL   Lymphocytes Relative 15 %   Lymphs Abs 1.0 1.0 - 3.6 K/uL   Monocytes Relative 12 %   Monocytes Absolute 0.8 0.2 - 1.0 K/uL   Eosinophils Relative 1 %   Eosinophils Absolute 0.0 0 - 0.7 K/uL   Basophils Relative 1 %   Basophils Absolute 0.0 0 - 0.1 K/uL  Comprehensive metabolic panel     Status: Abnormal   Collection Time: 03/07/17  6:20 PM  Result Value Ref Range   Sodium 133 (L) 135 - 145 mmol/L   Potassium 4.9 3.5 - 5.1 mmol/L   Chloride 97 (L) 101 - 111 mmol/L   CO2 23 22 - 32 mmol/L   Glucose, Bld 175 (H) 65 - 99 mg/dL   BUN 41 (H) 6 - 20 mg/dL   Creatinine, Ser 4.58 (H) 0.61 - 1.24 mg/dL   Calcium 9.1 8.9 - 10.3 mg/dL   Total Protein 7.2 6.5 - 8.1 g/dL   Albumin 2.9 (L) 3.5 - 5.0 g/dL   AST 24 15 - 41 U/L   ALT 16 (L) 17 - 63 U/L   Alkaline Phosphatase 57 38 - 126 U/L   Total Bilirubin 1.8 (H) 0.3 - 1.2 mg/dL   GFR calc non Af Amer 14 (L) >60 mL/min   GFR calc Af Amer 16 (L) >60 mL/min    Comment: (NOTE) The eGFR has been calculated using the CKD EPI equation. This calculation has not been validated in all clinical  situations. eGFR's persistently <60 mL/min signify possible Chronic Kidney Disease.    Anion gap 13 5 - 15  Lipase, blood     Status: None   Collection Time: 03/07/17  6:20 PM  Result Value Ref Range   Lipase 17 11 - 51 U/L  Troponin I     Status: Abnormal   Collection Time: 03/07/17  6:20 PM  Result Value Ref Range   Troponin I 0.03 (HH) <0.03 ng/mL    Comment: CRITICAL RESULT CALLED TO, READ BACK BY AND VERIFIED WITH RACQUEL DAVID AT 1912 ON 03/07/2017 JJB   Ammonia     Status: None   Collection Time: 03/07/17  6:20 PM  Result Value Ref Range   Ammonia 21 9 - 35 umol/L  Glucose, capillary     Status: Abnormal   Collection Time: 03/07/17 11:40 PM  Result Value Ref Range   Glucose-Capillary 147 (H) 65 - 99 mg/dL  Basic metabolic panel     Status: Abnormal   Collection Time: 03/08/17  4:38 AM  Result Value Ref Range   Sodium 133 (L) 135 - 145 mmol/L   Potassium 4.8 3.5 - 5.1 mmol/L   Chloride 98 (L) 101 - 111 mmol/L   CO2 23 22 - 32 mmol/L   Glucose, Bld 147 (H) 65 - 99 mg/dL   BUN 46 (H) 6 - 20 mg/dL   Creatinine, Ser 4.75 (H) 0.61 - 1.24 mg/dL   Calcium 8.9 8.9 - 10.3 mg/dL   GFR calc non Af Amer 13 (L) >60 mL/min   GFR calc Af Amer 15 (L) >60 mL/min    Comment: (NOTE) The eGFR has been calculated using the CKD EPI equation. This calculation has not been validated in all clinical situations.  eGFR's persistently <60 mL/min signify possible Chronic Kidney Disease.    Anion gap 12 5 - 15  CBC     Status: Abnormal   Collection Time: 03/08/17  4:38 AM  Result Value Ref Range   WBC 5.7 3.8 - 10.6 K/uL   RBC 3.20 (L) 4.40 - 5.90 MIL/uL   Hemoglobin 8.7 (L) 13.0 - 18.0 g/dL   HCT 27.4 (L) 40.0 - 52.0 %   MCV 85.8 80.0 - 100.0 fL   MCH 27.2 26.0 - 34.0 pg   MCHC 31.7 (L) 32.0 - 36.0 g/dL   RDW 24.7 (H) 11.5 - 14.5 %   Platelets 198 150 - 440 K/uL  Glucose, capillary     Status: Abnormal   Collection Time: 03/08/17  7:36 AM  Result Value Ref Range   Glucose-Capillary  125 (H) 65 - 99 mg/dL  Glucose, capillary     Status: Abnormal   Collection Time: 03/08/17 12:11 PM  Result Value Ref Range   Glucose-Capillary 110 (H) 65 - 99 mg/dL   No components found for: ESR, C REACTIVE PROTEIN MICRO: Recent Results (from the past 720 hour(s))  Culture, body fluid-bottle     Status: None   Collection Time: 02/08/17  3:53 PM  Result Value Ref Range Status   Specimen Description FLUID PLEURAL  Final   Special Requests NONE  Final   Culture   Final    NO GROWTH 5 DAYS Performed at [facility], [address], [place], [place] [address]    Report Status 02/13/2017 FINAL  Final  Gram stain     Status: None   Collection Time: 02/08/17  3:53 PM  Result Value Ref Range Status   Specimen Description FLUID PLEURAL  Final   Special Requests NONE  Final   Gram Stain   Final    ABUNDANT WBC PRESENT,BOTH PMN AND MONONUCLEAR NO ORGANISMS SEEN Performed at [facility], [place] [address], [place], [place] [address]    Report Status 02/08/2017 FINAL  Final    IMAGING: Ct Abdomen Pelvis Wo Contrast  Result Date: 02/06/2017 CLINICAL DATA:  Acute onset of generalized abdominal distention and shortness of breath. Status post recent paracentesis. Concern for spontaneous bacterial peritonitis. Initial encounter. EXAM: CT ABDOMEN AND PELVIS WITHOUT CONTRAST TECHNIQUE: Multidetector CT imaging of the abdomen and pelvis was performed following the standard protocol without IV contrast. COMPARISON:  CT of the abdomen and pelvis performed 01/09/2017 FINDINGS: Lower chest: There is a large right-sided pleural effusion, occupying nearly the entirety of the visualized right hemithorax. There is underlying partial consolidation of the right lung. The visualized portions of the left lung are grossly clear. Diffuse coronary artery calcifications are seen. The heart is mildly enlarged. A central line is noted ending about the proximal right atrium. Hepatobiliary: The mildly nodular  contour of the liver raises concern for mild hepatic cirrhosis. The gallbladder is grossly unremarkable in appearance. The common bile duct remains normal in caliber. Pancreas: The pancreas is within normal limits. Spleen: The spleen is unremarkable in appearance. Adrenals/Urinary Tract: The adrenal glands are unremarkable in appearance. The kidneys are within normal limits. There is no evidence of hydronephrosis. No renal or ureteral stones are identified. No perinephric stranding is seen. Stomach/Bowel: The stomach is unremarkable in appearance. The small bowel is within normal limits. The appendix is normal in caliber, without evidence of appendicitis. The colon is unremarkable in appearance. Vascular/Lymphatic: Scattered calcification is seen along the abdominal aorta and its branches.  The abdominal aorta is otherwise grossly unremarkable. The inferior vena cava is grossly unremarkable. No retroperitoneal lymphadenopathy is seen. No pelvic sidewall lymphadenopathy is identified. Reproductive: The bladder is mildly distended and grossly unremarkable. The prostate remains normal in size. Other: Small to moderate volume ascites is seen within the abdomen and pelvis, with surrounding soft tissue inflammation. Mild diffuse mesenteric inflammation is noted at the upper abdomen, of uncertain significance. Musculoskeletal: No acute osseous abnormalities are identified. Multilevel vacuum phenomenon is noted along the lumbar spine, with underlying facet disease. The visualized musculature is unremarkable in appearance. IMPRESSION: 1. Small to moderate volume ascites within the abdomen and pelvis, with surrounding soft tissue inflammation. Mild diffuse mesenteric inflammation at the upper abdomen. Would correlate for clinical evidence of peritonitis. 2. Large right-sided pleural effusion, occupying nearly the entirety of the visualized right hemithorax, with partial consolidation of the right lung. 3. Scattered aortic  atherosclerosis. 4. Mild cardiomegaly.  Diffuse coronary artery calcifications seen. 5. Mildly nodular contour of the liver raises concern for mild hepatic cirrhosis. 6. Mild degenerative change along the lumbar spine. Electronically Signed   By: Garald Balding M.D.   On: 02/06/2017 20:36   Dg Chest 2 View  Result Date: 02/07/2017 CLINICAL DATA:  Shortness of breath. EXAM: CHEST  2 VIEW COMPARISON:  Chest radiograph yesterday at 0925 hours FINDINGS: Left internal jugular dialysis catheter tip remains in the distal SVC. Large right pleural effusion with only minimal aerated right perihilar lung, questionable progression from prior radiograph. Left lung is clear. Mediastinal contours are obscured secondary to right hemithorax calcification. No pneumothorax. IMPRESSION: Large right pleural effusion with only minimal aerated right perihilar lung. Possible increase in pleural fluid from chest radiograph yesterday. Electronically Signed   By: Jeb Levering M.D.   On: 02/07/2017 01:23   [place] Ue Vein Mapping Left  Result Date: 02/09/2017 CLINICAL DATA:  46 year old male with end-stage renal disease on hemodialysis. Preoperative vascular mapping prior to arteriovenous fistula formation. EXAM: [place] EXTREM UP VEIN MAPPING COMPARISON:  None. FINDINGS: LEFT ARTERIES Wrist Radial Artery: Size 1.5 mmmm  Waveform triphasic Wrist Ulnar Artery: Size 1.2 mmmm  Waveform triphasic Prox. Forearm Radial Artery: Size 1.9 mmmm  Waveform triphasic Upper Arm Brachial Artery: Size 3.9 mmmm  Waveform triphasic LEFT VEINS Forearm Cephalic Vein: Prox 4.0XB Distal less than 44m Depth 23.5HGForearm Basilic Vein: Prox 19.9MEDistal less than 2348mDepth 0.2.6STpper Arm Cephalic Vein: Prox 1.4.1DQistal less than 48m69mepth 5.12.2WLper Arm Basilic Vein: Prox 3.07.9GXstal 2.0mm43mpth 7.5mm 38mer Arm Brachial Vein: Prox 4.9mm D29mal 5.3mm De40m 7.8mm ADD86mONAL LEFT VEINS Axillary Vein:  9.0mm Subc33mian Vein: Patient: Yes Respiratory Phasicity:  Present Internal Jugular Vein: Patent: Yes    Respiratory Phasicity: Present Branches > 2 mm: None IMPRESSION: 1. Widely patent arterial structures without evidence of hemodynamically significant stenosis or atherosclerotic plaque. 2. Relatively small caliber radial artery. The brachial artery measures 3.9 cm in the upper arm. 3. Hypoplastic cephalic vein, likely too small for utilization. 4. The basilic vein is hypoplastic in the forearm but between 2 and 3 mm in diameter in the upper arm and may be suitable for use. 5. No evidence of central venous stenosis. 6. Pulsatile flow noted throughout the venous structures. Differential considerations include elevated right heart pressures (tricuspid regurgitation, chronic right heart failure, COPD and pulmonary arterial hypertension) versus artifactual transmission of adjacent arterial flow. Signed, [id number] Electronically Signed   By: HeathMyrle Sheng  Laurence Ferrari M.D.   On: 02/09/2017 16:48   US Paracentesis  Result Date: 03/08/2017 INDICATION: Ascites EXAM: ULTRASOUND GUIDED  PARACENTESIS MEDICATIONS: None. COMPLICATIONS: None immediate. PROCEDURE: Informed written consent was obtained from the patient after a discussion of the risks, benefits and alternatives to treatment. A timeout was performed prior to the initiation of the procedure. Initial ultrasound scanning demonstrates a large amount of ascites within the right lower abdominal quadrant. The right lower abdomen was prepped and draped in the usual sterile fashion. 1% lidocaine with epinephrine was used for local anesthesia. Following this, a Safe-T-Centesis catheter was introduced. An ultrasound image was saved for documentation purposes. The paracentesis was performed. The catheter was removed and a dressing was applied. The patient tolerated the procedure well without immediate post procedural complication. FINDINGS: A total of  approximately 4.3 L of cloudy yellow fluid was removed. Samples were sent to the laboratory as requested by the clinical team. IMPRESSION: Successful ultrasound-guided paracentesis yielding 4.3 liters of peritoneal fluid. Electronically Signed   By: Marybelle Killings M.D.   On: 03/08/2017 11:46   US Paracentesis  Result Date: 02/27/2017 INDICATION: Ascites EXAM: ULTRASOUND GUIDED  PARACENTESIS MEDICATIONS: None. COMPLICATIONS: None immediate. PROCEDURE: Informed written consent was obtained from the patient after a discussion of the risks, benefits and alternatives to treatment. A timeout was performed prior to the initiation of the procedure. Initial ultrasound scanning demonstrates a small amount of ascites within the right lower abdominal quadrant. The right lower abdomen was prepped and draped in the usual sterile fashion. 1% lidocaine with epinephrine was used for local anesthesia. Following this, a Safe-T-Centesis catheter was introduced. An ultrasound image was saved for documentation purposes. The paracentesis was performed. The catheter was removed and a dressing was applied. The patient tolerated the procedure well without immediate post procedural complication. FINDINGS: A total of approximately 425 cc of clear yellow fluid was removed. IMPRESSION: Successful ultrasound-guided paracentesis yielding 0.425 liters of peritoneal fluid. Electronically Signed   By: Marybelle Killings M.D.   On: 02/27/2017 10:43   Dg Chest Port 1 View  Result Date: 02/08/2017 CLINICAL DATA:  Post thoracentesis. EXAM: PORTABLE CHEST 1 VIEW COMPARISON:  02/07/2017 FINDINGS: Dual lumen large bore central venous catheter in stable position. The cardiac silhouette is enlarged. Mediastinal contours appear intact. There is no evidence of pneumothorax. Interval decrease in the right pleural effusion which is now moderate, post thoracentesis. Low lung volumes. Bibasilar atelectatic changes. Probable small left pleural effusion. Osseous  structures are without acute abnormality. Soft tissues are grossly normal. IMPRESSION: Interval decrease in the size of the right pleural effusion, which is now moderate in size. No pneumothorax. Probable small left pleural effusion. Bibasilar airspace consolidation versus atelectasis. Low lung volumes. Enlarged cardiac silhouette. Electronically Signed   By: Fidela Salisbury M.D.   On: 02/08/2017 16:44   Ct Renal Stone Study  Result Date: 03/07/2017 CLINICAL DATA:  Diffuse abdominal pain EXAM: CT ABDOMEN AND PELVIS WITHOUT CONTRAST TECHNIQUE: Multidetector CT imaging of the abdomen and pelvis was performed following the standard protocol without IV contrast. COMPARISON:  02/06/2017 CT, 02/27/2017 ultrasound-guided paracentesis FINDINGS: Lower chest: Moderate loculated right pleural effusion though decreased in appearance from prior exam. There may be some rounded atelectasis in the right lower lobe with compressive atelectasis as well. Heart is enlarged without pericardial effusion. The chambers of the heart are more conspicuous likely due to anemia. Coronary arteriosclerosis seen. Partially visualized central line tip in the distal SVC. Hepatobiliary: Stable cirrhotic appearance of the liver without  space-occupying mass. Mixed attenuation within the gallbladder is likely due to enterohepatic recirculation of contrast and can obscure calculi, sludge and lesions. No gallbladder distention wall thickening is seen. No biliary dilatation is identified. Pancreas: Atrophic without mass or ductal dilatation. Spleen: Normal Adrenals/Urinary Tract: Normal adrenal glands. Atrophic appearing kidneys without obstructive uropathy. Decompressed bladder. Stomach/Bowel: Fluid-filled distention of the stomach with scant amount of oral contrast seen within. There is normal small bowel rotation. There is marked displacement of nondistended bowel loops secondary to a large 19.3 x 21.2 x 3.6 cm volume of ascites occupying much of  the left hemiabdomen and right lower quadrant extending into the pelvis. It is somewhat loculated in appearance. Sequela of a peritonitis is not entirely excluded given stranding of the mesentery. No definite evidence of bowel signature noted within to suggest a fluid-filled dilated loop of bowel. Normal-appearing appendix. Vascular/Lymphatic: The minimal aortic atherosclerosis without aneurysm. No pelvic or retroperitoneal adenopathy. Reproductive: Normal size prostate and seminal vesicles. Other: Large volume of ascites as described occupying much of the left hemiabdomen and pelvis. Small amount of fluid outlines the liver and right pericolic gutter. Third spacing of fluid with mild anasarca. Musculoskeletal: Lumbar spondylosis. No acute nor suspicious osseous abnormalities. IMPRESSION: 1. Moderate though smaller appearing loculated right pleural effusion with adjacent atelectasis. 2. Large volume of ascites measuring 19.3 x 21.2 x 3.6 cm occupying much the left hemiabdomen and pelvis displacing adjacent bowel loops. This has significantly increased from comparison CT. This appears somewhat loculated in appearance and sequela of a peritonitis is not entirely excluded given stranding of the mesentery with edema. No bowel obstruction or inflammation. 3. Cirrhotic appearance of the liver without space-occupying mass. 4. Soft tissue anasarca. Electronically Signed   By: Ashley Royalty M.D.   On: 03/07/2017 20:14   US Thoracentesis Asp Pleural Space W/img Guide  Result Date: 02/08/2017 INDICATION: Symptomatic right sided pleural effusion EXAM: US THORACENTESIS ASP PLEURAL SPACE W/IMG GUIDE COMPARISON:  None. MEDICATIONS: None. COMPLICATIONS: None immediate. TECHNIQUE: Informed written consent was obtained from the patient after a discussion of the risks, benefits and alternatives to treatment. A timeout was performed prior to the initiation of the procedure. 4.2 Initial ultrasound scanning demonstrates a large  anechoic right-sided pleural effusion. The lower chest was prepped and draped in the usual sterile fashion. 1% lidocaine was used for local anesthesia. An ultrasound image was saved for documentation purposes. An 8 Fr Safe-T-Centesis catheter was introduced. The thoracentesis was performed. The catheter was removed and a dressing was applied. The patient tolerated the procedure well without immediate post procedural complication. The patient was escorted to have an upright chest radiograph. FINDINGS: A total of approximately 4.2 liters of serous fluid was removed. Requested samples were sent to the laboratory. IMPRESSION: Successful ultrasound-guided right sided thoracentesis yielding 4.2 liters of pleural fluid. Electronically Signed   By: Sandi Mariscal M.D.   On: 02/08/2017 17:06    Assessment:   Juan Roberson. is a 46 y.o. male with ESRD,  DM, Cardiac cirrhosis, prior MRSA knee infection (now resolved) recurrent admissionw with abd pain, abd distention, recurrent ascites and increasing pleural effusion on R with loculaton on imaging.[name]  He is HIV, Hep B and C negative.  Recent [facility] admit for cardiac arrest during eye surgery.  Had thora at that time with evidence of transudate.   He had CT abd done this admission with loculation of R pleural effusion and also some loculated appearance of ascites. All prior cultures have  been negative.  He was treated with ceftazidime at HD for 10 days but not on oral suppressive therapy. Today had paracentesis. Studies pending.   Recommendations Await studies Change to ceftazidime pending cultures.   Thank you very much for allowing me to participate in the care of this patient. Please call with questions.   Cheral Marker. Ola Spurr, MD

## 2017-03-08 NOTE — Progress Notes (Signed)
PT Cancellation Note  Patient Details Name: Queen BlossomWaltzie L Kief Jr. MRN: [id number] DOB: 07/25/1971   Cancelled Treatment:    Reason Eval/Treat Not Completed: Patient at procedure or test/unavailable.  Pt currently off floor for paracentesis.  Will attempt to see pt again later today, schedule permitting.   Encarnacion ChuAshley Rada Zegers PT, DPT 03/08/2017, 10:44 AM

## 2017-03-08 NOTE — Consult Note (Signed)
Pharmacy Antibiotic Note  Queen BlossomWaltzie L Ballew Jr. is a 46 y.o. male admitted on 03/07/2017 with SBP.  Pharmacy has been consulted for ceftazidime dosing.  Plan: ceftazidime 2g q TTS with HD  Height: 6\' 2"  (188 cm) Weight: 173 lb 4.8 oz (78.6 kg) IBW/kg (Calculated) : 82.2  Temp (24hrs), Avg:98 F (36.7 C), Min:97.8 F (36.6 C), Max:98.2 F (36.8 C)   Recent Labs Lab 03/07/17 1820 03/08/17 0438  WBC 6.8 5.7  CREATININE 4.58* 4.75*    Estimated Creatinine Clearance: 21.1 mL/min (A) (by C-G formula based on SCr of 4.75 mg/dL (H)).    Allergies  Allergen Reactions  . Sulfur Other (See Comments)    Pt states that this medication causes renal failure.      Antimicrobials this admission: ceftriaxone 6/6 >> 6/7 ceftaz 6/7 >>   Dose adjustments this admission:   Microbiology results: 6/7 body fluid:  Thank you for allowing pharmacy to be a part of this patient's care.  Olene FlossMelissa D Mikena Masoner, Pharm.D, BCPS Clinical Pharmacist  03/08/2017 2:09 PM

## 2017-03-08 NOTE — Progress Notes (Signed)
Post dialysis assessment 

## 2017-03-08 NOTE — Progress Notes (Signed)
HD STARTED  

## 2017-03-08 NOTE — Evaluation (Signed)
Physical Therapy Evaluation Patient Details Name: Juan Roberson. MRN: [id number] DOB: Dec 23, 1970 Today's Date: 03/08/2017   History of Present Illness  Pt is a 46 y/o M who presents with abdominal pain.  Pt admitted with spontaneous bacterial peritonitis.  Pt's PMH includes chronic systolic heart failure, recent cardiac arrest, MI, COPD, a-fib, ESRD on HD, multiple admission for SBP.     Clinical Impression  Pt admitted with above diagnosis. Pt currently with functional limitations due to the deficits listed below (see PT Problem List). Juan Roberson is independently bathing, dressing and ambulating with supervision at home.  He currently requires min guard for sit>stand transfers and supervision for short distance ambulation before fatiguing.  Pt will benefit from skilled PT to increase their independence and safety with mobility to allow discharge to the venue listed below.      Follow Up Recommendations Home health PT    Equipment Recommendations  None recommended by PT    Recommendations for Other Services       Precautions / Restrictions Precautions Precautions: Fall Restrictions Weight Bearing Restrictions: No      Mobility  Bed Mobility Overal bed mobility: Independent             General bed mobility comments: No assist or cues needed, no physical assist needed.  Transfers Overall transfer level: Needs assistance Equipment used: Rolling walker (2 wheeled) Transfers: Sit to/from Stand Sit to Stand: Min guard         General transfer comment: Slow to stand and pt mildly unsteady but no LOB.  Close min guard for safety.  Ambulation/Gait Ambulation/Gait assistance: Supervision Ambulation Distance (Feet): 60 Feet Assistive device: Rolling walker (2 wheeled) Gait Pattern/deviations: Step-through pattern;Decreased stride length;Trunk flexed Gait velocity: decreased Gait velocity interpretation: Below normal speed for age/gender General Gait Details: Supervision  for safety.  Pt with difficulty navigating around obstacles in room due to impaired vision.  Pt fatigues after ambulating ~45 ft.   Stairs            Wheelchair Mobility    Modified Rankin (Stroke Patients Only)       Balance Overall balance assessment: Needs assistance Sitting-balance support: No upper extremity supported;Feet supported Sitting balance-Leahy Scale: Good     Standing balance support: No upper extremity supported;During functional activity Standing balance-Leahy Scale: Fair Standing balance comment: Pt able to stand statically without UE support but requires UE support for dynamic activities.                             Pertinent Vitals/Pain Pain Assessment: No/denies pain    Home Living Family/patient expects to be discharged to:: Private residence Living Arrangements: Children (daughter) Available Help at Discharge: Family;Available 24 hours/day Type of Home: House Home Access: Stairs to enter   [employer] of Steps: 2 Home Layout: One level Home Equipment: Walker - 2 wheels;Bedside commode;Shower seat;Hand held shower head      Prior Function Level of Independence: Independent         Comments: Pt ind with bathing, dressing.  Daughter does the cooking, cleaning, driving.     Hand Dominance        Extremity/Trunk Assessment   Upper Extremity Assessment Upper Extremity Assessment:  (BUE strength grossly 4/5)    Lower Extremity Assessment Lower Extremity Assessment:  (BLE strength grossly 4/5)    Cervical / Trunk Assessment Cervical / Trunk Assessment: Kyphotic  Communication   Communication: No difficulties  Cognition Arousal/Alertness: Awake/alert Behavior During Therapy: Flat affect Overall Cognitive Status: Within Functional Limits for tasks assessed                                        General Comments      Exercises General Exercises - Lower Extremity Hip Flexion/Marching:  Both;10 reps;Seated;Strengthening   Assessment/Plan    PT Assessment Patient needs continued PT services  PT Problem List Decreased strength;Decreased activity tolerance;Decreased balance;Decreased knowledge of use of DME;Decreased safety awareness       PT Treatment Interventions DME instruction;Gait training;Stair training;Functional mobility training;Therapeutic activities;Therapeutic exercise;Balance training;Patient/family education;Neuromuscular re-education    PT Goals (Current goals can be found in the Care Plan section)  Acute Rehab PT Goals Patient Stated Goal: to be able to ambulate without supervision PT Goal Formulation: With patient Time For Goal Achievement: 03/22/17 Potential to Achieve Goals: Good    Frequency Min 2X/week   Barriers to discharge        Co-evaluation               AM-PAC PT "6 Clicks" Daily Activity  Outcome Measure Difficulty turning over in bed (including adjusting bedclothes, sheets and blankets)?: None Difficulty moving from lying on back to sitting on the side of the bed? : None Difficulty sitting down on and standing up from a chair with arms (e.g., wheelchair, bedside commode, etc,.)?: A Little Help needed moving to and from a bed to chair (including a wheelchair)?: A Little Help needed walking in hospital room?: A Little Help needed climbing 3-5 steps with a railing? : A Little 6 Click Score: 20    End of Session Equipment Utilized During Treatment: Gait belt Activity Tolerance: Patient tolerated treatment well;Patient limited by fatigue Patient left: in chair;with call bell/phone within reach;with chair alarm set Nurse Communication: Mobility status PT Visit Diagnosis: Muscle weakness (generalized) (M62.81);Unsteadiness on feet (R26.81)    Time: [id number] PT Time Calculation (min) (ACUTE ONLY): 17 min   Charges:   PT Evaluation $PT Eval Low Complexity: 1 Procedure     PT G Codes:        Encarnacion ChuAshley Taria Castrillo PT,  DPT 03/08/2017, 1:34 PM

## 2017-03-08 NOTE — Progress Notes (Signed)
MEDICATION RELATED CONSULT NOTE - INITIAL   Pharmacy Consult for screening for QTc prolonging medication Indication: Cardiac arrest on anesthesia on prior visit  Allergies  Allergen Reactions  . Sulfur Other (See Comments)    Pt states that this medication causes renal failure.      Patient Measurements: Height: 6\' 2"  (188 cm) Weight: 173 lb 4.8 oz (78.6 kg) IBW/kg (Calculated) : 82.2 Adjusted Body Weight: 78.6 kg  Vital Signs: Temp: 98.2 F (36.8 C) (06/06 2252) Temp Source: Oral (06/06 2252) BP: 114/84 (06/06 2252) Pulse Rate: 104 (06/06 2252) Intake/Output from previous day: No intake/output data recorded. Intake/Output from this shift: No intake/output data recorded.  Labs:  Recent Labs  03/07/17 1820  WBC 6.8  HGB 8.7*  HCT 28.2*  PLT 249  CREATININE 4.58*  ALBUMIN 2.9*  PROT 7.2  AST 24  ALT 16*  ALKPHOS 57  BILITOT 1.8*   Estimated Creatinine Clearance: 21.9 mL/min (A) (by C-G formula based on SCr of 4.58 mg/dL (H)).   Microbiology: Recent Results (from the past 720 hour(s))  Body fluid culture     Status: None   Collection Time: 02/06/17 11:11 AM  Result Value Ref Range Status   Specimen Description PERITONEAL  Final   Special Requests NONE  Final   Gram Stain   Final    FEW WBC PRESENT,BOTH PMN AND MONONUCLEAR NO ORGANISMS SEEN    Culture   Final    NO GROWTH 3 DAYS Performed at [facility], [address] [place], [place] [address]    Report Status 02/09/2017 FINAL  Final  Culture, body fluid-bottle     Status: None   Collection Time: 02/08/17  3:53 PM  Result Value Ref Range Status   Specimen Description FLUID PLEURAL  Final   Special Requests NONE  Final   Culture   Final    NO GROWTH 5 DAYS Performed at [facility], [address] [place], [place] [address]    Report Status 02/13/2017 FINAL  Final  Gram stain     Status: None   Collection Time: 02/08/17  3:53 PM  Result Value Ref Range Status   Specimen  Description FLUID PLEURAL  Final   Special Requests NONE  Final   Gram Stain   Final    ABUNDANT WBC PRESENT,BOTH PMN AND MONONUCLEAR NO ORGANISMS SEEN Performed at [facility], [address] [place], [place] [address]    Report Status 02/08/2017 FINAL  Final    Medical History: Past Medical History:  Diagnosis Date  . Cellulitis and abscess of foot    Left-Dr. Ether GriffinsFowler  . CHF (congestive heart failure) (HCC)    EF 20%  . CKD (chronic kidney disease), stage III   . Diabetes mellitus without complication (HCC)    a. Dx ~ 1996.  [name]. Dysrhythmia   . Essential hypertension   . Gastritis    a. 04/2015 hematemesis -> EGD: gastritis, esophagitis, duodenitis.  No active bleeding.  PPI added.  [name]. GERD (gastroesophageal reflux disease)   . Left leg DVT (HCC)    a. Dx 05/2015 -> Coumadin.  . MI (myocardial infarction) (HCC)   . Osteomyelitis (HCC)    a. 05/2015 L foot.    Medications:  Scheduled:  . amiodarone  200 mg Oral Daily  . aspirin EC  81 mg Oral Daily  . fluticasone furoate-vilanterol  1 puff Inhalation Daily  . heparin  5,000 Units Subcutaneous Q8H  . insulin aspart  0-15 Units Subcutaneous TID WC  . insulin aspart  0-5 Units Subcutaneous QHS  . methimazole  10 mg Oral Daily  . midodrine  5 mg Oral Q T,Th,Sa-HD  . multivitamin  1 tablet Oral QHS  . pantoprazole  40 mg Oral Daily  . sertraline  50 mg Oral Daily  . sodium chloride flush  3 mL Intravenous Q12H    Assessment: Patient admitted for SBP, h/o CA on anesthesia found to have a QTc of 509. Pharmacy consulted to monitor and screen for QTc prolonging medications.  Goal of Therapy:  Normalization of QTc to 470 msec  Plan:  Patient is on amiodarone 200 mg for h/o afib, sertraline, and breo ellipta. These in combo with amiodarone can increase the QTc. Spoke with MD and recommended to hold off on breo ellipta and to follow up on QTc by monitoring EKG weekly.  Thank you for this consult.  Thomasene Ripple,  PharmD, BCPS Clinical Pharmacist 03/08/2017

## 2017-03-08 NOTE — Progress Notes (Signed)
[facility]  ROUNDING NOTE   Subjective:  Patient well known to [place]. Patient was to have recent eye surgery but unfortunately developed cardiac arrest as a result of the anesthesia that was administered. After his discharge he began having diffuse abdominal pain. He underwent paracentesis yesterday. He's had multiple prior episodes of spontaneous bacterial peritonitis. It appears that his abdominal cavity had loculations. Currently his pain is significantly diminished.   Objective:  Vital signs in last 24 hours:  Temp:  [97.8 F (36.6 C)-98.2 F (36.8 C)] 98 F (36.7 C) (06/07 1149) Pulse Rate:  [101-107] 104 (06/07 1149) Resp:  [14-25] 18 (06/07 1149) BP: (98-120)/(61-93) 104/77 (06/07 1149) SpO2:  [92 %-100 %] 98 % (06/07 1149) Weight:  [78.6 kg (173 lb 4.8 oz)-88.9 kg (196 lb)] 78.6 kg (173 lb 4.8 oz) (06/06 2252)  Weight change:  Filed Weights   03/07/17 1811 03/07/17 2252  Weight: 88.9 kg (196 lb) 78.6 kg (173 lb 4.8 oz)    Intake/Output: I/O last 3 completed shifts: In: 50 [IV Piggyback:50] Out: 100 [Urine:100]   Intake/Output this shift:  No intake/output data recorded.  Physical Exam: General: No acute distress  Head: Normocephalic, atraumatic. Moist oral mucosal membranes  Eyes: Anicteric  Neck: Supple, trachea midline  Lungs:  Clear to auscultation, normal effort  Heart: S1S2 no rubs  Abdomen:  Very minimal distention, mild diffuse tenderness   Extremities: Trace peripheral edema.  Neurologic: Awake, alert, following commands  Skin: No lesions  Access: L IJ permcath    Basic Metabolic Panel:  Recent Labs Lab 03/07/17 1820 03/08/17 0438  NA 133* 133*  K 4.9 4.8  CL 97* 98*  CO2 23 23  GLUCOSE 175* 147*  BUN 41* 46*  CREATININE 4.58* 4.75*  CALCIUM 9.1 8.9    Liver Function Tests:  Recent Labs Lab 03/07/17 1820  AST 24  ALT 16*  ALKPHOS 57  BILITOT 1.8*  PROT 7.2  ALBUMIN 2.9*    Recent Labs Lab 03/07/17 1820   LIPASE 17    Recent Labs Lab 03/07/17 1820  AMMONIA 21    CBC:  Recent Labs Lab 03/07/17 1820 03/08/17 0438  WBC 6.8 5.7  NEUTROABS 4.9  --   HGB 8.7* 8.7*  HCT 28.2* 27.4*  MCV 84.9 85.8  PLT 249 198    Cardiac Enzymes:  Recent Labs Lab 03/07/17 1820  TROPONINI 0.03*    BNP: Invalid input(s): POCBNP  CBG:  Recent Labs Lab 03/07/17 2340 03/08/17 0736 03/08/17 1211  GLUCAP 147* 125* 110*    Microbiology: Results for orders placed or performed during the hospital encounter of 02/06/17  Body fluid culture     Status: None   Collection Time: 02/06/17 11:11 AM  Result Value Ref Range Status   Specimen Description PERITONEAL  Final   Special Requests NONE  Final   Gram Stain   Final    FEW WBC PRESENT,BOTH PMN AND MONONUCLEAR NO ORGANISMS SEEN    Culture   Final    NO GROWTH 3 DAYS Performed at [facility], [address] [place], [place] [address]    Report Status 02/09/2017 FINAL  Final  Culture, body fluid-bottle     Status: None   Collection Time: 02/08/17  3:53 PM  Result Value Ref Range Status   Specimen Description FLUID PLEURAL  Final   Special Requests NONE  Final   Culture   Final    NO GROWTH 5 DAYS Performed at [facility], [address], [place], [place] [address]    Report Status 02/13/2017 FINAL  Final  Gram stain     Status: None   Collection Time: 02/08/17  3:53 PM  Result Value Ref Range Status   Specimen Description FLUID PLEURAL  Final   Special Requests NONE  Final   Gram Stain   Final    ABUNDANT WBC PRESENT,BOTH PMN AND MONONUCLEAR NO ORGANISMS SEEN Performed at [facility], [address] [place], [place] [address]    Report Status 02/08/2017 FINAL  Final    Coagulation Studies: No results for input(s): LABPROT, INR in the last 72 hours.  Urinalysis: No results for input(s): COLORURINE, LABSPEC, PHURINE, GLUCOSEU, HGBUR, BILIRUBINUR, KETONESUR, PROTEINUR, UROBILINOGEN, NITRITE,  LEUKOCYTESUR in the last 72 hours.  Invalid input(s): APPERANCEUR    Imaging: US Paracentesis  Result Date: 03/08/2017 INDICATION: Ascites EXAM: ULTRASOUND GUIDED  PARACENTESIS MEDICATIONS: None. COMPLICATIONS: None immediate. PROCEDURE: Informed written consent was obtained from the patient after a discussion of the risks, benefits and alternatives to treatment. A timeout was performed prior to the initiation of the procedure. Initial ultrasound scanning demonstrates a large amount of ascites within the right lower abdominal quadrant. The right lower abdomen was prepped and draped in the usual sterile fashion. 1% lidocaine with epinephrine was used for local anesthesia. Following this, a Safe-T-Centesis catheter was introduced. An ultrasound image was saved for documentation purposes. The paracentesis was performed. The catheter was removed and a dressing was applied. The patient tolerated the procedure well without immediate post procedural complication. FINDINGS: A total of approximately 4.3 L of cloudy yellow fluid was removed. Samples were sent to the laboratory as requested by the clinical team. IMPRESSION: Successful ultrasound-guided paracentesis yielding 4.3 liters of peritoneal fluid. Electronically Signed   By: Jolaine Click M.D.   On: 03/08/2017 11:46   Ct Renal Stone Study  Result Date: 03/07/2017 CLINICAL DATA:  Diffuse abdominal pain EXAM: CT ABDOMEN AND PELVIS WITHOUT CONTRAST TECHNIQUE: Multidetector CT imaging of the abdomen and pelvis was performed following the standard protocol without IV contrast. COMPARISON:  02/06/2017 CT, 02/27/2017 ultrasound-guided paracentesis FINDINGS: Lower chest: Moderate loculated right pleural effusion though decreased in appearance from prior exam. There may be some rounded atelectasis in the right lower lobe with compressive atelectasis as well. Heart is enlarged without pericardial effusion. The chambers of the heart are more conspicuous likely due to  anemia. Coronary arteriosclerosis seen. Partially visualized central line tip in the distal SVC. Hepatobiliary: Stable cirrhotic appearance of the liver without space-occupying mass. Mixed attenuation within the gallbladder is likely due to enterohepatic recirculation of contrast and can obscure calculi, sludge and lesions. No gallbladder distention wall thickening is seen. No biliary dilatation is identified. Pancreas: Atrophic without mass or ductal dilatation. Spleen: Normal Adrenals/Urinary Tract: Normal adrenal glands. Atrophic appearing kidneys without obstructive uropathy. Decompressed bladder. Stomach/Bowel: Fluid-filled distention of the stomach with scant amount of oral contrast seen within. There is normal small bowel rotation. There is marked displacement of nondistended bowel loops secondary to a large 19.3 x 21.2 x 3.6 cm volume of ascites occupying much of the left hemiabdomen and right lower quadrant extending into the pelvis. It is somewhat loculated in appearance. Sequela of a peritonitis is not entirely excluded given stranding of the mesentery. No definite evidence of bowel signature noted within to suggest a fluid-filled dilated loop of bowel. Normal-appearing appendix. Vascular/Lymphatic: The minimal aortic atherosclerosis without aneurysm. No pelvic or retroperitoneal adenopathy. Reproductive: Normal size prostate and  seminal vesicles. Other: Large volume of ascites as described occupying much of the left hemiabdomen and pelvis. Small amount of fluid outlines the liver and right pericolic gutter. Third spacing of fluid with mild anasarca. Musculoskeletal: Lumbar spondylosis. No acute nor suspicious osseous abnormalities. IMPRESSION: 1. Moderate though smaller appearing loculated right pleural effusion with adjacent atelectasis. 2. Large volume of ascites measuring 19.3 x 21.2 x 3.6 cm occupying much the left hemiabdomen and pelvis displacing adjacent bowel loops. This has significantly  increased from comparison CT. This appears somewhat loculated in appearance and sequela of a peritonitis is not entirely excluded given stranding of the mesentery with edema. No bowel obstruction or inflammation. 3. Cirrhotic appearance of the liver without space-occupying mass. 4. Soft tissue anasarca. Electronically Signed   By: Tollie Ethavid  Kwon M.D.   On: 03/07/2017 20:14     Medications:   . sodium chloride    . cefTRIAXone (ROCEPHIN) IVPB 1 gram/50 mL D5W     . amiodarone  200 mg Oral Daily  . aspirin EC  81 mg Oral Daily  . heparin  5,000 Units Subcutaneous Q8H  . insulin aspart  0-15 Units Subcutaneous TID WC  . insulin aspart  0-5 Units Subcutaneous QHS  . methimazole  10 mg Oral Daily  . midodrine  5 mg Oral Q T,Th,Sa-HD  . multivitamin  1 tablet Oral QHS  . pantoprazole  40 mg Oral Daily  . sertraline  50 mg Oral Daily  . sodium chloride flush  3 mL Intravenous Q12H   sodium chloride, acetaminophen **OR** acetaminophen, albuterol, bisacodyl, diphenhydrAMINE, ipratropium, magnesium citrate, ondansetron **OR** ondansetron (ZOFRAN) IV, oxyCODONE, senna-docusate, sodium chloride flush, zolpidem  Assessment/ Plan:  46 y.o. male with hypertension, diabetes mellitus type 2, ESRD on HD, peripheral neuropathy, atrial fibrillation, multiple episodes of SBP, depression, hyperthyroidism, cardiac arrest during planned eye surgery secondary to anesthesia, severe CHF  [name]   1. ESRD on HD TTS: Patient due for hemodialysis today.  Ultrafiltration target is 1.5 kg.  2. Anemia chronic kidney disease. Hemoglobin currently 8.7. Administer Epogen 10,000 units IV with dialysis.  3. Secondary hyperparathyroidism. Check phosphorus with hemodialysis.  4. Spontaneous bacterial peritonitis. The patient has had a number recurrent episodes of peritonitis. Gastroenterology has been consulted.  Patient currently on ceftriaxone.   LOS: 1 Juan Roberson 6/7/201812:59 PM

## 2017-03-08 NOTE — Progress Notes (Addendum)
[facility] [place] [facility]   PATIENT NAME: Juan Roberson    MR#:  [id number]  DATE OF BIRTH:  28-Jan-1971  SUBJECTIVE:  CHIEF COMPLAINT:   Chief Complaint  Patient presents with  . Emesis  . Abdominal Pain   - Admitted with abdominal pain and distention. Scheduled for paracentesis today. -On IV antibiotics for possible SBP  REVIEW OF SYSTEMS:  Review of Systems  Constitutional: Positive for malaise/fatigue. Negative for chills and fever.  HENT: Negative for congestion, ear discharge, hearing loss and nosebleeds.   Eyes: Negative for blurred vision and double vision.  Respiratory: Negative for cough, shortness of breath and wheezing.   Cardiovascular: Negative for chest pain, palpitations and leg swelling.  Gastrointestinal: Positive for abdominal pain and nausea. Negative for constipation, diarrhea and vomiting.  Genitourinary: Negative for dysuria and urgency.  Musculoskeletal: Negative for myalgias.  Neurological: Negative for dizziness, sensory change, speech change, focal weakness, seizures and headaches.  Psychiatric/Behavioral: Negative for depression.    DRUG ALLERGIES:   Allergies  Allergen Reactions  . Sulfur Other (See Comments)    Pt states that this medication causes renal failure.      VITALS:  Blood pressure 104/77, pulse (!) 104, temperature 98 F (36.7 C), temperature source Oral, resp. rate 18, height 6\' 2"  (1.88 m), weight 78.6 kg (173 lb 4.8 oz), SpO2 98 %.  PHYSICAL EXAMINATION:  Physical Exam  GENERAL:  46 y.o.-year-old patient lying in the bed with no acute distress.  EYES: Pupils equal, round, reactive to light and accommodation. Minimal  scleral icterus. Extraocular muscles intact.  HEENT: Head atraumatic, normocephalic. Oropharynx and nasopharynx clear.  NECK:  Supple, no jugular venous distention. No thyroid enlargement, no tenderness.  LUNGS: Normal breath sounds bilaterally, no wheezing, rales,rhonchi or  crepitation. No use of accessory muscles of respiration. Decreased bibasilar breath sounds CARDIOVASCULAR: S1, S2 normal. No murmurs, rubs, or gallops.  ABDOMEN: Soft, nontender, distended abdomen. hypoactive Bowel sounds present. No organomegaly or mass.  EXTREMITIES: No pedal edema, cyanosis, or clubbing.  NEUROLOGIC: Cranial nerves II through XII are intact. Muscle strength 5/5 in all extremities. Sensation intact. Gait not checked.  PSYCHIATRIC: The patient is alert and oriented x 3.  SKIN: No obvious rash, lesion, or ulcer.    LABORATORY PANEL:   CBC  Recent Labs Lab 03/08/17 0438  WBC 5.7  HGB 8.7*  HCT 27.4*  PLT 198   ------------------------------------------------------------------------------------------------------------------  Chemistries   Recent Labs Lab 03/07/17 1820 03/08/17 0438  NA 133* 133*  K 4.9 4.8  CL 97* 98*  CO2 23 23  GLUCOSE 175* 147*  BUN 41* 46*  CREATININE 4.58* 4.75*  CALCIUM 9.1 8.9  AST 24  --   ALT 16*  --   ALKPHOS 57  --   BILITOT 1.8*  --    ------------------------------------------------------------------------------------------------------------------  Cardiac Enzymes  Recent Labs Lab 03/07/17 1820  TROPONINI 0.03*   ------------------------------------------------------------------------------------------------------------------  RADIOLOGY:  US Paracentesis  Result Date: 03/08/2017 INDICATION: Ascites EXAM: ULTRASOUND GUIDED  PARACENTESIS MEDICATIONS: None. COMPLICATIONS: None immediate. PROCEDURE: Informed written consent was obtained from the patient after a discussion of the risks, benefits and alternatives to treatment. A timeout was performed prior to the initiation of the procedure. Initial ultrasound scanning demonstrates a large amount of ascites within the right lower abdominal quadrant. The right lower abdomen was prepped and draped in the usual sterile fashion. 1% lidocaine with epinephrine was used for local  anesthesia. Following this, a Safe-T-Centesis catheter was introduced.  An ultrasound image was saved for documentation purposes. The paracentesis was performed. The catheter was removed and a dressing was applied. The patient tolerated the procedure well without immediate post procedural complication. FINDINGS: A total of approximately 4.3 L of cloudy yellow fluid was removed. Samples were sent to the laboratory as requested by the clinical team. IMPRESSION: Successful ultrasound-guided paracentesis yielding 4.3 liters of peritoneal fluid. Electronically Signed   By: Jolaine ClickArthur  Hoss M.D.   On: 03/08/2017 11:46   Ct Renal Stone Study  Result Date: 03/07/2017 CLINICAL DATA:  Diffuse abdominal pain EXAM: CT ABDOMEN AND PELVIS WITHOUT CONTRAST TECHNIQUE: Multidetector CT imaging of the abdomen and pelvis was performed following the standard protocol without IV contrast. COMPARISON:  02/06/2017 CT, 02/27/2017 ultrasound-guided paracentesis FINDINGS: Lower chest: Moderate loculated right pleural effusion though decreased in appearance from prior exam. There may be some rounded atelectasis in the right lower lobe with compressive atelectasis as well. Heart is enlarged without pericardial effusion. The chambers of the heart are more conspicuous likely due to anemia. Coronary arteriosclerosis seen. Partially visualized central line tip in the distal SVC. Hepatobiliary: Stable cirrhotic appearance of the liver without space-occupying mass. Mixed attenuation within the gallbladder is likely due to enterohepatic recirculation of contrast and can obscure calculi, sludge and lesions. No gallbladder distention wall thickening is seen. No biliary dilatation is identified. Pancreas: Atrophic without mass or ductal dilatation. Spleen: Normal Adrenals/Urinary Tract: Normal adrenal glands. Atrophic appearing kidneys without obstructive uropathy. Decompressed bladder. Stomach/Bowel: Fluid-filled distention of the stomach with scant  amount of oral contrast seen within. There is normal small bowel rotation. There is marked displacement of nondistended bowel loops secondary to a large 19.3 x 21.2 x 3.6 cm volume of ascites occupying much of the left hemiabdomen and right lower quadrant extending into the pelvis. It is somewhat loculated in appearance. Sequela of a peritonitis is not entirely excluded given stranding of the mesentery. No definite evidence of bowel signature noted within to suggest a fluid-filled dilated loop of bowel. Normal-appearing appendix. Vascular/Lymphatic: The minimal aortic atherosclerosis without aneurysm. No pelvic or retroperitoneal adenopathy. Reproductive: Normal size prostate and seminal vesicles. Other: Large volume of ascites as described occupying much of the left hemiabdomen and pelvis. Small amount of fluid outlines the liver and right pericolic gutter. Third spacing of fluid with mild anasarca. Musculoskeletal: Lumbar spondylosis. No acute nor suspicious osseous abnormalities. IMPRESSION: 1. Moderate though smaller appearing loculated right pleural effusion with adjacent atelectasis. 2. Large volume of ascites measuring 19.3 x 21.2 x 3.6 cm occupying much the left hemiabdomen and pelvis displacing adjacent bowel loops. This has significantly increased from comparison CT. This appears somewhat loculated in appearance and sequela of a peritonitis is not entirely excluded given stranding of the mesentery with edema. No bowel obstruction or inflammation. 3. Cirrhotic appearance of the liver without space-occupying mass. 4. Soft tissue anasarca. Electronically Signed   By: Tollie Ethavid  Kwon M.D.   On: 03/07/2017 20:14    EKG:   Orders placed or performed during the hospital encounter of 03/07/17  . EKG 12-Lead  . EKG 12-Lead  . EKG 12-Lead  . EKG 12-Lead  . EKG 12-Lead  . EKG 12-Lead  . EKG 12-Lead    ASSESSMENT AND PLAN:   46 year old male with chronic systolic heart failure, cardiomyopathy with EF of  10%, end-stage renal disease on Tuesday, Thursday and Saturday hemodialysis, diabetes, hypertension, COPD, atrial fibrillation, history of cirrhosis and ascites presents to hospital secondary to worsening abdominal pain and distention.  #  1 presumed spontaneous bacterial peritonitis-ascites with cirrhosis of the liver, CT of the abdomen showing large volume ascites and cirrhosis and anasarca. -Interventional radiology consulted and patient had paracentesis done today. -About 4 L of cloudy yellow fluid was removed and sent to lab for cultures -Continue Rocephin for treatment at this time. His pain has improved.  #2 end-stage renal disease on hemodialysis. -On Tuesday, Thursday and Saturday hemodialysis. Nephrology consulted and planned for dialysis today. -Takes midodrine with dialysis  #3 hyperthyroidism-on Tapazole  #4 Depression and anxiety-continue home medications.  #5 Anemia of chronic disease- hb stable, monitor  #6 DVT Prophylaxis- SQ heparin     All the records are reviewed and case discussed with Care Management/Social Workerr. Management plans discussed with the patient, family and they are in agreement.  CODE STATUS: Full code  TOTAL TIME TAKING CARE OF THIS PATIENT: 37 minutes.   POSSIBLE D/C IN 1-2 DAYS, DEPENDING ON CLINICAL CONDITION.   Erskin Zinda M.D on 03/08/2017 at 1:06 PM  Between 7am to 6pm - Pager - [phone]  After 6pm go to [email or web address] - Social research officer, government  [facility]  Office  [phone]  CC: Primary care physician; Corky Downs, MD

## 2017-03-08 NOTE — Progress Notes (Signed)
Hd completed 

## 2017-03-08 NOTE — Progress Notes (Signed)
Initial Nutrition Assessment  DOCUMENTATION CODES:   Severe malnutrition in context of chronic illness  INTERVENTION:  Provide Nepro Shake po BID, each supplement provides 425 kcal and 19 grams protein.  Provide Premier Protein po once daily, each supplement provides 160 kcal and 30 grams of protein.  Continue Rena-vite QHS.  Encouraged small, frequent meals in setting of poor appetite and early satiety. Discussed options patient can eat at home. Patient amenable to RD ordering snacks between meals TID while in hospital.  NUTRITION DIAGNOSIS:   Malnutrition (Severe) related to chronic illness (CHF, severe ischemic cardiomyopathy, ESRD on HD, ascites) as evidenced by 18 percent weight loss over 4 months, severe depletion of body fat, severe depletion of muscle mass.  GOAL:   Patient will meet greater than or equal to 90% of their needs  MONITOR:   PO intake, Supplement acceptance, Labs, Weight trends, I & O's  REASON FOR ASSESSMENT:   Malnutrition Screening Tool    ASSESSMENT:   46 year old male with PMHx of DM type 2, HTN, ESRD on HD since 01/17/2017, GERD, osteomyelitis left s/p I&D, left leg DVT on coumadin, gastritis, hx MI, severe ischemic cardiomyopathy (EF 10%), chronic systolic CHF, recent cardiac arrest, multiple admissions for SBP presents with abdominal pain admitted with spontaneous bacterial peritonitis.   -Pt s/p US guided paracentesis today removing 4.3 L of cloudy yellow fluid.  Spoke with patient at bedside. He is known to this RD from previous admission. He reports his appetite has initially been better after previous admission but is poor again. He is having abdominal discomfort from the ascites and reports he can only tolerate small amounts of food. He is attempting to eat 3 meals per day but reports only able to take bites. He drinks Premier Protein BID at home. Also takes a renal MVI. Patient reports that at his dialysis center his fluid is not restricted.  Reports he is allowed to drink 2-3 liters per day.   Reports UBW was 212 lbs. Some fluctuation of weight has been due to fluid. Patient was 211.4 lbs on 10/31/2016 and has lost 38.1 lbs (18% body weight) over the past 4 months, which is significant for time frame. Even if only 8.5% of that weight loss was from loss of true body weight and the rest was fluid, would still be significant for time frame. Patient's true body weight likely even lower in setting of ascites.  Medications reviewed and include: amiodarone, Novolog 0-15 units TID with meals, Novolog 0-5 units QHS, Rena-Vit QHS, pantoprazole, ceftriaxone.  Labs reviewed: CBG 110-125, Sodium 133, Chloride 98, BUN 46, Creatinine 4.75. Potassium 4.8. No recent phosphorus.  Nutrition-Focused physical exam completed. Findings are severe fat depletion, severe muscle depletion, and no edema. Noticeable decrease in muscle stores from NFPE on previous admission. Abdomen very distended and taut on exam due to ascites (patient examined prior to being taken to US for paracentesis).  Discussed with RN.  Diet Order:  Diet heart healthy/carb modified Room service appropriate? Yes; Fluid consistency: Thin  Skin:  Reviewed, no issues  Last BM:  PTA (03/03/2017 per chart)  Height:   Ht Readings from Last 1 Encounters:  03/07/17 6\' 2"  (1.88 m)    Weight:   Wt Readings from Last 1 Encounters:  03/07/17 173 lb 4.8 oz (78.6 kg)    Ideal Body Weight:  86.4 kg  BMI:  Body mass index is 22.25 kg/m.  Estimated Nutritional Needs:   Kcal:  2250-2590 (MSJ x 1.3-1.5)  Protein:  100-120 grams (1.3-1.5 grams/kg)  Fluid:  UOP + 1 L  EDUCATION NEEDS:   Education needs addressed (Small, frequent meals)  Helane Rima, MS, RD, LDN Pager: [phone] After Hours Pager: [phone]

## 2017-03-09 ENCOUNTER — Ambulatory Visit (INDEPENDENT_AMBULATORY_CARE_PROVIDER_SITE_OTHER): Payer: BLUE CROSS/BLUE SHIELD | Admitting: Vascular Surgery

## 2017-03-09 LAB — BASIC METABOLIC PANEL
ANION GAP: 10 (ref 5–15)
BUN: 40 mg/dL — ABNORMAL HIGH (ref 6–20)
CALCIUM: 8.3 mg/dL — AB (ref 8.9–10.3)
CO2: 24 mmol/L (ref 22–32)
Chloride: 97 mmol/L — ABNORMAL LOW (ref 101–111)
Creatinine, Ser: 4.41 mg/dL — ABNORMAL HIGH (ref 0.61–1.24)
GFR calc Af Amer: 17 mL/min — ABNORMAL LOW (ref >60)
GFR, EST NON AFRICAN AMERICAN: 15 mL/min — AB (ref >60)
Glucose, Bld: 97 mg/dL (ref 65–99)
Potassium: 4.4 mmol/L (ref 3.5–5.1)
Sodium: 131 mmol/L — ABNORMAL LOW (ref 135–145)

## 2017-03-09 LAB — HEPATITIS B SURFACE ANTIGEN: HEP B S AG: NEGATIVE

## 2017-03-09 LAB — GLUCOSE, CAPILLARY
GLUCOSE-CAPILLARY: 178 mg/dL — AB (ref 65–99)
Glucose-Capillary: 99 mg/dL (ref 65–99)

## 2017-03-09 LAB — HEPATITIS B SURFACE ANTIBODY, QUANTITATIVE: Hepatitis B-Post: <3.1 m[IU]/mL — ABNORMAL LOW (ref >9.9)

## 2017-03-09 LAB — CYTOLOGY - NON PAP

## 2017-03-09 MED ORDER — DOCUSATE SODIUM 100 MG PO CAPS: 100.0000 mg | ORAL_CAPSULE | Freq: Two times a day (BID) | ORAL | Status: DC

## 2017-03-09 MED ORDER — POLYETHYLENE GLYCOL 3350 17 G PO PACK: 17.0000 g | PACK | Freq: Every day | ORAL | Status: DC

## 2017-03-09 MED ORDER — CEFPODOXIME PROXETIL 200 MG PO TABS: 200.0000 mg | ORAL_TABLET | ORAL | 1 refills | Status: DC

## 2017-03-09 NOTE — Progress Notes (Signed)
[facility] - [place] at [facility]   PATIENT NAME: Juan Roberson    MR#:  [id number]  DATE OF BIRTH:  [birth year]  SUBJECTIVE:  CHIEF COMPLAINT:   Chief Complaint  Patient presents with  . Emesis  . Abdominal Pain   - s/p paracentesis yesterday and almost 4L fluid taken out, cultures pending - denies any complaints today - dialysis yesterday- had issues with permacath towards the end.  REVIEW OF SYSTEMS:  Review of Systems  Constitutional: Positive for malaise/fatigue. Negative for chills and fever.  HENT: Negative for congestion, ear discharge, hearing loss and nosebleeds.   Eyes: Negative for blurred vision and double vision.  Respiratory: Negative for cough, shortness of breath and wheezing.   Cardiovascular: Negative for chest pain, palpitations and leg swelling.  Gastrointestinal: Negative for abdominal pain, constipation, diarrhea, nausea and vomiting.  Genitourinary: Negative for dysuria and urgency.  Musculoskeletal: Negative for myalgias.  Neurological: Negative for dizziness, sensory change, speech change, focal weakness, seizures and headaches.  Psychiatric/Behavioral: Negative for depression.    DRUG ALLERGIES:   Allergies  Allergen Reactions  . Sulfur Other (See Comments)    Pt states that this medication causes renal failure.      VITALS:  Blood pressure 102/65, pulse (!) 109, temperature 98.4 F (36.9 C), temperature source Oral, resp. rate 20, height 6\' 2"  (1.88 m), weight 74 kg (163 lb 2.3 oz), SpO2 93 %.  PHYSICAL EXAMINATION:  Physical Exam  GENERAL:  46 y.o.-year-old patient lying in the bed with no acute distress.  EYES: Pupils equal, round, reactive to light and accommodation. Minimal  scleral icterus. Extraocular muscles intact.  HEENT: Head atraumatic, normocephalic. Oropharynx and nasopharynx clear.  NECK:  Supple, no jugular venous distention. No thyroid enlargement, no tenderness.  LUNGS: Normal breath sounds  bilaterally, no wheezing, rales,rhonchi or crepitation. No use of accessory muscles of respiration. Decreased bibasilar breath sounds CARDIOVASCULAR: S1, S2 normal. No murmurs, rubs, or gallops.  ABDOMEN: Soft, nontender, nondistended. hypoactive Bowel sounds present. No organomegaly or mass.  EXTREMITIES: No pedal edema, cyanosis, or clubbing.  NEUROLOGIC: Cranial nerves II through XII are intact. Muscle strength 5/5 in all extremities. Sensation intact. Gait not checked.  PSYCHIATRIC: The patient is alert and oriented x 3.  SKIN: No obvious rash, lesion, or ulcer.    LABORATORY PANEL:   CBC  Recent Labs Lab 03/08/17 0438  WBC 5.7  HGB 8.7*  HCT 27.4*  PLT 198   ------------------------------------------------------------------------------------------------------------------  Chemistries   Recent Labs Lab 03/07/17 1820  03/09/17 0447  NA 133*  < > 131*  K 4.9  < > 4.4  CL 97*  < > 97*  CO2 23  < > 24  GLUCOSE 175*  < > 97  BUN 41*  < > 40*  CREATININE 4.58*  < > 4.41*  CALCIUM 9.1  < > 8.3*  AST 24  --   --   ALT 16*  --   --   ALKPHOS 57  --   --   BILITOT 1.8*  --   --   < > = values in this interval not displayed. ------------------------------------------------------------------------------------------------------------------  Cardiac Enzymes  Recent Labs Lab 03/07/17 1820  TROPONINI 0.03*   ------------------------------------------------------------------------------------------------------------------  RADIOLOGY:  US Paracentesis  Result Date: 03/08/2017 INDICATION: Ascites EXAM: ULTRASOUND GUIDED  PARACENTESIS MEDICATIONS: None. COMPLICATIONS: None immediate. PROCEDURE: Informed written consent was obtained from the patient after a discussion of the risks, benefits and alternatives to treatment. A timeout was performed prior  to the initiation of the procedure. Initial ultrasound scanning demonstrates a large amount of ascites within the right lower  abdominal quadrant. The right lower abdomen was prepped and draped in the usual sterile fashion. 1% lidocaine with epinephrine was used for local anesthesia. Following this, a Safe-T-Centesis catheter was introduced. An ultrasound image was saved for documentation purposes. The paracentesis was performed. The catheter was removed and a dressing was applied. The patient tolerated the procedure well without immediate post procedural complication. FINDINGS: A total of approximately 4.3 L of cloudy yellow fluid was removed. Samples were sent to the laboratory as requested by the clinical team. IMPRESSION: Successful ultrasound-guided paracentesis yielding 4.3 liters of peritoneal fluid. Electronically Signed   By: Jolaine ClickArthur  Hoss M.D.   On: 03/08/2017 11:46   Ct Renal Stone Study  Result Date: 03/07/2017 CLINICAL DATA:  Diffuse abdominal pain EXAM: CT ABDOMEN AND PELVIS WITHOUT CONTRAST TECHNIQUE: Multidetector CT imaging of the abdomen and pelvis was performed following the standard protocol without IV contrast. COMPARISON:  02/06/2017 CT, 02/27/2017 ultrasound-guided paracentesis FINDINGS: Lower chest: Moderate loculated right pleural effusion though decreased in appearance from prior exam. There may be some rounded atelectasis in the right lower lobe with compressive atelectasis as well. Heart is enlarged without pericardial effusion. The chambers of the heart are more conspicuous likely due to anemia. Coronary arteriosclerosis seen. Partially visualized central line tip in the distal SVC. Hepatobiliary: Stable cirrhotic appearance of the liver without space-occupying mass. Mixed attenuation within the gallbladder is likely due to enterohepatic recirculation of contrast and can obscure calculi, sludge and lesions. No gallbladder distention wall thickening is seen. No biliary dilatation is identified. Pancreas: Atrophic without mass or ductal dilatation. Spleen: Normal Adrenals/Urinary Tract: Normal adrenal glands.  Atrophic appearing kidneys without obstructive uropathy. Decompressed bladder. Stomach/Bowel: Fluid-filled distention of the stomach with scant amount of oral contrast seen within. There is normal small bowel rotation. There is marked displacement of nondistended bowel loops secondary to a large 19.3 x 21.2 x 3.6 cm volume of ascites occupying much of the left hemiabdomen and right lower quadrant extending into the pelvis. It is somewhat loculated in appearance. Sequela of a peritonitis is not entirely excluded given stranding of the mesentery. No definite evidence of bowel signature noted within to suggest a fluid-filled dilated loop of bowel. Normal-appearing appendix. Vascular/Lymphatic: The minimal aortic atherosclerosis without aneurysm. No pelvic or retroperitoneal adenopathy. Reproductive: Normal size prostate and seminal vesicles. Other: Large volume of ascites as described occupying much of the left hemiabdomen and pelvis. Small amount of fluid outlines the liver and right pericolic gutter. Third spacing of fluid with mild anasarca. Musculoskeletal: Lumbar spondylosis. No acute nor suspicious osseous abnormalities. IMPRESSION: 1. Moderate though smaller appearing loculated right pleural effusion with adjacent atelectasis. 2. Large volume of ascites measuring 19.3 x 21.2 x 3.6 cm occupying much the left hemiabdomen and pelvis displacing adjacent bowel loops. This has significantly increased from comparison CT. This appears somewhat loculated in appearance and sequela of a peritonitis is not entirely excluded given stranding of the mesentery with edema. No bowel obstruction or inflammation. 3. Cirrhotic appearance of the liver without space-occupying mass. 4. Soft tissue anasarca. Electronically Signed   By: Tollie Ethavid  Kwon M.D.   On: 03/07/2017 20:14    EKG:   Orders placed or performed during the hospital encounter of 03/07/17  . EKG 12-Lead  . EKG 12-Lead  . EKG 12-Lead  . EKG 12-Lead  . EKG 12-Lead    . EKG 12-Lead  .  EKG 12-Lead    ASSESSMENT AND PLAN:   46 year old male with chronic systolic heart failure, cardiomyopathy with EF of 10%, end-stage renal disease on Tuesday, Thursday and Saturday hemodialysis, diabetes, hypertension, COPD, atrial fibrillation, history of cirrhosis and ascites presents to hospital secondary to worsening abdominal pain and distention.  #1 spontaneous bacterial peritonitis-ascites with cirrhosis of the liver, CT of the abdomen showing large volume ascites and cirrhosis and anasarca. -s/p paracentesis and 4L fluid taken out. Send for cultures- pending, currently on fortaz with dialysis. - will need long term oral ABX for prophylaxis with recurrent SBP - appreciate ID input  #2 end-stage renal disease on hemodialysis. -On Tuesday, Thursday and Saturday hemodialysis. Nephrology consulted -Takes midodrine with dialysis - dialysis yesterday however catheter malfunction towards the end- outpatient follow up. Not fluid overloaded now  #3 hyperthyroidism-on Tapazole  #4 Depression and anxiety-continue home medications.  #5 Anemia of chronic disease- hb stable, monitor  #6 DVT Prophylaxis- SQ heparin     All the records are reviewed and case discussed with Care Management/Social Workerr. Management plans discussed with the patient, family and they are in agreement.  CODE STATUS: Full code  TOTAL TIME TAKING CARE OF THIS PATIENT: 37 minutes.   POSSIBLE D/C TODAY DEPENDING ON CLINICAL CONDITION.   Enid Baas M.D on 03/09/2017 at 11:45 AM  Between 7am to 6pm - Pager - [phone]  After 6pm go to [email or web address] - Social research officer, government  [facility]  Office  [phone]  CC: Primary care physician; Corky Downs, MD

## 2017-03-09 NOTE — Progress Notes (Signed)
MEDICATION RELATED CONSULT NOTE   Pharmacy Consult for screening for QTc prolonging medication Indication: Cardiac arrest on anesthesia on prior visit  Allergies  Allergen Reactions  . Sulfur Other (See Comments)    Pt states that this medication causes renal failure.      Patient Measurements: Height: 6\' 2"  (188 cm) Weight: 163 lb 2.3 oz (74 kg) IBW/kg (Calculated) : 82.2 Adjusted Body Weight: 78.6 kg  Vital Signs: Temp: 98.4 F (36.9 C) (06/08 0540) Temp Source: Oral (06/08 0540) BP: 102/65 (06/08 0540) Pulse Rate: 109 (06/08 0540) Intake/Output from previous day: 06/07 0701 - 06/08 0700 In: 270 [P.O.:220; IV Piggyback:50] Out: 815  Intake/Output from this shift: No intake/output data recorded.  Labs:  Recent Labs  03/07/17 1820 03/08/17 0438 03/08/17 1329 03/09/17 0447  WBC 6.8 5.7  --   --   HGB 8.7* 8.7*  --   --   HCT 28.2* 27.4*  --   --   PLT 249 198  --   --   CREATININE 4.58* 4.75*  --  4.41*  PHOS  --   --  6.2*  --   ALBUMIN 2.9*  --   --   --   PROT 7.2  --   --   --   AST 24  --   --   --   ALT 16*  --   --   --   ALKPHOS 57  --   --   --   BILITOT 1.8*  --   --   --    Estimated Creatinine Clearance: 21.4 mL/min (A) (by C-G formula based on SCr of 4.41 mg/dL (H)).   Microbiology: Recent Results (from the past 720 hour(s))  Culture, body fluid-bottle     Status: None   Collection Time: 02/08/17  3:53 PM  Result Value Ref Range Status   Specimen Description FLUID PLEURAL  Final   Special Requests NONE  Final   Culture   Final    NO GROWTH 5 DAYS Performed at [facility], [address] [place], [place] [address]    Report Status 02/13/2017 FINAL  Final  Gram stain     Status: None   Collection Time: 02/08/17  3:53 PM  Result Value Ref Range Status   Specimen Description FLUID PLEURAL  Final   Special Requests NONE  Final   Gram Stain   Final    ABUNDANT WBC PRESENT,BOTH PMN AND MONONUCLEAR NO ORGANISMS SEEN Performed at  [facility], [address] [place], [place] [address]    Report Status 02/08/2017 FINAL  Final  Body fluid culture     Status: None (Preliminary result)   Collection Time: 03/08/17 11:38 AM  Result Value Ref Range Status   Specimen Description PERITONEAL  Final   Special Requests NONE  Final   Gram Stain   Final    WBC PRESENT, PREDOMINANTLY PMN NO ORGANISMS SEEN CYTOSPIN SMEAR    Culture   Final    NO GROWTH < 24 HOURS Performed at [facility], [address] [place], [place] [address]    Report Status PENDING  Incomplete  MRSA PCR Screening     Status: Abnormal   Collection Time: 03/08/17  3:56 PM  Result Value Ref Range Status   MRSA by PCR POSITIVE (A) NEGATIVE Final    Comment:        The GeneXpert MRSA Assay (FDA approved for NASAL specimens only), is one component  of a comprehensive MRSA colonization surveillance program. It is not intended to diagnose MRSA infection nor to guide or monitor treatment for MRSA infections. CRITICAL RESULT CALLED TO, READ BACK BY AND VERIFIED WITH: ZANETTA HARRIS AT 1729 ON 03/08/2017 JJB     Medical History: Past Medical History:  Diagnosis Date  . Cellulitis and abscess of foot    Left-Dr. Ether Griffins  . CHF (congestive heart failure) (HCC)    EF 20%  . CKD (chronic kidney disease), stage III   . Diabetes mellitus without complication (HCC)    a. Dx ~ 1996.  [name] Dysrhythmia   . Essential hypertension   . Gastritis    a. 04/2015 hematemesis -> EGD: gastritis, esophagitis, duodenitis.  No active bleeding.  PPI added.  [name] GERD (gastroesophageal reflux disease)   . Left leg DVT (HCC)    a. Dx 05/2015 -> Coumadin.  . MI (myocardial infarction) (HCC)   . Osteomyelitis (HCC)    a. 05/2015 L foot.    Medications:  Scheduled:  . amiodarone  200 mg Oral Daily  . aspirin EC  81 mg Oral Daily  . Chlorhexidine Gluconate Cloth  6 each Topical Q0600  . feeding supplement (NEPRO CARB STEADY)  237 mL Oral BID BM  . heparin   5,000 Units Subcutaneous Q8H  . insulin aspart  0-15 Units Subcutaneous TID WC  . insulin aspart  0-5 Units Subcutaneous QHS  . methimazole  10 mg Oral Daily  . midodrine  5 mg Oral Q T,Th,Sa-HD  . multivitamin  1 tablet Oral QHS  . mupirocin ointment  1 application Nasal BID  . pantoprazole  40 mg Oral Daily  . protein supplement shake  11 oz Oral Q24H  . sertraline  50 mg Oral Daily  . sodium chloride flush  3 mL Intravenous Q12H    Assessment: Patient admitted for SBP, h/o CA on anesthesia found to have a QTc of 509. Pharmacy consulted to monitor and screen for QTc prolonging medications.  Goal of Therapy:  Normalization of QTc to 470 msec  Plan:  Patient is on amiodarone 200 mg for h/o afib, sertraline, and breo ellipta. These in combo with amiodarone can increase the QTc. Spoke with MD and recommended to hold off on breo ellipta and to follow up on QTc by monitoring EKG weekly.   Zofran can also affect QTc- patient has not received any in last 2 days per eMAR  Thank you for this consult.  Bari Mantis PharmD Clinical Pharmacist 03/09/2017

## 2017-03-09 NOTE — Progress Notes (Signed)
[facility] CLINIC INFECTIOUS DISEASE PROGRESS NOTE Date of Admission:  03/07/2017     ID: Juan Blossom. is a 46 y.o. male with  possible SBP Active Problems:   SBP (spontaneous bacterial peritonitis) (HCC)   Subjective: No fevers, less pain,  ROS  Eleven systems are reviewed and negative except per hpi  Medications:  Antibiotics Given (last 72 hours)    Date/Time Action Medication Dose Rate   03/07/17 1957 New Bag/Given   cefTRIAXone (ROCEPHIN) 2 g in dextrose 5 % 50 mL IVPB 2 g 100 mL/hr   03/07/17 2212 New Bag/Given   cefTRIAXone (ROCEPHIN) 2 g in dextrose 5 % 50 mL IVPB 2 g 100 mL/hr   03/08/17 1612 New Bag/Given  [given during dialysis]   cefTAZidime (FORTAZ) 2 g in dextrose 5 % 50 mL IVPB 2 g 100 mL/hr     . amiodarone  200 mg Oral Daily  . aspirin EC  81 mg Oral Daily  . Chlorhexidine Gluconate Cloth  6 each Topical Q0600  . feeding supplement (NEPRO CARB STEADY)  237 mL Oral BID BM  . heparin  5,000 Units Subcutaneous Q8H  . insulin aspart  0-15 Units Subcutaneous TID WC  . insulin aspart  0-5 Units Subcutaneous QHS  . methimazole  10 mg Oral Daily  . midodrine  5 mg Oral Q T,Th,Sa-HD  . multivitamin  1 tablet Oral QHS  . mupirocin ointment  1 application Nasal BID  . pantoprazole  40 mg Oral Daily  . protein supplement shake  11 oz Oral Q24H  . sertraline  50 mg Oral Daily  . sodium chloride flush  3 mL Intravenous Q12H    Objective: Vital signs in last 24 hours: Temp:  [97.8 F (36.6 C)-98.6 F (37 C)] 98.4 F (36.9 C) (06/08 0540) Pulse Rate:  [92-109] 109 (06/08 0540) Resp:  [11-20] 20 (06/08 0540) BP: (85-113)/(38-73) 102/65 (06/08 0540) SpO2:  [93 %-97 %] 93 % (06/08 0540) Weight:  [74 kg (163 lb 2.3 oz)-75.1 kg (165 lb 9.1 oz)] 74 kg (163 lb 2.3 oz) (06/07 1630) Constitutional: He is oriented to person, place, and time. Disheveled, chronically ill appearing, very thin HENT: anicteric Mouth/Throat: Oropharynx is clear and dry. No oropharyngeal  exudate.  Cardiovascular: Normal rate, regular rhythm -distant  Pulmonary/Chest:decreased bs bil Abdominal: Soft. Mild distention, mild diffuse ttp  Lymphadenopathy: He has no cervical adenopathy.  Ext 1+ edema HD cath R chest wall wnl Neurological: He is alert and oriented to person, place, and time.  Skin: Skin is warm and dry. No rash noted. No erythema.  Psychiatric: He has a normal mood and affect. His behavior is normal.   Lab Results  Recent Labs  03/07/17 1820 03/08/17 0438 03/09/17 0447  WBC 6.8 5.7  --   HGB 8.7* 8.7*  --   HCT 28.2* 27.4*  --   NA 133* 133* 131*  K 4.9 4.8 4.4  CL 97* 98* 97*  CO2 23 23 24   BUN 41* 46* 40*  CREATININE 4.58* 4.75* 4.41*    Microbiology: @micro @ Studies/Results: US Paracentesis  Result Date: 03/08/2017 INDICATION: Ascites EXAM: ULTRASOUND GUIDED  PARACENTESIS MEDICATIONS: None. COMPLICATIONS: None immediate. PROCEDURE: Informed written consent was obtained from the patient after a discussion of the risks, benefits and alternatives to treatment. A timeout was performed prior to the initiation of the procedure. Initial ultrasound scanning demonstrates a large amount of ascites within the right lower abdominal quadrant. The right lower abdomen was prepped and draped  in the usual sterile fashion. 1% lidocaine with epinephrine was used for local anesthesia. Following this, a Safe-T-Centesis catheter was introduced. An ultrasound image was saved for documentation purposes. The paracentesis was performed. The catheter was removed and a dressing was applied. The patient tolerated the procedure well without immediate post procedural complication. FINDINGS: A total of approximately 4.3 L of cloudy yellow fluid was removed. Samples were sent to the laboratory as requested by the clinical team. IMPRESSION: Successful ultrasound-guided paracentesis yielding 4.3 liters of peritoneal fluid. Electronically Signed   By: Jolaine ClickArthur  Hoss M.D.   On: 03/08/2017  11:46   Ct Renal Stone Study  Result Date: 03/07/2017 CLINICAL DATA:  Diffuse abdominal pain EXAM: CT ABDOMEN AND PELVIS WITHOUT CONTRAST TECHNIQUE: Multidetector CT imaging of the abdomen and pelvis was performed following the standard protocol without IV contrast. COMPARISON:  02/06/2017 CT, 02/27/2017 ultrasound-guided paracentesis FINDINGS: Lower chest: Moderate loculated right pleural effusion though decreased in appearance from prior exam. There may be some rounded atelectasis in the right lower lobe with compressive atelectasis as well. Heart is enlarged without pericardial effusion. The chambers of the heart are more conspicuous likely due to anemia. Coronary arteriosclerosis seen. Partially visualized central line tip in the distal SVC. Hepatobiliary: Stable cirrhotic appearance of the liver without space-occupying mass. Mixed attenuation within the gallbladder is likely due to enterohepatic recirculation of contrast and can obscure calculi, sludge and lesions. No gallbladder distention wall thickening is seen. No biliary dilatation is identified. Pancreas: Atrophic without mass or ductal dilatation. Spleen: Normal Adrenals/Urinary Tract: Normal adrenal glands. Atrophic appearing kidneys without obstructive uropathy. Decompressed bladder. Stomach/Bowel: Fluid-filled distention of the stomach with scant amount of oral contrast seen within. There is normal small bowel rotation. There is marked displacement of nondistended bowel loops secondary to a large 19.3 x 21.2 x 3.6 cm volume of ascites occupying much of the left hemiabdomen and right lower quadrant extending into the pelvis. It is somewhat loculated in appearance. Sequela of a peritonitis is not entirely excluded given stranding of the mesentery. No definite evidence of bowel signature noted within to suggest a fluid-filled dilated loop of bowel. Normal-appearing appendix. Vascular/Lymphatic: The minimal aortic atherosclerosis without aneurysm. No  pelvic or retroperitoneal adenopathy. Reproductive: Normal size prostate and seminal vesicles. Other: Large volume of ascites as described occupying much of the left hemiabdomen and pelvis. Small amount of fluid outlines the liver and right pericolic gutter. Third spacing of fluid with mild anasarca. Musculoskeletal: Lumbar spondylosis. No acute nor suspicious osseous abnormalities. IMPRESSION: 1. Moderate though smaller appearing loculated right pleural effusion with adjacent atelectasis. 2. Large volume of ascites measuring 19.3 x 21.2 x 3.6 cm occupying much the left hemiabdomen and pelvis displacing adjacent bowel loops. This has significantly increased from comparison CT. This appears somewhat loculated in appearance and sequela of a peritonitis is not entirely excluded given stranding of the mesentery with edema. No bowel obstruction or inflammation. 3. Cirrhotic appearance of the liver without space-occupying mass. 4. Soft tissue anasarca. Electronically Signed   By: Tollie Ethavid  Kwon M.D.   On: 03/07/2017 20:14    Assessment/Plan: Juan BlossomWaltzie L Castanon Jr. is a 46 y.o. male with ESRD,  DM, Cardiac cirrhosis, prior MRSA knee infection (now resolved) recurrent admissionw with abd pain, abd distention, recurrent ascites and increasing pleural effusion on R with loculaton on imaging.[name].  He is HIV, Hep B and C negative.  Recent [facility] admit for cardiac arrest during eye surgery.  Had thora at that time with evidence of  transudate.   He had CT abd done this admission with loculation of R pleural effusion and also some loculated appearance of ascites. All prior cultures have been negative.  He was treated with ceftazidime at HD for 10 days but not on oral suppressive therapy. Paracentesis studies showed only 17WBC. Cx neg.   Recommendations FU Cultures Cannot use cipro due to amio and cardiac issues with recent Cardiac arrest Allergic to bactrim Rec oral cefpodoxime for SBP PPX lifelong. Thank you very much for  the consult. Will follow with you.  Adalae Baysinger P   03/09/2017, 12:07 PM

## 2017-03-09 NOTE — Progress Notes (Signed)
[facility]  ROUNDING NOTE   Subjective:  Patient feeling much better today. He did have partial dialysis treatment yesterday. There was some catheter dysfunction noted yesterday. Catheter was packed with cathflo.    Objective:  Vital signs in last 24 hours:  Temp:  [97.8 F (36.6 C)-98.6 F (37 C)] 98.4 F (36.9 C) (06/08 0540) Pulse Rate:  [92-109] 109 (06/08 0540) Resp:  [11-20] 20 (06/08 0540) BP: (85-113)/(38-73) 102/65 (06/08 0540) SpO2:  [93 %-97 %] 93 % (06/08 0540) Weight:  [74 kg (163 lb 2.3 oz)-75.1 kg (165 lb 9.1 oz)] 74 kg (163 lb 2.3 oz) (06/07 1630)  Weight change: -13.805 kg (-30 lb 7 oz) Filed Weights   03/07/17 2252 03/08/17 1410 03/08/17 1630  Weight: 78.6 kg (173 lb 4.8 oz) 75.1 kg (165 lb 9.1 oz) 74 kg (163 lb 2.3 oz)    Intake/Output: I/O last 3 completed shifts: In: 320 [P.O.:220; IV Piggyback:100] Out: 915 [Urine:100; Other:815]   Intake/Output this shift:  No intake/output data recorded.  Physical Exam: General: No acute distress  Head: Normocephalic, atraumatic. Moist oral mucosal membranes  Eyes: Anicteric  Neck: Supple, trachea midline  Lungs:  Clear to auscultation, normal effort  Heart: S1S2 no rubs  Abdomen:  Very minimal distention, no abdominal tenderness  Extremities: Trace peripheral edema.  Neurologic: Awake, alert, following commands  Skin: No lesions  Access: L IJ permcath    Basic Metabolic Panel:  Recent Labs Lab 03/07/17 1820 03/08/17 0438 03/08/17 1329 03/09/17 0447  NA 133* 133*  --  131*  K 4.9 4.8  --  4.4  CL 97* 98*  --  97*  CO2 23 23  --  24  GLUCOSE 175* 147*  --  97  BUN 41* 46*  --  40*  CREATININE 4.58* 4.75*  --  4.41*  CALCIUM 9.1 8.9  --  8.3*  PHOS  --   --  6.2*  --     Liver Function Tests:  Recent Labs Lab 03/07/17 1820  AST 24  ALT 16*  ALKPHOS 57  BILITOT 1.8*  PROT 7.2  ALBUMIN 2.9*    Recent Labs Lab 03/07/17 1820  LIPASE 17    Recent Labs Lab  03/07/17 1820  AMMONIA 21    CBC:  Recent Labs Lab 03/07/17 1820 03/08/17 0438  WBC 6.8 5.7  NEUTROABS 4.9  --   HGB 8.7* 8.7*  HCT 28.2* 27.4*  MCV 84.9 85.8  PLT 249 198    Cardiac Enzymes:  Recent Labs Lab 03/07/17 1820  TROPONINI 0.03*    BNP: Invalid input(s): POCBNP  CBG:  Recent Labs Lab 03/08/17 1211 03/08/17 1826 03/08/17 2138 03/09/17 0742 03/09/17 1152  GLUCAP 110* 104* 116* 99 178*    Microbiology: Results for orders placed or performed during the hospital encounter of 03/07/17  Body fluid culture     Status: None (Preliminary result)   Collection Time: 03/08/17 11:38 AM  Result Value Ref Range Status   Specimen Description PERITONEAL  Final   Special Requests NONE  Final   Gram Stain   Final    WBC PRESENT, PREDOMINANTLY PMN NO ORGANISMS SEEN CYTOSPIN SMEAR    Culture   Final    NO GROWTH < 24 HOURS Performed at [facility], [address] [place], [place] [address]    Report Status PENDING  Incomplete  MRSA PCR Screening     Status: Abnormal   Collection Time: 03/08/17  3:56 PM  Result  Value Ref Range Status   MRSA by PCR POSITIVE (A) NEGATIVE Final    Comment:        The GeneXpert MRSA Assay (FDA approved for NASAL specimens only), is one component of a comprehensive MRSA colonization surveillance program. It is not intended to diagnose MRSA infection nor to guide or monitor treatment for MRSA infections. CRITICAL RESULT CALLED TO, READ BACK BY AND VERIFIED WITH: ZANETTA HARRIS AT 1729 ON 03/08/2017 JJB     Coagulation Studies: No results for input(s): LABPROT, INR in the last 72 hours.  Urinalysis: No results for input(s): COLORURINE, LABSPEC, PHURINE, GLUCOSEU, HGBUR, BILIRUBINUR, KETONESUR, PROTEINUR, UROBILINOGEN, NITRITE, LEUKOCYTESUR in the last 72 hours.  Invalid input(s): APPERANCEUR    Imaging: US Paracentesis  Result Date: 03/08/2017 INDICATION: Ascites EXAM: ULTRASOUND GUIDED  PARACENTESIS  MEDICATIONS: None. COMPLICATIONS: None immediate. PROCEDURE: Informed written consent was obtained from the patient after a discussion of the risks, benefits and alternatives to treatment. A timeout was performed prior to the initiation of the procedure. Initial ultrasound scanning demonstrates a large amount of ascites within the right lower abdominal quadrant. The right lower abdomen was prepped and draped in the usual sterile fashion. 1% lidocaine with epinephrine was used for local anesthesia. Following this, a Safe-T-Centesis catheter was introduced. An ultrasound image was saved for documentation purposes. The paracentesis was performed. The catheter was removed and a dressing was applied. The patient tolerated the procedure well without immediate post procedural complication. FINDINGS: A total of approximately 4.3 L of cloudy yellow fluid was removed. Samples were sent to the laboratory as requested by the clinical team. IMPRESSION: Successful ultrasound-guided paracentesis yielding 4.3 liters of peritoneal fluid. Electronically Signed   By: Jolaine Click M.D.   On: 03/08/2017 11:46   Ct Renal Stone Study  Result Date: 03/07/2017 CLINICAL DATA:  Diffuse abdominal pain EXAM: CT ABDOMEN AND PELVIS WITHOUT CONTRAST TECHNIQUE: Multidetector CT imaging of the abdomen and pelvis was performed following the standard protocol without IV contrast. COMPARISON:  02/06/2017 CT, 02/27/2017 ultrasound-guided paracentesis FINDINGS: Lower chest: Moderate loculated right pleural effusion though decreased in appearance from prior exam. There may be some rounded atelectasis in the right lower lobe with compressive atelectasis as well. Heart is enlarged without pericardial effusion. The chambers of the heart are more conspicuous likely due to anemia. Coronary arteriosclerosis seen. Partially visualized central line tip in the distal SVC. Hepatobiliary: Stable cirrhotic appearance of the liver without space-occupying mass.  Mixed attenuation within the gallbladder is likely due to enterohepatic recirculation of contrast and can obscure calculi, sludge and lesions. No gallbladder distention wall thickening is seen. No biliary dilatation is identified. Pancreas: Atrophic without mass or ductal dilatation. Spleen: Normal Adrenals/Urinary Tract: Normal adrenal glands. Atrophic appearing kidneys without obstructive uropathy. Decompressed bladder. Stomach/Bowel: Fluid-filled distention of the stomach with scant amount of oral contrast seen within. There is normal small bowel rotation. There is marked displacement of nondistended bowel loops secondary to a large 19.3 x 21.2 x 3.6 cm volume of ascites occupying much of the left hemiabdomen and right lower quadrant extending into the pelvis. It is somewhat loculated in appearance. Sequela of a peritonitis is not entirely excluded given stranding of the mesentery. No definite evidence of bowel signature noted within to suggest a fluid-filled dilated loop of bowel. Normal-appearing appendix. Vascular/Lymphatic: The minimal aortic atherosclerosis without aneurysm. No pelvic or retroperitoneal adenopathy. Reproductive: Normal size prostate and seminal vesicles. Other: Large volume of ascites as described occupying much of the left hemiabdomen and pelvis.  Small amount of fluid outlines the liver and right pericolic gutter. Third spacing of fluid with mild anasarca. Musculoskeletal: Lumbar spondylosis. No acute nor suspicious osseous abnormalities. IMPRESSION: 1. Moderate though smaller appearing loculated right pleural effusion with adjacent atelectasis. 2. Large volume of ascites measuring 19.3 x 21.2 x 3.6 cm occupying much the left hemiabdomen and pelvis displacing adjacent bowel loops. This has significantly increased from comparison CT. This appears somewhat loculated in appearance and sequela of a peritonitis is not entirely excluded given stranding of the mesentery with edema. No bowel  obstruction or inflammation. 3. Cirrhotic appearance of the liver without space-occupying mass. 4. Soft tissue anasarca. Electronically Signed   By: Tollie Eth M.D.   On: 03/07/2017 20:14     Medications:   . sodium chloride    . cefTAZidime (FORTAZ)  IV Stopped (03/08/17 1649)   . amiodarone  200 mg Oral Daily  . aspirin EC  81 mg Oral Daily  . Chlorhexidine Gluconate Cloth  6 each Topical Q0600  . feeding supplement (NEPRO CARB STEADY)  237 mL Oral BID BM  . heparin  5,000 Units Subcutaneous Q8H  . insulin aspart  0-15 Units Subcutaneous TID WC  . insulin aspart  0-5 Units Subcutaneous QHS  . methimazole  10 mg Oral Daily  . midodrine  5 mg Oral Q T,Th,Sa-HD  . multivitamin  1 tablet Oral QHS  . mupirocin ointment  1 application Nasal BID  . pantoprazole  40 mg Oral Daily  . protein supplement shake  11 oz Oral Q24H  . sertraline  50 mg Oral Daily  . sodium chloride flush  3 mL Intravenous Q12H   sodium chloride, acetaminophen **OR** acetaminophen, albuterol, bisacodyl, diphenhydrAMINE, ipratropium, magnesium citrate, ondansetron **OR** ondansetron (ZOFRAN) IV, oxyCODONE, senna-docusate, sodium chloride flush, zolpidem  Assessment/ Plan:  46 y.o. male with hypertension, diabetes mellitus type 2, ESRD on HD, peripheral neuropathy, atrial fibrillation, multiple episodes of SBP, depression, hyperthyroidism, cardiac arrest during planned eye surgery secondary to anesthesia, severe CHF  [name]   1. ESRD on HD TTS: Patient did have some catheter dysfunction yesterday. Catheter flow was instilled in the catheter. Patient will have dialysis again tomorrow and if he continues to have issues with a dialysis catheter, this will likely need to be replaced next week.  2. Anemia chronic kidney disease. Continue Epogen as an outpatient.  3. Secondary hyperparathyroidism. Most recent serum phosphorus was 6.2. We plan to repeat this as an outpatient.  4. Spontaneous bacterial  peritonitis. The patient has had a number recurrent episodes of peritonitis. Gastroenterology has been consulted.  Patient likely to be placed on prophylactic antibiotics as an outpatient.   LOS: 2 Morene Cecilio 6/8/20181:03 PM

## 2017-03-09 NOTE — Consult Note (Signed)
Pharmacy Antibiotic Note  Juan BlossomWaltzie L Biggins Jr. is a 46 y.o. male admitted on 03/07/2017 with SBP.  Pharmacy has been consulted for ceftazidime dosing.  Plan: ceftazidime 2g q TTS with HD  Height: 6\' 2"  (188 cm) Weight: 163 lb 2.3 oz (74 kg) IBW/kg (Calculated) : 82.2  Temp (24hrs), Avg:98.2 F (36.8 C), Min:97.8 F (36.6 C), Max:98.6 F (37 C)   Recent Labs Lab 03/07/17 1820 03/08/17 0438 03/09/17 0447  WBC 6.8 5.7  --   CREATININE 4.58* 4.75* 4.41*    Estimated Creatinine Clearance: 21.4 mL/min (A) (by C-G formula based on SCr of 4.41 mg/dL (H)).    Allergies  Allergen Reactions  . Sulfur Other (See Comments)    Pt states that this medication causes renal failure.      Antimicrobials this admission: ceftriaxone 6/6 >> 6/7 ceftaz 6/7 >>   Dose adjustments this admission:   Microbiology results: 6/7 body fluid: pending  Thank you for allowing pharmacy to be a part of this patient's care.  Bari MantisKristin Evalee Gerard PharmD Clinical Pharmacist 03/09/2017

## 2017-03-09 NOTE — Discharge Summary (Signed)
[facility] [place] at [facility]   PATIENT NAME: Juan Roberson    MR#:  [id number]  DATE OF BIRTH:  1970-10-28  DATE OF ADMISSION:  03/07/2017   ADMITTING PHYSICIAN: Tonye Royalty, DO  DATE OF DISCHARGE: 03/09/2017  PRIMARY CARE PHYSICIAN: Corky Downs, MD   ADMISSION DIAGNOSIS:   SBP (spontaneous bacterial peritonitis) (HCC) [K65.2]  DISCHARGE DIAGNOSIS:   Active Problems:   SBP (spontaneous bacterial peritonitis) (HCC)   SECONDARY DIAGNOSIS:   Past Medical History:  Diagnosis Date  . Cellulitis and abscess of foot    Left-Dr. Ether Griffins  . CHF (congestive heart failure) (HCC)    EF 20%  . CKD (chronic kidney disease), stage III   . Diabetes mellitus without complication (HCC)    a. Dx ~ 1996.  [name] Dysrhythmia   . Essential hypertension   . Gastritis    a. 04/2015 hematemesis -> EGD: gastritis, esophagitis, duodenitis.  No active bleeding.  PPI added.  [name] GERD (gastroesophageal reflux disease)   . Left leg DVT (HCC)    a. Dx 05/2015 -> Coumadin.  . MI (myocardial infarction) (HCC)   . Osteomyelitis (HCC)    a. 05/2015 L foot.    HOSPITAL COURSE:   46 year old male with chronic systolic heart failure, cardiomyopathy with EF of 10%, end-stage renal disease on Tuesday, Thursday and Saturday hemodialysis, diabetes, hypertension, COPD, atrial fibrillation, history of cirrhosis and ascites presents to hospital secondary to worsening abdominal pain and distention.  #1 spontaneous bacterial peritonitis-ascites with cirrhosis of the liver, CT of the abdomen showing large volume ascites and cirrhosis and anasarca. -s/p paracentesis and 4L fluid taken out. Send for cultures- pending, but minimal wbcs and no organisms noted. Discussed with ID- discontinue trt with fortaz - outpatient long term prophylaxis with vantin three times/week after dialysis Cannot do cipro due to interaction with amiodarone and given his recent cardiac arrest and cardiomyopathy with  EF 10%, will not risk QT prolongation. - cannot do bactrim due to his allergy to sulfa - appreciate ID input  #2 end-stage renal disease on hemodialysis. -On Tuesday, Thursday and Saturday hemodialysis. Nephrology consulted -Takes midodrine with dialysis - dialysis yesterday however catheter malfunction towards the end- cathflo packed and outpatient follow up. Not fluid overloaded now  #3 hyperthyroidism-on Tapazole  #4 Depression and anxiety-continue home medications.  #5 Anemia of chronic disease- hb stable, monitor. epo with dialysis  DISCHARGE CONDITIONS:   Guarded  CONSULTS OBTAINED:   Treatment Team:  Mick Sell, MD [name], MD Wyline Mood, MD  DRUG ALLERGIES:   Allergies  Allergen Reactions  . Sulfur Other (See Comments)    Pt states that this medication causes renal failure.     DISCHARGE MEDICATIONS:   Allergies as of 03/09/2017      Reactions   Sulfur Other (See Comments)   Pt states that this medication causes renal failure.        Medication List    TAKE these medications   amiodarone 200 MG tablet Commonly known as:  PACERONE Take 1 tablet (200 mg total) by mouth daily.   aspirin 81 MG EC tablet Take 1 tablet (81 mg total) by mouth daily.   bisacodyl 10 MG suppository Commonly known as:  DULCOLAX Place 1 suppository (10 mg total) rectally daily as needed for moderate constipation.   cefpodoxime 200 MG tablet Commonly known as:  VANTIN Take 1 tablet (200 mg total) by mouth every Tuesday, Thursday, and Saturday at 6 PM. Take in  the evening after dialysis- life long Start taking on:  03/10/2017   fluticasone furoate-vilanterol 100-25 MCG/INH Aepb Commonly known as:  BREO ELLIPTA Inhale 1 puff into the lungs daily.   insulin aspart 100 UNIT/ML injection Commonly known as:  novoLOG Inject 4 Units into the skin 3 (three) times daily with meals.   methimazole 10 MG tablet Commonly known as:  TAPAZOLE Take 1 tablet (10 mg  total) by mouth daily.   midodrine 5 MG tablet Commonly known as:  PROAMATINE On Tu, Thus, Sat 30 min before Hemodialysis What changed:  how much to take  how to take this  when to take this  additional instructions   multivitamin Tabs tablet Take 1 tablet by mouth at bedtime.   pantoprazole 40 MG tablet Commonly known as:  PROTONIX Take 1 tablet (40 mg total) by mouth daily.   senna-docusate 8.6-50 MG tablet Commonly known as:  Senokot-S Take 1 tablet by mouth at bedtime.   sertraline 50 MG tablet Commonly known as:  ZOLOFT Take 50 mg by mouth daily.   zolpidem 5 MG tablet Commonly known as:  AMBIEN Take 5 mg by mouth at bedtime as needed for sleep.        DISCHARGE INSTRUCTIONS:   1. PCP follow-up in 1-2 weeks 2. For dialysis tomorrow per schedule 3. GI follow-up in 3 weeks  DIET:   Renal diet  ACTIVITY:   Activity as tolerated  OXYGEN:   Home Oxygen: No.  Oxygen Delivery: room air  DISCHARGE LOCATION:   home   If you experience worsening of your admission symptoms, develop shortness of breath, life threatening emergency, suicidal or homicidal thoughts you must seek medical attention immediately by calling 911 or calling your MD immediately  if symptoms less severe.  You Must read complete instructions/literature along with all the possible adverse reactions/side effects for all the Medicines you take and that have been prescribed to you. Take any new Medicines after you have completely understood and accpet all the possible adverse reactions/side effects.   Please note  You were cared for by a hospitalist during your hospital stay. If you have any questions about your discharge medications or the care you received while you were in the hospital after you are discharged, you can call the unit and asked to speak with the hospitalist on call if the hospitalist that took care of you is not available. Once you are discharged, your primary care  physician will handle any further medical issues. Please note that NO REFILLS for any discharge medications will be authorized once you are discharged, as it is imperative that you return to your primary care physician (or establish a relationship with a primary care physician if you do not have one) for your aftercare needs so that they can reassess your need for medications and monitor your lab values.    On the day of Discharge:  VITAL SIGNS:   Blood pressure 102/65, pulse (!) 109, temperature 98.4 F (36.9 C), temperature source Oral, resp. rate 20, height 6\' 2"  (1.88 m), weight 74 kg (163 lb 2.3 oz), SpO2 93 %.  PHYSICAL EXAMINATION:    GENERAL:  46 y.o.-year-old patient lying in the bed with no acute distress.  EYES: Pupils equal, round, reactive to light and accommodation. Minimal  scleral icterus. Extraocular muscles intact.  HEENT: Head atraumatic, normocephalic. Oropharynx and nasopharynx clear.  NECK:  Supple, no jugular venous distention. No thyroid enlargement, no tenderness.  LUNGS: Normal breath sounds bilaterally, no wheezing,  rales,rhonchi or crepitation. No use of accessory muscles of respiration. Decreased bibasilar breath sounds CARDIOVASCULAR: S1, S2 normal. No murmurs, rubs, or gallops.  ABDOMEN: Soft, nontender, nondistended. hypoactive Bowel sounds present. No organomegaly or mass.  EXTREMITIES: No pedal edema, cyanosis, or clubbing.  NEUROLOGIC: Cranial nerves II through XII are intact. Muscle strength 5/5 in all extremities. Sensation intact. Gait not checked.  PSYCHIATRIC: The patient is alert and oriented x 3.  SKIN: No obvious rash, lesion, or ulcer.   DATA REVIEW:   CBC  Recent Labs Lab 03/08/17 0438  WBC 5.7  HGB 8.7*  HCT 27.4*  PLT 198    Chemistries   Recent Labs Lab 03/07/17 1820  03/09/17 0447  NA 133*  < > 131*  K 4.9  < > 4.4  CL 97*  < > 97*  CO2 23  < > 24  GLUCOSE 175*  < > 97  BUN 41*  < > 40*  CREATININE 4.58*  < > 4.41*    CALCIUM 9.1  < > 8.3*  AST 24  --   --   ALT 16*  --   --   ALKPHOS 57  --   --   BILITOT 1.8*  --   --   < > = values in this interval not displayed.   Microbiology Results  Results for orders placed or performed during the hospital encounter of 03/07/17  Body fluid culture     Status: None (Preliminary result)   Collection Time: 03/08/17 11:38 AM  Result Value Ref Range Status   Specimen Description PERITONEAL  Final   Special Requests NONE  Final   Gram Stain   Final    WBC PRESENT, PREDOMINANTLY PMN NO ORGANISMS SEEN CYTOSPIN SMEAR    Culture   Final    NO GROWTH < 24 HOURS Performed at [facility], [address], [place], [place] [address]    Report Status PENDING  Incomplete  MRSA PCR Screening     Status: Abnormal   Collection Time: 03/08/17  3:56 PM  Result Value Ref Range Status   MRSA by PCR POSITIVE (A) NEGATIVE Final    Comment:        The GeneXpert MRSA Assay (FDA approved for NASAL specimens only), is one component of a comprehensive MRSA colonization surveillance program. It is not intended to diagnose MRSA infection nor to guide or monitor treatment for MRSA infections. CRITICAL RESULT CALLED TO, READ BACK BY AND VERIFIED WITH: ZANETTA HARRIS AT 1729 ON 03/08/2017 JJB     RADIOLOGY:  No results found.   Management plans discussed with the patient, family and they are in agreement.  CODE STATUS:     Code Status Orders        Start     Ordered   03/07/17 2310  Full code  Continuous     03/07/17 2309    Code Status History    Date Active Date Inactive Code Status Order ID Comments User Context   02/06/2017  1:55 PM 02/09/2017  6:54 PM Full Code [id number]  Houston Siren, MD ED   01/25/2017  1:08 PM 01/27/2017  6:10 PM Full Code [id number]  Houston Siren, MD Inpatient   01/14/2017  6:18 AM 01/19/2017  9:31 PM Full Code [id number]  Ihor Austin, MD Inpatient   12/05/2016  8:01 PM 12/08/2016  3:11 PM Full Code [id number]  Altamese Dilling, MD Inpatient   10/28/2016  8:42 PM 10/31/2016  8:35 PM  Full Code [id number]  Houston SirenSainani, Vivek J, MD Inpatient   10/17/2016  8:18 PM 10/27/2016  6:48 PM Full Code [id number]  Auburn BilberryPatel, Shreyang, MD Inpatient   09/24/2016  3:36 PM 09/25/2016  2:15 PM Full Code [id number]  Wyatt HasteHower, David K, MD ED   07/21/2016 11:05 AM 07/22/2016  3:11 PM Full Code [id number]  Alford HighlandWieting, Richard, MD ED   02/28/2016  1:43 AM 03/01/2016  6:06 PM Full Code [id number]  Arnaldo Nataliamond, Michael S, MD Inpatient   12/06/2015  8:39 PM 12/08/2015  2:51 PM Full Code [id number]  Altamese DillingVachhani, Vaibhavkumar, MD Inpatient   11/01/2015  4:00 PM 11/03/2015  2:28 PM Full Code [id number]  Hower, Cletis Athensavid K, MD ED   06/29/2015  2:30 AM 07/01/2015  2:21 PM Full Code [id number]  Arnaldo Nataliamond, Michael S, MD Inpatient   05/21/2015  4:34 PM 05/25/2015  4:34 PM Full Code [id number]  Linus Galasline, Todd, MD Inpatient   05/18/2015  9:17 PM 05/21/2015  4:34 PM Full Code [id number]  Shaune Pollackhen, Qing, MD Inpatient   04/05/2015  7:05 AM 04/08/2015  2:00 PM Full Code [id number]  Arnaldo Nataliamond, Michael S, MD Inpatient      TOTAL TIME TAKING CARE OF THIS PATIENT: 37 minutes.    Britanni Yarde M.D on 03/09/2017 at 1:49 PM  Between 7am to 6pm - Pager - [phone]  After 6pm go to [email or web address] - Social research officer, governmentpassword [email or web address]  [facility] [place] [facility]  Office  [phone]  CC: Primary care physician; Corky DownsMasoud, Javed, MD   Note: This dictation was prepared with Dragon dictation along with smaller phrase technology. Any transcriptional errors that result from this process are unintentional.

## 2017-03-09 NOTE — Progress Notes (Signed)
Patient discharged to home. Pt concerned about shoes lost and clothing. Notified environmental and charge RN  no items found. Pt given director information to follow-up.

## 2017-03-09 NOTE — Care Management (Signed)
Patient admitted with SBP.  Patient lives at home with daughter.  Patient chronic HD patient [name]  [name] HD liaison notified.  PT has evaluated patient and recommends home health PT.  Patient states that he has a RW, BSC, and shower seat in the home. Patient is agreeable to home health services.  [facility]ann Sheen[facility]parks.  Patient in agreement for [facility] services through [facility].  Christie from [facility] Notified of referral.  RNCM signing off.

## 2017-03-11 LAB — BODY FLUID CULTURE: Culture: NO GROWTH

## 2017-03-14 ENCOUNTER — Encounter (INDEPENDENT_AMBULATORY_CARE_PROVIDER_SITE_OTHER): Payer: Self-pay

## 2017-03-20 ENCOUNTER — Other Ambulatory Visit (INDEPENDENT_AMBULATORY_CARE_PROVIDER_SITE_OTHER): Payer: Self-pay | Admitting: Vascular Surgery

## 2017-03-20 ENCOUNTER — Encounter: Payer: Self-pay | Admitting: Intensive Care

## 2017-03-20 ENCOUNTER — Emergency Department
Admission: EM | Admit: 2017-03-20 | Discharge: 2017-03-20 | Disposition: A | Discharge status: Home / Self Care | Payer: BLUE CROSS/BLUE SHIELD | Source: Home / Self Care | Attending: Emergency Medicine | Admitting: Emergency Medicine

## 2017-03-20 ENCOUNTER — Emergency Department: Payer: BLUE CROSS/BLUE SHIELD

## 2017-03-20 DIAGNOSIS — E875 Hyperkalemia: Secondary | ICD-10-CM | POA: Diagnosis not present

## 2017-03-20 DIAGNOSIS — N183 Chronic kidney disease, stage 3 (moderate): Secondary | ICD-10-CM

## 2017-03-20 DIAGNOSIS — R112 Nausea with vomiting, unspecified: Secondary | ICD-10-CM

## 2017-03-20 DIAGNOSIS — N186 End stage renal disease: Secondary | ICD-10-CM

## 2017-03-20 DIAGNOSIS — R1084 Generalized abdominal pain: Secondary | ICD-10-CM

## 2017-03-20 DIAGNOSIS — I509 Heart failure, unspecified: Secondary | ICD-10-CM

## 2017-03-20 DIAGNOSIS — E1122 Type 2 diabetes mellitus with diabetic chronic kidney disease: Secondary | ICD-10-CM

## 2017-03-20 DIAGNOSIS — Z992 Dependence on renal dialysis: Secondary | ICD-10-CM

## 2017-03-20 DIAGNOSIS — R188 Other ascites: Secondary | ICD-10-CM

## 2017-03-20 DIAGNOSIS — I130 Hypertensive heart and chronic kidney disease with heart failure and stage 1 through stage 4 chronic kidney disease, or unspecified chronic kidney disease: Secondary | ICD-10-CM

## 2017-03-20 DIAGNOSIS — G43A0 Cyclical vomiting, not intractable: Secondary | ICD-10-CM | POA: Insufficient documentation

## 2017-03-20 DIAGNOSIS — I252 Old myocardial infarction: Secondary | ICD-10-CM

## 2017-03-20 DIAGNOSIS — R109 Unspecified abdominal pain: Secondary | ICD-10-CM

## 2017-03-20 LAB — COMPREHENSIVE METABOLIC PANEL
ALBUMIN: 2.5 g/dL — AB (ref 3.5–5.0)
ALK PHOS: 64 U/L (ref 38–126)
ALT: 11 U/L — ABNORMAL LOW (ref 17–63)
ANION GAP: 9 (ref 5–15)
AST: 18 U/L (ref 15–41)
BILIRUBIN TOTAL: 1.1 mg/dL (ref 0.3–1.2)
BUN: 62 mg/dL — ABNORMAL HIGH (ref 6–20)
CALCIUM: 8.6 mg/dL — AB (ref 8.9–10.3)
CO2: 27 mmol/L (ref 22–32)
Chloride: 96 mmol/L — ABNORMAL LOW (ref 101–111)
Creatinine, Ser: 4.9 mg/dL — ABNORMAL HIGH (ref 0.61–1.24)
GFR calc non Af Amer: 13 mL/min — ABNORMAL LOW (ref >60)
GFR, EST AFRICAN AMERICAN: 15 mL/min — AB (ref >60)
GLUCOSE: 215 mg/dL — AB (ref 65–99)
POTASSIUM: 5.5 mmol/L — AB (ref 3.5–5.1)
SODIUM: 132 mmol/L — AB (ref 135–145)
TOTAL PROTEIN: 6.8 g/dL (ref 6.5–8.1)

## 2017-03-20 LAB — CBC WITH DIFFERENTIAL/PLATELET
BASOS PCT: 1 %
Basophils Absolute: 0.1 10*3/uL (ref 0–0.1)
EOS ABS: 0.1 10*3/uL (ref 0–0.7)
EOS PCT: 1 %
HCT: 26.8 % — ABNORMAL LOW (ref 40.0–52.0)
HEMOGLOBIN: 8.5 g/dL — AB (ref 13.0–18.0)
LYMPHS ABS: 1.2 10*3/uL (ref 1.0–3.6)
Lymphocytes Relative: 15 %
MCH: 26.9 pg (ref 26.0–34.0)
MCHC: 31.7 g/dL — AB (ref 32.0–36.0)
MCV: 84.8 fL (ref 80.0–100.0)
MONO ABS: 0.8 10*3/uL (ref 0.2–1.0)
MONOS PCT: 10 %
Neutro Abs: 5.8 10*3/uL (ref 1.4–6.5)
Neutrophils Relative %: 73 %
Platelets: 229 10*3/uL (ref 150–440)
RBC: 3.16 MIL/uL — ABNORMAL LOW (ref 4.40–5.90)
RDW: 23 % — AB (ref 11.5–14.5)
WBC: 7.9 10*3/uL (ref 3.8–10.6)

## 2017-03-20 LAB — LIPASE, BLOOD: Lipase: 30 U/L (ref 11–51)

## 2017-03-20 LAB — TROPONIN I: TROPONIN I: 0.05 ng/mL — AB (ref <0.03)

## 2017-03-20 MED ORDER — HYDROMORPHONE HCL 1 MG/ML IJ SOLN
1.0000 mg | Freq: Once | INTRAMUSCULAR | Status: AC
  Administered 2017-03-20: 1 mg via INTRAVENOUS
  Filled 2017-03-20: qty 1

## 2017-03-20 MED ORDER — DIPHENHYDRAMINE HCL 25 MG PO CAPS
25.0000 mg | ORAL_CAPSULE | Freq: Once | ORAL | Status: AC
  Administered 2017-03-20: 25 mg via ORAL

## 2017-03-20 MED ORDER — ONDANSETRON HCL 4 MG/2ML IJ SOLN
4.0000 mg | Freq: Once | INTRAMUSCULAR | Status: AC
  Administered 2017-03-20: 4 mg via INTRAVENOUS
  Filled 2017-03-20: qty 2

## 2017-03-20 MED ORDER — DIPHENHYDRAMINE HCL 25 MG PO CAPS
ORAL_CAPSULE | ORAL | Status: AC
  Filled 2017-03-20: qty 2

## 2017-03-20 NOTE — ED Notes (Signed)
Pt sitting up on stretcher    Denies any complaints at present

## 2017-03-20 NOTE — ED Provider Notes (Signed)
[facility] Emergency Department Provider Note       Time seen: ----------------------------------------- 10:27 AM on 03/20/2017 -----------------------------------------     I have reviewed the triage vital signs and the nursing notes.   HISTORY   Chief Complaint No chief complaint on file.    HPI Juan Lafoy. is a 46 y.o. male who presents to the ED for multiple complaints including abdominal pain, vomiting and general ill feeling. Patient reports pain hit him last night. He states typically when he has abdominal pain he gets a paracentesis which upset him feel better. He states when the fluid builds up or presses on his stomach and causes pain and perhaps vomiting. He denies fevers or chills, he states dialysis is going normally.   Past Medical History:  Diagnosis Date  . Cellulitis and abscess of foot    Left-Dr. Ether Griffins  . CHF (congestive heart failure) (HCC)    EF 20%  . CKD (chronic kidney disease), stage III   . Diabetes mellitus without complication (HCC)    a. Dx ~ 1996.  [name] Dysrhythmia   . Essential hypertension   . Gastritis    a. 04/2015 hematemesis -> EGD: gastritis, esophagitis, duodenitis.  No active bleeding.  PPI added.  [name] GERD (gastroesophageal reflux disease)   . Left leg DVT (HCC)    a. Dx 05/2015 -> Coumadin.  . MI (myocardial infarction) (HCC)   . Osteomyelitis (HCC)    a. 05/2015 L foot.    Patient Active Problem List   Diagnosis Date Noted  . Pleural effusion 01/25/2017  . CHF exacerbation (HCC) 01/14/2017  . Acute on chronic renal failure (HCC) 01/09/2017  . SBP (spontaneous bacterial peritonitis) (HCC) 12/05/2016  . Hyperglycemia 10/28/2016  . Sepsis (HCC) 10/17/2016  . Chest pain 09/24/2016  . Acute-on-chronic kidney injury (HCC) 09/24/2016    Past Surgical History:  Procedure Laterality Date  . CARDIAC CATHETERIZATION Right 10/26/2016   Procedure: Left Heart Cath and Coronary Angiography;  Surgeon:  Laurier Nancy, MD;  Location: [facility] INVASIVE CV LAB;  Service: Cardiovascular;  Laterality: Right;  . DIALYSIS/PERMA CATHETER INSERTION N/A 01/17/2017   Procedure: Dialysis/Perma Catheter Insertion;  Surgeon: Annice Needy, MD;  Location: [facility] INVASIVE CV LAB;  Service: Cardiovascular;  Laterality: N/A;  . DIALYSIS/PERMA CATHETER REMOVAL N/A 12/05/2016   Procedure: Dialysis/Perma Catheter Removal;  Surgeon: Renford Dills, MD;  Location: [facility] INVASIVE CV LAB;  Service: Cardiovascular;  Laterality: N/A;  . ESOPHAGOGASTRODUODENOSCOPY N/A 04/07/2015   Procedure: ESOPHAGOGASTRODUODENOSCOPY (EGD);  Surgeon: Midge Minium, MD;  Location: [facility] ENDOSCOPY;  Service: Endoscopy;  Laterality: N/A;  . ESOPHAGOGASTRODUODENOSCOPY (EGD) WITH PROPOFOL Left 06/30/2015   Procedure: ESOPHAGOGASTRODUODENOSCOPY (EGD) WITH PROPOFOL;  Surgeon: Christena Deem, MD;  Location: [facility] ENDOSCOPY;  Service: Endoscopy;  Laterality: Left;  . FOOT SURGERY    . I&D EXTREMITY Left 04/06/2015   Procedure: IRRIGATION AND DEBRIDEMENT EXTREMITY;  Surgeon: Gwyneth Revels, DPM;  Location: [facility] ORS;  Service: Podiatry;  Laterality: Left;  . IRRIGATION AND DEBRIDEMENT FOOT Left 05/21/2015   Procedure: IRRIGATION AND DEBRIDEMENT FOOT;  Surgeon: Linus Galas, MD;  Location: [facility] ORS;  Service: Podiatry;  Laterality: Left;  . IRRIGATION AND DEBRIDEMENT KNEE Right 10/18/2016   Procedure: IRRIGATION AND DEBRIDEMENT KNEE;  Surgeon: Deeann Saint, MD;  Location: [facility] ORS;  Service: Orthopedics;  Laterality: Right;  . KNEE ARTHROSCOPY  10/18/2016   Procedure: ARTHROSCOPY KNEE;  Surgeon: Deeann Saint, MD;  Location: [facility] ORS;  Service: Orthopedics;;  . PERIPHERAL VASCULAR CATHETERIZATION  N/A 10/27/2016   Procedure: Dialysis/Perma Catheter Insertion;  Surgeon: Annice Needy, MD;  Location: [facility] INVASIVE CV LAB;  Service: Cardiovascular;  Laterality: N/A;    Allergies Sulfur  Social History Social History  Substance Use Topics  . Smoking status: Never  Smoker  . Smokeless tobacco: Never Used  . Alcohol use No    Review of Systems Constitutional: Negative for fever. Cardiovascular: Negative for chest pain. Respiratory:Positive for chronic shortness of breath Gastrointestinal: Positive for abdominal pain, vomiting Genitourinary: Negative for dysuria. Musculoskeletal: Negative for back pain. Skin: Negative for rash. Neurological: Negative for headaches, focal weakness or numbness.  All systems negative/normal/unremarkable except as stated in the HPI  ____________________________________________   PHYSICAL EXAM:  VITAL SIGNS: ED Triage Vitals  Enc Vitals Group     BP      Pulse      Resp      Temp      Temp src      SpO2      Weight      Height      Head Circumference      Peak Flow      Pain Score      Pain Loc      Pain Edu?      Excl. in GC?     Constitutional: Alert and oriented. Chronically ill appearing, mild distress Eyes: Conjunctivae are normal. Normal extraocular movements. ENT   Head: Normocephalic and atraumatic.   Nose: No congestion/rhinnorhea.   Mouth/Throat: Mucous membranes are moist.   Neck: No stridor. Cardiovascular: Normal rate, regular rhythm. No murmurs, rubs, or gallops. Respiratory: Normal respiratory effort without tachypnea nor retractions. Breath sounds are clear and equal bilaterally. No wheezes/rales/rhonchi. Gastrointestinal: Diffuse abdominal tenderness, normal bowel sounds Musculoskeletal: Nontender with normal range of motion in extremities. Bilateral mild edema is noted Neurologic:  Normal speech and language. No gross focal neurologic deficits are appreciated.  Skin:  Skin is warm, dry and intact. No rash noted. Psychiatric: Mood and affect are normal. Speech and behavior are normal.  ____________________________________________  EKG: Interpreted by me. Sinus tachycardia with rate 115 bpm, prolonged PR interval, wide QRS, long QT.  Bundle branch  block.  ____________________________________________  ED COURSE:  Pertinent labs & imaging results that were available during my care of the patient were reviewed by me and considered in my medical decision making (see chart for details). Patient presents for abdominal pain and vomiting, we will assess with labs and imaging as indicated. Clinical Course as of Mar 20 1354  Tue Mar 20, 2017  1232 Patient is currently feeling better. States he is due for dialysis at noon today  [JW]  1353 Patient underwent paracentesis of 1.2 L of ascites successfully  [JW]    Clinical Course User Index [JW] Emily Filbert, MD   Procedures ____________________________________________   LABS (pertinent positives/negatives)  Labs Reviewed  CBC WITH DIFFERENTIAL/PLATELET - Abnormal; Notable for the following:       Result Value   RBC 3.16 (*)    Hemoglobin 8.5 (*)    HCT 26.8 (*)    MCHC 31.7 (*)    RDW 23.0 (*)    All other components within normal limits  COMPREHENSIVE METABOLIC PANEL  LIPASE, BLOOD  TROPONIN I    RADIOLOGY  Ultrasound guided paracentesis was performed by the radiology department  ____________________________________________  FINAL ASSESSMENT AND PLAN  Abdominal pain, vomiting, ascites, end-stage renal disease on dialysis  Plan: Patient's labs and imaging were dictated above.  Patient had presented for abdominal pain and vomiting. He has a history of end-stage renal disease and ascites. He has undergone paracentesis here successfully. Currently his symptoms are improved, he is due for dialysis today and will be sent to the dialysis center for dialysis now. I discussed with the nephrologist who agrees with plan.   Emily FilbertWilliams, Onyx Schirmer E, MD   Note: This note was generated in part or whole with voice recognition software. Voice recognition is usually quite accurate but there are transcription errors that can and very often do occur. I apologize for any typographical  errors that were not detected and corrected.     Emily FilbertWilliams, Dinora Hemm E, MD 03/20/17 1356

## 2017-03-20 NOTE — ED Triage Notes (Signed)
PAtient arrived by EMS for upper abdominal pain. Dialysis patient. Access L chest. Last received dialysis Saturday with no problems. A&O x4

## 2017-03-20 NOTE — ED Notes (Signed)
Date and time results received: 03/20/17 1145 am  Test: trop Critical Value: 0.05  Name of Provider Notified: DrWilliams  Orders Received? Or Actions Taken?: none at present

## 2017-03-20 NOTE — ED Notes (Signed)
Taken to u/s via stretcher

## 2017-03-20 NOTE — ED Notes (Signed)
Received pt from Solectron Corporationmber RN  Iv pain meds given  Side rails up and effects explained

## 2017-03-21 ENCOUNTER — Ambulatory Visit
Admission: RE | Admit: 2017-03-21 | Payer: BLUE CROSS/BLUE SHIELD | Source: Ambulatory Visit | Admitting: Vascular Surgery

## 2017-03-21 ENCOUNTER — Inpatient Hospital Stay
Admission: EM | Admit: 2017-03-21 | Discharge: 2017-03-25 | DRG: 640 | Disposition: A | Discharge status: Home / Self Care | Payer: BLUE CROSS/BLUE SHIELD | Attending: Specialist | Admitting: Specialist

## 2017-03-21 ENCOUNTER — Encounter: Payer: Self-pay | Admitting: Emergency Medicine

## 2017-03-21 DIAGNOSIS — N186 End stage renal disease: Secondary | ICD-10-CM

## 2017-03-21 DIAGNOSIS — F329 Major depressive disorder, single episode, unspecified: Secondary | ICD-10-CM | POA: Diagnosis present

## 2017-03-21 DIAGNOSIS — E876 Hypokalemia: Secondary | ICD-10-CM | POA: Diagnosis present

## 2017-03-21 DIAGNOSIS — R1084 Generalized abdominal pain: Secondary | ICD-10-CM

## 2017-03-21 DIAGNOSIS — I132 Hypertensive heart and chronic kidney disease with heart failure and with stage 5 chronic kidney disease, or end stage renal disease: Secondary | ICD-10-CM | POA: Diagnosis present

## 2017-03-21 DIAGNOSIS — Z79899 Other long term (current) drug therapy: Secondary | ICD-10-CM

## 2017-03-21 DIAGNOSIS — D631 Anemia in chronic kidney disease: Secondary | ICD-10-CM | POA: Diagnosis present

## 2017-03-21 DIAGNOSIS — N2581 Secondary hyperparathyroidism of renal origin: Secondary | ICD-10-CM | POA: Diagnosis present

## 2017-03-21 DIAGNOSIS — R112 Nausea with vomiting, unspecified: Secondary | ICD-10-CM

## 2017-03-21 DIAGNOSIS — Z8674 Personal history of sudden cardiac arrest: Secondary | ICD-10-CM

## 2017-03-21 DIAGNOSIS — R109 Unspecified abdominal pain: Secondary | ICD-10-CM | POA: Diagnosis present

## 2017-03-21 DIAGNOSIS — I252 Old myocardial infarction: Secondary | ICD-10-CM | POA: Diagnosis not present

## 2017-03-21 DIAGNOSIS — J45909 Unspecified asthma, uncomplicated: Secondary | ICD-10-CM | POA: Diagnosis present

## 2017-03-21 DIAGNOSIS — K7460 Unspecified cirrhosis of liver: Secondary | ICD-10-CM | POA: Diagnosis present

## 2017-03-21 DIAGNOSIS — E1122 Type 2 diabetes mellitus with diabetic chronic kidney disease: Secondary | ICD-10-CM | POA: Diagnosis present

## 2017-03-21 DIAGNOSIS — I429 Cardiomyopathy, unspecified: Secondary | ICD-10-CM | POA: Diagnosis present

## 2017-03-21 DIAGNOSIS — I4891 Unspecified atrial fibrillation: Secondary | ICD-10-CM | POA: Diagnosis present

## 2017-03-21 DIAGNOSIS — K219 Gastro-esophageal reflux disease without esophagitis: Secondary | ICD-10-CM | POA: Diagnosis present

## 2017-03-21 DIAGNOSIS — Z992 Dependence on renal dialysis: Secondary | ICD-10-CM

## 2017-03-21 DIAGNOSIS — I959 Hypotension, unspecified: Secondary | ICD-10-CM | POA: Diagnosis present

## 2017-03-21 DIAGNOSIS — K652 Spontaneous bacterial peritonitis: Secondary | ICD-10-CM | POA: Diagnosis present

## 2017-03-21 DIAGNOSIS — Z882 Allergy status to sulfonamides status: Secondary | ICD-10-CM

## 2017-03-21 DIAGNOSIS — E1142 Type 2 diabetes mellitus with diabetic polyneuropathy: Secondary | ICD-10-CM | POA: Diagnosis present

## 2017-03-21 DIAGNOSIS — F419 Anxiety disorder, unspecified: Secondary | ICD-10-CM | POA: Diagnosis present

## 2017-03-21 DIAGNOSIS — N185 Chronic kidney disease, stage 5: Secondary | ICD-10-CM

## 2017-03-21 DIAGNOSIS — E875 Hyperkalemia: Principal | ICD-10-CM | POA: Diagnosis present

## 2017-03-21 DIAGNOSIS — R188 Other ascites: Secondary | ICD-10-CM | POA: Diagnosis present

## 2017-03-21 DIAGNOSIS — Z7982 Long term (current) use of aspirin: Secondary | ICD-10-CM

## 2017-03-21 DIAGNOSIS — Z86718 Personal history of other venous thrombosis and embolism: Secondary | ICD-10-CM | POA: Diagnosis not present

## 2017-03-21 DIAGNOSIS — Z794 Long term (current) use of insulin: Secondary | ICD-10-CM

## 2017-03-21 DIAGNOSIS — I5022 Chronic systolic (congestive) heart failure: Secondary | ICD-10-CM | POA: Diagnosis present

## 2017-03-21 DIAGNOSIS — Z7951 Long term (current) use of inhaled steroids: Secondary | ICD-10-CM

## 2017-03-21 LAB — COMPREHENSIVE METABOLIC PANEL
ALT: 16 U/L — ABNORMAL LOW (ref 17–63)
ANION GAP: 12 (ref 5–15)
AST: 42 U/L — ABNORMAL HIGH (ref 15–41)
Albumin: 3 g/dL — ABNORMAL LOW (ref 3.5–5.0)
Alkaline Phosphatase: 72 U/L (ref 38–126)
BUN: 49 mg/dL — ABNORMAL HIGH (ref 6–20)
CHLORIDE: 93 mmol/L — AB (ref 101–111)
CO2: 27 mmol/L (ref 22–32)
Calcium: 9 mg/dL (ref 8.9–10.3)
Creatinine, Ser: 4.36 mg/dL — ABNORMAL HIGH (ref 0.61–1.24)
GFR, EST AFRICAN AMERICAN: 17 mL/min — AB (ref >60)
GFR, EST NON AFRICAN AMERICAN: 15 mL/min — AB (ref >60)
Glucose, Bld: 190 mg/dL — ABNORMAL HIGH (ref 65–99)
Potassium: 6 mmol/L — ABNORMAL HIGH (ref 3.5–5.1)
Sodium: 132 mmol/L — ABNORMAL LOW (ref 135–145)
Total Bilirubin: 1.8 mg/dL — ABNORMAL HIGH (ref 0.3–1.2)
Total Protein: 8.1 g/dL (ref 6.5–8.1)

## 2017-03-21 LAB — GLUCOSE, CAPILLARY
GLUCOSE-CAPILLARY: 140 mg/dL — AB (ref 65–99)
Glucose-Capillary: 63 mg/dL — ABNORMAL LOW (ref 65–99)
Glucose-Capillary: 72 mg/dL (ref 65–99)

## 2017-03-21 LAB — CBC
HEMATOCRIT: 28.9 % — AB (ref 40.0–52.0)
HEMOGLOBIN: 8.9 g/dL — AB (ref 13.0–18.0)
MCH: 26.6 pg (ref 26.0–34.0)
MCHC: 30.8 g/dL — ABNORMAL LOW (ref 32.0–36.0)
MCV: 86.3 fL (ref 80.0–100.0)
PLATELETS: 235 10*3/uL (ref 150–440)
RBC: 3.35 MIL/uL — AB (ref 4.40–5.90)
RDW: 22.2 % — ABNORMAL HIGH (ref 11.5–14.5)
WBC: 7.6 10*3/uL (ref 3.8–10.6)

## 2017-03-21 LAB — LIPASE, BLOOD: LIPASE: 25 U/L (ref 11–51)

## 2017-03-21 SURGERY — DIALYSIS/PERMA CATHETER INSERTION
Anesthesia: Moderate Sedation

## 2017-03-21 MED ORDER — POLYETHYLENE GLYCOL 3350 17 G PO PACK
17.0000 g | PACK | Freq: Every day | ORAL | Status: DC
  Administered 2017-03-21 – 2017-03-22 (×2): 17 g via ORAL
  Filled 2017-03-21 (×3): qty 1

## 2017-03-21 MED ORDER — HEPARIN SODIUM (PORCINE) 5000 UNIT/ML IJ SOLN
5000.0000 [IU] | Freq: Three times a day (TID) | INTRAMUSCULAR | Status: DC
  Administered 2017-03-21 – 2017-03-25 (×10): 5000 [IU] via SUBCUTANEOUS
  Filled 2017-03-21 (×10): qty 1

## 2017-03-21 MED ORDER — INSULIN ASPART 100 UNIT/ML ~~LOC~~ SOLN: 0.0000 [IU] | Freq: Every day | SUBCUTANEOUS | Status: DC

## 2017-03-21 MED ORDER — ONDANSETRON HCL 4 MG/2ML IJ SOLN
4.0000 mg | Freq: Once | INTRAMUSCULAR | Status: AC
Start: 2017-03-21 — End: 2017-03-21
  Administered 2017-03-21: 4 mg via INTRAVENOUS
  Filled 2017-03-21: qty 2

## 2017-03-21 MED ORDER — SERTRALINE HCL 50 MG PO TABS
50.0000 mg | ORAL_TABLET | Freq: Every day | ORAL | Status: DC
  Administered 2017-03-22 – 2017-03-25 (×4): 50 mg via ORAL
  Filled 2017-03-21 (×4): qty 1

## 2017-03-21 MED ORDER — PANTOPRAZOLE SODIUM 40 MG PO TBEC
40.0000 mg | DELAYED_RELEASE_TABLET | Freq: Every day | ORAL | Status: DC
  Administered 2017-03-22 – 2017-03-25 (×4): 40 mg via ORAL
  Filled 2017-03-21 (×4): qty 1

## 2017-03-21 MED ORDER — CEFPODOXIME PROXETIL 200 MG PO TABS
200.0000 mg | ORAL_TABLET | ORAL | Status: DC
  Administered 2017-03-22 – 2017-03-24 (×2): 200 mg via ORAL
  Filled 2017-03-21 (×2): qty 1

## 2017-03-21 MED ORDER — ONDANSETRON HCL 4 MG PO TABS
4.0000 mg | ORAL_TABLET | Freq: Four times a day (QID) | ORAL | Status: DC | PRN
Start: 2017-03-21 — End: 2017-03-25
  Administered 2017-03-24: 4 mg via ORAL
  Filled 2017-03-21: qty 1

## 2017-03-21 MED ORDER — ZOLPIDEM TARTRATE 5 MG PO TABS: 5.0000 mg | ORAL_TABLET | Freq: Every evening | ORAL | Status: DC | PRN

## 2017-03-21 MED ORDER — ASPIRIN EC 81 MG PO TBEC
81.0000 mg | DELAYED_RELEASE_TABLET | Freq: Every day | ORAL | Status: DC
  Administered 2017-03-22 – 2017-03-25 (×4): 81 mg via ORAL
  Filled 2017-03-21 (×4): qty 1

## 2017-03-21 MED ORDER — CEFAZOLIN SODIUM-DEXTROSE 1-4 GM/50ML-% IV SOLN: 1.0000 g | Freq: Once | INTRAVENOUS | Status: DC

## 2017-03-21 MED ORDER — MORPHINE SULFATE (PF) 2 MG/ML IV SOLN
2.0000 mg | INTRAVENOUS | Status: DC | PRN
  Administered 2017-03-21 – 2017-03-22 (×5): 2 mg via INTRAVENOUS
  Filled 2017-03-21 (×5): qty 1

## 2017-03-21 MED ORDER — HYDROCODONE-ACETAMINOPHEN 5-325 MG PO TABS
1.0000 | ORAL_TABLET | ORAL | Status: DC | PRN
  Administered 2017-03-22 – 2017-03-24 (×5): 2 via ORAL
  Filled 2017-03-21 (×5): qty 2

## 2017-03-21 MED ORDER — INSULIN ASPART 100 UNIT/ML ~~LOC~~ SOLN
0.0000 [IU] | Freq: Three times a day (TID) | SUBCUTANEOUS | Status: DC
  Administered 2017-03-21 – 2017-03-23 (×3): 1 [IU] via SUBCUTANEOUS
  Administered 2017-03-24: 2 [IU] via SUBCUTANEOUS
  Filled 2017-03-21 (×4): qty 1

## 2017-03-21 MED ORDER — INSULIN ASPART 100 UNIT/ML ~~LOC~~ SOLN
4.0000 [IU] | Freq: Three times a day (TID) | SUBCUTANEOUS | Status: DC
  Administered 2017-03-21: 4 [IU] via SUBCUTANEOUS
  Filled 2017-03-21: qty 1

## 2017-03-21 MED ORDER — AMIODARONE HCL 200 MG PO TABS
200.0000 mg | ORAL_TABLET | Freq: Every day | ORAL | Status: DC
  Administered 2017-03-22 – 2017-03-25 (×4): 200 mg via ORAL
  Filled 2017-03-21 (×4): qty 1

## 2017-03-21 MED ORDER — MIDODRINE HCL 5 MG PO TABS
5.0000 mg | ORAL_TABLET | ORAL | Status: DC
  Administered 2017-03-22: 5 mg via ORAL
  Filled 2017-03-21: qty 1

## 2017-03-21 MED ORDER — DOCUSATE SODIUM 100 MG PO CAPS
100.0000 mg | ORAL_CAPSULE | Freq: Two times a day (BID) | ORAL | Status: DC
  Administered 2017-03-21 – 2017-03-25 (×7): 100 mg via ORAL
  Filled 2017-03-21 (×8): qty 1

## 2017-03-21 MED ORDER — METHIMAZOLE 10 MG PO TABS
10.0000 mg | ORAL_TABLET | Freq: Every day | ORAL | Status: DC
  Administered 2017-03-22 – 2017-03-25 (×4): 10 mg via ORAL
  Filled 2017-03-21 (×4): qty 1

## 2017-03-21 MED ORDER — HYDROMORPHONE HCL 1 MG/ML IJ SOLN
1.0000 mg | Freq: Once | INTRAMUSCULAR | Status: AC
  Administered 2017-03-21: 1 mg via INTRAVENOUS
  Filled 2017-03-21: qty 1

## 2017-03-21 MED ORDER — ONDANSETRON HCL 4 MG/2ML IJ SOLN
4.0000 mg | Freq: Four times a day (QID) | INTRAMUSCULAR | Status: DC | PRN
  Administered 2017-03-22 – 2017-03-24 (×2): 4 mg via INTRAVENOUS
  Filled 2017-03-21 (×3): qty 2

## 2017-03-21 MED ORDER — ACETAMINOPHEN 325 MG PO TABS
650.0000 mg | ORAL_TABLET | Freq: Four times a day (QID) | ORAL | Status: DC | PRN
  Administered 2017-03-22 – 2017-03-23 (×2): 650 mg via ORAL
  Filled 2017-03-21 (×2): qty 2

## 2017-03-21 MED ORDER — RENA-VITE PO TABS
1.0000 | ORAL_TABLET | Freq: Every day | ORAL | Status: DC
  Administered 2017-03-21 – 2017-03-24 (×4): 1 via ORAL
  Filled 2017-03-21 (×4): qty 1

## 2017-03-21 MED ORDER — ACETAMINOPHEN 650 MG RE SUPP: 650.0000 mg | Freq: Four times a day (QID) | RECTAL | Status: DC | PRN

## 2017-03-21 MED ORDER — FLUTICASONE FUROATE-VILANTEROL 100-25 MCG/INH IN AEPB
1.0000 | INHALATION_SPRAY | Freq: Every day | RESPIRATORY_TRACT | Status: DC
  Administered 2017-03-22 – 2017-03-25 (×4): 1 via RESPIRATORY_TRACT
  Filled 2017-03-21: qty 28

## 2017-03-21 NOTE — Progress Notes (Signed)
  End of hd 

## 2017-03-21 NOTE — Progress Notes (Signed)
Pre hd info 

## 2017-03-21 NOTE — H&P (Signed)
[employer] -  at [facility]   PATIENT NAME: Juan Roberson    MR#:  [id number]  DATE OF BIRTH:  1971-05-07  DATE OF ADMISSION:  03/21/2017  PRIMARY CARE PHYSICIAN: Corky Downs, MD   REQUESTING/REFERRING PHYSICIAN: Dr. Daryel November  CHIEF COMPLAINT:   Chief Complaint  Patient presents with  . Abdominal Pain    HISTORY OF PRESENT ILLNESS:  Juan Roberson  is a 46 y.o. male with a known history of  End-stage renal disease on Tuesday, Thursday and Saturday hemodialysis, systolic CHF chronic with EF of 20%, diabetes mellitus, hypertension, recurrent ascites presents to hospital secondary to worsening abdominal pain and intractable nausea vomiting. Patient has had prior admissions secondary to abdominal pain and has a CT of the abdomen once a month for the past 3 months. He gets admitted for possible SBP, ascites tap will be done he feels better and gets discharge. Last admission was about 2 weeks ago at the time his ascites fluid cultures were negative. After discussing with ID he was placed on cefpodoxime 3 times a week after dialysis and was discharged. His cultures were negative. He came to the emergency room complaining of abdominal pain and nausea vomiting yesterday, ultrasound-guided tap was performed and only 1 L of clear fluid was taken out. His belly still felt distended subjectively. His pain got worse much worse today associated with intractable nausea vomiting and so he presented again. Also he did not finish his dialysis yesterday afternoon. His potassium is elevated at 6.  PAST MEDICAL HISTORY:   Past Medical History:  Diagnosis Date  . Cellulitis and abscess of foot    Left-Dr. Ether Griffins  . CHF (congestive heart failure) (HCC)    EF 20%  . CKD (chronic kidney disease), stage III   . Diabetes mellitus without complication (HCC)    a. Dx ~ 1996.  [name] Dysrhythmia   . Essential hypertension   . Gastritis    a. 04/2015 hematemesis -> EGD: gastritis,  esophagitis, duodenitis.  No active bleeding.  PPI added.  [name] GERD (gastroesophageal reflux disease)   . Left leg DVT (HCC)    a. Dx 05/2015 -> Coumadin.  . MI (myocardial infarction) (HCC)   . Osteomyelitis (HCC)    a. 05/2015 L foot.    PAST SURGICAL HISTORY:   Past Surgical History:  Procedure Laterality Date  . CARDIAC CATHETERIZATION Right 10/26/2016   Procedure: Left Heart Cath and Coronary Angiography;  Surgeon: Laurier Nancy, MD;  Location: [facility] INVASIVE CV LAB;  Service: Cardiovascular;  Laterality: Right;  . DIALYSIS/PERMA CATHETER INSERTION N/A 01/17/2017   Procedure: Dialysis/Perma Catheter Insertion;  Surgeon: Annice Needy, MD;  Location: [facility] INVASIVE CV LAB;  Service: Cardiovascular;  Laterality: N/A;  . DIALYSIS/PERMA CATHETER REMOVAL N/A 12/05/2016   Procedure: Dialysis/Perma Catheter Removal;  Surgeon: Renford Dills, MD;  Location: [facility] INVASIVE CV LAB;  Service: Cardiovascular;  Laterality: N/A;  . ESOPHAGOGASTRODUODENOSCOPY N/A 04/07/2015   Procedure: ESOPHAGOGASTRODUODENOSCOPY (EGD);  Surgeon: Midge Minium, MD;  Location: [facility] ENDOSCOPY;  Service: Endoscopy;  Laterality: N/A;  . ESOPHAGOGASTRODUODENOSCOPY (EGD) WITH PROPOFOL Left 06/30/2015   Procedure: ESOPHAGOGASTRODUODENOSCOPY (EGD) WITH PROPOFOL;  Surgeon: Christena Deem, MD;  Location: [facility] ENDOSCOPY;  Service: Endoscopy;  Laterality: Left;  . FOOT SURGERY    . I&D EXTREMITY Left 04/06/2015   Procedure: IRRIGATION AND DEBRIDEMENT EXTREMITY;  Surgeon: Gwyneth Revels, DPM;  Location: [facility] ORS;  Service: Podiatry;  Laterality: Left;  . IRRIGATION AND DEBRIDEMENT FOOT Left 05/21/2015  Procedure: IRRIGATION AND DEBRIDEMENT FOOT;  Surgeon: Linus Galasodd Cline, MD;  Location: [facility] ORS;  Service: Podiatry;  Laterality: Left;  . IRRIGATION AND DEBRIDEMENT KNEE Right 10/18/2016   Procedure: IRRIGATION AND DEBRIDEMENT KNEE;  Surgeon: Deeann SaintHoward Miller, MD;  Location: [facility] ORS;  Service: Orthopedics;  Laterality: Right;  . KNEE ARTHROSCOPY   10/18/2016   Procedure: ARTHROSCOPY KNEE;  Surgeon: Deeann SaintHoward Miller, MD;  Location: [facility] ORS;  Service: Orthopedics;;  . PERIPHERAL VASCULAR CATHETERIZATION N/A 10/27/2016   Procedure: Dialysis/Perma Catheter Insertion;  Surgeon: Annice NeedyJason S Dew, MD;  Location: [facility] INVASIVE CV LAB;  Service: Cardiovascular;  Laterality: N/A;    SOCIAL HISTORY:   Social History  Substance Use Topics  . Smoking status: Never Smoker  . Smokeless tobacco: Never Used  . Alcohol use No    FAMILY HISTORY:   Family History  Problem Relation Age of Onset  . Coronary artery disease Father   . Pancreatic cancer Mother   . Breast cancer Sister   . Lung cancer Brother   . Cervical cancer Sister     DRUG ALLERGIES:   Allergies  Allergen Reactions  . Sulfur Other (See Comments)    Pt states that this medication causes renal failure.      REVIEW OF SYSTEMS:   Review of Systems  Constitutional: Positive for malaise/fatigue. Negative for chills, fever and weight loss.  HENT: Negative for ear discharge, hearing loss, nosebleeds and tinnitus.   Eyes: Negative for blurred vision, double vision and photophobia.  Respiratory: Negative for cough, hemoptysis, shortness of breath and wheezing.   Cardiovascular: Negative for chest pain, palpitations, orthopnea and leg swelling.  Gastrointestinal: Positive for abdominal pain, nausea and vomiting. Negative for constipation, diarrhea, heartburn and melena.  Genitourinary: Negative for dysuria, frequency, hematuria and urgency.  Musculoskeletal: Negative for myalgias and neck pain.  Skin: Negative for rash.  Neurological: Negative for dizziness, tingling, sensory change, speech change, focal weakness and headaches.  Endo/Heme/Allergies: Does not bruise/bleed easily.  Psychiatric/Behavioral: Negative for depression.    MEDICATIONS AT HOME:   Prior to Admission medications   Medication Sig Start Date End Date Taking? Authorizing Provider  amiodarone (PACERONE) 200 MG  tablet Take 1 tablet (200 mg total) by mouth daily. 09/25/16  Yes Shaune Pollackhen, Qing, MD  aspirin EC 81 MG EC tablet Take 1 tablet (81 mg total) by mouth daily. 07/23/16  Yes Alford HighlandWieting, Richard, MD  cefpodoxime Varney Baas(VANTIN) 200 MG tablet Take 1 tablet (200 mg total) by mouth every Tuesday, Thursday, and Saturday at 6 PM. Take in the evening after dialysis- life long 03/10/17  Yes Enid BaasKalisetti, Jennalee Greaves, MD  fluticasone furoate-vilanterol (BREO ELLIPTA) 100-25 MCG/INH AEPB Inhale 1 puff into the lungs daily.   Yes [provider]  insulin aspart (NOVOLOG) 100 UNIT/ML injection Inject 4 Units into the skin 3 (three) times daily with meals. 10/31/16  Yes Enid BaasKalisetti, Yulitza Shorts, MD  methimazole (TAPAZOLE) 10 MG tablet Take 1 tablet (10 mg total) by mouth daily. 07/22/16  Yes Alford HighlandWieting, Richard, MD  midodrine (PROAMATINE) 5 MG tablet On Tu, Thus, Sat 30 min before Hemodialysis Patient taking differently: Take 5 mg by mouth Every Tuesday,Thursday,and Saturday with dialysis. 30 minutes before hemodialysis 02/09/17  Yes Sainani, Rolly PancakeVivek J, MD  multivitamin (RENA-VIT) TABS tablet Take 1 tablet by mouth at bedtime. 01/19/17  Yes Enedina FinnerPatel, Sona, MD  pantoprazole (PROTONIX) 40 MG tablet Take 1 tablet (40 mg total) by mouth daily. 09/25/16  Yes Shaune Pollackhen, Qing, MD  sertraline (ZOLOFT) 50 MG tablet Take 50 mg by mouth  daily.   Yes [provider]  zolpidem (AMBIEN) 5 MG tablet Take 5 mg by mouth at bedtime as needed for sleep.  11/14/16  Yes [provider]      VITAL SIGNS:  Blood pressure 122/83, pulse (!) 113, temperature 97.9 F (36.6 C), temperature source Oral, resp. rate 18, height 6\' 2"  (1.88 m), weight 73.9 kg (163 lb), SpO2 94 %.  PHYSICAL EXAMINATION:   Physical Exam  GENERAL:  46 y.o.-year-old patient lying in the bed with no acute distress.  EYES: Pupils equal, round, reactive to light and accommodation. No scleral icterus. Extraocular muscles intact.  HEENT: Head atraumatic, normocephalic. Oropharynx and  nasopharynx clear.  NECK:  Supple, no jugular venous distention. No thyroid enlargement, no tenderness.  LUNGS: Normal breath sounds bilaterally, no wheezing, rales,rhonchi or crepitation. No use of accessory muscles of respiration.  CARDIOVASCULAR: S1, S2 normal. No murmurs, rubs, or gallops.  ABDOMEN: Soft, distended, Diffusely tender in mid abdomen. Bowel sounds present. No organomegaly or mass.  EXTREMITIES: No cyanosis, or clubbing. 1+ pedal edema noted. NEUROLOGIC: Cranial nerves II through XII are intact. Muscle strength 5/5 in all extremities. Sensation intact. Gait not checked.  PSYCHIATRIC: The patient is alert and oriented x 3.  SKIN: No obvious rash, lesion, or ulcer.   LABORATORY PANEL:   CBC  Recent Labs Lab 03/21/17 1420  WBC 7.6  HGB 8.9*  HCT 28.9*  PLT 235   ------------------------------------------------------------------------------------------------------------------  Chemistries   Recent Labs Lab 03/21/17 1337  NA 132*  K 6.0*  CL 93*  CO2 27  GLUCOSE 190*  BUN 49*  CREATININE 4.36*  CALCIUM 9.0  AST 42*  ALT 16*  ALKPHOS 72  BILITOT 1.8*   ------------------------------------------------------------------------------------------------------------------  Cardiac Enzymes  Recent Labs Lab 03/20/17 1105  TROPONINI 0.05*   ------------------------------------------------------------------------------------------------------------------  RADIOLOGY:  US Paracentesis  Result Date: 03/20/2017 INDICATION: Recurrent ascites, abdominal distension EXAM: ULTRASOUND GUIDED PARACENTESIS MEDICATIONS: 1% lidocaine locally COMPLICATIONS: None immediate. PROCEDURE: An ultrasound guided paracentesis was thoroughly discussed with the patient and questions answered. The benefits, risks, alternatives and complications were also discussed. The patient understands and wishes to proceed with the procedure. Written consent was obtained. Ultrasound was performed to  localize and mark an adequate pocket of fluid in the left lower quadrant of the abdomen. The area was then prepped and draped in the normal sterile fashion. 1% Lidocaine was used for local anesthesia. Under ultrasound guidance a 19 gauge Yueh catheter was introduced. Paracentesis was performed. The catheter was removed and a dressing applied. FINDINGS: A total of approximately 1.2 L of clear amber peritoneal fluid was removed. A fluid sample was not sent for laboratory analysis. IMPRESSION: Successful ultrasound guided paracentesis yielding 1.2 L of ascites. Electronically Signed   By: Judie Petit.  Shick M.D.   On: 03/20/2017 14:18    EKG:   Orders placed or performed during the hospital encounter of 03/20/17  . ED EKG  . ED EKG    IMPRESSION AND PLAN:   Juan Roberson  is a 46 y.o. male with a known history of  End-stage renal disease on Tuesday, Thursday and Saturday hemodialysis, systolic CHF chronic with EF of 20%, diabetes mellitus, hypertension, recurrent ascites presents to hospital secondary to worsening abdominal pain and intractable nausea vomiting.  #1 abdominal pain with nausea and vomiting-likely recurrent ascites, order for ultrasound guided tap Again -We'll send the fluid for cultures. 1 L drawn yesterday and not sent for tests. -Continue prophylaxis for SBP until confirmed. -Continue pain medications  and anti-emetics as needed.  #2 hyperkalemia-didn't finish his dialysis well yesterday. Abdomen nephrology consulted for the same.  #3 end-stage renal disease on hemodialysis-due to his hyperkalemia, he will get dialysis done today. -nephrology consulted - on Tue-thur-sat regular dialysis schedule  #4 diabetes mellitus-continue pre-meal NovoLog and also on sliding scale insulin.  #5-asthma-stable, continue inhalers.  #6 DVT prophylaxis-on subcutaneous heparin   All the records are reviewed and case discussed with ED provider. Management plans discussed with the patient, family and  they are in agreement.  CODE STATUS: Full Code  TOTAL TIME TAKING CARE OF THIS PATIENT: 50 minutes.    Electra Paladino M.D on 03/21/2017 at 3:40 PM  Between 7am to 6pm - Pager - [phone]  After 6pm go to [email or web address] - Social research officer, government  [facility]  Office  [phone]  CC: Primary care physician; Corky Downs, MD

## 2017-03-21 NOTE — Progress Notes (Signed)
Pre hd assessment  

## 2017-03-21 NOTE — Progress Notes (Signed)
1920: to Dialysis

## 2017-03-21 NOTE — Progress Notes (Signed)
[facility]  ROUNDING NOTE   Subjective:  Patient returns for abdominal pain which is located in the peri-umblical area and radiates to mid left abdomen He did have partial dialysis treatment yesterday as outpatient. Was in the ER yesterda. Underwent abdominal paracentesis. 1.2 L was removed   Objective:  Vital signs in last 24 hours:  Temp:  [97.9 F (36.6 C)] 97.9 F (36.6 C) (06/20 1257) Pulse Rate:  [108-113] 109 (06/20 1530) Resp:  [18-28] 19 (06/20 1530) BP: (112-136)/(83-92) 136/92 (06/20 1530) SpO2:  [93 %-100 %] 100 % (06/20 1530) Weight:  [73.9 kg (163 lb)] 73.9 kg (163 lb) (06/20 1257)  Weight change:  Filed Weights   03/21/17 1257  Weight: 73.9 kg (163 lb)    Intake/Output: No intake/output data recorded.   Intake/Output this shift:  No intake/output data recorded.  Physical Exam: General: No acute distress  Head: Normocephalic, atraumatic. Moist oral mucosal membranes  Eyes: Anicteric  Neck: Supple,    Lungs:  Clear to auscultation, normal effort  Heart: S1S2 no rubs  Abdomen:  periumblical tenderness radiating to left abdomen  Extremities: +  peripheral edema.  Neurologic: Awake, alert, following commands  Skin: No lesions  Access: L IJ permcath    Basic Metabolic Panel:  Recent Labs Lab 03/20/17 1105 03/21/17 1337  NA 132* 132*  K 5.5* 6.0*  CL 96* 93*  CO2 27 27  GLUCOSE 215* 190*  BUN 62* 49*  CREATININE 4.90* 4.36*  CALCIUM 8.6* 9.0    Liver Function Tests:  Recent Labs Lab 03/20/17 1105 03/21/17 1337  AST 18 42*  ALT 11* 16*  ALKPHOS 64 72  BILITOT 1.1 1.8*  PROT 6.8 8.1  ALBUMIN 2.5* 3.0*    Recent Labs Lab 03/20/17 1105 03/21/17 1337  LIPASE 30 25   No results for input(s): AMMONIA in the last 168 hours.  CBC:  Recent Labs Lab 03/20/17 1105 03/21/17 1420  WBC 7.9 7.6  NEUTROABS 5.8  --   HGB 8.5* 8.9*  HCT 26.8* 28.9*  MCV 84.8 86.3  PLT 229 235    Cardiac Enzymes:  Recent Labs Lab  03/20/17 1105  TROPONINI 0.05*    BNP: Invalid input(s): POCBNP  CBG: No results for input(s): GLUCAP in the last 168 hours.  Microbiology: Results for orders placed or performed during the hospital encounter of 03/07/17  Body fluid culture     Status: None   Collection Time: 03/08/17 11:38 AM  Result Value Ref Range Status   Specimen Description PERITONEAL  Final   Special Requests NONE  Final   Gram Stain   Final    WBC PRESENT, PREDOMINANTLY PMN NO ORGANISMS SEEN CYTOSPIN SMEAR    Culture   Final    NO GROWTH 3 DAYS Performed at [facility], [address] [place], [place] [address]    Report Status 03/11/2017 FINAL  Final  MRSA PCR Screening     Status: Abnormal   Collection Time: 03/08/17  3:56 PM  Result Value Ref Range Status   MRSA by PCR POSITIVE (A) NEGATIVE Final    Comment:        The GeneXpert MRSA Assay (FDA approved for NASAL specimens only), is one component of a comprehensive MRSA colonization surveillance program. It is not intended to diagnose MRSA infection nor to guide or monitor treatment for MRSA infections. CRITICAL RESULT CALLED TO, READ BACK BY AND VERIFIED WITH: ZANETTA HARRIS AT 1729 ON 03/08/2017 JJB     Coagulation  Studies: No results for input(s): LABPROT, INR in the last 72 hours.  Urinalysis: No results for input(s): COLORURINE, LABSPEC, PHURINE, GLUCOSEU, HGBUR, BILIRUBINUR, KETONESUR, PROTEINUR, UROBILINOGEN, NITRITE, LEUKOCYTESUR in the last 72 hours.  Invalid input(s): APPERANCEUR    Imaging: [place] Paracentesis  Result Date: 03/20/2017 INDICATION: Recurrent ascites, abdominal distension EXAM: ULTRASOUND GUIDED PARACENTESIS MEDICATIONS: 1% lidocaine locally COMPLICATIONS: None immediate. PROCEDURE: An ultrasound guided paracentesis was thoroughly discussed with the patient and questions answered. The benefits, risks, alternatives and complications were also discussed. The patient understands and wishes to proceed with  the procedure. Written consent was obtained. Ultrasound was performed to localize and mark an adequate pocket of fluid in the left lower quadrant of the abdomen. The area was then prepped and draped in the normal sterile fashion. 1% Lidocaine was used for local anesthesia. Under ultrasound guidance a 19 gauge Yueh catheter was introduced. Paracentesis was performed. The catheter was removed and a dressing applied. FINDINGS: A total of approximately 1.2 L of clear amber peritoneal fluid was removed. A fluid sample was not sent for laboratory analysis. IMPRESSION: Successful ultrasound guided paracentesis yielding 1.2 L of ascites. Electronically Signed   By: Judie PetitM.  Shick M.D.   On: 03/20/2017 14:18     Medications:       Assessment/ Plan:  46 y.o. male with hypertension, diabetes mellitus type 2, ESRD on HD, peripheral neuropathy, atrial fibrillation, multiple episodes of SBP, depression, hyperthyroidism, cardiac arrest during planned eye surgery secondary to anesthesia, severe CHF - EF 30 % in April 2018  [name]   1. ESRD on HD TTS: With Hyperkalemia Urgent HD requested Will dialyze with 1 K bath  2. LE edema - Fluid removal with HD as tolerated 1200 cc fluid restricted diet  3. Anemia chronic kidney disease.  Continue Epogen  4. Secondary hyperparathyroidism.  Monitor phos level  5. Spontaneous bacterial peritonitis. The patient has had a number recurrent episodes of peritonitis. Patient was taking prophylactic antibiotics as an outpatient.     LOS: 0 Makailah Slavick 6/20/20184:06 PM

## 2017-03-21 NOTE — Plan of Care (Signed)
Problem: Safety: Goal: Ability to remain free from injury will improve Outcome: Progressing According to the floor protocol, pt scores to be a high fall risk due to multiple falls in the last 6 months. Pt refuses the bed alarm. Pt expresses to RN that he will call nursing staff when he needs to get out of the bed for assistance.

## 2017-03-21 NOTE — Progress Notes (Signed)
Post hd assessment 

## 2017-03-21 NOTE — Progress Notes (Signed)
Discussed with radiology technician, patient had paracentesis yesterday and they were able to drain 1.2 L and they felt that there was no ascites left at the time. So we'll discontinue the ultrasound guided paracentesis for now.

## 2017-03-21 NOTE — ED Triage Notes (Signed)
Pt states seen here yesterday for LLQ abdominal pain that radiates to his back and NVD. Pt reports pain is not any better. Pt states the pain is bringing him to tears.

## 2017-03-21 NOTE — Progress Notes (Signed)
Post hd vitals 

## 2017-03-21 NOTE — ED Provider Notes (Signed)
[facility] Emergency Department Provider Note       Time seen: ----------------------------------------- 1:17 PM on 03/21/2017 -----------------------------------------     I have reviewed the triage vital signs and the nursing notes.   HISTORY   Chief Complaint Abdominal Pain    HPI Juan Silvestro. is a 46 y.o. male who presents to the ED for abdominal pain that radiates into his back with nausea, vomiting and diarrhea. Patient reports the pain is not any better and that the pain is bringing him to tears. Patient was seen by me yesterday had a paracentesis and dialysis arranged. Patient states nothing is helping his symptoms, states the pain has been so severe he's been crying. He has also been vomiting. After leaving here he had half of his dialysis but not the full dialysis treatment.   Past Medical History:  Diagnosis Date  . Cellulitis and abscess of foot    Left-Dr. Ether Griffins  . CHF (congestive heart failure) (HCC)    EF 20%  . CKD (chronic kidney disease), stage III   . Diabetes mellitus without complication (HCC)    a. Dx ~ 1996.  [name] Dysrhythmia   . Essential hypertension   . Gastritis    a. 04/2015 hematemesis -> EGD: gastritis, esophagitis, duodenitis.  No active bleeding.  PPI added.  [name] GERD (gastroesophageal reflux disease)   . Left leg DVT (HCC)    a. Dx 05/2015 -> Coumadin.  . MI (myocardial infarction) (HCC)   . Osteomyelitis (HCC)    a. 05/2015 L foot.    Patient Active Problem List   Diagnosis Date Noted  . Pleural effusion 01/25/2017  . CHF exacerbation (HCC) 01/14/2017  . Acute on chronic renal failure (HCC) 01/09/2017  . SBP (spontaneous bacterial peritonitis) (HCC) 12/05/2016  . Hyperglycemia 10/28/2016  . Sepsis (HCC) 10/17/2016  . Chest pain 09/24/2016  . Acute-on-chronic kidney injury (HCC) 09/24/2016    Past Surgical History:  Procedure Laterality Date  . CARDIAC CATHETERIZATION Right 10/26/2016   Procedure:  Left Heart Cath and Coronary Angiography;  Surgeon: Laurier Nancy, MD;  Location: [facility] INVASIVE CV LAB;  Service: Cardiovascular;  Laterality: Right;  . DIALYSIS/PERMA CATHETER INSERTION N/A 01/17/2017   Procedure: Dialysis/Perma Catheter Insertion;  Surgeon: Annice Needy, MD;  Location: [facility] INVASIVE CV LAB;  Service: Cardiovascular;  Laterality: N/A;  . DIALYSIS/PERMA CATHETER REMOVAL N/A 12/05/2016   Procedure: Dialysis/Perma Catheter Removal;  Surgeon: Renford Dills, MD;  Location: [facility] INVASIVE CV LAB;  Service: Cardiovascular;  Laterality: N/A;  . ESOPHAGOGASTRODUODENOSCOPY N/A 04/07/2015   Procedure: ESOPHAGOGASTRODUODENOSCOPY (EGD);  Surgeon: Midge Minium, MD;  Location: [facility] ENDOSCOPY;  Service: Endoscopy;  Laterality: N/A;  . ESOPHAGOGASTRODUODENOSCOPY (EGD) WITH PROPOFOL Left 06/30/2015   Procedure: ESOPHAGOGASTRODUODENOSCOPY (EGD) WITH PROPOFOL;  Surgeon: Christena Deem, MD;  Location: [facility] ENDOSCOPY;  Service: Endoscopy;  Laterality: Left;  . FOOT SURGERY    . I&D EXTREMITY Left 04/06/2015   Procedure: IRRIGATION AND DEBRIDEMENT EXTREMITY;  Surgeon: Gwyneth Revels, DPM;  Location: [facility] ORS;  Service: Podiatry;  Laterality: Left;  . IRRIGATION AND DEBRIDEMENT FOOT Left 05/21/2015   Procedure: IRRIGATION AND DEBRIDEMENT FOOT;  Surgeon: Linus Galas, MD;  Location: [facility] ORS;  Service: Podiatry;  Laterality: Left;  . IRRIGATION AND DEBRIDEMENT KNEE Right 10/18/2016   Procedure: IRRIGATION AND DEBRIDEMENT KNEE;  Surgeon: Deeann Saint, MD;  Location: [facility] ORS;  Service: Orthopedics;  Laterality: Right;  . KNEE ARTHROSCOPY  10/18/2016   Procedure: ARTHROSCOPY KNEE;  Surgeon: Deeann Saint, MD;  Location: [facility] ORS;  Service: Orthopedics;;  . PERIPHERAL VASCULAR CATHETERIZATION N/A 10/27/2016   Procedure: Dialysis/Perma Catheter Insertion;  Surgeon: Annice NeedyJason S Dew, MD;  Location: [facility] INVASIVE CV LAB;  Service: Cardiovascular;  Laterality: N/A;    Allergies Sulfur  Social History Social History   Substance Use Topics  . Smoking status: Never Smoker  . Smokeless tobacco: Never Used  . Alcohol use No    Review of Systems Constitutional: Negative for fever. Cardiovascular: Negative for chest pain. Respiratory: Negative for shortness of breath. Gastrointestinal: Positive for abdominal pain, vomiting and diarrhea Genitourinary: Negative for dysuria. Musculoskeletal: Negative for back pain. Skin: Negative for rash. Neurological: Negative for headaches, positive for generalized weakness  All systems negative/normal/unremarkable except as stated in the HPI  ____________________________________________   PHYSICAL EXAM:  VITAL SIGNS: ED Triage Vitals [03/21/17 1257]  Enc Vitals Group     BP 122/83     Pulse Rate (!) 113     Resp 18     Temp 97.9 F (36.6 C)     Temp Source Oral     SpO2 94 %     Weight 163 lb (73.9 kg)     Height 6\' 2"  (1.88 m)     Head Circumference      Peak Flow      Pain Score 10     Pain Loc      Pain Edu?      Excl. in GC?     Constitutional: Alert and oriented. Chronically ill-appearing, in no distress Eyes: Conjunctivae are normal. Normal extraocular movements. ENT   Head: Normocephalic and atraumatic.   Nose: No congestion/rhinnorhea.   Mouth/Throat: Mucous membranes are moist.   Neck: No stridor. Cardiovascular: Normal rate, regular rhythm. No murmurs, rubs, or gallops. Respiratory: Normal respiratory effort without tachypnea nor retractions. Breath sounds are clear and equal bilaterally. No wheezes/rales/rhonchi. Gastrointestinal: Diffuse abdominal tenderness, normal bowel sounds. Musculoskeletal: Nontender with normal range of motion in extremities. Peripheral edema is noted Neurologic:  Normal speech and language. No gross focal neurologic deficits are appreciated.  Skin:  Skin is warm, dry and intact. No rash noted. Psychiatric: Depressed mood and affect ___________________________________________  ED  COURSE:  Pertinent labs & imaging results that were available during my care of the patient were reviewed by me and considered in my medical decision making (see chart for details). Patient presents for abdominal pain, vomiting and diarrhea, we will assess with labs and imaging as indicated.   Procedures ____________________________________________   LABS (pertinent positives/negatives)  Labs Reviewed  COMPREHENSIVE METABOLIC PANEL - Abnormal; Notable for the following:       Result Value   Sodium 132 (*)    Potassium 6.0 (*)    Chloride 93 (*)    Glucose, Bld 190 (*)    BUN 49 (*)    Creatinine, Ser 4.36 (*)    Albumin 3.0 (*)    AST 42 (*)    ALT 16 (*)    Total Bilirubin 1.8 (*)    GFR calc non Af Amer 15 (*)    GFR calc Af Amer 17 (*)    All other components within normal limits  LIPASE, BLOOD  URINALYSIS, COMPLETE (UACMP) WITH MICROSCOPIC  CBC  ____________________________________________  FINAL ASSESSMENT AND PLAN  Abdominal pain, vomiting and diarrhea, Cirrhosis, end-stage renal disease, hyperkalemia  Plan: Patient's labs and imaging were dictated above. Patient had presented for persistent abdominal pain and vomiting. Patient is also due for the second half of his  dialysis treatment. He states he cannot tolerate anything by mouth, we have given him IV Dilaudid and Zofran. I have discussed with nephrology who will arrange for dialysis. I think it is unlikely that he has SBP given his current cephalosporin regimen. We will discuss with the hospitalist for admission.   Emily Filbert, MD   Note: This note was generated in part or whole with voice recognition software. Voice recognition is usually quite accurate but there are transcription errors that can and very often do occur. I apologize for any typographical errors that were not detected and corrected.     Emily Filbert, MD 03/21/17 [phone]

## 2017-03-21 NOTE — Progress Notes (Signed)
Hd start 

## 2017-03-22 ENCOUNTER — Inpatient Hospital Stay: Payer: BLUE CROSS/BLUE SHIELD

## 2017-03-22 LAB — BASIC METABOLIC PANEL
Anion gap: 7 (ref 5–15)
BUN: 33 mg/dL — AB (ref 6–20)
CALCIUM: 8.5 mg/dL — AB (ref 8.9–10.3)
CO2: 31 mmol/L (ref 22–32)
Chloride: 99 mmol/L — ABNORMAL LOW (ref 101–111)
Creatinine, Ser: 3.32 mg/dL — ABNORMAL HIGH (ref 0.61–1.24)
GFR calc Af Amer: 24 mL/min — ABNORMAL LOW (ref >60)
GFR calc non Af Amer: 20 mL/min — ABNORMAL LOW (ref >60)
GLUCOSE: 101 mg/dL — AB (ref 65–99)
Potassium: 4.4 mmol/L (ref 3.5–5.1)
Sodium: 137 mmol/L (ref 135–145)

## 2017-03-22 LAB — CBC
HCT: 27.7 % — ABNORMAL LOW (ref 40.0–52.0)
Hemoglobin: 8.5 g/dL — ABNORMAL LOW (ref 13.0–18.0)
MCH: 26.4 pg (ref 26.0–34.0)
MCHC: 30.8 g/dL — AB (ref 32.0–36.0)
MCV: 85.6 fL (ref 80.0–100.0)
Platelets: 215 10*3/uL (ref 150–440)
RBC: 3.23 MIL/uL — ABNORMAL LOW (ref 4.40–5.90)
RDW: 21.9 % — AB (ref 11.5–14.5)
WBC: 5.1 10*3/uL (ref 3.8–10.6)

## 2017-03-22 LAB — GLUCOSE, CAPILLARY
GLUCOSE-CAPILLARY: 90 mg/dL (ref 65–99)
GLUCOSE-CAPILLARY: 132 mg/dL — AB (ref 65–99)
GLUCOSE-CAPILLARY: 115 mg/dL — AB (ref 65–99)
Glucose-Capillary: 132 mg/dL — ABNORMAL HIGH (ref 65–99)

## 2017-03-22 MED ORDER — NEPRO/CARBSTEADY PO LIQD
237.0000 mL | Freq: Two times a day (BID) | ORAL | Status: DC
  Administered 2017-03-24: 237 mL via ORAL

## 2017-03-22 MED ORDER — INSULIN ASPART 100 UNIT/ML ~~LOC~~ SOLN
2.0000 [IU] | Freq: Three times a day (TID) | SUBCUTANEOUS | Status: DC
  Administered 2017-03-22 – 2017-03-25 (×5): 2 [IU] via SUBCUTANEOUS
  Filled 2017-03-22 (×5): qty 1

## 2017-03-22 MED ORDER — PREMIER PROTEIN SHAKE
11.0000 [oz_av] | ORAL | Status: DC
  Administered 2017-03-22 – 2017-03-24 (×3): 11 [oz_av] via ORAL

## 2017-03-22 MED ORDER — IOPAMIDOL (ISOVUE-300) INJECTION 61%: 30.0000 mL | Freq: Once | INTRAVENOUS | Status: DC | PRN

## 2017-03-22 NOTE — Progress Notes (Signed)
Post hd assessment 

## 2017-03-22 NOTE — Progress Notes (Signed)
Pre hd info 

## 2017-03-22 NOTE — Progress Notes (Signed)
Inpatient Diabetes Program Recommendations  AACE/ADA: New Consensus Statement on Inpatient Glycemic Control (2015)  Target Ranges:  Prepandial:   less than 140 mg/dL      Peak postprandial:   less than 180 mg/dL (1-2 hours)      Critically ill patients:  140 - 180 mg/dL   Lab Results  Component Value Date   GLUCAP 90 03/22/2017   HGBA1C 6.1 (H) 12/07/2016    Review of Glycemic Control:  Results for Juan Roberson, [name]. (MRN [id number]) as of 03/22/2017 11:55  Ref. Range 03/21/2017 17:00 03/21/2017 23:15 03/21/2017 23:19 03/22/2017 07:58  Glucose-Capillary Latest Ref Range: 65 - 99 mg/dL 409140 (H) 63 (L) 72 90    Diabetes history: Diabetes Outpatient Diabetes medications: Novolog 4 units tid with meals Current orders for Inpatient glycemic control:  Novolog sensitive tid with meals and HS Novolog 4 units tid with meals  Inpatient Diabetes Program Recommendations:    Please consider reducing Novolog meal coverage to 2 units tid with meals while in hospital.  Thanks, Beryl MeagerJenny Noella Kipnis, RN, BC-ADM Inpatient Diabetes Coordinator Pager [phone] (8a-5p)

## 2017-03-22 NOTE — Progress Notes (Signed)
  End of hd 

## 2017-03-22 NOTE — Progress Notes (Signed)
[employer] - [place] [facility]   PATIENT NAME: Juan Roberson    MR#:  [id number]  DATE OF BIRTH:  12-04-1970  SUBJECTIVE:  CHIEF COMPLAINT:   Chief Complaint  Patient presents with  . Abdominal Pain  Had some complaints about pain, wanting to change to Dilaudid instead of morphine, subjective feeling of abdominal distention, some issues with diet  REVIEW OF SYSTEMS:  Review of Systems  Constitutional: Positive for malaise/fatigue. Negative for chills and fever.  HENT: Negative for congestion, ear discharge, hearing loss and nosebleeds.   Eyes: Negative for blurred vision and double vision.  Respiratory: Negative for cough, shortness of breath and wheezing.   Cardiovascular: Negative for chest pain, palpitations and leg swelling.  Gastrointestinal: Positive for abdominal pain. Negative for constipation, diarrhea, nausea and vomiting.  Genitourinary: Negative for dysuria and urgency.  Musculoskeletal: Negative for myalgias.  Neurological: Negative for dizziness, sensory change, speech change, focal weakness, seizures and headaches.  Psychiatric/Behavioral: Negative for depression.    DRUG ALLERGIES:   Allergies  Allergen Reactions  . Sulfur Other (See Comments)    Pt states that this medication causes renal failure.      VITALS:  Blood pressure 93/69, pulse 97, temperature 98.4 F (36.9 C), temperature source Oral, resp. rate 18, height 6\' 2"  (1.88 m), weight 80.5 kg (177 lb 7.5 oz), SpO2 91 %.  PHYSICAL EXAMINATION:  Physical Exam  GENERAL:  46 y.o.-year-old patient lying in the bed with no acute distress.  EYES: Pupils equal, round, reactive to light and accommodation. Minimal  scleral icterus. Extraocular muscles intact.  HEENT: Head atraumatic, normocephalic. Oropharynx and nasopharynx clear.  NECK:  Supple, no jugular venous distention. No thyroid enlargement, no tenderness.  LUNGS: Normal breath sounds bilaterally, no wheezing, rales,rhonchi  or crepitation. No use of accessory muscles of respiration. Decreased bibasilar breath sounds CARDIOVASCULAR: S1, S2 normal. No murmurs, rubs, or gallops.  ABDOMEN: Soft, nontender, nondistended. hypoactive Bowel sounds present. No organomegaly or mass.  EXTREMITIES: No pedal edema, cyanosis, or clubbing.  NEUROLOGIC: Cranial nerves II through XII are intact. Muscle strength 5/5 in all extremities. Sensation intact. Gait not checked.  PSYCHIATRIC: The patient is alert and oriented x 3.  SKIN: No obvious rash, lesion, or ulcer.  LABORATORY PANEL:   CBC  Recent Labs Lab 03/22/17 0446  WBC 5.1  HGB 8.5*  HCT 27.7*  PLT 215   ------------------------------------------------------------------------------------------------------------------  Chemistries   Recent Labs Lab 03/21/17 1337 03/22/17 0446  NA 132* 137  K 6.0* 4.4  CL 93* 99*  CO2 27 31  GLUCOSE 190* 101*  BUN 49* 33*  CREATININE 4.36* 3.32*  CALCIUM 9.0 8.5*  AST 42*  --   ALT 16*  --   ALKPHOS 72  --   BILITOT 1.8*  --    ------------------------------------------------------------------------------------------------------------------  Cardiac Enzymes  Recent Labs Lab 03/20/17 1105  TROPONINI 0.05*   ------------------------------------------------------------------------------------------------------------------  RADIOLOGY:  US Paracentesis  Result Date: 03/20/2017 INDICATION: Recurrent ascites, abdominal distension EXAM: ULTRASOUND GUIDED PARACENTESIS MEDICATIONS: 1% lidocaine locally COMPLICATIONS: None immediate. PROCEDURE: An ultrasound guided paracentesis was thoroughly discussed with the patient and questions answered. The benefits, risks, alternatives and complications were also discussed. The patient understands and wishes to proceed with the procedure. Written consent was obtained. Ultrasound was performed to localize and mark an adequate pocket of fluid in the left lower quadrant of the abdomen.  The area was then prepped and draped in the normal sterile fashion. 1% Lidocaine was used for local  anesthesia. Under ultrasound guidance a 19 gauge Yueh catheter was introduced. Paracentesis was performed. The catheter was removed and a dressing applied. FINDINGS: A total of approximately 1.2 L of clear amber peritoneal fluid was removed. A fluid sample was not sent for laboratory analysis. IMPRESSION: Successful ultrasound guided paracentesis yielding 1.2 L of ascites. Electronically Signed   By: Judie PetitM.  Shick M.D.   On: 03/20/2017 14:18    EKG:   Orders placed or performed during the hospital encounter of 03/20/17  . ED EKG  . ED EKG    ASSESSMENT AND PLAN:  Juan Roberson  is a 46 y.o. male with a known history of  End-stage renal disease on Tuesday, Thursday and Saturday hemodialysis, systolic CHF chronic with EF of 20%, diabetes mellitus, hypertension, recurrent ascites presents to hospital secondary to worsening abdominal pain and intractable nausea vomiting.  #1 abdominal pain with nausea and vomiting- - Per ultrasound he does not have enough fluid to be re-drained - 1 L drawn on 6/19 and fluid culture pending -Continue prophylaxis for SBP until confirmed. -Continue pain medications and anti-emetics as needed.  #2 hyperkalemia-due to incomplete dialysis before admission -Has been resolved status post hemodialysis yesterday -Is scheduled to have one more HD today  #3 end-stage renal disease on hemodialysis-due to his hyperkalemia, he got dialysis y'day. -nephrology following - on Tue-thur-sat regular dialysis schedule  #4 diabetes mellitus-continue pre-meal NovoLog and also on sliding scale insulin.  #5-asthma-stable, continue inhalers.  #6 DVT prophylaxis-on subcutaneous heparin  #7 Depression and anxiety-continue home medications.  #8 Anemia of chronic disease-hemoglobin stable, monitor    All the records are reviewed and case discussed with Care Management/Social  Workerr. Management plans discussed with the patient, nursing and they are in agreement.  CODE STATUS: Full code  TOTAL TIME TAKING CARE OF THIS PATIENT: 20 minutes.   POSSIBLE D/C in am tomorrow DEPENDING ON CLINICAL CONDITION.   Delfino LovettVipul Innocence Schlotzhauer M.D on 03/22/2017 at 10:57 AM  Between 7am to 6pm - Pager - [phone]  After 6pm go to [email or web address] - Social research officer, governmentpassword EPAS [facility]  [facility]  Office  [phone]  CC: Primary care physician; Corky DownsMasoud, Javed, MD

## 2017-03-22 NOTE — Care Management (Signed)
Patient admitted for abdominal pain.  Patient lives at home with daughter.  Patient chronic HD patient [name]  [name] HD liaison notified.  [facility].  Notified Lorene Dyhristie with [facility] of admission, however I was informed that patient declined services after discharge.  RNCM following

## 2017-03-22 NOTE — Progress Notes (Signed)
Hd start 

## 2017-03-22 NOTE — Progress Notes (Signed)
Initial Nutrition Assessment  DOCUMENTATION CODES:   Severe malnutrition in context of chronic illness  INTERVENTION:  Provide Nepro Shake po BID, each supplement provides 425 kcal and 19 grams protein.  Provide Premier Protein po once daily, each supplement provides 160 kcal and 30 grams of protein.  Encouraged patient to continue small, frequent meals to help obtain adequate calories and protein in setting of poor appetite. Patient will receive bedtime snack on renal diet.  Continue Rena-vit QHS.  NUTRITION DIAGNOSIS:   Malnutrition (Severe) related to chronic illness (CHF, severe ischemic cardiomyopathy, ESRD on HD, ascites) as evidenced by 18 percent weight loss over 4 months, severe depletion of body fat, severe depletion of muscle mass.  GOAL:   Patient will meet greater than or equal to 90% of their needs  MONITOR:   PO intake, Supplement acceptance, Labs, Weight trends, I & O's  REASON FOR ASSESSMENT:   Malnutrition Screening Tool    ASSESSMENT:   46 year old male with PMHx of DM type 2, HTN, ESRD on HD since 01/17/2017, GERD, osteomyelitis left s/p I&D, left leg DVT on coumadin, gastritis, hx MI, severe ischemic cardiomyopathy (EF 20%), chronic systolic CHF, recent cardiac arrest, multiple admissions for SBP presents with worsening abdominal pain, intractable N/V and found to have hyperkalemia due to incomplete dialysis prior to admission.   -Pt s/p another US guided paracentesis on 6/19 (1.2 L fluid removed).  Patient known to this RD from several previous admissions. Patient reports over the past few weeks his appetite had gotten pretty good. He had attempted to continue the small, frequent meals. He reports he was having 3-4 small meals per day. He reports eating things such as salad, steak, and fish, but does not provide any further details. Continues to drink 2 bottles of Premier Protein daily at home and also takes his renal MVI QHS. He reports the N/V and  abdominal pain started about 3 days ago and since then he has not been able to eat as well.   UBW was 212 lbs. Weight continues to fluctuate in setting of fluid. Patient was 211.4 lbs on 10/31/2016 and lost 38.1 lbs (18% body weight) over 4 months, which is significant for time frame.   Medications reviewed and include: amiodarone, Colace, Novolog 0-9 units TID, Novolog 0-5 units QHS, Novolog 4 units TID, Rena-vit QHS, pantoprazole, Miralax.  Labs reviewed: CBG 63-140 past 24 hrs, Chloride 99, BUN 33, Creatinine 3.32. Potassium 4.4 today (trended down from 6 on 6/20). Last Phosphorus was 6.2 on 03/08/2017.  Nutrition-Focused physical exam completed. Findings are severe fat depletion, severe muscle depletion, and no edema. Distended abdomen.   Discussed with RN.  Diet Order:  Diet renal with fluid restriction Fluid restriction: 1200 mL Fluid; Room service appropriate? Yes; Fluid consistency: Thin  Skin:  Reviewed, no issues  Last BM:  PTA (03/20/2017 per chart)  Height:   Ht Readings from Last 1 Encounters:  03/21/17 6\' 2"  (1.88 m)    Weight:   Wt Readings from Last 1 Encounters:  03/21/17 177 lb 7.5 oz (80.5 kg)    Ideal Body Weight:  86.4 kg  BMI:  Body mass index is 22.79 kg/m.  Estimated Nutritional Needs:   Kcal:  2270-2620 (MSJ x 1.3-1.5)  Protein:  105-120 grams (1.3-1.5 grams/kg)  Fluid:  UOP + 1 L  EDUCATION NEEDS:   No education needs identified at this time  Helane RimaLeanne Reyanne Hussar, MS, RD, LDN Pager: [phone] After Hours Pager: [phone]

## 2017-03-22 NOTE — Progress Notes (Signed)
To Dialysis via bed with transporter, Melissa. No acute distress at this time.

## 2017-03-22 NOTE — Progress Notes (Signed)
Pre hd assessment  

## 2017-03-22 NOTE — Progress Notes (Signed)
[facility]  ROUNDING NOTE   Subjective:  Patient returns for abdominal pain which is located in the peri-umblical area and radiates to mid left abdomen Underwent urgent dialysis yesterday for hyperkalemia This morning, potassium is 4.4 Overall doing fair  Still reports abdominal pain off and on   Objective:  Vital signs in last 24 hours:  Temp:  [98.4 F (36.9 C)-98.9 F (37.2 C)] 98.4 F (36.9 C) (06/21 0612) Pulse Rate:  [92-114] 97 (06/21 0612) Resp:  [10-28] 18 (06/21 0612) BP: (86-136)/(63-92) 93/69 (06/21 0612) SpO2:  [91 %-100 %] 91 % (06/21 0612) Weight:  [80.5 kg (177 lb 7.5 oz)-80.5 kg (177 lb 8 oz)] 80.5 kg (177 lb 7.5 oz) (06/20 1945)  Weight change:  Filed Weights   03/21/17 1257 03/21/17 1657 03/21/17 1945  Weight: 73.9 kg (163 lb) 80.5 kg (177 lb 8 oz) 80.5 kg (177 lb 7.5 oz)    Intake/Output: I/O last 3 completed shifts: In: -  Out: 1550 [Urine:50; Other:1500]   Intake/Output this shift:  No intake/output data recorded.  Physical Exam: General: No acute distress  Head: Normocephalic, atraumatic. Moist oral mucosal membranes  Eyes: Anicteric  Neck: Supple,    Lungs:  Clear to auscultation, normal effort  Heart: S1S2 no rubs  Abdomen:  periumblical tenderness radiating to left abdomen  Extremities: +  peripheral edema.  Neurologic: Awake, alert, following commands  Skin: No lesions  Access: L IJ permcath    Basic Metabolic Panel:  Recent Labs Lab 03/20/17 1105 03/21/17 1337 03/22/17 0446  NA 132* 132* 137  K 5.5* 6.0* 4.4  CL 96* 93* 99*  CO2 27 27 31   GLUCOSE 215* 190* 101*  BUN 62* 49* 33*  CREATININE 4.90* 4.36* 3.32*  CALCIUM 8.6* 9.0 8.5*    Liver Function Tests:  Recent Labs Lab 03/20/17 1105 03/21/17 1337  AST 18 42*  ALT 11* 16*  ALKPHOS 64 72  BILITOT 1.1 1.8*  PROT 6.8 8.1  ALBUMIN 2.5* 3.0*    Recent Labs Lab 03/20/17 1105 03/21/17 1337  LIPASE 30 25   No results for input(s): AMMONIA in the  last 168 hours.  CBC:  Recent Labs Lab 03/20/17 1105 03/21/17 1420 03/22/17 0446  WBC 7.9 7.6 5.1  NEUTROABS 5.8  --   --   HGB 8.5* 8.9* 8.5*  HCT 26.8* 28.9* 27.7*  MCV 84.8 86.3 85.6  PLT 229 235 215    Cardiac Enzymes:  Recent Labs Lab 03/20/17 1105  TROPONINI 0.05*    BNP: Invalid input(s): POCBNP  CBG:  Recent Labs Lab 03/21/17 1700 03/21/17 2315 03/21/17 2319 03/22/17 0758 03/22/17 1144  GLUCAP 140* 63* 72 90 132*    Microbiology: Results for orders placed or performed during the hospital encounter of 03/07/17  Body fluid culture     Status: None   Collection Time: 03/08/17 11:38 AM  Result Value Ref Range Status   Specimen Description PERITONEAL  Final   Special Requests NONE  Final   Gram Stain   Final    WBC PRESENT, PREDOMINANTLY PMN NO ORGANISMS SEEN CYTOSPIN SMEAR    Culture   Final    NO GROWTH 3 DAYS Performed at [facility], [address] [place], [place] [address]    Report Status 03/11/2017 FINAL  Final  MRSA PCR Screening     Status: Abnormal   Collection Time: 03/08/17  3:56 PM  Result Value Ref Range Status   MRSA by PCR POSITIVE (A) NEGATIVE Final  Comment:        The GeneXpert MRSA Assay (FDA approved for NASAL specimens only), is one component of a comprehensive MRSA colonization surveillance program. It is not intended to diagnose MRSA infection nor to guide or monitor treatment for MRSA infections. CRITICAL RESULT CALLED TO, READ BACK BY AND VERIFIED WITH: ZANETTA HARRIS AT 1729 ON 03/08/2017 JJB     Coagulation Studies: No results for input(s): LABPROT, INR in the last 72 hours.  Urinalysis: No results for input(s): COLORURINE, LABSPEC, PHURINE, GLUCOSEU, HGBUR, BILIRUBINUR, KETONESUR, PROTEINUR, UROBILINOGEN, NITRITE, LEUKOCYTESUR in the last 72 hours.  Invalid input(s): APPERANCEUR    Imaging: No results found.   Medications:       Assessment/ Plan:  46 y.o. male with hypertension,  diabetes mellitus type 2, ESRD on HD, peripheral neuropathy, atrial fibrillation, multiple episodes of SBP, depression, hyperthyroidism, cardiac arrest during planned eye surgery secondary to anesthesia, severe CHF - EF 30 % in April 2018  [name]   1. ESRD on HD TTS: With Hyperkalemia Routine hemodialysis today to get him back on schedule  2. LE edema - Fluid removal with HD as tolerated 1200 cc fluid restricted diet 1500 cc removed with dialysis yesterday  3. Anemia chronic kidney disease.  Continue Epogen  4. Secondary hyperparathyroidism.  Monitor phos level  5. Spontaneous bacterial peritonitis. The patient has had a number recurrent episodes of peritonitis. Patient was taking prophylactic antibiotics as an outpatient.  On previous imaging, a large loculated collection of fluid was noted   LOS: 1 Juan Roberson 6/21/20181:47 PM

## 2017-03-23 LAB — GLUCOSE, CAPILLARY
Glucose-Capillary: 106 mg/dL — ABNORMAL HIGH (ref 65–99)
Glucose-Capillary: 107 mg/dL — ABNORMAL HIGH (ref 65–99)
Glucose-Capillary: 123 mg/dL — ABNORMAL HIGH (ref 65–99)
Glucose-Capillary: 127 mg/dL — ABNORMAL HIGH (ref 65–99)

## 2017-03-23 MED ORDER — ALBUMIN HUMAN 25 % IV SOLN: 25.0000 g | Freq: Once | INTRAVENOUS | Status: DC

## 2017-03-23 MED ORDER — MIDODRINE HCL 5 MG PO TABS
10.0000 mg | ORAL_TABLET | Freq: Three times a day (TID) | ORAL | Status: DC
  Administered 2017-03-23 – 2017-03-25 (×5): 10 mg via ORAL
  Filled 2017-03-23 (×6): qty 2

## 2017-03-23 MED ORDER — ALBUMIN HUMAN 25 % IV SOLN
25.0000 g | Freq: Once | INTRAVENOUS | Status: DC
  Filled 2017-03-23: qty 100

## 2017-03-23 MED ORDER — ALBUMIN HUMAN 25 % IV SOLN
25.0000 g | Freq: Once | INTRAVENOUS | Status: AC
  Administered 2017-03-23: 25 g via INTRAVENOUS
  Filled 2017-03-23: qty 100

## 2017-03-23 MED ORDER — DIPHENHYDRAMINE HCL 25 MG PO CAPS
25.0000 mg | ORAL_CAPSULE | Freq: Four times a day (QID) | ORAL | Status: DC | PRN
  Administered 2017-03-23 (×2): 25 mg via ORAL
  Filled 2017-03-23 (×2): qty 1

## 2017-03-23 NOTE — Progress Notes (Signed)
[facility]  ROUNDING NOTE   Subjective:    Overall doing fair  Still reports abdominal pain off and on 1700 cc of fluid was removed with HD CT of abdomen shows smaller fluid collection, but confirms anasarca BP is low normal    Objective:  Vital signs in last 24 hours:  Temp:  [97.9 F (36.6 C)-98.8 F (37.1 C)] 98.1 F (36.7 C) (06/22 1544) Pulse Rate:  [74-106] 91 (06/22 1544) Resp:  [11-20] 17 (06/22 1544) BP: (75-108)/(53-86) 90/53 (06/22 1544) SpO2:  [90 %-98 %] 95 % (06/22 1544) Weight:  [83.2 kg (183 lb 6.8 oz)-85 kg (187 lb 6.3 oz)] 83.2 kg (183 lb 6.8 oz) (06/22 1510)  Weight change:  Filed Weights   03/21/17 1945 03/23/17 1210 03/23/17 1510  Weight: 80.5 kg (177 lb 7.5 oz) 85 kg (187 lb 6.3 oz) 83.2 kg (183 lb 6.8 oz)    Intake/Output: I/O last 3 completed shifts: In: 0  Out: 3250 [Urine:50; Other:3200]   Intake/Output this shift:  Total I/O In: 0  Out: 1550 [Urine:50; Other:1500]  Physical Exam: General: No acute distress  Head: Normocephalic, atraumatic. Moist oral mucosal membranes  Eyes: Anicteric  Neck: Supple,    Lungs:  Decreased breath sounds at bases, normal effort, b/l crackles  Heart: S1S2 no rubs  Abdomen:  periumblical tenderness radiating to left abdomen  Extremities: +  Peripheral and dependent edema.  Neurologic: Awake, alert, following commands  Skin: No lesions  Access: L IJ permcath    Basic Metabolic Panel:  Recent Labs Lab 03/20/17 1105 03/21/17 1337 03/22/17 0446  NA 132* 132* 137  K 5.5* 6.0* 4.4  CL 96* 93* 99*  CO2 27 27 31   GLUCOSE 215* 190* 101*  BUN 62* 49* 33*  CREATININE 4.90* 4.36* 3.32*  CALCIUM 8.6* 9.0 8.5*    Liver Function Tests:  Recent Labs Lab 03/20/17 1105 03/21/17 1337  AST 18 42*  ALT 11* 16*  ALKPHOS 64 72  BILITOT 1.1 1.8*  PROT 6.8 8.1  ALBUMIN 2.5* 3.0*    Recent Labs Lab 03/20/17 1105 03/21/17 1337  LIPASE 30 25   No results for input(s): AMMONIA in the  last 168 hours.  CBC:  Recent Labs Lab 03/20/17 1105 03/21/17 1420 03/22/17 0446  WBC 7.9 7.6 5.1  NEUTROABS 5.8  --   --   HGB 8.5* 8.9* 8.5*  HCT 26.8* 28.9* 27.7*  MCV 84.8 86.3 85.6  PLT 229 235 215    Cardiac Enzymes:  Recent Labs Lab 03/20/17 1105  TROPONINI 0.05*    BNP: Invalid input(s): POCBNP  CBG:  Recent Labs Lab 03/22/17 1144 03/22/17 1658 03/22/17 2251 03/23/17 0726 03/23/17 1135  GLUCAP 132* 132* 115* 106* 107*    Microbiology: Results for orders placed or performed during the hospital encounter of 03/07/17  Body fluid culture     Status: None   Collection Time: 03/08/17 11:38 AM  Result Value Ref Range Status   Specimen Description PERITONEAL  Final   Special Requests NONE  Final   Gram Stain   Final    WBC PRESENT, PREDOMINANTLY PMN NO ORGANISMS SEEN CYTOSPIN SMEAR    Culture   Final    NO GROWTH 3 DAYS Performed at [facility], [address] [place], [place] [address]    Report Status 03/11/2017 FINAL  Final  MRSA PCR Screening     Status: Abnormal   Collection Time: 03/08/17  3:56 PM  Result Value Ref Range Status  MRSA by PCR POSITIVE (A) NEGATIVE Final    Comment:        The GeneXpert MRSA Assay (FDA approved for NASAL specimens only), is one component of a comprehensive MRSA colonization surveillance program. It is not intended to diagnose MRSA infection nor to guide or monitor treatment for MRSA infections. CRITICAL RESULT CALLED TO, READ BACK BY AND VERIFIED WITH: ZANETTA HARRIS AT 1729 ON 03/08/2017 JJB     Coagulation Studies: No results for input(s): LABPROT, INR in the last 72 hours.  Urinalysis: No results for input(s): COLORURINE, LABSPEC, PHURINE, GLUCOSEU, HGBUR, BILIRUBINUR, KETONESUR, PROTEINUR, UROBILINOGEN, NITRITE, LEUKOCYTESUR in the last 72 hours.  Invalid input(s): APPERANCEUR    Imaging: Ct Abdomen Pelvis Wo Contrast  Result Date: 03/22/2017 CLINICAL DATA:  End-stage renal disease  with abdominal distention and pain. EXAM: CT ABDOMEN AND PELVIS WITHOUT CONTRAST TECHNIQUE: Multidetector CT imaging of the abdomen and pelvis was performed following the standard protocol without IV contrast. COMPARISON:  Feb 06, 2017 FINDINGS: Lower chest: Large bilateral pleural effusions are identified. There is compression atelectasis of the visualized right lung base. The heart size is enlarged. Hepatobiliary: The liver is unremarkable. No focal liver lesions identified. Residual contrast identified the gallbladder. The biliary tree is normal. Pancreas: Unremarkable. No pancreatic ductal dilatation or surrounding inflammatory changes. Spleen: No focal mass or abnormalities identified in the spleen. Adrenals/Urinary Tract: The adrenal glands are normal. No focal renal lesions identified. Renal vascular calcifications noted. The bladder is decompressed limiting evaluation. Stomach/Bowel: There is no bowel obstruction. The appendix is not seen but no inflammation is noted to suggest appendicitis. Stomach is normal. Vascular/Lymphatic: Aortic atherosclerosis. No enlarged abdominal or pelvic lymph nodes. Reproductive: Prostate is unremarkable. Other: Mild to moderate ascites is identified in the abdomen and pelvis decrease compared prior exam. There is third spacing of fluid with fluid identified in the subcutaneous fat of abdomen and pelvis. Musculoskeletal: Degenerative joint changes of the spine are noted. IMPRESSION: Mild to moderate ascites is identified in the abdomen and pelvis, decrease compared prior CT. Large bilateral pleural effusions. Electronically Signed   By: Sherian ReinWei-Chen  Lin M.D.   On: 03/22/2017 19:45     Medications:       Assessment/ Plan:  46 y.o. male with hypertension, diabetes mellitus type 2, ESRD on HD, peripheral neuropathy, atrial fibrillation, multiple episodes of SBP, depression, hyperthyroidism, cardiac arrest during planned eye surgery secondary to anesthesia, severe CHF - EF  30 % in April 2018  [name]   1. ESRD on HD TTS: With Hyperkalemia Extra HD treatment today for volume removal  2. LE edema - Fluid removal with HD as tolerated 1200 cc fluid restricted diet 1700 cc removed with dialysis yesterday  3. Anemia chronic kidney disease.  Continue Epogen  4. Secondary hyperparathyroidism.  Monitor phos level  5. Abdominal pain -  The patient has had a number recurrent episodes of peritonitis. Patient was taking prophylactic antibiotics as an outpatient.  On previous imaging, a large loculated collection of fluid was noted- repeat CT shows smaller collection, large pleural effusion and anasarca   LOS: 2 Jalyah Weinheimer 6/22/20184:03 PM

## 2017-03-23 NOTE — Progress Notes (Signed)
HD STARTED  

## 2017-03-23 NOTE — Progress Notes (Signed)
PRE DIALYSIS ASSESSMENT 

## 2017-03-23 NOTE — Progress Notes (Signed)
HD COMPLETED  

## 2017-03-23 NOTE — Progress Notes (Signed)
[facility] [place] [facility]   PATIENT NAME: Juan Roberson    MR#:  [id number]  DATE OF BIRTH:  07/18/1971  SUBJECTIVE:  CHIEF COMPLAINT:   Chief Complaint  Patient presents with  . Abdominal Pain  hypotensive. Itching REVIEW OF SYSTEMS:  Review of Systems  Constitutional: Positive for malaise/fatigue. Negative for chills and fever.  HENT: Negative for congestion, ear discharge, hearing loss and nosebleeds.   Eyes: Negative for blurred vision and double vision.  Respiratory: Negative for cough, shortness of breath and wheezing.   Cardiovascular: Negative for chest pain, palpitations and leg swelling.  Gastrointestinal: Positive for abdominal pain. Negative for constipation, diarrhea, nausea and vomiting.  Genitourinary: Negative for dysuria and urgency.  Musculoskeletal: Negative for myalgias.  Neurological: Negative for dizziness, sensory change, speech change, focal weakness, seizures and headaches.  Psychiatric/Behavioral: Negative for depression.   DRUG ALLERGIES:   Allergies  Allergen Reactions  . Sulfur Other (See Comments)    Pt states that this medication causes renal failure.      VITALS:  Blood pressure 104/76, pulse 97, temperature 98 F (36.7 C), temperature source Oral, resp. rate 18, height 6\' 2"  (1.88 m), weight 85 kg (187 lb 6.3 oz), SpO2 93 %.  PHYSICAL EXAMINATION:  Physical Exam  GENERAL:  46 y.o.-year-old patient lying in the bed with no acute distress.  EYES: Pupils equal, round, reactive to light and accommodation. Minimal  scleral icterus. Extraocular muscles intact.  HEENT: Head atraumatic, normocephalic. Oropharynx and nasopharynx clear.  NECK:  Supple, no jugular venous distention. No thyroid enlargement, no tenderness.  LUNGS: Normal breath sounds bilaterally, no wheezing, rales,rhonchi or crepitation. No use of accessory muscles of respiration. Decreased bibasilar breath sounds CARDIOVASCULAR: S1, S2 normal. No  murmurs, rubs, or gallops.  ABDOMEN: Soft, nontender, nondistended. hypoactive Bowel sounds present. No organomegaly or mass.  EXTREMITIES: No pedal edema, cyanosis, or clubbing.  NEUROLOGIC: Cranial nerves II through XII are intact. Muscle strength 5/5 in all extremities. Sensation intact. Gait not checked.  PSYCHIATRIC: The patient is alert and oriented x 3.  SKIN: No obvious rash, lesion, or ulcer.  LABORATORY PANEL:   CBC  Recent Labs Lab 03/22/17 0446  WBC 5.1  HGB 8.5*  HCT 27.7*  PLT 215   ------------------------------------------------------------------------------------------------------------------  Chemistries   Recent Labs Lab 03/21/17 1337 03/22/17 0446  NA 132* 137  K 6.0* 4.4  CL 93* 99*  CO2 27 31  GLUCOSE 190* 101*  BUN 49* 33*  CREATININE 4.36* 3.32*  CALCIUM 9.0 8.5*  AST 42*  --   ALT 16*  --   ALKPHOS 72  --   BILITOT 1.8*  --    ------------------------------------------------------------------------------------------------------------------  Cardiac Enzymes  Recent Labs Lab 03/20/17 1105  TROPONINI 0.05*   ------------------------------------------------------------------------------------------------------------------  RADIOLOGY:  Ct Abdomen Pelvis Wo Contrast  Result Date: 03/22/2017 CLINICAL DATA:  End-stage renal disease with abdominal distention and pain. EXAM: CT ABDOMEN AND PELVIS WITHOUT CONTRAST TECHNIQUE: Multidetector CT imaging of the abdomen and pelvis was performed following the standard protocol without IV contrast. COMPARISON:  Feb 06, 2017 FINDINGS: Lower chest: Large bilateral pleural effusions are identified. There is compression atelectasis of the visualized right lung base. The heart size is enlarged. Hepatobiliary: The liver is unremarkable. No focal liver lesions identified. Residual contrast identified the gallbladder. The biliary tree is normal. Pancreas: Unremarkable. No pancreatic ductal dilatation or surrounding  inflammatory changes. Spleen: No focal mass or abnormalities identified in the spleen. Adrenals/Urinary Tract: The adrenal glands  are normal. No focal renal lesions identified. Renal vascular calcifications noted. The bladder is decompressed limiting evaluation. Stomach/Bowel: There is no bowel obstruction. The appendix is not seen but no inflammation is noted to suggest appendicitis. Stomach is normal. Vascular/Lymphatic: Aortic atherosclerosis. No enlarged abdominal or pelvic lymph nodes. Reproductive: Prostate is unremarkable. Other: Mild to moderate ascites is identified in the abdomen and pelvis decrease compared prior exam. There is third spacing of fluid with fluid identified in the subcutaneous fat of abdomen and pelvis. Musculoskeletal: Degenerative joint changes of the spine are noted. IMPRESSION: Mild to moderate ascites is identified in the abdomen and pelvis, decrease compared prior CT. Large bilateral pleural effusions. Electronically Signed   By: Sherian Rein M.D.   On: 03/22/2017 19:45    EKG:   Orders placed or performed during the hospital encounter of 03/20/17  . ED EKG  . ED EKG   ASSESSMENT AND PLAN:  Juan Roberson  is a 46 y.o. male with a known history of  End-stage renal disease on Tuesday, Thursday and Saturday hemodialysis, systolic CHF chronic with EF of 20%, diabetes mellitus, hypertension, recurrent ascites presents to hospital secondary to worsening abdominal pain and intractable nausea vomiting.  #1 Abdominal pain with nausea and vomiting- - Per ultrasound he does not have enough fluid to be re-drained - 1 L drawn on 6/19 and fluid culture pending - Continue prophylaxis for SBP until confirmed. - Continue pain medications and anti-emetics as needed.  #2 Hyperkalemia-due to incomplete dialysis before admission -Has been resolved status post hemodialysis  -Is scheduled to have one more HD today  #3 End-stage renal disease on hemodialysis-due to his hyperkalemia,  he got dialysis y'day. -nephrology following - on Tue-thur-sat regular dialysis schedule  #4 Diabetes mellitus-continue pre-meal NovoLog and also on sliding scale insulin.  #5-Asthma-stable, continue inhalers.  #6 Hypotension: increase the dose of midodrine to 10 mg PO tid. BP in 80-90s  #7 Depression and anxiety-continue home medications.  #8 Anemia of chronic disease-hemoglobin stable, monitor    All the records are reviewed and case discussed with Care Management/Social Workerr. Management plans discussed with the patient, nursing, Dr Thedore Mins and they are in agreement.  CODE STATUS: Full code  TOTAL TIME TAKING CARE OF THIS PATIENT: 25 minutes.   POSSIBLE D/C in 1-2 DAYS DEPENDING ON CLINICAL CONDITION.   Delfino Lovett M.D on 03/23/2017 at 12:30 PM  Between 7am to 6pm - Pager - [phone]  After 6pm go to [email or web address] - Social research officer, government  [facility]  Office  [phone]  CC: Primary care physician; Corky Downs, MD

## 2017-03-23 NOTE — Care Management (Signed)
Per patient he declined home health services after previous admission due to the copay.  Patient states "i don't have enough money for all of the copays they were going to charge me."  [facility].  [facility] that [facility] would have cop[facility] for home health services.  [facility] willing to accept patient back if wishes to proceed at time of discharge.

## 2017-03-23 NOTE — Progress Notes (Signed)
POST DIALYSIS ASSESSMENT 

## 2017-03-24 LAB — CBC
HEMATOCRIT: 29.1 % — AB (ref 40.0–52.0)
HEMOGLOBIN: 8.9 g/dL — AB (ref 13.0–18.0)
MCH: 26.7 pg (ref 26.0–34.0)
MCHC: 30.6 g/dL — ABNORMAL LOW (ref 32.0–36.0)
MCV: 87.3 fL (ref 80.0–100.0)
Platelets: 218 10*3/uL (ref 150–440)
RBC: 3.34 MIL/uL — AB (ref 4.40–5.90)
RDW: 21.2 % — ABNORMAL HIGH (ref 11.5–14.5)
WBC: 5 10*3/uL (ref 3.8–10.6)

## 2017-03-24 LAB — GLUCOSE, CAPILLARY
GLUCOSE-CAPILLARY: 100 mg/dL — AB (ref 65–99)
GLUCOSE-CAPILLARY: 151 mg/dL — AB (ref 65–99)
GLUCOSE-CAPILLARY: 80 mg/dL (ref 65–99)

## 2017-03-24 LAB — BASIC METABOLIC PANEL
ANION GAP: 9 (ref 5–15)
BUN: 22 mg/dL — ABNORMAL HIGH (ref 6–20)
CO2: 30 mmol/L (ref 22–32)
Calcium: 8.4 mg/dL — ABNORMAL LOW (ref 8.9–10.3)
Chloride: 99 mmol/L — ABNORMAL LOW (ref 101–111)
Creatinine, Ser: 2.72 mg/dL — ABNORMAL HIGH (ref 0.61–1.24)
GFR calc non Af Amer: 26 mL/min — ABNORMAL LOW (ref >60)
GFR, EST AFRICAN AMERICAN: 30 mL/min — AB (ref >60)
Glucose, Bld: 134 mg/dL — ABNORMAL HIGH (ref 65–99)
Potassium: 4.3 mmol/L (ref 3.5–5.1)
SODIUM: 138 mmol/L (ref 135–145)

## 2017-03-24 MED ORDER — CHLORHEXIDINE GLUCONATE CLOTH 2 % EX PADS
6.0000 | MEDICATED_PAD | Freq: Every day | CUTANEOUS | Status: DC
  Administered 2017-03-25: 6 via TOPICAL

## 2017-03-24 MED ORDER — MUPIROCIN 2 % EX OINT
1.0000 "application " | TOPICAL_OINTMENT | Freq: Two times a day (BID) | CUTANEOUS | Status: DC
  Administered 2017-03-24 – 2017-03-25 (×2): 1 via NASAL
  Filled 2017-03-24: qty 22

## 2017-03-24 MED ORDER — GUAIFENESIN-DM 100-10 MG/5ML PO SYRP
5.0000 mL | ORAL_SOLUTION | ORAL | Status: DC | PRN
  Administered 2017-03-24 – 2017-03-25 (×2): 5 mL via ORAL
  Filled 2017-03-24 (×2): qty 5

## 2017-03-24 MED ORDER — MIDODRINE HCL 10 MG PO TABS: 10.0000 mg | ORAL_TABLET | Freq: Three times a day (TID) | ORAL | 1 refills | Status: DC

## 2017-03-24 NOTE — Progress Notes (Signed)
POST DIALYSIS ASSESSMENT 

## 2017-03-24 NOTE — Progress Notes (Signed)
[facility]  ROUNDING NOTE   Subjective:    Overall doing fair  Did not report abdominal pain today Looks comfortable, watching TV He is able to eat without nausea or vomiting 1500 cc of fluid was removed with HD      Objective:  Vital signs in last 24 hours:  Temp:  [98 F (36.7 C)-98.6 F (37 C)] 98 F (36.7 C) (06/23 0548) Pulse Rate:  [74-102] 102 (06/23 0548) Resp:  [13-20] 18 (06/23 0548) BP: (86-117)/(53-82) 117/82 (06/23 0548) SpO2:  [93 %-96 %] 93 % (06/23 0548) Weight:  [83.2 kg (183 lb 6.8 oz)-85 kg (187 lb 6.3 oz)] 83.2 kg (183 lb 6.8 oz) (06/22 1510)  Weight change:  Filed Weights   03/21/17 1945 03/23/17 1210 03/23/17 1510  Weight: 80.5 kg (177 lb 7.5 oz) 85 kg (187 lb 6.3 oz) 83.2 kg (183 lb 6.8 oz)    Intake/Output: I/O last 3 completed shifts: In: 0  Out: 3250 [Urine:50; Other:3200]   Intake/Output this shift:  No intake/output data recorded.  Physical Exam: General: No acute distress  Head: Normocephalic, atraumatic. Moist oral mucosal membranes  Eyes: Anicteric  Neck: Supple,    Lungs:  Decreased breath sounds at bases, normal effort,    Heart: S1S2 no rubs  Abdomen:  periumblical tenderness radiating to left abdomen  Extremities: +  Peripheral and dependent edema.  Neurologic: Awake, alert, following commands  Skin: No lesions  Access: L IJ permcath    Basic Metabolic Panel:  Recent Labs Lab 03/20/17 1105 03/21/17 1337 03/22/17 0446 03/24/17 0320  NA 132* 132* 137 138  K 5.5* 6.0* 4.4 4.3  CL 96* 93* 99* 99*  CO2 27 27 31 30   GLUCOSE 215* 190* 101* 134*  BUN 62* 49* 33* 22*  CREATININE 4.90* 4.36* 3.32* 2.72*  CALCIUM 8.6* 9.0 8.5* 8.4*    Liver Function Tests:  Recent Labs Lab 03/20/17 1105 03/21/17 1337  AST 18 42*  ALT 11* 16*  ALKPHOS 64 72  BILITOT 1.1 1.8*  PROT 6.8 8.1  ALBUMIN 2.5* 3.0*    Recent Labs Lab 03/20/17 1105 03/21/17 1337  LIPASE 30 25   No results for input(s): AMMONIA in  the last 168 hours.  CBC:  Recent Labs Lab 03/20/17 1105 03/21/17 1420 03/22/17 0446 03/24/17 0320  WBC 7.9 7.6 5.1 5.0  NEUTROABS 5.8  --   --   --   HGB 8.5* 8.9* 8.5* 8.9*  HCT 26.8* 28.9* 27.7* 29.1*  MCV 84.8 86.3 85.6 87.3  PLT 229 235 215 218    Cardiac Enzymes:  Recent Labs Lab 03/20/17 1105  TROPONINI 0.05*    BNP: Invalid input(s): POCBNP  CBG:  Recent Labs Lab 03/23/17 0726 03/23/17 1135 03/23/17 1646 03/23/17 2140 03/24/17 0745  GLUCAP 106* 107* 123* 127* 100*    Microbiology: Results for orders placed or performed during the hospital encounter of 03/07/17  Body fluid culture     Status: None   Collection Time: 03/08/17 11:38 AM  Result Value Ref Range Status   Specimen Description PERITONEAL  Final   Special Requests NONE  Final   Gram Stain   Final    WBC PRESENT, PREDOMINANTLY PMN NO ORGANISMS SEEN CYTOSPIN SMEAR    Culture   Final    NO GROWTH 3 DAYS Performed at [facility], [address] [place], [place] [address]    Report Status 03/11/2017 FINAL  Final  MRSA PCR Screening     Status:  Abnormal   Collection Time: 03/08/17  3:56 PM  Result Value Ref Range Status   MRSA by PCR POSITIVE (A) NEGATIVE Final    Comment:        The GeneXpert MRSA Assay (FDA approved for NASAL specimens only), is one component of a comprehensive MRSA colonization surveillance program. It is not intended to diagnose MRSA infection nor to guide or monitor treatment for MRSA infections. CRITICAL RESULT CALLED TO, READ BACK BY AND VERIFIED WITH: ZANETTA HARRIS AT 1729 ON 03/08/2017 JJB     Coagulation Studies: No results for input(s): LABPROT, INR in the last 72 hours.  Urinalysis: No results for input(s): COLORURINE, LABSPEC, PHURINE, GLUCOSEU, HGBUR, BILIRUBINUR, KETONESUR, PROTEINUR, UROBILINOGEN, NITRITE, LEUKOCYTESUR in the last 72 hours.  Invalid input(s): APPERANCEUR    Imaging: Ct Abdomen Pelvis Wo Contrast  Result Date:  03/22/2017 CLINICAL DATA:  End-stage renal disease with abdominal distention and pain. EXAM: CT ABDOMEN AND PELVIS WITHOUT CONTRAST TECHNIQUE: Multidetector CT imaging of the abdomen and pelvis was performed following the standard protocol without IV contrast. COMPARISON:  Feb 06, 2017 FINDINGS: Lower chest: Large bilateral pleural effusions are identified. There is compression atelectasis of the visualized right lung base. The heart size is enlarged. Hepatobiliary: The liver is unremarkable. No focal liver lesions identified. Residual contrast identified the gallbladder. The biliary tree is normal. Pancreas: Unremarkable. No pancreatic ductal dilatation or surrounding inflammatory changes. Spleen: No focal mass or abnormalities identified in the spleen. Adrenals/Urinary Tract: The adrenal glands are normal. No focal renal lesions identified. Renal vascular calcifications noted. The bladder is decompressed limiting evaluation. Stomach/Bowel: There is no bowel obstruction. The appendix is not seen but no inflammation is noted to suggest appendicitis. Stomach is normal. Vascular/Lymphatic: Aortic atherosclerosis. No enlarged abdominal or pelvic lymph nodes. Reproductive: Prostate is unremarkable. Other: Mild to moderate ascites is identified in the abdomen and pelvis decrease compared prior exam. There is third spacing of fluid with fluid identified in the subcutaneous fat of abdomen and pelvis. Musculoskeletal: Degenerative joint changes of the spine are noted. IMPRESSION: Mild to moderate ascites is identified in the abdomen and pelvis, decrease compared prior CT. Large bilateral pleural effusions. Electronically Signed   By: Sherian Rein M.D.   On: 03/22/2017 19:45     Medications:   . amiodarone  200 mg Oral Daily  . aspirin EC  81 mg Oral Daily  . cefpodoxime  200 mg Oral Q T,Th,Sat-1800  . docusate sodium  100 mg Oral BID  . feeding supplement (NEPRO CARB STEADY)  237 mL Oral BID BM  . fluticasone  furoate-vilanterol  1 puff Inhalation Daily  . heparin  5,000 Units Subcutaneous Q8H  . insulin aspart  0-5 Units Subcutaneous QHS  . insulin aspart  0-9 Units Subcutaneous TID WC  . insulin aspart  2 Units Subcutaneous TID WC  . methimazole  10 mg Oral Daily  . midodrine  10 mg Oral TID WC  . multivitamin  1 tablet Oral QHS  . pantoprazole  40 mg Oral Daily  . polyethylene glycol  17 g Oral QHS  . protein supplement shake  11 oz Oral Q24H  . sertraline  50 mg Oral Daily      Assessment/ Plan:  46 y.o. male with hypertension, diabetes mellitus type 2, ESRD on HD, peripheral neuropathy, atrial fibrillation, multiple episodes of SBP, depression, hyperthyroidism, cardiac arrest during planned eye surgery secondary to anesthesia, severe CHF - EF 30 % in April 2018  [name]   1.  ESRD on HD TTS: With Hyperkalemia HD today to get him back on schedule  2. LE edema - Fluid removal with HD as tolerated 1200 cc fluid restricted diet 1700 cc removed with dialysis 6/21 1500 cc removed 6/22 Fluid removal with HD today as tolerated  3. Anemia chronic kidney disease.  Continue Epogen  4. Secondary hyperparathyroidism.  Monitor phos level Currently not on binder   5. Abdominal pain -  The patient has had a number recurrent episodes of peritonitis. Patient was taking prophylactic antibiotics as an outpatient.  On previous imaging, a large loculated collection of fluid was noted- repeat CT shows smaller collection, large pleural effusion and anasarca- Fluid removal with HD and iv albumin as tolerated   LOS: 3 Juan Roberson 6/23/201811:20 AM

## 2017-03-24 NOTE — Discharge Summary (Addendum)
[facility] [place] at [facility]   PATIENT NAME: Juan Roberson    MR#:  [id number]  DATE OF BIRTH:  09/18/1971  DATE OF ADMISSION:  03/21/2017 ADMITTING PHYSICIAN: Enid Baas, MD  DATE OF DISCHARGE: 03/25/2017  PRIMARY CARE PHYSICIAN: Corky Downs, MD    ADMISSION DIAGNOSIS:  End stage renal disease (HCC) [N18.6] Generalized abdominal pain [R10.84] Intractable vomiting with nausea, unspecified vomiting type [R11.2]  DISCHARGE DIAGNOSIS:  Active Problems:   Hyperkalemia   SECONDARY DIAGNOSIS:   Past Medical History:  Diagnosis Date  . Cellulitis and abscess of foot    Left-Dr. Ether Griffins  . CHF (congestive heart failure) (HCC)    EF 20%  . CKD (chronic kidney disease), stage III   . Diabetes mellitus without complication (HCC)    a. Dx ~ 1996.  [name] Dysrhythmia   . Essential hypertension   . Gastritis    a. 04/2015 hematemesis -> EGD: gastritis, esophagitis, duodenitis.  No active bleeding.  PPI added.  [name] GERD (gastroesophageal reflux disease)   . Left leg DVT (HCC)    a. Dx 05/2015 -> Coumadin.  . MI (myocardial infarction) (HCC)   . Osteomyelitis (HCC)    a. 05/2015 L foot.    HOSPITAL COURSE:   Rashid Whitenight a 46 y.o. malewith a known history of End-stage renal disease on Tuesday, Thursday and Saturday hemodialysis, systolic CHF chronic with EF of 20%, diabetes mellitus, hypertension, recurrent ascites presents to hospital secondary to worsening abdominal pain and intractable nausea vomiting.  #1 Abdominal pain with nausea and vomiting  - pt. Underwent US guided paracentesis in the ER and had 1 L of fluid removed.   -Patient's abdominal pain and nausea vomiting has improved with supportive care. He will continue his prophylactic medication for history of recurrent SBP.  #2 Hyperkalemia-due to incomplete dialysis before admission -This has improved and resolved with hemodialysis now.  #3 End-stage renal disease on hemodialysis-patient  was seen by nephrology and maintained on his Tuesday Thursday Saturday schedule of dialysis. He received an extra dialysis due to his hypokalemia. He will resume his schedule as mentioned.  #4 Diabetes mellitus-blood sugars remained stable on the hospital he will continue his NovoLog 3 times a day with meals.  #5-Asthma-stable, pt. Will resume his Breo Inhaler.   #6 Hypotension: Chronic for the patient secondary to his severe cardiomyopathy. Patient was only taking Midodrine 3 times a week on days of dialysis. This has now been increased to 10 mg TID Daily and his Hypotension has improved.    #7 Depression and anxiety- he will cont. His Zoloft  #8 Anemia of chronic disease- due to ESRD.  Hg. Stable and pt. Did not require any transfusions while in the hospital.   DISCHARGE CONDITIONS:   Stable.   CONSULTS OBTAINED:    DRUG ALLERGIES:   Allergies  Allergen Reactions  . Sulfur Other (See Comments)    Pt states that this medication causes renal failure.      DISCHARGE MEDICATIONS:   Allergies as of 03/24/2017      Reactions   Sulfur Other (See Comments)   Pt states that this medication causes renal failure.        Medication List    TAKE these medications   amiodarone 200 MG tablet Commonly known as:  PACERONE Take 1 tablet (200 mg total) by mouth daily.   aspirin 81 MG EC tablet Take 1 tablet (81 mg total) by mouth daily.   cefpodoxime 200 MG tablet Commonly  known as:  VANTIN Take 1 tablet (200 mg total) by mouth every Tuesday, Thursday, and Saturday at 6 PM. Take in the evening after dialysis- life long   fluticasone furoate-vilanterol 100-25 MCG/INH Aepb Commonly known as:  BREO ELLIPTA Inhale 1 puff into the lungs daily.   insulin aspart 100 UNIT/ML injection Commonly known as:  novoLOG Inject 4 Units into the skin 3 (three) times daily with meals.   methimazole 10 MG tablet Commonly known as:  TAPAZOLE Take 1 tablet (10 mg total) by mouth daily.    midodrine 10 MG tablet Commonly known as:  PROAMATINE Take 1 tablet (10 mg total) by mouth 3 (three) times daily with meals. What changed:  medication strength  how much to take  how to take this  when to take this  additional instructions   multivitamin Tabs tablet Take 1 tablet by mouth at bedtime.   pantoprazole 40 MG tablet Commonly known as:  PROTONIX Take 1 tablet (40 mg total) by mouth daily.   sertraline 50 MG tablet Commonly known as:  ZOLOFT Take 50 mg by mouth daily.   zolpidem 5 MG tablet Commonly known as:  AMBIEN Take 5 mg by mouth at bedtime as needed for sleep.         DISCHARGE INSTRUCTIONS:   DIET:  Renal diet  DISCHARGE CONDITION:  Stable  ACTIVITY:  Activity as tolerated  OXYGEN:  Home Oxygen: No.   Oxygen Delivery: room air  DISCHARGE LOCATION:  home   If you experience worsening of your admission symptoms, develop shortness of breath, life threatening emergency, suicidal or homicidal thoughts you must seek medical attention immediately by calling 911 or calling your MD immediately  if symptoms less severe.  You Must read complete instructions/literature along with all the possible adverse reactions/side effects for all the Medicines you take and that have been prescribed to you. Take any new Medicines after you have completely understood and accpet all the possible adverse reactions/side effects.   Please note  You were cared for by a hospitalist during your hospital stay. If you have any questions about your discharge medications or the care you received while you were in the hospital after you are discharged, you can call the unit and asked to speak with the hospitalist on call if the hospitalist that took care of you is not available. Once you are discharged, your primary care physician will handle any further medical issues. Please note that NO REFILLS for any discharge medications will be authorized once you are discharged, as  it is imperative that you return to your primary care physician (or establish a relationship with a primary care physician if you do not have one) for your aftercare needs so that they can reassess your need for medications and monitor your lab values.     Today   No abdominal pain, N/V.  Hypotension improved.    VITAL SIGNS:  Blood pressure 117/82, pulse (!) 102, temperature 98 F (36.7 C), temperature source Oral, resp. rate 18, height 6\' 2"  (1.88 m), weight 83.2 kg (183 lb 6.8 oz), SpO2 93 %.  I/O:   Intake/Output Summary (Last 24 hours) at 03/24/17 1322 Last data filed at 03/24/17 0800  Gross per 24 hour  Intake                0 ml  Output             1500 ml  Net            -  1500 ml    PHYSICAL EXAMINATION:  GENERAL:  46 y.o.-year-old patient lying in the bed with no acute distress.  EYES: Pupils equal, round, reactive to light and accommodation. No scleral icterus. Extraocular muscles intact.  HEENT: Head atraumatic, normocephalic. Oropharynx and nasopharynx clear.  NECK:  Supple, no jugular venous distention. No thyroid enlargement, no tenderness.  LUNGS: Normal breath sounds bilaterally, no wheezing, rales,rhonchi. No use of accessory muscles of respiration.  CARDIOVASCULAR: S1, S2 normal. No murmurs, rubs, or gallops.  ABDOMEN: Soft, non-tender, Slightly distended. Bowel sounds present. No organomegaly or mass.  EXTREMITIES: No pedal edema, cyanosis, or clubbing.  NEUROLOGIC: Cranial nerves II through XII are intact. No focal motor or sensory defecits b/l.  PSYCHIATRIC: The patient is alert and oriented x 3. Good affect.  SKIN: No obvious rash, lesion, or ulcer.   DATA REVIEW:   CBC  Recent Labs Lab 03/24/17 0320  WBC 5.0  HGB 8.9*  HCT 29.1*  PLT 218    Chemistries   Recent Labs Lab 03/21/17 1337  03/24/17 0320  NA 132*  < > 138  K 6.0*  < > 4.3  CL 93*  < > 99*  CO2 27  < > 30  GLUCOSE 190*  < > 134*  BUN 49*  < > 22*  CREATININE 4.36*  < >  2.72*  CALCIUM 9.0  < > 8.4*  AST 42*  --   --   ALT 16*  --   --   ALKPHOS 72  --   --   BILITOT 1.8*  --   --   < > = values in this interval not displayed.  Cardiac Enzymes  Recent Labs Lab 03/20/17 1105  TROPONINI 0.05*    RADIOLOGY:  Ct Abdomen Pelvis Wo Contrast  Result Date: 03/22/2017 CLINICAL DATA:  End-stage renal disease with abdominal distention and pain. EXAM: CT ABDOMEN AND PELVIS WITHOUT CONTRAST TECHNIQUE: Multidetector CT imaging of the abdomen and pelvis was performed following the standard protocol without IV contrast. COMPARISON:  Feb 06, 2017 FINDINGS: Lower chest: Large bilateral pleural effusions are identified. There is compression atelectasis of the visualized right lung base. The heart size is enlarged. Hepatobiliary: The liver is unremarkable. No focal liver lesions identified. Residual contrast identified the gallbladder. The biliary tree is normal. Pancreas: Unremarkable. No pancreatic ductal dilatation or surrounding inflammatory changes. Spleen: No focal mass or abnormalities identified in the spleen. Adrenals/Urinary Tract: The adrenal glands are normal. No focal renal lesions identified. Renal vascular calcifications noted. The bladder is decompressed limiting evaluation. Stomach/Bowel: There is no bowel obstruction. The appendix is not seen but no inflammation is noted to suggest appendicitis. Stomach is normal. Vascular/Lymphatic: Aortic atherosclerosis. No enlarged abdominal or pelvic lymph nodes. Reproductive: Prostate is unremarkable. Other: Mild to moderate ascites is identified in the abdomen and pelvis decrease compared prior exam. There is third spacing of fluid with fluid identified in the subcutaneous fat of abdomen and pelvis. Musculoskeletal: Degenerative joint changes of the spine are noted. IMPRESSION: Mild to moderate ascites is identified in the abdomen and pelvis, decrease compared prior CT. Large bilateral pleural effusions. Electronically Signed    By: Sherian ReinWei-Chen  Lin M.D.   On: 03/22/2017 19:45      Management plans discussed with the patient, family and they are in agreement.  CODE STATUS:     Code Status Orders        Start     Ordered   03/21/17 1644  Full code  Continuous  03/21/17 1643      TOTAL TIME TAKING CARE OF THIS PATIENT: 40 minutes.    Houston Siren M.D on 03/24/2017 at 1:22 PM  Between 7am to 6pm - Pager - [phone]  After 6pm go to [email or web address] - Social research officer, government  [facility] [place] [facility]  Office  [phone]  CC: Primary care physician; Corky Downs, MD

## 2017-03-24 NOTE — Progress Notes (Signed)
Hd completed 

## 2017-03-24 NOTE — Progress Notes (Signed)
PRE DIALYSIS ASSESSMENT 

## 2017-03-24 NOTE — Progress Notes (Signed)
HD STARTED  

## 2017-03-25 LAB — GLUCOSE, CAPILLARY: Glucose-Capillary: 73 mg/dL (ref 65–99)

## 2017-03-25 NOTE — Progress Notes (Signed)
[facility] - [place] at [facility]   PATIENT NAME: Juan Roberson    MR#:  [id number]  DATE OF BIRTH:  1971-04-22  SUBJECTIVE:   No acute events overnight. BP improved.  Tolerating PO well.  No N/V.    REVIEW OF SYSTEMS:  Review of Systems  Constitutional: Negative for chills, fever and malaise/fatigue.  HENT: Negative for congestion, ear discharge, hearing loss and nosebleeds.   Eyes: Negative for blurred vision and double vision.  Respiratory: Negative for cough, shortness of breath and wheezing.   Cardiovascular: Negative for chest pain, palpitations and leg swelling.  Gastrointestinal: Negative for abdominal pain, constipation, diarrhea, nausea and vomiting.  Genitourinary: Negative for dysuria and urgency.  Musculoskeletal: Negative for myalgias.  Neurological: Negative for dizziness, sensory change, speech change, focal weakness, seizures and headaches.  Psychiatric/Behavioral: Negative for depression.   DRUG ALLERGIES:   Allergies  Allergen Reactions  . Sulfur Other (See Comments)    Pt states that this medication causes renal failure.      VITALS:  Blood pressure 105/69, pulse 73, temperature 97.9 F (36.6 C), temperature source Oral, resp. rate 20, height 6\' 2"  (1.88 m), weight 79.6 kg (175 lb 7.8 oz), SpO2 92 %.  PHYSICAL EXAMINATION:  Physical Exam  GENERAL:  46 y.o.-year-old patient lying in the bed with no acute distress.  EYES: Pupils equal, round, reactive to light and accommodation. Minimal  scleral icterus. Extraocular muscles intact.  HEENT: Head atraumatic, normocephalic. Oropharynx and nasopharynx clear.  NECK:  Supple, no jugular venous distention. No thyroid enlargement, no tenderness.  LUNGS: Normal breath sounds bilaterally, no wheezing, rales,rhonchi or crepitation. No use of accessory muscles of respiration. Decreased bibasilar breath sounds CARDIOVASCULAR: S1, S2 normal. No murmurs, rubs, or gallops.  ABDOMEN: Soft, nontender,  nondistended. hypoactive Bowel sounds present. No organomegaly or mass.  EXTREMITIES: No pedal edema, cyanosis, or clubbing.  NEUROLOGIC: Cranial nerves II through XII are intact. No focal motor or sensory deficits appreciated bilaterally PSYCHIATRIC: The patient is alert and oriented x 3.  SKIN: No obvious rash, lesion, or ulcer.   LABORATORY PANEL:   CBC  Recent Labs Lab 03/24/17 0320  WBC 5.0  HGB 8.9*  HCT 29.1*  PLT 218   ------------------------------------------------------------------------------------------------------------------  Chemistries   Recent Labs Lab 03/21/17 1337  03/24/17 0320  NA 132*  < > 138  K 6.0*  < > 4.3  CL 93*  < > 99*  CO2 27  < > 30  GLUCOSE 190*  < > 134*  BUN 49*  < > 22*  CREATININE 4.36*  < > 2.72*  CALCIUM 9.0  < > 8.4*  AST 42*  --   --   ALT 16*  --   --   ALKPHOS 72  --   --   BILITOT 1.8*  --   --   < > = values in this interval not displayed. ------------------------------------------------------------------------------------------------------------------  Cardiac Enzymes  Recent Labs Lab 03/20/17 1105  TROPONINI 0.05*   ------------------------------------------------------------------------------------------------------------------  RADIOLOGY:  No results found.  EKG:   Orders placed or performed during the hospital encounter of 03/20/17  . ED EKG  . ED EKG   ASSESSMENT AND PLAN:  Juan Roberson  is a 46 y.o. male with a known history of  End-stage renal disease on Tuesday, Thursday and Saturday hemodialysis, systolic CHF chronic with EF of 20%, diabetes mellitus, hypertension, recurrent ascites presents to hospital secondary to worsening abdominal pain and intractable nausea vomiting.  #1 Abdominal pain  with nausea and vomiting-Now resolved. Tolerating by mouth well with no nausea or vomiting. Patient had ultrasound-guided paracentesis done in the ER with 1 L of fluid removed. No evidence of SBP presently. -  Continue prophylaxis for SBP until confirmed. - Continue pain medications and anti-emetics as needed.  #2 Hyperkalemia-due to incomplete dialysis before admission -Has been resolved status post hemodialysis  -Plan for hemodialysis again later today.  #3 End-stage renal disease on hemodialysis- -nephrology following - on Tue-thur-sat regular dialysis schedule  #4 Diabetes mellitus-continue pre-meal NovoLog and also on sliding scale insulin. -Blood sugar stable  #5-Asthma-stable, continue inhalers.  #6 Hypotension: much improved w/ Midodrine and will cont. To monitor.  - asymptomatic.   #7 Depression and anxiety-continue home medications.  #8 Anemia of chronic disease-hemoglobin stable, monitor   All the records are reviewed and case discussed with Care Management/Social Workerr. Management plans discussed with the patient, nursing, Dr Thedore MinsSingh and they are in agreement.  CODE STATUS: Full code  TOTAL TIME TAKING CARE OF THIS PATIENT: 25 minutes.   POSSIBLE D/C in 1-2 DAYS DEPENDING ON CLINICAL CONDITION.   [place] SirenSAINANI,Lariah Fleer J M.D on 03/25/2017 at 11:45 AM  Between 7am to 6pm - Pager - [phone]  After 6pm go to [email or web address] - Social research officer, governmentpassword [employer]AS [email or web address]  [facility] [place] [facility]  Office  [phone]  CC: Primary care physician; Corky DownsMasoud, Javed, MD

## 2017-03-25 NOTE — Progress Notes (Signed)
[facility]  ROUNDING NOTE   Subjective:    Overall doing fair  No c/o abdominal pain today Looks comfortable, watching TV He is able to eat without nausea or vomiting 2000 cc of fluid was removed with HD      Objective:  Vital signs in last 24 hours:  Temp:  [97.9 F (36.6 C)-99 F (37.2 C)] 97.9 F (36.6 C) (06/24 0507) Pulse Rate:  [73-105] 73 (06/24 0507) Resp:  [6-20] 20 (06/24 0507) BP: (101-130)/(66-93) 105/69 (06/24 0507) SpO2:  [92 %-97 %] 92 % (06/24 0507) Weight:  [79.6 kg (175 lb 7.8 oz)-81.5 kg (179 lb 10.8 oz)] 79.6 kg (175 lb 7.8 oz) (06/23 1845)  Weight change: -3.5 kg (-7 lb 11.5 oz) Filed Weights   03/23/17 1510 03/24/17 1510 03/24/17 1845  Weight: 83.2 kg (183 lb 6.8 oz) 81.5 kg (179 lb 10.8 oz) 79.6 kg (175 lb 7.8 oz)    Intake/Output: I/O last 3 completed shifts: In: 0  Out: 2000 [Other:2000]   Intake/Output this shift:  No intake/output data recorded.  Physical Exam: General: No acute distress  Head: Normocephalic, atraumatic. Moist oral mucosal membranes  Eyes: Anicteric  Neck: Supple,    Lungs:  Decreased breath sounds at bases, normal effort,    Heart: S1S2 no rubs, irregular  Abdomen:  periumblical tenderness radiating to left abdomen  Extremities: +  Peripheral and dependent edema.  Neurologic: Awake, alert, following commands  Skin: No lesions  Access: L IJ permcath    Basic Metabolic Panel:  Recent Labs Lab 03/20/17 1105 03/21/17 1337 03/22/17 0446 03/24/17 0320  NA 132* 132* 137 138  K 5.5* 6.0* 4.4 4.3  CL 96* 93* 99* 99*  CO2 27 27 31 30   GLUCOSE 215* 190* 101* 134*  BUN 62* 49* 33* 22*  CREATININE 4.90* 4.36* 3.32* 2.72*  CALCIUM 8.6* 9.0 8.5* 8.4*    Liver Function Tests:  Recent Labs Lab 03/20/17 1105 03/21/17 1337  AST 18 42*  ALT 11* 16*  ALKPHOS 64 72  BILITOT 1.1 1.8*  PROT 6.8 8.1  ALBUMIN 2.5* 3.0*    Recent Labs Lab 03/20/17 1105 03/21/17 1337  LIPASE 30 25   No results  for input(s): AMMONIA in the last 168 hours.  CBC:  Recent Labs Lab 03/20/17 1105 03/21/17 1420 03/22/17 0446 03/24/17 0320  WBC 7.9 7.6 5.1 5.0  NEUTROABS 5.8  --   --   --   HGB 8.5* 8.9* 8.5* 8.9*  HCT 26.8* 28.9* 27.7* 29.1*  MCV 84.8 86.3 85.6 87.3  PLT 229 235 215 218    Cardiac Enzymes:  Recent Labs Lab 03/20/17 1105  TROPONINI 0.05*    BNP: Invalid input(s): POCBNP  CBG:  Recent Labs Lab 03/23/17 2140 03/24/17 0745 03/24/17 1155 03/24/17 2146 03/25/17 0743  GLUCAP 127* 100* 151* 80 73    Microbiology: Results for orders placed or performed during the hospital encounter of 03/07/17  Body fluid culture     Status: None   Collection Time: 03/08/17 11:38 AM  Result Value Ref Range Status   Specimen Description PERITONEAL  Final   Special Requests NONE  Final   Gram Stain   Final    WBC PRESENT, PREDOMINANTLY PMN NO ORGANISMS SEEN CYTOSPIN SMEAR    Culture   Final    NO GROWTH 3 DAYS Performed at [facility], [address], [place], [place] [address]    Report Status 03/11/2017 FINAL  Final  MRSA PCR Screening  Status: Abnormal   Collection Time: 03/08/17  3:56 PM  Result Value Ref Range Status   MRSA by PCR POSITIVE (A) NEGATIVE Final    Comment:        The GeneXpert MRSA Assay (FDA approved for NASAL specimens only), is one component of a comprehensive MRSA colonization surveillance program. It is not intended to diagnose MRSA infection nor to guide or monitor treatment for MRSA infections. CRITICAL RESULT CALLED TO, READ BACK BY AND VERIFIED WITH: ZANETTA HARRIS AT 1729 ON 03/08/2017 JJB     Coagulation Studies: No results for input(s): LABPROT, INR in the last 72 hours.  Urinalysis: No results for input(s): COLORURINE, LABSPEC, PHURINE, GLUCOSEU, HGBUR, BILIRUBINUR, KETONESUR, PROTEINUR, UROBILINOGEN, NITRITE, LEUKOCYTESUR in the last 72 hours.  Invalid input(s): APPERANCEUR    Imaging: No results  found.   Medications:   . amiodarone  200 mg Oral Daily  . aspirin EC  81 mg Oral Daily  . cefpodoxime  200 mg Oral Q T,Th,Sat-1800  . Chlorhexidine Gluconate Cloth  6 each Topical Q0600  . docusate sodium  100 mg Oral BID  . feeding supplement (NEPRO CARB STEADY)  237 mL Oral BID BM  . fluticasone furoate-vilanterol  1 puff Inhalation Daily  . heparin  5,000 Units Subcutaneous Q8H  . insulin aspart  0-5 Units Subcutaneous QHS  . insulin aspart  0-9 Units Subcutaneous TID WC  . insulin aspart  2 Units Subcutaneous TID WC  . methimazole  10 mg Oral Daily  . midodrine  10 mg Oral TID WC  . multivitamin  1 tablet Oral QHS  . mupirocin ointment  1 application Nasal BID  . pantoprazole  40 mg Oral Daily  . polyethylene glycol  17 g Oral QHS  . protein supplement shake  11 oz Oral Q24H  . sertraline  50 mg Oral Daily      Assessment/ Plan:  46 y.o. male with hypertension, diabetes mellitus type 2, ESRD on HD, peripheral neuropathy, atrial fibrillation, multiple episodes of SBP, depression, hyperthyroidism, cardiac arrest during planned eye surgery secondary to anesthesia, severe CHF - EF 30 % in April 2018  [name]   1. ESRD on HD TTS: With Hyperkalemia Next HD on Tuesday 79.5 kg post HD   2. LE edema - Fluid removal with HD as tolerated  1700 cc removed 6/21 1500 cc removed 6/22 2000 cc removed 6/23  3. Anemia chronic kidney disease.  Continue Epogen  4. Secondary hyperparathyroidism.  Monitor phos level Currently not on binder   5. Abdominal pain -  The patient has had a number recurrent episodes of peritonitis. Patient was taking prophylactic antibiotics as an outpatient.  On previous imaging, a large loculated collection of fluid was noted- repeat CT shows smaller collection, large pleural effusion and anasarca- Fluid removal with HD and iv albumin as tolerated - clinically improved   LOS: 4 Chealsea Paske 6/24/20189:44 AM

## 2017-03-25 NOTE — Progress Notes (Signed)
Pt d/c home; d/c instructions reviewed w/ pt; pt understanding was verbalized; IV removed, catheter in tact, gauze dressing applied; all pt questions answered; pt verbalized that all pt belongings were accounted for; pt left unit via wheelchair accompanied by staff 

## 2017-04-02 ENCOUNTER — Other Ambulatory Visit: Payer: Self-pay | Admitting: Nephrology

## 2017-04-02 DIAGNOSIS — R188 Other ascites: Secondary | ICD-10-CM

## 2017-04-03 ENCOUNTER — Inpatient Hospital Stay
Admission: RE | Admit: 2017-04-03 | Discharge: 2017-04-03 | Disposition: A | Discharge status: Home / Self Care | Payer: BLUE CROSS/BLUE SHIELD | Source: Ambulatory Visit | Attending: Nephrology | Admitting: Nephrology

## 2017-04-03 ENCOUNTER — Inpatient Hospital Stay
Admission: EM | Admit: 2017-04-03 | Discharge: 2017-04-05 | DRG: 186 | Disposition: A | Discharge status: Home / Self Care | Payer: BLUE CROSS/BLUE SHIELD | Attending: Specialist | Admitting: Specialist

## 2017-04-03 ENCOUNTER — Other Ambulatory Visit: Payer: Self-pay

## 2017-04-03 ENCOUNTER — Emergency Department: Payer: BLUE CROSS/BLUE SHIELD

## 2017-04-03 DIAGNOSIS — E1142 Type 2 diabetes mellitus with diabetic polyneuropathy: Secondary | ICD-10-CM | POA: Diagnosis present

## 2017-04-03 DIAGNOSIS — L89151 Pressure ulcer of sacral region, stage 1: Secondary | ICD-10-CM | POA: Diagnosis present

## 2017-04-03 DIAGNOSIS — Z992 Dependence on renal dialysis: Secondary | ICD-10-CM | POA: Diagnosis not present

## 2017-04-03 DIAGNOSIS — J189 Pneumonia, unspecified organism: Secondary | ICD-10-CM | POA: Diagnosis present

## 2017-04-03 DIAGNOSIS — D631 Anemia in chronic kidney disease: Secondary | ICD-10-CM | POA: Diagnosis present

## 2017-04-03 DIAGNOSIS — I5042 Chronic combined systolic (congestive) and diastolic (congestive) heart failure: Secondary | ICD-10-CM | POA: Diagnosis present

## 2017-04-03 DIAGNOSIS — K219 Gastro-esophageal reflux disease without esophagitis: Secondary | ICD-10-CM | POA: Diagnosis present

## 2017-04-03 DIAGNOSIS — N186 End stage renal disease: Secondary | ICD-10-CM | POA: Diagnosis present

## 2017-04-03 DIAGNOSIS — Z86718 Personal history of other venous thrombosis and embolism: Secondary | ICD-10-CM

## 2017-04-03 DIAGNOSIS — Z794 Long term (current) use of insulin: Secondary | ICD-10-CM | POA: Diagnosis not present

## 2017-04-03 DIAGNOSIS — I482 Chronic atrial fibrillation: Secondary | ICD-10-CM | POA: Diagnosis present

## 2017-04-03 DIAGNOSIS — N2581 Secondary hyperparathyroidism of renal origin: Secondary | ICD-10-CM | POA: Diagnosis present

## 2017-04-03 DIAGNOSIS — I252 Old myocardial infarction: Secondary | ICD-10-CM | POA: Diagnosis not present

## 2017-04-03 DIAGNOSIS — F329 Major depressive disorder, single episode, unspecified: Secondary | ICD-10-CM | POA: Diagnosis present

## 2017-04-03 DIAGNOSIS — Z79899 Other long term (current) drug therapy: Secondary | ICD-10-CM | POA: Diagnosis not present

## 2017-04-03 DIAGNOSIS — R0902 Hypoxemia: Secondary | ICD-10-CM | POA: Diagnosis not present

## 2017-04-03 DIAGNOSIS — A419 Sepsis, unspecified organism: Secondary | ICD-10-CM

## 2017-04-03 DIAGNOSIS — Z91048 Other nonmedicinal substance allergy status: Secondary | ICD-10-CM | POA: Diagnosis not present

## 2017-04-03 DIAGNOSIS — I959 Hypotension, unspecified: Secondary | ICD-10-CM | POA: Diagnosis present

## 2017-04-03 DIAGNOSIS — Z8674 Personal history of sudden cardiac arrest: Secondary | ICD-10-CM

## 2017-04-03 DIAGNOSIS — I429 Cardiomyopathy, unspecified: Secondary | ICD-10-CM | POA: Diagnosis present

## 2017-04-03 DIAGNOSIS — I10 Essential (primary) hypertension: Secondary | ICD-10-CM | POA: Diagnosis present

## 2017-04-03 DIAGNOSIS — E1022 Type 1 diabetes mellitus with diabetic chronic kidney disease: Secondary | ICD-10-CM

## 2017-04-03 DIAGNOSIS — I132 Hypertensive heart and chronic kidney disease with heart failure and with stage 5 chronic kidney disease, or end stage renal disease: Secondary | ICD-10-CM | POA: Diagnosis present

## 2017-04-03 DIAGNOSIS — R188 Other ascites: Secondary | ICD-10-CM

## 2017-04-03 DIAGNOSIS — J90 Pleural effusion, not elsewhere classified: Principal | ICD-10-CM | POA: Diagnosis present

## 2017-04-03 DIAGNOSIS — N183 Chronic kidney disease, stage 3 unspecified: Secondary | ICD-10-CM | POA: Diagnosis present

## 2017-04-03 DIAGNOSIS — E0590 Thyrotoxicosis, unspecified without thyrotoxic crisis or storm: Secondary | ICD-10-CM | POA: Diagnosis present

## 2017-04-03 DIAGNOSIS — Z7982 Long term (current) use of aspirin: Secondary | ICD-10-CM | POA: Diagnosis not present

## 2017-04-03 DIAGNOSIS — L8990 Pressure ulcer of unspecified site, unspecified stage: Secondary | ICD-10-CM | POA: Insufficient documentation

## 2017-04-03 DIAGNOSIS — I5022 Chronic systolic (congestive) heart failure: Secondary | ICD-10-CM | POA: Diagnosis present

## 2017-04-03 DIAGNOSIS — E1122 Type 2 diabetes mellitus with diabetic chronic kidney disease: Secondary | ICD-10-CM | POA: Diagnosis present

## 2017-04-03 DIAGNOSIS — Z8249 Family history of ischemic heart disease and other diseases of the circulatory system: Secondary | ICD-10-CM

## 2017-04-03 DIAGNOSIS — Z9889 Other specified postprocedural states: Secondary | ICD-10-CM

## 2017-04-03 LAB — CBC WITH DIFFERENTIAL/PLATELET
BASOS ABS: 0 10*3/uL (ref 0–0.1)
Basophils Relative: 1 %
Eosinophils Absolute: 0.1 10*3/uL (ref 0–0.7)
Eosinophils Relative: 2 %
HEMATOCRIT: 28 % — AB (ref 40.0–52.0)
Hemoglobin: 8.8 g/dL — ABNORMAL LOW (ref 13.0–18.0)
LYMPHS PCT: 17 %
Lymphs Abs: 1 10*3/uL (ref 1.0–3.6)
MCH: 26.8 pg (ref 26.0–34.0)
MCHC: 31.5 g/dL — ABNORMAL LOW (ref 32.0–36.0)
MCV: 85.2 fL (ref 80.0–100.0)
MONO ABS: 0.6 10*3/uL (ref 0.2–1.0)
Monocytes Relative: 11 %
NEUTROS ABS: 3.8 10*3/uL (ref 1.4–6.5)
Neutrophils Relative %: 69 %
Platelets: 240 10*3/uL (ref 150–440)
RBC: 3.29 MIL/uL — AB (ref 4.40–5.90)
RDW: 20.6 % — ABNORMAL HIGH (ref 11.5–14.5)
WBC: 5.6 10*3/uL (ref 3.8–10.6)

## 2017-04-03 LAB — COMPREHENSIVE METABOLIC PANEL
ALBUMIN: 2.6 g/dL — AB (ref 3.5–5.0)
ALT: 14 U/L — ABNORMAL LOW (ref 17–63)
ANION GAP: 11 (ref 5–15)
AST: 23 U/L (ref 15–41)
Alkaline Phosphatase: 88 U/L (ref 38–126)
BUN: 35 mg/dL — ABNORMAL HIGH (ref 6–20)
CHLORIDE: 98 mmol/L — AB (ref 101–111)
CO2: 28 mmol/L (ref 22–32)
Calcium: 8.3 mg/dL — ABNORMAL LOW (ref 8.9–10.3)
Creatinine, Ser: 2.47 mg/dL — ABNORMAL HIGH (ref 0.61–1.24)
GFR calc Af Amer: 34 mL/min — ABNORMAL LOW (ref >60)
GFR calc non Af Amer: 29 mL/min — ABNORMAL LOW (ref >60)
GLUCOSE: 153 mg/dL — AB (ref 65–99)
POTASSIUM: 3.7 mmol/L (ref 3.5–5.1)
SODIUM: 137 mmol/L (ref 135–145)
TOTAL PROTEIN: 7.2 g/dL (ref 6.5–8.1)
Total Bilirubin: 1.1 mg/dL (ref 0.3–1.2)

## 2017-04-03 LAB — LACTIC ACID, PLASMA: Lactic Acid, Venous: 1.8 mmol/L (ref 0.5–1.9)

## 2017-04-03 LAB — PROTIME-INR
INR: 1.33
Prothrombin Time: 16.6 seconds — ABNORMAL HIGH (ref 11.4–15.2)

## 2017-04-03 MED ORDER — DEXTROSE 5 % IV SOLN
2.0000 g | Freq: Once | INTRAVENOUS | Status: AC
  Administered 2017-04-03: 2 g via INTRAVENOUS
  Filled 2017-04-03: qty 2

## 2017-04-03 MED ORDER — SODIUM CHLORIDE 0.9 % IV BOLUS (SEPSIS)
500.0000 mL | Freq: Once | INTRAVENOUS | Status: AC
  Administered 2017-04-03: 500 mL via INTRAVENOUS

## 2017-04-03 MED ORDER — VANCOMYCIN HCL IN DEXTROSE 1-5 GM/200ML-% IV SOLN
1000.0000 mg | Freq: Once | INTRAVENOUS | Status: AC
  Administered 2017-04-03: 1000 mg via INTRAVENOUS
  Filled 2017-04-03: qty 200

## 2017-04-03 MED ORDER — ACETAMINOPHEN 325 MG PO TABS
650.0000 mg | ORAL_TABLET | Freq: Once | ORAL | Status: AC
  Administered 2017-04-03: 650 mg via ORAL
  Filled 2017-04-03: qty 2

## 2017-04-03 NOTE — ED Provider Notes (Signed)
[facility] Emergency Department Provider Note   ____________________________________________   First MD Initiated Contact with Patient 04/03/17 2132     (approximate)  I have reviewed the triage vital signs and the nursing notes.   HISTORY  Chief Complaint Shortness of Breath    HPI Juan Roberson. is a 46 y.o. male reports 3 days of cough, increasing shortness of breath  Had a paracentesis today, reportedly well. Has mild abdominal discomfort but reports this is a daily thing and no change. No nausea or vomiting. No chills, reports fatigue, increased shortness of breath and a productive cough  He is on antibiotics to cover for prophylaxis  Was recently admitted to the hospital and discharged. No chest pain.  Reports she has a history of flu lungs usually on the right greater than the left   Past Medical History:  Diagnosis Date  . Cellulitis and abscess of foot    Left-Dr. Ether Griffins  . CHF (congestive heart failure) (HCC)    EF 20%  . CKD (chronic kidney disease), stage III   . Diabetes mellitus without complication (HCC)    a. Dx ~ 1996.  [name] Dysrhythmia   . Essential hypertension   . Gastritis    a. 04/2015 hematemesis -> EGD: gastritis, esophagitis, duodenitis.  No active bleeding.  PPI added.  [name] GERD (gastroesophageal reflux disease)   . Left leg DVT (HCC)    a. Dx 05/2015 -> Coumadin.  . MI (myocardial infarction) (HCC)   . Osteomyelitis (HCC)    a. 05/2015 L foot.    Patient Active Problem List   Diagnosis Date Noted  . Hyperkalemia 03/21/2017  . Pleural effusion 01/25/2017  . CHF exacerbation (HCC) 01/14/2017  . Acute on chronic renal failure (HCC) 01/09/2017  . SBP (spontaneous bacterial peritonitis) (HCC) 12/05/2016  . Hyperglycemia 10/28/2016  . Sepsis (HCC) 10/17/2016  . Chest pain 09/24/2016  . Acute-on-chronic kidney injury (HCC) 09/24/2016    Past Surgical History:  Procedure Laterality Date  . CARDIAC  CATHETERIZATION Right 10/26/2016   Procedure: Left Heart Cath and Coronary Angiography;  Surgeon: Laurier Nancy, MD;  Location: [facility] INVASIVE CV LAB;  Service: Cardiovascular;  Laterality: Right;  . DIALYSIS/PERMA CATHETER INSERTION N/A 01/17/2017   Procedure: Dialysis/Perma Catheter Insertion;  Surgeon: Annice Needy, MD;  Location: [facility] INVASIVE CV LAB;  Service: Cardiovascular;  Laterality: N/A;  . DIALYSIS/PERMA CATHETER REMOVAL N/A 12/05/2016   Procedure: Dialysis/Perma Catheter Removal;  Surgeon: Renford Dills, MD;  Location: [facility] INVASIVE CV LAB;  Service: Cardiovascular;  Laterality: N/A;  . ESOPHAGOGASTRODUODENOSCOPY N/A 04/07/2015   Procedure: ESOPHAGOGASTRODUODENOSCOPY (EGD);  Surgeon: Midge Minium, MD;  Location[facility] ENDOSCOPY;  Service: Endoscopy;  Laterality: N/A;  . ESOPHAGOGASTRODUODENOSCOPY (EGD) WITH PROPOFOL Left 06/30/2015   Procedure: ESOPHAGOGASTRODUODENOSCOPY (EGD) WITH PROPOFOL;  Surgeon: Christena Deem, MD;  Location: [facility] ENDOSCOPY;  Service: Endoscopy;  Laterality: Left;  . FOOT SURGERY    . I&D EXTREMITY Left 04/06/2015   Procedure: IRRIGATION AND DEBRIDEMENT EXTREMITY;  Surgeon: Gwyneth Revels, DPM;  Location: [facility] ORS;  Service: Podiatry;  Laterality: Left;  . IRRIGATION AND DEBRIDEMENT FOOT Left 05/21/2015   Procedure: IRRIGATION AND DEBRIDEMENT FOOT;  Surgeon: Linus Galas, MD;  Location: [facility] ORS;  Service: Podiatry;  Laterality: Left;  . IRRIGATION AND DEBRIDEMENT KNEE Right 10/18/2016   Procedure: IRRIGATION AND DEBRIDEMENT KNEE;  Surgeon: Deeann Saint, MD;  Location: [facility] ORS;  Service: Orthopedics;  Laterality: Right;  . KNEE ARTHROSCOPY  10/18/2016   Procedure: ARTHROSCOPY  KNEE;  Surgeon: Deeann Saint, MD;  Location: [facility] ORS;  Service: Orthopedics;;  . PERIPHERAL VASCULAR CATHETERIZATION N/A 10/27/2016   Procedure: Dialysis/Perma Catheter Insertion;  Surgeon: Annice Needy, MD;  Location: [facility] INVASIVE CV LAB;  Service: Cardiovascular;  Laterality: N/A;    Prior to  Admission medications   Medication Sig Start Date End Date Taking? Authorizing Provider  amiodarone (PACERONE) 200 MG tablet Take 1 tablet (200 mg total) by mouth daily. 09/25/16   Shaune Pollack, MD  aspirin EC 81 MG EC tablet Take 1 tablet (81 mg total) by mouth daily. 07/23/16   Alford Highland, MD  cefpodoxime Varney Baas) 200 MG tablet Take 1 tablet (200 mg total) by mouth every Tuesday, Thursday, and Saturday at 6 PM. Take in the evening after dialysis- life long 03/10/17   Enid Baas, MD  fluticasone furoate-vilanterol (BREO ELLIPTA) 100-25 MCG/INH AEPB Inhale 1 puff into the lungs daily.    [provider]  insulin aspart (NOVOLOG) 100 UNIT/ML injection Inject 4 Units into the skin 3 (three) times daily with meals. 10/31/16   Enid Baas, MD  methimazole (TAPAZOLE) 10 MG tablet Take 1 tablet (10 mg total) by mouth daily. 07/22/16   Alford Highland, MD  midodrine (PROAMATINE) 10 MG tablet Take 1 tablet (10 mg total) by mouth 3 (three) times daily with meals. 03/24/17   Houston Siren, MD  multivitamin (RENA-VIT) TABS tablet Take 1 tablet by mouth at bedtime. 01/19/17   Enedina Finner, MD  pantoprazole (PROTONIX) 40 MG tablet Take 1 tablet (40 mg total) by mouth daily. 09/25/16   Shaune Pollack, MD  sertraline (ZOLOFT) 50 MG tablet Take 50 mg by mouth daily.    [provider]  zolpidem (AMBIEN) 5 MG tablet Take 5 mg by mouth at bedtime as needed for sleep.  11/14/16   [provider]    Allergies Sulfur  Family History  Problem Relation Age of Onset  . Coronary artery disease Father   . Pancreatic cancer Mother   . Breast cancer Sister   . Lung cancer Brother   . Cervical cancer Sister     Social History Social History  Substance Use Topics  . Smoking status: Never Smoker  . Smokeless tobacco: Never Used  . Alcohol use No    Review of Systems Constitutional: Fatigue Eyes: No visual changes. ENT: No sore throat. Cardiovascular: Denies chest  pain. Respiratory: See history of present illness Gastrointestinal: No new abdominal pain.  No nausea, no vomiting.  No diarrhea.  No constipation. Genitourinary: Negative for dysuria. Musculoskeletal: Negative for back pain. Skin: Negative for rash. Neurological: Negative for headaches, focal weakness or numbness.    ____________________________________________   PHYSICAL EXAM:  VITAL SIGNS: ED Triage Vitals  Enc Vitals Group     BP 04/03/17 2126 119/66     Pulse Rate 04/03/17 2126 (!) 122     Resp 04/03/17 2126 (!) 22     Temp 04/03/17 2126 100 F (37.8 C)     Temp Source 04/03/17 2126 Oral     SpO2 04/03/17 2126 96 %     Weight 04/03/17 2125 178 lb (80.7 kg)     Height 04/03/17 2125 6\' 2"  (1.88 m)     Head Circumference --      Peak Flow --      Pain Score 04/03/17 2125 10     Pain Loc --      Pain Edu? --      Excl. in GC? --  Constitutional: Alert and oriented. Chronically ill appearance but in no distress. Eyes: Conjunctivae are normal. Head: Atraumatic. Nose: No congestion/rhinnorhea. Mouth/Throat: Mucous membranes are moist. Neck: No stridor.   Cardiovascular: Tachycardiac occasionally irregular Grossly normal heart sounds.  Good peripheral circulation. Respiratory: Mild increased work of breathing with mild use of accessory muscles and minimal tachypnea. Left lung appears and sounds quite clear, the right lung is diminished from about the mid thorax down. Clean and dry vascular access catheter in the left upper chest Gastrointestinal: Soft and nontender. No distention. Bandage clean dry intact over the left side of the abdomen from recent paracentesis Musculoskeletal: No lower extremity tenderness nor edema. Neurologic:  Normal speech and language. No gross focal neurologic deficits are appreciated.  Skin:  Skin is warm, dry and intact. No rash noted. Psychiatric: Mood and affect are normal. Speech and behavior are  normal.  ____________________________________________   LABS (all labs ordered are listed, but only abnormal results are displayed)  Labs Reviewed  COMPREHENSIVE METABOLIC PANEL - Abnormal; Notable for the following:       Result Value   Chloride 98 (*)    Glucose, Bld 153 (*)    BUN 35 (*)    Creatinine, Ser 2.47 (*)    Calcium 8.3 (*)    Albumin 2.6 (*)    ALT 14 (*)    GFR calc non Af Amer 29 (*)    GFR calc Af Amer 34 (*)    All other components within normal limits  CBC WITH DIFFERENTIAL/PLATELET - Abnormal; Notable for the following:    RBC 3.29 (*)    Hemoglobin 8.8 (*)    HCT 28.0 (*)    MCHC 31.5 (*)    RDW 20.6 (*)    All other components within normal limits  PROTIME-INR - Abnormal; Notable for the following:    Prothrombin Time 16.6 (*)    All other components within normal limits  CULTURE, BLOOD (ROUTINE X 2)  CULTURE, BLOOD (ROUTINE X 2)  LACTIC ACID, PLASMA  URINALYSIS, ROUTINE W REFLEX MICROSCOPIC  LACTIC ACID, PLASMA  AMMONIA   ____________________________________________  EKG  Reviewed and interpreted by me at 2140 Heart rate 1:15 Triage 170 QTc 550 Somewhat indeterminate atrial rhythm, possible atrial ectopy or flutter (seems less likely), mild tachycardia, RSR prime pattern noted, compared with previous does not appear significant change ____________________________________________  RADIOLOGY  [place] Paracentesis  Result Date: 04/03/2017 INDICATION: End-stage renal disease, CHF, recurrent pleural effusions and ascites. Request received for therapeutic paracentesis. EXAM: ULTRASOUND GUIDED THERAPEUTIC PARACENTESIS MEDICATIONS: None. COMPLICATIONS: None immediate. PROCEDURE: Informed written consent was obtained from the patient after a discussion of the risks, benefits and alternatives to treatment. A timeout was performed prior to the initiation of the procedure. Initial ultrasound scanning demonstrates a small amount of ascites within the left lower  abdominal quadrant. The left lower abdomen was prepped and draped in the usual sterile fashion. 1% lidocaine was used for local anesthesia. Following this, a Yueh catheter was introduced. An ultrasound image was saved for documentation purposes. The paracentesis was performed. The catheter was removed and a dressing was applied. The patient tolerated the procedure well without immediate post procedural complication. FINDINGS: A total of approximately 850 cc of clear, yellow fluid was removed. IMPRESSION: Successful ultrasound-guided therapeutic paracentesis yielding 850 cc of peritoneal fluid. Read by: Jeananne RamaKevin Allred, PA-C Electronically Signed   By: Corlis Leak  Hassell M.D.   On: 04/03/2017 10:12   Dg Chest Port 1 View  Result Date: 04/03/2017  CLINICAL DATA:  Acute onset of shortness of breath and cough. Initial encounter. EXAM: PORTABLE CHEST 1 VIEW COMPARISON:  Chest radiograph performed 02/08/2017 FINDINGS: A moderate right-sided pleural effusion is noted. Right basilar airspace opacification raises concern for pneumonia. Underlying vascular congestion is noted, and interstitial edema might have a similar appearance. A small left pleural effusion is noted. No pneumothorax is seen. The cardiomediastinal silhouette is borderline normal in size. A left-sided dual-lumen catheter is noted about the distal SVC. No acute osseous abnormalities are seen. IMPRESSION: Moderate right-sided pleural effusion. Right basilar airspace opacification raises concern for pneumonia. Underlying vascular congestion noted; interstitial edema might have a similar appearance. Small left pleural effusion noted. Electronically Signed   By: Roanna Raider M.D.   On: 04/03/2017 22:00    ____________________________________________   PROCEDURES  Procedure(s) performed: None  Procedures  Critical Care performed: No  ____________________________________________   INITIAL IMPRESSION / ASSESSMENT AND PLAN / ED COURSE  Pertinent labs &  imaging results that were available during my care of the patient were reviewed by me and considered in my medical decision making (see chart for details).  Patient presents for complaints of increased cough, shortness of breath, noted to have low-grade fever.  He is currently on prophylactic antibiotics due to concerns for SBP, had a paracentesis today but denies new or concerning abdominal symptoms.  ----------------------------------------- 10:37 PM on 04/03/2017 -----------------------------------------  Chest x-ray concerning for possible consolidation associated with pleural effusion. Suspect he may have developed healthcare associated pneumonia. Cover with antibiotics, admit to inpatient for ongoing care. He does meet sepsis criteria with tachycardia, mild tachypnea.      ____________________________________________   FINAL CLINICAL IMPRESSION(S) / ED DIAGNOSES  Final diagnoses:  Sepsis, due to unspecified organism (HCC)  HCAP (healthcare-associated pneumonia)      NEW MEDICATIONS STARTED DURING THIS VISIT:  New Prescriptions   No medications on file     Note:  This document was prepared using Dragon voice recognition software and may include unintentional dictation errors.     Sharyn Creamer, MD 04/03/17 2240

## 2017-04-03 NOTE — H&P (Signed)
[facility] - [place] at [facility]   PATIENT NAME: Juan Roberson    MR#:  [id number]  DATE OF BIRTH:  [birth year]  DATE OF ADMISSION:  04/03/2017  PRIMARY CARE PHYSICIAN: Corky Downs, MD   REQUESTING/REFERRING PHYSICIAN: Fanny Bien, MD  CHIEF COMPLAINT:   Chief Complaint  Patient presents with  . Shortness of Breath    HISTORY OF PRESENT ILLNESS:  Juan Roberson  is a 46 y.o. male who presents with Cough, shortness of breath. Patient states she's had these symptoms for last several days, they've been getting progressively worse. He was recently here in the hospital. On examination the ED today he is found to have opacity on chest x-ray suspicious for pneumonia. Hospitalists were called for admission.  PAST MEDICAL HISTORY:   Past Medical History:  Diagnosis Date  . Cellulitis and abscess of foot    Left-Dr. Ether Griffins  . CHF (congestive heart failure) (HCC)    EF 20%  . CKD (chronic kidney disease), stage III   . Diabetes mellitus without complication (HCC)    a. Dx ~ 1996.  [name] Dysrhythmia   . Essential hypertension   . Gastritis    a. 04/2015 hematemesis -> EGD: gastritis, esophagitis, duodenitis.  No active bleeding.  PPI added.  [name] GERD (gastroesophageal reflux disease)   . Left leg DVT (HCC)    a. Dx 05/2015 -> Coumadin.  . MI (myocardial infarction) (HCC)   . Osteomyelitis (HCC)    a. 05/2015 L foot.    PAST SURGICAL HISTORY:   Past Surgical History:  Procedure Laterality Date  . CARDIAC CATHETERIZATION Right 10/26/2016   Procedure: Left Heart Cath and Coronary Angiography;  Surgeon: Laurier Nancy, MD;  Location: [facility] INVASIVE CV LAB;  Service: Cardiovascular;  Laterality: Right;  . DIALYSIS/PERMA CATHETER INSERTION N/A 01/17/2017   Procedure: Dialysis/Perma Catheter Insertion;  Surgeon: Annice Needy, MD;  Location: [facility] INVASIVE CV LAB;  Service: Cardiovascular;  Laterality: N/A;  . DIALYSIS/PERMA CATHETER REMOVAL N/A 12/05/2016   Procedure:  Dialysis/Perma Catheter Removal;  Surgeon: Renford Dills, MD;  Location: [facility] INVASIVE CV LAB;  Service: Cardiovascular;  Laterality: N/A;  . ESOPHAGOGASTRODUODENOSCOPY N/A 04/07/2015   Procedure: ESOPHAGOGASTRODUODENOSCOPY (EGD);  Surgeon: Midge Minium, MD;  Location: [facility] ENDOSCOPY;  Service: Endoscopy;  Laterality: N/A;  . ESOPHAGOGASTRODUODENOSCOPY (EGD) WITH PROPOFOL Left 06/30/2015   Procedure: ESOPHAGOGASTRODUODENOSCOPY (EGD) WITH PROPOFOL;  Surgeon: Christena Deem, MD;  Location: [facility] ENDOSCOPY;  Service: Endoscopy;  Laterality: Left;  . FOOT SURGERY    . I&D EXTREMITY Left 04/06/2015   Procedure: IRRIGATION AND DEBRIDEMENT EXTREMITY;  Surgeon: Gwyneth Revels, DPM;  Location: [facility] ORS;  Service: Podiatry;  Laterality: Left;  . IRRIGATION AND DEBRIDEMENT FOOT Left 05/21/2015   Procedure: IRRIGATION AND DEBRIDEMENT FOOT;  Surgeon: Linus Galas, MD;  Location: [facility] ORS;  Service: Podiatry;  Laterality: Left;  . IRRIGATION AND DEBRIDEMENT KNEE Right 10/18/2016   Procedure: IRRIGATION AND DEBRIDEMENT KNEE;  Surgeon: Deeann Saint, MD;  Location: [facility] ORS;  Service: Orthopedics;  Laterality: Right;  . KNEE ARTHROSCOPY  10/18/2016   Procedure: ARTHROSCOPY KNEE;  Surgeon: Deeann Saint, MD;  Location: [facility] ORS;  Service: Orthopedics;;  . PERIPHERAL VASCULAR CATHETERIZATION N/A 10/27/2016   Procedure: Dialysis/Perma Catheter Insertion;  Surgeon: Annice Needy, MD;  Location: [facility] INVASIVE CV LAB;  Service: Cardiovascular;  Laterality: N/A;    SOCIAL HISTORY:   Social History  Substance Use Topics  . Smoking status: Never Smoker  . Smokeless tobacco: Never Used  . Alcohol  use No    FAMILY HISTORY:   Family History  Problem Relation Age of Onset  . Coronary artery disease Father   . Pancreatic cancer Mother   . Breast cancer Sister   . Lung cancer Brother   . Cervical cancer Sister     DRUG ALLERGIES:   Allergies  Allergen Reactions  . Sulfur Other (See Comments)    Pt states that  this medication causes renal failure.      MEDICATIONS AT HOME:   Prior to Admission medications   Medication Sig Start Date End Date Taking? Authorizing Provider  amiodarone (PACERONE) 200 MG tablet Take 1 tablet (200 mg total) by mouth daily. 09/25/16   Shaune Pollack, MD  aspirin EC 81 MG EC tablet Take 1 tablet (81 mg total) by mouth daily. 07/23/16   Alford Highland, MD  cefpodoxime Varney Baas) 200 MG tablet Take 1 tablet (200 mg total) by mouth every Tuesday, Thursday, and Saturday at 6 PM. Take in the evening after dialysis- life long 03/10/17   Enid Baas, MD  fluticasone furoate-vilanterol (BREO ELLIPTA) 100-25 MCG/INH AEPB Inhale 1 puff into the lungs daily.    [provider]  insulin aspart (NOVOLOG) 100 UNIT/ML injection Inject 4 Units into the skin 3 (three) times daily with meals. 10/31/16   Enid Baas, MD  methimazole (TAPAZOLE) 10 MG tablet Take 1 tablet (10 mg total) by mouth daily. 07/22/16   Alford Highland, MD  midodrine (PROAMATINE) 10 MG tablet Take 1 tablet (10 mg total) by mouth 3 (three) times daily with meals. 03/24/17   Houston Siren, MD  multivitamin (RENA-VIT) TABS tablet Take 1 tablet by mouth at bedtime. 01/19/17   Enedina Finner, MD  pantoprazole (PROTONIX) 40 MG tablet Take 1 tablet (40 mg total) by mouth daily. 09/25/16   Shaune Pollack, MD  sertraline (ZOLOFT) 50 MG tablet Take 50 mg by mouth daily.    [provider]  zolpidem (AMBIEN) 5 MG tablet Take 5 mg by mouth at bedtime as needed for sleep.  11/14/16   [provider]    REVIEW OF SYSTEMS:  Review of Systems  Constitutional: Negative for chills, fever, malaise/fatigue and weight loss.  HENT: Negative for ear pain, hearing loss and tinnitus.   Eyes: Negative for blurred vision, double vision, pain and redness.  Respiratory: Positive for cough, sputum production and shortness of breath. Negative for hemoptysis.   Cardiovascular: Negative for chest pain, palpitations,  orthopnea and leg swelling.  Gastrointestinal: Negative for abdominal pain, constipation, diarrhea, nausea and vomiting.  Genitourinary: Negative for dysuria, frequency and hematuria.  Musculoskeletal: Negative for back pain, joint pain and neck pain.  Skin:       No acne, rash, or lesions  Neurological: Negative for dizziness, tremors, focal weakness and weakness.  Endo/Heme/Allergies: Negative for polydipsia. Does not bruise/bleed easily.  Psychiatric/Behavioral: Negative for depression. The patient is not nervous/anxious and does not have insomnia.      VITAL SIGNS:   Vitals:   04/03/17 2125 04/03/17 2126  BP:  119/66  Pulse:  (!) 122  Resp:  (!) 22  Temp:  100 F (37.8 C)  TempSrc:  Oral  SpO2:  96%  Weight: 80.7 kg (178 lb)   Height: 6\' 2"  (1.88 m)    Wt Readings from Last 3 Encounters:  04/03/17 80.7 kg (178 lb)  03/24/17 79.6 kg (175 lb 7.8 oz)  03/20/17 73.9 kg (163 lb)    PHYSICAL EXAMINATION:  Physical Exam  Vitals reviewed.  Constitutional: He is oriented to person, place, and time. He appears well-developed and well-nourished. No distress.  HENT:  Head: Normocephalic and atraumatic.  Mouth/Throat: Oropharynx is clear and moist.  Eyes: Conjunctivae and EOM are normal. Pupils are equal, round, and reactive to light. No scleral icterus.  Neck: Normal range of motion. Neck supple. No JVD present. No thyromegaly present.  Cardiovascular: Normal rate, regular rhythm and intact distal pulses.  Exam reveals no gallop and no friction rub.   No murmur heard. Respiratory: Effort normal and breath sounds normal. No respiratory distress. He has no wheezes. He has no rales.  ronchi  GI: Soft. Bowel sounds are normal. He exhibits no distension. There is no tenderness.  Musculoskeletal: Normal range of motion. He exhibits no edema.  No arthritis, no gout  Lymphadenopathy:    He has no cervical adenopathy.  Neurological: He is alert and oriented to person, place, and time.  No cranial nerve deficit.  No dysarthria, no aphasia  Skin: Skin is warm and dry. No rash noted. No erythema.  Psychiatric: He has a normal mood and affect. His behavior is normal. Judgment and thought content normal.    LABORATORY PANEL:   CBC  Recent Labs Lab 04/03/17 2148  WBC 5.6  HGB 8.8*  HCT 28.0*  PLT 240   ------------------------------------------------------------------------------------------------------------------  Chemistries   Recent Labs Lab 04/03/17 2148  NA 137  K 3.7  CL 98*  CO2 28  GLUCOSE 153*  BUN 35*  CREATININE 2.47*  CALCIUM 8.3*  AST 23  ALT 14*  ALKPHOS 88  BILITOT 1.1   ------------------------------------------------------------------------------------------------------------------  Cardiac Enzymes No results for input(s): TROPONINI in the last 168 hours. ------------------------------------------------------------------------------------------------------------------  RADIOLOGY:  US Paracentesis  Result Date: 04/03/2017 INDICATION: End-stage renal disease, CHF, recurrent pleural effusions and ascites. Request received for therapeutic paracentesis. EXAM: ULTRASOUND GUIDED THERAPEUTIC PARACENTESIS MEDICATIONS: None. COMPLICATIONS: None immediate. PROCEDURE: Informed written consent was obtained from the patient after a discussion of the risks, benefits and alternatives to treatment. A timeout was performed prior to the initiation of the procedure. Initial ultrasound scanning demonstrates a small amount of ascites within the left lower abdominal quadrant. The left lower abdomen was prepped and draped in the usual sterile fashion. 1% lidocaine was used for local anesthesia. Following this, a Yueh catheter was introduced. An ultrasound image was saved for documentation purposes. The paracentesis was performed. The catheter was removed and a dressing was applied. The patient tolerated the procedure well without immediate post procedural  complication. FINDINGS: A total of approximately 850 cc of clear, yellow fluid was removed. IMPRESSION: Successful ultrasound-guided therapeutic paracentesis yielding 850 cc of peritoneal fluid. Read by: Jeananne Rama, PA-C Electronically Signed   By: Corlis Leak M.D.   On: 04/03/2017 10:12   Dg Chest Port 1 View  Result Date: 04/03/2017 CLINICAL DATA:  Acute onset of shortness of breath and cough. Initial encounter. EXAM: PORTABLE CHEST 1 VIEW COMPARISON:  Chest radiograph performed 02/08/2017 FINDINGS: A moderate right-sided pleural effusion is noted. Right basilar airspace opacification raises concern for pneumonia. Underlying vascular congestion is noted, and interstitial edema might have a similar appearance. A small left pleural effusion is noted. No pneumothorax is seen. The cardiomediastinal silhouette is borderline normal in size. A left-sided dual-lumen catheter is noted about the distal SVC. No acute osseous abnormalities are seen. IMPRESSION: Moderate right-sided pleural effusion. Right basilar airspace opacification raises concern for pneumonia. Underlying vascular congestion noted; interstitial edema might have a similar appearance. Small left pleural effusion noted.  Electronically Signed   By: Roanna RaiderJeffery  Chang M.D.   On: 04/03/2017 22:00    EKG:   Orders placed or performed during the hospital encounter of 04/03/17  . ED EKG 12-Lead  . ED EKG 12-Lead    IMPRESSION AND PLAN:  Principal Problem:   Sepsis (HCC) - lactic acid was within normal limits, blood pressure borderline low, IV fluids for support, IV antibiotics started, cultures sent from the ED. Active Problems:   HCAP (healthcare-associated pneumonia) - IV antibiotics, other supportive treatments including duo nebs and when necessary antitussive   Chronic combined systolic and diastolic CHF (congestive heart failure) (HCC) - continue home meds   HTN (hypertension) - hold home antihypertensives as patient's blood pressure is  borderline low   CKD (chronic kidney disease), stage III - stable, monitor, avoid nephrotoxins   GERD (gastroesophageal reflux disease) - home dose PPI  All the records are reviewed and case discussed with ED provider. Management plans discussed with the patient and/or family.  DVT PROPHYLAXIS: SubQ heparin  GI PROPHYLAXIS: PPI  ADMISSION STATUS: Inpatient  CODE STATUS: Full Code Status History    Date Active Date Inactive Code Status Order ID Comments User Context   03/21/2017  4:43 PM 03/25/2017  3:15 PM Full Code [id number]  Enid BaasKalisetti, Radhika, MD Inpatient   03/07/2017 11:09 PM 03/09/2017  6:50 PM Full Code [id number]  Hugelmeyer, Alexis, DO Inpatient   02/06/2017  1:55 PM 02/09/2017  6:54 PM Full Code [id number]  Houston SirenSainani, Vivek J, MD ED   01/25/2017  1:08 PM 01/27/2017  6:10 PM Full Code [id number]  Houston SirenSainani, Vivek J, MD Inpatient   01/14/2017  6:18 AM 01/19/2017  9:31 PM Full Code [id number]  Ihor AustinPyreddy, Pavan, MD Inpatient   12/05/2016  8:01 PM 12/08/2016  3:11 PM Full Code [id number]  Altamese DillingVachhani, Vaibhavkumar, MD Inpatient   10/28/2016  8:42 PM 10/31/2016  8:35 PM Full Code [id number]  Houston SirenSainani, Vivek J, MD Inpatient   10/17/2016  8:18 PM 10/27/2016  6:48 PM Full Code [id number]  Auburn BilberryPatel, Shreyang, MD Inpatient   09/24/2016  3:36 PM 09/25/2016  2:15 PM Full Code [id number]  Wyatt HasteHower, Arnesha Schiraldi K, MD ED   07/21/2016 11:05 AM 07/22/2016  3:11 PM Full Code [id number]  Alford HighlandWieting, Richard, MD ED   02/28/2016  1:43 AM 03/01/2016  6:06 PM Full Code [id number]  Arnaldo Nataliamond, Michael S, MD Inpatient   12/06/2015  8:39 PM 12/08/2015  2:51 PM Full Code [id number]  Altamese DillingVachhani, Vaibhavkumar, MD Inpatient   11/01/2015  4:00 PM 11/03/2015  2:28 PM Full Code [id number]  Hower, Cletis Athensavid K, MD ED   06/29/2015  2:30 AM 07/01/2015  2:21 PM Full Code [id number]  Arnaldo Nataliamond, Michael S, MD Inpatient   05/21/2015  4:34 PM 05/25/2015  4:34 PM Full Code [id number]  Linus Galasline, Todd, MD Inpatient   05/18/2015  9:17 PM 05/21/2015  4:34 PM Full Code [id number]  Shaune Pollackhen, Qing, MD Inpatient    04/05/2015  7:05 AM 04/08/2015  2:00 PM Full Code [id number]  Arnaldo Nataliamond, Michael S, MD Inpatient      TOTAL TIME TAKING CARE OF THIS PATIENT: 45 minutes.   Atwell Mcdanel FIELDING 04/03/2017, 11:08 PM  [facility]  Office  [phone]  CC: Primary care physician; Corky DownsMasoud, Javed, MD  Note:  This document was prepared using Dragon voice recognition software and may include unintentional dictation errors.

## 2017-04-03 NOTE — ED Triage Notes (Signed)
pt to triage via wheelchair.  pt has sob since yesterday.  No chest pain.  Pt had paracentesis this week.  Pt alert.

## 2017-04-03 NOTE — ED Notes (Signed)
CODE  SEPSIS  CALLED  TO  [facility] 

## 2017-04-03 NOTE — Procedures (Signed)
Ultrasound-guided  therapeutic paracentesis performed yielding 850 cc of clear ,yellow fluid. No immediate complications.

## 2017-04-04 DIAGNOSIS — L8990 Pressure ulcer of unspecified site, unspecified stage: Secondary | ICD-10-CM | POA: Insufficient documentation

## 2017-04-04 LAB — BLOOD CULTURE ID PANEL (REFLEXED)
ACINETOBACTER BAUMANNII: NOT DETECTED
CANDIDA ALBICANS: NOT DETECTED
CANDIDA KRUSEI: NOT DETECTED
CANDIDA PARAPSILOSIS: NOT DETECTED
Candida glabrata: NOT DETECTED
Candida tropicalis: NOT DETECTED
ENTEROBACTERIACEAE SPECIES: NOT DETECTED
ENTEROCOCCUS SPECIES: NOT DETECTED
ESCHERICHIA COLI: NOT DETECTED
Enterobacter cloacae complex: NOT DETECTED
HAEMOPHILUS INFLUENZAE: NOT DETECTED
KLEBSIELLA OXYTOCA: NOT DETECTED
Klebsiella pneumoniae: NOT DETECTED
LISTERIA MONOCYTOGENES: NOT DETECTED
METHICILLIN RESISTANCE: DETECTED — AB
Neisseria meningitidis: NOT DETECTED
PSEUDOMONAS AERUGINOSA: NOT DETECTED
Proteus species: NOT DETECTED
STAPHYLOCOCCUS AUREUS BCID: NOT DETECTED
STREPTOCOCCUS AGALACTIAE: NOT DETECTED
STREPTOCOCCUS PNEUMONIAE: NOT DETECTED
STREPTOCOCCUS PYOGENES: NOT DETECTED
Serratia marcescens: NOT DETECTED
Staphylococcus species: DETECTED — AB
Streptococcus species: DETECTED — AB

## 2017-04-04 LAB — URINALYSIS, ROUTINE W REFLEX MICROSCOPIC
Glucose, UA: 50 mg/dL — AB
Hgb urine dipstick: NEGATIVE
Ketones, ur: NEGATIVE mg/dL
Nitrite: NEGATIVE
Protein, ur: 100 mg/dL — AB
Specific Gravity, Urine: 1.02 (ref 1.005–1.030)
pH: 5 (ref 5.0–8.0)

## 2017-04-04 LAB — BASIC METABOLIC PANEL
ANION GAP: 5 (ref 5–15)
BUN: 41 mg/dL — ABNORMAL HIGH (ref 6–20)
CALCIUM: 8.3 mg/dL — AB (ref 8.9–10.3)
CHLORIDE: 98 mmol/L — AB (ref 101–111)
CO2: 30 mmol/L (ref 22–32)
CREATININE: 2.74 mg/dL — AB (ref 0.61–1.24)
GFR calc non Af Amer: 26 mL/min — ABNORMAL LOW (ref >60)
GFR, EST AFRICAN AMERICAN: 30 mL/min — AB (ref >60)
Glucose, Bld: 196 mg/dL — ABNORMAL HIGH (ref 65–99)
Potassium: 3.9 mmol/L (ref 3.5–5.1)
SODIUM: 133 mmol/L — AB (ref 135–145)

## 2017-04-04 LAB — CBC
HCT: 28.2 % — ABNORMAL LOW (ref 40.0–52.0)
Hemoglobin: 8.8 g/dL — ABNORMAL LOW (ref 13.0–18.0)
MCH: 26.3 pg (ref 26.0–34.0)
MCHC: 31.2 g/dL — ABNORMAL LOW (ref 32.0–36.0)
MCV: 84.4 fL (ref 80.0–100.0)
PLATELETS: 214 10*3/uL (ref 150–440)
RBC: 3.34 MIL/uL — AB (ref 4.40–5.90)
RDW: 20.1 % — ABNORMAL HIGH (ref 11.5–14.5)
WBC: 5.8 10*3/uL (ref 3.8–10.6)

## 2017-04-04 LAB — GLUCOSE, CAPILLARY
Glucose-Capillary: 163 mg/dL — ABNORMAL HIGH (ref 65–99)
Glucose-Capillary: 148 mg/dL — ABNORMAL HIGH (ref 65–99)

## 2017-04-04 LAB — AMMONIA: Ammonia: 34 umol/L (ref 9–35)

## 2017-04-04 MED ORDER — BENZONATATE 100 MG PO CAPS
200.0000 mg | ORAL_CAPSULE | Freq: Three times a day (TID) | ORAL | Status: DC | PRN
  Administered 2017-04-04: 200 mg via ORAL
  Filled 2017-04-04: qty 2

## 2017-04-04 MED ORDER — OXYCODONE HCL 5 MG PO TABS
5.0000 mg | ORAL_TABLET | Freq: Four times a day (QID) | ORAL | Status: DC | PRN
  Filled 2017-04-04: qty 1

## 2017-04-04 MED ORDER — VANCOMYCIN HCL IN DEXTROSE 750-5 MG/150ML-% IV SOLN: 750.0000 mg | INTRAVENOUS | Status: DC

## 2017-04-04 MED ORDER — ZOLPIDEM TARTRATE 5 MG PO TABS
5.0000 mg | ORAL_TABLET | Freq: Every evening | ORAL | Status: DC | PRN
  Administered 2017-04-04: 5 mg via ORAL
  Filled 2017-04-04: qty 1

## 2017-04-04 MED ORDER — IPRATROPIUM-ALBUTEROL 0.5-2.5 (3) MG/3ML IN SOLN: 3.0000 mL | RESPIRATORY_TRACT | Status: DC | PRN

## 2017-04-04 MED ORDER — ONDANSETRON HCL 4 MG/2ML IJ SOLN
4.0000 mg | Freq: Four times a day (QID) | INTRAMUSCULAR | Status: DC | PRN
  Administered 2017-04-04 – 2017-04-05 (×3): 4 mg via INTRAVENOUS
  Filled 2017-04-04 (×3): qty 2

## 2017-04-04 MED ORDER — OXYCODONE HCL 5 MG PO TABS
5.0000 mg | ORAL_TABLET | Freq: Four times a day (QID) | ORAL | Status: DC | PRN
  Administered 2017-04-04 (×3): 5 mg via ORAL
  Filled 2017-04-04 (×3): qty 1

## 2017-04-04 MED ORDER — ASPIRIN EC 81 MG PO TBEC
81.0000 mg | DELAYED_RELEASE_TABLET | Freq: Every day | ORAL | Status: DC
  Administered 2017-04-04: 81 mg via ORAL
  Filled 2017-04-04: qty 1

## 2017-04-04 MED ORDER — MORPHINE SULFATE (PF) 2 MG/ML IV SOLN
2.0000 mg | Freq: Once | INTRAVENOUS | Status: AC
Start: 2017-04-04 — End: 2017-04-04
  Administered 2017-04-04: 2 mg via INTRAVENOUS
  Filled 2017-04-04: qty 1

## 2017-04-04 MED ORDER — FLUTICASONE FUROATE-VILANTEROL 100-25 MCG/INH IN AEPB
1.0000 | INHALATION_SPRAY | Freq: Every day | RESPIRATORY_TRACT | Status: DC
  Administered 2017-04-04: 11:00:00 1 via RESPIRATORY_TRACT
  Filled 2017-04-04: qty 28

## 2017-04-04 MED ORDER — MIDODRINE HCL 5 MG PO TABS
10.0000 mg | ORAL_TABLET | Freq: Three times a day (TID) | ORAL | Status: DC
  Administered 2017-04-04 – 2017-04-05 (×2): 10 mg via ORAL
  Filled 2017-04-04 (×4): qty 2

## 2017-04-04 MED ORDER — DEXTROSE 5 % IV SOLN
2.0000 g | INTRAVENOUS | Status: DC
  Filled 2017-04-04: qty 2

## 2017-04-04 MED ORDER — VANCOMYCIN HCL 10 G IV SOLR
1500.0000 mg | INTRAVENOUS | Status: DC
  Administered 2017-04-04: 1500 mg via INTRAVENOUS
  Filled 2017-04-04: qty 1500

## 2017-04-04 MED ORDER — METHIMAZOLE 10 MG PO TABS
10.0000 mg | ORAL_TABLET | Freq: Every day | ORAL | Status: DC
  Administered 2017-04-04: 10 mg via ORAL
  Filled 2017-04-04 (×2): qty 1

## 2017-04-04 MED ORDER — DEXTROSE 5 % IV SOLN: 2.0000 g | INTRAVENOUS | Status: DC

## 2017-04-04 MED ORDER — AMIODARONE HCL 200 MG PO TABS
200.0000 mg | ORAL_TABLET | Freq: Every day | ORAL | Status: DC
  Administered 2017-04-04: 200 mg via ORAL
  Filled 2017-04-04: qty 1

## 2017-04-04 MED ORDER — MORPHINE SULFATE (PF) 2 MG/ML IV SOLN
1.0000 mg | Freq: Four times a day (QID) | INTRAVENOUS | Status: DC | PRN
  Administered 2017-04-04 – 2017-04-05 (×2): 1 mg via INTRAVENOUS
  Filled 2017-04-04 (×2): qty 1

## 2017-04-04 MED ORDER — HEPARIN SODIUM (PORCINE) 5000 UNIT/ML IJ SOLN
5000.0000 [IU] | Freq: Three times a day (TID) | INTRAMUSCULAR | Status: DC
  Administered 2017-04-04 – 2017-04-05 (×3): 5000 [IU] via SUBCUTANEOUS
  Filled 2017-04-04 (×3): qty 1

## 2017-04-04 MED ORDER — DIPHENHYDRAMINE HCL 25 MG PO CAPS
25.0000 mg | ORAL_CAPSULE | Freq: Four times a day (QID) | ORAL | Status: DC | PRN
  Administered 2017-04-04 (×3): 25 mg via ORAL
  Filled 2017-04-04 (×5): qty 1

## 2017-04-04 MED ORDER — SERTRALINE HCL 50 MG PO TABS
50.0000 mg | ORAL_TABLET | Freq: Every day | ORAL | Status: DC
  Administered 2017-04-04: 50 mg via ORAL
  Filled 2017-04-04: qty 1

## 2017-04-04 MED ORDER — ONDANSETRON HCL 4 MG PO TABS: 4.0000 mg | ORAL_TABLET | Freq: Four times a day (QID) | ORAL | Status: DC | PRN

## 2017-04-04 MED ORDER — PANTOPRAZOLE SODIUM 40 MG PO TBEC
40.0000 mg | DELAYED_RELEASE_TABLET | Freq: Every day | ORAL | Status: DC
  Administered 2017-04-04: 40 mg via ORAL
  Filled 2017-04-04: qty 1

## 2017-04-04 MED ORDER — SODIUM CHLORIDE 0.9 % IV SOLN: Freq: Once | INTRAVENOUS | Status: DC

## 2017-04-04 MED ORDER — ACETAMINOPHEN 325 MG PO TABS
650.0000 mg | ORAL_TABLET | Freq: Once | ORAL | Status: AC
  Administered 2017-04-04: 650 mg via ORAL
  Filled 2017-04-04: qty 2

## 2017-04-04 MED ORDER — GUAIFENESIN-DM 100-10 MG/5ML PO SYRP
5.0000 mL | ORAL_SOLUTION | ORAL | Status: DC | PRN
  Administered 2017-04-04: 5 mL via ORAL
  Filled 2017-04-04: qty 5

## 2017-04-04 NOTE — Progress Notes (Signed)
Pharmacy Antibiotic Note  Juan BlossomWaltzie L Requena Jr. is a 46 y.o. male admitted on 04/03/2017 with sepsis.  Pharmacy has been consulted for vanc/cefepime dosing. Patient has ESRD on HD TThSa.  Plan: Will check a random vancomycin level prior to dialysis tomorrow and tentatively plan on dosing 750 mg iv q HD starting after dialysis tomorrow.   Will change cefepime dosing to 2 g IV q HD.   Height: 6\' 2"  (188 cm) Weight: 168 lb 3.2 oz (76.3 kg) IBW/kg (Calculated) : 82.2  Temp (24hrs), Avg:98.6 F (37 C), Min:97.8 F (36.6 C), Max:100 F (37.8 C)   Recent Labs Lab 04/03/17 2148 04/04/17 0530  WBC 5.6 5.8  CREATININE 2.47* 2.74*  LATICACIDVEN 1.8  --     Estimated Creatinine Clearance: 35.6 mL/min (A) (by C-G formula based on SCr of 2.74 mg/dL (H)).    Allergies  Allergen Reactions  . Sulfur Other (See Comments)    Pt states that this medication causes renal failure.    . Hydrocodone Itching  . Oxycodone Itching  . Tramadol Itching     Thank you for allowing pharmacy to be a part of this patient's care.  Luisa HartScott Jazira Maloney, PharmD Clinical Pharmacist t 04/04/2017

## 2017-04-04 NOTE — ED Notes (Signed)
Pt's O2 sats noted to be at 87/89% on RA; pt put on 2L via [place]; pt's O2 sats improved to 95%

## 2017-04-04 NOTE — Plan of Care (Signed)
Problem: Fluid Volume: Goal: Ability to maintain a balanced intake and output will improve Outcome: Not Progressing Pt cont to have dyspnea on exertion.  Requiring O2 2L [place].  Unable to lie flat due to pleural effusions.

## 2017-04-04 NOTE — Progress Notes (Signed)
PHARMACY - PHYSICIAN COMMUNICATION CRITICAL VALUE ALERT - BLOOD CULTURE IDENTIFICATION (BCID)  Results for orders placed or performed during the hospital encounter of 04/03/17  Blood Culture ID Panel (Reflexed) (Collected: 04/03/2017  9:49 PM)  Result Value Ref Range   Enterococcus species NOT DETECTED NOT DETECTED   Listeria monocytogenes NOT DETECTED NOT DETECTED   Staphylococcus species DETECTED (A) NOT DETECTED   Staphylococcus aureus NOT DETECTED NOT DETECTED   Methicillin resistance DETECTED (A) NOT DETECTED   Streptococcus species DETECTED (A) NOT DETECTED   Streptococcus agalactiae NOT DETECTED NOT DETECTED   Streptococcus pneumoniae NOT DETECTED NOT DETECTED   Streptococcus pyogenes NOT DETECTED NOT DETECTED   Acinetobacter baumannii NOT DETECTED NOT DETECTED   Enterobacteriaceae species NOT DETECTED NOT DETECTED   Enterobacter cloacae complex NOT DETECTED NOT DETECTED   Escherichia coli NOT DETECTED NOT DETECTED   Klebsiella oxytoca NOT DETECTED NOT DETECTED   Klebsiella pneumoniae NOT DETECTED NOT DETECTED   Proteus species NOT DETECTED NOT DETECTED   Serratia marcescens NOT DETECTED NOT DETECTED   Haemophilus influenzae NOT DETECTED NOT DETECTED   Neisseria meningitidis NOT DETECTED NOT DETECTED   Pseudomonas aeruginosa NOT DETECTED NOT DETECTED   Candida albicans NOT DETECTED NOT DETECTED   Candida glabrata NOT DETECTED NOT DETECTED   Candida krusei NOT DETECTED NOT DETECTED   Candida parapsilosis NOT DETECTED NOT DETECTED   Candida tropicalis NOT DETECTED NOT DETECTED    Name of physician (or Provider) Contacted: Dr. Enedina FinnerSona Patel  Changes to prescribed antibiotics required: None at this time - positive blood cultures are from single set and multiple bacterial species suggests contamination. Patient received vancomycin 1.5 gm IV x 1 this AM and is on hemodialysis - vancomycin level is likely adequate and it is appropriate to defer additional antibiotics at this point.    Carola FrostNathan A Parthiv Mucci, Pharm.D., BCPS Clinical Pharmacist 04/04/2017  5:52 PM

## 2017-04-04 NOTE — ED Notes (Signed)
Pt transport to 213

## 2017-04-04 NOTE — Progress Notes (Signed)
[facility]  ROUNDING NOTE   Subjective:    [name] admitted to [facility] on 04/03/2017 for HCAP (healthcare-associated pneumonia) [J18.9] Sepsis, due to unspecified organism [facility]) [A41.9]   Patient had hemodialysis yesterday as outpatient.   Ultrasound guided paracentesis with removed.    Objective:  Vital signs in last 24 hours:  Temp:  [97.8 F (36.6 C)-100 F (37.8 C)] 97.8 F (36.6 C) (07/04 0543) Pulse Rate:  [86-122] 86 (07/04 0543) Resp:  [18-25] 18 (07/04 0543) BP: (91-129)/(60-88) 112/68 (07/04 0543) SpO2:  [89 %-100 %] 100 % (07/04 0543) Weight:  [76.3 kg (168 lb 3.2 oz)-80.7 kg (178 lb)] 76.3 kg (168 lb 3.2 oz) (07/04 0500)  Weight change:  Filed Weights   04/03/17 2125 04/04/17 0106 04/04/17 0500  Weight: 80.7 kg (178 lb) 76.3 kg (168 lb 3.2 oz) 76.3 kg (168 lb 3.2 oz)    Intake/Output: I/O last 3 completed shifts: In: 1350 [P.O.:100; IV Piggyback:1250] Out: 10 [Urine:10]   Intake/Output this shift:  No intake/output data recorded.  Physical Exam: General: No acute distress  Head: Normocephalic, atraumatic. Moist oral mucosal membranes  Eyes: Anicteric  Neck: Supple,    Lungs:  clear  Heart: S1S2 no rubs, irregular  Abdomen:  periumblical tenderness radiating to left abdomen  Extremities: no edema.  Neurologic: Awake, alert, following commands  Skin: No lesions  Access: L IJ permcath    Basic Metabolic Panel:  Recent Labs Lab 04/03/17 2148 04/04/17 0530  NA 137 133*  K 3.7 3.9  CL 98* 98*  CO2 28 30  GLUCOSE 153* 196*  BUN 35* 41*  CREATININE 2.47* 2.74*  CALCIUM 8.3* 8.3*    Liver Function Tests:  Recent Labs Lab 04/03/17 2148  AST 23  ALT 14*  ALKPHOS 88  BILITOT 1.1  PROT 7.2  ALBUMIN 2.6*   No results for input(s): LIPASE, AMYLASE in the last 168 hours.  Recent Labs Lab 04/04/17 0008  AMMONIA 34    CBC:  Recent Labs Lab 04/03/17 2148 04/04/17 0530  WBC 5.6 5.8  NEUTROABS 3.8   --   HGB 8.8* 8.8*  HCT 28.0* 28.2*  MCV 85.2 84.4  PLT 240 214    Cardiac Enzymes: No results for input(s): CKTOTAL, CKMB, CKMBINDEX, TROPONINI in the last 168 hours.  BNP: Invalid input(s): POCBNP  CBG:  Recent Labs Lab 04/04/17 0838 04/04/17 1147  GLUCAP 163* 148*    Microbiology: Results for orders placed or performed during the hospital encounter of 04/03/17  Blood Culture (routine x 2)     Status: None (Preliminary result)   Collection Time: 04/03/17  9:49 PM  Result Value Ref Range Status   Specimen Description BLOOD BLOOD LEFT HAND  Final   Special Requests   Final    BOTTLES DRAWN AEROBIC AND ANAEROBIC Blood Culture adequate volume   Culture NO GROWTH < 12 HOURS  Final   Report Status PENDING  Incomplete  Blood Culture (routine x 2)     Status: None (Preliminary result)   Collection Time: 04/03/17  9:49 PM  Result Value Ref Range Status   Specimen Description BLOOD RIGHT HAND  Final   Special Requests   Final    BOTTLES DRAWN AEROBIC AND ANAEROBIC Blood Culture adequate volume   Culture NO GROWTH < 12 HOURS  Final   Report Status PENDING  Incomplete    Coagulation Studies:  Recent Labs  04/03/17 2148  LABPROT 16.6*  INR 1.33  Urinalysis: No results for input(s): COLORURINE, LABSPEC, PHURINE, GLUCOSEU, HGBUR, BILIRUBINUR, KETONESUR, PROTEINUR, UROBILINOGEN, NITRITE, LEUKOCYTESUR in the last 72 hours.  Invalid input(s): APPERANCEUR    Imaging: [place] Paracentesis  Result Date: 04/03/2017 INDICATION: End-stage renal disease, CHF, recurrent pleural effusions and ascites. Request received for therapeutic paracentesis. EXAM: ULTRASOUND GUIDED THERAPEUTIC PARACENTESIS MEDICATIONS: None. COMPLICATIONS: None immediate. PROCEDURE: Informed written consent was obtained from the patient after a discussion of the risks, benefits and alternatives to treatment. A timeout was performed prior to the initiation of the procedure. Initial ultrasound scanning demonstrates  a small amount of ascites within the left lower abdominal quadrant. The left lower abdomen was prepped and draped in the usual sterile fashion. 1% lidocaine was used for local anesthesia. Following this, a Yueh catheter was introduced. An ultrasound image was saved for documentation purposes. The paracentesis was performed. The catheter was removed and a dressing was applied. The patient tolerated the procedure well without immediate post procedural complication. FINDINGS: A total of approximately 850 cc of clear, yellow fluid was removed. IMPRESSION: Successful ultrasound-guided therapeutic paracentesis yielding 850 cc of peritoneal fluid. Read by: Jeananne RamaKevin Allred, PA-C Electronically Signed   By: Corlis Leak  Hassell M.D.   On: 04/03/2017 10:12   Dg Chest Port 1 View  Result Date: 04/03/2017 CLINICAL DATA:  Acute onset of shortness of breath and cough. Initial encounter. EXAM: PORTABLE CHEST 1 VIEW COMPARISON:  Chest radiograph performed 02/08/2017 FINDINGS: A moderate right-sided pleural effusion is noted. Right basilar airspace opacification raises concern for pneumonia. Underlying vascular congestion is noted, and interstitial edema might have a similar appearance. A small left pleural effusion is noted. No pneumothorax is seen. The cardiomediastinal silhouette is borderline normal in size. A left-sided dual-lumen catheter is noted about the distal SVC. No acute osseous abnormalities are seen. IMPRESSION: Moderate right-sided pleural effusion. Right basilar airspace opacification raises concern for pneumonia. Underlying vascular congestion noted; interstitial edema might have a similar appearance. Small left pleural effusion noted. Electronically Signed   By: Roanna RaiderJeffery  Chang M.D.   On: 04/03/2017 22:00     Medications:   . amiodarone  200 mg Oral Daily  . aspirin EC  81 mg Oral Daily  . fluticasone furoate-vilanterol  1 puff Inhalation Daily  . heparin  5,000 Units Subcutaneous Q8H  . methimazole  10 mg Oral  Daily  . pantoprazole  40 mg Oral Daily  . sertraline  50 mg Oral Daily      Assessment/ Plan:  46 y.o.white male with End stage renal disease on hemodialysis, hypertension, diabetes mellitus type 2, peripheral neuropathy, atrial fibrillation, multiple episodes of spontaneous bacterial peritonitis, depression, hyperthyroidism, cardiac arrest, systolic congestive heart failure   [name]   1. ESRD on HD TTS: hemodialysis for tomorrow. No acute indication for dialysis today.   2. Anemia chronic kidney disease: hemoglobin 8.8 Continue EPO with HD treatment  3. Secondary hyperparathyroidism: outpatient PTH 219, phosphorus 4.5, calcium 9. At goal. Not currently on a binder.   4. Hypertension: with atrial fibrillation. Requires midodrine during dialysis treatment.  - amiodarone.  - not currently on blood pressure agents.    LOS: 1 Juan Roberson 7/4/201812:14 PM

## 2017-04-04 NOTE — Progress Notes (Signed)
[facility] [place] [facility]   PATIENT NAME: Juan Roberson    MR#:  [id number]  DATE OF BIRTH:  03/06/1971  SUBJECTIVE:   Patient here due to shortness of breath after having elective paracentesis. Noted to be hypotensive which chronic for the patient. Afebrile, hemodynamics have improved.   REVIEW OF SYSTEMS:    Review of Systems  Constitutional: Negative for chills and fever.  HENT: Negative for congestion and tinnitus.   Eyes: Negative for blurred vision and double vision.  Respiratory: Negative for cough, shortness of breath and wheezing.   Cardiovascular: Negative for chest pain, orthopnea and PND.  Gastrointestinal: Positive for abdominal pain. Negative for diarrhea, nausea and vomiting.  Genitourinary: Negative for dysuria and hematuria.  Neurological: Negative for dizziness, sensory change and focal weakness.  All other systems reviewed and are negative.   Nutrition: Heart Healthy Tolerating Diet: No Tolerating PT: Ambulatory  DRUG ALLERGIES:   Allergies  Allergen Reactions  . Sulfur Other (See Comments)    Pt states that this medication causes renal failure.    . Hydrocodone Itching  . Oxycodone Itching  . Tramadol Itching    VITALS:  Blood pressure 110/74, pulse (!) 101, temperature 98.7 F (37.1 C), temperature source Oral, resp. rate 18, height 6\' 2"  (1.88 m), weight 76.3 kg (168 lb 3.2 oz), SpO2 92 %.  PHYSICAL EXAMINATION:   Physical Exam  GENERAL:  46 y.o.-year-old patient lying in bed in no acute distress.  EYES: Pupils equal, round, reactive to light and accommodation. No scleral icterus. Extraocular muscles intact.  HEENT: Head atraumatic, normocephalic. Oropharynx and nasopharynx clear.  NECK:  Supple, no jugular venous distention. No thyroid enlargement, no tenderness.  LUNGS: Poor A/E in right mid to lower lung fileds, no wheezing, rales, rhonchi. No use of accessory muscles of respiration.  CARDIOVASCULAR: S1, S2  normal. No murmurs, rubs, or gallops.  ABDOMEN: Soft, nontender, nondistended. Bowel sounds present. No organomegaly or mass.  EXTREMITIES: No cyanosis, clubbing or edema b/l.    NEUROLOGIC: Cranial nerves II through XII are intact. No focal Motor or sensory deficits b/l.   PSYCHIATRIC: The patient is alert and oriented x 3.  SKIN: No obvious rash, lesion, or ulcer.    LABORATORY PANEL:   CBC  Recent Labs Lab 04/04/17 0530  WBC 5.8  HGB 8.8*  HCT 28.2*  PLT 214   ------------------------------------------------------------------------------------------------------------------  Chemistries   Recent Labs Lab 04/03/17 2148 04/04/17 0530  NA 137 133*  K 3.7 3.9  CL 98* 98*  CO2 28 30  GLUCOSE 153* 196*  BUN 35* 41*  CREATININE 2.47* 2.74*  CALCIUM 8.3* 8.3*  AST 23  --   ALT 14*  --   ALKPHOS 88  --   BILITOT 1.1  --    ------------------------------------------------------------------------------------------------------------------  Cardiac Enzymes No results for input(s): TROPONINI in the last 168 hours. ------------------------------------------------------------------------------------------------------------------  RADIOLOGY:  [place] Paracentesis  Result Date: 04/03/2017 INDICATION: End-stage renal disease, CHF, recurrent pleural effusions and ascites. Request received for therapeutic paracentesis. EXAM: ULTRASOUND GUIDED THERAPEUTIC PARACENTESIS MEDICATIONS: None. COMPLICATIONS: None immediate. PROCEDURE: Informed written consent was obtained from the patient after a discussion of the risks, benefits and alternatives to treatment. A timeout was performed prior to the initiation of the procedure. Initial ultrasound scanning demonstrates a small amount of ascites within the left lower abdominal quadrant. The left lower abdomen was prepped and draped in the usual sterile fashion. 1% lidocaine was used for local anesthesia. Following this, a Yueh catheter  was introduced. An  ultrasound image was saved for documentation purposes. The paracentesis was performed. The catheter was removed and a dressing was applied. The patient tolerated the procedure well without immediate post procedural complication. FINDINGS: A total of approximately 850 cc of clear, yellow fluid was removed. IMPRESSION: Successful ultrasound-guided therapeutic paracentesis yielding 850 cc of peritoneal fluid. Read by: Jeananne Rama, PA-C Electronically Signed   By: Corlis Leak M.D.   On: 04/03/2017 10:12   Dg Chest Port 1 View  Result Date: 04/03/2017 CLINICAL DATA:  Acute onset of shortness of breath and cough. Initial encounter. EXAM: PORTABLE CHEST 1 VIEW COMPARISON:  Chest radiograph performed 02/08/2017 FINDINGS: A moderate right-sided pleural effusion is noted. Right basilar airspace opacification raises concern for pneumonia. Underlying vascular congestion is noted, and interstitial edema might have a similar appearance. A small left pleural effusion is noted. No pneumothorax is seen. The cardiomediastinal silhouette is borderline normal in size. A left-sided dual-lumen catheter is noted about the distal SVC. No acute osseous abnormalities are seen. IMPRESSION: Moderate right-sided pleural effusion. Right basilar airspace opacification raises concern for pneumonia. Underlying vascular congestion noted; interstitial edema might have a similar appearance. Small left pleural effusion noted. Electronically Signed   By: Roanna Raider M.D.   On: 04/03/2017 22:00     ASSESSMENT AND PLAN:   46 year old male with past medical history of end-stage renal disease on hemodialysis, history of SBP, chronic systolic CHF, severe cardiomyopathy ejection fraction of 20%, diabetes, who presents to the hospital due to shortness of breath.   1. Shortness of breath-suspected to be secondary to the moderate pleural effusion noted on the chest x-ray. -Patient is clinically not hypoxic. We'll plan for ultrasound-guided  thoracentesis for tomorrow.  2. Hypotension-chronic for the patient secondary to his severe cardiomyopathy. No clinical evidence of sepsis. I will DC his IV antibiotics for now. Follow cultures, hemodynamics. Continue Midodrine.   3. ESRD on HD - nephrology consulted and cont. Tues, Thus, Sat schedule.  - plan for HD tomorrow.   4. Hx of Chronic A. Fib - cont. Amio.  - rate controlled.   5. Hyperthyroidism - cont. Methimazole.    6. GERD - cont. Protonix.   7. Depression - cont. Zoloft.   All the records are reviewed and case discussed with Care Management/Social Worker. Management plans discussed with the patient, family and they are in agreement.  CODE STATUS: Full code  DVT Prophylaxis: Hep. SQ  TOTAL TIME TAKING CARE OF THIS PATIENT: 30 minutes.   POSSIBLE D/C IN 1-2 DAYS, DEPENDING ON CLINICAL CONDITION.   Houston Siren M.D on 04/04/2017 at 2:13 PM  Between 7am to 6pm - Pager - [phone]  After 6pm go to [email or web address] - Social research officer, government  [facility]  Office  [phone]  CC: Primary care physician; Corky Downs, MD

## 2017-04-04 NOTE — Progress Notes (Signed)
Pharmacy Antibiotic Note  Juan BlossomWaltzie L Krotzer Jr. is a 46 y.o. male admitted on 04/03/2017 with sepsis.  Pharmacy has been consulted for vanc/cefepime dosing.  Plan: Patient received vanc 1g IV and cefepime 2g IV x 1 in ED  Will follow up w/ vanc 1.5g IV q24h w/ 6 hour stack dose. Will draw a vanc trough 7/7 @ 0300 prior to 4th dose. Ke 0.039 T1/2 17 hours ~ rounding to q24h and increasing dose for ease of administration.  Will start cefepime 2g IV q24h.  Height: 6\' 2"  (188 cm) Weight: 178 lb (80.7 kg) IBW/kg (Calculated) : 82.2  Temp (24hrs), Avg:100 F (37.8 C), Min:100 F (37.8 C), Max:100 F (37.8 C)   Recent Labs Lab 04/03/17 2148  WBC 5.6  CREATININE 2.47*  LATICACIDVEN 1.8    Estimated Creatinine Clearance: 41.7 mL/min (A) (by C-G formula based on SCr of 2.47 mg/dL (H)).    Allergies  Allergen Reactions  . Sulfur Other (See Comments)    Pt states that this medication causes renal failure.       Thank you for allowing pharmacy to be a part of this patient's care.  Thomasene Rippleavid Mariselda Badalamenti, PharmD, BCPS Clinical Pharmacist 04/04/2017

## 2017-04-04 NOTE — Progress Notes (Signed)
Requesting IV morphine and dilaudid for chronic abd pain, "Like I had the last time I was here," even though he states he does not take any pain medication at home for it.  Pt states at home, "I just deal with it."  Oxycontin given as ordered with benadryl to manage itching pt reports having w/ this and tramadol and hydrocodone.  Even with benadryl, pt states he woke up with his shirt wet because he was itching so much.  Pt requesting a back scratcher, none in hospital.  Towel given to pt and assisted him with using it to relieve itching.  Abd distended but soft with stretch marks.  LLQ dressing from paracentesis today C/D/I.  Pt unable to tolerate lying flat.  Must keep HOB at least 30 degrees or unable to breathe.  Encouraged pt to turn q2h due to reddened sacrum on admission.  Pink foam placed on sacrum for further protection.  Pt A&Ox3 and verbalizing understanding of all information and education.

## 2017-04-04 NOTE — Plan of Care (Signed)
Problem: Pain Managment: Goal: General experience of comfort will improve Outcome: Not Progressing Pt reports pain 8/10 after pain medication. States he does not take pain medication at home.

## 2017-04-04 NOTE — ED Notes (Signed)
Paged and spoke with Dr Anne HahnWillis about pt's 500 ml bolus; MD order to keep fluids running since pt's bp is soft; floor nurse Helmut MusterAlicia notified.

## 2017-04-05 ENCOUNTER — Inpatient Hospital Stay: Payer: BLUE CROSS/BLUE SHIELD

## 2017-04-05 LAB — RENAL FUNCTION PANEL
Albumin: 2.4 g/dL — ABNORMAL LOW (ref 3.5–5.0)
Anion gap: 11 (ref 5–15)
BUN: 48 mg/dL — ABNORMAL HIGH (ref 6–20)
CO2: 26 mmol/L (ref 22–32)
Calcium: 8.8 mg/dL — ABNORMAL LOW (ref 8.9–10.3)
Chloride: 94 mmol/L — ABNORMAL LOW (ref 101–111)
Creatinine, Ser: 3.63 mg/dL — ABNORMAL HIGH (ref 0.61–1.24)
GFR calc Af Amer: 21 mL/min — ABNORMAL LOW
GFR calc non Af Amer: 18 mL/min — ABNORMAL LOW
Glucose, Bld: 131 mg/dL — ABNORMAL HIGH (ref 65–99)
Phosphorus: 6.2 mg/dL — ABNORMAL HIGH (ref 2.5–4.6)
Potassium: 4.5 mmol/L (ref 3.5–5.1)
Sodium: 131 mmol/L — ABNORMAL LOW (ref 135–145)

## 2017-04-05 LAB — CBC
HCT: 27.9 % — ABNORMAL LOW (ref 40.0–52.0)
Hemoglobin: 8.5 g/dL — ABNORMAL LOW (ref 13.0–18.0)
MCH: 26 pg (ref 26.0–34.0)
MCHC: 30.6 g/dL — ABNORMAL LOW (ref 32.0–36.0)
MCV: 84.9 fL (ref 80.0–100.0)
Platelets: 217 10*3/uL (ref 150–440)
RBC: 3.28 MIL/uL — ABNORMAL LOW (ref 4.40–5.90)
RDW: 19.9 % — ABNORMAL HIGH (ref 11.5–14.5)
WBC: 5.2 10*3/uL (ref 3.8–10.6)

## 2017-04-05 LAB — GLUCOSE, CAPILLARY
GLUCOSE-CAPILLARY: 120 mg/dL — AB (ref 65–99)
Glucose-Capillary: 139 mg/dL — ABNORMAL HIGH (ref 65–99)

## 2017-04-05 MED ORDER — EPOETIN ALFA 10000 UNIT/ML IJ SOLN
10000.0000 [IU] | INTRAMUSCULAR | Status: DC
  Administered 2017-04-05: 10000 [IU] via INTRAVENOUS

## 2017-04-05 MED ORDER — ALTEPLASE 2 MG IJ SOLR
4.0000 mg | Freq: Once | INTRAMUSCULAR | Status: AC
  Administered 2017-04-05: 4 mg
  Filled 2017-04-05: qty 4

## 2017-04-05 MED ORDER — ACETAMINOPHEN 325 MG PO TABS
650.0000 mg | ORAL_TABLET | Freq: Once | ORAL | Status: AC
  Administered 2017-04-05: 650 mg via ORAL

## 2017-04-05 MED ORDER — INSULIN ASPART 100 UNIT/ML ~~LOC~~ SOLN
0.0000 [IU] | Freq: Three times a day (TID) | SUBCUTANEOUS | Status: DC
Start: 2017-04-05 — End: 2017-04-05

## 2017-04-05 MED ORDER — PROMETHAZINE HCL 25 MG/ML IJ SOLN
12.5000 mg | INTRAMUSCULAR | Status: AC
  Administered 2017-04-05: 12.5 mg via INTRAVENOUS
  Filled 2017-04-05: qty 1

## 2017-04-05 NOTE — Progress Notes (Signed)
Pre hd info 

## 2017-04-05 NOTE — Progress Notes (Signed)
Post hd vitals 

## 2017-04-05 NOTE — Progress Notes (Signed)
Hd start, cvc reversed, bfr 200, md present and aware, pt stable, no c/o.

## 2017-04-05 NOTE — Progress Notes (Signed)
Pre hd assessment  

## 2017-04-05 NOTE — Progress Notes (Signed)
  End of hd 

## 2017-04-05 NOTE — Progress Notes (Signed)
Post hd assessment 

## 2017-04-05 NOTE — Progress Notes (Signed)
HD paused. Cathflo ordered d/t CVC arterial not functioning. AP too high for tx. Pt alert, no c/o, stable. Kolluru MD present and aware.

## 2017-04-05 NOTE — Care Management (Signed)
Patient admitted for SOB. Patient lives at home with daughter. Patient chronic HD patient [name] [name] HD liaison notified.  [facility].  Patient agreeable to services, however he states it depends on if he has a copay or not if he will be able to accept.  RNCM signing off.

## 2017-04-05 NOTE — Progress Notes (Signed)
[facility]  ROUNDING NOTE   Subjective:    Seen and examined on hemodialysis. Tolerating treatment well. UF 1.5.   Required cathflo for 60 minutes before catheter started to run.    HEMODIALYSIS FLOWSHEET:  Blood Flow Rate (mL/min): 325 mL/min Arterial Pressure (mmHg): -180 mmHg Venous Pressure (mmHg): 120 mmHg Transmembrane Pressure (mmHg): 70 mmHg Ultrafiltration Rate (mL/min): 730 mL/min Dialysate Flow Rate (mL/min): 600 ml/min Conductivity: Machine : 13.9 Conductivity: Machine : 13.9 Dialysis Fluid Bolus: Normal Saline Bolus Amount (mL): 250 mL Dialysate Change:  (3k)     Objective:  Vital signs in last 24 hours:  Temp:  [97.7 F (36.5 C)-98.2 F (36.8 C)] 97.7 F (36.5 C) (07/05 1055) Pulse Rate:  [80-112] 86 (07/05 1420) Resp:  [10-18] 10 (07/05 1420) BP: (98-139)/(69-90) 107/69 (07/05 1420) SpO2:  [92 %-100 %] 100 % (07/05 1420) Weight:  [82.6 kg (182 lb 1.6 oz)-83.3 kg (183 lb 9.6 oz)] 82.6 kg (182 lb 1.6 oz) (07/05 1055)  Weight change: 2.54 kg (5 lb 9.6 oz) Filed Weights   04/04/17 0500 04/05/17 0500 04/05/17 1055  Weight: 76.3 kg (168 lb 3.2 oz) 83.3 kg (183 lb 9.6 oz) 82.6 kg (182 lb 1.6 oz)    Intake/Output: I/O last 3 completed shifts: In: 1590 [P.O.:340; IV Piggyback:1250] Out: 35 [Urine:35]   Intake/Output this shift:  No intake/output data recorded.  Physical Exam: General: No acute distress  Head: Normocephalic, atraumatic. Moist oral mucosal membranes  Eyes: Anicteric  Neck: Supple,    Lungs:  clear  Heart: S1S2 no rubs, irregular  Abdomen:  Soft, nontender  Extremities: no edema.  Neurologic: Awake, alert, following commands  Skin: No lesions  Access: L IJ permcath    Basic Metabolic Panel:  Recent Labs Lab 04/03/17 2148 04/04/17 0530 04/05/17 1134  NA 137 133* 131*  K 3.7 3.9 4.5  CL 98* 98* 94*  CO2 28 30 26   GLUCOSE 153* 196* 131*  BUN 35* 41* 48*  CREATININE 2.47* 2.74* 3.63*  CALCIUM 8.3* 8.3* 8.8*   PHOS  --   --  6.2*    Liver Function Tests:  Recent Labs Lab 04/03/17 2148 04/05/17 1134  AST 23  --   ALT 14*  --   ALKPHOS 88  --   BILITOT 1.1  --   PROT 7.2  --   ALBUMIN 2.6* 2.4*   No results for input(s): LIPASE, AMYLASE in the last 168 hours.  Recent Labs Lab 04/04/17 0008  AMMONIA 34    CBC:  Recent Labs Lab 04/03/17 2148 04/04/17 0530 04/05/17 1134  WBC 5.6 5.8 5.2  NEUTROABS 3.8  --   --   HGB 8.8* 8.8* 8.5*  HCT 28.0* 28.2* 27.9*  MCV 85.2 84.4 84.9  PLT 240 214 217    Cardiac Enzymes: No results for input(s): CKTOTAL, CKMB, CKMBINDEX, TROPONINI in the last 168 hours.  BNP: Invalid input(s): POCBNP  CBG:  Recent Labs Lab 04/04/17 0838 04/04/17 1147 04/05/17 0746  GLUCAP 163* 148* 139*    Microbiology: Results for orders placed or performed during the hospital encounter of 04/03/17  Blood Culture (routine x 2)     Status: None (Preliminary result)   Collection Time: 04/03/17  9:49 PM  Result Value Ref Range Status   Specimen Description BLOOD BLOOD LEFT HAND  Final   Special Requests   Final    BOTTLES DRAWN AEROBIC AND ANAEROBIC Blood Culture adequate volume   Culture  Setup Time   Final  GRAM POSITIVE COCCI IN BOTH AEROBIC AND ANAEROBIC BOTTLES CRITICAL RESULT CALLED TO, READ BACK BY AND VERIFIED WITH: NATE COOKSON @ 1746 04/04/17 BY  [facility]    Culture   Final    GRAM POSITIVE COCCI CULTURE REINCUBATED FOR BETTER GROWTH Performed at [facility], [address] [place], [place] [id number]    Report Status PENDING  Incomplete  Blood Culture (routine x 2)     Status: None (Preliminary result)   Collection Time: 04/03/17  9:49 PM  Result Value Ref Range Status   Specimen Description BLOOD RIGHT HAND  Final   Special Requests   Final    BOTTLES DRAWN AEROBIC AND ANAEROBIC Blood Culture adequate volume   Culture NO GROWTH 2 DAYS  Final   Report Status PENDING  Incomplete  Blood Culture ID Panel (Reflexed)     Status:  Abnormal   Collection Time: 04/03/17  9:49 PM  Result Value Ref Range Status   Enterococcus species NOT DETECTED NOT DETECTED Final   Listeria monocytogenes NOT DETECTED NOT DETECTED Final   Staphylococcus species DETECTED (A) NOT DETECTED Final    Comment: Methicillin (oxacillin) resistant coagulase negative staphylococcus. Possible blood culture contaminant (unless isolated from more than one blood culture draw or clinical case suggests pathogenicity). No antibiotic treatment is indicated for blood  culture contaminants. CRITICAL RESULT CALLED TO, READ BACK BY AND VERIFIED WITH: NATE COOKSON @ 1746 04/04/17 BY T[facility]    Staphylococcus aureus NOT DETECTED NOT DETECTED Final   Methicillin resistance DETECTED (A) NOT DETECTED Final    Comment: CRITICAL RESULT CALLED TO, READ BACK BY AND VERIFIED WITH: NATE COOKSON @ 1746 04/04/17 BY T[facility]    Streptococcus species DETECTED (A) NOT DETECTED Final    Comment: Not Enterococcus species, Streptococcus agalactiae, Streptococcus pyogenes, or Streptococcus pneumoniae. CRITICAL RESULT CALLED TO, READ BACK BY AND VERIFIED WITH: NATE COOKSON @ 1746 04/04/17 BY [facility]    Streptococcus agalactiae NOT DETECTED NOT DETECTED Final   Streptococcus pneumoniae NOT DETECTED NOT DETECTED Final   Streptococcus pyogenes NOT DETECTED NOT DETECTED Final   Acinetobacter baumannii NOT DETECTED NOT DETECTED Final   Enterobacteriaceae species NOT DETECTED NOT DETECTED Final   Enterobacter cloacae complex NOT DETECTED NOT DETECTED Final   Escherichia coli NOT DETECTED NOT DETECTED Final   Klebsiella oxytoca NOT DETECTED NOT DETECTED Final   Klebsiella pneumoniae NOT DETECTED NOT DETECTED Final   Proteus species NOT DETECTED NOT DETECTED Final   Serratia marcescens NOT DETECTED NOT DETECTED Final   Haemophilus influenzae NOT DETECTED NOT DETECTED Final   Neisseria meningitidis NOT DETECTED NOT DETECTED Final   Pseudomonas aeruginosa NOT DETECTED NOT DETECTED Final   Candida  albicans NOT DETECTED NOT DETECTED Final   Candida glabrata NOT DETECTED NOT DETECTED Final   Candida krusei NOT DETECTED NOT DETECTED Final   Candida parapsilosis NOT DETECTED NOT DETECTED Final   Candida tropicalis NOT DETECTED NOT DETECTED Final    Coagulation Studies:  Recent Labs  04/03/17 2148  LABPROT 16.6*  INR 1.33    Urinalysis:  Recent Labs  04/03/17 1620  COLORURINE AMBER*  LABSPEC 1.020  PHURINE 5.0  GLUCOSEU 50*  HGBUR NEGATIVE  BILIRUBINUR SMALL*  KETONESUR NEGATIVE  PROTEINUR 100*  NITRITE NEGATIVE  LEUKOCYTESUR TRACE*      Imaging: Dg Chest 1 View  Result Date: 04/05/2017 CLINICAL DATA:  Status Post Thoracentesis. EXAM: CHEST 1 VIEW COMPARISON:  04/03/2017 FINDINGS: Left-sided dialysis catheter tip overlies the lower superior vena cava. cardiomediastinal silhouette is  prominent as before. There is a smaller right pleural effusion. Improved aeration in the right lower lobe. No pneumothorax following thoracentesis. Left pleural effusion is also present. There is left basilar opacity, obscuring the hemidiaphragm. IMPRESSION: No pneumothorax following thoracentesis. Bilateral pleural effusions, smaller on the right. Electronically Signed   By: Norva Pavlov M.D.   On: 04/05/2017 10:35   Dg Chest Port 1 View  Result Date: 04/03/2017 CLINICAL DATA:  Acute onset of shortness of breath and cough. Initial encounter. EXAM: PORTABLE CHEST 1 VIEW COMPARISON:  Chest radiograph performed 02/08/2017 FINDINGS: A moderate right-sided pleural effusion is noted. Right basilar airspace opacification raises concern for pneumonia. Underlying vascular congestion is noted, and interstitial edema might have a similar appearance. A small left pleural effusion is noted. No pneumothorax is seen. The cardiomediastinal silhouette is borderline normal in size. A left-sided dual-lumen catheter is noted about the distal SVC. No acute osseous abnormalities are seen. IMPRESSION: Moderate  right-sided pleural effusion. Right basilar airspace opacification raises concern for pneumonia. Underlying vascular congestion noted; interstitial edema might have a similar appearance. Small left pleural effusion noted. Electronically Signed   By: Roanna Raider M.D.   On: 04/03/2017 22:00   US Thoracentesis Asp Pleural Space W/img Guide  Result Date: 04/05/2017 CLINICAL DATA:  Right pleural effusion and shortness of breath. EXAM: ULTRASOUND GUIDED RIGHT THORACENTESIS COMPARISON:  Prior thoracentesis on 02/08/2017 PROCEDURE: An ultrasound guided thoracentesis was thoroughly discussed with the patient and questions answered. The benefits, risks, alternatives and complications were also discussed. The patient understands and wishes to proceed with the procedure. Written consent was obtained. Ultrasound was performed to localize and mark an adequate pocket of fluid in the right chest. The area was then prepped and draped in the normal sterile fashion. 1% Lidocaine was used for local anesthesia. Under ultrasound guidance a 6 French Safe-T-Centesis catheter was introduced. Thoracentesis was performed. The catheter was removed and a dressing applied. COMPLICATIONS: None FINDINGS: A total of approximately 2 L of dark colored fluid was removed. After 2 L of fluid removal, the patient could not tolerate further fluid removal due to right-sided chest pain. IMPRESSION: Successful ultrasound guided right thoracentesis yielding 2 L of pleural fluid. Electronically Signed   By: Irish Lack M.D.   On: 04/05/2017 13:40     Medications:   . amiodarone  200 mg Oral Daily  . aspirin EC  81 mg Oral Daily  . epoetin (EPOGEN/PROCRIT) injection  10,000 Units Intravenous Q T,Th,Sa-HD  . fluticasone furoate-vilanterol  1 puff Inhalation Daily  . heparin  5,000 Units Subcutaneous Q8H  . insulin aspart  0-9 Units Subcutaneous TID WC  . methimazole  10 mg Oral Daily  . midodrine  10 mg Oral TID WC  . pantoprazole  40 mg  Oral Daily  . sertraline  50 mg Oral Daily      Assessment/ Plan:  46 y.o.white male with End stage renal disease on hemodialysis, hypertension, diabetes mellitus type 2, peripheral neuropathy, atrial fibrillation, multiple episodes of spontaneous bacterial peritonitis, depression, hyperthyroidism, cardiac arrest, systolic congestive heart failure   [name]   1. ESRD on HD TTS: with complication of hemodialysis device. Status post cathflo dwell.  Seen and examined on hemodialysis. Continue TTS schedule  2. Anemia chronic kidney disease: hemoglobin 8.5 - EPO with HD treatment  3. Secondary hyperparathyroidism: outpatient PTH 219, phosphorus 4.5, calcium 9.  Not currently on a binder.   4. Hypertension: with atrial fibrillation. Did not require midodrine before today's treatment  LOS: 2 Juan Roberson 7/5/20182:34 PM

## 2017-04-05 NOTE — Progress Notes (Signed)
Patient off floor in dialysis. Dr. Lester CarolinaSainini planning to discharge patient when he arrives back to unit.

## 2017-04-05 NOTE — Progress Notes (Signed)
Hd resumed

## 2017-04-06 LAB — HEPATITIS B SURFACE ANTIGEN: Hepatitis B Surface Ag: NEGATIVE

## 2017-04-06 NOTE — Discharge Summary (Signed)
[employer] - [place] [facility]   PATIENT NAME: Juan Roberson    MR#:  [id number]  DATE OF BIRTH:  02/15/1971  DATE OF ADMISSION:  04/03/2017 ADMITTING PHYSICIAN: Oralia Manis, MD  DATE OF DISCHARGE: 04/05/2017  5:06 PM  PRIMARY CARE PHYSICIAN: Corky Downs, MD    ADMISSION DIAGNOSIS:  HCAP (healthcare-associated pneumonia) [J18.9] Sepsis, due to unspecified organism (HCC) [A41.9]  DISCHARGE DIAGNOSIS:  Principal Problem:   Sepsis (HCC) Active Problems:   CKD (chronic kidney disease), stage III   HCAP (healthcare-associated pneumonia)   Chronic combined systolic and diastolic CHF (congestive heart failure) (HCC)   HTN (hypertension)   GERD (gastroesophageal reflux disease)   Pressure injury of skin   SECONDARY DIAGNOSIS:   Past Medical History:  Diagnosis Date  . Cellulitis and abscess of foot    Left-Dr. Ether Griffins  . CHF (congestive heart failure) (HCC)    EF 20%  . CKD (chronic kidney disease), stage III   . Diabetes mellitus without complication (HCC)    a. Dx ~ 1996.  [name] Dysrhythmia   . Essential hypertension   . Gastritis    a. 04/2015 hematemesis -> EGD: gastritis, esophagitis, duodenitis.  No active bleeding.  PPI added.  [name] GERD (gastroesophageal reflux disease)   . Left leg DVT (HCC)    a. Dx 05/2015 -> Coumadin.  . MI (myocardial infarction) (HCC)   . Osteomyelitis (HCC)    a. 05/2015 L foot.    HOSPITAL COURSE:   46 year old male with past medical history of end-stage renal disease on hemodialysis, history of SBP, chronic systolic CHF, severe cardiomyopathy ejection fraction of 20%, diabetes, who presents to the hospital due to shortness of breath.  1. Shortness of breath-suspected to be secondary to the moderate pleural effusion noted on the chest x-ray. -Patient underwent successful ultrasound-guided thoracentesis with about 2 L of fluid removed. Postprocedure patient shortness of breath and hypoxia had improved. He was therefore  discharged home.  2. Hypotension-chronic for the patient secondary to his severe cardiomyopathy. No clinical evidence of sepsis. His blood cultures were consistent with contaminant without any evidence of sepsis. Patient will continue his midodrine  as stated. 3. ESRD on HD - nephrology consulted and pt. Maintained on Tue, Thus, Sat schedule and will cont. That.   4. Hx of Chronic A. Fib - he will cont. Amiodarone.   - it remained rate controlled.  5. Hyperthyroidism - he will cont. Methimazole.    6. GERD - he will cont. Protonix.   7. Depression - he will cont. Zoloft.   DISCHARGE CONDITIONS:   Stable  CONSULTS OBTAINED:    DRUG ALLERGIES:   Allergies  Allergen Reactions  . Sulfur Other (See Comments)    Pt states that this medication causes renal failure.    . Hydrocodone Itching  . Oxycodone Itching  . Tramadol Itching    DISCHARGE MEDICATIONS:   Allergies as of 04/05/2017      Reactions   Sulfur Other (See Comments)   Pt states that this medication causes renal failure.     Hydrocodone Itching   Oxycodone Itching   Tramadol Itching      Medication List    TAKE these medications   amiodarone 200 MG tablet Commonly known as:  PACERONE Take 1 tablet (200 mg total) by mouth daily.   aspirin 81 MG EC tablet Take 1 tablet (81 mg total) by mouth daily.   cefpodoxime 200 MG tablet Commonly known as:  VANTIN Take 1  tablet (200 mg total) by mouth every Tuesday, Thursday, and Saturday at 6 PM. Take in the evening after dialysis- life long   fluticasone furoate-vilanterol 100-25 MCG/INH Aepb Commonly known as:  BREO ELLIPTA Inhale 1 puff into the lungs daily.   insulin aspart 100 UNIT/ML injection Commonly known as:  novoLOG Inject 4 Units into the skin 3 (three) times daily with meals.   methimazole 10 MG tablet Commonly known as:  TAPAZOLE Take 1 tablet (10 mg total) by mouth daily.   midodrine 10 MG tablet Commonly known as:  PROAMATINE Take 1  tablet (10 mg total) by mouth 3 (three) times daily with meals.   multivitamin Tabs tablet Take 1 tablet by mouth at bedtime.   pantoprazole 40 MG tablet Commonly known as:  PROTONIX Take 1 tablet (40 mg total) by mouth daily.   sertraline 50 MG tablet Commonly known as:  ZOLOFT Take 50 mg by mouth daily.   zolpidem 5 MG tablet Commonly known as:  AMBIEN Take 5 mg by mouth at bedtime as needed for sleep.         DISCHARGE INSTRUCTIONS:   DIET:  Renal diet  DISCHARGE CONDITION:  Stable  ACTIVITY:  Activity as tolerated  OXYGEN:  Home Oxygen: No.   Oxygen Delivery: room air  DISCHARGE LOCATION:  home   If you experience worsening of your admission symptoms, develop shortness of breath, life threatening emergency, suicidal or homicidal thoughts you must seek medical attention immediately by calling 911 or calling your MD immediately  if symptoms less severe.  You Must read complete instructions/literature along with all the possible adverse reactions/side effects for all the Medicines you take and that have been prescribed to you. Take any new Medicines after you have completely understood and accpet all the possible adverse reactions/side effects.   Please note  You were cared for by a hospitalist during your hospital stay. If you have any questions about your discharge medications or the care you received while you were in the hospital after you are discharged, you can call the unit and asked to speak with the hospitalist on call if the hospitalist that took care of you is not available. Once you are discharged, your primary care physician will handle any further medical issues. Please note that NO REFILLS for any discharge medications will be authorized once you are discharged, as it is imperative that you return to your primary care physician (or establish a relationship with a primary care physician if you do not have one) for your aftercare needs so that they can  reassess your need for medications and monitor your lab values.     Today   Status post ultrasound-guided thoracentesis. Shortness of breath much improved. Seen at hemodialysis and tolerating well. Hypotension has resolved. No nausea vomiting or abdominal pain.  VITAL SIGNS:  Blood pressure 110/71, pulse 91, temperature 98.1 F (36.7 C), temperature source Oral, resp. rate 12, height 6\' 2"  (1.88 m), weight 82.6 kg (182 lb 1.6 oz), SpO2 100 %.  I/O:   Intake/Output Summary (Last 24 hours) at 04/06/17 1440 Last data filed at 04/05/17 1530  Gross per 24 hour  Intake                0 ml  Output             1450 ml  Net            -1450 ml    PHYSICAL EXAMINATION:   GENERAL:  46 y.o.-year-old patient lying in bed in no acute distress.  EYES: Pupils equal, round, reactive to light and accommodation. No scleral icterus. Extraocular muscles intact.  HEENT: Head atraumatic, normocephalic. Oropharynx and nasopharynx clear.  NECK:  Supple, no jugular venous distention. No thyroid enlargement, no tenderness.  LUNGS: Poor A/E in right mid to lower lung fileds, no wheezing, rales, rhonchi. No use of accessory muscles of respiration.  CARDIOVASCULAR: S1, S2 normal. No murmurs, rubs, or gallops.  ABDOMEN: Soft, nontender, nondistended. Bowel sounds present. No organomegaly or mass.  EXTREMITIES: No cyanosis, clubbing or edema b/l.    NEUROLOGIC: Cranial nerves II through XII are intact. No focal Motor or sensory deficits b/l.   PSYCHIATRIC: The patient is alert and oriented x 3.  SKIN: No obvious rash, lesion, or ulcer.   DATA REVIEW:   CBC  Recent Labs Lab 04/05/17 1134  WBC 5.2  HGB 8.5*  HCT 27.9*  PLT 217    Chemistries   Recent Labs Lab 04/03/17 2148  04/05/17 1134  NA 137  < > 131*  K 3.7  < > 4.5  CL 98*  < > 94*  CO2 28  < > 26  GLUCOSE 153*  < > 131*  BUN 35*  < > 48*  CREATININE 2.47*  < > 3.63*  CALCIUM 8.3*  < > 8.8*  AST 23  --   --   ALT 14*  --   --    ALKPHOS 88  --   --   BILITOT 1.1  --   --   < > = values in this interval not displayed.  Cardiac Enzymes No results for input(s): TROPONINI in the last 168 hours.  Microbiology Results  Results for orders placed or performed during the hospital encounter of 04/03/17  Blood Culture (routine x 2)     Status: Abnormal (Preliminary result)   Collection Time: 04/03/17  9:49 PM  Result Value Ref Range Status   Specimen Description BLOOD BLOOD LEFT HAND  Final   Special Requests   Final    BOTTLES DRAWN AEROBIC AND ANAEROBIC Blood Culture adequate volume   Culture  Setup Time   Final    GRAM POSITIVE COCCI IN BOTH AEROBIC AND ANAEROBIC BOTTLES CRITICAL RESULT CALLED TO, READ BACK BY AND VERIFIED WITH: NATE COOKSON @ 1746 04/04/17 BY  [facility]    Culture STAPHYLOCOCCUS SPECIES (COAGULASE NEGATIVE) (A)  Final   Report Status PENDING  Incomplete  Blood Culture (routine x 2)     Status: None (Preliminary result)   Collection Time: 04/03/17  9:49 PM  Result Value Ref Range Status   Specimen Description BLOOD RIGHT HAND  Final   Special Requests   Final    BOTTLES DRAWN AEROBIC AND ANAEROBIC Blood Culture adequate volume   Culture NO GROWTH 3 DAYS  Final   Report Status PENDING  Incomplete  Blood Culture ID Panel (Reflexed)     Status: Abnormal   Collection Time: 04/03/17  9:49 PM  Result Value Ref Range Status   Enterococcus species NOT DETECTED NOT DETECTED Final   Listeria monocytogenes NOT DETECTED NOT DETECTED Final   Staphylococcus species DETECTED (A) NOT DETECTED Final    Comment: Methicillin (oxacillin) resistant coagulase negative staphylococcus. Possible blood culture contaminant (unless isolated from more than one blood culture draw or clinical case suggests pathogenicity). No antibiotic treatment is indicated for blood  culture contaminants. CRITICAL RESULT CALLED TO, READ BACK BY AND VERIFIED WITH: NATE COOKSON @ 1746 04/04/17  BY [facility]    Staphylococcus aureus NOT DETECTED NOT  DETECTED Final   Methicillin resistance DETECTED (A) NOT DETECTED Final    Comment: CRITICAL RESULT CALLED TO, READ BACK BY AND VERIFIED WITH: NATE COOKSON @ 1746 04/04/17 BY [facility]    Streptococcus species DETECTED (A) NOT DETECTED Final    Comment: Not Enterococcus species, Streptococcus agalactiae, Streptococcus pyogenes, or Streptococcus pneumoniae. CRITICAL RESULT CALLED TO, READ BACK BY AND VERIFIED WITH: NATE COOKSON @ 1746 04/04/17 BY [facility]    Streptococcus agalactiae NOT DETECTED NOT DETECTED Final   Streptococcus pneumoniae NOT DETECTED NOT DETECTED Final   Streptococcus pyogenes NOT DETECTED NOT DETECTED Final   Acinetobacter baumannii NOT DETECTED NOT DETECTED Final   Enterobacteriaceae species NOT DETECTED NOT DETECTED Final   Enterobacter cloacae complex NOT DETECTED NOT DETECTED Final   Escherichia coli NOT DETECTED NOT DETECTED Final   Klebsiella oxytoca NOT DETECTED NOT DETECTED Final   Klebsiella pneumoniae NOT DETECTED NOT DETECTED Final   Proteus species NOT DETECTED NOT DETECTED Final   Serratia marcescens NOT DETECTED NOT DETECTED Final   Haemophilus influenzae NOT DETECTED NOT DETECTED Final   Neisseria meningitidis NOT DETECTED NOT DETECTED Final   Pseudomonas aeruginosa NOT DETECTED NOT DETECTED Final   Candida albicans NOT DETECTED NOT DETECTED Final   Candida glabrata NOT DETECTED NOT DETECTED Final   Candida krusei NOT DETECTED NOT DETECTED Final   Candida parapsilosis NOT DETECTED NOT DETECTED Final   Candida tropicalis NOT DETECTED NOT DETECTED Final    RADIOLOGY:  Dg Chest 1 View  Result Date: 04/05/2017 CLINICAL DATA:  Status Post Thoracentesis. EXAM: CHEST 1 VIEW COMPARISON:  04/03/2017 FINDINGS: Left-sided dialysis catheter tip overlies the lower superior vena cava. cardiomediastinal silhouette is prominent as before. There is a smaller right pleural effusion. Improved aeration in the right lower lobe. No pneumothorax following thoracentesis. Left pleural  effusion is also present. There is left basilar opacity, obscuring the hemidiaphragm. IMPRESSION: No pneumothorax following thoracentesis. Bilateral pleural effusions, smaller on the right. Electronically Signed   By: Norva Pavlov M.D.   On: 04/05/2017 10:35   US Thoracentesis Asp Pleural Space W/img Guide  Result Date: 04/05/2017 CLINICAL DATA:  Right pleural effusion and shortness of breath. EXAM: ULTRASOUND GUIDED RIGHT THORACENTESIS COMPARISON:  Prior thoracentesis on 02/08/2017 PROCEDURE: An ultrasound guided thoracentesis was thoroughly discussed with the patient and questions answered. The benefits, risks, alternatives and complications were also discussed. The patient understands and wishes to proceed with the procedure. Written consent was obtained. Ultrasound was performed to localize and mark an adequate pocket of fluid in the right chest. The area was then prepped and draped in the normal sterile fashion. 1% Lidocaine was used for local anesthesia. Under ultrasound guidance a 6 French Safe-T-Centesis catheter was introduced. Thoracentesis was performed. The catheter was removed and a dressing applied. COMPLICATIONS: None FINDINGS: A total of approximately 2 L of dark colored fluid was removed. After 2 L of fluid removal, the patient could not tolerate further fluid removal due to right-sided chest pain. IMPRESSION: Successful ultrasound guided right thoracentesis yielding 2 L of pleural fluid. Electronically Signed   By: Irish Lack M.D.   On: 04/05/2017 13:40      Management plans discussed with the patient, family and they are in agreement.   TOTAL TIME TAKING CARE OF THIS PATIENT: 40 minutes.    Houston Siren M.D on 04/06/2017 at 2:40 PM  Between 7am to 6pm - Pager - [phone]  After 6pm go to [email or web address] -  password Environmental education officer  [employer] [facility]  Office  [phone]  CC: Primary care physician; Corky Downs, MD

## 2017-04-07 LAB — CULTURE, BLOOD (ROUTINE X 2): Special Requests: ADEQUATE

## 2017-04-08 ENCOUNTER — Emergency Department: Payer: BLUE CROSS/BLUE SHIELD

## 2017-04-08 ENCOUNTER — Encounter: Payer: Self-pay | Admitting: Emergency Medicine

## 2017-04-08 ENCOUNTER — Inpatient Hospital Stay
Admission: EM | Admit: 2017-04-08 | Discharge: 2017-04-13 | DRG: 193 | Disposition: A | Discharge status: Hospice | Payer: BLUE CROSS/BLUE SHIELD | Attending: Internal Medicine | Admitting: Internal Medicine

## 2017-04-08 DIAGNOSIS — S00219D Abrasion of unspecified eyelid and periocular area, subsequent encounter: Secondary | ICD-10-CM

## 2017-04-08 DIAGNOSIS — R231 Pallor: Secondary | ICD-10-CM | POA: Diagnosis present

## 2017-04-08 DIAGNOSIS — Z515 Encounter for palliative care: Secondary | ICD-10-CM

## 2017-04-08 DIAGNOSIS — E0520 Thyrotoxicosis with toxic multinodular goiter without thyrotoxic crisis or storm: Secondary | ICD-10-CM | POA: Diagnosis present

## 2017-04-08 DIAGNOSIS — Z8049 Family history of malignant neoplasm of other genital organs: Secondary | ICD-10-CM

## 2017-04-08 DIAGNOSIS — Z801 Family history of malignant neoplasm of trachea, bronchus and lung: Secondary | ICD-10-CM

## 2017-04-08 DIAGNOSIS — R778 Other specified abnormalities of plasma proteins: Secondary | ICD-10-CM

## 2017-04-08 DIAGNOSIS — F419 Anxiety disorder, unspecified: Secondary | ICD-10-CM | POA: Diagnosis present

## 2017-04-08 DIAGNOSIS — E1142 Type 2 diabetes mellitus with diabetic polyneuropathy: Secondary | ICD-10-CM | POA: Diagnosis present

## 2017-04-08 DIAGNOSIS — J90 Pleural effusion, not elsewhere classified: Secondary | ICD-10-CM

## 2017-04-08 DIAGNOSIS — S60512D Abrasion of left hand, subsequent encounter: Secondary | ICD-10-CM

## 2017-04-08 DIAGNOSIS — L02612 Cutaneous abscess of left foot: Secondary | ICD-10-CM | POA: Diagnosis present

## 2017-04-08 DIAGNOSIS — Z7189 Other specified counseling: Secondary | ICD-10-CM

## 2017-04-08 DIAGNOSIS — E43 Unspecified severe protein-calorie malnutrition: Secondary | ICD-10-CM | POA: Diagnosis present

## 2017-04-08 DIAGNOSIS — Z803 Family history of malignant neoplasm of breast: Secondary | ICD-10-CM

## 2017-04-08 DIAGNOSIS — S0990XA Unspecified injury of head, initial encounter: Secondary | ICD-10-CM

## 2017-04-08 DIAGNOSIS — K219 Gastro-esophageal reflux disease without esophagitis: Secondary | ICD-10-CM | POA: Diagnosis present

## 2017-04-08 DIAGNOSIS — Z794 Long term (current) use of insulin: Secondary | ICD-10-CM

## 2017-04-08 DIAGNOSIS — X58XXXD Exposure to other specified factors, subsequent encounter: Secondary | ICD-10-CM | POA: Diagnosis present

## 2017-04-08 DIAGNOSIS — R4182 Altered mental status, unspecified: Secondary | ICD-10-CM | POA: Diagnosis present

## 2017-04-08 DIAGNOSIS — J189 Pneumonia, unspecified organism: Secondary | ICD-10-CM | POA: Diagnosis present

## 2017-04-08 DIAGNOSIS — Z8674 Personal history of sudden cardiac arrest: Secondary | ICD-10-CM

## 2017-04-08 DIAGNOSIS — Z79891 Long term (current) use of opiate analgesic: Secondary | ICD-10-CM

## 2017-04-08 DIAGNOSIS — I5022 Chronic systolic (congestive) heart failure: Secondary | ICD-10-CM | POA: Diagnosis present

## 2017-04-08 DIAGNOSIS — Z992 Dependence on renal dialysis: Secondary | ICD-10-CM

## 2017-04-08 DIAGNOSIS — I132 Hypertensive heart and chronic kidney disease with heart failure and with stage 5 chronic kidney disease, or end stage renal disease: Secondary | ICD-10-CM | POA: Diagnosis present

## 2017-04-08 DIAGNOSIS — E875 Hyperkalemia: Secondary | ICD-10-CM | POA: Diagnosis present

## 2017-04-08 DIAGNOSIS — I248 Other forms of acute ischemic heart disease: Secondary | ICD-10-CM | POA: Diagnosis present

## 2017-04-08 DIAGNOSIS — F329 Major depressive disorder, single episode, unspecified: Secondary | ICD-10-CM | POA: Diagnosis present

## 2017-04-08 DIAGNOSIS — M549 Dorsalgia, unspecified: Secondary | ICD-10-CM | POA: Diagnosis present

## 2017-04-08 DIAGNOSIS — N186 End stage renal disease: Secondary | ICD-10-CM | POA: Diagnosis present

## 2017-04-08 DIAGNOSIS — R0902 Hypoxemia: Secondary | ICD-10-CM

## 2017-04-08 DIAGNOSIS — E1122 Type 2 diabetes mellitus with diabetic chronic kidney disease: Secondary | ICD-10-CM | POA: Diagnosis present

## 2017-04-08 DIAGNOSIS — D631 Anemia in chronic kidney disease: Secondary | ICD-10-CM | POA: Diagnosis present

## 2017-04-08 DIAGNOSIS — E872 Acidosis, unspecified: Secondary | ICD-10-CM

## 2017-04-08 DIAGNOSIS — Z66 Do not resuscitate: Secondary | ICD-10-CM | POA: Diagnosis present

## 2017-04-08 DIAGNOSIS — Y95 Nosocomial condition: Secondary | ICD-10-CM | POA: Diagnosis present

## 2017-04-08 DIAGNOSIS — Z7982 Long term (current) use of aspirin: Secondary | ICD-10-CM

## 2017-04-08 DIAGNOSIS — R7989 Other specified abnormal findings of blood chemistry: Secondary | ICD-10-CM

## 2017-04-08 DIAGNOSIS — I252 Old myocardial infarction: Secondary | ICD-10-CM

## 2017-04-08 DIAGNOSIS — Z86718 Personal history of other venous thrombosis and embolism: Secondary | ICD-10-CM

## 2017-04-08 DIAGNOSIS — R531 Weakness: Secondary | ICD-10-CM | POA: Diagnosis not present

## 2017-04-08 DIAGNOSIS — M869 Osteomyelitis, unspecified: Secondary | ICD-10-CM | POA: Diagnosis present

## 2017-04-08 DIAGNOSIS — R4789 Other speech disturbances: Secondary | ICD-10-CM | POA: Diagnosis present

## 2017-04-08 DIAGNOSIS — Z8249 Family history of ischemic heart disease and other diseases of the circulatory system: Secondary | ICD-10-CM

## 2017-04-08 DIAGNOSIS — S60812D Abrasion of left wrist, subsequent encounter: Secondary | ICD-10-CM

## 2017-04-08 DIAGNOSIS — R188 Other ascites: Secondary | ICD-10-CM | POA: Diagnosis present

## 2017-04-08 DIAGNOSIS — S0003XA Contusion of scalp, initial encounter: Secondary | ICD-10-CM | POA: Diagnosis present

## 2017-04-08 DIAGNOSIS — Z800 Family history of malignant neoplasm of digestive organs: Secondary | ICD-10-CM

## 2017-04-08 DIAGNOSIS — Z882 Allergy status to sulfonamides status: Secondary | ICD-10-CM

## 2017-04-08 DIAGNOSIS — N2581 Secondary hyperparathyroidism of renal origin: Secondary | ICD-10-CM | POA: Diagnosis present

## 2017-04-08 DIAGNOSIS — J9601 Acute respiratory failure with hypoxia: Secondary | ICD-10-CM | POA: Diagnosis present

## 2017-04-08 DIAGNOSIS — I959 Hypotension, unspecified: Secondary | ICD-10-CM | POA: Diagnosis present

## 2017-04-08 DIAGNOSIS — Z6825 Body mass index (BMI) 25.0-25.9, adult: Secondary | ICD-10-CM

## 2017-04-08 DIAGNOSIS — Z79899 Other long term (current) drug therapy: Secondary | ICD-10-CM

## 2017-04-08 DIAGNOSIS — Z885 Allergy status to narcotic agent status: Secondary | ICD-10-CM

## 2017-04-08 DIAGNOSIS — W109XXA Fall (on) (from) unspecified stairs and steps, initial encounter: Secondary | ICD-10-CM | POA: Diagnosis present

## 2017-04-08 DIAGNOSIS — I4891 Unspecified atrial fibrillation: Secondary | ICD-10-CM | POA: Diagnosis present

## 2017-04-08 LAB — LACTIC ACID, PLASMA: LACTIC ACID, VENOUS: 2.8 mmol/L — AB (ref 0.5–1.9)

## 2017-04-08 LAB — BASIC METABOLIC PANEL
ANION GAP: 15 (ref 5–15)
BUN: 59 mg/dL — ABNORMAL HIGH (ref 6–20)
CHLORIDE: 94 mmol/L — AB (ref 101–111)
CO2: 23 mmol/L (ref 22–32)
Calcium: 9.3 mg/dL (ref 8.9–10.3)
Creatinine, Ser: 5.88 mg/dL — ABNORMAL HIGH (ref 0.61–1.24)
GFR calc Af Amer: 12 mL/min — ABNORMAL LOW (ref >60)
GFR, EST NON AFRICAN AMERICAN: 10 mL/min — AB (ref >60)
Glucose, Bld: 172 mg/dL — ABNORMAL HIGH (ref 65–99)
POTASSIUM: 5.6 mmol/L — AB (ref 3.5–5.1)
SODIUM: 132 mmol/L — AB (ref 135–145)

## 2017-04-08 LAB — BRAIN NATRIURETIC PEPTIDE: B NATRIURETIC PEPTIDE 5: 1430 pg/mL — AB (ref 0.0–100.0)

## 2017-04-08 LAB — CBC
HCT: 33.6 % — ABNORMAL LOW (ref 40.0–52.0)
HEMOGLOBIN: 10.4 g/dL — AB (ref 13.0–18.0)
MCH: 26.4 pg (ref 26.0–34.0)
MCHC: 30.8 g/dL — ABNORMAL LOW (ref 32.0–36.0)
MCV: 85.7 fL (ref 80.0–100.0)
Platelets: 234 10*3/uL (ref 150–440)
RBC: 3.93 MIL/uL — AB (ref 4.40–5.90)
RDW: 20.2 % — ABNORMAL HIGH (ref 11.5–14.5)
WBC: 7.9 10*3/uL (ref 3.8–10.6)

## 2017-04-08 LAB — TROPONIN I: Troponin I: 0.1 ng/mL (ref <0.03)

## 2017-04-08 LAB — CULTURE, BLOOD (ROUTINE X 2)
Culture: NO GROWTH
Special Requests: ADEQUATE

## 2017-04-08 MED ORDER — PIPERACILLIN-TAZOBACTAM IN DEX 2-0.25 GM/50ML IV SOLN
3.3750 g | Freq: Once | INTRAVENOUS | Status: AC
  Administered 2017-04-09: 3.375 g via INTRAVENOUS
  Filled 2017-04-08: qty 100

## 2017-04-08 MED ORDER — SODIUM CHLORIDE 0.9 % IV BOLUS (SEPSIS)
500.0000 mL | Freq: Once | INTRAVENOUS | Status: AC
  Administered 2017-04-09: 500 mL via INTRAVENOUS

## 2017-04-08 MED ORDER — VANCOMYCIN HCL IN DEXTROSE 1-5 GM/200ML-% IV SOLN
1000.0000 mg | Freq: Once | INTRAVENOUS | Status: AC
  Administered 2017-04-09: 1000 mg via INTRAVENOUS
  Filled 2017-04-08: qty 200

## 2017-04-08 NOTE — ED Triage Notes (Addendum)
Pt presents to ED with extreme fatigue and weakness for the past 2 days. Pt daughter states he has not been acting like himself. [place]ly [place] and was taken to the local hospital for a head CT which was said to be negative. Bruising noted to the back of his head. Unsure of loc at that time per daughter. Pt lethargic in triage and repeatedly needing to be aroused to attempt to evaluate pt. Pt recently admitted to this hospital and treated for pneumonia; released Thursday night. Decrease in oral intake today. Denies chest pain or sob. Reports having a headache.

## 2017-04-08 NOTE — Progress Notes (Signed)
Zosyn 2.25g ordered -- this dose is unavailable. Patient has CrCl < 20 ml/min -- will give a one time 3.375g and if need be will start zosyn 3.375g IV q12h for CrCl < 20 ml/min.  Thomasene Rippleavid Enslee Bibbins, PharmD, BCPS Clinical Pharmacist 04/08/2017

## 2017-04-08 NOTE — ED Provider Notes (Signed)
[facility] Emergency Department Provider Note   ____________________________________________   First MD Initiated Contact with Patient 04/08/17 2336     (approximate)  I have reviewed the triage vital signs and the nursing notes.   HISTORY  Chief Complaint Fall; Head Injury; and Weakness    HPI Juan Roberson. is a 46 y.o. male who comes into the hospital today with pain. The patient reports he fell down the steps today at 11 PM. He was at the beach and reports he was seen in [place]. He states that everything was fine but he is here because his head hurts his back hurts and his tailbone hurts. The patient reports he comes in for pain. The patient denies any fevers or chest pain. He reports that he lost his balance and he did not have a syncopal episode. He reports though that he cannot stay awake. The patient has a history of end-stage renal disease on dialysis and missed dialysis on Saturday. He reports that his last session was on Thursday. The patient takes aspirin reports he was also feeling shortness of breath. He was diagnosed with pneumonia on July 3 and was admitted to the hospital. He had some pleural effusions and had them drained and was discharged with his shortness of breath improving. The patient reports that he is unsure what antibiotics he was discharged with and he is unsure if he's been taking it. He denies any nausea or vomiting or abdominal pain. He reports he is just here for pain   Past Medical History:  Diagnosis Date  . Cellulitis and abscess of foot    Left-Dr. Ether Griffins  . CHF (congestive heart failure) (HCC)    EF 20%  . CKD (chronic kidney disease), stage III   . Diabetes mellitus without complication (HCC)    a. Dx ~ 1996.  [name] Dysrhythmia   . Essential hypertension   . Gastritis    a. 04/2015 hematemesis -> EGD: gastritis, esophagitis, duodenitis.  No active bleeding.  PPI added.  [name] GERD (gastroesophageal reflux disease)     . Left leg DVT (HCC)    a. Dx 05/2015 -> Coumadin.  . MI (myocardial infarction) (HCC)   . Osteomyelitis (HCC)    a. 05/2015 L foot.    Patient Active Problem List   Diagnosis Date Noted  . Pressure injury of skin 04/04/2017  . HCAP (healthcare-associated pneumonia) 04/03/2017  . Chronic combined systolic and diastolic CHF (congestive heart failure) (HCC) 04/03/2017  . HTN (hypertension) 04/03/2017  . GERD (gastroesophageal reflux disease) 04/03/2017  . Hyperkalemia 03/21/2017  . Pleural effusion 01/25/2017  . CHF exacerbation (HCC) 01/14/2017  . CKD (chronic kidney disease), stage III 01/09/2017  . SBP (spontaneous bacterial peritonitis) (HCC) 12/05/2016  . Hyperglycemia 10/28/2016  . Sepsis (HCC) 10/17/2016  . Chest pain 09/24/2016  . Acute-on-chronic kidney injury (HCC) 09/24/2016    Past Surgical History:  Procedure Laterality Date  . CARDIAC CATHETERIZATION Right 10/26/2016   Procedure: Left Heart Cath and Coronary Angiography;  Surgeon: Laurier Nancy, MD;  Location: [facility] INVASIVE CV LAB;  Service: Cardiovascular;  Laterality: Right;  . DIALYSIS/PERMA CATHETER INSERTION N/A 01/17/2017   Procedure: Dialysis/Perma Catheter Insertion;  Surgeon: Annice Needy, MD;  Location: [facility] INVASIVE CV LAB;  Service: Cardiovascular;  Laterality: N/A;  . DIALYSIS/PERMA CATHETER REMOVAL N/A 12/05/2016   Procedure: Dialysis/Perma Catheter Removal;  Surgeon: Renford Dills, MD;  Location: [facility] INVASIVE CV LAB;  Service: Cardiovascular;  Laterality: N/A;  . ESOPHAGOGASTRODUODENOSCOPY  N/A 04/07/2015   Procedure: ESOPHAGOGASTRODUODENOSCOPY (EGD);  Surgeon: Midge Minium, MD;  Location: [facility] ENDOSCOPY;  Service: Endoscopy;  Laterality: N/A;  . ESOPHAGOGASTRODUODENOSCOPY (EGD) WITH PROPOFOL Left 06/30/2015   Procedure: ESOPHAGOGASTRODUODENOSCOPY (EGD) WITH PROPOFOL;  Surgeon: Christena Deem, MD;  Location: [facility] ENDOSCOPY;  Service: Endoscopy;  Laterality: Left;  . FOOT SURGERY    . I&D EXTREMITY Left  04/06/2015   Procedure: IRRIGATION AND DEBRIDEMENT EXTREMITY;  Surgeon: Gwyneth Revels, DPM;  Location: [facility] ORS;  Service: Podiatry;  Laterality: Left;  . IRRIGATION AND DEBRIDEMENT FOOT Left 05/21/2015   Procedure: IRRIGATION AND DEBRIDEMENT FOOT;  Surgeon: Linus Galas, MD;  Location: [facility] ORS;  Service: Podiatry;  Laterality: Left;  . IRRIGATION AND DEBRIDEMENT KNEE Right 10/18/2016   Procedure: IRRIGATION AND DEBRIDEMENT KNEE;  Surgeon: Deeann Saint, MD;  Location: [facility] ORS;  Service: Orthopedics;  Laterality: Right;  . KNEE ARTHROSCOPY  10/18/2016   Procedure: ARTHROSCOPY KNEE;  Surgeon: Deeann Saint, MD;  Location: [facility] ORS;  Service: Orthopedics;;  . PERIPHERAL VASCULAR CATHETERIZATION N/A 10/27/2016   Procedure: Dialysis/Perma Catheter Insertion;  Surgeon: Annice Needy, MD;  Location: [facility] INVASIVE CV LAB;  Service: Cardiovascular;  Laterality: N/A;    Prior to Admission medications   Medication Sig Start Date End Date Taking? Authorizing Provider  amiodarone (PACERONE) 200 MG tablet Take 1 tablet (200 mg total) by mouth daily. 09/25/16  Yes Shaune Pollack, MD  aspirin EC 325 MG tablet Take 325 mg by mouth daily. 03/05/17  Yes [provider]  cefpodoxime Varney Baas) 200 MG tablet Take 1 tablet (200 mg total) by mouth every Tuesday, Thursday, and Saturday at 6 PM. Take in the evening after dialysis- life long 03/10/17  Yes Enid Baas, MD  fluticasone furoate-vilanterol (BREO ELLIPTA) 100-25 MCG/INH AEPB Inhale 1 puff into the lungs daily.   Yes [provider]  furosemide (LASIX) 80 MG tablet Take 80 mg by mouth 2 (two) times daily. 03/21/17  Yes [provider]  insulin aspart (NOVOLOG) 100 UNIT/ML injection Inject 4 Units into the skin 3 (three) times daily with meals. 10/31/16  Yes Enid Baas, MD  methimazole (TAPAZOLE) 10 MG tablet Take 1 tablet (10 mg total) by mouth daily. 07/22/16  Yes Wieting, Richard, MD  midodrine (PROAMATINE) 10 MG tablet Take 1 tablet  (10 mg total) by mouth 3 (three) times daily with meals. 03/24/17  Yes Sainani, Rolly Pancake, MD  pantoprazole (PROTONIX) 40 MG tablet Take 1 tablet (40 mg total) by mouth daily. 09/25/16  Yes Shaune Pollack, MD  sertraline (ZOLOFT) 50 MG tablet Take 50 mg by mouth daily.   Yes [provider]  timolol (TIMOPTIC) 0.5 % ophthalmic solution Place 1 drop into the right eye 2 (two) times daily. 02/16/17  Yes [provider]  traMADol (ULTRAM) 50 MG tablet Take 50 mg by mouth every 6 (six) hours. 02/13/17  Yes [provider]  zolpidem (AMBIEN) 5 MG tablet Take 5 mg by mouth at bedtime as needed for sleep.  11/14/16  Yes [provider]  aspirin EC 81 MG EC tablet Take 1 tablet (81 mg total) by mouth daily. Patient not taking: Reported on 04/09/2017 07/23/16   Alford Highland, MD  multivitamin (RENA-VIT) TABS tablet Take 1 tablet by mouth at bedtime. Patient not taking: Reported on 04/09/2017 01/19/17   Enedina Finner, MD    Allergies Sulfur; Hydrocodone; Oxycodone; and Tramadol  Family History  Problem Relation Age of Onset  . Coronary artery disease Father   . Pancreatic cancer  Mother   . Breast cancer Sister   . Lung cancer Brother   . Cervical cancer Sister     Social History Social History  Substance Use Topics  . Smoking status: Never Smoker  . Smokeless tobacco: Never Used  . Alcohol use No    Review of Systems  Constitutional: fatigue Eyes: No visual changes. ENT: No sore throat. Cardiovascular: Denies chest pain. Respiratory: shortness of breath. Gastrointestinal: No abdominal pain.  No nausea, no vomiting.  No diarrhea.  No constipation. Genitourinary: Negative for dysuria. Musculoskeletal: back pain. Skin: Negative for rash. Neurological: head pain   ____________________________________________   PHYSICAL EXAM:  VITAL SIGNS: ED Triage Vitals  Enc Vitals Group     BP 04/08/17 2031 120/84     Pulse Rate 04/08/17 2031 (!) 105     Resp  04/08/17 2031 20     Temp 04/08/17 2031 99.5 F (37.5 C)     Temp Source 04/08/17 2031 Oral     SpO2 04/08/17 2031 95 %     Weight 04/08/17 2031 182 lb (82.6 kg)     Height 04/08/17 2031 6\' 2"  (1.88 m)     Head Circumference --      Peak Flow --      Pain Score 04/08/17 2030 10     Pain Loc --      Pain Edu? --      Excl. in GC? --     Constitutional: Somnolent  Ill appearing and in moderate distress. Eyes: Conjunctivae are normal. PERRL. EOMI. Head: bruising to the patient's occiput Nose: No congestion/rhinnorhea. Mouth/Throat: Mucous membranes are moist.  Oropharynx non-erythematous. Neck: No cervical spine tenderness to palpation. Cardiovascular: Normal rate, regular rhythm. Grossly normal heart sounds.  Good peripheral circulation. Respiratory: mildly Increased respiratory effort.  No retractions. Crackles in bilateral bases. Gastrointestinal: Soft and nontender. No distention. Positive bowel sounds Musculoskeletal: No lower extremity tenderness nor edema.   Neurologic:  Somnolent but arousable cranial nerves grossly intact although patient has some generalized weakness. Skin:  Skin is warm, abrasions noted to patient's knees, back, right shoulder, hands with some Psychiatric: Mood and affect are normal.   ____________________________________________   LABS (all labs ordered are listed, but only abnormal results are displayed)  Labs Reviewed  BASIC METABOLIC PANEL - Abnormal; Notable for the following:       Result Value   Sodium 132 (*)    Potassium 5.6 (*)    Chloride 94 (*)    Glucose, Bld 172 (*)    BUN 59 (*)    Creatinine, Ser 5.88 (*)    GFR calc non Af Amer 10 (*)    GFR calc Af Amer 12 (*)    All other components within normal limits  CBC - Abnormal; Notable for the following:    RBC 3.93 (*)    Hemoglobin 10.4 (*)    HCT 33.6 (*)    MCHC 30.8 (*)    RDW 20.2 (*)    All other components within normal limits  TROPONIN I - Abnormal; Notable for the  following:    Troponin I 0.10 (*)    All other components within normal limits  LACTIC ACID, PLASMA - Abnormal; Notable for the following:    Lactic Acid, Venous 2.8 (*)    All other components within normal limits  LACTIC ACID, PLASMA - Abnormal; Notable for the following:    Lactic Acid, Venous 2.8 (*)    All other components within normal limits  BRAIN NATRIURETIC PEPTIDE - Abnormal; Notable for the following:    B Natriuretic Peptide 1,430.0 (*)    All other components within normal limits  CULTURE, BLOOD (ROUTINE X 2)  CULTURE, BLOOD (ROUTINE X 2)  CBG MONITORING, ED   ____________________________________________  EKG  ED ECG REPORT I, Rebecka Apley, the attending physician, personally viewed and interpreted this ECG.   Date: 04/08/2017  EKG Time: 2040  Rate: 102  Rhythm: sinus tachycardia, RBBB  Axis: left axis deviation  Intervals:right bundle branch block  ST&T Change: none  ____________________________________________  RADIOLOGY  Dg Shoulder Right  Result Date: 04/09/2017 CLINICAL DATA:  Initial evaluation for acute shoulder pain, recent fall. EXAM: RIGHT SHOULDER - 2+ VIEW COMPARISON:  None. FINDINGS: There is no evidence of fracture or dislocation. There is no evidence of arthropathy or other focal bone abnormality. Soft tissues are unremarkable. IMPRESSION: No acute osseous abnormality about the right shoulder. Electronically Signed   By: Rise Mu M.D.   On: 04/09/2017 00:23   Ct Head Wo Contrast  Result Date: 04/09/2017 CLINICAL DATA:  Fatigue and weakness, fell, headache EXAM: CT HEAD WITHOUT CONTRAST CT CERVICAL SPINE WITHOUT CONTRAST TECHNIQUE: Multidetector CT imaging of the head and cervical spine was performed following the standard protocol without intravenous contrast. Multiplanar CT image reconstructions of the cervical spine were also generated. COMPARISON:  10/07/2014, 06/17/2013, 02/02/2007 FINDINGS: CT HEAD FINDINGS Brain: No acute  territorial infarction, hemorrhage, or intracranial mass is seen. The ventricles are nonenlarged. Vascular: No hyperdense vessels.  Carotid artery calcifications. Skull: No fracture or suspicious bone lesion. Sinuses/Orbits: Mild mucosal thickening in the sinuses. Interval finding of diffuse increased density in the right globe Other: None CT CERVICAL SPINE FINDINGS Alignment: No subluxation is seen. Facet alignment is within normal limits. Skull base and vertebrae: No acute fracture. No primary bone lesion or focal pathologic process. Soft tissues and spinal canal: No prevertebral fluid or swelling. No visible canal hematoma. Disc levels: Mild to moderate degenerative disc changes at C4-C5 and C6-C7 with posterior disc osteophyte at C4-C5. Foraminal narrowing bilaterally at C6-C7. Upper chest: Partially visualized moderate to large pleural effusions. Heterogenous multinodular thyroid gland with possible dominant nodule from the inferior pole on the left. Other: None IMPRESSION: 1. No CT evidence for acute intracranial abnormality. 2. No acute fracture or malalignment of the cervical spine 3. Interval finding of diffuse increased density in the right lobe, correlate for any interval history of right eye surgery 4. Moderate to large pleural effusions at the apices. 5. Heterogenous multinodular appearing thyroid which may be correlated with nonemergent ultrasound Electronically Signed   By: Jasmine Pang M.D.   On: 04/09/2017 00:52   Ct Cervical Spine Wo Contrast  Result Date: 04/09/2017 CLINICAL DATA:  Fatigue and weakness, fell, headache EXAM: CT HEAD WITHOUT CONTRAST CT CERVICAL SPINE WITHOUT CONTRAST TECHNIQUE: Multidetector CT imaging of the head and cervical spine was performed following the standard protocol without intravenous contrast. Multiplanar CT image reconstructions of the cervical spine were also generated. COMPARISON:  10/07/2014, 06/17/2013, 02/02/2007 FINDINGS: CT HEAD FINDINGS Brain: No acute  territorial infarction, hemorrhage, or intracranial mass is seen. The ventricles are nonenlarged. Vascular: No hyperdense vessels.  Carotid artery calcifications. Skull: No fracture or suspicious bone lesion. Sinuses/Orbits: Mild mucosal thickening in the sinuses. Interval finding of diffuse increased density in the right globe Other: None CT CERVICAL SPINE FINDINGS Alignment: No subluxation is seen. Facet alignment is within normal limits. Skull base and vertebrae: No acute fracture. No primary  bone lesion or focal pathologic process. Soft tissues and spinal canal: No prevertebral fluid or swelling. No visible canal hematoma. Disc levels: Mild to moderate degenerative disc changes at C4-C5 and C6-C7 with posterior disc osteophyte at C4-C5. Foraminal narrowing bilaterally at C6-C7. Upper chest: Partially visualized moderate to large pleural effusions. Heterogenous multinodular thyroid gland with possible dominant nodule from the inferior pole on the left. Other: None IMPRESSION: 1. No CT evidence for acute intracranial abnormality. 2. No acute fracture or malalignment of the cervical spine 3. Interval finding of diffuse increased density in the right lobe, correlate for any interval history of right eye surgery 4. Moderate to large pleural effusions at the apices. 5. Heterogenous multinodular appearing thyroid which may be correlated with nonemergent ultrasound Electronically Signed   By: Jasmine PangKim  Fujinaga M.D.   On: 04/09/2017 00:52   Dg Chest Port 1 View  Result Date: 04/08/2017 CLINICAL DATA:  Shortness of breath EXAM: PORTABLE CHEST 1 VIEW COMPARISON:  04/05/2017 FINDINGS: Left-sided central venous catheter tip overlies the SVC. Moderate right pleural effusion, increased compared to prior. Small left-sided pleural effusion grossly unchanged. Bibasilar consolidations slightly worsened on the right. Cardiomegaly with central vascular congestion. No pneumothorax. IMPRESSION: 1. Small left pleural effusion and  adjacent left basilar atelectasis or pneumonia 2. Moderate right pleural effusion, increased compared to prior. Worsening consolidation at the right lung base 3. Cardiomegaly with central vascular congestion Electronically Signed   By: Jasmine PangKim  Fujinaga M.D.   On: 04/08/2017 23:15    ____________________________________________   PROCEDURES  Procedure(s) performed: None  Procedures  Critical Care performed: No  ____________________________________________   INITIAL IMPRESSION / ASSESSMENT AND PLAN / ED COURSE  Pertinent labs & imaging results that were available during my care of the patient were reviewed by me and considered in my medical decision making (see chart for details).  This is a 46 year old male who comes into the hospital today with head pain [place]. The patient had been seen and admitted to the hospital approximately 5 days ago with a pleural effusion. The patient does have a slightly elevated lactic acid and he was hypoxic when he initially arrived. The patient was placed on O2 as we evaluated his blood work. I will repeat the patient's CT scan given the risk of delayed bleeding as the patient is on aspirin. I will also perform an x-ray of his shoulder as he does have a deformity when he moves his shoulder about. The patient does have an elevated troponin which is higher than it was a few months ago. I will give the patient some Zosyn and vancomycin as he does have a larger effusion with a possible pneumonia. I will also give the patient a 500 ml bolus of normal saline. I will readmit him to the hospital     The patient's imaging studies were unremarkable. He will be admitted to the hospitalist service since he has some elevation of his troponin, pleural effusion with possible pneumonia, hypoxia and a lactic acidosis. ____________________________________________   FINAL CLINICAL IMPRESSION(S) / ED DIAGNOSES  Final diagnoses:  Injury  of head, initial encounter  Hypoxia  Pleural effusion  Healthcare-associated pneumonia  Lactic acidosis  Elevated troponin      NEW MEDICATIONS STARTED DURING THIS VISIT:  New Prescriptions   No medications on file     Note:  This document was prepared using Dragon voice recognition software and may include unintentional dictation errors.    Rebecka ApleyWebster, Ezri Landers P, MD  04/09/17 0205  

## 2017-04-08 NOTE — ED Notes (Signed)
Pt lying in bed and oxygen saturation dropped to 83% on RA with good waveform. Pt was instructed to take some deep breaths but saturation did not increase. Pt denies SOB at this time. PT placed on 2L oxygen [place]. Pt reports feeling drowsy and is falling asleep after answering RN questions.

## 2017-04-09 ENCOUNTER — Encounter: Payer: Self-pay | Admitting: Internal Medicine

## 2017-04-09 ENCOUNTER — Emergency Department: Payer: BLUE CROSS/BLUE SHIELD

## 2017-04-09 DIAGNOSIS — L02612 Cutaneous abscess of left foot: Secondary | ICD-10-CM | POA: Diagnosis present

## 2017-04-09 DIAGNOSIS — F329 Major depressive disorder, single episode, unspecified: Secondary | ICD-10-CM | POA: Diagnosis present

## 2017-04-09 DIAGNOSIS — R0902 Hypoxemia: Secondary | ICD-10-CM | POA: Diagnosis present

## 2017-04-09 DIAGNOSIS — M869 Osteomyelitis, unspecified: Secondary | ICD-10-CM | POA: Diagnosis present

## 2017-04-09 DIAGNOSIS — J189 Pneumonia, unspecified organism: Secondary | ICD-10-CM | POA: Diagnosis present

## 2017-04-09 DIAGNOSIS — I5022 Chronic systolic (congestive) heart failure: Secondary | ICD-10-CM | POA: Diagnosis present

## 2017-04-09 DIAGNOSIS — F419 Anxiety disorder, unspecified: Secondary | ICD-10-CM | POA: Diagnosis present

## 2017-04-09 DIAGNOSIS — R188 Other ascites: Secondary | ICD-10-CM | POA: Diagnosis present

## 2017-04-09 DIAGNOSIS — E1142 Type 2 diabetes mellitus with diabetic polyneuropathy: Secondary | ICD-10-CM | POA: Diagnosis present

## 2017-04-09 DIAGNOSIS — Z66 Do not resuscitate: Secondary | ICD-10-CM | POA: Diagnosis present

## 2017-04-09 DIAGNOSIS — D631 Anemia in chronic kidney disease: Secondary | ICD-10-CM | POA: Diagnosis present

## 2017-04-09 DIAGNOSIS — Z7189 Other specified counseling: Secondary | ICD-10-CM | POA: Diagnosis not present

## 2017-04-09 DIAGNOSIS — I4891 Unspecified atrial fibrillation: Secondary | ICD-10-CM | POA: Diagnosis present

## 2017-04-09 DIAGNOSIS — X58XXXD Exposure to other specified factors, subsequent encounter: Secondary | ICD-10-CM | POA: Diagnosis present

## 2017-04-09 DIAGNOSIS — N186 End stage renal disease: Secondary | ICD-10-CM | POA: Diagnosis present

## 2017-04-09 DIAGNOSIS — Z515 Encounter for palliative care: Secondary | ICD-10-CM | POA: Diagnosis not present

## 2017-04-09 DIAGNOSIS — K219 Gastro-esophageal reflux disease without esophagitis: Secondary | ICD-10-CM | POA: Diagnosis present

## 2017-04-09 DIAGNOSIS — I132 Hypertensive heart and chronic kidney disease with heart failure and with stage 5 chronic kidney disease, or end stage renal disease: Secondary | ICD-10-CM | POA: Diagnosis present

## 2017-04-09 DIAGNOSIS — E43 Unspecified severe protein-calorie malnutrition: Secondary | ICD-10-CM | POA: Diagnosis present

## 2017-04-09 DIAGNOSIS — I248 Other forms of acute ischemic heart disease: Secondary | ICD-10-CM | POA: Diagnosis present

## 2017-04-09 DIAGNOSIS — W109XXA Fall (on) (from) unspecified stairs and steps, initial encounter: Secondary | ICD-10-CM | POA: Diagnosis present

## 2017-04-09 DIAGNOSIS — N2581 Secondary hyperparathyroidism of renal origin: Secondary | ICD-10-CM | POA: Diagnosis present

## 2017-04-09 DIAGNOSIS — Y95 Nosocomial condition: Secondary | ICD-10-CM | POA: Diagnosis present

## 2017-04-09 DIAGNOSIS — S0003XA Contusion of scalp, initial encounter: Secondary | ICD-10-CM | POA: Diagnosis present

## 2017-04-09 DIAGNOSIS — J90 Pleural effusion, not elsewhere classified: Secondary | ICD-10-CM | POA: Diagnosis not present

## 2017-04-09 DIAGNOSIS — E0520 Thyrotoxicosis with toxic multinodular goiter without thyrotoxic crisis or storm: Secondary | ICD-10-CM | POA: Diagnosis present

## 2017-04-09 DIAGNOSIS — Z992 Dependence on renal dialysis: Secondary | ICD-10-CM | POA: Diagnosis not present

## 2017-04-09 DIAGNOSIS — E1122 Type 2 diabetes mellitus with diabetic chronic kidney disease: Secondary | ICD-10-CM | POA: Diagnosis present

## 2017-04-09 DIAGNOSIS — R531 Weakness: Secondary | ICD-10-CM | POA: Diagnosis present

## 2017-04-09 DIAGNOSIS — I252 Old myocardial infarction: Secondary | ICD-10-CM | POA: Diagnosis not present

## 2017-04-09 DIAGNOSIS — J9601 Acute respiratory failure with hypoxia: Secondary | ICD-10-CM | POA: Diagnosis present

## 2017-04-09 DIAGNOSIS — E872 Acidosis: Secondary | ICD-10-CM | POA: Diagnosis present

## 2017-04-09 LAB — BASIC METABOLIC PANEL
ANION GAP: 12 (ref 5–15)
BUN: 64 mg/dL — ABNORMAL HIGH (ref 6–20)
CALCIUM: 8.6 mg/dL — AB (ref 8.9–10.3)
CO2: 24 mmol/L (ref 22–32)
Chloride: 95 mmol/L — ABNORMAL LOW (ref 101–111)
Creatinine, Ser: 5.96 mg/dL — ABNORMAL HIGH (ref 0.61–1.24)
GFR, EST AFRICAN AMERICAN: 12 mL/min — AB (ref >60)
GFR, EST NON AFRICAN AMERICAN: 10 mL/min — AB (ref >60)
Glucose, Bld: 183 mg/dL — ABNORMAL HIGH (ref 65–99)
POTASSIUM: 5.2 mmol/L — AB (ref 3.5–5.1)
Sodium: 131 mmol/L — ABNORMAL LOW (ref 135–145)

## 2017-04-09 LAB — CBC
HEMATOCRIT: 29 % — AB (ref 40.0–52.0)
HEMOGLOBIN: 9 g/dL — AB (ref 13.0–18.0)
MCH: 26 pg (ref 26.0–34.0)
MCHC: 31.2 g/dL — ABNORMAL LOW (ref 32.0–36.0)
MCV: 83.3 fL (ref 80.0–100.0)
Platelets: 208 10*3/uL (ref 150–440)
RBC: 3.48 MIL/uL — ABNORMAL LOW (ref 4.40–5.90)
RDW: 19.9 % — ABNORMAL HIGH (ref 11.5–14.5)
WBC: 8.4 10*3/uL (ref 3.8–10.6)

## 2017-04-09 LAB — TROPONIN I
TROPONIN I: 0.09 ng/mL — AB (ref <0.03)
TROPONIN I: 0.08 ng/mL — AB (ref <0.03)
Troponin I: 0.1 ng/mL (ref <0.03)

## 2017-04-09 LAB — LACTIC ACID, PLASMA
LACTIC ACID, VENOUS: 1.3 mmol/L (ref 0.5–1.9)
Lactic Acid, Venous: 2.8 mmol/L (ref 0.5–1.9)

## 2017-04-09 LAB — MRSA PCR SCREENING: MRSA by PCR: POSITIVE — AB

## 2017-04-09 LAB — GLUCOSE, CAPILLARY: Glucose-Capillary: 161 mg/dL — ABNORMAL HIGH (ref 65–99)

## 2017-04-09 MED ORDER — MIDODRINE HCL 2.5 MG PO TABS
10.0000 mg | ORAL_TABLET | Freq: Three times a day (TID) | ORAL | Status: DC
  Administered 2017-04-09 – 2017-04-12 (×10): 10 mg via ORAL
  Filled 2017-04-09 (×2): qty 2
  Filled 2017-04-09 (×4): qty 4
  Filled 2017-04-09 (×2): qty 2
  Filled 2017-04-09 (×3): qty 4
  Filled 2017-04-09 (×2): qty 2
  Filled 2017-04-09: qty 4
  Filled 2017-04-09 (×2): qty 2
  Filled 2017-04-09: qty 4

## 2017-04-09 MED ORDER — PANTOPRAZOLE SODIUM 40 MG PO TBEC
40.0000 mg | DELAYED_RELEASE_TABLET | Freq: Every day | ORAL | Status: DC
  Administered 2017-04-09 – 2017-04-12 (×4): 40 mg via ORAL
  Filled 2017-04-09 (×4): qty 1

## 2017-04-09 MED ORDER — DEXTROSE 5 % IV SOLN
1.0000 g | INTRAVENOUS | Status: DC
  Administered 2017-04-09 – 2017-04-11 (×2): 1 g via INTRAVENOUS
  Filled 2017-04-09 (×4): qty 1

## 2017-04-09 MED ORDER — ONDANSETRON HCL 4 MG/2ML IJ SOLN
4.0000 mg | Freq: Four times a day (QID) | INTRAMUSCULAR | Status: DC | PRN
  Administered 2017-04-12: 4 mg via INTRAVENOUS
  Filled 2017-04-09: qty 2

## 2017-04-09 MED ORDER — CHLORHEXIDINE GLUCONATE CLOTH 2 % EX PADS
6.0000 | MEDICATED_PAD | Freq: Every day | CUTANEOUS | Status: DC
  Administered 2017-04-10 – 2017-04-12 (×3): 6 via TOPICAL

## 2017-04-09 MED ORDER — SERTRALINE HCL 50 MG PO TABS
50.0000 mg | ORAL_TABLET | Freq: Every day | ORAL | Status: DC
  Administered 2017-04-09: 50 mg via ORAL
  Filled 2017-04-09: qty 1

## 2017-04-09 MED ORDER — FUROSEMIDE 10 MG/ML IJ SOLN
40.0000 mg | Freq: Two times a day (BID) | INTRAMUSCULAR | Status: DC
  Administered 2017-04-09 – 2017-04-12 (×6): 40 mg via INTRAVENOUS
  Filled 2017-04-09 (×6): qty 4

## 2017-04-09 MED ORDER — METHIMAZOLE 5 MG PO TABS
10.0000 mg | ORAL_TABLET | Freq: Every day | ORAL | Status: DC
  Administered 2017-04-09 – 2017-04-12 (×4): 10 mg via ORAL
  Filled 2017-04-09 (×3): qty 2
  Filled 2017-04-09: qty 1

## 2017-04-09 MED ORDER — ALBUMIN HUMAN 25 % IV SOLN
12.5000 g | Freq: Once | INTRAVENOUS | Status: DC
  Filled 2017-04-09 (×2): qty 50

## 2017-04-09 MED ORDER — TIMOLOL MALEATE 0.5 % OP SOLN
1.0000 [drp] | Freq: Two times a day (BID) | OPHTHALMIC | Status: DC
  Administered 2017-04-09 – 2017-04-13 (×8): 1 [drp] via OPHTHALMIC
  Filled 2017-04-09 (×2): qty 5

## 2017-04-09 MED ORDER — SODIUM CHLORIDE 0.9% FLUSH
3.0000 mL | Freq: Two times a day (BID) | INTRAVENOUS | Status: DC
  Administered 2017-04-09 – 2017-04-12 (×8): 3 mL via INTRAVENOUS
  Administered 2017-04-13: 10 mL via INTRAVENOUS

## 2017-04-09 MED ORDER — ACETAMINOPHEN 650 MG RE SUPP: 650.0000 mg | Freq: Four times a day (QID) | RECTAL | Status: DC | PRN

## 2017-04-09 MED ORDER — AMIODARONE HCL 200 MG PO TABS
200.0000 mg | ORAL_TABLET | Freq: Every day | ORAL | Status: DC
  Administered 2017-04-09 – 2017-04-12 (×4): 200 mg via ORAL
  Filled 2017-04-09 (×4): qty 1

## 2017-04-09 MED ORDER — MORPHINE SULFATE (PF) 2 MG/ML IV SOLN
2.0000 mg | Freq: Once | INTRAVENOUS | Status: AC
Start: 2017-04-09 — End: 2017-04-09
  Administered 2017-04-09: 2 mg via INTRAVENOUS

## 2017-04-09 MED ORDER — ACETAMINOPHEN 325 MG PO TABS
650.0000 mg | ORAL_TABLET | Freq: Four times a day (QID) | ORAL | Status: DC | PRN
  Administered 2017-04-10 – 2017-04-11 (×2): 650 mg via ORAL
  Filled 2017-04-09 (×2): qty 2

## 2017-04-09 MED ORDER — SODIUM CHLORIDE 0.9 % IV SOLN: 250.0000 mL | INTRAVENOUS | Status: DC | PRN

## 2017-04-09 MED ORDER — VANCOMYCIN HCL IN DEXTROSE 1-5 GM/200ML-% IV SOLN
1000.0000 mg | INTRAVENOUS | Status: DC
Start: 2017-04-10 — End: 2017-04-12
  Administered 2017-04-10: 1000 mg via INTRAVENOUS
  Filled 2017-04-09 (×4): qty 200

## 2017-04-09 MED ORDER — PIPERACILLIN-TAZOBACTAM 3.375 G IVPB
INTRAVENOUS | Status: AC
  Administered 2017-04-09: 3.375 g
  Filled 2017-04-09: qty 50

## 2017-04-09 MED ORDER — PREMIER PROTEIN SHAKE: 11.0000 [oz_av] | Freq: Two times a day (BID) | ORAL | Status: DC

## 2017-04-09 MED ORDER — PIPERACILLIN-TAZOBACTAM 3.375 G IVPB: 3.3750 g | Freq: Two times a day (BID) | INTRAVENOUS | Status: DC

## 2017-04-09 MED ORDER — ASPIRIN EC 325 MG PO TBEC
325.0000 mg | DELAYED_RELEASE_TABLET | Freq: Every day | ORAL | Status: DC
  Administered 2017-04-09 – 2017-04-12 (×4): 325 mg via ORAL
  Filled 2017-04-09 (×4): qty 1

## 2017-04-09 MED ORDER — PREMIER PROTEIN SHAKE
11.0000 [oz_av] | ORAL | Status: DC
  Administered 2017-04-12: 11 [oz_av] via ORAL

## 2017-04-09 MED ORDER — RENA-VITE PO TABS
1.0000 | ORAL_TABLET | Freq: Every day | ORAL | Status: DC
  Administered 2017-04-09 – 2017-04-11 (×3): 1 via ORAL
  Filled 2017-04-09 (×5): qty 1

## 2017-04-09 MED ORDER — ONDANSETRON HCL 4 MG PO TABS: 4.0000 mg | ORAL_TABLET | Freq: Four times a day (QID) | ORAL | Status: DC | PRN

## 2017-04-09 MED ORDER — ZOLPIDEM TARTRATE 5 MG PO TABS: 5.0000 mg | ORAL_TABLET | Freq: Every evening | ORAL | Status: DC | PRN

## 2017-04-09 MED ORDER — NEPRO/CARBSTEADY PO LIQD: 237.0000 mL | Freq: Three times a day (TID) | ORAL | Status: DC

## 2017-04-09 MED ORDER — SODIUM CHLORIDE 0.9% FLUSH: 3.0000 mL | INTRAVENOUS | Status: DC | PRN

## 2017-04-09 MED ORDER — HEPARIN SODIUM (PORCINE) 5000 UNIT/ML IJ SOLN
5000.0000 [IU] | Freq: Three times a day (TID) | INTRAMUSCULAR | Status: DC
  Administered 2017-04-09 – 2017-04-12 (×11): 5000 [IU] via SUBCUTANEOUS
  Filled 2017-04-09 (×11): qty 1

## 2017-04-09 MED ORDER — FLUTICASONE FUROATE-VILANTEROL 100-25 MCG/INH IN AEPB
1.0000 | INHALATION_SPRAY | Freq: Every day | RESPIRATORY_TRACT | Status: DC
  Administered 2017-04-09 – 2017-04-12 (×4): 1 via RESPIRATORY_TRACT
  Filled 2017-04-09 (×2): qty 28

## 2017-04-09 MED ORDER — MORPHINE SULFATE (PF) 2 MG/ML IV SOLN
INTRAVENOUS | Status: AC
  Filled 2017-04-09: qty 1

## 2017-04-09 MED ORDER — MUPIROCIN 2 % EX OINT
1.0000 "application " | TOPICAL_OINTMENT | Freq: Two times a day (BID) | CUTANEOUS | Status: DC
  Administered 2017-04-09 – 2017-04-12 (×7): 1 via NASAL
  Filled 2017-04-09 (×2): qty 22

## 2017-04-09 MED ORDER — SENNOSIDES-DOCUSATE SODIUM 8.6-50 MG PO TABS: 1.0000 | ORAL_TABLET | Freq: Every evening | ORAL | Status: DC | PRN

## 2017-04-09 MED ORDER — SODIUM POLYSTYRENE SULFONATE 15 GM/60ML PO SUSP
15.0000 g | Freq: Once | ORAL | Status: AC
  Administered 2017-04-09: 15 g via ORAL
  Filled 2017-04-09: qty 60

## 2017-04-09 MED ORDER — NEPRO/CARBSTEADY PO LIQD
237.0000 mL | Freq: Two times a day (BID) | ORAL | Status: DC
  Administered 2017-04-10 – 2017-04-11 (×3): 237 mL via ORAL

## 2017-04-09 NOTE — H&P (Signed)
[facility] - [place] [facility]   PATIENT NAME: Juan Roberson    MR#:  [id number]  DATE OF BIRTH:  [birth year]  DATE OF ADMISSION:  04/08/2017  PRIMARY CARE PHYSICIAN: Corky Downs, MD   REQUESTING/REFERRING PHYSICIAN:   CHIEF COMPLAINT:   Chief Complaint  Patient presents with  . Fall  . Head Injury  . Weakness    HISTORY OF PRESENT ILLNESS: Juan Roberson  is a 46 y.o. male with a known history of End-stage renal disease on dialysis congestive heart failure with EF of 20%, hypertension, osteomyelitis, myocardial infarction went to the beach at [place]. Yesterday he fell down and hit the back of his head when he was walking down steps. No loss of consciousness. Patient has abrasion over the back of the head and felt weak and presented to the emergency room. Was worked up with a CT head and CT cervical spine which showed no acute abnormality. Patient missed dialysis on Saturday. He usually gets dialyzed on Tuesday Thursday and Saturday. He complains of shortness of breath and was hypoxic in the emergency room was put on Lanoxin by nasal cannula. He was worked up with chest x-ray which showed a pneumonia and pleural effusion with fluid overload bedside lactic acid level was elevated and patient was given broad-spectrum antibiotics IV vancomycin and cefepime. Hospitalist service was consulted for the care of the patient.  PAST MEDICAL HISTORY:   Past Medical History:  Diagnosis Date  . Cellulitis and abscess of foot    Left-Dr. Ether Griffins  . CHF (congestive heart failure) (HCC)    EF 20%  . CKD (chronic kidney disease), stage III   . Diabetes mellitus without complication (HCC)    a. Dx ~ 1996.  [name] Dysrhythmia   . Essential hypertension   . Gastritis    a. 04/2015 hematemesis -> EGD: gastritis, esophagitis, duodenitis.  No active bleeding.  PPI added.  [name] GERD (gastroesophageal reflux disease)   . Left leg DVT (HCC)    a. Dx 05/2015 -> Coumadin.  . MI  (myocardial infarction) (HCC)   . Osteomyelitis (HCC)    a. 05/2015 L foot.    PAST SURGICAL HISTORY: Past Surgical History:  Procedure Laterality Date  . CARDIAC CATHETERIZATION Right 10/26/2016   Procedure: Left Heart Cath and Coronary Angiography;  Surgeon: Laurier Nancy, MD;  Location: [facility] INVASIVE CV LAB;  Service: Cardiovascular;  Laterality: Right;  . DIALYSIS/PERMA CATHETER INSERTION N/A 01/17/2017   Procedure: Dialysis/Perma Catheter Insertion;  Surgeon: Annice Needy, MD;  Location: [facility] INVASIVE CV LAB;  Service: Cardiovascular;  Laterality: N/A;  . DIALYSIS/PERMA CATHETER REMOVAL N/A 12/05/2016   Procedure: Dialysis/Perma Catheter Removal;  Surgeon: Renford Dills, MD;  Location: [facility] INVASIVE CV LAB;  Service: Cardiovascular;  Laterality: N/A;  . ESOPHAGOGASTRODUODENOSCOPY N/A 04/07/2015   Procedure: ESOPHAGOGASTRODUODENOSCOPY (EGD);  Surgeon: Midge Minium, MD;  Location: [facility] ENDOSCOPY;  Service: Endoscopy;  Laterality: N/A;  . ESOPHAGOGASTRODUODENOSCOPY (EGD) WITH PROPOFOL Left 06/30/2015   Procedure: ESOPHAGOGASTRODUODENOSCOPY (EGD) WITH PROPOFOL;  Surgeon: Christena Deem, MD;  Location: [facility] ENDOSCOPY;  Service: Endoscopy;  Laterality: Left;  . FOOT SURGERY    . I&D EXTREMITY Left 04/06/2015   Procedure: IRRIGATION AND DEBRIDEMENT EXTREMITY;  Surgeon: Gwyneth Revels, DPM;  Location: [facility] ORS;  Service: Podiatry;  Laterality: Left;  . IRRIGATION AND DEBRIDEMENT FOOT Left 05/21/2015   Procedure: IRRIGATION AND DEBRIDEMENT FOOT;  Surgeon: Linus Galas, MD;  Location: [facility] ORS;  Service: Podiatry;  Laterality: Left;  . IRRIGATION  AND DEBRIDEMENT KNEE Right 10/18/2016   Procedure: IRRIGATION AND DEBRIDEMENT KNEE;  Surgeon: Deeann Saint, MD;  Location: [facility] ORS;  Service: Orthopedics;  Laterality: Right;  . KNEE ARTHROSCOPY  10/18/2016   Procedure: ARTHROSCOPY KNEE;  Surgeon: Deeann Saint, MD;  Location: [facility] ORS;  Service: Orthopedics;;  . PERIPHERAL VASCULAR CATHETERIZATION N/A  10/27/2016   Procedure: Dialysis/Perma Catheter Insertion;  Surgeon: Annice Needy, MD;  Location: [facility] INVASIVE CV LAB;  Service: Cardiovascular;  Laterality: N/A;    SOCIAL HISTORY:  Social History  Substance Use Topics  . Smoking status: Never Smoker  . Smokeless tobacco: Never Used  . Alcohol use No    FAMILY HISTORY:  Family History  Problem Relation Age of Onset  . Coronary artery disease Father   . Pancreatic cancer Mother   . Breast cancer Sister   . Lung cancer Brother   . Cervical cancer Sister     DRUG ALLERGIES:  Allergies  Allergen Reactions  . Sulfur Other (See Comments)    Pt states that this medication causes renal failure.    . Hydrocodone Itching  . Oxycodone Itching  . Tramadol Itching    REVIEW OF SYSTEMS:   CONSTITUTIONAL: No fever, has weakness.  EYES: No blurred or double vision.  EARS, NOSE, AND THROAT: No tinnitus or ear pain.  RESPIRATORY: Has occasional cough, shortness of breath,  No wheezing or hemoptysis.  CARDIOVASCULAR: No chest pain, orthopnea, edema.  GASTROINTESTINAL: No nausea, vomiting, diarrhea or abdominal pain.  GENITOURINARY: No dysuria, hematuria.  ENDOCRINE: No polyuria, nocturia,  HEMATOLOGY: No anemia, easy bruising or bleeding SKIN:Has abrasion on scalp occipital area Abrasion of skin over dorsum of left wrist and hand MUSCULOSKELETAL: No joint pain or arthritis.   NEUROLOGIC: No tingling, numbness, weakness.  PSYCHIATRY: No anxiety or depression.   MEDICATIONS AT HOME:  Prior to Admission medications   Medication Sig Start Date End Date Taking? Authorizing Provider  amiodarone (PACERONE) 200 MG tablet Take 1 tablet (200 mg total) by mouth daily. 09/25/16  Yes Shaune Pollack, MD  aspirin EC 325 MG tablet Take 325 mg by mouth daily. 03/05/17  Yes [provider]  cefpodoxime Varney Baas) 200 MG tablet Take 1 tablet (200 mg total) by mouth every Tuesday, Thursday, and Saturday at 6 PM. Take in the evening after dialysis-  life long 03/10/17  Yes Enid Baas, MD  fluticasone furoate-vilanterol (BREO ELLIPTA) 100-25 MCG/INH AEPB Inhale 1 puff into the lungs daily.   Yes [provider]  furosemide (LASIX) 80 MG tablet Take 80 mg by mouth 2 (two) times daily. 03/21/17  Yes [provider]  insulin aspart (NOVOLOG) 100 UNIT/ML injection Inject 4 Units into the skin 3 (three) times daily with meals. 10/31/16  Yes Enid Baas, MD  methimazole (TAPAZOLE) 10 MG tablet Take 1 tablet (10 mg total) by mouth daily. 07/22/16  Yes Wieting, Richard, MD  midodrine (PROAMATINE) 10 MG tablet Take 1 tablet (10 mg total) by mouth 3 (three) times daily with meals. 03/24/17  Yes Sainani, Rolly Pancake, MD  pantoprazole (PROTONIX) 40 MG tablet Take 1 tablet (40 mg total) by mouth daily. 09/25/16  Yes Shaune Pollack, MD  sertraline (ZOLOFT) 50 MG tablet Take 50 mg by mouth daily.   Yes [provider]  timolol (TIMOPTIC) 0.5 % ophthalmic solution Place 1 drop into the right eye 2 (two) times daily. 02/16/17  Yes [provider]  traMADol (ULTRAM) 50 MG tablet Take 50 mg by mouth every 6 (six) hours. 02/13/17  Yes [provider]  zolpidem (AMBIEN) 5 MG tablet Take 5 mg by mouth at bedtime as needed for sleep.  11/14/16  Yes [provider]  aspirin EC 81 MG EC tablet Take 1 tablet (81 mg total) by mouth daily. Patient not taking: Reported on 04/09/2017 07/23/16   Alford Highland, MD  multivitamin (RENA-VIT) TABS tablet Take 1 tablet by mouth at bedtime. Patient not taking: Reported on 04/09/2017 01/19/17   Enedina Finner, MD      PHYSICAL EXAMINATION:   VITAL SIGNS: Blood pressure 134/89, pulse 98, temperature 99.5 F (37.5 C), temperature source Oral, resp. rate (!) 21, height 6\' 2"  (1.88 m), weight 82.6 kg (182 lb), SpO2 95 %.  GENERAL:  46 y.o.-year-old patient lying in the bed with no acute distress.  EYES: Pupils equal, round, reactive to light and accommodation. No scleral icterus.  Extraocular muscles intact.  HEENT: Abrasion over occipital area, normocephalic. Oropharynx and nasopharynx clear.  NECK:  Supple, no jugular venous distention. No thyroid enlargement, no tenderness.  LUNGS: Decreased breath sounds bilaterally, bibasilar crepitations heard. No use of accessory muscles of respiration.  CARDIOVASCULAR: S1, S2 normal. No murmurs, rubs, or gallops.  ABDOMEN: Soft, nontender, nondistended. Bowel sounds present. No organomegaly or mass.  EXTREMITIES: No pedal edema, cyanosis, or clubbing.  NEUROLOGIC: Cranial nerves II through XII are intact. Muscle strength 5/5 in all extremities. Sensation intact. Gait not checked.  PSYCHIATRIC: The patient is alert and oriented x 3.  SKIN: Abrasion over occipital area and left wrist and dorsum of hand  LABORATORY PANEL:   CBC  Recent Labs Lab 04/03/17 2148 04/04/17 0530 04/05/17 1134 04/08/17 2111  WBC 5.6 5.8 5.2 7.9  HGB 8.8* 8.8* 8.5* 10.4*  HCT 28.0* 28.2* 27.9* 33.6*  PLT 240 214 217 234  MCV 85.2 84.4 84.9 85.7  MCH 26.8 26.3 26.0 26.4  MCHC 31.5* 31.2* 30.6* 30.8*  RDW 20.6* 20.1* 19.9* 20.2*  LYMPHSABS 1.0  --   --   --   MONOABS 0.6  --   --   --   EOSABS 0.1  --   --   --   BASOSABS 0.0  --   --   --    ------------------------------------------------------------------------------------------------------------------  Chemistries   Recent Labs Lab 04/03/17 2148 04/04/17 0530 04/05/17 1134 04/08/17 2111  NA 137 133* 131* 132*  K 3.7 3.9 4.5 5.6*  CL 98* 98* 94* 94*  CO2 28 30 26 23   GLUCOSE 153* 196* 131* 172*  BUN 35* 41* 48* 59*  CREATININE 2.47* 2.74* 3.63* 5.88*  CALCIUM 8.3* 8.3* 8.8* 9.3  AST 23  --   --   --   ALT 14*  --   --   --   ALKPHOS 88  --   --   --   BILITOT 1.1  --   --   --    ------------------------------------------------------------------------------------------------------------------ estimated creatinine clearance is 17.9 mL/min (A) (by C-G formula based on SCr  of 5.88 mg/dL (H)). ------------------------------------------------------------------------------------------------------------------ No results for input(s): TSH, T4TOTAL, T3FREE, THYROIDAB in the last 72 hours.  Invalid input(s): FREET3   Coagulation profile  Recent Labs Lab 04/03/17 2148  INR 1.33   ------------------------------------------------------------------------------------------------------------------- No results for input(s): DDIMER in the last 72 hours. -------------------------------------------------------------------------------------------------------------------  Cardiac Enzymes  Recent Labs Lab 04/08/17 2111  TROPONINI 0.10*   ------------------------------------------------------------------------------------------------------------------ Invalid input(s): POCBNP  ---------------------------------------------------------------------------------------------------------------  Urinalysis    Component Value Date/Time   COLORURINE AMBER (A) 04/03/2017 1620   APPEARANCEUR CLOUDY (A) 04/03/2017  1620   APPEARANCEUR Clear 07/26/2014 1920   LABSPEC 1.020 04/03/2017 1620   LABSPEC 1.012 07/26/2014 1920   PHURINE 5.0 04/03/2017 1620   GLUCOSEU 50 (A) 04/03/2017 1620   GLUCOSEU Negative 07/26/2014 1920   HGBUR NEGATIVE 04/03/2017 1620   BILIRUBINUR SMALL (A) 04/03/2017 1620   BILIRUBINUR Negative 07/26/2014 1920   KETONESUR NEGATIVE 04/03/2017 1620   PROTEINUR 100 (A) 04/03/2017 1620   NITRITE NEGATIVE 04/03/2017 1620   LEUKOCYTESUR TRACE (A) 04/03/2017 1620   LEUKOCYTESUR Negative 07/26/2014 1920     RADIOLOGY: Dg Shoulder Right  Result Date: 04/09/2017 CLINICAL DATA:  Initial evaluation for acute shoulder pain, recent fall. EXAM: RIGHT SHOULDER - 2+ VIEW COMPARISON:  None. FINDINGS: There is no evidence of fracture or dislocation. There is no evidence of arthropathy or other focal bone abnormality. Soft tissues are unremarkable. IMPRESSION: No acute  osseous abnormality about the right shoulder. Electronically Signed   By: Rise Mu M.D.   On: 04/09/2017 00:23   Ct Head Wo Contrast  Result Date: 04/09/2017 CLINICAL DATA:  Fatigue and weakness, fell, headache EXAM: CT HEAD WITHOUT CONTRAST CT CERVICAL SPINE WITHOUT CONTRAST TECHNIQUE: Multidetector CT imaging of the head and cervical spine was performed following the standard protocol without intravenous contrast. Multiplanar CT image reconstructions of the cervical spine were also generated. COMPARISON:  10/07/2014, 06/17/2013, 02/02/2007 FINDINGS: CT HEAD FINDINGS Brain: No acute territorial infarction, hemorrhage, or intracranial mass is seen. The ventricles are nonenlarged. Vascular: No hyperdense vessels.  Carotid artery calcifications. Skull: No fracture or suspicious bone lesion. Sinuses/Orbits: Mild mucosal thickening in the sinuses. Interval finding of diffuse increased density in the right globe Other: None CT CERVICAL SPINE FINDINGS Alignment: No subluxation is seen. Facet alignment is within normal limits. Skull base and vertebrae: No acute fracture. No primary bone lesion or focal pathologic process. Soft tissues and spinal canal: No prevertebral fluid or swelling. No visible canal hematoma. Disc levels: Mild to moderate degenerative disc changes at C4-C5 and C6-C7 with posterior disc osteophyte at C4-C5. Foraminal narrowing bilaterally at C6-C7. Upper chest: Partially visualized moderate to large pleural effusions. Heterogenous multinodular thyroid gland with possible dominant nodule from the inferior pole on the left. Other: None IMPRESSION: 1. No CT evidence for acute intracranial abnormality. 2. No acute fracture or malalignment of the cervical spine 3. Interval finding of diffuse increased density in the right lobe, correlate for any interval history of right eye surgery 4. Moderate to large pleural effusions at the apices. 5. Heterogenous multinodular appearing thyroid which may  be correlated with nonemergent ultrasound Electronically Signed   By: Jasmine Pang M.D.   On: 04/09/2017 00:52   Ct Cervical Spine Wo Contrast  Result Date: 04/09/2017 CLINICAL DATA:  Fatigue and weakness, fell, headache EXAM: CT HEAD WITHOUT CONTRAST CT CERVICAL SPINE WITHOUT CONTRAST TECHNIQUE: Multidetector CT imaging of the head and cervical spine was performed following the standard protocol without intravenous contrast. Multiplanar CT image reconstructions of the cervical spine were also generated. COMPARISON:  10/07/2014, 06/17/2013, 02/02/2007 FINDINGS: CT HEAD FINDINGS Brain: No acute territorial infarction, hemorrhage, or intracranial mass is seen. The ventricles are nonenlarged. Vascular: No hyperdense vessels.  Carotid artery calcifications. Skull: No fracture or suspicious bone lesion. Sinuses/Orbits: Mild mucosal thickening in the sinuses. Interval finding of diffuse increased density in the right globe Other: None CT CERVICAL SPINE FINDINGS Alignment: No subluxation is seen. Facet alignment is within normal limits. Skull base and vertebrae: No acute fracture. No primary bone lesion or focal pathologic process.  Soft tissues and spinal canal: No prevertebral fluid or swelling. No visible canal hematoma. Disc levels: Mild to moderate degenerative disc changes at C4-C5 and C6-C7 with posterior disc osteophyte at C4-C5. Foraminal narrowing bilaterally at C6-C7. Upper chest: Partially visualized moderate to large pleural effusions. Heterogenous multinodular thyroid gland with possible dominant nodule from the inferior pole on the left. Other: None IMPRESSION: 1. No CT evidence for acute intracranial abnormality. 2. No acute fracture or malalignment of the cervical spine 3. Interval finding of diffuse increased density in the right lobe, correlate for any interval history of right eye surgery 4. Moderate to large pleural effusions at the apices. 5. Heterogenous multinodular appearing thyroid which may be  correlated with nonemergent ultrasound Electronically Signed   By: Jasmine PangKim  Fujinaga M.D.   On: 04/09/2017 00:52   Dg Chest Port 1 View  Result Date: 04/08/2017 CLINICAL DATA:  Shortness of breath EXAM: PORTABLE CHEST 1 VIEW COMPARISON:  04/05/2017 FINDINGS: Left-sided central venous catheter tip overlies the SVC. Moderate right pleural effusion, increased compared to prior. Small left-sided pleural effusion grossly unchanged. Bibasilar consolidations slightly worsened on the right. Cardiomegaly with central vascular congestion. No pneumothorax. IMPRESSION: 1. Small left pleural effusion and adjacent left basilar atelectasis or pneumonia 2. Moderate right pleural effusion, increased compared to prior. Worsening consolidation at the right lung base 3. Cardiomegaly with central vascular congestion Electronically Signed   By: Jasmine PangKim  Fujinaga M.D.   On: 04/08/2017 23:15    EKG: Orders placed or performed during the hospital encounter of 04/08/17  . ED EKG  . ED EKG    IMPRESSION AND PLAN: 46 year old male patient with history of end-stage renal disease on dialysis, congestive heart failure, myocardial infarction, hypertension presented to the emergency room with fall and shortness of breath. Admitting diagnosis 1. Bilateral pleural effusion 2. Healthcare associated pneumonia 3. Abnormal troponin could be secondary to demand ischemia 4. Hypoxia 5. End-stage renal disease on dialysis Treatment plan Mid patient to stepdown unit Start patient on IV vancomycin and IV cefepime antibiotic Nephrology consultation Cycle troponin Oxygen via nasal cannula Monitor blood pressure closely Follow-up cultures Intensivist consultation Check echocardiogram DVT prophylaxis with subcutaneous heparin IV Lasix for diuresis if blood pressure permits  All the records are reviewed and case discussed with ED provider. Management plans discussed with the patient, family and they are in agreement.  CODE STATUS:FULL  CODE Code Status History    Date Active Date Inactive Code Status Order ID Comments User Context   04/04/2017 12:57 AM 04/05/2017  8:11 PM Full Code [id number]  Oralia ManisWillis, David, MD Inpatient   03/21/2017  4:43 PM 03/25/2017  3:15 PM Full Code [id number]  Enid BaasKalisetti, Radhika, MD Inpatient   03/07/2017 11:09 PM 03/09/2017  6:50 PM Full Code [id number]  Hugelmeyer, Alexis, DO Inpatient   02/06/2017  1:55 PM 02/09/2017  6:54 PM Full Code [id number]  [place] SirenSainani, Vivek J, MD ED   01/25/2017  1:08 PM 01/27/2017  6:10 PM Full Code [id number]  [place] SirenSainani, Vivek J, MD Inpatient   01/14/2017  6:18 AM 01/19/2017  9:31 PM Full Code [id number]  Ihor AustinPyreddy, Vieno Tarrant, MD Inpatient   12/05/2016  8:01 PM 12/08/2016  3:11 PM Full Code [id number]  Altamese DillingVachhani, Vaibhavkumar, MD Inpatient   10/28/2016  8:42 PM 10/31/2016  8:35 PM Full Code [id number]  [place] SirenSainani, Vivek J, MD Inpatient   10/17/2016  8:18 PM 10/27/2016  6:48 PM Full Code [id number]  Auburn BilberryPatel, Shreyang, MD Inpatient   09/24/2016  3:36 PM 09/25/2016  2:15 PM Full Code  [id number]  Wyatt Haste, MD ED   07/21/2016 11:05 AM 07/22/2016  3:11 PM Full Code [id number]  Alford Highland, MD ED   02/28/2016  1:43 AM 03/01/2016  6:06 PM Full Code [id number]  Arnaldo Natal, MD Inpatient   12/06/2015  8:39 PM 12/08/2015  2:51 PM Full Code [id number]  Altamese Dilling, MD Inpatient   11/01/2015  4:00 PM 11/03/2015  2:28 PM Full Code [id number]  Hower, Cletis Athens, MD ED   06/29/2015  2:30 AM 07/01/2015  2:21 PM Full Code [id number]  Arnaldo Natal, MD Inpatient   05/21/2015  4:34 PM 05/25/2015  4:34 PM Full Code [id number]  Linus Galas, MD Inpatient   05/18/2015  9:17 PM 05/21/2015  4:34 PM Full Code [id number]  Shaune Pollack, MD Inpatient   04/05/2015  7:05 AM 04/08/2015  2:00 PM Full Code [id number]  Arnaldo Natal, MD Inpatient       TOTAL TIME TAKING CARE OF THIS PATIENT: 60 minutes.    Ihor Austin M.D on 04/09/2017 at 2:52 AM  Between 7am to 6pm - Pager - [phone]  After 6pm go to [email or web address] - password [email or web address]  [facility]  [facility]  Office  [phone]  CC: Primary care physician; Corky Downs, MD

## 2017-04-09 NOTE — Progress Notes (Signed)
[facility]  ROUNDING NOTE   Subjective:    Patient is very lethargic today therefore limited information is available.  According to chart, patient presents for extreme fatigue and weakness for the past 2 days prior to admission.  According to patient's daughter, he had not been acting like himself.  He fell down concrete stairs while at the beach and was taken to the local hospital where a head CT was negative.  He had bruising to the back of his head. According to outpatient information, he missed his treatment on Saturday July 7 He is very lethargic. Continues to have edema over abdomen and legs Denies any difficulty breathing.  Currently on nasal cannula oxygen.     Objective:  Vital signs in last 24 hours:  Temp:  [97.7 F (36.5 C)-99.5 F (37.5 C)] 98 F (36.7 C) (07/09 0741) Pulse Rate:  [86-105] 86 (07/09 1135) Resp:  [14-21] 17 (07/09 1135) BP: (91-136)/(63-97) 102/77 (07/09 1135) SpO2:  [80 %-100 %] 100 % (07/09 1135) Weight:  [82.6 kg (182 lb)-88.5 kg (195 lb 3.2 oz)] 88.5 kg (195 lb 3.2 oz) (07/09 0557)  Weight change:  Filed Weights   04/08/17 2031 04/09/17 0557  Weight: 82.6 kg (182 lb) 88.5 kg (195 lb 3.2 oz)    Intake/Output: I/O last 3 completed shifts: In: 750 [IV Piggyback:750] Out: -    Intake/Output this shift:  Total I/O In: 250 [P.O.:200; IV Piggyback:50] Out: 0   Physical Exam: General: No acute distress  Head: Normocephalic, atraumatic. Moist oral mucosal membranes  Eyes: Anicteric  Neck: Supple,    Lungs:  Clear shallow breathing, nasal cannula oxygen  Heart: S1S2 no rubs, irregular  Abdomen:  Soft, nontender, distended, ascites  Extremities: ++ edema.  Neurologic: Lethargic, answers only a few questions  Skin: No lesions  Access: L IJ permcath    Basic Metabolic Panel:  Recent Labs Lab 04/03/17 2148 04/04/17 0530 04/05/17 1134 04/08/17 2111 04/09/17 0604  NA 137 133* 131* 132* 131*  K 3.7 3.9 4.5 5.6* 5.2*  CL  98* 98* 94* 94* 95*  CO2 28 30 26 23 24   GLUCOSE 153* 196* 131* 172* 183*  BUN 35* 41* 48* 59* 64*  CREATININE 2.47* 2.74* 3.63* 5.88* 5.96*  CALCIUM 8.3* 8.3* 8.8* 9.3 8.6*  PHOS  --   --  6.2*  --   --     Liver Function Tests:  Recent Labs Lab 04/03/17 2148 04/05/17 1134  AST 23  --   ALT 14*  --   ALKPHOS 88  --   BILITOT 1.1  --   PROT 7.2  --   ALBUMIN 2.6* 2.4*   No results for input(s): LIPASE, AMYLASE in the last 168 hours.  Recent Labs Lab 04/04/17 0008  AMMONIA 34    CBC:  Recent Labs Lab 04/03/17 2148 04/04/17 0530 04/05/17 1134 04/08/17 2111 04/09/17 0604  WBC 5.6 5.8 5.2 7.9 8.4  NEUTROABS 3.8  --   --   --   --   HGB 8.8* 8.8* 8.5* 10.4* 9.0*  HCT 28.0* 28.2* 27.9* 33.6* 29.0*  MCV 85.2 84.4 84.9 85.7 83.3  PLT 240 214 217 234 208    Cardiac Enzymes:  Recent Labs Lab 04/08/17 2111 04/09/17 0604 04/09/17 1105  TROPONINI 0.10* 0.10* 0.09*    BNP: Invalid input(s): POCBNP  CBG:  Recent Labs Lab 04/04/17 0838 04/04/17 1147 04/05/17 0746 04/05/17 1633  GLUCAP 163* 148* 139* 120*    Microbiology: Results for orders  placed or performed during the hospital encounter of 04/08/17  Blood culture (routine x 2)     Status: None (Preliminary result)   Collection Time: 04/08/17  9:11 PM  Result Value Ref Range Status   Specimen Description BLOOD RIGHT HAND  Final   Special Requests   Final    BOTTLES DRAWN AEROBIC AND ANAEROBIC Blood Culture results may not be optimal due to an inadequate volume of blood received in culture bottles   Culture NO GROWTH < 12 HOURS  Final   Report Status PENDING  Incomplete  Blood culture (routine x 2)     Status: None (Preliminary result)   Collection Time: 04/09/17 12:09 AM  Result Value Ref Range Status   Specimen Description BLOOD RIGHT FOREARM  Final   Special Requests   Final    BOTTLES DRAWN AEROBIC AND ANAEROBIC Blood Culture adequate volume   Culture NO GROWTH < 12 HOURS  Final   Report  Status PENDING  Incomplete  MRSA PCR Screening     Status: Abnormal   Collection Time: 04/09/17 10:50 AM  Result Value Ref Range Status   MRSA by PCR POSITIVE (A) NEGATIVE Final    Comment:        The GeneXpert MRSA Assay (FDA approved for NASAL specimens only), is one component of a comprehensive MRSA colonization surveillance program. It is not intended to diagnose MRSA infection nor to guide or monitor treatment for MRSA infections. RESULT CALLED TO, READ BACK BY AND VERIFIED WITH: CRYSTAL GROGAN ON 04/09/17 AT 1242 JAG     Coagulation Studies: No results for input(s): LABPROT, INR in the last 72 hours.  Urinalysis: No results for input(s): COLORURINE, LABSPEC, PHURINE, GLUCOSEU, HGBUR, BILIRUBINUR, KETONESUR, PROTEINUR, UROBILINOGEN, NITRITE, LEUKOCYTESUR in the last 72 hours.  Invalid input(s): APPERANCEUR    Imaging: Dg Shoulder Right  Result Date: 04/09/2017 CLINICAL DATA:  Initial evaluation for acute shoulder pain, recent fall. EXAM: RIGHT SHOULDER - 2+ VIEW COMPARISON:  None. FINDINGS: There is no evidence of fracture or dislocation. There is no evidence of arthropathy or other focal bone abnormality. Soft tissues are unremarkable. IMPRESSION: No acute osseous abnormality about the right shoulder. Electronically Signed   By: Rise Mu M.D.   On: 04/09/2017 00:23   Ct Head Wo Contrast  Result Date: 04/09/2017 CLINICAL DATA:  Fatigue and weakness, fell, headache EXAM: CT HEAD WITHOUT CONTRAST CT CERVICAL SPINE WITHOUT CONTRAST TECHNIQUE: Multidetector CT imaging of the head and cervical spine was performed following the standard protocol without intravenous contrast. Multiplanar CT image reconstructions of the cervical spine were also generated. COMPARISON:  10/07/2014, 06/17/2013, 02/02/2007 FINDINGS: CT HEAD FINDINGS Brain: No acute territorial infarction, hemorrhage, or intracranial mass is seen. The ventricles are nonenlarged. Vascular: No hyperdense vessels.   Carotid artery calcifications. Skull: No fracture or suspicious bone lesion. Sinuses/Orbits: Mild mucosal thickening in the sinuses. Interval finding of diffuse increased density in the right globe Other: None CT CERVICAL SPINE FINDINGS Alignment: No subluxation is seen. Facet alignment is within normal limits. Skull base and vertebrae: No acute fracture. No primary bone lesion or focal pathologic process. Soft tissues and spinal canal: No prevertebral fluid or swelling. No visible canal hematoma. Disc levels: Mild to moderate degenerative disc changes at C4-C5 and C6-C7 with posterior disc osteophyte at C4-C5. Foraminal narrowing bilaterally at C6-C7. Upper chest: Partially visualized moderate to large pleural effusions. Heterogenous multinodular thyroid gland with possible dominant nodule from the inferior pole on the left. Other: None IMPRESSION: 1. No CT  evidence for acute intracranial abnormality. 2. No acute fracture or malalignment of the cervical spine 3. Interval finding of diffuse increased density in the right lobe, correlate for any interval history of right eye surgery 4. Moderate to large pleural effusions at the apices. 5. Heterogenous multinodular appearing thyroid which may be correlated with nonemergent ultrasound Electronically Signed   By: Jasmine Pang M.D.   On: 04/09/2017 00:52   Ct Cervical Spine Wo Contrast  Result Date: 04/09/2017 CLINICAL DATA:  Fatigue and weakness, fell, headache EXAM: CT HEAD WITHOUT CONTRAST CT CERVICAL SPINE WITHOUT CONTRAST TECHNIQUE: Multidetector CT imaging of the head and cervical spine was performed following the standard protocol without intravenous contrast. Multiplanar CT image reconstructions of the cervical spine were also generated. COMPARISON:  10/07/2014, 06/17/2013, 02/02/2007 FINDINGS: CT HEAD FINDINGS Brain: No acute territorial infarction, hemorrhage, or intracranial mass is seen. The ventricles are nonenlarged. Vascular: No hyperdense vessels.   Carotid artery calcifications. Skull: No fracture or suspicious bone lesion. Sinuses/Orbits: Mild mucosal thickening in the sinuses. Interval finding of diffuse increased density in the right globe Other: None CT CERVICAL SPINE FINDINGS Alignment: No subluxation is seen. Facet alignment is within normal limits. Skull base and vertebrae: No acute fracture. No primary bone lesion or focal pathologic process. Soft tissues and spinal canal: No prevertebral fluid or swelling. No visible canal hematoma. Disc levels: Mild to moderate degenerative disc changes at C4-C5 and C6-C7 with posterior disc osteophyte at C4-C5. Foraminal narrowing bilaterally at C6-C7. Upper chest: Partially visualized moderate to large pleural effusions. Heterogenous multinodular thyroid gland with possible dominant nodule from the inferior pole on the left. Other: None IMPRESSION: 1. No CT evidence for acute intracranial abnormality. 2. No acute fracture or malalignment of the cervical spine 3. Interval finding of diffuse increased density in the right lobe, correlate for any interval history of right eye surgery 4. Moderate to large pleural effusions at the apices. 5. Heterogenous multinodular appearing thyroid which may be correlated with nonemergent ultrasound Electronically Signed   By: Jasmine Pang M.D.   On: 04/09/2017 00:52   Dg Chest Port 1 View  Result Date: 04/08/2017 CLINICAL DATA:  Shortness of breath EXAM: PORTABLE CHEST 1 VIEW COMPARISON:  04/05/2017 FINDINGS: Left-sided central venous catheter tip overlies the SVC. Moderate right pleural effusion, increased compared to prior. Small left-sided pleural effusion grossly unchanged. Bibasilar consolidations slightly worsened on the right. Cardiomegaly with central vascular congestion. No pneumothorax. IMPRESSION: 1. Small left pleural effusion and adjacent left basilar atelectasis or pneumonia 2. Moderate right pleural effusion, increased compared to prior. Worsening consolidation at  the right lung base 3. Cardiomegaly with central vascular congestion Electronically Signed   By: Jasmine Pang M.D.   On: 04/08/2017 23:15     Medications:   . amiodarone  200 mg Oral Daily  . aspirin EC  325 mg Oral Daily  . [START ON 04/10/2017] Chlorhexidine Gluconate Cloth  6 each Topical Q0600  . feeding supplement (NEPRO CARB STEADY)  237 mL Oral BID WC  . fluticasone furoate-vilanterol  1 puff Inhalation Daily  . furosemide  40 mg Intravenous Q12H  . heparin  5,000 Units Subcutaneous Q8H  . methimazole  10 mg Oral Daily  . midodrine  10 mg Oral TID WC  . multivitamin  1 tablet Oral QHS  . mupirocin ointment  1 application Nasal BID  . pantoprazole  40 mg Oral Daily  . [START ON 04/10/2017] protein supplement shake  11 oz Oral Q24H  . sodium chloride  flush  3 mL Intravenous Q12H  . timolol  1 drop Right Eye BID      Assessment/ Plan:  46 y.o.white male with End stage renal disease on hemodialysis, hypertension, diabetes mellitus type 2, peripheral neuropathy, atrial fibrillation, multiple episodes of spontaneous bacterial peritonitis, depression, hyperthyroidism, cardiac arrest, systolic congestive heart failure   [name]   1. ESRD on HD TTS:   Patient stated that he attended his dialysis on Saturday although outpatient records show that he missed his treatment.   His current oxygen requirements are by nasal cannula.  He has slight hyperkalemia with potassium of 5.2 We will continue his hemodialysis starting tomorrow  2. Anemia chronic kidney disease: hemoglobin 9.0 - EPO with HD treatment  3. Secondary hyperparathyroidism:  Recent phosphorus 6.2  Not currently on a binder. will monitor  4. Hypertension: with atrial fibrillation. BP support with midodrine and iv albumin  5.  Volume overload, anasarca and ascites Fluid removal with dialysis as tolerated.  Support with midodrine and IV albumin    LOS: 0 Lashon Hillier 7/9/20186:03 PM

## 2017-04-09 NOTE — Progress Notes (Signed)
Patient is admitted to room 232 with the diagnosis of HCAP. Alert and oriented x 4 but drowsy.  Tele box called to CCMD and was verified by Cyndia BentKat M. RN. Skin done with Georgiann HahnKat as well, [facility] epic [facility]  [facility]. No acute distress noted. Will continue to monitor.

## 2017-04-09 NOTE — ED Notes (Signed)
Pt reports feeling s though he has to sit up to catch breath. Oxygen saturation had dropped when pt reported feeling this way but pt also had taken nasal cannula off as well. [place] placed back on pts nose and pt sitting straight up.

## 2017-04-09 NOTE — ED Notes (Signed)
Date and time results received: 04/09/17         Test: Lactic Acid Critical Value: 2.8  Name of Provider Notified:[facility]  Orders Received? Or Actions Taken?: No new orders received  Actions Taken: No new orders

## 2017-04-09 NOTE — Progress Notes (Addendum)
Initial Nutrition Assessment  DOCUMENTATION CODES:   Severe malnutrition in context of chronic illness  INTERVENTION:  Nepro Shake po BID, each supplement provides 425 kcal and 19 grams protein Premier protein PO Q24H, each supplement provides 160 calories and 30 grams protein Recommend Rena-Vit  NUTRITION DIAGNOSIS:   Malnutrition related to chronic illness as evidenced by severe depletion of body fat, severe depletion of muscle mass.  GOAL:   Patient will meet greater than or equal to 90% of their needs  MONITOR:   PO intake, I & O's, Labs, Supplement acceptance, Weight trends, Skin  REASON FOR ASSESSMENT:   Malnutrition Screening Tool    ASSESSMENT:   46 yo male with PMH of DM, HTN, ESRD on HD, GERD, Cellulitis, Osteomyelitis, Gastritis, MI, CHF EF 20% presents with fall, head injury and weakness He skipped dialysis on Saturday, was at the beach in [place]. Normal schedule TTS Was very drowsy today, did not want to talk. States he continues to consume small, frequent meals, usually 3-4 daily eating chicken, fish, salad, steak and consuming Premier protein x2, Nepro x2, renal MVI. He does not complain of abd/pain or nausea/vomiting this admission. Denies any issues chewing/swallowing/choking. Weight is up 18# from previous admission - usual weight likely masked by fluid accumulation. Apparent usual weight was 212# at some point, but patient has not been at this weight Nutrition-Focused physical exam completed. Findings are severe fat depletion, severe muscle depletion, and moderate-severe edema.  Labs and medications reviewed: Na 131, BUN/Creatinine 64/5.96 Protonix, Lasix  Diet Order:  Diet renal with fluid restriction Fluid restriction: 1200 mL Fluid; Room service appropriate? Yes; Fluid consistency: Thin  Skin:  Wound (see comment) (Stg I to bony prominence/sacrum)  Last BM:  04/08/2017  Height:   Ht Readings from Last 1 Encounters:  04/08/17 6\' 2"  (1.88 m)     Weight:   Wt Readings from Last 1 Encounters:  04/09/17 195 lb 3.2 oz (88.5 kg)    Ideal Body Weight:  86.36 kg  BMI:  Body mass index is 25.06 kg/m.  Estimated Nutritional Needs:   Kcal:  2300-2700 calories (25-30 cal/kg)  Protein:  115-140 grams  Fluid:  UOP + 1L  EDUCATION NEEDS:   No education needs identified at this time  Juan AnoWilliam M. Cathleen Yagi, MS, RD LDN Inpatient Clinical Dietitian Pager [phone]

## 2017-04-09 NOTE — Progress Notes (Signed)
The patient is drowsy. He complains of generalized body pain. Vital signs are stable but blood pressure is still low side.  Physical examinations: Diminished lung sounds on the right side. No leg edema.  46 year old male patient with history of end-stage renal disease on dialysis, congestive heart failure, myocardial infarction, hypertension presented to the emergency room with fall and shortness of breath.  A/P: 1.  acute respiratory failure with hypoxia due to Bilateral pleural effusion and  Healthcare associated pneumonia Try to wean off oxygen, continue cefepime and vancomycin, and follow-up cultures. 2. Abnormal troponin could be secondary to demand ischemia 3. End-stage renal disease on dialysis. Hemodialysis today per Dr. Thedore MinsSingh. 4. Acidosis. Improved with above treatment. 5. HTN. Blood pressure is still low side. Hold lasix if BP is low.  I discussed with Dr. Thedore MinsSingh.

## 2017-04-09 NOTE — ED Notes (Signed)
Patient transported to CT 

## 2017-04-09 NOTE — Progress Notes (Signed)
Dr. Imogene Burnhen on the floor made aware patient c/o 10/10 pain generalized remains drowsy only had prn tylenol. Per md no new orders for other prn pain medications

## 2017-04-09 NOTE — Progress Notes (Signed)
Pharmacy Antibiotic Note  Juan BlossomWaltzie L Aderhold Jr. is a 46 y.o. male admitted on 04/08/2017 with pneumonia.  Pharmacy has been consulted for vanc/zosyn dosing.  Plan: Patient received vanc 1g and zosyn 3.375g IV x 1 in ED  Patient has dialysis on TThSa -- will schedule vanc 1g post dialysis. Will draw VT 7/10 @ 0500 prior to 3rd dialysis session. Will start zosyn 3.375g IV q12h   Height: 6\' 2"  (188 cm) Weight: 182 lb (82.6 kg) IBW/kg (Calculated) : 82.2  Temp (24hrs), Avg:99.5 F (37.5 C), Min:99.5 F (37.5 C), Max:99.5 F (37.5 C)   Recent Labs Lab 04/03/17 2148 04/04/17 0530 04/05/17 1134 04/08/17 2111 04/09/17 0009  WBC 5.6 5.8 5.2 7.9  --   CREATININE 2.47* 2.74* 3.63* 5.88*  --   LATICACIDVEN 1.8  --   --  2.8* 2.8*    Estimated Creatinine Clearance: 17.9 mL/min (A) (by C-G formula based on SCr of 5.88 mg/dL (H)).    Allergies  Allergen Reactions  . Sulfur Other (See Comments)    Pt states that this medication causes renal failure.    . Hydrocodone Itching  . Oxycodone Itching  . Tramadol Itching    Thank you for allowing pharmacy to be a part of this patient's care.  Thomasene Rippleavid Marquelle Musgrave, PharmD, BCPS Clinical Pharmacist 04/09/2017

## 2017-04-09 NOTE — Care Management (Signed)
Patient chronic HD patient [name] Notified [name] with [facility] of admission.  [facility].  Reaching out to agency to determine if patient was open to care.  he had indicated he would accept if he did not have copays for visits.

## 2017-04-10 LAB — RENAL FUNCTION PANEL
ALBUMIN: 2.3 g/dL — AB (ref 3.5–5.0)
Anion gap: 12 (ref 5–15)
BUN: 46 mg/dL — ABNORMAL HIGH (ref 6–20)
CALCIUM: 8.1 mg/dL — AB (ref 8.9–10.3)
CO2: 25 mmol/L (ref 22–32)
CREATININE: 4.56 mg/dL — AB (ref 0.61–1.24)
Chloride: 96 mmol/L — ABNORMAL LOW (ref 101–111)
GFR calc non Af Amer: 14 mL/min — ABNORMAL LOW (ref >60)
GFR, EST AFRICAN AMERICAN: 16 mL/min — AB (ref >60)
GLUCOSE: 119 mg/dL — AB (ref 65–99)
PHOSPHORUS: 5.5 mg/dL — AB (ref 2.5–4.6)
Potassium: 3.9 mmol/L (ref 3.5–5.1)
SODIUM: 133 mmol/L — AB (ref 135–145)

## 2017-04-10 LAB — CBC
HCT: 30.9 % — ABNORMAL LOW (ref 40.0–52.0)
Hemoglobin: 9.5 g/dL — ABNORMAL LOW (ref 13.0–18.0)
MCH: 26 pg (ref 26.0–34.0)
MCHC: 30.6 g/dL — AB (ref 32.0–36.0)
MCV: 85 fL (ref 80.0–100.0)
PLATELETS: 231 10*3/uL (ref 150–440)
RBC: 3.64 MIL/uL — ABNORMAL LOW (ref 4.40–5.90)
RDW: 20.1 % — AB (ref 11.5–14.5)
WBC: 7.8 10*3/uL (ref 3.8–10.6)

## 2017-04-10 LAB — PHOSPHORUS: Phosphorus: 8.9 mg/dL — ABNORMAL HIGH (ref 2.5–4.6)

## 2017-04-10 LAB — GLUCOSE, CAPILLARY: GLUCOSE-CAPILLARY: 101 mg/dL — AB (ref 65–99)

## 2017-04-10 LAB — VANCOMYCIN, RANDOM: Vancomycin Rm: 20

## 2017-04-10 NOTE — Progress Notes (Signed)
[facility]  ROUNDING NOTE   Subjective:    Patient is very lethargic today therefore limited information is available.  According to chart, patient presents for extreme fatigue and weakness for the past 2 days prior to admission.  According to patient's daughter, he had not been acting like himself.  [place] and [place] to the local hospital where a head CT was negative.  He had bruising to the back of his head.  Patient seen during dialysis Tolerating well   HEMODIALYSIS FLOWSHEET:  Blood Flow Rate (mL/min): 350 mL/min Arterial Pressure (mmHg): -140 mmHg Venous Pressure (mmHg): 150 mmHg Transmembrane Pressure (mmHg): 50 mmHg Ultrafiltration Rate (mL/min): 1010 mL/min Dialysate Flow Rate (mL/min): 600 ml/min Conductivity: Machine : 14.2 Conductivity: Machine : 14.2 Dialysis Fluid Bolus: Normal Saline Bolus Amount (mL): 250 mL  Very sleepy  Did not wake up to answer any questions     Objective:  Vital signs in last 24 hours:  Temp:  [97.6 F (36.4 C)-98.2 F (36.8 C)] 98 F (36.7 C) (07/10 1316) Pulse Rate:  [81-96] 81 (07/10 1501) Resp:  [12-19] 14 (07/10 1319) BP: (91-130)/(60-112) 108/71 (07/10 1501) SpO2:  [90 %-100 %] 100 % (07/10 1501) Weight:  [88.5 kg (195 lb 1.7 oz)] 88.5 kg (195 lb 1.7 oz) (07/10 0937)  Weight change:  Filed Weights   04/08/17 2031 04/09/17 0557 04/10/17 0937  Weight: 82.6 kg (182 lb) 88.5 kg (195 lb 3.2 oz) 88.5 kg (195 lb 1.7 oz)    Intake/Output: I/O last 3 completed shifts: In: 1003 [P.O.:200; I.V.:3; IV Piggyback:800] Out: 0    Intake/Output this shift:  Total I/O In: -  Out: 2400 [Other:2400]  Physical Exam: General: No acute distress  Head: Normocephalic, atraumatic. Moist oral mucosal membranes  Eyes: Anicteric  Neck: Supple,    Lungs:  Clear, shallow breathing, nasal cannula oxygen  Heart: S1S2 no rubs, irregular  Abdomen:  Soft, nontender, distended, ascites   Extremities: ++ edema.  Neurologic: Lethargic, did not answer any questions  Skin: No lesions  Access: L IJ permcath    Basic Metabolic Panel:  Recent Labs Lab 04/04/17 0530 04/05/17 1134 04/08/17 2111 04/09/17 0604 04/10/17 0500  NA 133* 131* 132* 131* 133*  K 3.9 4.5 5.6* 5.2* 3.9  CL 98* 94* 94* 95* 96*  CO2 30 26 23 24 25   GLUCOSE 196* 131* 172* 183* 119*  BUN 41* 48* 59* 64* 46*  CREATININE 2.74* 3.63* 5.88* 5.96* 4.56*  CALCIUM 8.3* 8.8* 9.3 8.6* 8.1*  PHOS  --  6.2*  --   --  8.9*  5.5*    Liver Function Tests:  Recent Labs Lab 04/03/17 2148 04/05/17 1134 04/10/17 0500  AST 23  --   --   ALT 14*  --   --   ALKPHOS 88  --   --   BILITOT 1.1  --   --   PROT 7.2  --   --   ALBUMIN 2.6* 2.4* 2.3*   No results for input(s): LIPASE, AMYLASE in the last 168 hours.  Recent Labs Lab 04/04/17 0008  AMMONIA 34    CBC:  Recent Labs Lab 04/03/17 2148 04/04/17 0530 04/05/17 1134 04/08/17 2111 04/09/17 0604 04/10/17 0500  WBC 5.6 5.8 5.2 7.9 8.4 7.8  NEUTROABS 3.8  --   --   --   --   --   HGB 8.8* 8.8* 8.5* 10.4* 9.0* 9.5*  HCT 28.0* 28.2* 27.9*  33.6* 29.0* 30.9*  MCV 85.2 84.4 84.9 85.7 83.3 85.0  PLT 240 214 217 234 208 231    Cardiac Enzymes:  Recent Labs Lab 04/08/17 2111 04/09/17 0604 04/09/17 1105 04/09/17 1753  TROPONINI 0.10* 0.10* 0.09* 0.08*    BNP: Invalid input(s): POCBNP  CBG:  Recent Labs Lab 04/04/17 1147 04/05/17 0746 04/05/17 1633 04/09/17 2122 04/10/17 1340  GLUCAP 148* 139* 120* 161* 101*    Microbiology: Results for orders placed or performed during the hospital encounter of 04/08/17  Blood culture (routine x 2)     Status: None (Preliminary result)   Collection Time: 04/08/17  9:11 PM  Result Value Ref Range Status   Specimen Description BLOOD RIGHT HAND  Final   Special Requests   Final    BOTTLES DRAWN AEROBIC AND ANAEROBIC Blood Culture results may not be optimal due to an inadequate volume of blood  received in culture bottles   Culture NO GROWTH 2 DAYS  Final   Report Status PENDING  Incomplete  Blood culture (routine x 2)     Status: None (Preliminary result)   Collection Time: 04/09/17 12:09 AM  Result Value Ref Range Status   Specimen Description BLOOD RIGHT FOREARM  Final   Special Requests   Final    BOTTLES DRAWN AEROBIC AND ANAEROBIC Blood Culture adequate volume   Culture NO GROWTH 1 DAY  Final   Report Status PENDING  Incomplete  MRSA PCR Screening     Status: Abnormal   Collection Time: 04/09/17 10:50 AM  Result Value Ref Range Status   MRSA by PCR POSITIVE (A) NEGATIVE Final    Comment:        The GeneXpert MRSA Assay (FDA approved for NASAL specimens only), is one component of a comprehensive MRSA colonization surveillance program. It is not intended to diagnose MRSA infection nor to guide or monitor treatment for MRSA infections. RESULT CALLED TO, READ BACK BY AND VERIFIED WITH: CRYSTAL GROGAN ON 04/09/17 AT 1242 JAG     Coagulation Studies: No results for input(s): LABPROT, INR in the last 72 hours.  Urinalysis: No results for input(s): COLORURINE, LABSPEC, PHURINE, GLUCOSEU, HGBUR, BILIRUBINUR, KETONESUR, PROTEINUR, UROBILINOGEN, NITRITE, LEUKOCYTESUR in the last 72 hours.  Invalid input(s): APPERANCEUR    Imaging: Dg Shoulder Right  Result Date: 04/09/2017 CLINICAL DATA:  Initial evaluation for acute shoulder pain, recent fall. EXAM: RIGHT SHOULDER - 2+ VIEW COMPARISON:  None. FINDINGS: There is no evidence of fracture or dislocation. There is no evidence of arthropathy or other focal bone abnormality. Soft tissues are unremarkable. IMPRESSION: No acute osseous abnormality about the right shoulder. Electronically Signed   By: Rise MuBenjamin  McClintock M.D.   On: 04/09/2017 00:23   Ct Head Wo Contrast  Result Date: 04/09/2017 CLINICAL DATA:  Fatigue and weakness, fell, headache EXAM: CT HEAD WITHOUT CONTRAST CT CERVICAL SPINE WITHOUT CONTRAST TECHNIQUE:  Multidetector CT imaging of the head and cervical spine was performed following the standard protocol without intravenous contrast. Multiplanar CT image reconstructions of the cervical spine were also generated. COMPARISON:  10/07/2014, 06/17/2013, 02/02/2007 FINDINGS: CT HEAD FINDINGS Brain: No acute territorial infarction, hemorrhage, or intracranial mass is seen. The ventricles are nonenlarged. Vascular: No hyperdense vessels.  Carotid artery calcifications. Skull: No fracture or suspicious bone lesion. Sinuses/Orbits: Mild mucosal thickening in the sinuses. Interval finding of diffuse increased density in the right globe Other: None CT CERVICAL SPINE FINDINGS Alignment: No subluxation is seen. Facet alignment is within normal limits. Skull base and vertebrae: No  acute fracture. No primary bone lesion or focal pathologic process. Soft tissues and spinal canal: No prevertebral fluid or swelling. No visible canal hematoma. Disc levels: Mild to moderate degenerative disc changes at C4-C5 and C6-C7 with posterior disc osteophyte at C4-C5. Foraminal narrowing bilaterally at C6-C7. Upper chest: Partially visualized moderate to large pleural effusions. Heterogenous multinodular thyroid gland with possible dominant nodule from the inferior pole on the left. Other: None IMPRESSION: 1. No CT evidence for acute intracranial abnormality. 2. No acute fracture or malalignment of the cervical spine 3. Interval finding of diffuse increased density in the right lobe, correlate for any interval history of right eye surgery 4. Moderate to large pleural effusions at the apices. 5. Heterogenous multinodular appearing thyroid which may be correlated with nonemergent ultrasound Electronically Signed   By: Jasmine Pang M.D.   On: 04/09/2017 00:52   Ct Cervical Spine Wo Contrast  Result Date: 04/09/2017 CLINICAL DATA:  Fatigue and weakness, fell, headache EXAM: CT HEAD WITHOUT CONTRAST CT CERVICAL SPINE WITHOUT CONTRAST TECHNIQUE:  Multidetector CT imaging of the head and cervical spine was performed following the standard protocol without intravenous contrast. Multiplanar CT image reconstructions of the cervical spine were also generated. COMPARISON:  10/07/2014, 06/17/2013, 02/02/2007 FINDINGS: CT HEAD FINDINGS Brain: No acute territorial infarction, hemorrhage, or intracranial mass is seen. The ventricles are nonenlarged. Vascular: No hyperdense vessels.  Carotid artery calcifications. Skull: No fracture or suspicious bone lesion. Sinuses/Orbits: Mild mucosal thickening in the sinuses. Interval finding of diffuse increased density in the right globe Other: None CT CERVICAL SPINE FINDINGS Alignment: No subluxation is seen. Facet alignment is within normal limits. Skull base and vertebrae: No acute fracture. No primary bone lesion or focal pathologic process. Soft tissues and spinal canal: No prevertebral fluid or swelling. No visible canal hematoma. Disc levels: Mild to moderate degenerative disc changes at C4-C5 and C6-C7 with posterior disc osteophyte at C4-C5. Foraminal narrowing bilaterally at C6-C7. Upper chest: Partially visualized moderate to large pleural effusions. Heterogenous multinodular thyroid gland with possible dominant nodule from the inferior pole on the left. Other: None IMPRESSION: 1. No CT evidence for acute intracranial abnormality. 2. No acute fracture or malalignment of the cervical spine 3. Interval finding of diffuse increased density in the right lobe, correlate for any interval history of right eye surgery 4. Moderate to large pleural effusions at the apices. 5. Heterogenous multinodular appearing thyroid which may be correlated with nonemergent ultrasound Electronically Signed   By: Jasmine Pang M.D.   On: 04/09/2017 00:52   Dg Chest Port 1 View  Result Date: 04/08/2017 CLINICAL DATA:  Shortness of breath EXAM: PORTABLE CHEST 1 VIEW COMPARISON:  04/05/2017 FINDINGS: Left-sided central venous catheter tip  overlies the SVC. Moderate right pleural effusion, increased compared to prior. Small left-sided pleural effusion grossly unchanged. Bibasilar consolidations slightly worsened on the right. Cardiomegaly with central vascular congestion. No pneumothorax. IMPRESSION: 1. Small left pleural effusion and adjacent left basilar atelectasis or pneumonia 2. Moderate right pleural effusion, increased compared to prior. Worsening consolidation at the right lung base 3. Cardiomegaly with central vascular congestion Electronically Signed   By: Jasmine Pang M.D.   On: 04/08/2017 23:15     Medications:   . amiodarone  200 mg Oral Daily  . aspirin EC  325 mg Oral Daily  . Chlorhexidine Gluconate Cloth  6 each Topical Q0600  . feeding supplement (NEPRO CARB STEADY)  237 mL Oral BID WC  . fluticasone furoate-vilanterol  1 puff Inhalation Daily  .  furosemide  40 mg Intravenous Q12H  . heparin  5,000 Units Subcutaneous Q8H  . methimazole  10 mg Oral Daily  . midodrine  10 mg Oral TID WC  . multivitamin  1 tablet Oral QHS  . mupirocin ointment  1 application Nasal BID  . pantoprazole  40 mg Oral Daily  . protein supplement shake  11 oz Oral Q24H  . sodium chloride flush  3 mL Intravenous Q12H  . timolol  1 drop Right Eye BID      Assessment/ Plan:  46 y.o.white male with End stage renal disease on hemodialysis, hypertension, diabetes mellitus type 2, peripheral neuropathy, atrial fibrillation, multiple episodes of spontaneous bacterial peritonitis, depression, hyperthyroidism, multinodular goiter, h/o cardiac arrest, systolic congestive heart failure   [name]   1. ESRD on HD TTS:   Patient seen during dialysis Tolerating well  UF goal 2-3 kg as tolerated  2. Anemia chronic kidney disease: hemoglobin 9.5 - EPO with HD treatment  3. Secondary hyperparathyroidism:  Recent phosphorus 5.5 Not currently on a binder. will monitor  4. Hypertension: with atrial fibrillation. BP support  with midodrine and iv albumin  5.  Volume overload, anasarca and ascites, moderate to  large right pleural effusion Fluid removal with dialysis as tolerated.  Support with midodrine and IV albumin    LOS: 1 Melisha Eggleton 7/10/20184:01 PM

## 2017-04-10 NOTE — Plan of Care (Signed)
Problem: Pain Managment: Goal: General experience of comfort will improve Outcome: Progressing No complaints of pain this shift, will continue to monitor.  Problem: Tissue Perfusion: Goal: Risk factors for ineffective tissue perfusion will decrease Outcome: Progressing SCD's on & heparin subcutaneously for VTE

## 2017-04-10 NOTE — Progress Notes (Signed)
Pre hd info 

## 2017-04-10 NOTE — Progress Notes (Signed)
Post hd vitals 

## 2017-04-10 NOTE — Progress Notes (Signed)
  End of hd 

## 2017-04-10 NOTE — Progress Notes (Signed)
Pre hd assessment  

## 2017-04-10 NOTE — Progress Notes (Signed)
[facility] [place] at [facility]   PATIENT NAME: Juan Roberson    MR#:  [id number]  DATE OF BIRTH:  09/27/1971  SUBJECTIVE:  CHIEF COMPLAINT:   Chief Complaint  Patient presents with  . Fall  . Head Injury  . Weakness   The patient is sleepy during hemodialysis. REVIEW OF SYSTEMS:  Review of Systems  Unable to perform ROS: Medical condition    DRUG ALLERGIES:   Allergies  Allergen Reactions  . Sulfur Other (See Comments)    Pt states that this medication causes renal failure.    . Hydrocodone Itching  . Oxycodone Itching  . Tramadol Itching   VITALS:  Blood pressure 108/71, pulse 81, temperature 98 F (36.7 C), temperature source Oral, resp. rate 14, height 6\' 2"  (1.88 m), weight 195 lb 1.7 oz (88.5 kg), SpO2 100 %. PHYSICAL EXAMINATION:  Physical Exam  Constitutional: He is well-developed, well-nourished, and in no distress.  HENT:  Head: Normocephalic.  Eyes: Conjunctivae and EOM are normal. No scleral icterus.  Neck: Normal range of motion. Neck supple. No JVD present. No tracheal deviation present.  Cardiovascular: Normal rate, regular rhythm and normal heart sounds.  Exam reveals no gallop.   No murmur heard. Pulmonary/Chest: Effort normal and breath sounds normal. No respiratory distress. He has no wheezes. He has no rales.  Diminished lung sounds on the right side  Abdominal: Soft. Bowel sounds are normal. He exhibits no distension. There is no tenderness.  Musculoskeletal: He exhibits no edema or tenderness.  Neurological:  Awake but sleepy, unable to exam.  Skin: No rash noted. No erythema.   LABORATORY PANEL:  Male CBC  Recent Labs Lab 04/10/17 0500  WBC 7.8  HGB 9.5*  HCT 30.9*  PLT 231   ------------------------------------------------------------------------------------------------------------------ Chemistries   Recent Labs Lab 04/03/17 2148  04/10/17 0500  NA 137  < > 133*  K 3.7  < > 3.9  CL 98*  < > 96*    CO2 28  < > 25  GLUCOSE 153*  < > 119*  BUN 35*  < > 46*  CREATININE 2.47*  < > 4.56*  CALCIUM 8.3*  < > 8.1*  AST 23  --   --   ALT 14*  --   --   ALKPHOS 88  --   --   BILITOT 1.1  --   --   < > = values in this interval not displayed. RADIOLOGY:  No results found. ASSESSMENT AND PLAN:   46 year old male patient with history of end-stage renal disease on dialysis, congestive heart failure, myocardial infarction, hypertension presented to the emergency room with fall and shortness of breath.  1. acute respiratory failure with hypoxia due to Bilateral pleural effusion and Healthcare associated pneumonia Weaned off oxygen, continue cefepime and vancomycin, and follow-up cultures.  Moderate right pleural effusion. Repeat chest x-ray tomorrow, thoracentesis when necessary.  2.Abnormal troponin could be secondary to demand ischemia 3.End-stage renal disease on dialysis. Hemodialysis today per Dr. Thedore MinsSingh. 4. Lactic acidosis. Improved with above treatment. 5. HTN. Blood pressure is still low side. Hold lasix if BP is low.  I discussed with Dr. Thedore MinsSingh.  All the records are reviewed and case discussed with Care Management/Social Worker. Management plans discussed with the patient, family and they are in agreement.  CODE STATUS: Full Code  TOTAL TIME TAKING CARE OF THIS PATIENT: 33 minutes.   More than 50% of the time was spent in counseling/coordination of care: YES  POSSIBLE D/C IN 2 DAYS, DEPENDING ON CLINICAL CONDITION.   Shaune Pollack M.D on 04/10/2017 at 3:07 PM  Between 7am to 6pm - Pager - [phone]  After 6pm go to [email or web address] - Social research officer, government  [facility] [place] [facility]  Office  [phone]  CC: Primary care physician; Corky Downs, MD  Note: This dictation was prepared with Dragon dictation along with smaller phrase technology. Any transcriptional errors that result from this process are unintentional.

## 2017-04-10 NOTE — Progress Notes (Signed)
Post hd assessment 

## 2017-04-10 NOTE — Consult Note (Signed)
Pharmacy Antibiotic Note  Juan BlossomWaltzie L Lorenzo Jr. is a [age] y.o. male admitted on 04/08/2017 with pneumonia.  Pharmacy has been consulted for vancomycin/cefepime dosing.  Plan: Vanc level prior to HD resulted at 20 This patient's current antibiotics will be continued without adjustments.  Height: 6\' 2"  (188 cm) Weight: 195 lb 1.7 oz (88.5 kg) IBW/kg (Calculated) : 82.2  Temp (24hrs), Avg:98 F (36.7 C), Min:97.6 F (36.4 C), Max:98.2 F (36.8 C)   Recent Labs Lab 04/03/17 2148 04/04/17 0530 04/05/17 1134 04/08/17 2111 04/09/17 0009 04/09/17 0415 04/09/17 0604 04/10/17 0500  WBC 5.6 5.8 5.2 7.9  --   --  8.4 7.8  CREATININE 2.47* 2.74* 3.63* 5.88*  --   --  5.96* 4.56*  LATICACIDVEN 1.8  --   --  2.8* 2.8* 1.3  --   --   VANCORANDOM  --   --   --   --   --   --   --  20    Estimated Creatinine Clearance: 23 mL/min (A) (by C-G formula based on SCr of 4.56 mg/dL (H)).    Allergies  Allergen Reactions  . Sulfur Other (See Comments)    Pt states that this medication causes renal failure.    . Hydrocodone Itching  . Oxycodone Itching  . Tramadol Itching    Antimicrobials this admission: vanc 7/9 >> (pt also received doses on 7/3 and 7/4) cefepime 7/9 >>  Zosyn 7/9>>  Dose adjustments this admission:   Microbiology results: 7/9 BCx: NG   Thank you for allowing pharmacy to be a part of this patient's care.  Olene FlossMelissa D Sieara Bremer, Pharm.D, BCPS Clinical Pharmacist  04/10/2017 1:36 PM

## 2017-04-11 ENCOUNTER — Inpatient Hospital Stay: Payer: BLUE CROSS/BLUE SHIELD

## 2017-04-11 MED ORDER — NEPRO/CARBSTEADY PO LIQD
237.0000 mL | Freq: Two times a day (BID) | ORAL | Status: DC
Start: 2017-04-11 — End: 2017-04-12

## 2017-04-11 MED ORDER — MORPHINE SULFATE (PF) 2 MG/ML IV SOLN
2.0000 mg | INTRAVENOUS | Status: DC | PRN
  Administered 2017-04-11 – 2017-04-12 (×3): 2 mg via INTRAVENOUS
  Filled 2017-04-11 (×4): qty 1

## 2017-04-11 MED ORDER — ALBUMIN HUMAN 25 % IV SOLN
12.5000 g | Freq: Once | INTRAVENOUS | Status: AC
  Administered 2017-04-11: 12.5 g via INTRAVENOUS
  Filled 2017-04-11: qty 50

## 2017-04-11 NOTE — Progress Notes (Signed)
Pt. Daughter states she would like to speak with case worker Automotive engineerann. Nann made aware states she will call pt. Daughter [place]any. Daughter made aware

## 2017-04-11 NOTE — Progress Notes (Signed)
[facility] - [place] at [facility]   PATIENT NAME: Juan Roberson    MR#:  [id number]  DATE OF BIRTH:  [birth year]  SUBJECTIVE:  CHIEF COMPLAINT:   Chief Complaint  Patient presents with  . Fall  . Head Injury  . Weakness   Pt is alert and oriented, c/o pain in his back.  REVIEW OF SYSTEMS:  Review of Systems  Constitutional: Positive for malaise/fatigue. Negative for fever.  HENT: Negative for congestion, ear pain, hearing loss and sore throat.   Eyes: Negative for blurred vision and double vision.  Respiratory: Positive for shortness of breath. Negative for cough and sputum production.   Cardiovascular: Positive for chest pain. Negative for palpitations, orthopnea and leg swelling.  Gastrointestinal: Negative for abdominal pain, nausea and vomiting.  Genitourinary: Negative for dysuria and frequency.  Musculoskeletal: Negative for back pain and joint pain.  Skin: Negative for rash.  Neurological: Positive for weakness. Negative for dizziness, tremors, focal weakness and headaches.  Psychiatric/Behavioral: Negative for depression.    DRUG ALLERGIES:   Allergies  Allergen Reactions  . Sulfur Other (See Comments)    Pt states that this medication causes renal failure.    . Hydrocodone Itching  . Oxycodone Itching  . Tramadol Itching   VITALS:  Blood pressure 117/80, pulse 94, temperature 98 F (36.7 C), temperature source Oral, resp. rate 16, height 6\' 2"  (1.88 m), weight 88.5 kg (195 lb 1.7 oz), SpO2 100 %. PHYSICAL EXAMINATION:  Physical Exam  Constitutional: He is oriented to person, place, and time and well-developed, well-nourished, and in no distress.  HENT:  Head: Normocephalic.  Eyes: Conjunctivae and EOM are normal. No scleral icterus.  Neck: Normal range of motion. Neck supple. No JVD present. No tracheal deviation present.  Cardiovascular: Normal rate, regular rhythm and normal heart sounds.  Exam reveals no gallop.   No murmur  heard. Pulmonary/Chest: Effort normal and breath sounds normal. No respiratory distress. He has no wheezes. He has no rales.  Diminished lung sounds on the right side  Abdominal: Soft. Bowel sounds are normal. He exhibits no distension. There is no tenderness.  Musculoskeletal: He exhibits no edema or tenderness.  Neurological: He is alert and oriented to person, place, and time. No cranial nerve deficit.  Skin: No rash noted. No erythema.   LABORATORY PANEL:  Male CBC  Recent Labs Lab 04/10/17 0500  WBC 7.8  HGB 9.5*  HCT 30.9*  PLT 231   ------------------------------------------------------------------------------------------------------------------ Chemistries   Recent Labs Lab 04/10/17 0500  NA 133*  K 3.9  CL 96*  CO2 25  GLUCOSE 119*  BUN 46*  CREATININE 4.56*  CALCIUM 8.1*   RADIOLOGY:  Dg Chest 1 View  Result Date: 04/11/2017 CLINICAL DATA:  46 year old male with pleural effusion. EXAM: CHEST 1 VIEW COMPARISON:  Chest radiograph dated 04/08/2017 FINDINGS: Left-sided central venous catheter the tip over central SVC in similar positioning. No significant change in the moderate right pleural effusion. A small left pleural effusions also noted similar to prior radiograph. Right mid to lower lung field airspace density similar to the prior radiograph. No significant interval change in the aeration of the lungs. Left lung base airspace density may represent atelectasis or related to vascular congestion. Pneumonia is not excluded. There is cardiomegaly with progression of vascular congestion. IMPRESSION: 1. Overall no significant interval change in the aeration of the lungs. Moderate right and small left pleural effusion similar to the prior study and associated compressive atelectasis of the lungs.  Pneumonia is not excluded. 2. Stable cardiomegaly with progression of vascular congestion. Electronically Signed   By: Elgie CollardArash  Radparvar M.D.   On: 04/11/2017 05:53   ASSESSMENT  AND PLAN:   46 year old male patient with history of end-stage renal disease on dialysis, congestive heart failure, myocardial infarction, hypertension presented to the emergency room with fall and shortness of breath.  1. acute respiratory failure with hypoxia due to Bilateral pleural effusion and Healthcare associated pneumonia Weaned off oxygen, continue cefepime and vancomycin, and follow-up cultures.  Moderate right pleural effusion. Repeat chest x-ray shows same amount.   Cont to follow  After HD>  2.Abnormal troponin could be secondary to demand ischemia 3.End-stage renal disease on dialysis. Hemodialysis  per Dr. Thedore MinsSingh. 4. Lactic acidosis. Improved with above treatment. 5. HTN. Blood pressure is still low side. Hold lasix if BP is low. 6. Fluid overload- cont HD.  I discussed with Dr. Thedore MinsSingh.  All the records are reviewed and case discussed with Care Management/Social Worker. Management plans discussed with the patient, family and they are in agreement.  CODE STATUS: Full Code  TOTAL TIME TAKING CARE OF THIS PATIENT: 33 minutes.   More than 50% of the time was spent in counseling/coordination of care: YES  POSSIBLE D/C IN 2 DAYS, DEPENDING ON CLINICAL CONDITION.   Altamese DillingVACHHANI, Mariska Daffin M.D on 04/11/2017 at 10:07 PM  Between 7am to 6pm - Pager - [phone].  After 6pm go to [email or web address] - Social research officer, governmentpassword EPAS [facility]  [facility] [place] [facility]  Office  [phone]  CC: Primary care physician; Corky DownsMasoud, Javed, MD  Note: This dictation was prepared with Dragon dictation along with smaller phrase technology. Any transcriptional errors that result from this process are unintentional.

## 2017-04-11 NOTE — Progress Notes (Signed)
[facility]  ROUNDING NOTE   Subjective:     patient remains lethargic but more awake to answer questions Reports pain in his body and back from the recent fall Continues to have significant amount of edema 2400 cc of fluid was removed with dialysis yesterday    Objective:  Vital signs in last 24 hours:  Temp:  [98.2 F (36.8 C)-98.3 F (36.8 C)] 98.2 F (36.8 C) (07/11 0553) Pulse Rate:  [67-83] 69 (07/11 0827) Resp:  [16-18] 18 (07/11 0553) BP: (76-108)/(45-70) 100/68 (07/11 0827) SpO2:  [95 %-100 %] 100 % (07/11 0827)  Weight change:  Filed Weights   04/08/17 2031 04/09/17 0557 04/10/17 0937  Weight: 82.6 kg (182 lb) 88.5 kg (195 lb 3.2 oz) 88.5 kg (195 lb 1.7 oz)    Intake/Output: I/O last 3 completed shifts: In: 3 [I.V.:3] Out: 2400 [Other:2400]   Intake/Output this shift:  No intake/output data recorded.  Physical Exam: General: No acute distress  Head: Normocephalic, atraumatic. Moist oral mucosal membranes  Eyes: Anicteric  Neck: Supple,    Lungs:  Clear, shallow breathing, nasal cannula oxygen  Heart: S1S2 no rubs, irregular  Abdomen:  Soft, nontender, distended, ascites  Extremities: ++ edema.  Neurologic: Lethargic, but wakes up to answer a few questions  Skin: No lesions  Access: L IJ permcath    Basic Metabolic Panel:  Recent Labs Lab 04/05/17 1134 04/08/17 2111 04/09/17 0604 04/10/17 0500  NA 131* 132* 131* 133*  K 4.5 5.6* 5.2* 3.9  CL 94* 94* 95* 96*  CO2 26 23 24 25   GLUCOSE 131* 172* 183* 119*  BUN 48* 59* 64* 46*  CREATININE 3.63* 5.88* 5.96* 4.56*  CALCIUM 8.8* 9.3 8.6* 8.1*  PHOS 6.2*  --   --  8.9*  5.5*    Liver Function Tests:  Recent Labs Lab 04/05/17 1134 04/10/17 0500  ALBUMIN 2.4* 2.3*   No results for input(s): LIPASE, AMYLASE in the last 168 hours. No results for input(s): AMMONIA in the last 168 hours.  CBC:  Recent Labs Lab 04/05/17 1134 04/08/17 2111 04/09/17 0604 04/10/17 0500  WBC  5.2 7.9 8.4 7.8  HGB 8.5* 10.4* 9.0* 9.5*  HCT 27.9* 33.6* 29.0* 30.9*  MCV 84.9 85.7 83.3 85.0  PLT 217 234 208 231    Cardiac Enzymes:  Recent Labs Lab 04/08/17 2111 04/09/17 0604 04/09/17 1105 04/09/17 1753  TROPONINI 0.10* 0.10* 0.09* 0.08*    BNP: Invalid input(s): POCBNP  CBG:  Recent Labs Lab 04/05/17 0746 04/05/17 1633 04/09/17 2122 04/10/17 1340  GLUCAP 139* 120* 161* 101*    Microbiology: Results for orders placed or performed during the hospital encounter of 04/08/17  Blood culture (routine x 2)     Status: None (Preliminary result)   Collection Time: 04/08/17  9:11 PM  Result Value Ref Range Status   Specimen Description BLOOD RIGHT HAND  Final   Special Requests   Final    BOTTLES DRAWN AEROBIC AND ANAEROBIC Blood Culture results may not be optimal due to an inadequate volume of blood received in culture bottles   Culture NO GROWTH 3 DAYS  Final   Report Status PENDING  Incomplete  Blood culture (routine x 2)     Status: None (Preliminary result)   Collection Time: 04/09/17 12:09 AM  Result Value Ref Range Status   Specimen Description BLOOD RIGHT FOREARM  Final   Special Requests   Final    BOTTLES DRAWN AEROBIC AND ANAEROBIC Blood Culture adequate volume  Culture NO GROWTH 2 DAYS  Final   Report Status PENDING  Incomplete  MRSA PCR Screening     Status: Abnormal   Collection Time: 04/09/17 10:50 AM  Result Value Ref Range Status   MRSA by PCR POSITIVE (A) NEGATIVE Final    Comment:        The GeneXpert MRSA Assay (FDA approved for NASAL specimens only), is one component of a comprehensive MRSA colonization surveillance program. It is not intended to diagnose MRSA infection nor to guide or monitor treatment for MRSA infections. RESULT CALLED TO, READ BACK BY AND VERIFIED WITH: CRYSTAL GROGAN ON 04/09/17 AT 1242 JAG     Coagulation Studies: No results for input(s): LABPROT, INR in the last 72 hours.  Urinalysis: No results for  input(s): COLORURINE, LABSPEC, PHURINE, GLUCOSEU, HGBUR, BILIRUBINUR, KETONESUR, PROTEINUR, UROBILINOGEN, NITRITE, LEUKOCYTESUR in the last 72 hours.  Invalid input(s): APPERANCEUR    Imaging: Dg Chest 1 View  Result Date: 04/11/2017 CLINICAL DATA:  46 year old male with pleural effusion. EXAM: CHEST 1 VIEW COMPARISON:  Chest radiograph dated 04/08/2017 FINDINGS: Left-sided central venous catheter the tip over central SVC in similar positioning. No significant change in the moderate right pleural effusion. A small left pleural effusions also noted similar to prior radiograph. Right mid to lower lung field airspace density similar to the prior radiograph. No significant interval change in the aeration of the lungs. Left lung base airspace density may represent atelectasis or related to vascular congestion. Pneumonia is not excluded. There is cardiomegaly with progression of vascular congestion. IMPRESSION: 1. Overall no significant interval change in the aeration of the lungs. Moderate right and small left pleural effusion similar to the prior study and associated compressive atelectasis of the lungs. Pneumonia is not excluded. 2. Stable cardiomegaly with progression of vascular congestion. Electronically Signed   By: Elgie Collard M.D.   On: 04/11/2017 05:53     Medications:   . amiodarone  200 mg Oral Daily  . aspirin EC  325 mg Oral Daily  . Chlorhexidine Gluconate Cloth  6 each Topical Q0600  . feeding supplement (NEPRO CARB STEADY)  237 mL Oral BID WC  . feeding supplement (NEPRO CARB STEADY)  237 mL Oral BID BM  . fluticasone furoate-vilanterol  1 puff Inhalation Daily  . furosemide  40 mg Intravenous Q12H  . heparin  5,000 Units Subcutaneous Q8H  . methimazole  10 mg Oral Daily  . midodrine  10 mg Oral TID WC  . multivitamin  1 tablet Oral QHS  . mupirocin ointment  1 application Nasal BID  . pantoprazole  40 mg Oral Daily  . protein supplement shake  11 oz Oral Q24H  . sodium  chloride flush  3 mL Intravenous Q12H  . timolol  1 drop Right Eye BID      Assessment/ Plan:  46 y.o.white male with End stage renal disease on hemodialysis, hypertension, diabetes mellitus type 2, peripheral neuropathy, atrial fibrillation, multiple episodes of spontaneous bacterial peritonitis, depression, hyperthyroidism, multinodular goiter, h/o cardiac arrest, systolic congestive heart failure   [name]   1. ESRD on HD TTS:   Next hemodialysis scheduled for tomorrow morning We will continue to remove more fluid as tolerated  2. Anemia chronic kidney disease: hemoglobin 9.5 - EPO with HD treatment  3. Secondary hyperparathyroidism:  Recent phosphorus 8.9 Not currently on a binder. will monitor Low phos diet. Avoid dark sodas.   4. Hypotension: with atrial fibrillation. BP support with midodrine and iv albumin  5.  Volume overload, anasarca and ascites, moderate to  large right pleural effusion Fluid removal with dialysis as tolerated.  Support with midodrine and IV albumin    LOS: 2 Amijah Timothy 7/11/20185:07 PM

## 2017-04-11 NOTE — Care Management (Signed)
CM spoke directly with patient about his multiple admissions and possible correlation  with declining post discharge services that have been offered. He offers little to the conversation other than "yes- my head hurts..  my back hurts.. 10 out of 10.  Says he will accept post discharge recommendations. Spoke with daughter [place].  She feels her Dad does need snf placement but had a "terrible experience at  [facility].  He did not even spend the night because the room smelled of urine and staff did not show up in his room for admission for over one hour.  PT and OT consults are pending.

## 2017-04-12 ENCOUNTER — Encounter: Payer: Self-pay | Admitting: *Deleted

## 2017-04-12 DIAGNOSIS — I5022 Chronic systolic (congestive) heart failure: Secondary | ICD-10-CM

## 2017-04-12 DIAGNOSIS — Z992 Dependence on renal dialysis: Secondary | ICD-10-CM

## 2017-04-12 DIAGNOSIS — J189 Pneumonia, unspecified organism: Principal | ICD-10-CM

## 2017-04-12 DIAGNOSIS — Z66 Do not resuscitate: Secondary | ICD-10-CM

## 2017-04-12 DIAGNOSIS — Z7189 Other specified counseling: Secondary | ICD-10-CM

## 2017-04-12 DIAGNOSIS — Z515 Encounter for palliative care: Secondary | ICD-10-CM

## 2017-04-12 DIAGNOSIS — J90 Pleural effusion, not elsewhere classified: Secondary | ICD-10-CM

## 2017-04-12 DIAGNOSIS — N186 End stage renal disease: Secondary | ICD-10-CM

## 2017-04-12 MED ORDER — HYDROMORPHONE HCL 1 MG/ML IJ SOLN: 1.0000 mg | INTRAMUSCULAR | Status: DC | PRN

## 2017-04-12 MED ORDER — ALBUTEROL SULFATE (2.5 MG/3ML) 0.083% IN NEBU: 2.5000 mg | INHALATION_SOLUTION | RESPIRATORY_TRACT | Status: DC | PRN

## 2017-04-12 MED ORDER — GLYCOPYRROLATE 0.2 MG/ML IJ SOLN
0.2000 mg | INTRAMUSCULAR | Status: DC | PRN
  Filled 2017-04-12: qty 1

## 2017-04-12 MED ORDER — PROMETHAZINE HCL 25 MG/ML IJ SOLN: 25.0000 mg | Freq: Four times a day (QID) | INTRAMUSCULAR | Status: DC | PRN

## 2017-04-12 MED ORDER — HYDROMORPHONE HCL 1 MG/ML IJ SOLN
1.0000 mg | INTRAMUSCULAR | Status: DC | PRN
  Administered 2017-04-12 – 2017-04-13 (×2): 1 mg via INTRAVENOUS
  Filled 2017-04-12 (×2): qty 1

## 2017-04-12 MED ORDER — LORAZEPAM 2 MG/ML IJ SOLN: 1.0000 mg | INTRAMUSCULAR | Status: DC | PRN

## 2017-04-12 NOTE — Progress Notes (Signed)
[facility] HD is cancelled Dr. Madelon LipsVacchani said not to give Vancomycin

## 2017-04-12 NOTE — Evaluation (Signed)
Physical Therapy Evaluation Patient Details Name: Juan Roberson. MRN: [id number] DOB: [birth year] Today's Date: 04/12/2017   History of Present Illness  Pt presents to ED with pain, fatigue and generalized weakness after falling down stairs. Upon arrival to ED SOB and hypoxia noted. Pt admitted 04/08/2017 with respiratory failure secondary to HCAP. PMH: CHF, MI, CKD, dysrhythmia, HTN, DVT. Recent admission for HCAP.  Clinical Impression  Prior to admission pt reports ability to perform ADLs and ambulation using a 4-wheel RW. Pt states that he lives alone but has 24 hour availability for assistance from family. Pt demonstrates confusion during session. No family/caregiver present to verify accuracy of the information provided. Pt presenting with nausea/emesis at beginning of session and requests pain and nausea meds (nursing notified and meds delivered via IV during session). Pt requires CGA for supine-to-sit and mod assist +1 for sit-to/from-stand; limited mobility during session due to B LE pain and fatigue. Pt will benefit from skilled PT to address strength, balance, and mobility deficits (see details below). Recommended discharge to SNF.    Follow Up Recommendations SNF    Equipment Recommendations  Rolling walker with 5" wheels    Recommendations for Other Services       Precautions / Restrictions Precautions Precautions: Fall;Other (comment) (contact) Precaution Comments: contact precautions Restrictions Weight Bearing Restrictions: No      Mobility  Bed Mobility Overal bed mobility: Needs Assistance Bed Mobility: Supine to Sit;Sit to Supine     Supine to sit: Min guard Sit to supine: Modified independent (Device/Increase time)   General bed mobility comments: pt initially requires min to mod assist for supine-to-sit due to nausea and "needing to throw-up"; pt able to perform sit-to-supine with increased effort and time; once nausea wore off pt able to perform supine-to-sit  with CGA for safety  Transfers Overall transfer level: Needs assistance Equipment used: Rolling walker (2 wheeled) Transfers: Sit to/from Stand Sit to Stand: Mod assist         General transfer comment: pt requires verbal cueing for hand placement when performing sit-to/from-stand  Ambulation/Gait             General Gait Details: pt able to perform 3 standing marches on B LE (decreased step height on L) with increased time and effort; pt limited to 3 due to pain and pt fatigue  Stairs            Wheelchair Mobility    Modified Rankin (Stroke Patients Only)       Balance Overall balance assessment: Needs assistance Sitting-balance support: Feet supported;No upper extremity supported Sitting balance-Leahy Scale: Good Sitting balance - Comments: pt initially requiring R UE support with L UE holding bag for emesis due to nausea; once nausea subsided, pt able to perform static sitting balance with no UE support, feet supported   Standing balance support: Bilateral upper extremity supported Standing balance-Leahy Scale: Fair Standing balance comment: B UE support via RW and CGA                             Pertinent Vitals/Pain Pain Assessment: 0-10 Pain Score: 10-Worst pain ever Pain Location: head, back Pain Descriptors / Indicators: Aching;Constant;Discomfort;Headache Pain Intervention(s): Limited activity within patient's tolerance;Monitored during session;Repositioned;Patient requesting pain meds-RN notified;RN gave pain meds during session  HR - 78 bpm sitting EOB via pulse ox O2- 84% on RA in attempt to ween off 2L; 92% via nasal cannula (2L) end of session via  pulse ox     Home Living Family/patient expects to be discharged to:: Private residence Living Arrangements: Alone Available Help at Discharge: Family;Available 24 hours/day Type of Home: House Home Access: Level entry     Home Layout: One level Home Equipment: Bedside commode;Grab  bars - tub/shower;Walker - 4 wheels Additional Comments: pt demonstrated some confusion throughout session; no family or caregivers present to verify accuracy of information obtained.    Prior Function Level of Independence: Independent with assistive device(s)         Comments: able to perform ADLs and ambulation with 4 wheel RW.     Hand Dominance        Extremity/Trunk Assessment   Upper Extremity Assessment Upper Extremity Assessment: Overall WFL for tasks assessed    Lower Extremity Assessment Lower Extremity Assessment: Generalized weakness    Cervical / Trunk Assessment Cervical / Trunk Assessment: Normal  Communication   Communication: No difficulties  Cognition Arousal/Alertness: Awake/alert Behavior During Therapy: Flat affect Overall Cognitive Status: No family/caregiver present to determine baseline cognitive functioning                                 General Comments: pt oriented to person, year, month; pt able to state he was at hospital end of session (unable to beginning of session); pt not oriented to day of the week      General Comments      Exercises Total Joint Exercises Marching in Standing: AROM;Strengthening;Both;Other reps (comment);Standing;Limitations (3 reps B LE with increased time and effort to perform and decreased L LE step height) Marching in Standing Limitations: B LE pain, pt fatigue   Assessment/Plan    PT Assessment Patient needs continued PT services  PT Problem List Decreased strength;Decreased activity tolerance;Decreased balance;Decreased mobility;Decreased cognition;Decreased knowledge of use of DME;Pain       PT Treatment Interventions DME instruction;Gait training;Functional mobility training;Therapeutic activities;Therapeutic exercise;Balance training;Patient/family education    PT Goals (Current goals can be found in the Care Plan section)  Acute Rehab PT Goals Patient Stated Goal: pt would like to  be able to return home and return to ambulating PT Goal Formulation: With patient Time For Goal Achievement: 04/26/17 Potential to Achieve Goals: Fair    Frequency Min 2X/week   Barriers to discharge        Co-evaluation               AM-PAC PT "6 Clicks" Daily Activity  Outcome Measure Difficulty turning over in bed (including adjusting bedclothes, sheets and blankets)?: A Little Difficulty moving from lying on back to sitting on the side of the bed? : Total Difficulty sitting down on and standing up from a chair with arms (e.g., wheelchair, bedside commode, etc,.)?: Total Help needed moving to and from a bed to chair (including a wheelchair)?: A Lot Help needed walking in hospital room?: A Lot Help needed climbing 3-5 steps with a railing? : Total 6 Click Score: 10    End of Session Equipment Utilized During Treatment: Gait belt;Oxygen (2 L O2) Activity Tolerance: Patient limited by fatigue;Patient limited by pain Patient left: in bed;with call bell/phone within reach;with bed alarm set Nurse Communication: Mobility status;Patient requests pain meds;Precautions;Weight bearing status (pain meds administered during session, others via whiteboard) PT Visit Diagnosis: Unsteadiness on feet (R26.81);Other abnormalities of gait and mobility (R26.89);Repeated falls (R29.6);Muscle weakness (generalized) (M62.81);History of falling (Z91.81)    Time: [id number] PT Time Calculation (min) (  ACUTE ONLY): 48 min   Charges:         PT G CodesGwenlyn Found, SPT 04-17-17,11:20 AM [phone]

## 2017-04-12 NOTE — Progress Notes (Signed)
[facility]  ROUNDING NOTE   Subjective:     patient remains lethargic but wakes up to answer questions Reports pain in his body and headache Continues to have significant amount of edema Vomited this morning.  Reports nausea.    Objective:  Vital signs in last 24 hours:  Temp:  [97.4 F (36.3 C)-98.3 F (36.8 C)] 97.7 F (36.5 C) (07/12 1238) Pulse Rate:  [79-94] 80 (07/12 1238) Resp:  [10-18] 10 (07/12 1238) BP: (102-138)/(66-82) 102/66 (07/12 1238) SpO2:  [84 %-100 %] 95 % (07/12 1238)  Weight change:  Filed Weights   04/08/17 2031 04/09/17 0557 04/10/17 0937  Weight: 82.6 kg (182 lb) 88.5 kg (195 lb 3.2 oz) 88.5 kg (195 lb 1.7 oz)    Intake/Output: I/O last 3 completed shifts: In: 123 [I.V.:23; IV Piggyback:100] Out: 25 [Urine:25]   Intake/Output this shift:  No intake/output data recorded.  Physical Exam: General: No acute distress  Head: Normocephalic, atraumatic. Moist oral mucosal membranes  Eyes: Anicteric  Neck: Supple,    Lungs:  Clear, shallow breathing, nasal cannula oxygen  Heart: S1S2 no rubs, irregular  Abdomen:  Soft, nontender, distended, ascites  Extremities: ++ edema.  Neurologic: Lethargic, but wakes up to answer a few questions  Skin: No lesions  Access: L IJ permcath    Basic Metabolic Panel:  Recent Labs Lab 04/08/17 2111 04/09/17 0604 04/10/17 0500  NA 132* 131* 133*  K 5.6* 5.2* 3.9  CL 94* 95* 96*  CO2 23 24 25   GLUCOSE 172* 183* 119*  BUN 59* 64* 46*  CREATININE 5.88* 5.96* 4.56*  CALCIUM 9.3 8.6* 8.1*  PHOS  --   --  8.9*  5.5*    Liver Function Tests:  Recent Labs Lab 04/10/17 0500  ALBUMIN 2.3*   No results for input(s): LIPASE, AMYLASE in the last 168 hours. No results for input(s): AMMONIA in the last 168 hours.  CBC:  Recent Labs Lab 04/08/17 2111 04/09/17 0604 04/10/17 0500  WBC 7.9 8.4 7.8  HGB 10.4* 9.0* 9.5*  HCT 33.6* 29.0* 30.9*  MCV 85.7 83.3 85.0  PLT 234 208 231     Cardiac Enzymes:  Recent Labs Lab 04/08/17 2111 04/09/17 0604 04/09/17 1105 04/09/17 1753  TROPONINI 0.10* 0.10* 0.09* 0.08*    BNP: Invalid input(s): POCBNP  CBG:  Recent Labs Lab 04/05/17 1633 04/09/17 2122 04/10/17 1340  GLUCAP 120* 161* 101*    Microbiology: Results for orders placed or performed during the hospital encounter of 04/08/17  Blood culture (routine x 2)     Status: None (Preliminary result)   Collection Time: 04/08/17  9:11 PM  Result Value Ref Range Status   Specimen Description BLOOD RIGHT HAND  Final   Special Requests   Final    BOTTLES DRAWN AEROBIC AND ANAEROBIC Blood Culture results may not be optimal due to an inadequate volume of blood received in culture bottles   Culture NO GROWTH 4 DAYS  Final   Report Status PENDING  Incomplete  Blood culture (routine x 2)     Status: None (Preliminary result)   Collection Time: 04/09/17 12:09 AM  Result Value Ref Range Status   Specimen Description BLOOD RIGHT FOREARM  Final   Special Requests   Final    BOTTLES DRAWN AEROBIC AND ANAEROBIC Blood Culture adequate volume   Culture NO GROWTH 3 DAYS  Final   Report Status PENDING  Incomplete  MRSA PCR Screening     Status: Abnormal   Collection  Time: 04/09/17 10:50 AM  Result Value Ref Range Status   MRSA by PCR POSITIVE (A) NEGATIVE Final    Comment:        The GeneXpert MRSA Assay (FDA approved for NASAL specimens only), is one component of a comprehensive MRSA colonization surveillance program. It is not intended to diagnose MRSA infection nor to guide or monitor treatment for MRSA infections. RESULT CALLED TO, READ BACK BY AND VERIFIED WITH: CRYSTAL GROGAN ON 04/09/17 AT 1242 JAG     Coagulation Studies: No results for input(s): LABPROT, INR in the last 72 hours.  Urinalysis: No results for input(s): COLORURINE, LABSPEC, PHURINE, GLUCOSEU, HGBUR, BILIRUBINUR, KETONESUR, PROTEINUR, UROBILINOGEN, NITRITE, LEUKOCYTESUR in the last 72  hours.  Invalid input(s): APPERANCEUR    Imaging: Dg Chest 1 View  Result Date: 04/11/2017 CLINICAL DATA:  46 year old male with pleural effusion. EXAM: CHEST 1 VIEW COMPARISON:  Chest radiograph dated 04/08/2017 FINDINGS: Left-sided central venous catheter the tip over central SVC in similar positioning. No significant change in the moderate right pleural effusion. A small left pleural effusions also noted similar to prior radiograph. Right mid to lower lung field airspace density similar to the prior radiograph. No significant interval change in the aeration of the lungs. Left lung base airspace density may represent atelectasis or related to vascular congestion. Pneumonia is not excluded. There is cardiomegaly with progression of vascular congestion. IMPRESSION: 1. Overall no significant interval change in the aeration of the lungs. Moderate right and small left pleural effusion similar to the prior study and associated compressive atelectasis of the lungs. Pneumonia is not excluded. 2. Stable cardiomegaly with progression of vascular congestion. Electronically Signed   By: Elgie Collard M.D.   On: 04/11/2017 05:53     Medications:   . amiodarone  200 mg Oral Daily  . aspirin EC  325 mg Oral Daily  . Chlorhexidine Gluconate Cloth  6 each Topical Q0600  . feeding supplement (NEPRO CARB STEADY)  237 mL Oral BID WC  . feeding supplement (NEPRO CARB STEADY)  237 mL Oral BID BM  . fluticasone furoate-vilanterol  1 puff Inhalation Daily  . furosemide  40 mg Intravenous Q12H  . heparin  5,000 Units Subcutaneous Q8H  . methimazole  10 mg Oral Daily  . midodrine  10 mg Oral TID WC  . multivitamin  1 tablet Oral QHS  . mupirocin ointment  1 application Nasal BID  . pantoprazole  40 mg Oral Daily  . protein supplement shake  11 oz Oral Q24H  . sodium chloride flush  3 mL Intravenous Q12H  . timolol  1 drop Right Eye BID      Assessment/ Plan:  46 y.o.white male with End stage renal  disease on hemodialysis, hypertension, diabetes mellitus type 2, peripheral neuropathy, atrial fibrillation, multiple episodes of spontaneous bacterial peritonitis, depression, hyperthyroidism, multinodular goiter, h/o cardiac arrest, systolic congestive heart failure   [name]   1. ESRD on HD TTS:   Next hemodialysis scheduled for later today We will continue to remove more fluid as tolerated  2. Anemia chronic kidney disease: hemoglobin 9.5 - EPO with HD treatment  3. Secondary hyperparathyroidism:  Recent phosphorus 8.9 Not currently on a binder. will monitor Low phos diet. Avoid dark sodas.   4. Hypotension: with atrial fibrillation. BP support with midodrine and iv albumin  5.  Volume overload, anasarca and ascites, moderate to  large right pleural effusion Fluid removal with dialysis as tolerated.  Support with midodrine and IV albumin  6.Headaches  ? Trial of low dose iv toradol     LOS: 3 Sheli Dorin 7/12/20181:27 PM

## 2017-04-12 NOTE — Plan of Care (Signed)
Problem: Pain Managment: Goal: General experience of comfort will improve Outcome: Not Progressing Pt continues to have pain in his head from his fall, treating with morphine x 2 and also tylenol once, has helped some. But still has some pain. Will continue to monitor.  Problem: Tissue Perfusion: Goal: Risk factors for ineffective tissue perfusion will decrease Outcome: Progressing Heparin subcutaneously for VTE

## 2017-04-12 NOTE — Consult Note (Signed)
Consultation Note Date: 04/12/2017   Patient Name: Juan Roberson.  DOB: 1971/06/24  MRN: [id number]  Age / Sex: 46 y.o., male  PCP: Cletis [place], MD Referring Physician: Vaughan Basta, *  Reason for Consultation: Establishing goals of care  HPI/Patient Profile: 46 y.o. male  with past medical history of ESRD on HD, CHF, MI, HTN, DM, DVT, osteomyelitis, cellulitis, and cardiac arrest admitted on 04/08/2017 with weakness and fall. The patient has had 10 hospitalizations and 4 ED visits in the last six months due to multiple co-morbidities and recurrent SBP and pneumonia. This admission, CT head negative for acute findings. Chest xray revealed pneumonia, fluid overload, and pleural effusions. Also abnormal troponin likely secondary to demand ischemia. HD every Tuesday, Thursday, Saturday. On 7/12, patient with increased drowsiness/lethargy and shallow respirations. Palliative medicine consultation for goals of care.   Clinical Assessment and Goals of Care: I have reviewed medical records, discussed with care team, and met with patient (he has been intermittently confused) and multiple family members and friends to discuss diagnosis, prognosis, GOC, EOL wishes, disposition and options. Initially met in family waiting room with family/friends. Then spoke with patient, two daughters, and grandfather at bedside. Daughters are [place] and [place].   Introduced Palliative Medicine as specialized medical care for people living with serious illness. It focuses on providing relief from the symptoms and stress of a serious illness.  We discussed a brief life review of the patient. No spouse. Three daughters (one who is 71 years old). Patient lives with [place] who has been his primary caregiver since his health declined. Daughters speak of his decline since this past January when he was started on dialysis. He was  off for a short time but was re-started in April. They recap his frequent hospitalizations due to fluid overload and infection.  Patient recently discharged one week ago. He went with family to the beach for their annual trip. While at the beach, he fell backwards and hit his head. Daughter took him to ED where CT was negative. He continued to "not act himself" and she drove him back to [facility] and brought him straight to the hospital.    Discussed hospital diagnoses, interventions, and underlying heart and kidney issues. We discussed this continuing to be a cycle of re-hospitalization with progressive heart and kidney failure now affecting his lungs/liver.   I attempted to elicit values and goals of care important to the patient. Glynda Jaeger, and multiple family members speak of him stating "I am tired" on a daily basis. They are tearful thinking about "losing him" but "don't want to see him suffer any longer." They feel he has been "in a daze" since the beach trip. Andee Poles speaks of feeling as if he knew that was his last time at the beach. Other family members share that he feels like a "burden on the family."   Advanced directives, concepts specific to code status, artifical feeding and hydration, and rehospitalization were considered and discussed. He does not have a documented HCPOA or  living will. [place] has been primary Actuary but her and Andee Poles are in constant communication regarding his care. They tell me he has not spoken of his wishes regarding EOL but they feel he is ready "to give him" because "he tells [place] he is tired."   The difference between aggressive medical intervention and comfort care was discussed. Daughters and family do NOT want to see him suffer any longer. They declined dialysis today and moving forward. They do not want him resuscitated or placed on life support. They "want to make him comfortable." Andee Poles feels he does not have quality of life  which family agrees with.   Discussed hospice options--home versus inpatient hospice facility. Daughters decide he will be best managed at the hospice facility. Educated on transition to comfort measures only with focus on comfort, quality, and dignity at EOL. Educated on focus of symptom management and discontinuation of meds/labs/interventions not aimed at comfort. Discussed DNR/DNI status and prognosis with discontinuation of dialysis. Daughters agree along with other family members.   **Following this conversation, Glynda Jaeger, their grandfather, and I visited with patient. At this time, he was alert and able to participate in the conversation. He tells [place] he is tired. He tells me he is not afraid of dying and is "ready to go to heaven." He c/o headache that is relieved by dilaudid IV. (Morphine does not help). Discussed focus on comfort and symptom management moving forward. He states "I will go" (to the hospice home). Grandfather, who is a Theme park manager, shares many bible verses and prayers that comfort patient and daughters.   Answered all questions and concerns for family. Provided emotional and spiritual support.     SUMMARY OF RECOMMENDATIONS    DNR/DNI  Comfort measures only. Patient/daughters do not want further dialysis. Discontinued labs/medications/interventions not aimed at comfort. Notified Dr. Anselm Jungling.   Symptom management--see below.  Discussed hospice options. Daughters request inpatient hospice facility. Discussed with SW.   Possible transfer to hospice facility tomorrow if stable. Will f/u in AM.   Code Status/Advance Care Planning:  DNR  Symptom Management:   Dilaudid '1mg'$  IV q2h prn pain/dyspnea/air hunger (Will avoid morphine in ESRD and pain not relieved by morphine per patient/family).   Ativan '1mg'$  IV q4h anxiety  Robinul 0.'2mg'$  IV q4h secretions  Palliative Prophylaxis:   Aspiration, Delirium Protocol, Frequent Pain Assessment, Oral Care and Turn  Reposition  Additional Recommendations (Limitations, Scope, Preferences):  Full Comfort Care  Psycho-social/Spiritual:   Desire for further Chaplaincy support:yes  Additional Recommendations: Caregiving  Support/Resources, Compassionate Wean Education and Education on Hospice  Prognosis:   < 2 weeks: ESRD discontinuing dialysis. Also with CHF EF 30%, pleural effusions, fluid overload, pneumonia, and recurrent SBP.   Discharge Planning: Hospice facility: symptom management for pain, dyspnea, and anxiety      Primary Diagnoses: Present on Admission: . HCAP (healthcare-associated pneumonia) . Hypoxia   I have reviewed the medical record, interviewed the patient and family, and examined the patient. The following aspects are pertinent.  Past Medical History:  Diagnosis Date  . Cellulitis and abscess of foot    Left-Dr. Vickki Muff  . CHF (congestive heart failure) (HCC)    EF 20%  . CKD (chronic kidney disease), stage III   . Diabetes mellitus without complication ([place])    a. Dx ~ 1996.  [name] Dysrhythmia   . Essential hypertension   . Gastritis    a. 04/2015 hematemesis -> EGD: gastritis, esophagitis, duodenitis.  No active bleeding.  PPI added.  [name]  GERD (gastroesophageal reflux disease)   . Left leg DVT ([place])    a. Dx 05/2015 -> Coumadin.  . MI (myocardial infarction) ([place])   . Osteomyelitis ([place])    a. 05/2015 L foot.   Social History   Social History  . Marital status: Single    Spouse name: N/A  . Number of children: 3  . Years of education: N/A   Occupational History  . unemployed    Social History Main Topics  . Smoking status: Never Smoker  . Smokeless tobacco: Never Used  . Alcohol use No  . Drug use: No  . Sexual activity: No   Other Topics Concern  . None   Social History Narrative   Single, lives alone, 3 children   Family History  Problem Relation Age of Onset  . Coronary artery disease Father   . Pancreatic cancer Mother   . Breast cancer  Sister   . Lung cancer Brother   . Cervical cancer Sister    Scheduled Meds: . furosemide  40 mg Intravenous Q12H  . midodrine  10 mg Oral TID WC  . sodium chloride flush  3 mL Intravenous Q12H  . timolol  1 drop Right Eye BID   Continuous Infusions: . sodium chloride     PRN Meds:.sodium chloride, acetaminophen **OR** acetaminophen, albuterol, glycopyrrolate, HYDROmorphone (DILAUDID) injection, LORazepam, ondansetron **OR** ondansetron (ZOFRAN) IV, promethazine, senna-docusate, sodium chloride flush, zolpidem Medications Prior to Admission:  Prior to Admission medications   Medication Sig Start Date End Date Taking? Authorizing Provider  amiodarone (PACERONE) 200 MG tablet Take 1 tablet (200 mg total) by mouth daily. 09/25/16  Yes Demetrios Loll, MD  aspirin EC 325 MG tablet Take 325 mg by mouth daily. 03/05/17  Yes [provider]  cefpodoxime Bennie Pierini) 200 MG tablet Take 1 tablet (200 mg total) by mouth every Tuesday, Thursday, and Saturday at 6 PM. Take in the evening after dialysis- life long 03/10/17  Yes Gladstone Lighter, MD  fluticasone furoate-vilanterol (BREO ELLIPTA) 100-25 MCG/INH AEPB Inhale 1 puff into the lungs daily.   Yes [provider]  furosemide (LASIX) 80 MG tablet Take 80 mg by mouth 2 (two) times daily. 03/21/17  Yes [provider]  insulin aspart (NOVOLOG) 100 UNIT/ML injection Inject 4 Units into the skin 3 (three) times daily with meals. 10/31/16  Yes Gladstone Lighter, MD  methimazole (TAPAZOLE) 10 MG tablet Take 1 tablet (10 mg total) by mouth daily. 07/22/16  Yes Wieting, Richard, MD  midodrine (PROAMATINE) 10 MG tablet Take 1 tablet (10 mg total) by mouth 3 (three) times daily with meals. 03/24/17  Yes Sainani, Belia Heman, MD  pantoprazole (PROTONIX) 40 MG tablet Take 1 tablet (40 mg total) by mouth daily. 09/25/16  Yes Demetrios Loll, MD  sertraline (ZOLOFT) 50 MG tablet Take 50 mg by mouth daily.   Yes [provider]  timolol  (TIMOPTIC) 0.5 % ophthalmic solution Place 1 drop into the right eye 2 (two) times daily. 02/16/17  Yes [provider]  traMADol (ULTRAM) 50 MG tablet Take 50 mg by mouth every 6 (six) hours. 02/13/17  Yes [provider]  zolpidem (AMBIEN) 5 MG tablet Take 5 mg by mouth at bedtime as needed for sleep.  11/14/16  Yes [provider]  aspirin EC 81 MG EC tablet Take 1 tablet (81 mg total) by mouth daily. Patient not taking: Reported on 04/09/2017 07/23/16   Loletha Grayer, MD  multivitamin (RENA-VIT) TABS tablet Take 1 tablet by  mouth at bedtime. Patient not taking: Reported on 04/09/2017 01/19/17   Fritzi Mandes, MD   Allergies  Allergen Reactions  . Sulfur Other (See Comments)    Pt states that this medication causes renal failure.    . Hydrocodone Itching  . Oxycodone Itching  . Tramadol Itching   Review of Systems  Unable to perform ROS: Acuity of condition   Physical Exam  Constitutional: He is easily aroused. He appears ill.  HENT:  Head: Normocephalic and atraumatic.  Cardiovascular: Regular rhythm.   Pulmonary/Chest: No accessory muscle usage. No tachypnea. No respiratory distress. He has decreased breath sounds.  shallow  Abdominal: Normal appearance.  Neurological: He is alert and easily aroused.  Pleasantly confused  Skin: Skin is warm and dry. There is pallor.  Psychiatric: His speech is delayed. He is withdrawn. He exhibits a depressed mood.  Nursing note and vitals reviewed.  Vital Signs: BP 102/66 (BP Location: Right Arm)   Pulse 80   Temp 97.7 F (36.5 C) (Oral)   Resp 10   Ht '6\' 2"'$  (1.88 m)   Wt 88.5 kg (195 lb 1.7 oz)   SpO2 95%   BMI 25.05 kg/m  Pain Assessment: 0-10   Pain Score: 10-Worst pain ever   SpO2: SpO2: 95 % O2 Device:SpO2: 95 % O2 Flow Rate: .O2 Flow Rate (L/min): 2 L/min  IO: Intake/output summary:   Intake/Output Summary (Last 24 hours) at 04/12/17 1557 Last data filed at 04/12/17 1430  Gross per 24 hour    Intake              448 ml  Output               25 ml  Net              423 ml    LBM: Last BM Date: 04/10/17 Baseline Weight: Weight: 82.6 kg (182 lb) Most recent weight: Weight: 88.5 kg (195 lb 1.7 oz)     Palliative Assessment/Data: PPS 40%   Flowsheet Rows     Most Recent Value  Intake Tab  Referral Department  Hospitalist  Unit at Time of Referral  Cardiac/Telemetry Unit  Palliative Care Primary Diagnosis  Other (Comment) [ESRD, CHF, recurrent SBP, pneumonia]  Palliative Care Type  New Palliative care  Reason for referral  Clarify Goals of Care, End of Life Care Assistance  Date first seen by Palliative Care  04/12/17  Clinical Assessment  Palliative Performance Scale Score  40%  Psychosocial & Spiritual Assessment  Palliative Care Outcomes  Patient/Family meeting held?  Yes  Who was at the meeting?  patient, two daughters, multiple family members and friends  Palliative Care Outcomes  Clarified goals of care, Provided end of life care assistance, Provided psychosocial or spiritual support, Improved pain interventions, Improved non-pain symptom therapy, Counseled regarding hospice, Changed to focus on comfort, Changed CPR status, ACP counseling assistance, Transitioned to hospice      Time In: 1440 Time Out: 1600 Time Total: 80 min Greater than 50%  of this time was spent counseling and coordinating care related to the above assessment and plan.  Signed by:  Ihor Dow, FNP-C Palliative Medicine Team  Phone: [phone] Fax: [phone]   Please contact Palliative Medicine Team phone at [phone] for questions and concerns.  For individual provider: See Shea Evans

## 2017-04-12 NOTE — Clinical Social Work Note (Signed)
CSW spoke to palliative who met with patient's family they have decided full comfort care, palliative is recommending hospice facility placement.  CSW spoke to patient's daughter Cain Sieve, [phone] who is requesting [facility].  CSW contacted [facility] nurse liaison who will review patient's information and meet with family tomorrow.  Jones Broom. Norval Morton, MSW, [place]  04/12/2017 4:27 PM

## 2017-04-12 NOTE — Care Management (Signed)
Palliative consult pending for possible decision to pursue comfort care.

## 2017-04-12 NOTE — Progress Notes (Signed)
Dr. Madelon LipsVacchani discussed Comfort Care with the family.  Based on that  discussion the daughter doesn't want him to have dialysis this afternoon.  She will meet later with palliative care.

## 2017-04-12 NOTE — Progress Notes (Signed)
OT Cancellation Note  Patient Details Name: Queen BlossomWaltzie L Bohall Jr. MRN: [id number] DOB: 09/16/1971   Cancelled Treatment:    Reason Eval/Treat Not Completed: Patient at procedure or test/ unavailable (Pt. currently with PT, and nursing. Will reattempt at a later time as pt. is avaiable.)  Olegario MessierElaine Sylas Twombly, MS, OTR/L 04/12/2017, 9:37 AM

## 2017-04-12 NOTE — Progress Notes (Signed)
[facility] [place] [facility]   PATIENT NAME: Juan Roberson    MR#:  [id number]  DATE OF BIRTH:  August 17, 1971  SUBJECTIVE:  CHIEF COMPLAINT:   Chief Complaint  Patient presents with  . Fall  . Head Injury  . Weakness   Patient is drowsy today and having shallow bleedings. He opens eyes but does not talk much on arousal.  REVIEW OF SYSTEMS:  Review of Systems  Reason unable to perform ROS: mental status change.    DRUG ALLERGIES:   Allergies  Allergen Reactions  . Sulfur Other (See Comments)    Pt states that this medication causes renal failure.    . Hydrocodone Itching  . Oxycodone Itching  . Tramadol Itching   VITALS:  Blood pressure 102/66, pulse 80, temperature 97.7 F (36.5 C), temperature source Oral, resp. rate 10, height 6\' 2"  (1.88 m), weight 88.5 kg (195 lb 1.7 oz), SpO2 95 %. PHYSICAL EXAMINATION:  Physical Exam  Constitutional:  Appears in distressed and drowsy  HENT:  Head: Normocephalic.  Eyes: Conjunctivae and EOM are normal. No scleral icterus.  Neck: Normal range of motion. Neck supple. No JVD present. No tracheal deviation present.  Cardiovascular: Normal rate, regular rhythm and normal heart sounds.  Exam reveals no gallop.   No murmur heard. Pulmonary/Chest: Breath sounds normal. He is in respiratory distress. He has no wheezes. He has no rales.  Diminished lung sounds on the right side, have shallow breathing.  Abdominal: Soft. Bowel sounds are normal. He exhibits distension. There is no tenderness.  Musculoskeletal: He exhibits no edema or tenderness.  Neurological:  Pt is drowsy and opens eyes but does not talk much on arousal.  Skin: No rash noted. No erythema.   LABORATORY PANEL:  Male CBC  Recent Labs Lab 04/10/17 0500  WBC 7.8  HGB 9.5*  HCT 30.9*  PLT 231   ------------------------------------------------------------------------------------------------------------------ Chemistries   Recent  Labs Lab 04/10/17 0500  NA 133*  K 3.9  CL 96*  CO2 25  GLUCOSE 119*  BUN 46*  CREATININE 4.56*  CALCIUM 8.1*   RADIOLOGY:  No results found. ASSESSMENT AND PLAN:   46 year old male patient with history of end-stage renal disease on dialysis, congestive heart failure, myocardial infarction, hypertension presented to the emergency room with fall and shortness of breath.  1. acute respiratory failure with hypoxia due to Bilateral pleural effusion and Healthcare associated pneumonia Weaned off oxygen, continue cefepime and vancomycin, and follow-up cultures. Moderate right pleural effusion. Repeat chest x-ray shows same amount.   Cont to follow  After HD   Not much improvement overall, and patient had this recurrent infections, bacteremia, as BP so finally family and his wedge pressures and good friends decided today to have a meeting with prenatal care and possibly deciding on comfort care.  2.Abnormal troponin could be secondary to demand ischemia 3.End-stage renal disease on dialysis. Hemodialysis  per Dr. Thedore Mins. 4. Lactic acidosis. Improved with above treatment. 5. HTN. Blood pressure is still low side. Hold lasix if BP is low. 6. Fluid overload- cont HD.  I discussed with Dr. Thedore Mins.  All the records are reviewed and case discussed with Care Management/Social Worker. Management plans discussed with the patient, family and they are in agreement.  CODE STATUS: DNR  TOTAL TIME TAKING CARE OF THIS PATIENT: 45 minutes.   More than 50% of the time was spent in counseling/coordination of care: YES  POSSIBLE D/C IN 2 DAYS, DEPENDING ON CLINICAL CONDITION.  Altamese DillingVACHHANI, Legacy Carrender M.D on 04/12/2017 at 2:11 PM  Between 7am to 6pm - Pager - [phone].  After 6pm go to [email or web address] - Social research officer, governmentpassword [email or web address]  [facility] [place] [facility]  Office  [phone]  CC: Primary care physician; Corky DownsMasoud, Javed, MD  Note: This dictation was prepared with Dragon dictation  along with smaller phrase technology. Any transcriptional errors that result from this process are unintentional. [phone]

## 2017-04-12 NOTE — Progress Notes (Signed)
C/o vomiting.  Had no breakfast this morning.   Was getting ready to work with PT.  No warning ahead of time.  Ondansetron   4 mg given slow IVP.  O2 sats on 2L [place] was 98 %.  On R/A it was 84%.

## 2017-04-12 NOTE — Progress Notes (Signed)
OT Cancellation Note  Patient Details Name: Juan BlossomWaltzie L Helbert Jr. MRN: [id number] DOB: 12/24/1970   Cancelled Treatment:    Reason Eval/Treat Not Completed: Medical issues which prohibited therapy (Pt. family in the process of having pt. likely transition to Palliative for comfort care. HD, and vancomycin stopped this afternoon. OT will continue to monitor and intervene as appropriate.)  Olegario MessierElaine Kassidy Frankson, MS, OTR/L 04/12/2017, 3:02 PM

## 2017-04-12 NOTE — Progress Notes (Signed)
Family Meeting Note  Advance Directive:no  Today a meeting took place with the daughter and good friends.  Patient is unable to participate due BJ:[id number]:Lacked capacity drowsiness.   The following clinical team members were present during this meeting:MD  The following were discussed:Patient's diagnosis: ESRD, CHF, recurrent infections, decreased oral intake, mental status change , Patient's progosis: < 4 weeks and Goals for treatment: DNR  Additional follow-up to be provided: palliative care consult. After discussing with family and friends,t hey want him DNR and may convert to comfort care.  Time spent during discussion:20 minutes  Arturo Sofranko, Heath GoldVAIBHAVKUMAR, MD

## 2017-04-12 NOTE — Progress Notes (Signed)
Notified MD of lasix order. Per MD hold lasix for this evening. D/C caridac monitoring. Will continue to monitor and assess.

## 2017-04-13 LAB — CULTURE, BLOOD (ROUTINE X 2): CULTURE: NO GROWTH

## 2017-04-13 MED ORDER — HYDROMORPHONE HCL 1 MG/ML IJ SOLN: 1.0000 mg | INTRAMUSCULAR | 0 refills | Status: AC | PRN

## 2017-04-13 MED ORDER — GLYCOPYRROLATE 0.2 MG/ML IJ SOLN: 0.2000 mg | INTRAMUSCULAR | 0 refills | Status: AC | PRN

## 2017-04-13 NOTE — Progress Notes (Signed)
Daily Progress Note   Patient Name: Juan BlossomWaltzie L Moronta Jr.       Date: 04/13/2017 DOB: 03/05/1971  Age: 46 y.o. MRN#: [id number] Attending Physician: Altamese DillingVachhani, Vaibhavkumar, * Primary Care Physician: Corky DownsMasoud, Javed, MD Admit Date: 04/08/2017  Reason for Consultation/Follow-up: Establishing goals of care  Subjective: Patient resting upon arrival to room. Wakes to voice. C/o 8/10 headache from fall. Tells me he is "ready to leave."   Length of Stay: 4  Current Medications: Scheduled Meds:  . furosemide  40 mg Intravenous Q12H  . midodrine  10 mg Oral TID WC  . sodium chloride flush  3 mL Intravenous Q12H  . timolol  1 drop Right Eye BID    Continuous Infusions: . sodium chloride      PRN Meds: sodium chloride, acetaminophen **OR** acetaminophen, albuterol, glycopyrrolate, HYDROmorphone (DILAUDID) injection, LORazepam, ondansetron **OR** ondansetron (ZOFRAN) IV, promethazine, senna-docusate, sodium chloride flush, zolpidem  Physical Exam  Constitutional: He is easily aroused. He appears ill.  HENT:  Head: Normocephalic and atraumatic.  Cardiovascular: Regular rhythm.   Pulmonary/Chest: No accessory muscle usage. No tachypnea. No respiratory distress. He has decreased breath sounds.  Abdominal: Normal appearance.  Musculoskeletal: He exhibits edema (generalized).  Neurological: He is alert and easily aroused.  Confused/drowsy  Skin: Skin is warm and dry.  Psychiatric: His speech is delayed. He is withdrawn.  Nursing note and vitals reviewed.          Vital Signs: BP 107/73 (BP Location: Right Arm)   Pulse 90   Temp 97.7 F (36.5 C) (Oral)   Resp 10   Ht 6\' 2"  (1.88 m)   Wt 88.5 kg (195 lb 1.7 oz)   SpO2 96%   BMI 25.05 kg/m  SpO2: SpO2: 96 % O2 Device: O2 Device: Nasal  Cannula O2 Flow Rate: O2 Flow Rate (L/min): 2 L/min  Intake/output summary:  Intake/Output Summary (Last 24 hours) at 04/13/17 0858 Last data filed at 04/13/17 0119  Gross per 24 hour  Intake              325 ml  Output                0 ml  Net              325 ml   LBM: Last BM Date:  04/10/17 Baseline Weight: Weight: 82.6 kg (182 lb) Most recent weight: Weight: 88.5 kg (195 lb 1.7 oz)       Palliative Assessment/Data: PPS 40%   Flowsheet Rows     Most Recent Value  Intake Tab  Referral Department  Hospitalist  Unit at Time of Referral  Cardiac/Telemetry Unit  Palliative Care Primary Diagnosis  Other (Comment) [ESRD, CHF, recurrent SBP, pneumonia]  Palliative Care Type  New Palliative care  Reason for referral  Clarify Goals of Care, End of Life Care Assistance  Date first seen by Palliative Care  04/12/17  Clinical Assessment  Palliative Performance Scale Score  40%  Psychosocial & Spiritual Assessment  Palliative Care Outcomes  Patient/Family meeting held?  Yes  Who was at the meeting?  patient, two daughters, multiple family members and friends  Palliative Care Outcomes  Clarified goals of care, Provided end of life care assistance, Provided psychosocial or spiritual support, Improved pain interventions, Improved non-pain symptom therapy, Counseled regarding hospice, Changed to focus on comfort, Changed CPR status, ACP counseling assistance, Transitioned to hospice      Patient Active Problem List   Diagnosis Date Noted  . ESRD on hemodialysis (HCC)   . Palliative care by specialist   . Goals of care, counseling/discussion   . DNR (do not resuscitate)   . Hypoxia 04/09/2017  . Pressure injury of skin 04/04/2017  . Healthcare-associated pneumonia 04/03/2017  . Chronic systolic congestive heart failure (HCC) 04/03/2017  . HTN (hypertension) 04/03/2017  . GERD (gastroesophageal reflux disease) 04/03/2017  . Hyperkalemia 03/21/2017  . Pleural effusion 01/25/2017  .  CHF exacerbation (HCC) 01/14/2017  . CKD (chronic kidney disease), stage III 01/09/2017  . SBP (spontaneous bacterial peritonitis) (HCC) 12/05/2016  . Hyperglycemia 10/28/2016  . Sepsis (HCC) 10/17/2016  . Chest pain 09/24/2016  . Acute-on-chronic kidney injury (HCC) 09/24/2016    Palliative Care Assessment & Plan   Patient Profile: 46 y.o. male  with past medical history of ESRD on HD, CHF, MI, HTN, DM, DVT, osteomyelitis, cellulitis, and cardiac arrest admitted on 04/08/2017 with weakness and fall. The patient has had 10 hospitalizations and 4 ED visits in the last six months due to multiple co-morbidities and recurrent SBP and pneumonia. This admission, CT head negative for acute findings. Chest xray revealed pneumonia, fluid overload, and pleural effusions. Also abnormal troponin likely secondary to demand ischemia. HD every Tuesday, Thursday, Saturday. On 7/12, patient with increased drowsiness/lethargy and shallow respirations. Palliative medicine consultation for goals of care.   Assessment: Acute respiratory failure with hypoxia Bilateral pleural effusions Healthcare associated pneumonia ESRD on hemodialysis Congestive heart failure Elevated troponin likely due to demand ischemia Recurrent SBP  Recommendations/Plan:  DNR/DNI  Comfort measures only  Continue medications as needed for pain/dyspnea/air hunger/anxiety  Hospice liaison to meet with patient/family today. Stable for transfer to hospice home today if bed available.   Goals of Care and Additional Recommendations:  Limitations on Scope of Treatment: Full Comfort Care  Code Status: DNR   Code Status Orders        Start     Ordered   04/12/17 1543  Do not attempt resuscitation (DNR)  Continuous    Question Answer Comment  In the event of cardiac or respiratory ARREST Do not call a "code blue"   In the event of cardiac or respiratory ARREST Do not perform Intubation, CPR, defibrillation or ACLS   In the  event of cardiac or respiratory ARREST Use medication by any route, position, wound care,  and other measures to relive pain and suffering. May use oxygen, suction and manual treatment of airway obstruction as needed for comfort.      04/12/17 1542    Code Status History    Date Active Date Inactive Code Status Order ID Comments User Context   04/09/2017  5:13 AM 04/12/2017  3:42 PM Full Code [id number]  Ihor Austin, MD Inpatient   04/04/2017 12:57 AM 04/05/2017  8:11 PM Full Code [id number]  Oralia Manis, MD Inpatient   03/21/2017  4:43 PM 03/25/2017  3:15 PM Full Code [id number]  Enid Baas, MD Inpatient   03/07/2017 11:09 PM 03/09/2017  6:50 PM Full Code [id number]  Hugelmeyer, Alexis, DO Inpatient   02/06/2017  1:55 PM 02/09/2017  6:54 PM Full Code [id number]  Houston Siren, MD ED   01/25/2017  1:08 PM 01/27/2017  6:10 PM Full Code [id number]  Houston Siren, MD Inpatient   01/14/2017  6:18 AM 01/19/2017  9:31 PM Full Code [id number]  Ihor Austin, MD Inpatient   12/05/2016  8:01 PM 12/08/2016  3:11 PM Full Code [id number]  Altamese Dilling, MD Inpatient   10/28/2016  8:42 PM 10/31/2016  8:35 PM Full Code [id number]  Houston Siren, MD Inpatient   10/17/2016  8:18 PM 10/27/2016  6:48 PM Full Code [id number]  Auburn Bilberry, MD Inpatient   09/24/2016  3:36 PM 09/25/2016  2:15 PM Full Code [id number]  Wyatt Haste, MD ED   07/21/2016 11:05 AM 07/22/2016  3:11 PM Full Code [id number]  Alford Highland, MD ED   02/28/2016  1:43 AM 03/01/2016  6:06 PM Full Code [id number]  Arnaldo Natal, MD Inpatient   12/06/2015  8:39 PM 12/08/2015  2:51 PM Full Code [id number]  Altamese Dilling, MD Inpatient   11/01/2015  4:00 PM 11/03/2015  2:28 PM Full Code [id number]  Hower, Cletis Athens, MD ED   06/29/2015  2:30 AM 07/01/2015  2:21 PM Full Code [id number]  Arnaldo Natal, MD Inpatient   05/21/2015  4:34 PM 05/25/2015  4:34 PM Full Code [id number]  Linus Galas, MD Inpatient   05/18/2015  9:17 PM 05/21/2015  4:34 PM Full Code  [id number]  Shaune Pollack, MD Inpatient   04/05/2015  7:05 AM 04/08/2015  2:00 PM Full Code [id number]  Arnaldo Natal, MD Inpatient       Prognosis:   < 2 weeks  Discharge Planning:  Hospice facility  Care plan was discussed with patient, daughter, MD, RN, hospice liaison  Thank you for allowing the Palliative Medicine Team to assist in the care of this patient.   Time In: 0840 Time Out: 0900 Total Time Prolonged Time Billed  no      Greater than 50%  of this time was spent counseling and coordinating care related to the above assessment and plan.  Vennie Homans, FNP-C Palliative Medicine Team  Phone: [phone] Fax: [phone]  Please contact Palliative Medicine Team phone at [phone] for questions and concerns.

## 2017-04-13 NOTE — Progress Notes (Signed)
New hospice home referral received from [facility]. Patient is a 46 year old man with a PMH of ESRD on HD, CHF, MI, HTN, DM, DVT, osteomyelitis, cellulitis, and cardiac arrest admitted on 04/08/2017 with weakness and fall. The patient has had 10 hospitalizations and 4 ED visits in the last six months due to multiple co-morbidities and recurrent SBP and pneumonia. This admission, CT head negative for acute findings. Chest xray revealed pneumonia, fluid overload, and pleural effusions as well as abnormal troponin. On the morning of 7/12 he became more lethargic and Palliative Medicine was consulted for goals of care. Patient and family met with Palliative Medicine yesterday afternoon and have chosen to focus on his comfort with transfer to the hospice home, no further dialysis to be performed. Patient has required IV dilaudid and zofran for symptom management. Writer met in the room with patient's daughter Cain Sieve and her husband Jenny Reichmann to initiate education regarding hospice services, with understanding voiced. Questions answered, consents signed. Patient information faxed to referral. Report called to the hospice home. EMS notified for transport. Family and Hospital care team all aware. Signed DNR in place in patient's discharge summary. Thank you. Flo Shanks RN, BSN, [facility] [place],  hospital liaison [phone]

## 2017-04-13 NOTE — Discharge Instructions (Signed)
Paracentesis, Care After °Refer to this sheet in the next few weeks. These instructions provide you with information about caring for yourself after your procedure. Your health care provider may also give you more specific instructions. Your treatment has been planned according to current medical practices, but problems sometimes occur. Call your health care provider if you have any problems or questions after your procedure. °What can I expect after the procedure? °After your procedure, it is common to have a small amount of clear fluid coming from the puncture site. °Follow these instructions at home: °· Return to your normal activities as told by your health care provider. Ask your health care provider what activities are safe for you. °· Take over-the-counter and prescription medicines only as told by your health care provider. °· Do not take baths, swim, or use a hot tub until your health care provider approves. °· Follow instructions from your health care provider about: °? How to take care of your puncture site. °? When and how you should change your bandage (dressing). °? When you should remove your dressing. °· Check your puncture area every day signs of infection. Watch for: °? Redness, swelling, or pain. °? Fluid, blood, or pus. °· Keep all follow-up visits as told by your health care provider. This is important. °Contact a health care provider if: °· You have redness, swelling, or pain at your puncture site. °· You start to have more clear fluid coming from your puncture site. °· You have blood or pus coming from your puncture site. °· You have chills. °· You have a fever. °Get help right away if: °· You develop chest pain or shortness of breath. °· You develop increasing pain, discomfort, or swelling in your abdomen. °· You feel dizzy or light-headed or you pass out. °This information is not intended to replace advice given to you by your health care provider. Make sure you discuss any questions you  have with your health care provider. °Document Released: 02/02/2015 Document Revised: 02/24/2016 Document Reviewed: 12/01/2014 °Elsevier Interactive Patient Education © 2018 [employer] ° °

## 2017-04-13 NOTE — Discharge Summary (Signed)
[facility] [place] [facility]   PATIENT NAME: Juan Roberson    MR#:  [id number]  DATE OF BIRTH:  [birth year]  DATE OF ADMISSION:  04/08/2017 ADMITTING PHYSICIAN: Ihor Austin, MD  DATE OF DISCHARGE: 04/13/2017  PRIMARY CARE PHYSICIAN: Corky Downs, MD    ADMISSION DIAGNOSIS:  Lactic acidosis [E87.2] Pleural effusion [J90] Hypoxia [R09.02] Healthcare-associated pneumonia [J18.9] Elevated troponin [R74.8] Injury of head, initial encounter [S09.90XA] HCAP (healthcare-associated pneumonia) [J18.9]  DISCHARGE DIAGNOSIS:  Active Problems:   Healthcare-associated pneumonia   Hypoxia   ESRD on hemodialysis [facility])   Palliative care by specialist   Goals of care, counseling/discussion   DNR (do not resuscitate)   SECONDARY DIAGNOSIS:   Past Medical History:  Diagnosis Date  . Cellulitis and abscess of foot    Left-Dr. Ether Griffins  . CHF (congestive heart failure) (HCC)    EF 20%  . CKD (chronic kidney disease), stage III   . Diabetes mellitus without complication (HCC)    a. Dx ~ 1996.  [name] Dysrhythmia   . Essential hypertension   . Gastritis    a. 04/2015 hematemesis -> EGD: gastritis, esophagitis, duodenitis.  No active bleeding.  PPI added.  [name] GERD (gastroesophageal reflux disease)   . Left leg DVT (HCC)    a. Dx 05/2015 -> Coumadin.  . MI (myocardial infarction) (HCC)   . Osteomyelitis (HCC)    a. 05/2015 L foot.    HOSPITAL COURSE:   46 year old male patient with history of end-stage renal disease on dialysis, congestive heart failure, myocardial infarction, hypertension presented to the emergency room with fall and shortness of breath.  1.acute respiratory failure with hypoxia due to Bilateral pleural effusionand Healthcare associated pneumonia Weaned off oxygen, continue cefepime and vancomycin, and follow-up cultures. Moderate right pleural effusion. Repeat chest x-ray shows same amount.   Cont to follow  After HD   Not much  improvement overall, and patient had this recurrent infections, bacteremia, as BP so finally family and his wedge pressures and good friends decided today to have a meeting with prenatal care and possibly deciding on comfort care. D/c to hospice home today.  2.Abnormal troponin could be secondary to demand ischemia 3.End-stage renal disease on dialysis. Hemodialysis  per Dr. Thedore Mins. 4. Lactic acidosis. Improved with above treatment. 5. HTN. Blood pressure is still low side. Hold lasix if BP is low. 6. Fluid overload- cont HD.  DISCHARGE CONDITIONS:   Stable.  CONSULTS OBTAINED:  Treatment Team:  Mosetta Pigeon, MD Mick Sell, MD  DRUG ALLERGIES:   Allergies  Allergen Reactions  . Sulfur Other (See Comments)    Pt states that this medication causes renal failure.    . Hydrocodone Itching  . Oxycodone Itching  . Tramadol Itching    DISCHARGE MEDICATIONS:   Current Discharge Medication List    START taking these medications   Details  glycopyrrolate (ROBINUL) 0.2 MG/ML injection Inject 1 mL (0.2 mg total) into the vein every 4 (four) hours as needed (secretions). Qty: 5 mL, Refills: 0    HYDROmorphone (DILAUDID) 1 MG/ML injection Inject 1 mL (1 mg total) into the vein every 2 (two) hours as needed for moderate pain or severe pain (dyspnea/air hunger). Qty: 1 mL, Refills: 0      CONTINUE these medications which have NOT CHANGED   Details  timolol (TIMOPTIC) 0.5 % ophthalmic solution Place 1 drop into the right eye 2 (two) times daily. Refills: 3      STOP taking these  medications     amiodarone (PACERONE) 200 MG tablet      aspirin EC 325 MG tablet      cefpodoxime (VANTIN) 200 MG tablet      fluticasone furoate-vilanterol (BREO ELLIPTA) 100-25 MCG/INH AEPB      furosemide (LASIX) 80 MG tablet      insulin aspart (NOVOLOG) 100 UNIT/ML injection      methimazole (TAPAZOLE) 10 MG tablet      midodrine (PROAMATINE) 10 MG tablet      pantoprazole  (PROTONIX) 40 MG tablet      sertraline (ZOLOFT) 50 MG tablet      traMADol (ULTRAM) 50 MG tablet      zolpidem (AMBIEN) 5 MG tablet      aspirin EC 81 MG EC tablet      multivitamin (RENA-VIT) TABS tablet          DISCHARGE INSTRUCTIONS:    Follow as needed, comfort care in hospice home.  If you experience worsening of your admission symptoms, develop shortness of breath, life threatening emergency, suicidal or homicidal thoughts you must seek medical attention immediately by calling 911 or calling your MD immediately  if symptoms less severe.  You Must read complete instructions/literature along with all the possible adverse reactions/side effects for all the Medicines you take and that have been prescribed to you. Take any new Medicines after you have completely understood and accept all the possible adverse reactions/side effects.   Please note  You were cared for by a hospitalist during your hospital stay. If you have any questions about your discharge medications or the care you received while you were in the hospital after you are discharged, you can call the unit and asked to speak with the hospitalist on call if the hospitalist that took care of you is not available. Once you are discharged, your primary care physician will handle any further medical issues. Please note that NO REFILLS for any discharge medications will be authorized once you are discharged, as it is imperative that you return to your primary care physician (or establish a relationship with a primary care physician if you do not have one) for your aftercare needs so that they can reassess your need for medications and monitor your lab values.    Today   CHIEF COMPLAINT:   Chief Complaint  Patient presents with  . Fall  . Head Injury  . Weakness    HISTORY OF PRESENT ILLNESS:  Juan Roberson  is a 46 y.o. male with a known history of End-stage renal disease on dialysis congestive heart failure with  EF of 20%, hypertension, osteomyelitis, myocardial infarction went to the beach at [place]. Yesterday he fell down and hit the back of his head when he was walking down steps. No loss of consciousness. Patient has abrasion over the back of the head and felt weak and presented to the emergency room. Was worked up with a CT head and CT cervical spine which showed no acute abnormality. Patient missed dialysis on Saturday. He usually gets dialyzed on Tuesday Thursday and Saturday. He complains of shortness of breath and was hypoxic in the emergency room was put on Lanoxin by nasal cannula. He was worked up with chest x-ray which showed a pneumonia and pleural effusion with fluid overload bedside lactic acid level was elevated and patient was given broad-spectrum antibiotics IV vancomycin and cefepime. Hospitalist service was consulted for the care of the patient.   VITAL SIGNS:  Blood pressure 99/67, pulse  82, temperature 97.7 F (36.5 C), temperature source Oral, resp. rate (!) 9, height 6\' 2"  (1.88 m), weight 88.5 kg (195 lb 1.7 oz), SpO2 98 %.  I/O:   Intake/Output Summary (Last 24 hours) at 04/13/17 1207 Last data filed at 04/13/17 0119  Gross per 24 hour  Intake              325 ml  Output                0 ml  Net              325 ml    PHYSICAL EXAMINATION:  Appears in no distressed and drowsy  HENT:  Head: Normocephalic.  Eyes: Conjunctivae and EOM are normal. No scleral icterus.  Neck: Normal range of motion. Neck supple. No JVD present. No tracheal deviation present.  Cardiovascular: Normal rate, regular rhythm and normal heart sounds.  Exam reveals no gallop.   No murmur heard. Pulmonary/Chest: Breath sounds normal. He is in respiratory distress. He has no wheezes. He has no rales.  Diminished lung sounds on the right side, have shallow breathing.  Abdominal: Soft. Bowel sounds are normal. He exhibits distension. There is no tenderness.  Musculoskeletal: He exhibits no edema or  tenderness.  Neurological:  Pt is drowsy and opens eyes but does not talk much on arousal.  Skin: No rash noted. No erythema.   DATA REVIEW:   CBC  Recent Labs Lab 04/10/17 0500  WBC 7.8  HGB 9.5*  HCT 30.9*  PLT 231    Chemistries   Recent Labs Lab 04/10/17 0500  NA 133*  K 3.9  CL 96*  CO2 25  GLUCOSE 119*  BUN 46*  CREATININE 4.56*  CALCIUM 8.1*    Cardiac Enzymes  Recent Labs Lab 04/09/17 1753  TROPONINI 0.08*    Microbiology Results  Results for orders placed or performed during the hospital encounter of 04/08/17  Blood culture (routine x 2)     Status: None   Collection Time: 04/08/17  9:11 PM  Result Value Ref Range Status   Specimen Description BLOOD RIGHT HAND  Final   Special Requests   Final    BOTTLES DRAWN AEROBIC AND ANAEROBIC Blood Culture results may not be optimal due to an inadequate volume of blood received in culture bottles   Culture NO GROWTH 5 DAYS  Final   Report Status 04/13/2017 FINAL  Final  Blood culture (routine x 2)     Status: None (Preliminary result)   Collection Time: 04/09/17 12:09 AM  Result Value Ref Range Status   Specimen Description BLOOD RIGHT FOREARM  Final   Special Requests   Final    BOTTLES DRAWN AEROBIC AND ANAEROBIC Blood Culture adequate volume   Culture NO GROWTH 4 DAYS  Final   Report Status PENDING  Incomplete  MRSA PCR Screening     Status: Abnormal   Collection Time: 04/09/17 10:50 AM  Result Value Ref Range Status   MRSA by PCR POSITIVE (A) NEGATIVE Final    Comment:        The GeneXpert MRSA Assay (FDA approved for NASAL specimens only), is one component of a comprehensive MRSA colonization surveillance program. It is not intended to diagnose MRSA infection nor to guide or monitor treatment for MRSA infections. RESULT CALLED TO, READ BACK BY AND VERIFIED WITH: CRYSTAL GROGAN ON 04/09/17 AT 1242 JAG     RADIOLOGY:  No results found.  EKG:   Orders placed or  performed during the  hospital encounter of 04/08/17  . ED EKG  . ED EKG      Management plans discussed with the patient, family and they are in agreement.  CODE STATUS:     Code Status Orders        Start     Ordered   04/12/17 1543  Do not attempt resuscitation (DNR)  Continuous    Question Answer Comment  In the event of cardiac or respiratory ARREST Do not call a "code blue"   In the event of cardiac or respiratory ARREST Do not perform Intubation, CPR, defibrillation or ACLS   In the event of cardiac or respiratory ARREST Use medication by any route, position, wound care, and other measures to relive pain and suffering. May use oxygen, suction and manual treatment of airway obstruction as needed for comfort.      04/12/17 1542    Code Status History    Date Active Date Inactive Code Status Order ID Comments User Context   04/09/2017  5:13 AM 04/12/2017  3:42 PM Full Code [id number]  Ihor AustinPyreddy, Pavan, MD Inpatient   04/04/2017 12:57 AM 04/05/2017  8:11 PM Full Code [id number]  Oralia ManisWillis, David, MD Inpatient   03/21/2017  4:43 PM 03/25/2017  3:15 PM Full Code [id number]  Enid BaasKalisetti, Radhika, MD Inpatient   03/07/2017 11:09 PM 03/09/2017  6:50 PM Full Code [id number]  Hugelmeyer, Alexis, DO Inpatient   02/06/2017  1:55 PM 02/09/2017  6:54 PM Full Code [id number]  Houston SirenSainani, Vivek J, MD ED   01/25/2017  1:08 PM 01/27/2017  6:10 PM Full Code [id number]  Houston SirenSainani, Vivek J, MD Inpatient   01/14/2017  6:18 AM 01/19/2017  9:31 PM Full Code [id number]  Ihor AustinPyreddy, Pavan, MD Inpatient   12/05/2016  8:01 PM 12/08/2016  3:11 PM Full Code [id number]  Altamese DillingVachhani, Ramond Darnell, MD Inpatient   10/28/2016  8:42 PM 10/31/2016  8:35 PM Full Code [id number]  Houston SirenSainani, Vivek J, MD Inpatient   10/17/2016  8:18 PM 10/27/2016  6:48 PM Full Code [id number]  Auburn BilberryPatel, Shreyang, MD Inpatient   09/24/2016  3:36 PM 09/25/2016  2:15 PM Full Code [id number]  Wyatt HasteHower, David K, MD ED   07/21/2016 11:05 AM 07/22/2016  3:11 PM Full Code [id number]  Alford HighlandWieting, Richard, MD ED   02/28/2016   1:43 AM 03/01/2016  6:06 PM Full Code [id number]  Arnaldo Nataliamond, Michael S, MD Inpatient   12/06/2015  8:39 PM 12/08/2015  2:51 PM Full Code [id number]  Altamese DillingVachhani, Avaiah Stempel, MD Inpatient   11/01/2015  4:00 PM 11/03/2015  2:28 PM Full Code [id number]  Hower, Cletis Athensavid K, MD ED   06/29/2015  2:30 AM 07/01/2015  2:21 PM Full Code [id number]  Arnaldo Nataliamond, Michael S, MD Inpatient   05/21/2015  4:34 PM 05/25/2015  4:34 PM Full Code [id number]  Linus Galasline, Todd, MD Inpatient   05/18/2015  9:17 PM 05/21/2015  4:34 PM Full Code [id number]  Shaune Pollackhen, Qing, MD Inpatient   04/05/2015  7:05 AM 04/08/2015  2:00 PM Full Code [id number]  Arnaldo Nataliamond, Michael S, MD Inpatient      TOTAL TIME TAKING CARE OF THIS PATIENT: 35 minutes.    Altamese DillingVACHHANI, Treyvion Durkee M.D on 04/13/2017 at 12:07 PM  Between 7am to 6pm - Pager - [phone]  After 6pm go to [email or web address] - password [email or web address]  [facility] [place] [facility]  Office  [phone]  CC: Primary care physician; Corky DownsMasoud, Javed, MD   Note: This dictation was prepared with Dragon dictation along with smaller phrase technology. Any transcriptional errors  that result from this process are unintentional.

## 2017-04-13 NOTE — Progress Notes (Signed)
Discharging to inpt hospice care. External jugular IV left in place for med administration. HD cath in Left chest wall remaining in place as having it removed would delay his discharge.  Dressing for for caths were changed prior to discharge

## 2017-04-13 NOTE — Clinical Social Work Note (Signed)
[facility].                            .  Patient and family agreeable to plans will transport via ems nurse liason RN to call report.  Windell MouldingEric Janelie Goltz, MSW, Theresia MajorsLCSWA [phone]

## 2017-04-14 LAB — CULTURE, BLOOD (ROUTINE X 2)
Culture: NO GROWTH
Special Requests: ADEQUATE

## 2017-04-17 ENCOUNTER — Ambulatory Visit
Admission: RE | Admit: 2017-04-17 | Discharge: 2017-04-17 | Disposition: A | Discharge status: Home / Self Care | Payer: BLUE CROSS/BLUE SHIELD | Source: Ambulatory Visit | Attending: Nephrology | Admitting: Nephrology

## 2017-05-02 DEATH — deceased

## 2017-10-18 IMAGING — US US PARACENTESIS
2 series · 13 of 13 positions shown · non-contrast
Comparison: none

INDICATION: Ascites

[Series 1: us paracentesis · 0.28mm/px · 8 of 8 slices shown (1 of 2)]
[im 1/8]
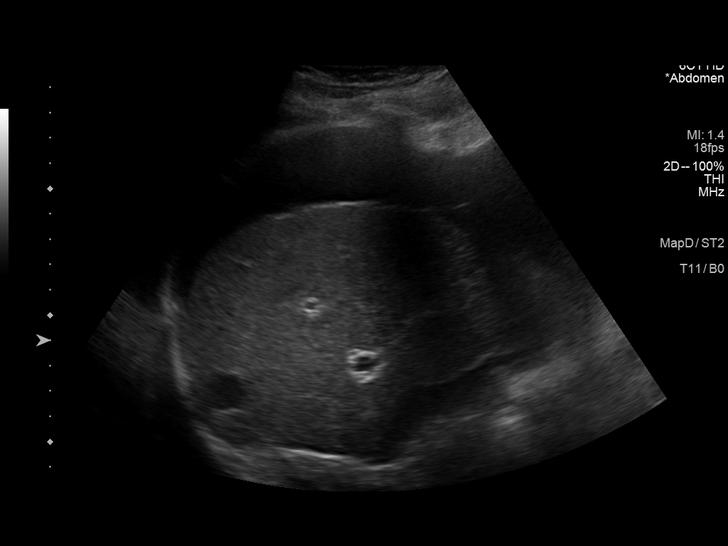
[im 2/8]
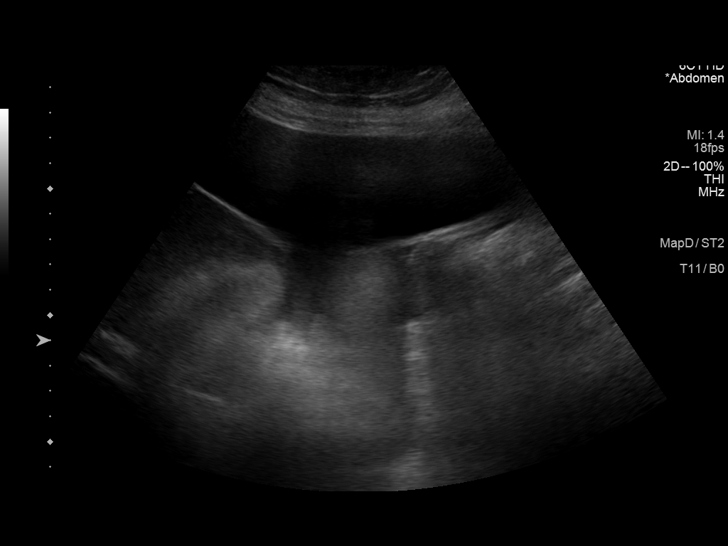
[im 3/8]
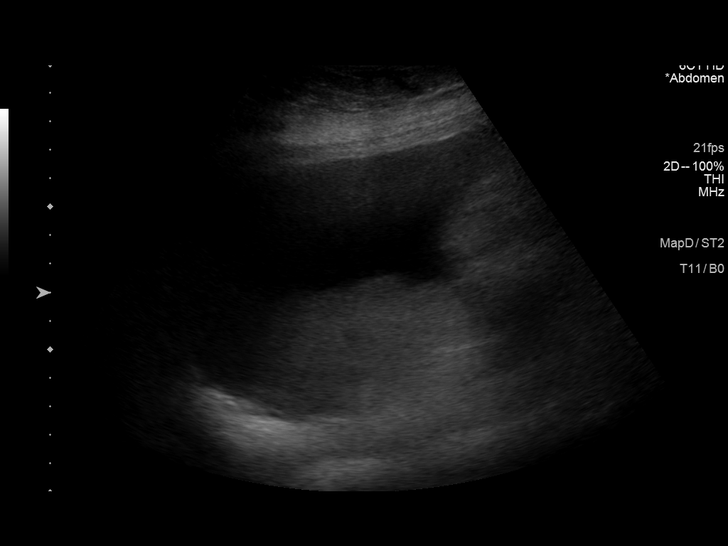
[im 4/8]
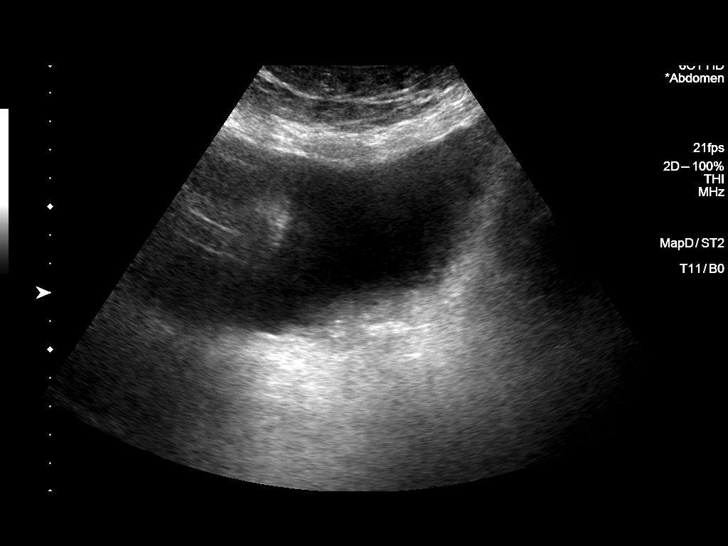
[im 5/8]
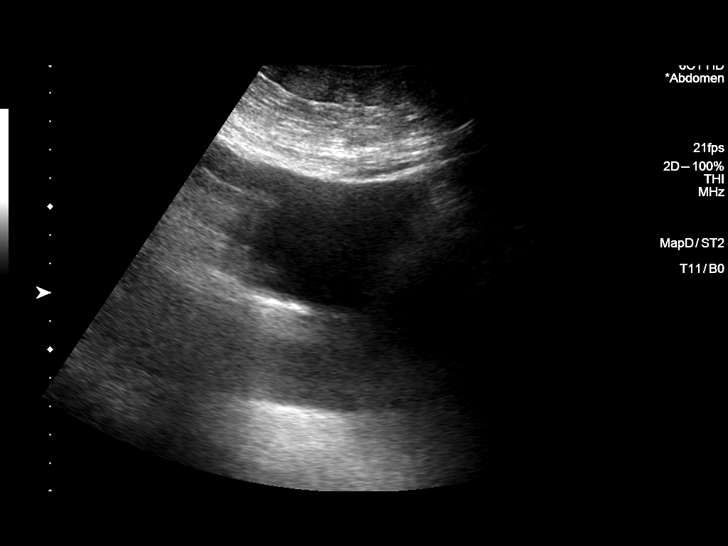
[im 6/8]
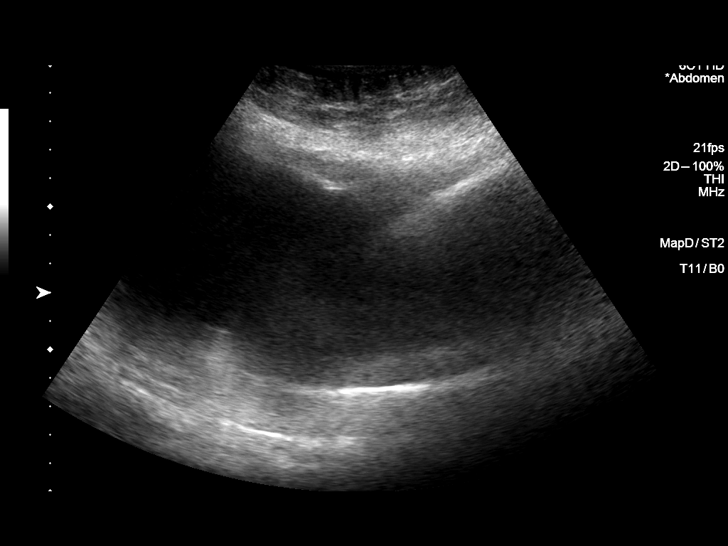
[im 7/8]
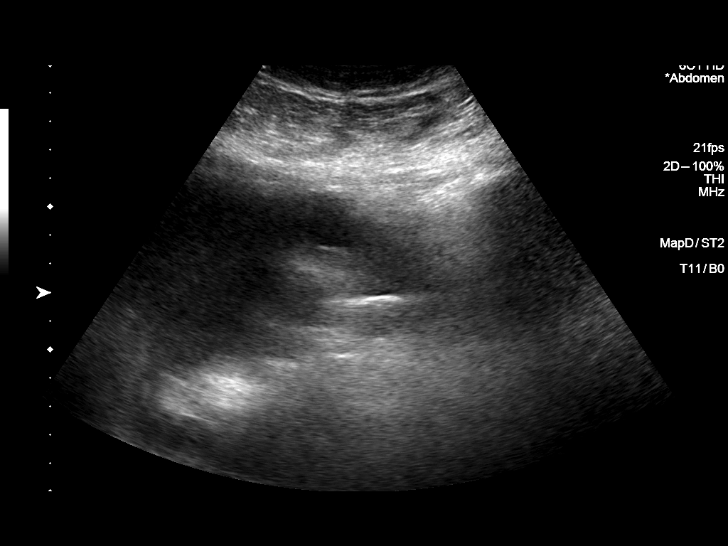
[im 8/8]
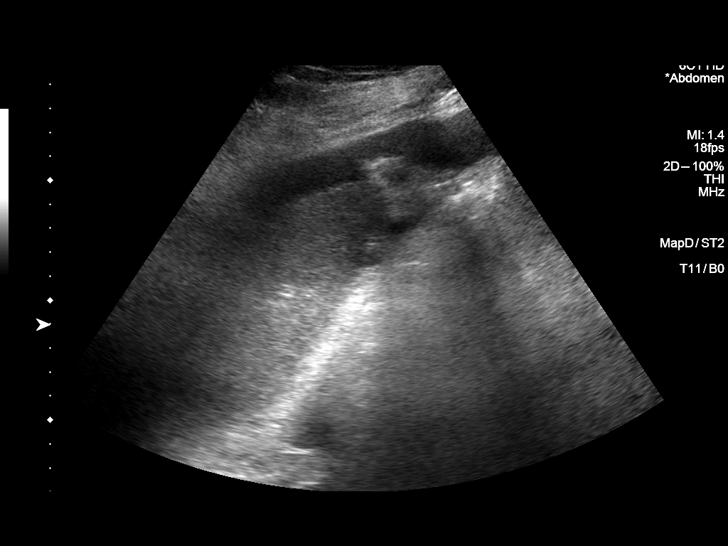

[Series 1: us paracentesis · 0.26mm/px · 5 of 5 slices shown (2 of 2)]
[im 1/5]
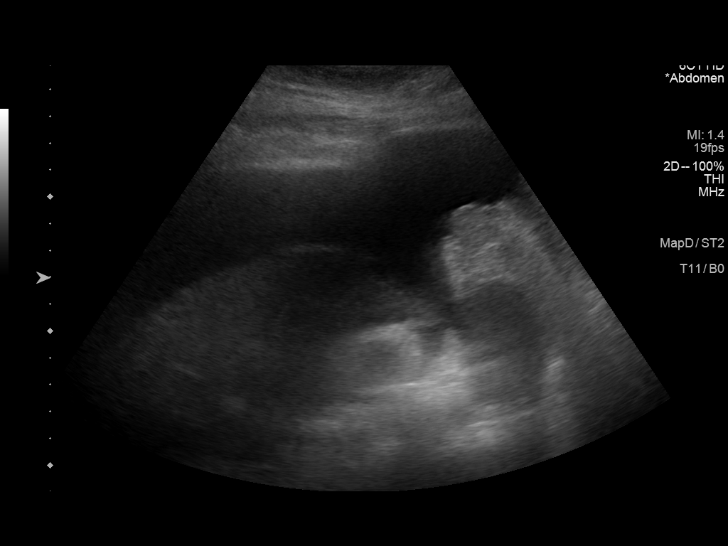
[im 2/5]
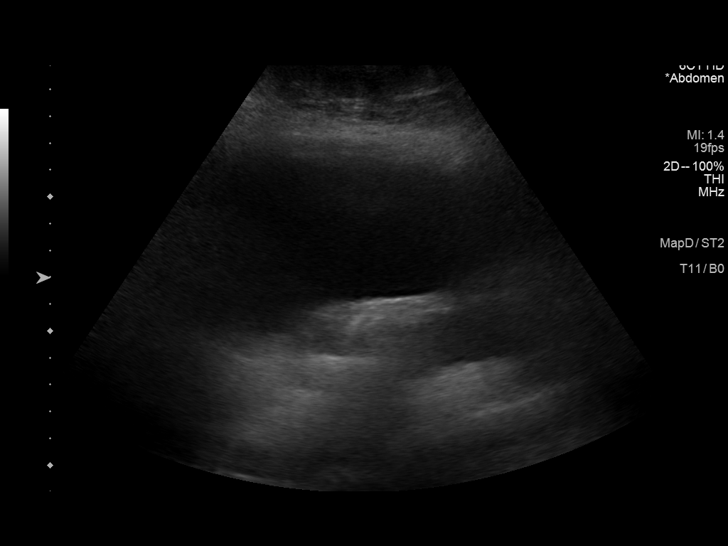
[im 3/5]
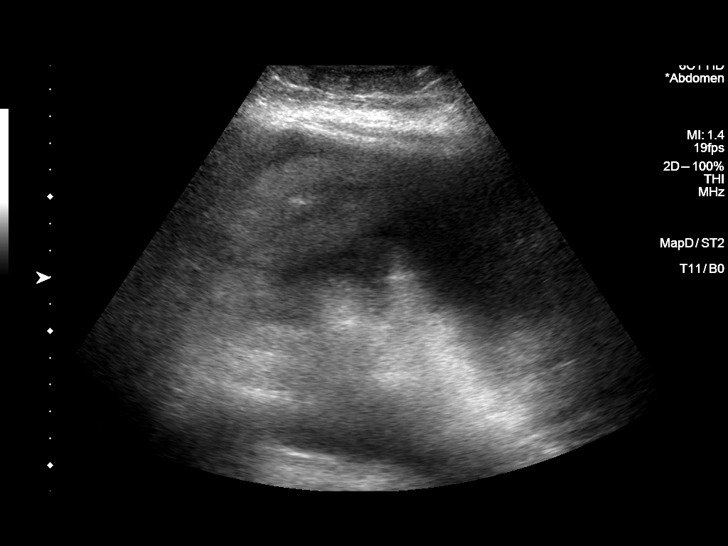
[im 4/5]
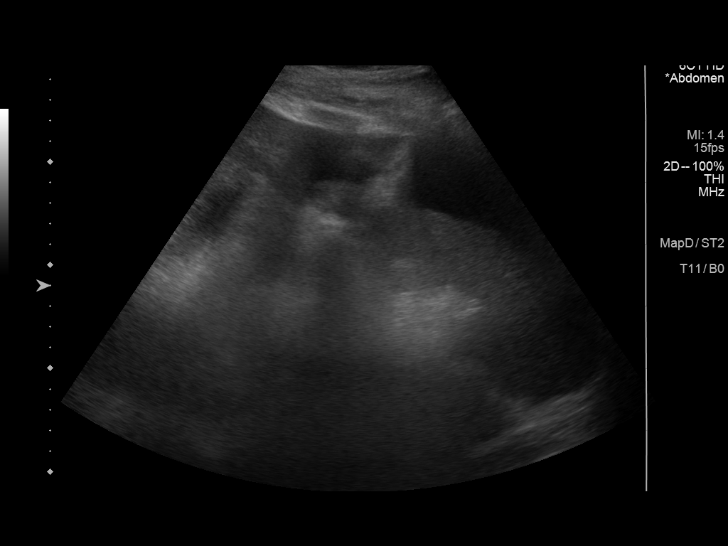
[im 5/5]
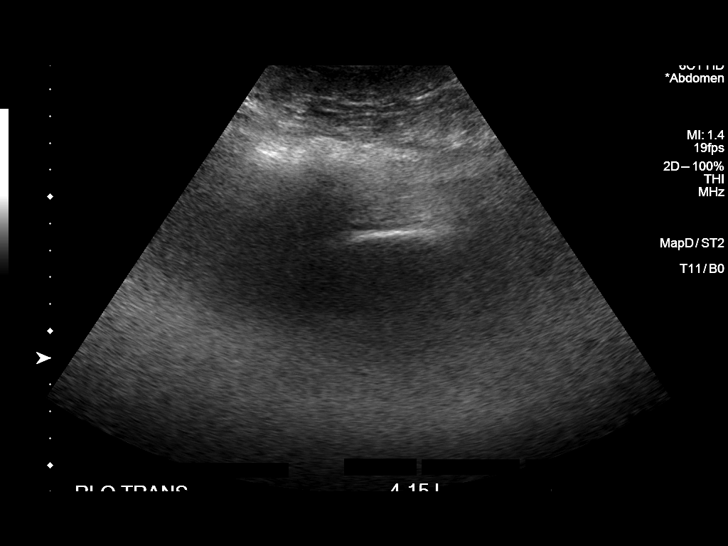

[13 of 13 positions shown; findings below may reference images not displayed]

EXAM:
ULTRASOUND GUIDED RIGHT LOWER QUADRANT PARACENTESIS

MEDICATIONS:
None.

COMPLICATIONS:
None immediate.

PROCEDURE:
Informed written consent was obtained from the patient after a
discussion of the risks, benefits and alternatives to treatment. A
timeout was performed prior to the initiation of the procedure.

Initial ultrasound scanning demonstrates a large amount of ascites
within the right lower abdominal quadrant. The right lower abdomen
was prepped and draped in the usual sterile fashion. 1% lidocaine
with epinephrine was used for local anesthesia.

Following this, a Safe-T-Centesis catheter was introduced. An
ultrasound image was saved for documentation purposes. The
paracentesis was performed. The catheter was removed and a dressing
was applied. The patient tolerated the procedure well without
immediate post procedural complication.
FINDINGS: A total of approximately 4.15 L of clear yellow fluid was removed.
IMPRESSION: Successful ultrasound-guided paracentesis yielding 4.15 liters of
peritoneal fluid.

## 2018-01-02 IMAGING — CR DG CHEST 2V
2 series · 2 of 2 positions shown · non-contrast
Comparison: 01/25/2017

CLINICAL DATA: Shortness of Breath

EXAM:
CHEST  2 VIEW

[chest lat]
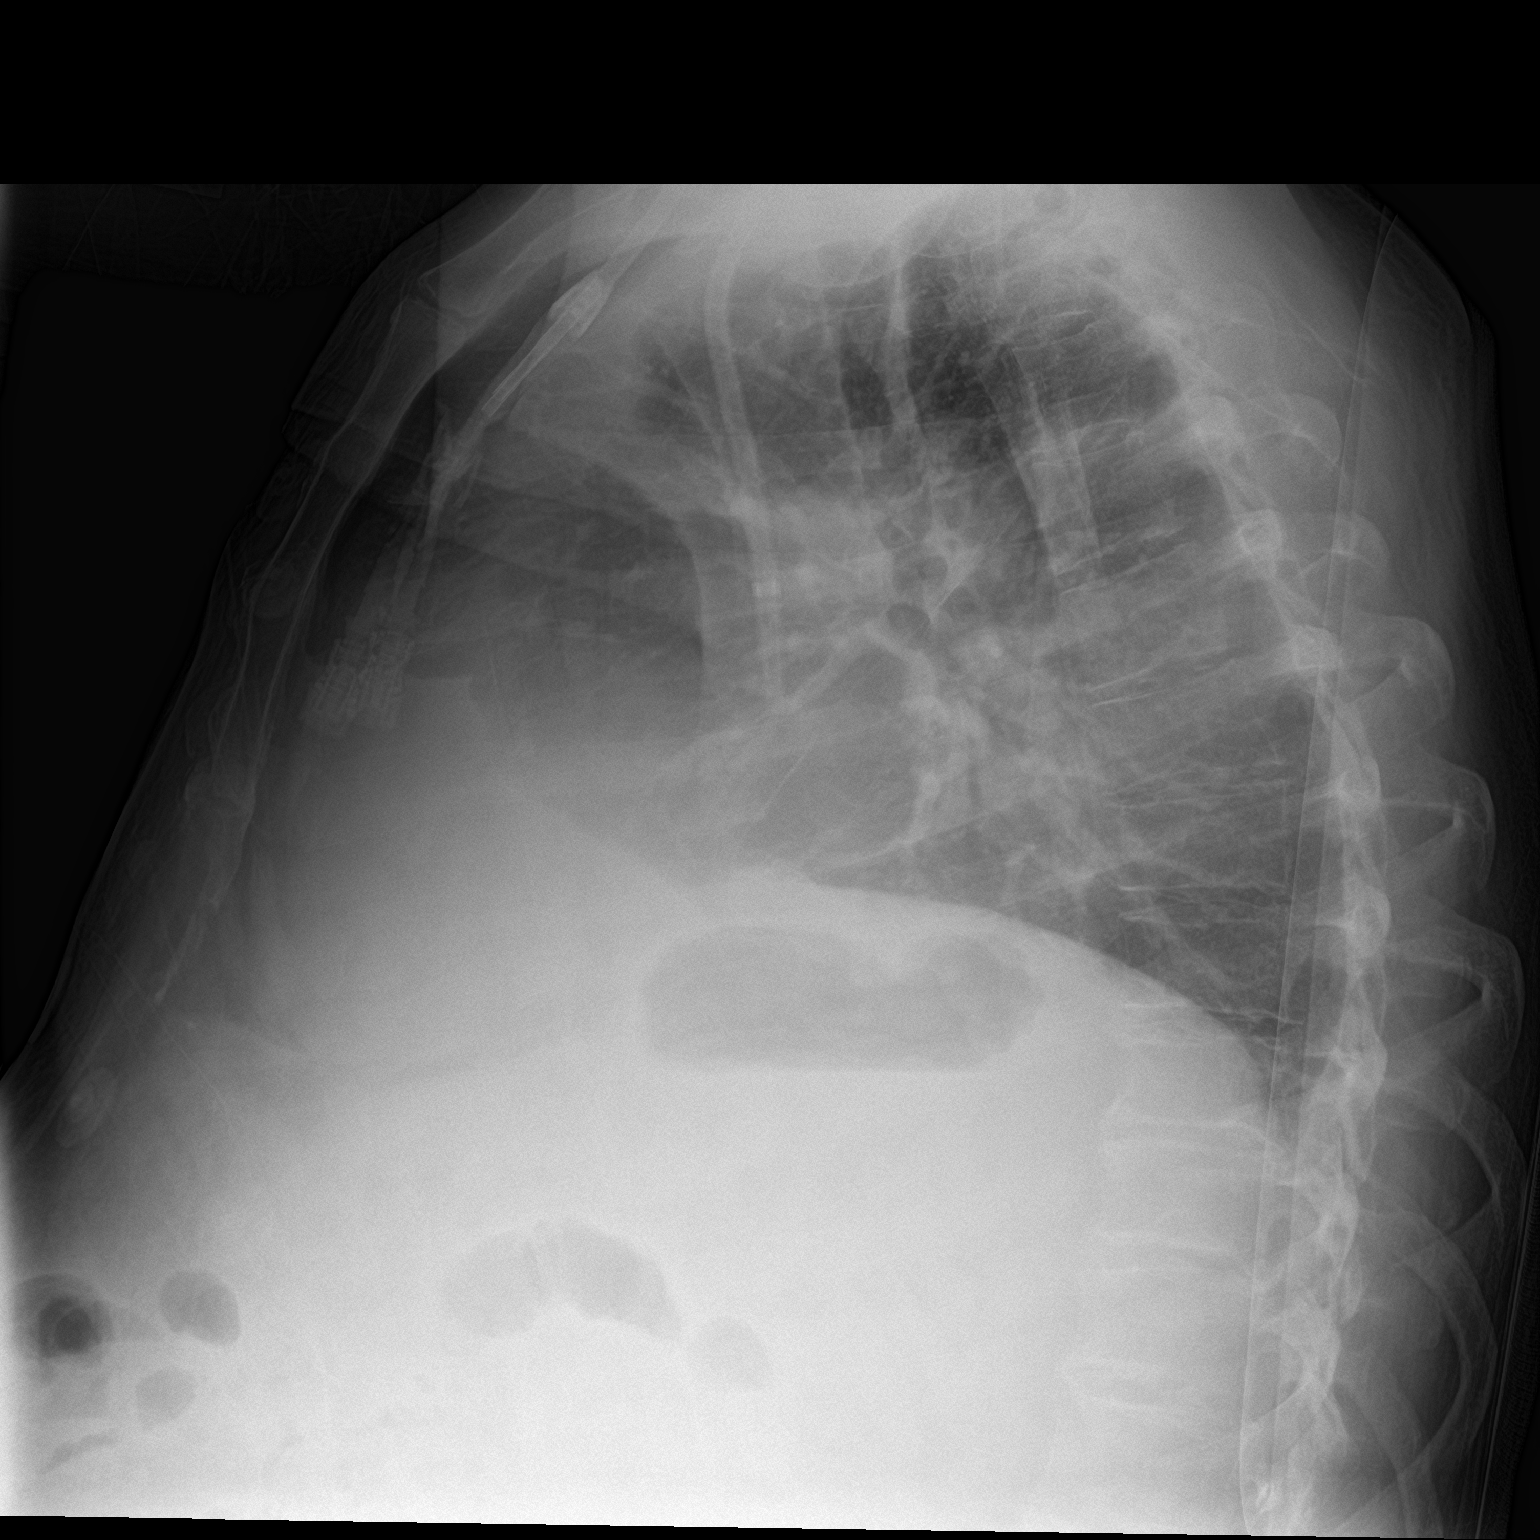

[chest ap]
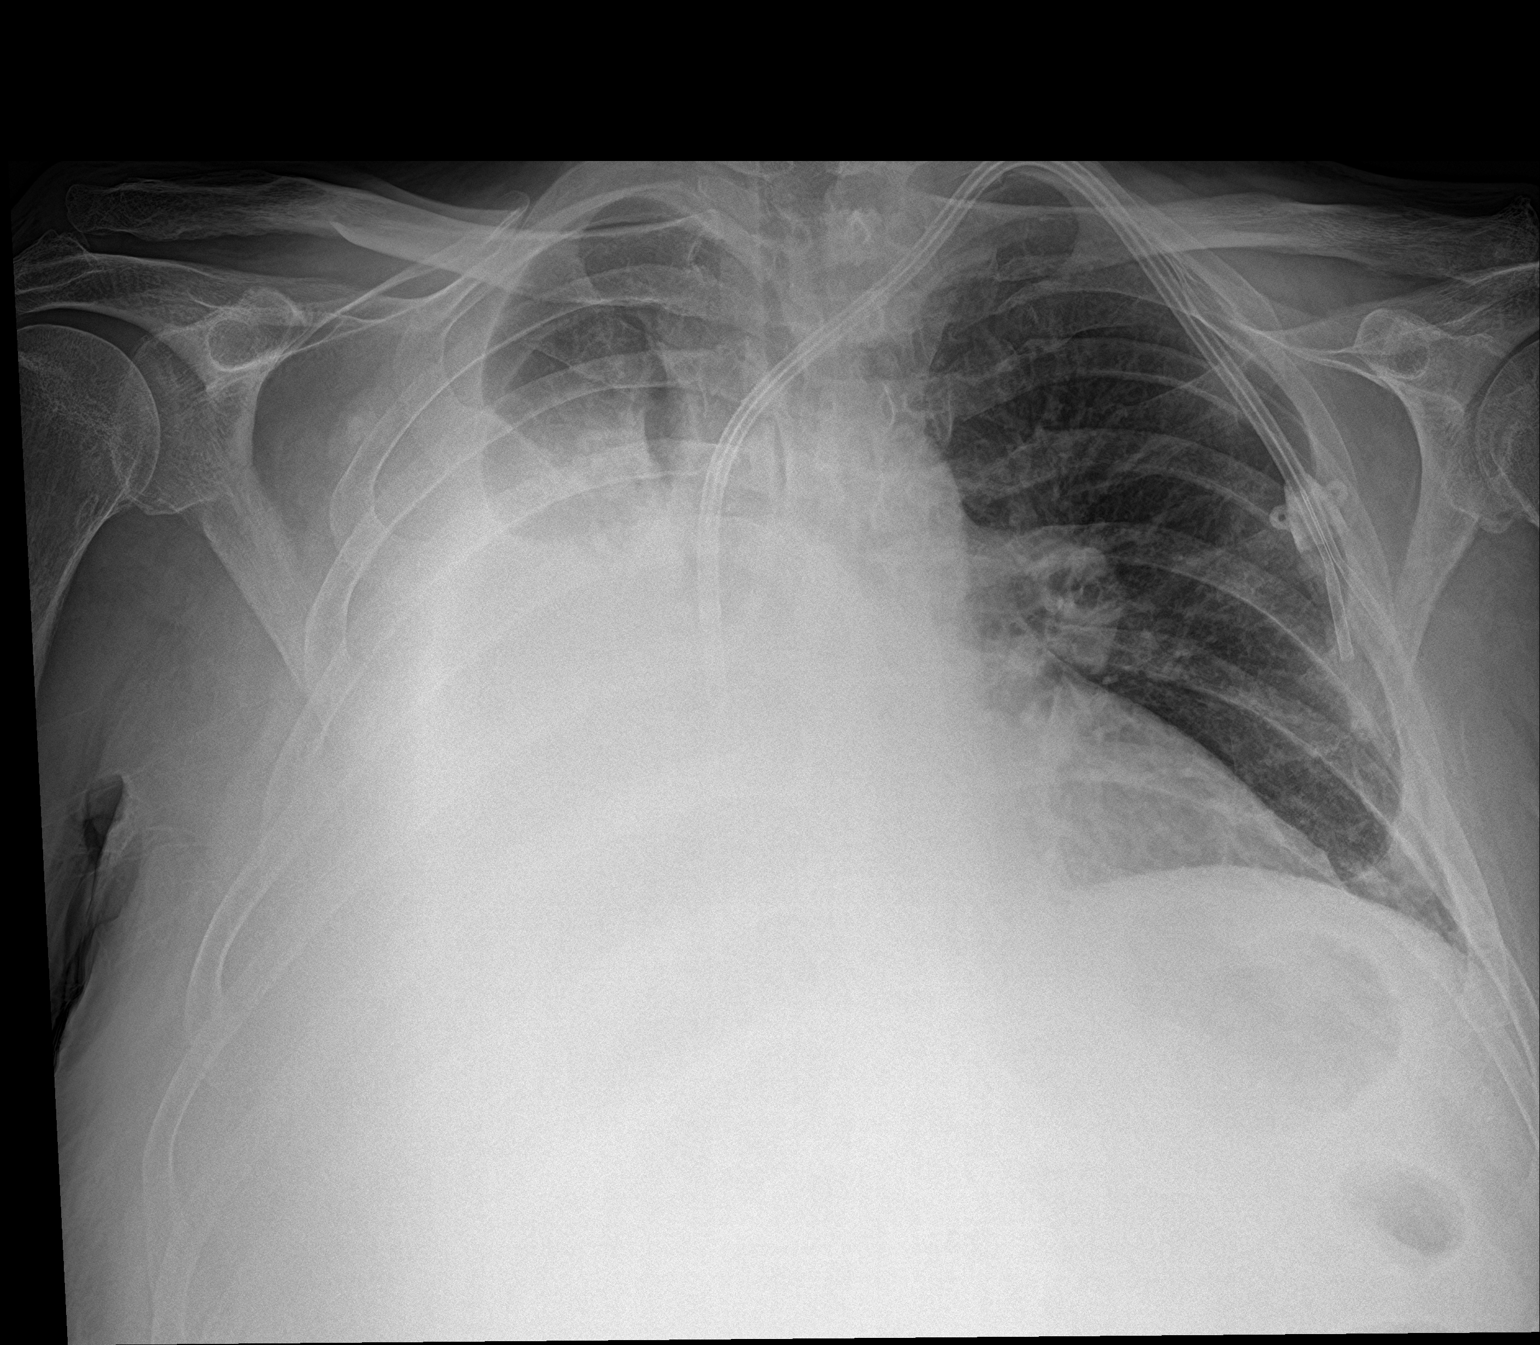

[2 of 2 positions shown; findings below may reference images not displayed]

FINDINGS: Cardiac shadow is stable. Dialysis catheter is again seen in
satisfactory position. The left lung remains clear. Persistent and
enlarging right pleural effusion is noted. No bony abnormality is
seen.
IMPRESSION: Persistent right-sided pleural effusion with increase when compared
with the prior study.

## 2018-01-03 IMAGING — CR DG CHEST 2V
2 series · 2 of 2 positions shown · non-contrast
Comparison: Chest radiograph yesterday at 0746 hours

CLINICAL DATA: Shortness of breath.

EXAM:
CHEST  2 VIEW

[chest lat]
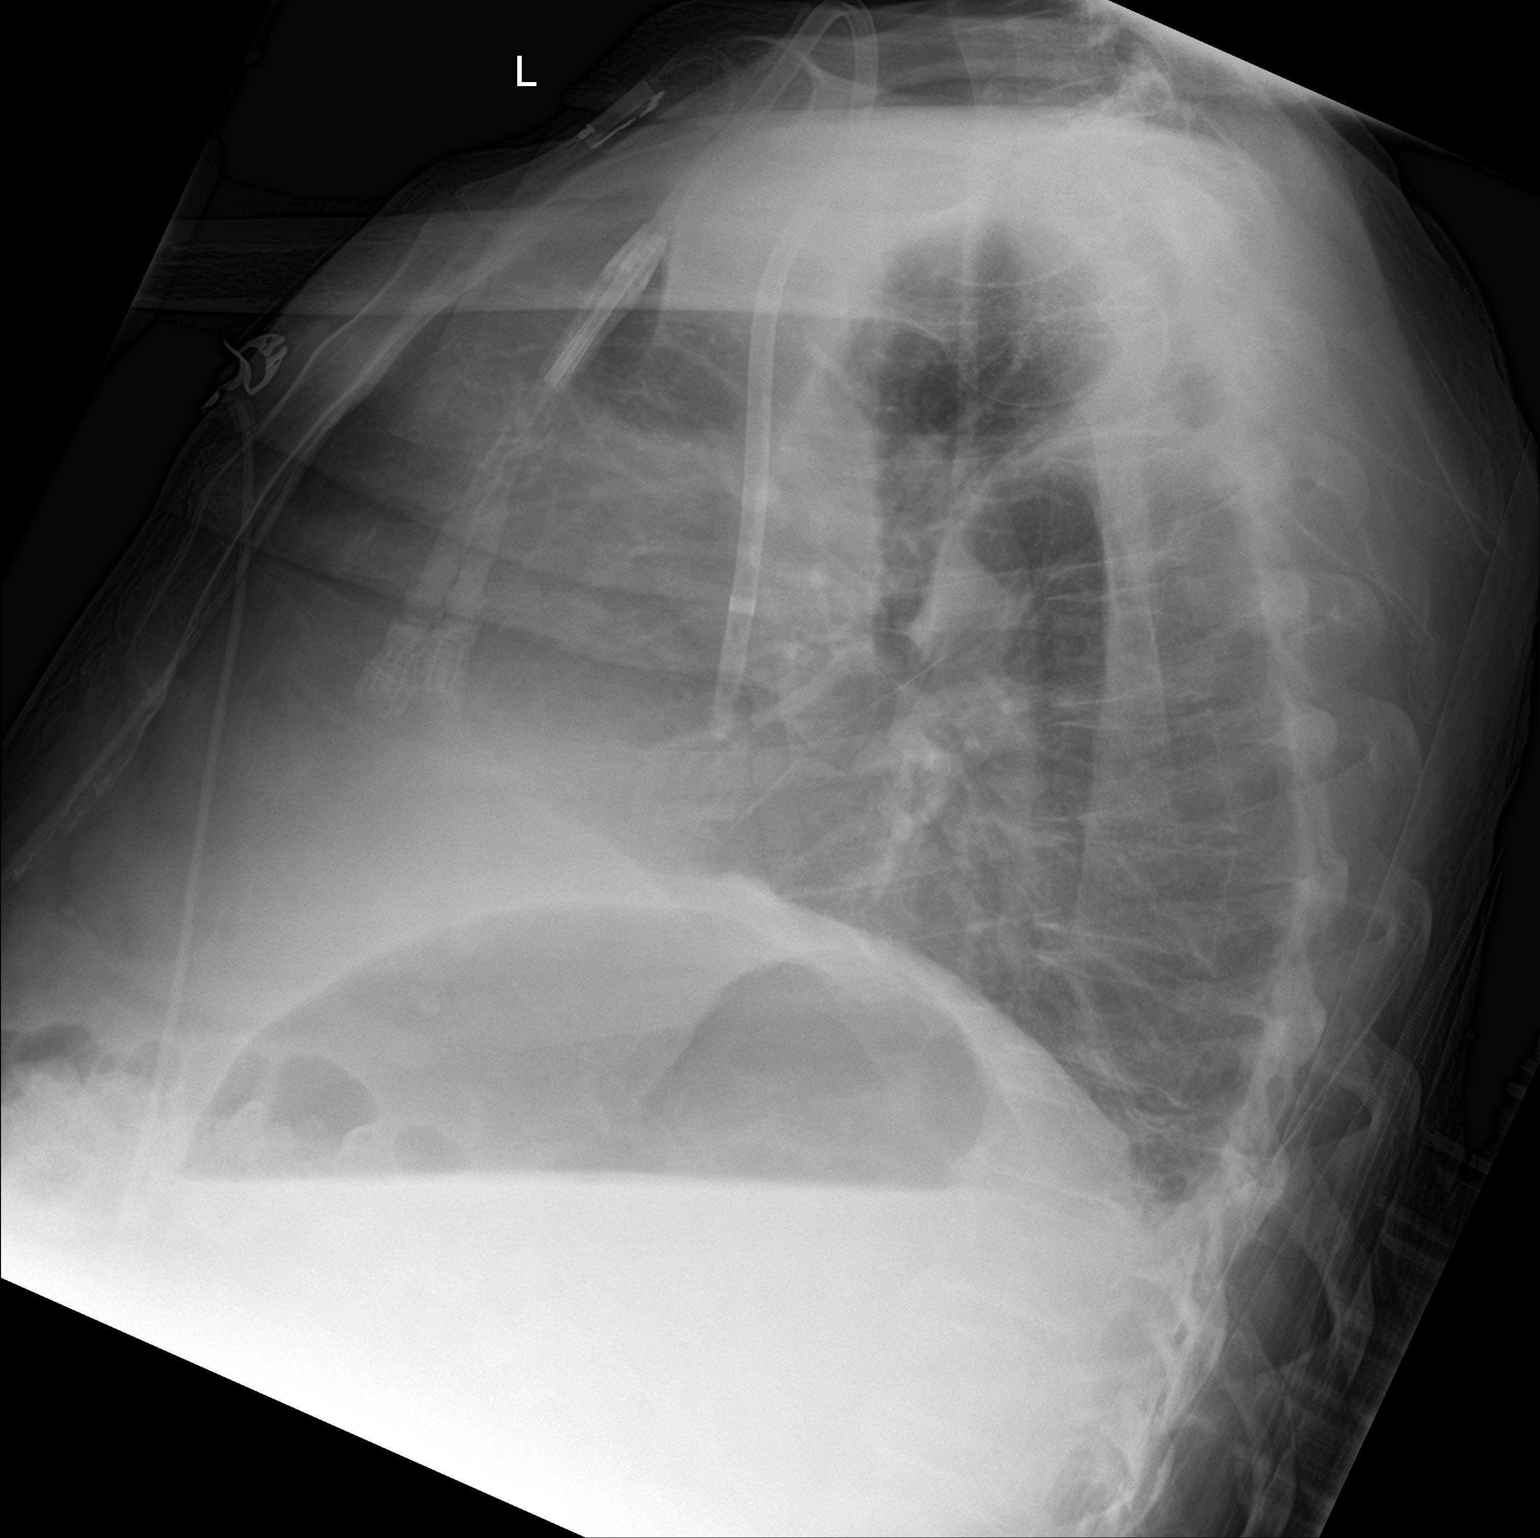

[chest ap]
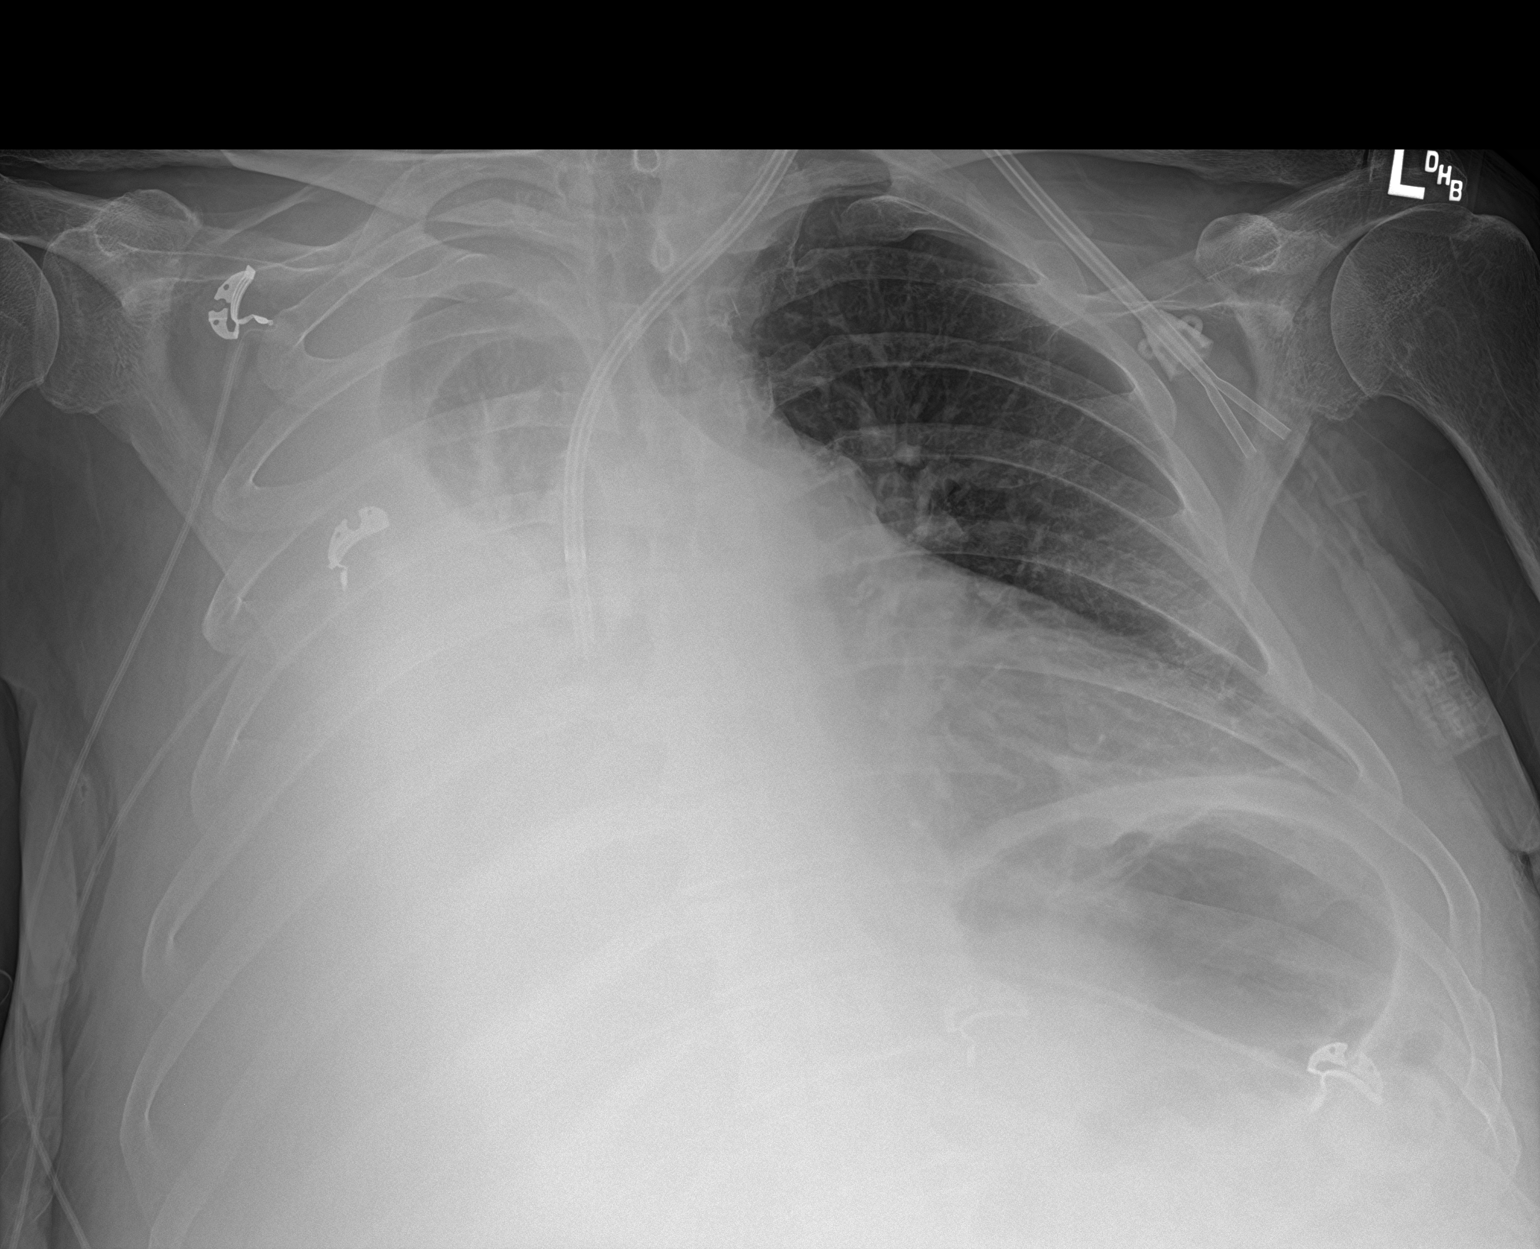

[2 of 2 positions shown; findings below may reference images not displayed]

FINDINGS: Left internal jugular dialysis catheter tip remains in the distal
SVC. Large right pleural effusion with only minimal aerated right
perihilar lung, questionable progression from prior radiograph. Left
lung is clear. Mediastinal contours are obscured secondary to right
hemithorax calcification. No pneumothorax.
IMPRESSION: Large right pleural effusion with only minimal aerated right
perihilar lung. Possible increase in pleural fluid from chest
radiograph yesterday.
